# Patient Record
Sex: Male | Born: 1935
Health system: Southern US, Community
[De-identification: ages and names within clinical notes are randomized; demographics above are authoritative.]

## PROBLEM LIST (undated history)

## (undated) DIAGNOSIS — I498 Other specified cardiac arrhythmias: Secondary | ICD-10-CM

## (undated) DIAGNOSIS — C61 Malignant neoplasm of prostate: Secondary | ICD-10-CM

## (undated) DIAGNOSIS — I712 Thoracic aortic aneurysm, without rupture, unspecified: Secondary | ICD-10-CM

## (undated) DIAGNOSIS — I480 Paroxysmal atrial fibrillation: Secondary | ICD-10-CM

## (undated) DIAGNOSIS — C189 Malignant neoplasm of colon, unspecified: Secondary | ICD-10-CM

## (undated) DIAGNOSIS — I340 Nonrheumatic mitral (valve) insufficiency: Secondary | ICD-10-CM

## (undated) DIAGNOSIS — K469 Unspecified abdominal hernia without obstruction or gangrene: Secondary | ICD-10-CM

## (undated) DIAGNOSIS — H409 Unspecified glaucoma: Secondary | ICD-10-CM

## (undated) DIAGNOSIS — N183 Chronic kidney disease, stage 3 unspecified: Secondary | ICD-10-CM

## (undated) DIAGNOSIS — I10 Essential (primary) hypertension: Secondary | ICD-10-CM

## (undated) DIAGNOSIS — I493 Ventricular premature depolarization: Secondary | ICD-10-CM

## (undated) DIAGNOSIS — J392 Other diseases of pharynx: Secondary | ICD-10-CM

## (undated) DIAGNOSIS — D696 Thrombocytopenia, unspecified: Secondary | ICD-10-CM

## (undated) DIAGNOSIS — I2510 Atherosclerotic heart disease of native coronary artery without angina pectoris: Secondary | ICD-10-CM

## (undated) DIAGNOSIS — I441 Atrioventricular block, second degree: Secondary | ICD-10-CM

## (undated) DIAGNOSIS — I341 Nonrheumatic mitral (valve) prolapse: Secondary | ICD-10-CM

## (undated) DIAGNOSIS — M1990 Unspecified osteoarthritis, unspecified site: Secondary | ICD-10-CM

## (undated) DIAGNOSIS — I351 Nonrheumatic aortic (valve) insufficiency: Secondary | ICD-10-CM

## (undated) DIAGNOSIS — F329 Major depressive disorder, single episode, unspecified: Secondary | ICD-10-CM

## (undated) DIAGNOSIS — T8859XA Other complications of anesthesia, initial encounter: Secondary | ICD-10-CM

## (undated) DIAGNOSIS — T4145XA Adverse effect of unspecified anesthetic, initial encounter: Secondary | ICD-10-CM

## (undated) DIAGNOSIS — I491 Atrial premature depolarization: Secondary | ICD-10-CM

## (undated) DIAGNOSIS — I4510 Unspecified right bundle-branch block: Secondary | ICD-10-CM

## (undated) DIAGNOSIS — C320 Malignant neoplasm of glottis: Secondary | ICD-10-CM

## (undated) DIAGNOSIS — I6782 Cerebral ischemia: Secondary | ICD-10-CM

## (undated) DIAGNOSIS — I440 Atrioventricular block, first degree: Secondary | ICD-10-CM

## (undated) DIAGNOSIS — G479 Sleep disorder, unspecified: Secondary | ICD-10-CM

## (undated) DIAGNOSIS — G3184 Mild cognitive impairment, so stated: Secondary | ICD-10-CM

## (undated) DIAGNOSIS — E785 Hyperlipidemia, unspecified: Secondary | ICD-10-CM

## (undated) HISTORY — DX: Ventricular premature depolarization: I49.3

## (undated) HISTORY — DX: Paroxysmal atrial fibrillation: I48.0

## (undated) HISTORY — DX: Unspecified right bundle-branch block: I45.10

## (undated) HISTORY — DX: Unspecified abdominal hernia without obstruction or gangrene: K46.9

## (undated) HISTORY — DX: Hyperlipidemia, unspecified: E78.5

## (undated) HISTORY — PX: JOINT REPLACEMENT: SHX530

## (undated) HISTORY — DX: Other specified cardiac arrhythmias: I49.8

## (undated) HISTORY — DX: Nonrheumatic mitral (valve) prolapse: I34.1

## (undated) HISTORY — DX: Thoracic aortic aneurysm, without rupture: I71.2

## (undated) HISTORY — DX: Thrombocytopenia, unspecified: D69.6

## (undated) HISTORY — DX: Malignant neoplasm of prostate: C61

## (undated) HISTORY — DX: Atrial premature depolarization: I49.1

## (undated) HISTORY — DX: Nonrheumatic aortic (valve) insufficiency: I35.1

## (undated) HISTORY — DX: Thoracic aortic aneurysm, without rupture, unspecified: I71.20

## (undated) HISTORY — DX: Cerebral ischemia: I67.82

## (undated) HISTORY — PX: GASTROSTOMY W/ FEEDING TUBE: SUR642

## (undated) HISTORY — DX: Unspecified glaucoma: H40.9

## (undated) HISTORY — DX: Major depressive disorder, single episode, unspecified: F32.9

## (undated) HISTORY — DX: Mild cognitive impairment of uncertain or unknown etiology: G31.84

## (undated) HISTORY — DX: Atrioventricular block, second degree: I44.1

## (undated) HISTORY — DX: Atrioventricular block, first degree: I44.0

## (undated) HISTORY — DX: Chronic kidney disease, stage 3 (moderate): N18.3

## (undated) HISTORY — DX: Sleep disorder, unspecified: G47.9

## (undated) HISTORY — PX: PROSTATE BIOPSY: SHX241

## (undated) HISTORY — DX: Atherosclerotic heart disease of native coronary artery without angina pectoris: I25.10

## (undated) HISTORY — DX: Chronic kidney disease, stage 3 unspecified: N18.30

## (undated) HISTORY — PX: CATARACT EXTRACTION W/ INTRAOCULAR LENS  IMPLANT, BILATERAL: SHX1307

## (undated) HISTORY — DX: Essential (primary) hypertension: I10

## (undated) HISTORY — DX: Malignant neoplasm of colon, unspecified: C18.9

## (undated) HISTORY — DX: Nonrheumatic mitral (valve) insufficiency: I34.0

---

## 1965-08-10 HISTORY — PX: TYMPANOPLASTY: SHX33

## 1998-02-15 ENCOUNTER — Ambulatory Visit (HOSPITAL_COMMUNITY): Admission: RE | Admit: 1998-02-15 | Discharge: 1998-02-15 | Payer: Self-pay | Admitting: Orthopedic Surgery

## 2000-11-29 ENCOUNTER — Encounter: Payer: Self-pay | Admitting: Orthopedic Surgery

## 2000-11-29 ENCOUNTER — Inpatient Hospital Stay (HOSPITAL_COMMUNITY): Admission: RE | Admit: 2000-11-29 | Discharge: 2000-12-02 | Payer: Self-pay | Admitting: Orthopedic Surgery

## 2001-08-10 HISTORY — PX: TOTAL KNEE ARTHROPLASTY: SHX125

## 2002-08-10 HISTORY — PX: COLECTOMY: SHX59

## 2003-03-26 ENCOUNTER — Ambulatory Visit (HOSPITAL_COMMUNITY): Admission: RE | Admit: 2003-03-26 | Discharge: 2003-03-26 | Payer: Self-pay | Admitting: Gastroenterology

## 2003-03-26 ENCOUNTER — Encounter (INDEPENDENT_AMBULATORY_CARE_PROVIDER_SITE_OTHER): Payer: Self-pay | Admitting: Specialist

## 2003-04-05 ENCOUNTER — Encounter: Payer: Self-pay | Admitting: General Surgery

## 2003-04-05 ENCOUNTER — Encounter: Admission: RE | Admit: 2003-04-05 | Discharge: 2003-04-13 | Payer: Self-pay | Admitting: General Surgery

## 2003-04-05 ENCOUNTER — Encounter: Admission: RE | Admit: 2003-04-05 | Discharge: 2003-04-05 | Payer: Self-pay | Admitting: General Surgery

## 2003-04-09 ENCOUNTER — Encounter (INDEPENDENT_AMBULATORY_CARE_PROVIDER_SITE_OTHER): Payer: Self-pay

## 2003-04-09 ENCOUNTER — Inpatient Hospital Stay (HOSPITAL_COMMUNITY): Admission: RE | Admit: 2003-04-09 | Discharge: 2003-04-13 | Payer: Self-pay | Admitting: General Surgery

## 2003-06-03 ENCOUNTER — Ambulatory Visit (HOSPITAL_COMMUNITY): Admission: RE | Admit: 2003-06-03 | Discharge: 2003-06-03 | Payer: Self-pay | Admitting: General Surgery

## 2003-06-03 ENCOUNTER — Encounter: Payer: Self-pay | Admitting: General Surgery

## 2006-10-05 ENCOUNTER — Ambulatory Visit: Payer: Self-pay | Admitting: *Deleted

## 2008-11-06 ENCOUNTER — Encounter (INDEPENDENT_AMBULATORY_CARE_PROVIDER_SITE_OTHER): Payer: Self-pay | Admitting: Cardiology

## 2008-11-06 ENCOUNTER — Ambulatory Visit (HOSPITAL_COMMUNITY): Admission: RE | Admit: 2008-11-06 | Discharge: 2008-11-06 | Payer: Self-pay | Admitting: Cardiology

## 2008-12-06 ENCOUNTER — Ambulatory Visit: Payer: Self-pay | Admitting: Thoracic Surgery (Cardiothoracic Vascular Surgery)

## 2008-12-12 ENCOUNTER — Encounter
Admission: RE | Admit: 2008-12-12 | Discharge: 2008-12-12 | Payer: Self-pay | Admitting: Thoracic Surgery (Cardiothoracic Vascular Surgery)

## 2008-12-24 ENCOUNTER — Ambulatory Visit: Payer: Self-pay | Admitting: Thoracic Surgery (Cardiothoracic Vascular Surgery)

## 2008-12-27 HISTORY — PX: MITRAL VALVE REPAIR: SHX2039

## 2008-12-27 HISTORY — PX: MAZE: SHX5063

## 2009-01-02 ENCOUNTER — Encounter: Payer: Self-pay | Admitting: Thoracic Surgery (Cardiothoracic Vascular Surgery)

## 2009-01-04 ENCOUNTER — Encounter: Payer: Self-pay | Admitting: Thoracic Surgery (Cardiothoracic Vascular Surgery)

## 2009-01-04 ENCOUNTER — Ambulatory Visit: Payer: Self-pay | Admitting: Thoracic Surgery (Cardiothoracic Vascular Surgery)

## 2009-01-04 ENCOUNTER — Inpatient Hospital Stay (HOSPITAL_COMMUNITY)
Admission: RE | Admit: 2009-01-04 | Discharge: 2009-01-08 | Payer: Self-pay | Admitting: Thoracic Surgery (Cardiothoracic Vascular Surgery)

## 2009-01-06 ENCOUNTER — Ambulatory Visit (HOSPITAL_COMMUNITY)
Admission: RE | Admit: 2009-01-06 | Discharge: 2009-01-06 | Payer: Self-pay | Admitting: Thoracic Surgery (Cardiothoracic Vascular Surgery)

## 2009-01-14 ENCOUNTER — Encounter
Admission: RE | Admit: 2009-01-14 | Discharge: 2009-01-14 | Payer: Self-pay | Admitting: Thoracic Surgery (Cardiothoracic Vascular Surgery)

## 2009-01-14 ENCOUNTER — Ambulatory Visit: Payer: Self-pay | Admitting: Thoracic Surgery (Cardiothoracic Vascular Surgery)

## 2009-02-01 ENCOUNTER — Ambulatory Visit: Payer: Self-pay | Admitting: Thoracic Surgery (Cardiothoracic Vascular Surgery)

## 2009-04-01 ENCOUNTER — Ambulatory Visit: Payer: Self-pay | Admitting: Thoracic Surgery (Cardiothoracic Vascular Surgery)

## 2009-06-17 ENCOUNTER — Ambulatory Visit: Payer: Self-pay | Admitting: Thoracic Surgery (Cardiothoracic Vascular Surgery)

## 2009-12-16 ENCOUNTER — Ambulatory Visit: Payer: Self-pay | Admitting: Thoracic Surgery (Cardiothoracic Vascular Surgery)

## 2010-06-02 ENCOUNTER — Ambulatory Visit: Payer: Self-pay | Admitting: Thoracic Surgery (Cardiothoracic Vascular Surgery)

## 2010-08-30 ENCOUNTER — Encounter: Payer: Self-pay | Admitting: General Surgery

## 2010-11-17 LAB — PROTIME-INR
INR: 1 (ref 0.00–1.49)
Prothrombin Time: 13.8 seconds (ref 11.6–15.2)

## 2010-11-17 LAB — BASIC METABOLIC PANEL
Chloride: 103 mEq/L (ref 96–112)
GFR calc non Af Amer: 58 mL/min — ABNORMAL LOW (ref >60)
Potassium: 4.6 mEq/L (ref 3.5–5.1)
Sodium: 136 mEq/L (ref 135–145)

## 2010-11-18 LAB — URINALYSIS, ROUTINE W REFLEX MICROSCOPIC
Glucose, UA: NEGATIVE mg/dL
Protein, ur: NEGATIVE mg/dL
Urobilinogen, UA: 1 mg/dL (ref 0.0–1.0)

## 2010-11-18 LAB — POCT I-STAT 4, (NA,K, GLUC, HGB,HCT)
Glucose, Bld: 119 mg/dL — ABNORMAL HIGH (ref 70–99)
Glucose, Bld: 121 mg/dL — ABNORMAL HIGH (ref 70–99)
Glucose, Bld: 124 mg/dL — ABNORMAL HIGH (ref 70–99)
Glucose, Bld: 132 mg/dL — ABNORMAL HIGH (ref 70–99)
Glucose, Bld: 132 mg/dL — ABNORMAL HIGH (ref 70–99)
Glucose, Bld: 149 mg/dL — ABNORMAL HIGH (ref 70–99)
Glucose, Bld: 62 mg/dL — ABNORMAL LOW (ref 70–99)
Glucose, Bld: 96 mg/dL (ref 70–99)
HCT: 26 % — ABNORMAL LOW (ref 39.0–52.0)
HCT: 26 % — ABNORMAL LOW (ref 39.0–52.0)
HCT: 27 % — ABNORMAL LOW (ref 39.0–52.0)
HCT: 30 % — ABNORMAL LOW (ref 39.0–52.0)
HCT: 38 % — ABNORMAL LOW (ref 39.0–52.0)
Hemoglobin: 10.2 g/dL — ABNORMAL LOW (ref 13.0–17.0)
Hemoglobin: 12.9 g/dL — ABNORMAL LOW (ref 13.0–17.0)
Hemoglobin: 8.8 g/dL — ABNORMAL LOW (ref 13.0–17.0)
Hemoglobin: 8.8 g/dL — ABNORMAL LOW (ref 13.0–17.0)
Hemoglobin: 9.2 g/dL — ABNORMAL LOW (ref 13.0–17.0)
Potassium: 3.4 mEq/L — ABNORMAL LOW (ref 3.5–5.1)
Potassium: 3.9 meq/L (ref 3.5–5.1)
Potassium: 4.3 mEq/L (ref 3.5–5.1)
Potassium: 4.4 mEq/L (ref 3.5–5.1)
Potassium: 4.4 mEq/L (ref 3.5–5.1)
Potassium: 4.6 mEq/L (ref 3.5–5.1)
Potassium: 5.2 mEq/L — ABNORMAL HIGH (ref 3.5–5.1)
Sodium: 134 mEq/L — ABNORMAL LOW (ref 135–145)
Sodium: 137 mEq/L (ref 135–145)
Sodium: 139 mEq/L (ref 135–145)
Sodium: 139 mEq/L (ref 135–145)
Sodium: 142 meq/L (ref 135–145)
Sodium: 143 mEq/L (ref 135–145)

## 2010-11-18 LAB — PROTIME-INR
INR: 1.5 (ref 0.00–1.49)
Prothrombin Time: 13 seconds (ref 11.6–15.2)
Prothrombin Time: 14.6 seconds (ref 11.6–15.2)
Prothrombin Time: 17.8 seconds — ABNORMAL HIGH (ref 11.6–15.2)

## 2010-11-18 LAB — POCT I-STAT 3, ART BLOOD GAS (G3+)
Acid-Base Excess: 3 mmol/L — ABNORMAL HIGH (ref 0.0–2.0)
Acid-base deficit: 1 mmol/L (ref 0.0–2.0)
Acid-base deficit: 4 mmol/L — ABNORMAL HIGH (ref 0.0–2.0)
Bicarbonate: 23.7 mEq/L (ref 20.0–24.0)
Bicarbonate: 28.4 mEq/L — ABNORMAL HIGH (ref 20.0–24.0)
O2 Saturation: 100 %
O2 Saturation: 100 %
O2 Saturation: 86 %
TCO2: 21 mmol/L (ref 0–100)
TCO2: 27 mmol/L (ref 0–100)
TCO2: 30 mmol/L (ref 0–100)
pCO2 arterial: 29.3 mmHg — ABNORMAL LOW (ref 35.0–45.0)
pCO2 arterial: 42.5 mmHg (ref 35.0–45.0)
pCO2 arterial: 48.5 mmHg — ABNORMAL HIGH (ref 35.0–45.0)
pH, Arterial: 7.386 (ref 7.350–7.450)
pO2, Arterial: 256 mmHg — ABNORMAL HIGH (ref 80.0–100.0)
pO2, Arterial: 275 mmHg — ABNORMAL HIGH (ref 80.0–100.0)
pO2, Arterial: 313 mmHg — ABNORMAL HIGH (ref 80.0–100.0)
pO2, Arterial: 83 mmHg (ref 80.0–100.0)

## 2010-11-18 LAB — PREPARE PLATELETS

## 2010-11-18 LAB — COMPREHENSIVE METABOLIC PANEL WITH GFR
ALT: 24 U/L (ref 0–53)
AST: 28 U/L (ref 0–37)
Albumin: 3.9 g/dL (ref 3.5–5.2)
Alkaline Phosphatase: 68 U/L (ref 39–117)
BUN: 12 mg/dL (ref 6–23)
CO2: 20 meq/L (ref 19–32)
Calcium: 9.4 mg/dL (ref 8.4–10.5)
Chloride: 112 meq/L (ref 96–112)
Creatinine, Ser: 1.11 mg/dL (ref 0.4–1.5)
GFR calc Af Amer: >60 mL/min (ref >60)
GFR calc non Af Amer: >60 mL/min (ref >60)
Glucose, Bld: 110 mg/dL — ABNORMAL HIGH (ref 70–99)
Potassium: 4.4 meq/L (ref 3.5–5.1)
Sodium: 140 meq/L (ref 135–145)
Total Bilirubin: 1.1 mg/dL (ref 0.3–1.2)
Total Protein: 7.1 g/dL (ref 6.0–8.3)

## 2010-11-18 LAB — CBC
HCT: 31.9 % — ABNORMAL LOW (ref 39.0–52.0)
HCT: 45.7 % (ref 39.0–52.0)
Hemoglobin: 10.7 g/dL — ABNORMAL LOW (ref 13.0–17.0)
Hemoglobin: 11.4 g/dL — ABNORMAL LOW (ref 13.0–17.0)
Hemoglobin: 15.8 g/dL (ref 13.0–17.0)
MCHC: 34.5 g/dL (ref 30.0–36.0)
MCHC: 34.7 g/dL (ref 30.0–36.0)
MCV: 97.4 fL (ref 78.0–100.0)
MCV: 97.8 fL (ref 78.0–100.0)
MCV: 98.1 fL (ref 78.0–100.0)
Platelets: 109 10*3/uL — ABNORMAL LOW (ref 150–400)
Platelets: 166 K/uL (ref 150–400)
Platelets: 94 10*3/uL — ABNORMAL LOW (ref 150–400)
RBC: 3.17 MIL/uL — ABNORMAL LOW (ref 4.22–5.81)
RBC: 3.44 MIL/uL — ABNORMAL LOW (ref 4.22–5.81)
RBC: 4.69 MIL/uL (ref 4.22–5.81)
RDW: 13.3 % (ref 11.5–15.5)
RDW: 13.3 % (ref 11.5–15.5)
RDW: 13.9 % (ref 11.5–15.5)
WBC: 10 10*3/uL (ref 4.0–10.5)
WBC: 10.1 10*3/uL (ref 4.0–10.5)
WBC: 12.8 10*3/uL — ABNORMAL HIGH (ref 4.0–10.5)
WBC: 8.6 K/uL (ref 4.0–10.5)

## 2010-11-18 LAB — POCT I-STAT, CHEM 8
Calcium, Ion: 1.1 mmol/L — ABNORMAL LOW (ref 1.12–1.32)
Chloride: 106 mEq/L (ref 96–112)
Creatinine, Ser: 1.2 mg/dL (ref 0.4–1.5)
Glucose, Bld: 160 mg/dL — ABNORMAL HIGH (ref 70–99)
HCT: 33 % — ABNORMAL LOW (ref 39.0–52.0)
Hemoglobin: 11.2 g/dL — ABNORMAL LOW (ref 13.0–17.0)

## 2010-11-18 LAB — BLOOD GAS, ARTERIAL
Acid-base deficit: 3.2 mmol/L — ABNORMAL HIGH (ref 0.0–2.0)
Bicarbonate: 20.2 meq/L (ref 20.0–24.0)
Drawn by: 181601
FIO2: 0.21 %
O2 Saturation: 98.1 %
Patient temperature: 98.6
TCO2: 21.1 mmol/L (ref 0–100)
pCO2 arterial: 30.1 mmHg — ABNORMAL LOW (ref 35.0–45.0)
pH, Arterial: 7.442 (ref 7.350–7.450)
pO2, Arterial: 94.1 mmHg (ref 80.0–100.0)

## 2010-11-18 LAB — BASIC METABOLIC PANEL
BUN: 15 mg/dL (ref 6–23)
BUN: 17 mg/dL (ref 6–23)
Calcium: 8 mg/dL — ABNORMAL LOW (ref 8.4–10.5)
Calcium: 8.1 mg/dL — ABNORMAL LOW (ref 8.4–10.5)
Chloride: 105 mEq/L (ref 96–112)
Chloride: 106 mEq/L (ref 96–112)
Creatinine, Ser: 1.34 mg/dL (ref 0.4–1.5)
Creatinine, Ser: 1.52 mg/dL — ABNORMAL HIGH (ref 0.4–1.5)
GFR calc Af Amer: >60 mL/min (ref >60)
GFR calc Af Amer: >60 mL/min (ref >60)
GFR calc non Af Amer: >60 mL/min (ref >60)
GFR calc non Af Amer: >60 mL/min (ref >60)
Glucose, Bld: 133 mg/dL — ABNORMAL HIGH (ref 70–99)
Glucose, Bld: 166 mg/dL — ABNORMAL HIGH (ref 70–99)
Potassium: 3.6 mEq/L (ref 3.5–5.1)
Sodium: 136 mEq/L (ref 135–145)
Sodium: 141 mEq/L (ref 135–145)

## 2010-11-18 LAB — PREPARE FRESH FROZEN PLASMA

## 2010-11-18 LAB — GLUCOSE, CAPILLARY
Glucose-Capillary: 105 mg/dL — ABNORMAL HIGH (ref 70–99)
Glucose-Capillary: 133 mg/dL — ABNORMAL HIGH (ref 70–99)
Glucose-Capillary: 137 mg/dL — ABNORMAL HIGH (ref 70–99)
Glucose-Capillary: 137 mg/dL — ABNORMAL HIGH (ref 70–99)
Glucose-Capillary: 138 mg/dL — ABNORMAL HIGH (ref 70–99)
Glucose-Capillary: 144 mg/dL — ABNORMAL HIGH (ref 70–99)
Glucose-Capillary: 167 mg/dL — ABNORMAL HIGH (ref 70–99)

## 2010-11-18 LAB — ABO/RH: ABO/RH(D): O POS

## 2010-11-18 LAB — POCT I-STAT 3, VENOUS BLOOD GAS (G3P V)
Acid-base deficit: 4 mmol/L — ABNORMAL HIGH (ref 0.0–2.0)
Bicarbonate: 22 meq/L (ref 20.0–24.0)
O2 Saturation: 81 %
TCO2: 23 mmol/L (ref 0–100)
pCO2, Ven: 41.4 mmHg — ABNORMAL LOW (ref 45.0–50.0)
pH, Ven: 7.333 — ABNORMAL HIGH (ref 7.250–7.300)
pO2, Ven: 49 mmHg — ABNORMAL HIGH (ref 30.0–45.0)

## 2010-11-18 LAB — APTT: aPTT: 36 seconds (ref 24–37)

## 2010-11-18 LAB — TYPE AND SCREEN
ABO/RH(D): O POS
Antibody Screen: NEGATIVE

## 2010-11-18 LAB — PLATELET COUNT: Platelets: 88 10*3/uL — ABNORMAL LOW (ref 150–400)

## 2010-11-18 LAB — HEMOGLOBIN AND HEMATOCRIT, BLOOD: HCT: 28.3 % — ABNORMAL LOW (ref 39.0–52.0)

## 2010-11-18 LAB — MAGNESIUM: Magnesium: 2.3 mg/dL (ref 1.5–2.5)

## 2010-11-24 ENCOUNTER — Encounter (INDEPENDENT_AMBULATORY_CARE_PROVIDER_SITE_OTHER): Payer: Medicare Other | Admitting: Thoracic Surgery (Cardiothoracic Vascular Surgery)

## 2010-11-24 DIAGNOSIS — I4891 Unspecified atrial fibrillation: Secondary | ICD-10-CM

## 2010-11-24 DIAGNOSIS — I059 Rheumatic mitral valve disease, unspecified: Secondary | ICD-10-CM

## 2010-11-25 NOTE — Assessment & Plan Note (Signed)
OFFICE VISIT  Timothy Suarez, Timothy Suarez DOB:  02/26/1925                                        November 24, 2010 CHART #:  [id number]  HISTORY OF PRESENT ILLNESS:  The patient returns for routine followup now almost 2 years, status post right miniature thoracotomy for mitral valve repair and maze procedure.  He was last seen here in the office on June 02, 2010.  At that time, he was noted to have developed a small chest wall hernia at the site of his mini thoracotomy scar.  He was completely asymptomatic and did not want to pursue it any further at that time.  Since then, the patient has continued to do quite well physically.  He denies any shortness of breath either with activity or at rest.  His activity level is quite good.  He has not had any significant tachy palpitations that he is aware of.  He has not sort a new cardiologist since Dr. Amil Amen left [facility], although he does continue to follow up with his medical doctor, Dr. Gweneth Dimitri.  He has no complaints.  The remainder of his past medical history is unchanged.  CURRENT MEDICATIONS:  Simvastatin, Norvasc, zolpidem, Travatan eye drops, and vitamin D.  PHYSICAL EXAMINATION:  General:  Notable for a well-appearing male. Vital Signs:  Blood pressure 119/73, pulse 88 and regular.  Two channel telemetry rhythm strip demonstrates normal sinus rhythm.  Oxygen saturation is 96% on room air.  Chest:  Examination of the chest reveals clear breath sounds that are symmetrical bilaterally.  No wheezes, rales or rhonchi are noted.  There is an obvious chest wall hernia at the site of the previous mini thoracotomy incision.  I think the size of the defect maybe somewhat larger.  Cardiovascular:  Regular rate and rhythm. No murmurs, rubs or gallops are noted.  Abdomen:  Soft and nontender. Extremities:  Warm and well-perfused.  There is no lower extremity edema.  IMPRESSION:  The patient is doing  exceptionally well from a cardiovascular standpoint.  He appears to be maintaining sinus rhythm. He has developed a chest wall hernia at the site of his previous mini thoracotomy scar, and I think this may have gotten larger over the last 6 months.  He remains completely asymptomatic and this should be without risk for incarceration.  PLAN:  I have encouraged the patient to consider repair of this hernia given the fact that it seems to be enlarging in size.  He is not interested in having surgery at this time and desires to hold off and just continue to watch it.  I have also reminded that he should probably seek long-term followup with a cardiologist so that he can undergo periodic echocardiogram given his history of previous mitral valve repair and maze procedure.  At this point, he is doing quite well.  All of his questions have been addressed.  He will return in 6 months' time or sooner if his chest wall hernia becomes more problematic, larger in size, or associated with pain.  Salvatore Decent. Cornelius Moras, M.D. Electronically Signed  CHO/MEDQ  D:  11/24/2010  T:  11/25/2010  Job:  [id number]  cc:   Pam Drown, M.D.

## 2010-12-23 NOTE — Assessment & Plan Note (Signed)
OFFICE VISIT   Timothy Suarez, Timothy Suarez  DOB:  [birth year]                                        June 17, 2009  CHART #:  [id number]   HISTORY OF PRESENT ILLNESS:  The patient returns to the office today for  routine follow up now nearly 6 months status post right miniature  thoracotomy for mitral valve repair and Cox CryoMaze procedure.  He was  last seen here in the office on April 01, 2009.  Since then, he has  continued to do well, although he still complains of intermittent dizzy  spells.  He states that these seem to be more problematic when he is  more physically active, and he particularly notices it when he stands up  suddenly after being recumbent for prolonged period of time.  He has  never had any syncopal episodes.  He never had any chest pain or  shortness of breath.  He has never had any transient unilateral numbness  or weakness or visual disturbances.  Since his last visit, he underwent  an outpatient Holter monitor that revealed no signs of any atrial  arrhythmias and a remarkably stable heart rate with normal variation  during the day.  Followup echocardiogram performed on April 25, 2009, revealed normal left ventricular systolic function with ejection  fraction estimated 55-60%.  The mitral valve was functioning normally  with only mild mitral regurgitation and no significant mitral stenosis.  No other abnormalities were noted.  Since then, the patient has been  taken off Coumadin.  Overall, he is getting along fairly well.  He does  report that his dizzy spells seem to predate the surgery that he had  last spring, but he does note that they been increased in severity since  then.  He otherwise feels fine.  His appetite is normal.  He is sleeping  well at night.  He has no shortness of breath.  The remainder of his  review of systems is unremarkable.   CURRENT MEDICATIONS:  1. Sular 10 mg daily.  2. Aspirin 325 mg daily.  3.  Simvastatin 80 mg daily.  4. Ambien as needed for sleep.   PHYSICAL EXAMINATION:  Notable for well-appearing male with blood  pressure 125/80, pulse 76.  Two-channel telemetry rhythm strip  demonstrates normal sinus rhythm.  Examination of the chest reveals  clear breath sounds which are symmetrical bilaterally.  No wheezes or  rhonchi are noted.  Cardiovascular exam includes regular rate and  rhythm.  No murmurs, rubs, or gallops are appreciated.  The abdomen is  soft, nontender.  The extremities are warm and well perfused.  There is  no lower extremity edema.  The remainder of his physical exam is  unrevealing.   IMPRESSION:  The patient looks quite well and seems to be getting along  fine, although he continues to complain of intermittent dizzy spells.  His recent Holter monitor and echocardiogram both look excellent with no  sign of recurrent atrial arrhythmias and a normal functioning mitral  valve with normal left ventricular function.   PLAN:  I have no further insight as to what the underlying cause of his  intermittent dizzy spells might be.  It may be that a formal neurologic  evaluation would be appropriate, and it seems as though his dizzy spells  are probably not cardiac  in origin.  With respect to his previous mitral  valve surgery and maze procedure, he seems to be doing well.  We will  plan to see him back for further follow up in routine rhythm check in 6  months.   Timothy Suarez, M.D.  Electronically Signed   CHO/MEDQ  D:  06/17/2009  T:  06/18/2009  Job:  [id number]   cc:   Timothy Suarez, M.D.  Timothy Suarez, M.D.  Timothy Suarez, M.D.

## 2010-12-23 NOTE — Assessment & Plan Note (Signed)
OFFICE VISIT   Timothy Suarez, Timothy Suarez  DOB:  1924/12/09                                        February 01, 2009  CHART #:  [id number]   The patient returns to the office today for further followup, status  post right miniature thoracotomy for mitral valve repair and Cox-  CryoMaze procedure on Jan 04, 2009.  He was last seen here in the office  on January 14, 2009.  Since then he has continued to improve.  He does  states that he still has trouble sleeping at night and he still feels  tired and gets tired easily.  However, he otherwise is doing very well.  He has no shortness of breath.  He has no dizzy spells.  He has no  tachypalpitations.  He has had no pain at all and he is not taking any  pain medication.  His appetite is good.  His activity level is fairly  good except for the fact that he states that he gets tired quickly.  His  biggest problem is he is not sleeping at night.  He has not yet been  seen in followup by Dr. Amil Amen.  He has had his prothrombin time  checked and his Coumadin dose monitored.   PHYSICAL EXAMINATION:  GENERAL:  A well-appearing male.  VITAL SIGNS:  Blood pressure 133/81, pulse 83 and regular, and oxygen  saturation 98%.  CHEST:  All of his incisions are healing very nicely.  LUNGS:  Breath sounds are clear to auscultation and symmetrical  bilaterally.  CARDIOVASCULAR:  Regular rate and rhythm.  No murmurs, rubs, or gallops  are noted.  ABDOMEN:  Soft and nontender.  EXTREMITIES:  Warm and well perfused.  There is no lower extremity  edema.   IMPRESSION:  The patient looks quite good.  His biggest complaint is  that he is not sleeping at night and he still gets tired easily.  He has  had no shortness of breath whatsoever and overall he feels well.   PLAN:  I have instructed the patient to go ahead and take Ambien as many  as 2 tablets as needed at bedtime for sleep.  I think that during the  day, he can resume driving an  automobile, I have encouraged him to get  start in the cardiac rehab program.  At this point he has no significant  physical restrictions and he can increase his activity as tolerated.  He  has asked about swimming and I think that would be fine as well.  He has  asked about Coumadin and I suspect that he should remain on Coumadin for  at least another month or two.  After that we can consider stopping  Coumadin if he is maintaining sinus rhythm off of amiodarone.   Salvatore Decent. Cornelius Moras, M.D.  Electronically Signed   CHO/MEDQ  D:  02/01/2009  T:  02/02/2009  Job:  [id number]   cc:   Francisca December, M.D.  Pam Drown, M.D.

## 2010-12-23 NOTE — Consult Note (Signed)
NEW PATIENT CONSULTATION   Timothy Suarez, Timothy Suarez  DOB:  [birth year]                                        December 06, 2008  CHART #:  [id number]   REQUESTING PHYSICIAN:  Francisca December, MD   PRIMARY CARE PHYSICIAN:  Pam Drown, MD   REASON FOR CONSULTATION:  Severe mitral regurgitation.   HISTORY OF PRESENT ILLNESS:  The patient is an 77 year old retired  gentleman from [place] with recently discovered severe mitral  regurgitation.  The patient had previously been found to have mild  aortic insufficiency and nonobstructive coronary artery disease for  which he had been treated medically.  He had been active physically for  most of his life, but over the last couple of years he has suffered from  some progressive exertional fatigue.  He also developed intermittent  episodes of tachy palpitations and ultimately was diagnosed with  paroxysmal atrial fibrillation.  The patient recently developed  exertional shortness of breath.  He was seen in followup by Dr. Corliss Blacker  and noted to have a new murmur on physical exam.  A followup  echocardiogram was performed demonstrating severe mitral regurgitation.  He was referred to Dr. Amil Amen, and a transesophageal echocardiogram was  performed on November 06, 2008.  This revealed normal left ventricular size  and systolic function with ejection fraction estimated 60%.  There was  mitral valve prolapse with an eccentric jet of severe mitral  regurgitation directed into the left atrial appendage and associated  flail segment of the middle scallop of the posterior leaflet of the  mitral valve.  There remained mild-to-moderate aortic regurgitation.  No  other abnormalities were noted.  The patient subsequently underwent left  and right heart catheterization on October 31, 2008, by Dr. Amil Amen.  This  revealed single-vessel coronary artery disease with calcified stenosis  of the mid left anterior descending coronary artery and  otherwise no  significant coronary artery disease.  Pulmonary artery pressures were  mildly elevated at 29/8, but on wedge there was a large V-wave of 35  mmHg.  There was only mild aortic regurgitation but severe mitral  regurgitation.  No other abnormalities were noted.  The patient has been  referred to consider minimally invasive mitral valve repair.  Of note,  following completion of his transesophageal echocardiogram, he did have  an episode of paroxysmal atrial fibrillation that terminated  spontaneously.   REVIEW OF SYSTEMS:  GENERAL:  The patient reports normal appetite and  stable weight.  He is 6 feet tall and weighs 210 pounds.  He does  complain of progressive exertional fatigue over the last 2 years or so.  CARDIAC:  Notable for the absence of any history of chest pain, chest  tightness, chest pressure either with activity or at rest.  The patient  has recently developed exertional shortness of breath.  He has had  intermittent episodes of tachy palpitations including one that occurred  just after transesophageal echocardiogram several weeks ago which was  captured and notably paroxysmal atrial fibrillation.  He thinks that he  has had 3 or 4 similar episodes of tachy palpitations in the past.  The  patient denies PND, orthopnea, or lower extremity edema.  RESPIRATORY:  Negative.  The patient denies productive cough,  hemoptysis, wheezing.  GASTROINTESTINAL:  Negative.  The patient has no difficulty swallowing.  He reports normal bowel function.  He denies hematochezia, hematemesis,  melena.  GENITOURINARY:  Negative.  MUSCULOSKELETAL:  Notable for mild arthritis and arthralgias that seems  to have gotten a bit worse recently but nonetheless does not slow him  down too much.  NEUROLOGIC:  Notable in that the patient does have intermittent mild  dizzy spells as well as some notable slow decline in short-term memory  with intermittent problems difficulty with short-term  memory that  bothers him at times.  He still remains entirely active and independent  and this has not limited his lifestyle in any way yet.  He did have one  transient episode of shakiness and dizziness that occurred while he was  visiting his daughter in [place] in February 2010.  He was evaluated  in the emergency room at that time and told that he might have had a  mild TIA.  PSYCHIATRIC:  Notable for some history of depression in the past.  HEENT:  Negative.  MUSCULOSKELETAL:  Notable for bilateral arthritis or arthralgias,  particularly afflicting both knees.   PAST SURGICAL HISTORY:  1. Partial colectomy in 2004 for early-stage colon cancer.  2. Tympanoplasty in the distant past.   FAMILY HISTORY:  Noncontributory.   SOCIAL HISTORY:  The patient is a widow who lives alone here in  [place].  He has 2 grown children, one who lives nearby, the other  who lives in [place].  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  He has been retired for more than 20 years.  He is a  nonsmoker.  He drinks approximately 2 or 3 alcoholic beverages every  evening.   CURRENT MEDICATIONS:  1. Aspirin 325 mg daily.  2. Nisoldipine 20 mg daily.  3. Simvastatin 80 mg daily.  4. Benazepril 40 mg daily.  5. Ambien 5 mg as needed for sleep.   DRUG ALLERGIES:  None known.   PHYSICAL EXAMINATION:  The patient is a well-appearing gentleman who  appears his stated age in no acute distress.  His pulse is 16 regular.  His blood pressure is 144/78, oxygen saturation 96% on room air.  HEENT  exam is grossly unrevealing.  The neck is supple.  There is no cervical  or supraclavicular lymphadenopathy.  There is no jugular venous  distention.  No carotid bruits are noted.  Auscultation of the chest  reveals clear breath sounds which are symmetrical bilaterally.  No  wheezes or rhonchi are noted.  Cardiovascular exam  demonstrates regular  rate and rhythm.  There is a very prominent loud grade 4/6 holosystolic  murmur heard best at the apex with radiation to the axilla back and all  across the precordium.  I do not appreciate a diastolic murmur.  The  abdomen is slightly protuberant but otherwise nondistended, nontender.  There are no palpable masses.  Bowel sounds are present.  Femoral pulses  are palpable bilaterally.  The patient recently had catheterization via  the right femoral artery approach and appears to not have any  complications.  Distal pulses are slightly diminished at the ankle but  palpable in the dorsalis pedis position.  Rectal and GU exams are both  deferred.  Neurologic examination is grossly nonfocal and symmetrical  throughout.   DIAGNOSTIC TESTS:  Transesophageal echocardiogram performed by Dr.  Amil Amen on November 06, 2008, is reviewed.  This demonstrates normal left  ventricular size  and systolic function.  There is mitral valve prolapse  with a flail segment of the posterior leaflet and severe (4+) mitral  regurgitation.  The regurgitation jet courses eccentrically around the  left atrium.  The aortic valve is tricuspid.  There is mild to moderate  aortic insufficiency with an eccentric jet that courses along the  ventricular surface of the anterior leaflet of mitral valve.  There do  not appear to be any leaflet fenestrations in the aortic valve which is  tricuspid.  There is no aortic stenosis, and the valves open and close  well.  There does not appear to be any prolapse of the aortic valve.  The jet of aortic regurgitation does not extend more than on third of  the way back into the left ventricle.  No other significant findings are  noted.   Left and right heart catheterization performed on October 31, 2008, is  reviewed.  This demonstrates moderate stenosis of the mid left anterior  descending coronary artery.  I am not impressed by the severity, and  feel that it is  perhaps 50-60% stenosed in this region at most.  There  is some calcification in this region.  There is otherwise no significant  coronary artery disease appreciated in the remainder of the coronary  vessels.  There is right dominant coronary circulation.  There is severe  mitral regurgitation.  There appears to be mild (1+) aortic  regurgitation.  Right heart pressures are as discussed previously.   IMPRESSION:  Mitral valve prolapse with severe mitral regurgitation,  progressive symptoms of exertional shortness of breath as well as  intermittent tachy palpitations and episodes of paroxysmal atrial  fibrillation.  I believe the patient would best be treated with elective  mitral valve repair.  Cardiac catheterization does reveal somewhere  between 50, 60, or perhaps 70% stenosis of the left anterior descending  coronary artery.  I am not impressed by the radiographic appearance of  this stenosis, and I am not sure that it is functionally significant.  Placement of left internal mammary artery graft at the time of open  heart surgery could certainly be done, but I would be concerned about  the possibility of atresia of this graft due to competitive flow.  Perhaps a nuclear perfusion stress test might be beneficial to rule out  the presence of functionally significant stenosis that could result in  anterior wall ischemia during stress.  The patient does not have any  symptoms suggestive of angina.  If concommitant coronary artery bypass  grafting is not felt to be neccessary, the patient might be an  acceptable candidate for minimally invasive approach for mitral valve  repair.  The patient does have intermittent episodes of paroxysmal  atrial fibrillation and might benefit from concomitant maze procedure at  the time of surgery.   PLAN:  I have discussed matters at length with the patient here in the  office today.  The rationale for proceeding with elective mitral valve  repair in the  reasonably near future has been discussed.  All of his  questions have been addressed.  I have discussed his case at length over  the telephone with Dr. Amil Amen, who plans nuclear stress test in the  near future to further characterize whether or not the moderate stenosis  of left anterior descending coronary artery is functionally significant.  If not, we could consider minimally invasive approach for elective  mitral valve repair with possible concomitant maze procedure.  Of note,  the presence of mild-to-moderate aortic insufficiency might increase his  risk for conversion to open surgical approach, but nonetheless this does  not preclude a minimally invasive approach.  Finally, the severity of  aortic regurgitation clearly does not appear to be bad enough to require  aortic valve repair or replacement.  We will plan to see the patient  back in the office in 3 weeks.  We will obtain CT angiogram of the  thoracic and abdominal aorta due to the presence of somewhat diminished  pulses to rule out significant peripheral arterial disease that might  preclude safe femoral artery cannulation at the time of surgery.   Salvatore Decent. Cornelius Moras, M.D.  Electronically Signed   CHO/MEDQ  D:  12/06/2008  T:  12/07/2008  Job:  [id number]   cc:   Francisca December, M.D.  Pam Drown, M.D.

## 2010-12-23 NOTE — Discharge Summary (Signed)
NAME:  Timothy Suarez, Timothy Suarez NO.:  [id number]   MEDICAL RECORD NO.:  [id number]          PATIENT TYPE:  INP   LOCATION:  2018                         FACILITY:  [facility]   PHYSICIAN:  Salvatore Decent. Cornelius Moras, M.D. DATE OF BIRTH:  1924/12/13   DATE OF ADMISSION:  01/04/2009  DATE OF DISCHARGE:  01/08/2009                               DISCHARGE SUMMARY   HISTORY:  The patient is an 77 year old retired gentleman who was  recently discovered to have severe mitral regurgitation.  The patient  had previously been found in the past to have mild aortic insufficiency  as well as nonobstructive coronary artery disease for which he has been  treated medically.  The patient has been active physically for most of  his life, but over the past couple of years has suffered from  progressive exertional fatigue.  He has also developed intermittent  episodes of tachypalpitations and has subsequently been diagnosed with  paroxysmal atrial fibrillation.  Most recently, the patient has  developed increased exertional shortness of breath.  He was seen in  followup by Dr. Corliss Blacker and noted to have a new murmur on physical exam.  Echocardiogram was performed and this demonstrated severe mitral  regurgitation.  He was referred to Dr. Amil Amen and underwent  transesophageal echocardiogram on November 06, 2008.  This study revealed  normal left ventricular size and systolic function with an ejection  fraction estimated at 60%.  There was mitral valve prolapse with an  eccentric jet of severe mitral regurgitation directed into the left  atrial appendage and an associated flail segment in the middle scalp of  the posterior leaflet of the mitral valve.  The patient also was noted  to have mild-to-moderate aortic regurgitation.  No other significant  abnormalities were noted.  Subsequent to this, the patient has undergone  cardiac catheterization on October 31, 2008, by Dr. Amil Amen.  This  revealed single vessel  coronary artery disease with a calcified stenosis  in the mid LAD and otherwise no significant coronary artery disease.  Pulmonary artery pressures were mildly elevated at 29/8, but on wedge,  there was a large V-wave at 35 mmHg.  There was only mild aortic  regurgitation, but severe mitral regurgitation.  No other abnormalities  were noted.  The patient was referred to Tressie Stalker, MD for  consideration of mitral valve repair.  The patient was evaluated by Dr.  Cornelius Moras as was his studies, and the patient was felt to be a candidate for  minimally invasive mitral valve repair and he was admitted this  hospitalization for the procedure.   PAST MEDICAL HISTORY:  As described above.   PAST SURGICAL HISTORY:  1. Partial colectomy in 2004 for early stage colon carcinoma.  2. Tympanoplasty in the distant past.   FAMILY HISTORY:  Noncontributory.   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  He has been retired for more  than 20 years.  He is a nonsmoker.  He drinks approximately 2-3  alcoholic beverages per evening.   MEDICATIONS PRIOR TO  ADMISSION:  1. Aspirin 325 mg daily.  2. Nisoldipine 20 mg daily.  3. Simvastatin 80 mg daily.  4. Benazepril 40 mg daily.  5. Ambien 5 mg p.r.n. for sleep.   ALLERGIES:  No known drug allergies.   REVIEW OF SYMPTOMS AND PHYSICAL EXAMINATION:  Please see the dictated  history and physical done at the time of admission.   HOSPITAL COURSE:  The patient was admitted electively and on Jan 04, 2009, he was taken to the operating room where he underwent the  following procedure, right miniature thoracotomy from mitral valve  repair (triangular plication of posterior leaflet with 28-mm Sorin MEMO  3D Ring Annuloplasty).  Additionally, he underwent a Cox-CryoMaze  procedure (left side lesion set).  Procedure was performed by Tressie Stalker, MD.  Operative findings were notable for  the following:  1. Mitral valve prolapse with fibroelastic deficiency-type      degenerative disease.  2. Flail posterior leaflet P3 with severe mitral regurgitation.  3. Mild-to-moderate aortic insufficiency.  4. Normal left ventricular systolic function.  5. Moderate-to-severe left atrial enlargement.  6. No residual mitral regurgitation following successful mitral valve      repair.  The patient tolerated the procedure well.  He was taken to the surgical  intensive care unit in stable condition.   POSTOPERATIVE HOSPITAL COURSE:  The patient has progressed nicely.  He  has remained hemodynamically stable.  All routine lines, monitors,  drainage devices have been discontinued in the standard fashion.  He has  been restarted postoperatively on his amiodarone after following his  rhythms early on in which he has some sinus bradycardia.  He is now in  normal sinus rhythm.  He does have a mild-to-moderate acute blood loss  anemia.  His values are stable.  His most recent hemoglobin and  hematocrit dated Jan 06, 2009, are 11 and 32 respectively.  Most recent  electrolytes were within normal limits.  BUN and creatinine dated Jan 07, 2009, are 15 and 1.15.  He has been started on Coumadin.  His most  recent INR dated January 08, 2009, is 1.0.  His incisions were healing well  without evidence of infection.  His postoperative volume overload is  responding well to diuresis.  His activity is increased using standard  protocols and he is tolerating this well.  Oxygen has been weaned and he  maintains good saturations on room air.  His chest x-ray is felt to be  quite stable on appearance.  His overall status is felt to be stable for  discharge on today's date January 08, 2009.   MEDICATIONS ON DISCHARGE:  1. Nisoldipine 20 mg daily.  2. Simvastatin 80 mg daily.  3. Benazepril 40 mg daily.  4. Amiodarone 200 mg twice daily.  5. Coumadin 5 mg daily and as directed.  6. Aspirin 81 mg daily.  7.  Lasix 40 mg daily.  8. Potassium chloride 20 mEq daily.  9. Ultram 50 mg every 6 hours p.r.n. as needed for pain.   INSTRUCTIONS:  The patient will receive written instructions in regard  to medications, activity, diet, wound care, and followup.   FOLLOWUP:  Dr. Cornelius Moras on January 14, 2009, at 11 a.m. and Dr. Amil Amen on January 23, 2009, at 11:30 a.m.  Additionally, he will obtain a PT/INR on January 11, 2009, at Dr. Amil Amen' office at 11:30 a.m.   FINAL DIAGNOSES:  1. Several mitral regurgitation, status post repair as described  above.  2. History of paroxysmal atrial fibrillation, status post left-sided      Cox-CryoMaze procedure.  3. Nonobstructive coronary artery disease.  4. Postoperative acute blood loss anemia.  5. Postoperative volume overload.  6. History of colon cancer, status post resection.  7. History of tympanoplasty in the distant past.  8. Daily alcohol use.      Rowe Clack, P.A.-C.      Salvatore Decent. Cornelius Moras, M.D.  Electronically Signed    WEG/MEDQ  D:  01/08/2009  T:  01/08/2009  Job:  [id number]   cc:   Salvatore Decent. Cornelius Moras, M.D.  Francisca December, M.D.  Pam Drown, M.D.

## 2010-12-23 NOTE — Op Note (Signed)
NAME:  JESSIE, SCHRIEBER NO.:  [id number]   MEDICAL RECORD NO.:  [id number]          PATIENT TYPE:  INP   LOCATION:  2307                         FACILITY:  [facility]   PHYSICIAN:  Salvatore Decent. Cornelius Moras, M.D. DATE OF BIRTH:  05/07/1925   DATE OF PROCEDURE:  01/04/2009  DATE OF DISCHARGE:                               OPERATIVE REPORT   PREOPERATIVE DIAGNOSES:  1. Severe mitral regurgitation.  2. Recurrent paroxysmal atrial fibrillation.   POSTOPERATIVE DIAGNOSES:  1. Severe mitral regurgitation.  2. Recurrent paroxysmal atrial fibrillation.   PROCEDURE:  Right miniature thoracotomy for mitral valve repair  (triangular plication of posterior leaflet with 28 mm Sorin MEMO 3D ring  annuloplasty) and Cox CryoMaze procedure (left side lesion set).   SURGEON:  Salvatore Decent. Cornelius Moras, MD   ASSISTANT:  Stephanie Acre. Dasovich, PA   ANESTHESIA:  General.   BRIEF CLINICAL NOTE:  The patient is an 77 year old male who has known  history of mild aortic insufficiency, but recently developed  intermittent episodes of recurrent paroxysmal atrial fibrillation.  He  was noted to have a new murmur on physical exam by his primary care  physician, and a followup echocardiogram was performed demonstrating  severe mitral regurgitation.  He was referred to Dr. Corliss Marcus and  transesophageal echocardiogram was performed demonstrating normal left  ventricular size and function with mitral valve prolapse and severe (4+)  mitral regurgitation.  There remained mild-to-moderate aortic  insufficiency.  The patient subsequently underwent cardiac  catheterization.  This revealed nonobstructive coronary artery disease  with exception of 50-60% stenosis of the left anterior descending  coronary artery.  Nuclear stress test was subsequently performed and was  notable for the absence of anterior wall ischemia.  A full consultation  note has been dictated previously.  The patient has been counseled  regarding the indications, risks, and potential benefits of surgical  intervention for treatment of severe mitral regurgitation.  The severity  of aortic regurgitation is felt to be relatively mild, and not severe  enough to warrant repair or replacement of the aortic valve.  The  moderate stenosis of the left anterior descending coronary artery is  felt not to be functionally significant, and as such coronary artery  bypass is felt not to be necessary.  Alternative treatment strategies  have been discussed.  He understands and accepts all potential  associated risks of surgery and desires to proceed as described.   OPERATIVE FINDINGS:  1. Mitral valve prolapse with fibroelastic deficiency-type      degenerative disease.  2. Flail posterior leaflet (P3) with severe mitral regurgitation.  3. Mild-to-moderate aortic insufficiency.  4. Normal left ventricular systolic function.  5. Moderate-to-severe left atrial enlargement.  6. No residual mitral regurgitation following successful mitral valve      repair.   OPERATIVE NOTE IN DETAIL:  The patient was brought to the operating room  on the above-mentioned date and central monitoring was established by  the Anesthesia Team under the care and direction of Dr. Adonis Huguenin.  Specifically, a Swan-Ganz catheter was placed through the left internal  jugular approach.  A radial arterial  line was placed.  Intravenous  antibiotics were administered.  The patient was placed in the supine  position on the operating table.  General endotracheal anesthesia was  induced uneventfully.  The patient was initially intubated using a dual-  lumen endotracheal tube.  A Foley catheter was placed.  The patient was  placed in position on the table with a soft roll behind the right  scapula and the neck gently turned towards the left and extended  slightly.  The patient's right neck, chest, abdomen, both groins, and  both lower extremities were prepared and draped  in sterile manner.   Baseline transesophageal echocardiogram was performed by Dr. Krista Blue.  This demonstrates mitral valve prolapse with a flail segment of the  posterior leaf of the mitral valve.  The flail segment was somewhat  eccentrically located and appears to be close to the posterior  commissure.  There was severe (4+) mitral regurgitation.  There was mild-  to-moderate aortic regurgitation.  The aortic valve was tricuspid.  All  three leaflets moved well.  There was no prolapse of any of the leaflets  and there was an eccentric narrow jet of aortic insufficiency.  The  skirts eccentrically along the ventricular surface of the anterior leaf  of the mitral valve.  It does not fill the entire left ventricle.  There  was normal left ventricular systolic function.  There was mild left  ventricular chamber enlargement.  There was moderate-to-severe left  atrial enlargement.  No other abnormalities were noted.   A small incision was made in the right inguinal crease.  Through this  incision, the anterior surface of the right common femoral artery and  right common femoral vein were dissected away from associated  structures.  The right common femoral vein was dissected at the level of  the greater saphenous bulb.  The right common femoral artery was normal  in appearance.   Single lung ventilation was begun.  A right anterolateral miniature  thoracotomy incision was performed just lateral to the right nipple.  The right chest was entered through the fourth intercostal space.  Two  10-mm thoracoscopic ports were placed inferiorly and one of the ports  was insufflated with carbon dioxide to insufflate the right chest  throughout the duration of the remainder of the operation.  A soft  tissue retractor was placed through the incision.  Exposure was felt to  be excellent.  A longitudinal incision was made in the pericardium 2 cm  anterior to the phrenic nerve.  Silk traction sutures were  placed in  either direction to facilitate exposure.   The patient was heparinized systemically.  A pursestring suture was  placed around the base of the right greater saphenous bulb.  Two  concentric pursestring sutures were placed on the anterior surface of  the right common femoral artery.  The femoral vein was cannulated  through the greater saphenous bulb and a long flexible guidewire was  advanced up through the inferior vena cava through the right atrium into  the superior vena cava using transesophageal echocardiogram for  guidance.  The femoral vein was dilated with serial dilators and a 23 x  25-French long femoral venous cannula was then advanced over the  guidewire up through the inferior vena cava through the right atrium  until the tip of the cannula extends into the superior vena cava.  The  right common femoral artery was cannulated using a Seldinger technique  through the pursestring sutures.  Femoral artery  was dilated with serial  dilators and an 18-French femoral arterial cannula was advanced over the  guidewire into the distal femoral artery.  A small stab incision was  made low in the right neck.  The right internal jugular vein was  cannulated with Seldinger technique and a flexible guidewire was  advanced into the right atrium.  The internal jugular vein was dilated  with serial dilators and a 14-French pediatric femoral venous cannula  was advanced over the guidewire and to the superior vena cava.   Cardiopulmonary bypass was begun.  Vacuum-assisted venous drainage was  employed.  Venous drainage was notably excellent and exposure was  excellent.  The pericardiotomy incision was extended in both directions.  The undersurface of the aorta was dissected away from the anterior  surface of the right pulmonary artery.  Two concentric pursestring  sutures were placed on the anterolateral surface of the ascending aorta.  A pursestring suture was placed in the right  atrium.  Retrograde  cardioplegic cannula was placed through the pursestring suture in the  right atrium and directed into the coronary sinus using transesophageal  echocardiogram for guidance.  An antegrade cardioplegic cannula was then  placed directly in the ascending aorta.   The patient was placed in Trendelenburg position.  The aortic crossclamp  was applied and cold blood cardioplegia was delivered initially in  antegrade fashion through the aortic root.  After the initial diastolic  arrest, cardioplegia was switched to retrograde cardioplegia due to the  presence of aortic insufficiency.  The initial cardioplegic arrest was  excellent with rapid diastolic arrest.  Repeat doses of cardioplegia  were administered intermittently throughout the crossclamp portion of  the operation either antegrade through the aortic root or retrograde  through the coronary sinus catheter to maintain completely flat  electrocardiogram.  Cardioplegia was administered every 20 minutes of  not more frequently.   The left atriotomy incision was performed posteriorly through the  interatrial groove.  The floor of the left atrium and the mitral valve  were exposed using the retractor blade exited through a side arm placed  through separate stab incision just to the right side of the sternum in  the fourth intercostal space.  Exposure was felt to be excellent.  The  mitral valve was carefully examined.  There was fibroelastic deficiency-  type degenerative disease of the mitral valve.  There was a flail  segment of the posterior leaflet to correspond to the P3 scallop  adjacent to the posterior medial commissure.  The primary chords to this  segment were all ruptured.  There were no other areas of prolapse of any  significance.   The left-sided Cox maze procedure was performed using the ATS cryothermy  probe.  Two minutes freezes were utilized for each lesion.  An  elliptical lesion was created around the  pulmonary veins using the  atriotomy incision in the anterior portion of this ellipse.  Initially,  a lesion was placed across the dome of the left atrium to reach the  anterior cephalad surface surrounding the left-sided pulmonary veins.  Mirror image lesion was then placed across the back wall of the left  atrium to reach the inferior rim of the left-sided pulmonary veins.  A  lesion was created across the back wall of the left atrium to reach the  posterior rim of the mitral annulus and a mirror image lesion was  created along the epicardial surface along the back wall over the  coronary sinus.  This completes the left side lesion set of Cox maze  procedure.   Interrupted 2-0 Ethibond horizontal mattress sutures were placed  circumferentially around the entire mitral annulus.  These sutures will  ultimately be utilized for ring annuloplasty, and at this juncture, they  facilitate symmetrical suspension of the valve for subsequent repair.  The flail segment of the posterior leaflet was now addressed.  Due to  the presence of fibroelastic deficiency-type disease with no significant  excess leaflet tissue, leaflet resection was avoided.  The prolapsed  segment of P3 was corrected using a triangular plication performed using  interrupted CV5 Gore-Tex everting simple sutures.  Single CV4 Gore-Tex  pledgeted stitch was placed in the tip of the posterior medial papillary  muscle and a second CV4 Gore-Tex suture was placed through the pledgets  to be subsequently utilized as an artificial chord.  The valve was not  tested and appears to be competent even without attachment of the  artificial chords to the posterior leaflet.  The valve was sized to  accept a 28-mm annuloplasty ring.  Sorin MEMO 3D annuloplasty ring  (catalog number [id number], serial number [id number]) was secured in place  uneventfully.  After completion of the valve repair, the valve was again  tested with saline and appears to be  perfectly competent.  There was no  residual prolapse.  There was a broad symmetrical line of coaptation of  the anterior posterior leaflets.  There was no leak.  The artificial  Gore-Tex chord was removed and never attached to the posterior leaflet.   Rewarming was begun.  The left atriotomy was closed using a two-layer  closure of running 3-0 Prolene suture.  A sump drain was placed across  the mitral valve into the left ventricle to serve as a left ventricular  vent.  One final dose of warm retrograde hot shot cardioplegia was  administered.  The aortic crossclamp was removed after total crossclamp  time of 166 minutes.  The heart began to beat spontaneously.  Epicardial  pacing wire was fixed to the undersurface of the right ventricular free  wall.  The patient has developed ventricular fibrillation and was  promptly cardioverted.  Spontaneous rhythm then ensues.  Atrial pacing  wires were placed to the surface of the right atrial appendage after  removal of the retrograde cardioplegic cannula.  The patient was  rewarmed to 37-degree centigrade temperature.  Once pacing was begun,  the left ventricular vent was removed.  The heart was allowed to fill,  the lungs were ventilated and flows were temporarily decrease to allow  visualization of the valve using transesophageal echocardiogram.  The  valve appears to be functioning perfectly, normally without any residual  mitral regurgitation.  Full flow was resumed and the antegrade  cardioplegic cannula was removed from the ascending aorta.  Meticulous  hemostasis was ascertained.   The patient was weaned from cardiopulmonary bypass without difficulty.  The patient's rhythm at separation from bypass was AV paced.  Total  cardiopulmonary bypass time for the operation was 238 minutes, no  inotropic support was required.   Followup transesophageal echocardiogram was performed by Dr. Krista Blue  after separation from bypass demonstrates  normally functioning mitral  valve with no residual mitral regurgitation.  There was a well-seated  annuloplasty ring in the mitral position.  The valve was functioning  perfectly without any residual prolapse or leak.  There remains mild-to-  moderate aortic insufficiency, unchanged from preoperatively.  Left  ventricular function was normal.  There was no sign of any significant  air.   Protamine was administered to reverse the anticoagulation.  The femoral  venous cannula was removed and the greater saphenous bulb was ligated.  The femoral arterial cannula was removed and the pursestring sutures  were secured.  There was a palpable pulse in the distal femoral artery.  The right internal jugular cannula was removed and the cannulation site  controlled with the everting suture buttressed with a red rubber  catheter.   Single lung ventilation was now begun.  The right atrium and left  atriotomy incision were inspected for hemostasis.  A 28-French Bard  drain was exited through the anterior port incision and placed to drain  the pericardial sac.  The pericardium was now patched closed using a  patch of CorMatrix bovine submucosa framework.  The right chest was now  irrigated with saline solution and inspected for hemostasis.  The On-Q  continuous pain management system was utilized to facilitate  postoperative pain control.  One 5-inch catheter was supplied with the  On-Q kit, tunneled initially through the subcutaneous tissues to the  posterior port incision and then placed through the port incision and  tunneled into the subpleural space posteriorly to cover the second  through the sixth intercostal nerve bundles posteriorly.  The catheter  was flushed with 5% bupivacaine solution and ultimately connected to  continuous infusion pump.  The right chest drained with a 28-French Bard  drain placed through the posterior port incision.  The thoracotomy  incision was closed in multiple  layers in routine fashion.  The right  groin incision was closed in multiple layers in routine fashion and all  skin incisions were closed with subcuticular skin closures.   The patient tolerated the procedure well.  The patient was reintubated  using a single-lumen endotracheal tube and subsequently transported to  the surgical intensive care unit in stable condition.  There were no  intraoperative complications.  All sponge, instrument, and needle counts  were verified and correct at completion of the operation.      Salvatore Decent. Cornelius Moras, M.D.  Electronically Signed     CHO/MEDQ  D:  01/04/2009  T:  01/04/2009  Job:  [id number]   cc:   Francisca December, M.D.  Pam Drown, M.D.

## 2010-12-23 NOTE — Assessment & Plan Note (Signed)
OFFICE VISIT   Timothy Suarez, Timothy Suarez  DOB:  03-29-1925                                        Dec 16, 2009  CHART #:  [id number]   HISTORY:  The patient returns for routine followup, now 1 year status  post right miniature thoracotomy for mitral valve repair and Cox  CryoMaze procedure.  He was last seen here in the office on June 17, 2009.  Since then, he has continued to do well.  He states that he was  recently started on high-dose vitamin D therapy because he had been  complaining of some depression by Dr. Corliss Blacker.  He has done very well  from a cardiac standpoint, and he denies any shortness of breath either  with activity or at rest.  He has not had any tachy palpitations.  The  remainder of his review of systems is notable for the fact that he did  get exhausted recently when he was at graduation ceremony at [employer].  He purposefully did not drink any fluids that day  because he did not want to have to go to the bathroom.  He got  overheated and dehydrated, and it took him several hours to recover.  Other than that, he has been doing very well.  The remainder of his  review of systems is unremarkable.  The remainder of his past medical  history is unchanged.   CURRENT MEDICATIONS:  Aspirin, Sular, simvastatin, and Ambien as needed.   PHYSICAL EXAMINATION:  Notable for a well-appearing male with blood  pressure of 133/71 and pulse 86, regular.  Two-channel telemetry rhythm  strip demonstrates normal sinus rhythm.  Auscultation of the chest  reveals clear breath sounds that are symmetric bilaterally.  No wheezes,  rales, or rhonchi are noted.  The patient does appear to have a small  hernia at the site of his previous right miniature thoracotomy incision  laterally.  However, there is nothing to suggest any sort of  incarceration or entrapment of any lung tissue but one can palpate a  defect at that level.  It is possible that  the fascial closure  separated.  Cardiovascular exam demonstrates regular rate and rhythm.  No murmurs, rubs, or gallops are noted.  The abdomen is soft and  nontender.  The extremities are warm and well perfused.  There is no  lower extremity edema.   IMPRESSION:  The patient appears to be doing well from a cardiac  standpoint.  He is maintaining a sinus rhythm, and he looks good.  He  does have, on physical exam, suggestion of a hernia in his chest wall at  the site of his previous mini thoracotomy incision.  This does not seem  to be bothering him, and he specifically denies any history of pain or  any history to suggest intermittent incarceration of the underlying  lung.   PLAN:  I have advised the patient to call or return at any point in the  future should he develop any pain at the site of his previous  thoracotomy incision.  We will otherwise plan to see him back in 6  months for routine followup and rhythm check.   Salvatore Decent. Cornelius Moras, M.D.  Electronically Signed   CHO/MEDQ  D:  12/16/2009  T:  12/17/2009  Job:  [id number]  cc:   Francisca December, M.D.  Armanda Magic, M.D.  Pam Drown, M.D.

## 2010-12-23 NOTE — Assessment & Plan Note (Signed)
OFFICE VISIT   LOC, FEINSTEIN  DOB:  09/08/1924                                        April 01, 2009  CHART #:  [id number]   HISTORY OF PRESENT ILLNESS:  The patient returns to the office today for  routine followup and rhythm check, approximately 3 months status post  right miniature thoracotomy for mitral valve repair and Cox cryo-maze  procedure.  He was last seen here in the office on February 01, 2009.  Since  then, the patient has overall done fairly well.  He apparently got  confused about his medications at one point and actually stopped taking  Coumadin for several days.  This was straightened out, and he is back on  Coumadin.  He reports that over the last couple of months he has  continued to do well physically.  He is gradually increasing his  physical activity, and he states that his exercise tolerance is close to  being back to how it was prior to surgery.  He does note that if he  pushes himself physically he tends to get very tired and dizzy.  He  reports that similar symptoms were present prior to surgery, and prior  to surgery he had one brief syncopal episode as well.  He has never had  any syncopal episodes since surgery, and he states that the dizziness is  more of a generalized weakness that tends to occur after he pushes  himself.  He reports that when this occurs he checks his pulse and  states that his pulse remains stable as far as he can ascertain, and he  is not aware of any sort of tachy palpitations.  He has never had any  chest pain.  He has not had any shortness of breath.  He otherwise is  getting along quite well and has no other complaints.   CURRENT MEDICATIONS:  1. Sular 20 mg daily.  2. Coumadin 5 mg or as directed.  3. Simvastatin 80 mg daily.   PHYSICAL EXAMINATION:  Notable for well-appearing male with blood  pressure 116/65, pulse is 90, and two-channel telemetry rhythm strip  demonstrates normal sinus rhythm.   Examination of the chest reveals  clear breath sounds which are symmetrical bilaterally.  No wheezes or  rhonchi are noted.  Cardiovascular exam reveals regular rate and rhythm.  No murmurs, rubs, or gallops are appreciated.  The abdomen is soft,  nontender.  The extremities are warm and well perfused.  There is no  lower extremity edema.   IMPRESSION:  The patient appears to be doing well.  He still has some  tendency to get confused with regards to what medications he is taking  or should be taking.  He appears to be maintaining sinus rhythm, and he  is now off amiodarone.  He does still have some occasional mild dizzy  spells that are not associated with tachy palpitations.  It is hard to  know whether or not these could represent arrhythmias, although it seems  to be more generalized fatigue induced with strenuous activity.  Otherwise, he looks quite well and he reports that overall he continues  to improve.   PLAN:  I think it would be reasonable to consider an outpatient  CardioNet monitor or Holter monitor to see if the patient is in fact  remaining  in sinus rhythm.  If there are no signs of any significant  occult atrial arrhythmias, it would be reasonable to go ahead and stop  his Coumadin.  I am a bit concerned about the risk of prolonged  anticoagulation with coumadin, as Mr. Vanness intermittently has gotten  confused regarding which medications he should be taking.  I have also  suggested that he cut his dose of Sular down to 10 mg daily as his blood  pressure seems to be running a little bit low.  His pulse is 90, and  perhaps he might be better served by treating his hypertension with beta-  blocker.  We will leave this decision to Dr. Amil Amen' discretion.  All  of Mr. Macken questions have been addressed.  We will plan to see  the patient back for further followup and rhythm check in 3 months.   Salvatore Decent. Cornelius Moras, M.D.  Electronically Signed   CHO/MEDQ  D:   04/01/2009  T:  04/02/2009  Job:  [id number]   cc:   Francisca December, M.D.  Pam Drown, M.D.

## 2010-12-23 NOTE — Assessment & Plan Note (Signed)
OFFICE VISIT   KELVEN, FLATER  DOB:  22-Feb-1925                                        January 14, 2009  CHART #:  [id number]   HISTORY OF PRESENT ILLNESS:  The patient returns to the office today for  routine followup status post right miniature thoracotomy for mitral  valve repair and Cox cryo-maze procedure on Jan 04, 2009.  His  postoperative recovery has been uncomplicated.  The patient was  discharged home on the fourth postoperative day in normal sinus rhythm.  Since then, he has done fairly well, although he states that he has been  feeling a little bit weak and at times mildly dizzy.  He has not had any  syncopal episodes.  He has not had any tachy palpitations.  He is eating  fairly well.  He has not had significant shortness of breath.  He states  that he does get mildly tired and short of breath with activity, but he  is getting around better and better every day.  He has mild residual  soreness in his chest.  This has not hampered him much.  Overall, he has  no complaints otherwise.  Medications remain unchanged from the time of  hospital discharge.  His prothrombin time is being monitored through Dr.  Amil Amen' office.   PHYSICAL EXAMINATION:  Notable for a well-appearing male.  Blood  pressure is 101/65 on the right, 84/48 on the left, pulse 78 and  regular.  Two0channel telemetry rhythm strip demonstrates normal sinus  rhythm.  Oxygen saturation is 95% on room air.  Examination of the chest  reveals clear breath sounds which are symmetrical bilaterally.  No  wheezes or rhonchi are noted.  The miniature thoracotomy incision is  healing nicely.  Chest tube sutures are removed.  Cardiovascular exam is  notable for regular rate and rhythm.  No murmurs, rubs, or gallops are  appreciated.  The abdomen is soft and nontender.  The extremities are  warm and well perfused.  The right groin incision is healing nicely.  There is no lower extremity  edema.  By report, he is at or below his  baseline weight.   DIAGNOSTIC TESTS:  Chest x-ray performed today at the [facility] is reviewed.  This demonstrates clear lung fields bilaterally  with resolving mild bibasilar atelectasis and right chest opacity.  No  other abnormalities are noted.   IMPRESSION:  The patient appears to be doing well, although his blood  pressure is running considerably lower than his baseline and he may be  overtreated with antihypertensive medications.  His weight is at or  below baseline, and he probably no longer requires any diuretic therapy  either.  He otherwise seems to be doing quite well.  He is maintaining  sinus rhythm.   PLAN:  I have instructed the patient to stop taking nisoldipine for the  time being but continue taking benazepril.  He has been instructed to  stop taking Lasix and potassium.  We will cut his amiodarone back to 200  mg daily.  We will plan to see him back in 2 weeks' time to make sure  that he continues to improve.  All of his questions have been addressed.   Salvatore Decent. Cornelius Moras, M.D.  Electronically Signed   CHO/MEDQ  D:  01/14/2009  T:  01/15/2009  Job:  [id number]   cc:   Francisca December, M.D.  Pam Drown, M.D.

## 2010-12-23 NOTE — H&P (Signed)
HISTORY AND PHYSICAL EXAMINATION   Dec 24, 2008   Re:  Timothy Suarez, Timothy Suarez            DOB:  Jan 06, 1925   The patient returns to the office today for further followup related to  severe mitral regurgitation.  He was originally seen in consultation on  December 06, 2008, and a full consultation and history and physical exam  were dictated at that time.  Since then the patient has remained  clinically stable.  He does complain of intermittent progressive  exertional shortness of breath.  He has not had any further episodes of  tachypalpitations.  He has not had any chest pain.  He has not had any  resting shortness of breath, PND, orthopnea, or lower extremity edema.  He states that yesterday he was working in the yard and got tired and  short of breath much quicker than he typically does and had to stop.  Other than that his review of systems remained unchanged from  previously.   The patient underwent a stress myocardial perfusion exam on Dec 13, 2008.  This revealed normal perfusion both at rest and post stress in all  myocardial regions.  Of note, the left ventricle did enlarge in size  following stress, consistent with underlying valvular heart disease.  There were no regional wall motion abnormalities, and in particular the  results of this exam suggest that the mild stenosis of the left anterior  descending coronary artery seen on heart catheterization is not  physiologically significant.   The patient underwent CT angiogram of the thoracic and abdominal aorta  and iliac vessels on Dec 12, 2008.  This revealed some mild  calcifications in the coronary arteries, in the aorta, but there were no  significant plaques or areas of atherosclerotic stenosis.   I again reviewed the indications, risks, and potential benefits of  surgical treatment for severe mitral regurgitation and paroxysmal atrial  fibrillation with the patient here in the office today.  We discussed  the relative risks and benefits of use of minimally invasive approach  versus a conventional sternotomy.  We discussed the results of his  recent myocardial stress test, and the fact that this suggests there is  no need for concomitant coronary artery bypass grafting or percutaneous  coronary intervention at this time.  We tentatively plan to proceed with  elective mitral valve repair and maze procedure on Friday, Jan 04, 2009.  The patient understands and accepts all potential associated risks of  surgery including but not limited to risk of death, stroke, myocardial  infarction, congestive heart failure, respiratory failure, pneumonia,  bleeding requiring blood transfusion, arrhythmia, heart block or  bradycardia requiring permanent pacemaker, late progression of coronary  artery disease, late recurrence of valvular heart disease or pathology.  He understands that there is a small chance that a satisfactory valve  repair cannot be achieved under those circumstances.  We would replace  his valve using a bioprosthetic tissue valve.  This would seem very  unlikely based upon his preoperative transesophageal echocardiogram, and  I feel confident that valve repair will be technically feasible.  He  understands the relative risks and benefits of concomitant maze  procedure at the time of surgery in effort to decrease the likelihood of  recurrent atrial arrhythmias in the long-term future.  We have started  the patient on amiodarone preoperatively, to begin at 200 mg p.o. twice  daily, begin between now and the time of surgery.  All  of his questions  have been addressed.   Salvatore Decent. Cornelius Moras, M.D.  Electronically Signed   CHO/MEDQ  D:  12/24/2008  T:  12/25/2008  Job:  [id number]   cc:   Francisca December, M.D.  Pam Drown, M.D.

## 2010-12-23 NOTE — Assessment & Plan Note (Signed)
OFFICE VISIT   Timothy Suarez, Timothy Suarez  DOB:  March 19, 1925                                        June 02, 2010  CHART #:  [id number]   HISTORY OF PRESENT ILLNESS:  The patient returns for further followup  status post right miniature thoracotomy for mitral valve repair and maze  procedure.  He was last seen here in the office on Dec 16, 2009.  Since  then, he has continued to do well physically.  In fact, the patient  states that he probably feels better now than he has in a long time.  He  has not had any problems with shortness of breath.  He states that he  still will get tired and fatigued if he pushes himself too much  physically.  He has not had any chest pain.  He has not had any  shortness of breath.  He has not had any pain in his right thoracotomy  at the site of his surgery.  His appetite is good.  He is sleeping well.  He has no tachy palpitations or dizzy spells.  The remainder of his  review of systems is unremarkable.  The remainder of his past medical  history is unchanged.   CURRENT MEDICATIONS:  Aspirin, nisoldipine, simvastatin, vitamin D, and  Travatan eye drops.   PHYSICAL EXAMINATION:  Notable for well-appearing gentleman with blood  pressure 105/65 and pulse 92 and regular.  Two-channel telemetry rhythm  strip demonstrates normal sinus rhythm.  Oxygen saturation is 96% on  room air.  Examination of the chest reveals clear breath sounds that are  symmetrical bilaterally.  The patient does have a small hernia at the  site of his previous mini thoracotomy incision.  There is an obvious  fascial defect which was noted before.  It is completely nonpainful and  there is no history of anything to suggest any potential for  incarceration of lung tissue.  It does not seem to be any different in  size.  Cardiovascular exam is notable for regular rate and rhythm.  No  murmurs, rubs, or gallops are noted.  The abdomen is soft and nontender.  The  extremities are warm and well perfused.  There is no lower extremity  edema.   IMPRESSION:  The patient is doing well from a cardiovascular standpoint.  He does have a small chest wall hernia at the site of his previous mini  thoracotomy that is completely asymptomatic.   PLAN:  I have discussed options with the patient.  He does not wish to  have surgery to repair his hernia unless it is absolutely necessary.  I  think it is reasonable to keep an eye on this, although if the hernia  starts getting any larger, we probably should proceed with surgical  correction.  He has been instructed to call and return to see [place] if he  has any sort of pain in his chest wall that might be attributable to  this hernia.  Otherwise, we will see him back in 6 months.   Salvatore Decent. Cornelius Moras, M.D.  Electronically Signed   CHO/MEDQ  D:  06/02/2010  T:  06/02/2010  Job:  [id number]   cc:   Francisca December, M.D.  Armanda Magic, M.D.  Pam Drown, M.D.

## 2010-12-26 NOTE — Discharge Summary (Signed)
NAME:  Timothy Suarez, Timothy Suarez                      ACCOUNT NO.:  [id number]   MEDICAL RECORD NO.:  [id number]                   PATIENT TYPE:  INP   LOCATION:  0449                                 FACILITY:  [facility]   PHYSICIAN:  Angelia Mould. Derrell Lolling, M.D.             DATE OF BIRTH:  [birth year]   DATE OF ADMISSION:  04/09/2003  DATE OF DISCHARGE:  04/13/2003                                 DISCHARGE SUMMARY   FINAL DIAGNOSES:  1. Carcinoma of the descending colon, poorly differentiated, stage T2, N0.  2. Hypertension.  3. Umbilical hernia.   OPERATION PERFORMED:  Left colectomy.   HISTORY:  This is a 77 year old white male who has been asymptomatic but was  found to have Hemoccult-positive stools.  A colonoscopy performed by Dr.  Randa Evens showed a malignant mass at 45 cm.  This was described as being about  6 cm in size.  Biopsy showed adenocarcinoma.  The rest of the colonoscopy  was normal.  He was advised to undergo surgery for this and consented to  that.   PHYSICAL EXAM:  GENERAL:  Pleasant, healthy-appearing gentleman.  In no  distress.  VITAL SIGNS:  Height 6 feet 0 inches, weight 212 pounds.  NECK:  Showed no adenopathy or mass.  LUNGS:  Clear to auscultation.  HEART:  Regular rate and rhythm.  No murmurs or rubs.  ABDOMEN:  Soft and nontender.  Liver and spleen not enlarged.  GENITOURINARY:  Normal penis, scrotum, and testes.  No inguinal adenopathy  or hernia.  LYMPHATIC:  Showed no enlarged lymph nodes in the neck or groins.   HOSPITAL COURSE:  The patient underwent bowel prep at home and then was  admitted electively for surgery.  He was taken to the operating room on  April 09, 2003.  I found that he had a small palpable mass at the proximal  sigmoid colon, but there were no enlarged mesenteric or retroperitoneal  lymph nodes.  The liver and spleen felt normal.  The omentum felt normal,  and the small bowel looked normal.  He underwent a left colectomy with  primary  anastomosis.   Final pathology report showed that this was a poorly differentiated  adenocarcinoma with superficial invasion of the muscularis propria making  this a stage T2, N0.  There were multiple foci of vascular invasion.   The patient was seen in consultation by Dr. Cephas Darby.  He noted the  normal preoperative CEA.  He stated that the patient did not need any  adjuvant chemotherapy and felt that the patient could be followed by his  primary care physician.   Postoperatively the patient progressed uneventfully.  He was on the Adolor  drug study research protocol.  He was allowed a clear-liquid diet on  postoperative day #1.  He had some nausea on postoperative day #1, but that  resolved.  He was offered a solid diet on postoperative day #2.  He  did not  pass any flatus for the first couple of days, but on the third postoperative  day he began passing flatus and had several bowel movements and began  tolerating a regular  diet quite well.  He was discharged on April 13, 2003, postoperative day  #4, doing well, feeling well.  At that time he was afebrile, looked good.  His abdomen was soft and flat.  Wound was clean.  He was given a  prescription for Darvocet-N 100 for pain and asked to return to see me in  the office in four to six days.                                               Angelia Mould. Derrell Lolling, M.D.    HMI/MEDQ  D:  05/16/2003  T:  05/16/2003  Job:  [id number]   cc:   Pam Drown, M.D.  [address]  [place]  [place] [address]  Fax: [phone]   Llana Aliment. Malon Kindle., M.D.  [address], [address]  [place]  [place] [address]  Fax: [phone]

## 2010-12-26 NOTE — Op Note (Signed)
[place]. [facility]  Patient:    Timothy Suarez, Timothy Suarez                     MRN: [id number] Proc. Date: 11/29/00 Adm. Date:  [id number] Attending:  Colbert Ewing                           Operative Report  PREOPERATIVE DIAGNOSIS:  End-stage degenerative joint disease with varus alignment, right knee.  POSTOPERATIVE DIAGNOSIS:  End-stage degenerative joint disease with varus alignment, right knee.  OPERATION PERFORMED:  Right total knee replacement using Osteonics prosthesis: a) appropriate soft tissue balancing and lateral retinacular release; b) Press-Fit #9 posterior stabilizing femoral component; c) cemented #11 tibial component with 12 mm insert, and; d) cemented recessed nonmetal back 28 mm patellar component.  SURGEON:  Loreta Ave, M.D.  ASSISTANT:  Arlys John D. Petrarca, P.A.-C.  ANESTHESIA:  General.  BLOOD LOSS:  Minimal.  TOURNIQUET TIME:  One hour, ten minutes.  SPECIMEN:  Excised bone and soft tissue, and cultures done.  COMPLICATIONS:  None.  DRAINS:  Hemovac times two.  DESCRIPTION OF PROCEDURE:  Patient was brought to the operating room and placed on the operating table in the supine position.   After adequate anesthesia had been obtained the right knee was examined.  Just about full extension and flexion to a hundred degrees.  Stable ligaments, alignment in varus.  Tethering, patellofemoral joint.  After being prepped and draped, exsanguinated with elevation and Esmarch. Tourniquet was inflated to 350 mmHg.  Straight incision above the patella, down to the tibial tubercle.  Skin and subcutaneous tissue divided.  Median parapatellar arthrotomy.  Hemostasis obtained with electrocautery.  Knee exposed.  Grade IV changes throughout.  Remnants of menisci.  Cruciate ligaments and periarticular spurs removed.  The intramedullary guide placed in the femur.  Distal cut, removing 10 mm, set at five degrees of valgus.  Sized for a  #9 component.  Jig was put in place.  Definitive cuts made for the posterior stabilizing #9 femoral component.  Trial put in place, found to fit well.  Trial removed.  Tibia exposed with the appropriate retractors.  Intramedullary guide placed. Proximal cut, five-degree posterior slope, removing 6 mm off the deficit medial side.  Sized to a #11 component.  With the 12 mm insert and the 11 component on the tibia, and the 9 on the femur I had full extension and full flexion, and nicely balanced knee with no component left off in flexion. Tibia was marked for rotation and then hand reamed.  Patella was then sized, reamed and drilled for the 28 mm component.  All trials were put in place.  Lateral release was necessary to balance the patellofemoral joint; and, this was performed with cautery from inside out. All trials were removed.  Copious irrigation with the pulse irrigating devise.  All recesses examined.  All debris removed including posterior recess on both sides.  Cement prepared; placed on tibial component, which was then hammered into place.  Polyethylene attached.  Femoral component seated.  Knee reduced. Patellar component was placed, compressed and excess cement removed.  We held there with a clamp until the cement hardened.  Once the cement was hard the knee was reexamined.  Excellent motion, good stability and good alignment.  Wound irrigated.  Hemovacs placed and brought out through separate stab wounds.  Arthrotomy closed with #1 Vicryl.  Skin and subcutaneous tissue with Vicryl  and staples.  Margins of the wound and knee injected with Marcaine, and the Hemovacs were clamped.  Sterile compressive dressing applied.  Tourniquet deflated and removed.   Knee immobilizer applied.  Anesthesia reversed.  Brought to recovery room.  Tolerated surgery well with no complications. DD:  11/29/00 TD:  11/29/00 Job: [id number]

## 2010-12-26 NOTE — Op Note (Signed)
   NAME:  Timothy Suarez, Timothy Suarez                        ACCOUNT NO.:  [id number]   MEDICAL RECORD NO.:  [id number]                   PATIENT TYPE:  AMB   LOCATION:  ENDO                                 FACILITY:  [facility]   PHYSICIAN:  James L. Malon Kindle., M.D.          DATE OF BIRTH:  [birth year]   DATE OF PROCEDURE:  03/26/2003  DATE OF DISCHARGE:                                 OPERATIVE REPORT   PROCEDURE:  Colonoscopy and biopsy.   MEDICATIONS:  1. Fentanyl 50 mcg.  2. Versed 5 mg IV.   SCOPE:  Olympus pediatric colonoscope.   INDICATION:  Heme-positive stool.   DESCRIPTION OF PROCEDURE:  The procedure had been explained to the patient  and consent obtained.  The patient in the left lateral decubitus position,  the Olympus scope was inserted and advanced under direct visualization.  The  prep was excellent.  We were able to the cecum, ileocecal valve identified.  The appendiceal orifice was seen.  The scope was withdrawn, and the cecum,  ascending colon, transverse colon, and descending colon were seen well and  were normal.  The scope was withdrawn and in the sigmoid, moderate  diverticulosis.  At 45 cm from the anal verge was a partially  circumferential mass.  This was 6-7 cm in size, possibly larger.  It was  hard and friable and ulcerated.  Multiple biopsies were taken.  The scope  was withdrawn.  The remainder of the sigmoid colon and rectum were free of  polyps or other lesions.  The patient tolerated the procedure well.   ASSESSMENT:  1. Sigmoid colon mass, probable cancer.  2. Diverticulosis.   PLAN:  We will check path, and we will discuss further options with the  patient.  The patient requested before this procedure that the results not  be discussed with his family.                                               James L. Malon Kindle., M.D.    Waldron Session  D:  03/26/2003  T:  03/26/2003  Job:  [id number]   cc:   Pam Drown, M.D.  [address]  [place]  [place] [address]  Fax: [phone]

## 2010-12-26 NOTE — Consult Note (Signed)
NAME:  Timothy Suarez, Timothy Suarez                      ACCOUNT NO.:  [id number]   MEDICAL RECORD NO.:  [id number]                   PATIENT TYPE:  INP   LOCATION:  0449                                 FACILITY:  [facility]   PHYSICIAN:  Genene Churn. Cyndie Chime, M.D.          DATE OF BIRTH:  Jul 04, 1925   DATE OF CONSULTATION:  04/12/2003  DATE OF DISCHARGE:                                   CONSULTATION   This is a medical oncology consultation requested to evaluate this man with  newly diagnosed cancer of the sigmoid colon.   Timothy Suarez is a pleasant 77 year old man, who has been in overall  excellent health.  He was found to have a guaiac positive stool at time of a  routine physical examination by his primary care physician.  He had no  history of change in bowel habits, no melena or hematochezia, no abdominal  pain or cramping.  Laboratory profile showed no anemia.  He was referred for  a colonoscopy performed on March 26, 2003, which showed a neoplasm at 45  cm.  He was admitted here on August 30, and underwent a left hemicolectomy  by Angelia Mould. Derrell Lolling, M.D.  Intraoperatively, he was found to have a small  tumor in the sigmoid colon.  Inspection and palpation of the liver were  normal.  A preop CEA was normal at 1.9, done on August 25.  Preop chest  radiograph was normal.   Pathology shows a 2.5 cm poorly-differentiated adenocarcinoma with vascular  and lymphatic invasion but only superficial invasion of the muscularis  propria and four lymph nodes negative for tumor.  Additional laboratory  studies show normal liver chemistries.   PAST MEDICAL HISTORY:  Unremarkable.  The patient is on no chronic  medications.  He uses 5 mg of Ambien p.r.n. for sleep.   FAMILY HISTORY:  He lost his father at age 62 with pancreatic cancer and had  a paternal uncle who also died at age 76 of pancreatic cancer.  His wife is  a colon cancer survivor and had surgery in 1989.   SOCIAL HISTORY:  He is  married, two daughters, one aged 64, one aged 64.  He  has one brother and two sisters who are healthy.   PHYSICAL EXAMINATION:  GENERAL:  Healthy-appearing man.  HEAD AND NECK:  Normal.  LUNGS:  Clear.  CARDIAC:  Regular cardiac rhythm.  No murmur.  No lymphadenopathy.  ABDOMEN:  Midline surgical incision healing nicely.  No palpable  hepatomegaly.  EXTREMITIES:  No edema.   IMPRESSION:  Stage I, T2, N0, M0, poorly-differentiated adenocarcinoma of  the sigmoid colon.   Although he has some negative characteristics with respect to vascular and  lymphatic invasion within the tumor and poorly-differentiated histology  given stage I status, he should do quite well long-term with a low  recurrence rate with surgery alone.  Adjuvant chemotherapy is not indicated.  He can be followed by his  primary care physicians.  There are no firm  guidelines.  One might check physical exam and lab every six months for the  next five years.  No indication for repetitive CT scans.  Of note, in a [facility] series, about one-third of patients whose CEA were normal at time of  diagnosis did have a rise in CEA at time of relapse so although this would  not be as good a marker to follow as it is in someone who had a CEA  elevation at diagnosis, it would still be useful to follow in the future.   Thank you for this consultation.                                               Genene Churn. Cyndie Chime, M.D.    Lottie Rater  D:  04/12/2003  T:  04/12/2003  Job:  [id number]   cc:   Angelia Mould. Derrell Lolling, M.D.  [address]  [place]  [place] [address]  Fax: [phone]   Pam Drown, M.D.  [address]  [place]  [place] [address]  Fax: [phone]   Llana Aliment. Malon Kindle., M.D.  [address]  [place]  [place] [address]  Fax: [phone]

## 2010-12-26 NOTE — Discharge Summary (Signed)
[place]. [facility]  Patient:    Timothy Suarez, Timothy Suarez                     MRN: [id number] Adm. Date:  [id number] Disc. Date: [id number] Attending:  Colbert Ewing Dictator:   Oris Drone. Petrarca, P.A.-C.                           Discharge Summary  ADMISSION DIAGNOSIS:  Advanced degenerative joint disease of the right knee.  DISCHARGE DIAGNOSIS:  Advanced degenerative joint disease of the right knee.  PROCEDURE:  Right total knee replacement.  HISTORY:  This is a 77 year old white male with a longstanding history of right knee problems with arthroscopic evaluation in 1988 revealing moderate degenerative joint disease.  His symptoms have now worsened and is having pain, swallowing difficulty with activities of daily living.  Now indicated for a right total knee replacement.  HOSPITAL COURSE:  This is a 77 year old white male admitted on November 29, 2000, after appropriate laboratory studies were obtained as well as 1 g Ancef IV ______ .  He was taken to the operating room where he underwent a right total knee replacement.  He tolerated the procedure well.  He was placed on PCA pump postoperatively.  This was a morphine medication.  He was also placed with heparin 5000 units subcutaneous q.12h. until his Coumadin became therapeutic. Consultations with PT, OT, rehab, and social service was obtained. Ambulation, physical therapy, weightbearing as tolerated on the right.  A CPM was placed 0 to 80 degrees for 8 hours per day, incrementing by 10 degrees. He was allowed out of bed to chair the following day.  He was weaned to oral pain medicines on April 23.  The remainder of his hospital course was uneventful.  He ambulated comfortably and was discharged on the 25th, to return back to the office in 10 days for followup.  EKG was noted to be marked sinus bradycardia with nonspecific ST abnormality.  Chest x-ray showed no active disease.  Right knee reveals good  position of the right total knee replacement.  LABORATORY STUDIES:  Studies of preop April 17 showed a white count of 6600, hemoglobin of 13.9, hematocrit of 40.8%, platelet count of 197,000.  Discharge white count 8600, hemoglobin 10.8, hematocrit 31.1%, platelets 177,000.  Preop chemistries, sodium 138, potassium 4.1, chloride 109, CO2 24, glucose 95, BUN 21, creatinine 1.1, calcium 9.1, total protein 6.6, albumin 3.5, AST 19, ALT 16, ALP 65 and total bilirubin 1.0.  Discharge sodium 137, potassium 3.8, chloride 102, CO2 27, glucose 114, BUN 12, creatinine 0.9, calcium 8.6. Urinalysis was benign for voided urine.  Blood type was O positive, antibody screen negative.  DISCHARGE INSTRUCTIONS:  Given a prescription for Coumadin 5 mg tablets, taking 1-1/2 tablets alternating with 1 tablet every other day.  Percocet 1-2 tablets q.4h. p.r.n. pain.  Weightbearing as tolerated on the right knee with a walker and crutches.  No restrictions in his diet.  Keep the incision clean and dry with a dressing upon it.  Protimes to be drawn by home health.  Call if he has any problems with fevers, increased redness or pain in the knee. Call for an appointment to be seen back in 10 days.  Discharged in improved condition. DD:  12/27/00 TD:  12/28/00 Job: [id number]

## 2010-12-26 NOTE — Op Note (Signed)
NAME:  Timothy Suarez, STERN                      ACCOUNT NO.:  [id number]   MEDICAL RECORD NO.:  [id number]                   PATIENT TYPE:  INP   LOCATION:  0009                                 FACILITY:  [facility]   PHYSICIAN:  Angelia Mould. Derrell Lolling, M.D.             DATE OF BIRTH:  August 19, 1924   DATE OF PROCEDURE:  04/09/2003  DATE OF DISCHARGE:                                 OPERATIVE REPORT   PREOPERATIVE DIAGNOSIS:  Carcinoma of the sigmoid colon.   POSTOPERATIVE DIAGNOSIS:  Carcinoma of the proximal sigmoid colon.   OPERATION PERFORMED:  Left colectomy.   SURGEON:  Angelia Mould. Derrell Lolling, M.D.   FIRST ASSISTANT:  Gita Kudo, M.D.   OPERATIVE INDICATION:  This is a 77 year old white man who is healthy and  basically asymptomatic.  He was found to have Hemoccult-positive stools.  A  colonoscopy performed on March 26, 2003, revealed a malignant tumor mass at  45 cm, and the biopsy showed adenocarcinoma.  He has been counseled  regarding indications and details of surgery and is brought to the operating  room electively after having a bowel prep at home.   OPERATIVE FINDINGS:  The patient had a small, palpable mass in the proximal  sigmoid colon.  There were no enlarged mesenteric or retroperitoneal lymph  nodes.  The left ureter was easily seen and preserved.  The liver and spleen  felt normal.  There were no masses.  The omentum felt normal.  The small  bowel looked normal.   OPERATIVE TECHNIQUE:  Following the induction of general endotracheal  anesthesia, a Foley catheter was inserted and the patient's abdomen was  prepped and draped in a sterile fashion.  A lower midline incision was made.  The abdomen was entered and explored with findings as described above.  It  took a few minutes to actually find the tumor because it was relatively  small.  It felt about the size of a quarter or slightly larger, and there  was no obvious invasion of the serosa and no obviously enlarged  lymph nodes.   I mobilized the entire left colon and sigmoid colon by dividing the lateral  peritoneal attachments.  I took the mobilization all the way up to the  splenic flexure and all the way down to the distal sigmoid colon.  This  brought the entire left colon and the sigmoid colon up into the midline.  We  carefully identified the tumor and marked it with a silk stitch.  We cleaned  off the colon about 8 cm proximal to the tumor and about 15 cm distal to the  tumor, and in these areas we divided the colon between Allen clamps.  Great  care was taken to identify and preserve the left ureter.  We scored the  mesentery all the way back to the retroperitoneum and then isolated the  mesenteric vessels between Kelly clamps, divided them, and tied them off  with 2-0 silk ties.  Once all of this was done the specimen was removed and  sent to the lab.  Dr. Tammi Sou examined the specimen and said that the  tumor was between 2 and 3 cm in size.  He also said there was a small  pedunculated polyp near the distal resection margin.  He felt that this was  consistent with the colonoscopically-identified adenocarcinoma.   The anastomosis was created between the proximal and distal ends of the  colon with a single layer of interrupted sutures of 3-0 silk.  They came  together quite nicely, and there was no tension whatsoever.  Corner sutures  of 3-0 silk were placed.  The posterior wall of the anastomosis was created  with interrupted simple sutures of 3-0 silk.  We very carefully turned the  corners in with interrupted inverting sutures of 3-0 silk and then completed  the anterior wall with several inverting sutures of 3-0 silk.  We removed  all the clamps and inspected the anastomosis.  The bowel was pink and  healthy, and there was no problem with any bleeding and no defect  whatsoever.   We changed our gloves and instruments at this point.  We copiously irrigated  the abdomen and pelvis.   There was no bleeding whatsoever.  We closed the  peritoneum laterally with numerous interrupted sutures of 3-0 silk to close  the mesentery behind the colon.  We inspected one more time for bleeding,  everything looked fine.  The small bowel and omentum were returned to their  anatomic positions.  Midline fascia was closed with a running suture of #1  PDS and the skin closed with skin staples.  Clean bandages were placed and  the patient was taken to the recovery room in stable condition.  Estimated  blood loss was about 200 mL.  Complications:  None.  Sponge, needle, and  instrument counts were correct.                                               Angelia Mould. Derrell Lolling, M.D.    HMI/MEDQ  D:  04/09/2003  T:  04/09/2003  Job:  [id number]   cc:   Pam Drown, M.D.  [address]  [place]  [place] [address]  Fax: [phone]   Llana Aliment. Malon Kindle., M.D.  [address]  [place]  [place] [address]  Fax: [phone]

## 2010-12-29 ENCOUNTER — Encounter: Payer: Medicare Other | Admitting: Thoracic Surgery (Cardiothoracic Vascular Surgery)

## 2011-01-12 ENCOUNTER — Encounter (INDEPENDENT_AMBULATORY_CARE_PROVIDER_SITE_OTHER): Payer: Medicare Other | Admitting: Thoracic Surgery (Cardiothoracic Vascular Surgery)

## 2011-01-12 DIAGNOSIS — J984 Other disorders of lung: Secondary | ICD-10-CM

## 2011-01-13 NOTE — H&P (Signed)
HISTORY AND PHYSICAL EXAMINATION  January 12, 2011  Re:  Timothy Suarez, Timothy Suarez            DOB:  [birth year]  DATE OF PLANNED HOSPITALIZATION AND SURGERY:  January 28, 2011.  PRESENTING CHIEF COMPLAINT:  Bulging right chest.  HISTORY OF PRESENT ILLNESS:  The patient is an 77 year old gentleman who underwent right miniature thoracotomy for mitral valve repair and Maze procedure on Jan 04, 2009.  He recovered from this procedure uneventfully and has been maintaining sinus rhythm since then. Clinically, he has done extremely well until last fall when he developed swelling in his right chest wall.  On physical exam, he had findings consistent with chest wall hernia at the site of his minithoracotomy. Elective surgical repair has been offered, but the patient remained asymptomatic and desired to hold off on any sort of surgical intervention.  I had the opportunity to last seen him in follow up in April of this year, and again at that time, he did not wish to proceed with any surgery despite the fact that the size of his hernia seemed to be getting larger.  He now returns to the office stating that it has gotten even bigger and he has become more concerned about it.  He had an upper respiratory tract infection several weeks ago and associated with coughing.  Following this the size of the hernia seemed to become more pronounced.  His cough has since resolved and overall he feels quite well.  He now desires to proceed with elective surgical repair.  REVIEW OF SYSTEMS:  GENERAL:  The patient reports normal appetite. RESPIRATORY:  The patient reports no cough at all at this time.  He has no shortness of breath either with activity or at rest. CARDIOVASCULAR:  The patient denies any shortness of breath either with activity or at rest.  The patient has no chest pain either with activity or rest.  He has no history of tachy palpitations or dizzy spells. GASTROINTESTINAL:  Negative.   The patient has no difficulty swallowing. Bowel function is regular. MUSCULOSKELETAL:  Negative. NEUROLOGIC:  Negative. PSYCHIATRIC:  Notable for some history of depression which reportedly is much improved on his current medical therapy.  PAST MEDICAL HISTORY: 1. Mitral valve prolapse status post repair 2010. 2. Recurrent paroxysmal atrial fibrillation, status post Maze     procedure. 3. Hypertension. 4. Depression.  PAST SURGICAL HISTORY: 1. Right minithoracotomy for mitral valve repair and Maze procedure     May 2010. 2. Partial colectomy 2004. 3. Tympanoplasty in the distant past.  FAMILY HISTORY:  Noncontributory.  SOCIAL HISTORY:  The patient is a widower who lives alone here in [place].  He has 2 grown children and his daughter lives nearby and is very supportive.  He is a nonsmoker.  He denies excessive alcohol use.  He remains active and functionally completely independent.  CURRENT MEDICATIONS:  Simvastatin 80 mg daily, vitamin D 2000 units daily, zolpidem as needed for sleep, Norvasc 10 mg daily, Travatan ophthalmic drops 1 drop left eye daily.  DRUG ALLERGIES:  None known.  PHYSICAL EXAMINATION:  General:  The patient is a well-appearing male who appears younger than stated age in no acute distress.  Vital Signs: Blood pressure 137/93, pulse 80, oxygen saturation 96% on room air. HEENT:  Unrevealing.  Chest:  Examination of the chest reveals a well- healed scar on the right anterolateral chest.  There is a defect in the chest wall between the ribs and with forced expiration  and cough.  There is bulging consistent with chest wall hernia (lung hernia).  It is a very broad-based defect.  There is no tenderness on palpation. Cardiovascular:  Notable for regular rate and rhythm.  No murmurs, rubs, or gallops noted.  Abdomen:  Soft and nontender.  Extremities:  Warm and well-perfused.  There is no lower extremity edema.  There is no sign of venous insufficiency.   Skin:  Clean, dry, healthy appearing throughout.  IMPRESSION:  Enlarging chest wall hernia at the site of previous right thoracotomy.  PLAN:  We plan elective surgical repair on Wednesday, June 20.  The patient understands and accepts all associated risks of surgery and desires to proceed as described.  All of his questions have been addressed.  Salvatore Decent. Cornelius Moras, M.D. Electronically Signed  CHO/MEDQ  D:  01/12/2011  T:  01/13/2011  Job:  [id number]

## 2011-01-26 ENCOUNTER — Ambulatory Visit (HOSPITAL_COMMUNITY)
Admission: RE | Admit: 2011-01-26 | Discharge: 2011-01-26 | Disposition: A | Discharge status: Home / Self Care | Payer: Medicare Other | Source: Ambulatory Visit | Attending: Thoracic Surgery (Cardiothoracic Vascular Surgery) | Admitting: Thoracic Surgery (Cardiothoracic Vascular Surgery)

## 2011-01-26 ENCOUNTER — Other Ambulatory Visit: Payer: Self-pay | Admitting: Thoracic Surgery (Cardiothoracic Vascular Surgery)

## 2011-01-26 ENCOUNTER — Encounter (HOSPITAL_COMMUNITY)
Admission: RE | Admit: 2011-01-26 | Discharge: 2011-01-26 | Disposition: A | Discharge status: Home / Self Care | Payer: Medicare Other | Source: Ambulatory Visit | Attending: Thoracic Surgery (Cardiothoracic Vascular Surgery) | Admitting: Thoracic Surgery (Cardiothoracic Vascular Surgery)

## 2011-01-26 DIAGNOSIS — Z01812 Encounter for preprocedural laboratory examination: Secondary | ICD-10-CM | POA: Insufficient documentation

## 2011-01-26 DIAGNOSIS — K469 Unspecified abdominal hernia without obstruction or gangrene: Secondary | ICD-10-CM

## 2011-01-26 DIAGNOSIS — Z01810 Encounter for preprocedural cardiovascular examination: Secondary | ICD-10-CM | POA: Insufficient documentation

## 2011-01-26 DIAGNOSIS — R0602 Shortness of breath: Secondary | ICD-10-CM | POA: Insufficient documentation

## 2011-01-26 DIAGNOSIS — Z01818 Encounter for other preprocedural examination: Secondary | ICD-10-CM | POA: Insufficient documentation

## 2011-01-26 LAB — COMPREHENSIVE METABOLIC PANEL
ALT: 24 U/L (ref 0–53)
AST: 26 U/L (ref 0–37)
Albumin: 3.7 g/dL (ref 3.5–5.2)
Alkaline Phosphatase: 86 U/L (ref 39–117)
CO2: 22 mEq/L (ref 19–32)
Chloride: 108 mEq/L (ref 96–112)
Creatinine, Ser: 0.78 mg/dL (ref 0.50–1.35)
Potassium: 4.2 mEq/L (ref 3.5–5.1)
Sodium: 140 mEq/L (ref 135–145)
Total Bilirubin: 0.8 mg/dL (ref 0.3–1.2)

## 2011-01-26 LAB — BLOOD GAS, ARTERIAL
Bicarbonate: 21.8 mEq/L (ref 20.0–24.0)
FIO2: 0.21 %
O2 Saturation: 97.9 %
Patient temperature: 98.6
pH, Arterial: 7.45 (ref 7.350–7.450)

## 2011-01-26 LAB — CBC
HCT: 41.6 % (ref 39.0–52.0)
MCHC: 35.1 g/dL (ref 30.0–36.0)
MCV: 95.2 fL (ref 78.0–100.0)
Platelets: 172 10*3/uL (ref 150–400)
RDW: 12.8 % (ref 11.5–15.5)
WBC: 7.5 10*3/uL (ref 4.0–10.5)

## 2011-01-26 LAB — URINALYSIS, ROUTINE W REFLEX MICROSCOPIC
Glucose, UA: NEGATIVE mg/dL
Leukocytes, UA: NEGATIVE
Nitrite: NEGATIVE
Specific Gravity, Urine: 1.014 (ref 1.005–1.030)
pH: 7.5 (ref 5.0–8.0)

## 2011-01-26 LAB — APTT: aPTT: 33 seconds (ref 24–37)

## 2011-01-26 LAB — PROTIME-INR: INR: 0.95 (ref 0.00–1.49)

## 2011-01-26 LAB — SURGICAL PCR SCREEN: Staphylococcus aureus: NEGATIVE

## 2011-01-26 LAB — TYPE AND SCREEN: Antibody Screen: NEGATIVE

## 2011-01-28 ENCOUNTER — Inpatient Hospital Stay (HOSPITAL_COMMUNITY)
Admission: RE | Admit: 2011-01-28 | Discharge: 2011-01-31 | DRG: 167 | Disposition: A | Discharge status: Home / Self Care | Payer: Medicare Other | Source: Ambulatory Visit | Attending: Thoracic Surgery (Cardiothoracic Vascular Surgery) | Admitting: Thoracic Surgery (Cardiothoracic Vascular Surgery)

## 2011-01-28 ENCOUNTER — Inpatient Hospital Stay (HOSPITAL_COMMUNITY): Payer: Medicare Other

## 2011-01-28 DIAGNOSIS — E871 Hypo-osmolality and hyponatremia: Secondary | ICD-10-CM | POA: Diagnosis present

## 2011-01-28 DIAGNOSIS — I4891 Unspecified atrial fibrillation: Secondary | ICD-10-CM | POA: Diagnosis present

## 2011-01-28 DIAGNOSIS — Z01818 Encounter for other preprocedural examination: Secondary | ICD-10-CM

## 2011-01-28 DIAGNOSIS — J9383 Other pneumothorax: Secondary | ICD-10-CM | POA: Diagnosis not present

## 2011-01-28 DIAGNOSIS — Z01812 Encounter for preprocedural laboratory examination: Secondary | ICD-10-CM

## 2011-01-28 DIAGNOSIS — F3289 Other specified depressive episodes: Secondary | ICD-10-CM | POA: Diagnosis present

## 2011-01-28 DIAGNOSIS — J984 Other disorders of lung: Secondary | ICD-10-CM

## 2011-01-28 DIAGNOSIS — F329 Major depressive disorder, single episode, unspecified: Secondary | ICD-10-CM | POA: Diagnosis present

## 2011-01-28 DIAGNOSIS — I10 Essential (primary) hypertension: Secondary | ICD-10-CM | POA: Diagnosis present

## 2011-01-28 HISTORY — PX: OTHER SURGICAL HISTORY: SHX169

## 2011-01-29 ENCOUNTER — Inpatient Hospital Stay (HOSPITAL_COMMUNITY): Payer: Medicare Other

## 2011-01-29 LAB — BLOOD GAS, ARTERIAL
Acid-base deficit: 2 mmol/L (ref 0.0–2.0)
O2 Content: 1 L/min
O2 Saturation: 93.4 %
pCO2 arterial: 32.6 mmHg — ABNORMAL LOW (ref 35.0–45.0)

## 2011-01-29 LAB — BASIC METABOLIC PANEL
Calcium: 8 mg/dL — ABNORMAL LOW (ref 8.4–10.5)
Creatinine, Ser: 0.89 mg/dL (ref 0.50–1.35)
GFR calc Af Amer: >60 mL/min (ref >60)

## 2011-01-29 LAB — CBC
MCH: 32.8 pg (ref 26.0–34.0)
MCV: 95.7 fL (ref 78.0–100.0)
Platelets: 163 10*3/uL (ref 150–400)
RDW: 12.8 % (ref 11.5–15.5)
WBC: 12.8 10*3/uL — ABNORMAL HIGH (ref 4.0–10.5)

## 2011-01-29 NOTE — Op Note (Signed)
NAME:  Timothy Suarez, Timothy Suarez NO.:  [id number]  MEDICAL RECORD NO.:  [id number]  LOCATION:  3314                         FACILITY:  [facility]  PHYSICIAN:  Salvatore Decent. Cornelius Moras, M.D. DATE OF BIRTH:  08-04-1925  DATE OF PROCEDURE:  01/28/2011 DATE OF DISCHARGE:                              OPERATIVE REPORT   PREOPERATIVE DIAGNOSIS:  Right chest wall hernia (lung hernia).  POSTOPERATIVE DIAGNOSIS:  Right chest wall hernia (lung hernia).  PROCEDURE:  FlexHD patch repair of chest wall hernia.  SURGEON:  Salvatore Decent. Cornelius Moras, MD  ASSISTANT:  Stephanie Acre. Dasovich, PA  ANESTHESIA:  General.  BRIEF CLINICAL NOTE:  The patient is an 77 year old gentleman who underwent right miniature thoracotomy for mitral valve repair and maze procedure in May 2010.  The patient has recently developed chest wall hernia at the site of his previous surgical scar, which has increased in size over time.  The patient now presents for elective surgical repair. The patient has been counseled regarding the indications, risks, and potential benefits of surgery.  Alternative treatment strategies have been discussed.  He understands and accepts all associated risks and desired to proceed with the operation as described.  OPERATIVE NOTE IN DETAIL:  The patient was brought to the operating room on the above-mentioned date and placed in the supine position on the operating table.  General endotracheal anesthesia was induced uneventfully.  The patient is initially intubated with a dual-lumen endotracheal tube.  A Foley catheter was placed.  Pneumatic sequential compression boots were placed on both lower extremities.  A soft roll was placed behind the right scapula to elevate the right chest, and the patient's right anterolateral chest was prepared and draped in sterile manner.  Time-out procedure was performed.  The patient's previous surgical scar on the right anterolateral chest was opened sharply.  The  subcutaneous tissues were divided with electrocautery.  Single lung ventilation was begun.  The hernia defect was entered, and the incision was opened completely.  There is a large defect between the fourth and fifth ribs where the intercostal musculature is disrupted.  There is some adhesions between the visceral pleura and the chest wall from the right middle lobe and right lower lobe.  The lung was mobilized from the chest wall circumferentially with electrocautery.  This is somewhat tedious, but ultimately the lung was satisfactory, mobilized completely from the inferior aspect of the chest wall and the diaphragm, and the majority of the anterior apical portion of the chest wall.  The raw surface area of the lung is sprayed with CoSeal tissue sealant to eliminate the possibility of any small air leaks.  The lung was briefly ventilated and appears to be intact without any sign of ongoing leaking.  The chest wall defect was closed using a 10 x 16 cm FlexHD musculoskeletal tissue patch (item number [id number], serial number [id number]).  The patch was located inside the chest between the lung and the chest wall and is tacked in place circumferentially with interrupted 0 Prolene horizontal mattress sutures.  After the patch is satisfactorily secured circumferentially, the ribs were reapproximated using interrupted #1 figure-of-eight Vicryl pericostal sutures.  Prior to closure of the chest, the pleural space was  drained with a 28-French Bard drain placed through separate stab incisions inferiorly.  After the chest was then closed and the patch secured, the soft tissues were closed in multiple layers, and the skin was closed with a running subcuticular skin closure.  The patient tolerated the procedure well, was extubated in the operating room, and transported to the recovery room in stable condition.  There were no intraoperative complications.  Estimated blood loss was 100  mL.     Salvatore Decent. Cornelius Moras, M.D.     CHO/MEDQ  D:  01/28/2011  T:  01/29/2011  Job:  [id number]  Electronically Signed by Tressie Stalker M.D. on 01/29/2011 07:44:53 AM

## 2011-01-30 ENCOUNTER — Inpatient Hospital Stay (HOSPITAL_COMMUNITY): Payer: Medicare Other

## 2011-01-31 ENCOUNTER — Inpatient Hospital Stay (HOSPITAL_COMMUNITY): Payer: Medicare Other

## 2011-02-06 ENCOUNTER — Encounter (INDEPENDENT_AMBULATORY_CARE_PROVIDER_SITE_OTHER): Payer: Self-pay

## 2011-02-06 DIAGNOSIS — J984 Other disorders of lung: Secondary | ICD-10-CM

## 2011-02-16 NOTE — Discharge Summary (Signed)
NAME:  Timothy Suarez, Timothy Suarez NO.:  [id number]  MEDICAL RECORD NO.:  [id number]  LOCATION:                                 FACILITY:  PHYSICIAN:  Salvatore Decent. Cornelius Moras, M.D. DATE OF BIRTH:  1924/10/27  DATE OF ADMISSION: DATE OF DISCHARGE:                              DISCHARGE SUMMARY   FINAL DIAGNOSES:  Right chest wall hernia, lung hernia.  SECONDARY DIAGNOSES: 1. Mitral valve prolapse status post right minithoracotomy for mitral     valve repair, maze procedure May 2010. 2. Recurrent paroxysmal atrial fibrillation status post maze     procedure. 3. Hypertension. 4. Depression. 5. Status post partial colectomy 2004. 6. Status post tympanoplasty in distant past.  IN-HOSPITAL OPERATIONS AND PROCEDURES:  Flex HD patch repair of chest wall hernia done by Dr. Cornelius Moras on January 28, 2011.  HISTORY AND PHYSICAL AND HOSPITAL COURSE:  The patient is an 77 year old gentleman who underwent right miniature thoracotomy for mitral valve repair, maze procedure May 2010.  The patient recently developed chest wall hernia at the site of his previous surgical scar, which has increased in size over time.  The patient now presents for elective surgical repair.  He was seen and evaluated by Dr. Cornelius Moras.  Dr. Cornelius Moras discussed risks and benefits of undergoing repair of chest wall hernia. The patient nods his understanding and agreed to proceed.  Surgery was scheduled for January 28, 2011.  For further details of the patient's past medical history and physical exam, please see dictated H and P.  The patient was taken to the operating room on January 28, 2011, where he underwent Flex HD patch repair chest wall hernia.  The patient tolerated this procedure well and was transferred to the PACU in stable condition. He was admitted to 3300 under 24-hour observation.  Chest x-ray postoperatively showed a 15% right apical pneumothorax.  The patient's chest tubes had been placed on suction postoperatively.  On  morning rounds of postop day #1,  the patient's chest x-ray showed some subcutaneous emphysema to the right chest wall.  It still showed stable right apical pneumothorax.  The patient was having significant pleuritic pain, postop day #1.  CT scan of the chest was ordered to rule out of loculated right anterior pneumothorax.  CT scan of the chest was done on January 29, 2011, and showed postoperative changes on the right with small right apical pneumothorax.  There were small bilateral effusions with bibasilar atelectasis.  It was felt that the patient did not require a second chest tube at this time, and left the other chest tube in place. Following morning, followup chest x-ray obtained, noted to be stable with a stable small right apical pneumothorax.  The patient had no air leak noted.  MD to evaluate this morning, possibly discontinued the patient's chest tube today.  During this time, the patient was encouraged to use his incentive spirometer.  He has been able to be weaned off oxygen with O2 saturations maintaining greater than 90% on room air.  The patient's vital signs were followed closely postoperatively.  He has remained afebrile in normal sinus rhythm. Blood pressure stable.  The patient has been up ambulating well since. He  is tolerating diet well.  No nausea, vomiting noted.  All incisions are clean, dry, and intact and healing well.  Most recent lab work shows sodium of 131, potassium 3.8, chloride 101, bicarbonate 22, BUN of 17, creatinine 0.89, glucose 153.  White blood cell count 12.8, hemoglobin 13.0, hematocrit 37.9, platelet count 162. The patient is tentatively ready for discharge to home later today versus a.m. pending removal of chest tubes and x-ray stable.  FOLLOWUP APPOINTMENTS:  A followup appointment has been arranged with Dr. Cornelius Moras for February 23, 2011, at 10:15 a.m.  The patient will need to obtain PA and lateral chest x-ray 45 minutes prior to this  appointment, as future appointment has been arranged with the nurse for February 06, 2011, at 9:30 a.m.  ACTIVITY:  The patient is instructed no driving until he is released to do so.  No lifting over 10 pounds.  He is told to ambulate 3-4 times per day.  Progress as tolerated and continue his breathing exercises.  INCISIONAL CARE:  The patient is told to shower washing his incisions using soap and water.  He is to contact the office if he develops any drainage or opening from any of his incision sites.  DIET:  The patient is educated on diet to be low fat, low salt.  DISCHARGE MEDICATIONS: 1. Percocet 5/325 one to two tablets q.4 h. p.r.n. pain. 2. Ambien 5 mg at night p.r.n. 3. Atorvastatin 40 mg at night. 4. Norvasc 5 mg daily. 5. Remeron 15 mg daily. 6. Sertraline 50 mg daily. 7. Travatan ophthalmic drops 0.004% one drops both eyes daily.     [name], PA   ______________________________ Salvatore Decent. Cornelius Moras, M.D.    KMD/MEDQ  D:  01/30/2011  T:  01/31/2011  Job:  [id number]  Electronically Signed by Cameron Proud PA on 02/10/2011 09:13:19 AM Electronically Signed by Tressie Stalker M.D. on 02/16/2011 08:53:05 AM

## 2011-02-20 ENCOUNTER — Other Ambulatory Visit: Payer: Self-pay | Admitting: Thoracic Surgery (Cardiothoracic Vascular Surgery)

## 2011-02-20 DIAGNOSIS — I050 Rheumatic mitral stenosis: Secondary | ICD-10-CM

## 2011-02-23 ENCOUNTER — Ambulatory Visit
Admission: RE | Admit: 2011-02-23 | Discharge: 2011-02-23 | Disposition: A | Discharge status: Home / Self Care | Payer: Medicare Other | Source: Ambulatory Visit | Attending: Thoracic Surgery (Cardiothoracic Vascular Surgery) | Admitting: Thoracic Surgery (Cardiothoracic Vascular Surgery)

## 2011-02-23 ENCOUNTER — Encounter (INDEPENDENT_AMBULATORY_CARE_PROVIDER_SITE_OTHER): Payer: Self-pay | Admitting: Thoracic Surgery (Cardiothoracic Vascular Surgery)

## 2011-02-23 DIAGNOSIS — I050 Rheumatic mitral stenosis: Secondary | ICD-10-CM

## 2011-02-23 DIAGNOSIS — J984 Other disorders of lung: Secondary | ICD-10-CM

## 2011-02-23 NOTE — Assessment & Plan Note (Signed)
OFFICE VISIT  Timothy Suarez, Timothy Suarez DOB:  1925/03/05                                        February 23, 2011 CHART #:  [id number]  HISTORY OF PRESENT ILLNESS:  The patient returns for routine followup status post repair of right chest wall hernia on January 28, 2011.  His postoperative recovery has been uneventful.  Since hospital discharge he has done well.  He does state that he does could not tolerate oral narcotic pain relievers because they cause him to have some disorientation and confusion.  However, he has really not had that much pain.  He states that he had one severe episode of pain 1 day when he reached for something with his right arm.  Other than that he is really not had much soreness and he has not needed any other pain relievers. He has no shortness of breath and no cough.  He otherwise feels pretty well.  PHYSICAL EXAMINATION:  Notable for well-appearing male with blood pressure 102/62, pulse 100 and regular, oxygen saturation 97% on room air.  Examination of the chest reveals mini thoracotomy incision that is healing nicely.  Breath sounds are clear to auscultation zone and symmetrical bilaterally.  Cardiovascular exam is notable for regular rate and rhythm.  No murmurs, rub,s or gallops are noted.  The abdomen is soft, nontender.  The extremities are warm and well perfused.  DIAGNOSTICS TEST:  Chest x-ray performed today at the [facility] is reviewed.  This demonstrates clear lung fields with improving opacity in the right lung field.  There are no significant pleural effusions.  No new abnormalities are noted.  IMPRESSION:  Satisfactory progress following repair of chest wall hernia.  PLAN:  I have reminded the patient to avoid any sort of heavy lifting or strenuous use of his arms or shoulders for at least another 2 months. We will see him back in 3 months to make sure he is healed completely. All of his questions have been  addressed.  Timothy Suarez, M.D. Electronically Signed  CHO/MEDQ  D:  02/23/2011  T:  02/23/2011  Job:  [id number]  cc:   Pam Drown, M.D.

## 2011-05-22 DIAGNOSIS — F329 Major depressive disorder, single episode, unspecified: Secondary | ICD-10-CM | POA: Insufficient documentation

## 2011-05-22 DIAGNOSIS — I10 Essential (primary) hypertension: Secondary | ICD-10-CM | POA: Insufficient documentation

## 2011-05-22 DIAGNOSIS — I480 Paroxysmal atrial fibrillation: Secondary | ICD-10-CM | POA: Insufficient documentation

## 2011-05-22 DIAGNOSIS — I341 Nonrheumatic mitral (valve) prolapse: Secondary | ICD-10-CM | POA: Insufficient documentation

## 2011-05-22 DIAGNOSIS — K469 Unspecified abdominal hernia without obstruction or gangrene: Secondary | ICD-10-CM | POA: Insufficient documentation

## 2011-05-25 ENCOUNTER — Encounter: Payer: Self-pay | Admitting: Thoracic Surgery (Cardiothoracic Vascular Surgery)

## 2011-05-25 ENCOUNTER — Ambulatory Visit (INDEPENDENT_AMBULATORY_CARE_PROVIDER_SITE_OTHER): Payer: Medicare Other | Admitting: Thoracic Surgery (Cardiothoracic Vascular Surgery)

## 2011-05-25 VITALS — BP 108/63 | HR 82 | Resp 16 | Ht 72.0 in | Wt 200.0 lb

## 2011-05-25 DIAGNOSIS — J984 Other disorders of lung: Secondary | ICD-10-CM

## 2011-05-25 DIAGNOSIS — K469 Unspecified abdominal hernia without obstruction or gangrene: Secondary | ICD-10-CM

## 2011-05-25 NOTE — Progress Notes (Signed)
PCP is Purcell Nails, MD, MD Referring Provider is Kary Kos, *  Chief Complaint  Patient presents with  . Follow-up    chest wall hernia repair 01/28/11    HPI:  Patient returns for routine followup now more than 3 months following mesh repair of right anterior chest wall hernia. Timothy Suarez has done very well. His activity level is quite good. Timothy Suarez denies any shortness of breath. Timothy Suarez has minimal residual soreness in his chest with exception of a very discrete area located along the medial aspect of the previous minithoracotomy scar. Otherwise she has no complaints.   Past Medical History  Diagnosis Date  . MVP (mitral valve prolapse)     S/P Rt mini thoractomy for Mitral Valve repair  . Paroxysmal atrial fibrillation     S/P Maze procedure  . HTN (hypertension)   . Depression   . Hernia     Past Surgical History  Procedure Date  . Flexhd patch repair of chest wall hernia. 01/28/11    Timothy Suarez  . Colectomy   . Tympanoplasty   . Mitral valve repair 12/27/2008    complex valvuloplasty with 28mm Memo 3D annuloplasty ring via right minithoracotomy  . Maze 12/27/2008    left side lesion set    History reviewed. No pertinent family history.  Social History History  Substance Use Topics  . Smoking status: Never Smoker   . Smokeless tobacco: Never Used  . Alcohol Use: No    Current Outpatient Prescriptions  Medication Sig Dispense Refill  . amLODipine (NORVASC) 5 MG tablet Take 5 mg by mouth daily.        . mirtazapine (REMERON) 15 MG tablet Take 15 mg by mouth at bedtime.        . sertraline (ZOLOFT) 50 MG tablet Take 50 mg by mouth daily.        . travoprost, benzalkonium, (TRAVATAN) 0.004 % ophthalmic solution Place 1 drop into both eyes at bedtime.        Marland Kitchen zolpidem (AMBIEN) 5 MG tablet Take 2.5 mg by mouth.         No Known Allergies  Review of Systems  Constitutional: Negative.   HENT: Negative.   Eyes: Negative.   Respiratory: Negative.   Cardiovascular:  Negative.        Patient denies exertional chest pain or shortness of breath  Gastrointestinal: Negative.   Genitourinary: Negative.   Musculoskeletal:       Patient has mild residual soreness in the right anterior chest, particularly located at the medial end of the previous surgical scar. This does not limited him physically.  Neurological: Negative.   Hematological: Negative.   Psychiatric/Behavioral: Negative.     BP 108/63  Pulse 82  Resp 16  Ht 6' (1.829 m)  Wt 200 lb (90.719 kg)  BMI 27.12 kg/m2  SpO2 96% Physical Exam  Constitutional: Timothy Suarez is oriented to person, place, and time. Timothy Suarez appears well-developed and well-nourished.  Neck: Normal range of motion. Neck supple. No JVD present.  Cardiovascular: Normal rate, regular rhythm and normal heart sounds.   No murmur heard.      Heart sounds are clear. There is no murmur on exam.  Pulmonary/Chest: Effort normal and breath sounds normal. Timothy Suarez has no rales. Timothy Suarez exhibits tenderness.       Very mild tenderness along the previous surgical scar. There is no hernia present.  Abdominal: Soft. Bowel sounds are normal.  Musculoskeletal: Timothy Suarez exhibits no edema.  Lymphadenopathy:    Timothy Suarez  has no cervical adenopathy.  Neurological: Timothy Suarez is alert and oriented to person, place, and time.  Skin: Skin is warm and dry.  Psychiatric: Timothy Suarez has a normal mood and affect. His behavior is normal. Judgment and thought content normal.    Impression:  Patient is doing well more than 3 months following mesh repair of right anterior chest wall hernia.   Plan:  Patient will call and return to see [place] in the future as needed. Timothy Suarez has been reminded to avoid any heavy lifting or strenuous use of his arms or shoulders for at least another 2-3 months.

## 2011-05-25 NOTE — Patient Instructions (Signed)
Avoid any heavy lifting or strenuous use of his arms or shoulders for at least another 2-3 months.

## 2011-12-03 DIAGNOSIS — H401190 Primary open-angle glaucoma, unspecified eye, stage unspecified: Secondary | ICD-10-CM | POA: Insufficient documentation

## 2011-12-24 ENCOUNTER — Other Ambulatory Visit (HOSPITAL_COMMUNITY): Payer: Self-pay | Admitting: Cardiology

## 2011-12-24 DIAGNOSIS — I2699 Other pulmonary embolism without acute cor pulmonale: Secondary | ICD-10-CM

## 2011-12-24 DIAGNOSIS — I4891 Unspecified atrial fibrillation: Secondary | ICD-10-CM

## 2011-12-25 ENCOUNTER — Ambulatory Visit (HOSPITAL_COMMUNITY)
Admission: RE | Admit: 2011-12-25 | Discharge: 2011-12-25 | Disposition: A | Discharge status: Home / Self Care | Payer: Medicare Other | Source: Ambulatory Visit | Attending: Cardiology | Admitting: Cardiology

## 2011-12-25 DIAGNOSIS — I2510 Atherosclerotic heart disease of native coronary artery without angina pectoris: Secondary | ICD-10-CM | POA: Insufficient documentation

## 2011-12-25 DIAGNOSIS — I700 Atherosclerosis of aorta: Secondary | ICD-10-CM | POA: Insufficient documentation

## 2011-12-25 DIAGNOSIS — R791 Abnormal coagulation profile: Secondary | ICD-10-CM | POA: Insufficient documentation

## 2011-12-25 DIAGNOSIS — R0989 Other specified symptoms and signs involving the circulatory and respiratory systems: Secondary | ICD-10-CM | POA: Insufficient documentation

## 2011-12-25 DIAGNOSIS — I2699 Other pulmonary embolism without acute cor pulmonale: Secondary | ICD-10-CM

## 2011-12-25 DIAGNOSIS — R0609 Other forms of dyspnea: Secondary | ICD-10-CM | POA: Insufficient documentation

## 2011-12-25 MED ORDER — IOHEXOL 350 MG/ML SOLN
100.0000 mL | Freq: Once | INTRAVENOUS | Status: AC | PRN
  Administered 2011-12-25: 100 mL via INTRAVENOUS

## 2011-12-30 ENCOUNTER — Encounter (HOSPITAL_COMMUNITY): Payer: Medicare Other

## 2012-01-01 ENCOUNTER — Other Ambulatory Visit: Payer: Self-pay | Admitting: Cardiology

## 2012-01-01 DIAGNOSIS — I712 Thoracic aortic aneurysm, without rupture: Secondary | ICD-10-CM

## 2012-12-29 ENCOUNTER — Encounter: Payer: Self-pay | Admitting: Cardiology

## 2012-12-29 ENCOUNTER — Other Ambulatory Visit: Payer: Self-pay | Admitting: Cardiology

## 2012-12-29 NOTE — H&P (Signed)
Office Visit     Patient: Suarez, Timothy E Account Number: [id number] Provider: Cynthia A. Ferguson, NP  DOB: 08/27/1924 Age: 79 Y Sex: Male Date: 12/23/2012  Phone: [phone]   Address: [address], [place], [place], [id number]  Pcp: WENDY MCNEILL          1. CF/f/u after starting med & needs lifewatch monitor ordered.        HPI:  General:  Timothy Suarez is a 79 yo male followed by Dr Lamark Schue with a hx of 1 vessel ASCAD, severe Timothy s/p minimally invasive MV repair, AI, dyslipidemia and HTN. He has a history of post-op afib but has not had a reocurrence. Echocardiogram with noted EF 45%,  Today pt returns after wearing monitor with noted PACs/PVCs, and nonsustained Atrial tachycardia, 6 beats. He was started on Toprol 25 mg po qd and he is currently wearing a 30 day monitor to make sure he is not having break through Atrial fibrillation. These test were ordered initially at Dr McNeills request due to pt's c/o of dizziness that at times progresses and he feels he is having problems with clear thought and memory during these episodes. There is concern this may be realted to PAF. He used the Toprol for 3 days and felt drained and legs felt weak and he stopped. .  Patient denies chest pain, palpitations, syncope, swelling, nor PND. His breathing is stable.       ROS:  as noted in HPI, he has hx of difficulty sleeping but states he has slept better for the last few days, no signs of bleeding, no falls nor GI complaints.       Medical History: Hypertension, Hyperlipidemia, Glaucoma - [facility], H/O BPH, transient PSA elevation 2006 - dr. Mark Ottelin, Colon Cancer - 2004, Dr. Haywood Ingram (S), Dr. James Edwards (GI), DJD, Single vessel CAD , LAD 50-70% - Dr.Tracey Allenmichael Mcpartlin , mild to Moderate Aortic Insufficiency - Dr. Tracey Edis Huish echo 2013, Severe Mitral Regurgitation with mitral valve prolapse, -Dr. Tracey Nishanth Mccaughan - mild Timothy by echo 12/2011, s/p mitral valve repair, minmally  invasive, Dr. Clarence Owen - May 2010, H/O parox Afib, prior to mitral valve repair.        Surgical History: Right cataract removal/lens implant 05/2008, Left cataract removal/lens implant 09/2008, Partial colectomy for colon cancer - H. Ingram 04/09/2003, Right knee replacement - Dr. Dan Murphy 2005, Mitral valve repair, minimal invasive approach 12/2008, colonoscopy- 06/08/07, 05/02/04, 03/26/03 .        Hospitalization/Major Diagnostic Procedure: not in past year 9/10-9/11 .        Social History:  General: History of smoking cigarettes: Never smoked. no Smoking. no Tobacco Exposure. Alcohol: yes, Rare. no Recreational drug use. Occupation: unemployed, retired. Housing: living in his home (couldn't emotionally handle move to [facility], cancelled reservation).        Medications: Taking Lipitor 40 MG Tablet 1 tablet Once a day, Taking Travatan Z 0.004 % Solution 1 drop into affected eye every evening Once a day, Taking Donepezil Hydrochloride 5 MG Tablet 1 tablet at bedtime Once a day, Taking Mirtazapine 15 MG Tablet 1 tab every evening, Taking Amlodipine Besylate 5 MG Tablet TAKE 1 TABLET ONCE A DAY ORALLY Once a day, Taking Altace 2.5 MG Capsule 1 capsule once a day - Dr. Carlito Bogert, Taking Toprol XL 25 mg Tablet Extended Release 24 Hour 1 tablet Once a day, Notes: he only took it for 3 days, Medication List reviewed and reconciled with   the patient       Allergies: citalopram: nausea: Side Effects.           Vitals: Wt 207.4, Wt change 6.2 lb, Pulse sitting 90, BP sitting 158/84 heart rate 100 initially after rushing, then 90 after 30 minutes rest.       Examination:  Cardiology, General:  GENERAL APPEARANCE: pleasant, NAD. HEENT: unremarkable. CAROTID UPSTROKE: normal, no bruit. JVD: flat. HEART SOUNDS: regular, normal S1, S2, no S3 or S4. LUNGS: no rales or wheezes. ABDOMEN: soft, non tender, positive bowel sounds,. EXTREMITIES: no leg edema. PERIPHERAL PULSES: 2 plus bilateral.             Assessment:  1. Essential hypertension, benign - 401.1  2. Atrial tachycardia - 427.89, recent monitor revealed PAT 6 beats and he is currently wearing a 30 day monitor to make sure not having Paroxysmal Atrial fibrillaiton , after further discussion he is willing to retry the Metoprolol instread of starting a new medication and while in office next week for echo I will reassess on therapy,        1. Essential hypertension, benign  Continue Amlodipine Besylate Tablet, 5 MG, TAKE 1 TABLET ONCE A DAY ORALLY, by mouth, Once a day ; Continue Altace Capsule, 2.5 MG, 1 capsule, Orally, once a day - Dr. Winifred Bodiford .       2. Atrial tachycardia  Restart Toprol XL Tablet Extended Release 24 Hour, 25 mg, 1 tablet, Orally, Once a day, Notes: restart today .  Notes: pt will restart Toprol ( Metoprolol) 25 mg daily, I will reassess Heart rate and Blood pressure while in office 12/26/12, for testing he is currently wearing a cardiac monitor, verbal report with NSR, sinus brady with rare PVC rate 58..       3.  Moderate to severe AR - 2D echo reviewed and patient was worsening AR with low normal LVF.  I will get a TEE to further evaluate the valve.     

## 2013-01-03 ENCOUNTER — Encounter (HOSPITAL_COMMUNITY)
Admission: RE | Disposition: A | Discharge status: Home / Self Care | Payer: Self-pay | Source: Ambulatory Visit | Attending: Cardiology

## 2013-01-03 ENCOUNTER — Encounter (HOSPITAL_COMMUNITY): Payer: Self-pay

## 2013-01-03 ENCOUNTER — Ambulatory Visit (HOSPITAL_COMMUNITY)
Admission: RE | Admit: 2013-01-03 | Discharge: 2013-01-03 | Disposition: A | Discharge status: Home / Self Care | Payer: Medicare Other | Source: Ambulatory Visit | Attending: Cardiology | Admitting: Cardiology

## 2013-01-03 DIAGNOSIS — N400 Enlarged prostate without lower urinary tract symptoms: Secondary | ICD-10-CM | POA: Insufficient documentation

## 2013-01-03 DIAGNOSIS — M1990 Unspecified osteoarthritis, unspecified site: Secondary | ICD-10-CM | POA: Insufficient documentation

## 2013-01-03 DIAGNOSIS — Z9889 Other specified postprocedural states: Secondary | ICD-10-CM | POA: Insufficient documentation

## 2013-01-03 DIAGNOSIS — I10 Essential (primary) hypertension: Secondary | ICD-10-CM | POA: Insufficient documentation

## 2013-01-03 DIAGNOSIS — I700 Atherosclerosis of aorta: Secondary | ICD-10-CM | POA: Insufficient documentation

## 2013-01-03 DIAGNOSIS — I498 Other specified cardiac arrhythmias: Secondary | ICD-10-CM | POA: Insufficient documentation

## 2013-01-03 DIAGNOSIS — Z79899 Other long term (current) drug therapy: Secondary | ICD-10-CM | POA: Insufficient documentation

## 2013-01-03 DIAGNOSIS — I77810 Thoracic aortic ectasia: Secondary | ICD-10-CM | POA: Insufficient documentation

## 2013-01-03 DIAGNOSIS — I080 Rheumatic disorders of both mitral and aortic valves: Secondary | ICD-10-CM | POA: Insufficient documentation

## 2013-01-03 DIAGNOSIS — I2510 Atherosclerotic heart disease of native coronary artery without angina pectoris: Secondary | ICD-10-CM | POA: Insufficient documentation

## 2013-01-03 DIAGNOSIS — E785 Hyperlipidemia, unspecified: Secondary | ICD-10-CM | POA: Insufficient documentation

## 2013-01-03 DIAGNOSIS — Z888 Allergy status to other drugs, medicaments and biological substances status: Secondary | ICD-10-CM | POA: Insufficient documentation

## 2013-01-03 DIAGNOSIS — I351 Nonrheumatic aortic (valve) insufficiency: Secondary | ICD-10-CM | POA: Diagnosis present

## 2013-01-03 DIAGNOSIS — Z85038 Personal history of other malignant neoplasm of large intestine: Secondary | ICD-10-CM | POA: Insufficient documentation

## 2013-01-03 DIAGNOSIS — H409 Unspecified glaucoma: Secondary | ICD-10-CM | POA: Insufficient documentation

## 2013-01-03 DIAGNOSIS — I340 Nonrheumatic mitral (valve) insufficiency: Secondary | ICD-10-CM

## 2013-01-03 HISTORY — PX: TEE WITHOUT CARDIOVERSION: SHX5443

## 2013-01-03 SURGERY — ECHOCARDIOGRAM, TRANSESOPHAGEAL
Anesthesia: Moderate Sedation

## 2013-01-03 MED ORDER — SODIUM CHLORIDE 0.9 % IV SOLN
INTRAVENOUS | Status: DC
  Administered 2013-01-03: 500 mL via INTRAVENOUS

## 2013-01-03 MED ORDER — LIDOCAINE VISCOUS 2 % MT SOLN
OROMUCOSAL | Status: AC
  Filled 2013-01-03: qty 15

## 2013-01-03 MED ORDER — MIDAZOLAM HCL 5 MG/ML IJ SOLN
INTRAMUSCULAR | Status: AC
  Filled 2013-01-03: qty 2

## 2013-01-03 MED ORDER — CEFAZOLIN SODIUM-DEXTROSE 2-3 GM-% IV SOLR
2.0000 g | Freq: Three times a day (TID) | INTRAVENOUS | Status: DC
  Administered 2013-01-03: 2 g via INTRAVENOUS
  Filled 2013-01-03 (×2): qty 50

## 2013-01-03 MED ORDER — LIDOCAINE VISCOUS 2 % MT SOLN
OROMUCOSAL | Status: DC | PRN
  Administered 2013-01-03: 1 via OROMUCOSAL

## 2013-01-03 MED ORDER — BUTAMBEN-TETRACAINE-BENZOCAINE 2-2-14 % EX AERO
INHALATION_SPRAY | CUTANEOUS | Status: DC | PRN
  Administered 2013-01-03: 1 via TOPICAL

## 2013-01-03 MED ORDER — FENTANYL CITRATE 0.05 MG/ML IJ SOLN
INTRAMUSCULAR | Status: DC | PRN
  Administered 2013-01-03 (×2): 25 ug via INTRAVENOUS

## 2013-01-03 MED ORDER — SODIUM CHLORIDE 0.9 % IV SOLN
2.0000 g | Freq: Once | INTRAVENOUS | Status: DC
  Administered 2013-01-03: 2 g

## 2013-01-03 MED ORDER — FENTANYL CITRATE 0.05 MG/ML IJ SOLN
INTRAMUSCULAR | Status: AC
  Filled 2013-01-03: qty 2

## 2013-01-03 MED ORDER — MIDAZOLAM HCL 10 MG/2ML IJ SOLN
INTRAMUSCULAR | Status: DC | PRN
  Administered 2013-01-03 (×2): 2 mg via INTRAVENOUS

## 2013-01-03 NOTE — Progress Notes (Signed)
  Echocardiogram Echocardiogram Transesophageal has been performed.  Timothy Suarez 01/03/2013, 9:41 AM

## 2013-01-03 NOTE — Interval H&P Note (Signed)
History and Physical Interval Note:  01/03/2013 8:25 AM  Timothy Suarez  has presented today for surgery, with the diagnosis of enlarged aorta  The various methods of treatment have been discussed with the patient and family. After consideration of risks, benefits and other options for treatment, the patient has consented to  Procedure(s): TRANSESOPHAGEAL ECHOCARDIOGRAM (TEE) (N/A) as a surgical intervention .  The patient's history has been reviewed, patient examined, no change in status, stable for surgery.  I have reviewed the patient's chart and labs.  Questions were answered to the patient's satisfaction.     Christna Kulick R

## 2013-01-03 NOTE — H&P (View-Only) (Signed)
Office Visit     Patient: Timothy Suarez, Timothy Suarez Account Number: [id number] Provider: Michaell Cowing. Emelda Fear, NP  DOB: [birth year] Age: 79 Y Sex: Male Date: 12/23/2012  Phone: [phone]   Address: [address], [address] [place], [place], [place]  Pcp: [facility]          1. CF/f/u after starting med & needs lifewatch monitor ordered.        HPI:  General:  Mr Caspers is a 79 yo male followed by Dr Mayford Knife with a hx of 1 vessel ASCAD, severe MR s/p minimally invasive MV repair, AI, dyslipidemia and HTN. He has a history of post-op afib but has not had a reocurrence. Echocardiogram with noted EF 45%,  Today pt returns after wearing monitor with noted PACs/PVCs, and nonsustained Atrial tachycardia, 6 beats. He was started on Toprol 25 mg po qd and he is currently wearing a 30 day monitor to make sure he is not having break through Atrial fibrillation. These test were ordered initially at Dr Anastasio Auerbach request due to pt's c/o of dizziness that at times progresses and he feels he is having problems with clear thought and memory during these episodes. There is concern this may be realted to PAF. He used the Toprol for 3 days and felt drained and legs felt weak and he stopped. .  Patient denies chest pain, palpitations, syncope, swelling, nor PND. His breathing is stable.       ROS:  as noted in HPI, he has hx of difficulty sleeping but states he has slept better for the last few days, no signs of bleeding, no falls nor GI complaints.       Medical History: Hypertension, Hyperlipidemia, Glaucoma - [facility], H/O BPH, transient PSA elevation 2006 - dr. Ihor Gully, Colon Cancer - 2004, Dr. Claud Kelp (S), Dr. Carman Ching (GI), DJD, Single vessel CAD , LAD 50-70% - Dr.Tracey Rand Boller , mild to Moderate Aortic Insufficiency - Dr. Eliott Nine echo 2013, Severe Mitral Regurgitation with mitral valve prolapse, -Dr. Eliott Nine - mild MR by echo 12/2011, s/p mitral valve repair, minmally  invasive, Dr. Tressie Stalker - May 2010, H/O parox Afib, prior to mitral valve repair.        Surgical History: Right cataract removal/lens implant 05/2008, Left cataract removal/lens implant 09/2008, Partial colectomy for colon cancer - H. Derrell Lolling 04/09/2003, Right knee replacement - Dr. Richardson Landry 2005, Mitral valve repair, minimal invasive approach 12/2008, colonoscopy- 06/08/07, 05/02/04, 03/26/03 .        Hospitalization/Major Diagnostic Procedure: not in past year 9/10-9/11 .        Social History:  General: History of smoking cigarettes: Never smoked. no Smoking. no Tobacco Exposure. Alcohol: yes, Rare. no Recreational drug use. Occupation: unemployed, retired. Housing: living in his home (couldn't emotionally handle move to [facility], cancelled reservation).        Medications: Taking Lipitor 40 MG Tablet 1 tablet Once a day, Taking Travatan Z 0.004 % Solution 1 drop into affected eye every evening Once a day, Taking Donepezil Hydrochloride 5 MG Tablet 1 tablet at bedtime Once a day, Taking Mirtazapine 15 MG Tablet 1 tab every evening, Taking Amlodipine Besylate 5 MG Tablet TAKE 1 TABLET ONCE A DAY ORALLY Once a day, Taking Altace 2.5 MG Capsule 1 capsule once a day - Dr. Mayford Knife, Taking Toprol XL 25 mg Tablet Extended Release 24 Hour 1 tablet Once a day, Notes: he only took it for 3 days, Medication List reviewed and reconciled with  the patient       Allergies: citalopram: nausea: Side Effects.           Vitals: Wt 207.4, Wt change 6.2 lb, Pulse sitting 90, BP sitting 158/84 heart rate 100 initially after rushing, then 90 after 30 minutes rest.       Examination:  Cardiology, General:  GENERAL APPEARANCE: pleasant, NAD. HEENT: unremarkable. CAROTID UPSTROKE: normal, no bruit. JVD: flat. HEART SOUNDS: regular, normal S1, S2, no S3 or S4. LUNGS: no rales or wheezes. ABDOMEN: soft, non tender, positive bowel sounds,. EXTREMITIES: no leg edema. PERIPHERAL PULSES: 2 plus bilateral.             Assessment:  1. Essential hypertension, benign - 401.1  2. Atrial tachycardia - 427.89, recent monitor revealed PAT 6 beats and he is currently wearing a 30 day monitor to make sure not having Paroxysmal Atrial fibrillaiton , after further discussion he is willing to retry the Metoprolol instread of starting a new medication and while in office next week for echo I will reassess on therapy,        1. Essential hypertension, benign  Continue Amlodipine Besylate Tablet, 5 MG, TAKE 1 TABLET ONCE A DAY ORALLY, by mouth, Once a day ; Continue Altace Capsule, 2.5 MG, 1 capsule, Orally, once a day - Dr. Mayford Knife .       2. Atrial tachycardia  Restart Toprol XL Tablet Extended Release 24 Hour, 25 mg, 1 tablet, Orally, Once a day, Notes: restart today .  Notes: pt will restart Toprol ( Metoprolol) 25 mg daily, I will reassess Heart rate and Blood pressure while in office 12/26/12, for testing he is currently wearing a cardiac monitor, verbal report with NSR, sinus brady with rare PVC rate 58..       3.  Moderate to severe AR - 2D echo reviewed and patient was worsening AR with low normal LVF.  I will get a TEE to further evaluate the valve.

## 2013-01-03 NOTE — CV Procedure (Signed)
Procedure Note  Procedure:  Transesophageal echocardiogram Operator:  Armanda Magic, MD Indications:  Aortic insufficiency with low normal LVF Complications: IV Meds:  Versed 4mg  IV, Fentanyl IV, Ampicillin 2gm IV  Results:  Low normal LVF Normal RV and RA Normal TV with mild TR Normal PV with trivial PR S/P minimally invasive MV repair with elongated anterior mitral   Valve leaflet and mild MR Trileaflet AV with mildly thickened leaflets and moderate AR which   Is eccentric and hits the anterior mitral valve leaflet. Normal LA with oversewn LA appendage Mildly dilated aorta at the sinuses of Valsalva measuring  4.2cm and 3.5cm in the ascending aorta. Mild to moderate atherosclerosis of the thoracic aorta  The patient tolerated the procedure well without any complications.

## 2013-01-04 ENCOUNTER — Other Ambulatory Visit: Payer: Medicare Other

## 2013-01-04 ENCOUNTER — Encounter (HOSPITAL_COMMUNITY): Payer: Self-pay | Admitting: Cardiology

## 2013-12-19 LAB — LIPID PANEL
CHOLESTEROL: 130 mg/dL (ref 0–200)
HDL: 42 mg/dL (ref 35–70)
LDL Cholesterol: 46 mg/dL
Triglycerides: 209 mg/dL — AB (ref 40–160)

## 2014-01-04 ENCOUNTER — Other Ambulatory Visit (HOSPITAL_COMMUNITY): Payer: Medicare Other

## 2014-01-17 ENCOUNTER — Other Ambulatory Visit (HOSPITAL_COMMUNITY): Payer: Medicare Other

## 2014-02-08 ENCOUNTER — Encounter: Payer: Self-pay | Admitting: Internal Medicine

## 2014-02-08 ENCOUNTER — Other Ambulatory Visit: Payer: Self-pay

## 2014-02-08 ENCOUNTER — Ambulatory Visit (INDEPENDENT_AMBULATORY_CARE_PROVIDER_SITE_OTHER): Payer: Medicare Other | Admitting: Internal Medicine

## 2014-02-08 VITALS — BP 158/84 | HR 71 | Temp 97.8°F | Ht 71.0 in | Wt 211.0 lb

## 2014-02-08 DIAGNOSIS — F330 Major depressive disorder, recurrent, mild: Secondary | ICD-10-CM

## 2014-02-08 DIAGNOSIS — M6281 Muscle weakness (generalized): Secondary | ICD-10-CM | POA: Insufficient documentation

## 2014-02-08 DIAGNOSIS — C189 Malignant neoplasm of colon, unspecified: Secondary | ICD-10-CM

## 2014-02-08 DIAGNOSIS — G4700 Insomnia, unspecified: Secondary | ICD-10-CM

## 2014-02-08 DIAGNOSIS — H6123 Impacted cerumen, bilateral: Secondary | ICD-10-CM

## 2014-02-08 DIAGNOSIS — R42 Dizziness and giddiness: Secondary | ICD-10-CM

## 2014-02-08 DIAGNOSIS — I2510 Atherosclerotic heart disease of native coronary artery without angina pectoris: Secondary | ICD-10-CM

## 2014-02-08 DIAGNOSIS — H6120 Impacted cerumen, unspecified ear: Secondary | ICD-10-CM | POA: Insufficient documentation

## 2014-02-08 DIAGNOSIS — H409 Unspecified glaucoma: Secondary | ICD-10-CM

## 2014-02-08 MED ORDER — SERTRALINE HCL 25 MG PO TABS: 25.0000 mg | ORAL_TABLET | Freq: Every day | ORAL | Status: DC

## 2014-02-08 MED ORDER — ATORVASTATIN CALCIUM 40 MG PO TABS: 40.0000 mg | ORAL_TABLET | Freq: Every day | ORAL | Status: DC

## 2014-02-08 MED ORDER — SERTRALINE HCL 100 MG PO TABS: 100.0000 mg | ORAL_TABLET | Freq: Every day | ORAL | Status: DC

## 2014-02-08 MED ORDER — ZOLPIDEM TARTRATE 5 MG PO TABS: 5.0000 mg | ORAL_TABLET | Freq: Every evening | ORAL | Status: DC | PRN

## 2014-02-08 NOTE — Progress Notes (Signed)
Patient ID: Timothy Suarez, male   DOB: September 25, 1924, 80 y.o.   MRN: [id number]   Location:  [facility]  Code Status: DNR, also has living will and HCPOA  Allergies  Allergen Reactions  . Citalopram Nausea Only    Chief Complaint  Patient presents with  . Medical Management of Chronic Issues    New Patient - switching doctors. Feels he needs some one for seniors.  . Dizziness    for 15 years, has gotten worse over the years. Starts about 9:00am then stops about 6:00pm. Its affects his thinking and balance. Previous doctor did  all kind of test for heart and kpt writting Rx's , wants to get off some.     HPI: Patient is a 80 y.o. male seen in the office today to establish with the practice.  He wants to establish with a geriatrician.    In last year, dizziness has gotten worse.  Slays him a little now.  Dr. Addison Lank had seen him.  Has felt it was heart related.  Had "every test on the heart" but all have been unremarkable.  Had a catheterization done--when came out of it, went into afib.  Dr. Ilda Foil was concerned about it.  Went to Dr. Ricard Dillon and he did mitral valve repair.  Has had no problem since.    Had been skipping meals.  Lives in independent living facility at [facility]. Feels that eating more regularly has helped a little.  When gets very dizzy, mind is affected and balance is completely gone.   Cannot write then.  Feels like he has a cloud in his head.  Vision gets somewhat affected.  Starts early morning and lasts until the evening.  Stopped several meds b/c of dizziness and muscle weakness being side effects.  Hasn't helped.  Sometimes dizziness will come on when goes from sitting to standing.  Not a spinning sensation (not vertigo).  Has never fallen from this.  Is concerned he will.  Uses cane.   Urinates frequently.  Drinks a few--2 drinks per day, some days none.  Has not had incontinence.  He also has depression based on the  PHQ-9.  Is taking mirtazapine.    He was a Restaurant manager, fast food.  Is from here and grew up in [place].    Has never had CT or MRI brain.    Does not take ambien 2.5mg  nightly.  Last filled in April for 30 pills.  Never noticed feeling differently the next morning.  Trazodone was recommended in place of ambien by insurance, but caused terrible dry mouth.    No longer taking a baby asa.  Never had bleeding problems.   Is taking lipitor for many years.  Also takes beta blocker and ace.  BP is a little higher than it usually is.  BP in 694W-546E systolic typically.    Doesn't have stamina anymore due to muscle weakness.    His wife had AD.  Has been told he had depression.  Has stopped his mirtazapine.  No difference when stopped remeron.  Records indicate he had nausea with celexa. Scored 8 on PHQ-9.  Wasn't conscious of his mood after his wife's death.  Antidepressant did help him at the time.  Didn't really associate the stress of caring for her with depression, but others felt it did.    Does acknowledge a "big memory problem".    Review of Systems:  Review of Systems  Constitutional: Negative for fever,  chills, weight loss and malaise/fatigue.  HENT: Positive for hearing loss. Negative for congestion.        Wears hearing aides--recently worse  Eyes:       Wears glasses  Respiratory: Negative for cough and shortness of breath.   Cardiovascular: Negative for chest pain, palpitations and leg swelling.       H/o mitral valve repair  Gastrointestinal: Negative for abdominal pain, diarrhea, constipation, blood in stool and melena.       Prior colon cancer s/p surgery, gets cscopes q 5 years  Genitourinary: Negative for dysuria, urgency, frequency and hematuria.       Weak stream  Musculoskeletal: Negative for falls and myalgias.  Skin: Negative for itching and rash.  Neurological: Positive for dizziness and weakness. Negative for focal weakness.  Endo/Heme/Allergies: Does not bruise/bleed  easily.  Psychiatric/Behavioral: Positive for memory loss. The patient has insomnia.     Past Medical History  Diagnosis Date  . MVP (mitral valve prolapse)     S/P Rt mini thoractomy for Mitral Valve repair  . Paroxysmal atrial fibrillation     S/P Maze procedure  . HTN (hypertension)   . Depression   . Hernia   . Aortic valve insufficiency, acquired   . Dyslipidemia   . Glaucoma   . BPH (benign prostatic hyperplasia)   . Cancer     colon s/p partial colectomy  . Coronary artery disease     50-70% LAD    Past Surgical History  Procedure Laterality Date  . Flexhd patch repair of chest wall hernia.  01/28/11    [name]  . Colectomy  2004    Dr [name]  . Tympanoplasty    . Mitral valve repair  12/27/2008    complex valvuloplasty with 73mm Memo 3D annuloplasty ring via right minithoracotomy  . Maze  12/27/2008    left side lesion set  . Mitral valve repair      minimally invasive  . Tee without cardioversion N/A 01/03/2013    Procedure: TRANSESOPHAGEAL ECHOCARDIOGRAM (TEE);  Surgeon: Sueanne Margarita, MD;  Location: [place];  Service: Cardiovascular;  Laterality: N/A;  . Total knee arthroplasty Right 2003    Social History:   reports that he has never smoked. He has never used smokeless tobacco. He reports that he does not drink alcohol or use illicit drugs.  Family History  Problem Relation Age of Onset  . Cancer Mother     lymphoma  . Cancer Father     pancreatic    Medications: Patient's Medications  New Prescriptions   No medications on file  Previous Medications   AMLODIPINE (NORVASC) 5 MG TABLET    Take 5 mg by mouth daily.     ATORVASTATIN (LIPITOR) 40 MG TABLET    Take 40 mg by mouth daily.   METOPROLOL SUCCINATE (TOPROL-XL) 25 MG 24 HR TABLET    Take 25 mg by mouth daily.   MIRTAZAPINE (REMERON) 15 MG TABLET    Take 15 mg by mouth at bedtime.     RAMIPRIL (ALTACE) 2.5 MG CAPSULE    Take 2.5 mg by mouth daily.   TRAVOPROST, BENZALKONIUM, (TRAVATAN)  0.004 % OPHTHALMIC SOLUTION    Place 1 drop into both eyes at bedtime.     ZOLPIDEM (AMBIEN) 5 MG TABLET    Take 5 mg by mouth at bedtime as needed for sleep.  Modified Medications   No medications on file  Discontinued Medications   No medications on file  Physical Exam: Filed Vitals:   02/08/14 0855  BP: 158/84  Pulse: 71  Temp: 97.8 F (36.6 C)  TempSrc: Oral  Height: 5\' 11"  (1.803 m)  Weight: 211 lb (95.709 kg)  SpO2: 97%  Physical Exam  Constitutional: He is oriented to person, place, and time. He appears well-developed and well-nourished. No distress.  HENT:  Head: Normocephalic and atraumatic.  Cardiovascular: Intact distal pulses.   Has triplet beats  Pulmonary/Chest: Effort normal and breath sounds normal. He has no rales.  Abdominal: Soft. Bowel sounds are normal. He exhibits no distension and no mass. There is no tenderness.  Musculoskeletal: Normal range of motion. He exhibits no edema and no tenderness.  Walks with cane  Neurological: He is alert and oriented to person, place, and time. He has normal reflexes.  Skin: Skin is warm and dry.  Several ecchymoses  Psychiatric: He has a normal mood and affect.    Labs reviewed:  Lab Results  Component Value Date   HGBA1C  Value: 5.3 (NOTE) The ADA recommends the following therapeutic goal for glycemic control related to Hgb A1c measurement: Goal of therapy: <6.5 Hgb A1c  Reference: American Diabetes Association: Clinical Practice Recommendations 2010, Diabetes Care, 2010, 33: (Suppl  1). 03/16/2009   Assessment/Plan 1. Major depressive disorder, recurrent episode, mild -did not tolerate celexa in the past -also has stopped remeron and did not notice any improvement in his mood or dizziness -will start zoloft 25mg  for one week, then 50mg  for 3 wks, then increase to 100mg  daily  2. Insomnia - zolpidem (AMBIEN) 5 MG tablet; Take 1 tablet (5 mg total) by mouth at bedtime as needed for sleep.  Dispense: 30 tablet;  Refill: 0 - has tolerated this well and denies any impact of taking or not taking this on his dizziness so will continue  3. Dizziness and giddiness - pt feels like this is due to his mood, but that would be unusual  - will treat depression and see if it helps -no carotid bruits on exam but likely has some carotid stenosis given his prior "TIA" in record and his CAD -will r/o orthostatic hypotension: - Orthostatic vital signs were unremarkable:  128/58, 62 lying, 118/68, 62 sitting, and 124/78, 60 standing - Ear wax removal as he also has impacted cerumen which can contribute -does not seem to be vertigo -has never had brain imaging so would also consider this if he does not improve with mood treatment  4. Generalized muscle weakness - will consider stopping lipitor and trying an alternative to see if this improves, but may simply be due to aging at this time  5. Cerumen impaction, bilateral - Ear wax removal done today  6. Glaucoma -cont current drops, keep f/u with ophtho  7. Colon cancer - prior colon surgery -gets cscope q 5 years--need records  8. Coronary artery disease involving native coronary artery of native heart without angina pectoris -stable and asymptomatic. -Did not tolerate toprol due to muscle weakness -Cont norvasc -Cont off ace as no prior MI or hyperglycemia   Labs/tests ordered:  None today Next appt:  6 wks for mmse, ekg, and f/u on depression

## 2014-02-08 NOTE — Patient Instructions (Addendum)
Please begin zoloft therapy for your depression.   Start with zoloft 25mg  daily for 1 week, then increase to 2 tabs (total 50mg ) daily for 3 wks, then fill second prescription for 100mg  daily.    Otherwise keep your meds the same.  I would like you to consider starting a baby aspirin 81 mg because of your coronary artery disease.

## 2014-03-22 ENCOUNTER — Ambulatory Visit: Payer: Medicare Other | Admitting: Internal Medicine

## 2014-04-02 ENCOUNTER — Encounter: Payer: Self-pay | Admitting: Internal Medicine

## 2014-04-02 ENCOUNTER — Ambulatory Visit (INDEPENDENT_AMBULATORY_CARE_PROVIDER_SITE_OTHER): Payer: Medicare Other | Admitting: Internal Medicine

## 2014-04-02 VITALS — BP 128/80 | HR 85 | Temp 97.6°F | Resp 12 | Ht 70.5 in | Wt 213.0 lb

## 2014-04-02 DIAGNOSIS — G3184 Mild cognitive impairment, so stated: Secondary | ICD-10-CM

## 2014-04-02 DIAGNOSIS — R413 Other amnesia: Secondary | ICD-10-CM

## 2014-04-02 DIAGNOSIS — F330 Major depressive disorder, recurrent, mild: Secondary | ICD-10-CM

## 2014-04-02 DIAGNOSIS — I10 Essential (primary) hypertension: Secondary | ICD-10-CM

## 2014-04-02 DIAGNOSIS — E669 Obesity, unspecified: Secondary | ICD-10-CM | POA: Insufficient documentation

## 2014-04-02 DIAGNOSIS — R9431 Abnormal electrocardiogram [ECG] [EKG]: Secondary | ICD-10-CM | POA: Insufficient documentation

## 2014-04-02 DIAGNOSIS — R42 Dizziness and giddiness: Secondary | ICD-10-CM

## 2014-04-02 DIAGNOSIS — I2510 Atherosclerotic heart disease of native coronary artery without angina pectoris: Secondary | ICD-10-CM

## 2014-04-02 DIAGNOSIS — G4700 Insomnia, unspecified: Secondary | ICD-10-CM

## 2014-04-02 MED ORDER — MECLIZINE HCL 32 MG PO TABS: 32.0000 mg | ORAL_TABLET | Freq: Every day | ORAL | Status: DC | PRN

## 2014-04-02 MED ORDER — ZOLPIDEM TARTRATE 5 MG PO TABS: 5.0000 mg | ORAL_TABLET | Freq: Every evening | ORAL | Status: DC | PRN

## 2014-04-02 NOTE — Progress Notes (Signed)
Passed clock drawing 

## 2014-04-02 NOTE — Progress Notes (Signed)
Patient ID: Timothy Suarez, male   DOB: [birth year], 80 y.o.   MRN: [id number]   Location:  [facility]  Code Status: need to discuss next time  Allergies  Allergen Reactions  . Citalopram Nausea Only  . Trazodone And Nefazodone     Dry mouth    Chief Complaint  Patient presents with  . Medical Management of Chronic Issues    6 week follow-up     HPI: Patient is a 80 y.o. male seen in the office today for 6 week f/u on mood and dizziness.    His EKG done today shows a prolong Q-Tc of 518 and first degree AV block.  He was seen by Dr. Radford Pax in the hospital.   Had afib in past and no problems since valve surgery.  PHQ-9 today 0 vs. 8 last visit. Could not tolerate zoloft--made him weak.  He says his mood comes and goes.  If he is dizzy, he is out of it.  Lately, dizziness hasn't been that bad lately--no "severe phase" as he's calling it.  No longer thinks the dizziness and mood are related.  Dizziness starts around 9am and quits around 7pm.  Had not been eating right and thought that was better after he did and drank adequate water.  But still does get some of it. Takes all of his meds at night.      Hearing aide is acting up today.  Cannot hear well  Scored 25/30 on mmse, did pass clock.  Missed on orientation and recall.    Had surgery on his nose almost 2 wks ago--Dr. Delice Lesch.  Sat in office all day long.  No problems and no dizziness for 3-4 days after that.  Doesn't notice dizziness if he is involved in good conversation at dinner at meals.  Notes no correlation with ambien use at bedtime--sometimes takes 1/2 of it.  Feels better if he has taken it.  Still denies it being a spinning.   Review of Systems:  ROS   Past Medical History  Diagnosis Date  . MVP (mitral valve prolapse)     S/P Rt mini thoractomy for Mitral Valve repair  . Paroxysmal atrial fibrillation     S/P Maze procedure  . HTN (hypertension)   . Depression   .  Hernia   . Aortic valve insufficiency, acquired   . Dyslipidemia   . Glaucoma   . BPH (benign prostatic hyperplasia)   . Colon cancer     colon s/p partial colectomy  . Coronary artery disease     50-70% LAD    Past Surgical History  Procedure Laterality Date  . Flexhd patch repair of chest wall hernia.  01/28/11    [name]  . Colectomy  2004    Dr [name]  . Tympanoplasty    . Mitral valve repair  12/27/2008    complex valvuloplasty with 20mm Memo 3D annuloplasty ring via right minithoracotomy  . Maze  12/27/2008    left side lesion set  . Mitral valve repair      minimally invasive  . Tee without cardioversion N/A 01/03/2013    Procedure: TRANSESOPHAGEAL ECHOCARDIOGRAM (TEE);  Surgeon: Sueanne Margarita, MD;  Location: [place];  Service: Cardiovascular;  Laterality: N/A;  . Total knee arthroplasty Right 2003    Social History:   reports that he has never smoked. He has never used smokeless tobacco. He reports that he does not drink alcohol or use illicit  drugs.  Family History  Problem Relation Age of Onset  . Cancer Mother     lymphoma  . Cancer Father     pancreatic    Medications: Patient's Medications  New Prescriptions   No medications on file  Previous Medications   AMLODIPINE (NORVASC) 5 MG TABLET    Take 5 mg by mouth daily.     ATORVASTATIN (LIPITOR) 40 MG TABLET    Take 1 tablet (40 mg total) by mouth daily at 6 PM.   MIRTAZAPINE (REMERON) 15 MG TABLET    Take 15 mg by mouth at bedtime.     SERTRALINE (ZOLOFT) 100 MG TABLET    Take 1 tablet (100 mg total) by mouth daily.   SERTRALINE (ZOLOFT) 25 MG TABLET    Take 1 tablet (25 mg total) by mouth daily. For one week, then increase to 50 mg (2 tabs for 3 wks)   TRAVOPROST, BENZALKONIUM, (TRAVATAN) 0.004 % OPHTHALMIC SOLUTION    Place 1 drop into both eyes at bedtime.     ZOLPIDEM (AMBIEN) 5 MG TABLET    Take 1 tablet (5 mg total) by mouth at bedtime as needed for sleep.  Modified Medications   No medications  on file  Discontinued Medications   No medications on file     Physical Exam: Filed Vitals:   04/02/14 1521  BP: 128/80  Pulse: 85  Temp: 97.6 F (36.4 C)  TempSrc: Oral  Resp: 12  Height: 5' 10.5" (1.791 m)  Weight: 213 lb (96.616 kg)  SpO2: 98%  Physical Exam   Labs reviewed: Lipid Panel:  Recent Labs  12/19/13  CHOL 130  HDL 42  LDLCALC 46  TRIG 209*   Lab Results  Component Value Date   HGBA1C  Value: 5.3 (NOTE) The ADA recommends the following therapeutic goal for glycemic control related to Hgb A1c measurement: Goal of therapy: <6.5 Hgb A1c  Reference: American Diabetes Association: Clinical Practice Recommendations 2010, Diabetes Care, 2010, 33: (Suppl  1). 2020/09/908   EKG today sinus rhythm, rate 83, PVC present, first degree av block and prolonged QTc of 518  Assessment/Plan 1. Dizziness and giddiness - story is unusual -does not fit well with anything -has had negative cardiac workup thus far - EKG 12-Lead today with prolonged QT--? Symptoms from this, but they are consistent each day so that does not make sense -wants to try a medicine for dizziness--discussed that anxiety meds are bad for this -agrees to try meclizine (ANTIVERT) 12.5 MG tablet by mouth daily as needed for dizziness.  Dispense: 30 tablet; Refill: 3 - I also want to evaluate neurologic causes as well with his memory loss: -US Carotid Bilateral; Future - CT Head Wo Contrast; Future  2. Insomnia - will cont ambien low dose (uses 1/2 tab sometimes, and does not correlate with his dizziness, does not do well when cannot have on hand) - zolpidem (AMBIEN) 5 MG tablet; Take 1 tablet (5 mg total) by mouth at bedtime as needed for sleep.  Dispense: 30 tablet; Refill: 1  3. Unspecified essential hypertension - bp at goal with current meds - EKG 12-Lead  4. Major depressive disorder, recurrent episode, mild -seems mood is triggered by his dizziness episodes he tells me -today no longer  triggered for depression on phq2  5. Coronary artery disease involving native coronary artery of native heart without angina pectoris -cont current regimen - EKG 12-Lead  6. Mild cognitive impairment - newly diagnosed today with MMSE 25/30 missing  on recall and attention -will r/o reversible causes that are classic: - TSH - B12 and Folate Panel -no functional losses known at present -lives at [facility]  7. Memory loss - as above - TSH - B12 and Folate Panel - CBC With differential/Platelet - Comprehensive metabolic panel -also obtain CT brain and carotid doppplers due to this--suspect some old strokes he is unaware of causing his memory loss  Labs/tests ordered:   Orders Placed This Encounter  Procedures  . US Carotid Bilateral    Standing Status: Future     Number of Occurrences:      Standing Expiration Date: 06/02/2015    Order Specific Question:  Preferred imaging location?    Answer:  [facility]     Comments:  dizziness that is episodic  . CT Head Wo Contrast    Standing Status: Future     Number of Occurrences:      Standing Expiration Date: 07/04/2015    Order Specific Question:  Reason for Exam (SYMPTOM  OR DIAGNOSIS REQUIRED)    Answer:  dizziness, ? any prior strokes    Order Specific Question:  Preferred imaging location?    Answer:  [facility]     Comments:  dizziness that is episodic  . TSH  . B12 and Folate Panel  . CBC With differential/Platelet  . Comprehensive metabolic panel  . EKG 12-Lead    Next appt:  6 wks f/u on memory and dizziness

## 2014-04-03 LAB — CBC WITH DIFFERENTIAL
Basophils Absolute: 0 10*3/uL (ref 0.0–0.2)
Basos: 0 %
Eos: 3 %
Eosinophils Absolute: 0.3 10*3/uL (ref 0.0–0.4)
HCT: 44.2 % (ref 37.5–51.0)
Hemoglobin: 15.1 g/dL (ref 12.6–17.7)
Immature Grans (Abs): 0 10*3/uL (ref 0.0–0.1)
Immature Granulocytes: 0 %
Lymphocytes Absolute: 2.5 10*3/uL (ref 0.7–3.1)
Lymphs: 24 %
MCH: 32.4 pg (ref 26.6–33.0)
MCHC: 34.2 g/dL (ref 31.5–35.7)
MCV: 95 fL (ref 79–97)
Monocytes Absolute: 0.9 10*3/uL (ref 0.1–0.9)
Monocytes: 9 %
Neutrophils Absolute: 6.6 10*3/uL (ref 1.4–7.0)
Neutrophils Relative %: 64 %
Platelets: 208 10*3/uL (ref 150–379)
RBC: 4.66 x10E6/uL (ref 4.14–5.80)
RDW: 13.1 % (ref 12.3–15.4)
WBC: 10.3 10*3/uL (ref 3.4–10.8)

## 2014-04-03 LAB — COMPREHENSIVE METABOLIC PANEL
ALT: 12 IU/L (ref 0–44)
AST: 13 IU/L (ref 0–40)
Albumin/Globulin Ratio: 1.6 (ref 1.1–2.5)
Albumin: 4.1 g/dL (ref 3.5–4.7)
Alkaline Phosphatase: 107 IU/L (ref 39–117)
BUN/Creatinine Ratio: 14 (ref 10–22)
BUN: 15 mg/dL (ref 8–27)
CO2: 21 mmol/L (ref 18–29)
Calcium: 9.1 mg/dL (ref 8.6–10.2)
Chloride: 103 mmol/L (ref 97–108)
Creatinine, Ser: 1.08 mg/dL (ref 0.76–1.27)
GFR calc Af Amer: 71 mL/min/{1.73_m2} (ref >59)
GFR calc non Af Amer: 61 mL/min/{1.73_m2} (ref >59)
Globulin, Total: 2.5 g/dL (ref 1.5–4.5)
Glucose: 101 mg/dL — ABNORMAL HIGH (ref 65–99)
Potassium: 4.1 mmol/L (ref 3.5–5.2)
Sodium: 142 mmol/L (ref 134–144)
Total Bilirubin: 0.4 mg/dL (ref 0.0–1.2)
Total Protein: 6.6 g/dL (ref 6.0–8.5)

## 2014-04-03 LAB — B12 AND FOLATE PANEL
Folate: 15.5 ng/mL (ref >3.0)
Vitamin B-12: 495 pg/mL (ref 211–946)

## 2014-04-03 LAB — TSH: TSH: 2.05 u[IU]/mL (ref 0.450–4.500)

## 2014-04-04 ENCOUNTER — Encounter: Payer: Self-pay | Admitting: *Deleted

## 2014-04-06 ENCOUNTER — Ambulatory Visit
Admission: RE | Admit: 2014-04-06 | Discharge: 2014-04-06 | Disposition: A | Discharge status: Home / Self Care | Payer: Medicare Other | Source: Ambulatory Visit | Attending: Internal Medicine | Admitting: Internal Medicine

## 2014-04-06 DIAGNOSIS — R42 Dizziness and giddiness: Secondary | ICD-10-CM

## 2014-04-09 ENCOUNTER — Other Ambulatory Visit: Payer: Self-pay | Admitting: *Deleted

## 2014-04-09 DIAGNOSIS — R42 Dizziness and giddiness: Secondary | ICD-10-CM

## 2014-04-12 ENCOUNTER — Ambulatory Visit (INDEPENDENT_AMBULATORY_CARE_PROVIDER_SITE_OTHER): Payer: Medicare Other | Admitting: Neurology

## 2014-04-12 ENCOUNTER — Encounter: Payer: Self-pay | Admitting: Neurology

## 2014-04-12 VITALS — BP 151/85 | HR 87 | Temp 96.7°F | Ht 69.0 in | Wt 212.0 lb

## 2014-04-12 DIAGNOSIS — R42 Dizziness and giddiness: Secondary | ICD-10-CM

## 2014-04-12 DIAGNOSIS — R5381 Other malaise: Secondary | ICD-10-CM

## 2014-04-12 NOTE — Progress Notes (Signed)
Subjective:    Patient ID: Timothy Suarez is a 80 y.o. male.  HPI    Star Age, MD, PhD [facility] [address], [address] P.O. [place], [place] [address]  Dear Dr. Mariea Clonts,   I saw your patient, Timothy Suarez, upon your kind request in my neurologic clinic today for initial consultation of his dizziness. The patient is accompanied by his daughter today. As you know, Timothy Suarez is a very pleasant 80 year old right-handed gentleman with an underlying medical history of skin cancers, paroxysmal atrial fibrillation, hypertension, depression, aortic valve insufficiency, insomnia, on Ambien 5 mg, hyperlipidemia, glaucoma, BPH, colon cancer, status post partial colectomy, coronary artery disease, and mitral valve prolapse, and memory loss, who reports a one year hx of dizziness. This is intermittent and can be with sitting, or standing, no vertigo, no N/V, no HAs.  He had a head CT without contrast on 04/06/2014: The showed atrophy and chronic small vessel disease type changes, physiologic bilateral basal ganglia calcifications, no acute intracranial abnormality. I personally reviewed the images through the PACS system. He had bilateral carotid Doppler studies on 04/06/2014: They showed no hemodynamically significant stenosis in the bilateral ICAs. He describes some foggy headedness sometimes. Blood work from 04/02/2014 showed normal TSH, normal B12 and folate, normal lipid profile with the exception of total cholesterol borderline at 219, and normal CMP.  He has seen a cardiologist and had a monitor and echocardiogram and had a TEE in 5/14.  He lives in [place] at [facility]. He has been using a cane for the past several months, and while he thankfully has not fallen, he feels that he would fall without the cane.  While there is no actual specific trigger, he has noted that his dizziness tends to be worse when he is worked up about something or  brushed or has been working physically more than usual. He has also noted that when he eats he feels better. He has had a tendency to skip meals.  His Past Medical History Is Significant For: Past Medical History  Diagnosis Date  . MVP (mitral valve prolapse)     S/P Rt mini thoractomy for Mitral Valve repair  . Paroxysmal atrial fibrillation     S/P Maze procedure  . HTN (hypertension)   . Depression   . Hernia   . Aortic valve insufficiency, acquired   . Dyslipidemia   . Glaucoma   . BPH (benign prostatic hyperplasia)   . Colon cancer     colon s/p partial colectomy  . Coronary artery disease     50-70% LAD    His Past Surgical History Is Significant For: Past Surgical History  Procedure Laterality Date  . Flexhd patch repair of chest wall hernia.  01/28/11    [name]  . Colectomy  2004    Dr [name]  . Tympanoplasty    . Mitral valve repair  12/27/2008    complex valvuloplasty with 62mm Memo 3D annuloplasty ring via right minithoracotomy  . Maze  12/27/2008    left side lesion set  . Mitral valve repair      minimally invasive  . Tee without cardioversion N/A 01/03/2013    Procedure: TRANSESOPHAGEAL ECHOCARDIOGRAM (TEE);  Surgeon: Sueanne Margarita, MD;  Location: [place];  Service: Cardiovascular;  Laterality: N/A;  . Total knee arthroplasty Right 2003    His Family History Is Significant For: Family History  Problem Relation Age of Onset  . Cancer Mother  lymphoma  . Cancer Father     pancreatic    His Social History Is Significant For: History   Social History  . Marital Status: Widowed    Spouse Name: N/A    Number of Children: 2  . Years of Education: 14   Occupational History  . retired Nutritional therapist     Social History Main Topics  . Smoking status: Never Smoker   . Smokeless tobacco: Never Used  . Alcohol Use: No  . Drug Use: No  . Sexual Activity: No   Other Topics Concern  . None   Social History Narrative   Widowed   Never smoked    Alcohol  Occasionally    Exercise - walking the [employer] with cane   [place], HCPOA                His Allergies Are:  Allergies  Allergen Reactions  . Citalopram Nausea Only  . Trazodone And Nefazodone     Dry mouth  :   His Current Medications Are:  Outpatient Encounter Prescriptions as of 04/12/2014  Medication Sig  . amLODipine (NORVASC) 5 MG tablet Take 5 mg by mouth daily.    Marland Kitchen atorvastatin (LIPITOR) 40 MG tablet Take 1 tablet (40 mg total) by mouth daily at 6 PM.  . travoprost, benzalkonium, (TRAVATAN) 0.004 % ophthalmic solution Place 1 drop into both eyes at bedtime.    Marland Kitchen zolpidem (AMBIEN) 5 MG tablet Take 1 tablet (5 mg total) by mouth at bedtime as needed for sleep.  . [DISCONTINUED] meclizine (ANTIVERT) 32 MG tablet Take 1 tablet (32 mg total) by mouth daily as needed for dizziness.  :   Review of Systems:  Out of a complete 14 point review of systems, all are reviewed and negative with the exception of these symptoms as listed below:   Review of Systems  Constitutional: Positive for fatigue.  HENT: Positive for hearing loss.        Spinning sensation  Neurological: Positive for dizziness.       Memory loss  Psychiatric/Behavioral:       Decreased energy    Objective:  Neurologic Exam  Physical Exam Physical Examination:   Filed Vitals:   04/12/14 1122  BP: 151/85  Pulse: 87  Temp: 96.7 F (35.9 C)    General Examination: The patient is a very pleasant 80 y.o. male in no acute distress. He appears well-developed and well-nourished and well groomed. He is somewhat anxious appearing.  HEENT: Normocephalic, atraumatic, pupils are equal, round and reactive to light and accommodation. Funduscopic exam is normal with sharp disc margins noted. Extraocular tracking is good without limitation to gaze excursion or nystagmus noted. Normal smooth pursuit is noted. Hearing is impaired. He has bilateral hearing aids. Face is  symmetric with normal facial animation and normal facial sensation. Speech is clear with no dysarthria noted. There is no hypophonia. There is no lip, neck/head, jaw or voice tremor. Neck is supple with full range of passive and active motion. There are no carotid bruits on auscultation. Oropharynx exam reveals: moderate mouth dryness, adequate dental hygiene and mild airway crowding, due to larger tongue. Mallampati is class II. Tongue protrudes centrally and palate elevates symmetrically.   Chest: Clear to auscultation without wheezing, rhonchi or crackles noted.  Heart: S1+S2+0, regular and normal without murmurs, rubs or gallops noted.   Abdomen: Soft, non-tender and non-distended with normal bowel sounds appreciated on  auscultation.  Extremities: There is no pitting edema in the distal lower extremities bilaterally. Pedal pulses are intact.  Skin: Warm and dry without trophic changes noted. There are no varicose veins. He is wearing a compression bandage around both knees. He feels that this helps with support. He has recently had skin cancer surgery on his nose and has a healing scar.  Musculoskeletal: exam reveals no obvious joint deformities, tenderness or joint swelling or erythema.   Neurologically:  Mental status: The patient is awake, alert and oriented in all 4 spheres. His immediate and remote memory, attention, language skills and fund of knowledge are appropriate. There is no evidence of aphasia, agnosia, apraxia or anomia. Speech is clear with normal prosody and enunciation. Thought process is linear. Mood is normal and affect is normal.  Cranial nerves II - XII are as described above under HEENT exam. In addition: shoulder shrug is normal with equal shoulder height noted. Motor exam: Normal bulk, strength and tone is noted. There is no drift, tremor or rebound. Romberg is negative. Reflexes are 2+ throughout. Babinski: Toes are flexor bilaterally. Fine motor skills and  coordination: intact with normal finger taps, normal hand movements, normal rapid alternating patting, normal foot taps and normal foot agility.  Cerebellar testing: No dysmetria or intention tremor on finger to nose testing. Heel to shin is unremarkable bilaterally. There is no truncal or gait ataxia.  Sensory exam: intact to light touch, pinprick, vibration, temperature sense in the upper and lower extremities, with the exception of mild decrease in vibratory sense in the distal lower chest remedies. Gait, station and balance: He stands slowly. No veering to one side is noted. No leaning to one side is noted. Posture is age-appropriate and stance is slightly wide-based. He walks without his cane without significant difficulty. He turns slowly. He walks somewhat cautiously. He is slightly wide-based with his gait but has no ataxia and no tendency to lean to one side while walking. I did not have them do a tandem walk.   Assessment and Plan:   In summary, Timothy Suarez is a very pleasant 80 y.o.-year old male with an underlying medical history of skin cancers, paroxysmal atrial fibrillation, hypertension, depression, aortic valve insufficiency, insomnia, on Ambien 5 mg, hyperlipidemia, glaucoma, BPH, colon cancer, status post partial colectomy, coronary artery disease, and mitral valve prolapse, and memory loss, who reports a one year hx of dizziness. This is intermittent and can be with sitting, or standing, no vertigo, no N/V, no HAs. Upon examination, he has a fairly nonfocal exam. I talked to him about his head CT findings and explained to him that basal ganglia calcification are not an unusual finding and are typically not a sinister sign of any progressive neurological disease. Basal ganglia calcifications do not explain his findings of cautious gait. He does not have any focal weakness or numbness. He has had some chronic white matter changes which I explained to him as well. He may have  age-appropriate memory loss. I am not sure how to explain his dizziness. I suggested we could proceed with a more detailed brain scan, namely MRI brain without contrast and also proceed with an EEG.  I had a long chat with the patient and  his daughter  about my findings and his symptoms. We talked about maintaining a healthy lifestyle in general. I encouraged the patient to eat healthy, exercise daily and keep well hydrated, to keep a scheduled bedtime and wake time routine, to not skip any  meals and eat healthy snacks in between meals and to have protein with Suarez meal.  I would like for him to drink more water. I've asked him to continue using his cane. He would like to hold off on any other testing at this time and proceed with your recommendation for physical therapy. I agree that this would probably be helpful. He would like to discuss this with you and set up physical therapy at [facility]. I would be hyper to see him back as needed. I answered all their questions and the patient and his daughter were in agreement.    Thank you very much for allowing me to participate in the care of this nice patient. If I can be of any further assistance to you please do not hesitate to call me at [phone].  Sincerely,   Star Age, MD, PhD

## 2014-04-12 NOTE — Patient Instructions (Signed)
I am not sure what is causing your dizziness. I will let you discuss with Dr. Mariea Clonts doing physical therapy at [facility].  We may consider an EEG and brain MRI next.  I will see you back as needed. Try to limit your Ambien use and stay well hydrated. Do not skip meals.

## 2014-06-03 ENCOUNTER — Encounter: Payer: Self-pay | Admitting: *Deleted

## 2014-06-28 ENCOUNTER — Encounter: Payer: Self-pay | Admitting: Internal Medicine

## 2014-06-28 ENCOUNTER — Ambulatory Visit (INDEPENDENT_AMBULATORY_CARE_PROVIDER_SITE_OTHER): Payer: Medicare Other | Admitting: Internal Medicine

## 2014-06-28 VITALS — BP 120/72 | HR 66 | Temp 98.0°F | Resp 18 | Ht 69.0 in | Wt 216.4 lb

## 2014-06-28 DIAGNOSIS — Z23 Encounter for immunization: Secondary | ICD-10-CM

## 2014-06-28 DIAGNOSIS — R42 Dizziness and giddiness: Secondary | ICD-10-CM

## 2014-06-28 DIAGNOSIS — G3184 Mild cognitive impairment, so stated: Secondary | ICD-10-CM

## 2014-06-28 DIAGNOSIS — G4700 Insomnia, unspecified: Secondary | ICD-10-CM

## 2014-06-28 DIAGNOSIS — I2510 Atherosclerotic heart disease of native coronary artery without angina pectoris: Secondary | ICD-10-CM

## 2014-06-28 MED ORDER — ZOLPIDEM TARTRATE 5 MG PO TABS: 5.0000 mg | ORAL_TABLET | Freq: Every evening | ORAL | Status: DC | PRN

## 2014-06-28 MED ORDER — ATORVASTATIN CALCIUM 40 MG PO TABS: 40.0000 mg | ORAL_TABLET | Freq: Every day | ORAL | Status: DC

## 2014-06-28 MED ORDER — VITAMIN D 50 MCG (2000 UT) PO CAPS: 1.0000 | ORAL_CAPSULE | Freq: Every day | ORAL | Status: DC

## 2014-06-28 MED ORDER — AMLODIPINE BESYLATE 5 MG PO TABS: 5.0000 mg | ORAL_TABLET | Freq: Every day | ORAL | Status: DC

## 2014-06-28 NOTE — Progress Notes (Signed)
Patient ID: Timothy Suarez, male   DOB: 1925-05-29, 80 y.o.   MRN: [id number]   Location:  [facility]  Code Status: has living will   Allergies  Allergen Reactions  . Citalopram Nausea Only  . Trazodone And Nefazodone     Dry mouth    Chief Complaint  Patient presents with  . Medical Management of Chronic Issues   HPI: Patient is a 80 y.o.  seen in the office today for medical mgt of chronic diseases.  He is doing a lot better.  Has been doing therapy with [name] and has gotten along very well with her.  Two days ago was last session.  He overdid it a little.  Says he is dizzy today, but it will pass by tomorrow.  Generally doesn't have the dizziness anymore.    Legs get plum tired when he is dizzy--thinks his weak legs actually made him dizzy now.    Aside from that, doing fairly well.  Is drinking more water.    We discussed benefits of vitamin D 2000 units daily for balance and for bone strength.  Has had zostavax, got flu shot today, has had both pneumonia vaccines.    Sometimes has to take something to help his bowels move.  Goes daily except once in a while.  If has to get to 3rd day, takes something.  No hematochezia or melena.  Has h/o colon cancer.  Last cscope was 2011.  Is on 5 year f/us.    Some urgency to urinate.  No incontinence.  2x nocturia.  Didn't ever have to until recently.  Has BPH that didn't show up until he saw urologist.    Review of Systems:  Review of Systems  Constitutional: Negative for fever, chills and malaise/fatigue.       At knees, he says  HENT: Negative for congestion.   Eyes: Negative for blurred vision.  Respiratory: Negative for shortness of breath.   Cardiovascular: Positive for leg swelling. Negative for chest pain.       Mild, on norvasc  Gastrointestinal: Negative for abdominal pain.  Genitourinary: Positive for urgency and frequency. Negative for dysuria.       Nocturia     Musculoskeletal: Negative for myalgias and falls.  Skin: Negative for rash.  Neurological: Positive for dizziness and weakness. Negative for focal weakness, loss of consciousness and headaches.  Endo/Heme/Allergies: Bruises/bleeds easily.  Psychiatric/Behavioral: Positive for memory loss. Negative for depression. The patient has insomnia.      Past Medical History  Diagnosis Date  . MVP (mitral valve prolapse)     S/P Rt mini thoractomy for Mitral Valve repair  . Paroxysmal atrial fibrillation     S/P Maze procedure  . HTN (hypertension)   . Depression   . Hernia   . Aortic valve insufficiency, acquired   . Dyslipidemia   . Glaucoma   . BPH (benign prostatic hyperplasia)   . Colon cancer     colon s/p partial colectomy  . Coronary artery disease     50-70% LAD    Past Surgical History  Procedure Laterality Date  . Flexhd patch repair of chest wall hernia.  01/28/11    [name]  . Colectomy  2004    Dr [name]  . Tympanoplasty    . Mitral valve repair  12/27/2008    complex valvuloplasty with 10mm Memo 3D annuloplasty ring via right minithoracotomy  . Maze  12/27/2008    left  side lesion set  . Mitral valve repair      minimally invasive  . Tee without cardioversion N/A 01/03/2013    Procedure: TRANSESOPHAGEAL ECHOCARDIOGRAM (TEE);  Surgeon: Sueanne Margarita, MD;  Location: [place];  Service: Cardiovascular;  Laterality: N/A;  . Total knee arthroplasty Right 2003    Social History:   reports that he has never smoked. He has never used smokeless tobacco. He reports that he does not drink alcohol or use illicit drugs.  Family History  Problem Relation Age of Onset  . Cancer Mother     lymphoma  . Cancer Father     pancreatic    Medications: Patient's Medications  New Prescriptions   No medications on file  Previous Medications   AMLODIPINE (NORVASC) 5 MG TABLET    Take 5 mg by mouth daily.     ATORVASTATIN (LIPITOR) 40 MG TABLET    Take 1 tablet (40 mg total)  by mouth daily at 6 PM.   FLUZONE HIGH-DOSE 0.5 ML SUSY       LATANOPROST (XALATAN) 0.005 % OPHTHALMIC SOLUTION       PREVNAR 13 SUSP INJECTION       TRAVOPROST, BENZALKONIUM, (TRAVATAN) 0.004 % OPHTHALMIC SOLUTION    Place 1 drop into both eyes at bedtime.     ZOLPIDEM (AMBIEN) 5 MG TABLET    Take 1 tablet (5 mg total) by mouth at bedtime as needed for sleep.  Modified Medications   No medications on file  Discontinued Medications   No medications on file     Physical Exam: Filed Vitals:   06/28/14 1121  BP: 120/72  Pulse: 66  Temp: 98 F (36.7 C)  TempSrc: Oral  Resp: 18  Height: 5\' 9"  (1.753 m)  Weight: 216 lb 6.4 oz (98.158 kg)  SpO2: 98%  Physical Exam  Constitutional: He is oriented to person, place, and time. He appears well-developed and well-nourished. No distress.  Cardiovascular: Normal rate, regular rhythm, normal heart sounds and intact distal pulses.   Pulmonary/Chest: Effort normal and breath sounds normal. No respiratory distress.  Abdominal: Soft. Bowel sounds are normal. He exhibits no distension and no mass. There is no tenderness.  Musculoskeletal: Normal range of motion. He exhibits no edema or tenderness.  Has arthritis of thumb joint with deformity present bilaterally (says from hitting golf balls)  Neurological: He is alert and oriented to person, place, and time.  Skin: Skin is warm and dry.  Psychiatric: He has a normal mood and affect.    Labs reviewed: Basic Metabolic Panel:  Recent Labs  04/02/14 1624  NA 142  K 4.1  CL 103  CO2 21  GLUCOSE 101*  BUN 15  CREATININE 1.08  CALCIUM 9.1  TSH 2.050   Liver Function Tests:  Recent Labs  04/02/14 1624  AST 13  ALT 12  ALKPHOS 107  BILITOT 0.4  PROT 6.6   No results for input(s): LIPASE, AMYLASE in the last 8760 hours. No results for input(s): AMMONIA in the last 8760 hours. CBC:  Recent Labs  04/02/14 1624  WBC 10.3  NEUTROABS 6.6  HGB 15.1  HCT 44.2  MCV 95  PLT 208    Lipid Panel:  Recent Labs  12/19/13  CHOL 130  HDL 42  LDLCALC 46  TRIG 209*   Lab Results  Component Value Date   HGBA1C  2020/07/909    5.3 (NOTE) The ADA recommends the following therapeutic goal for glycemic control related to Hgb A1c  measurement: Goal of therapy: <6.5 Hgb A1c  Reference: American Diabetes Association: Clinical Practice Recommendations 2010, Diabetes Care, 2010, 33: (Suppl  1).    Assessment/Plan 1. Dizziness and giddiness -has improved after vestibular therapy -doing so much better that he is forgetting his cane  2. Insomnia -remains a problem sometimes, uses as needed not nightly - zolpidem (AMBIEN) 5 MG tablet; Take 1 tablet (5 mg total) by mouth at bedtime as needed for sleep.  Dispense: 30 tablet; Refill: 3  3. Coronary artery disease involving native coronary artery of native heart without angina pectoris -bp and lipids have been at goal -cont norvasc and lipitor, not on anticoagulation  4. Mild cognitive impairment -scored 25/30 last visit, has some aphasia difficulty, will continue to monitor, remains fully functional  5. Need for prophylactic vaccination and inoculation against influenza -flu shot given today  Labs/tests ordered:  Will check labs at next appt Next appt:  3 mos  Millan Legan L. Bryanna Yim, D.O. [facility] [address], [place] [address] Cell Phone (Mon-Fri 8am-5pm):  [phone] On Call:  [phone] & follow prompts after 5pm & weekends Office Phone:  [phone] Office Fax:  [phone]

## 2014-08-23 ENCOUNTER — Encounter: Payer: Self-pay | Admitting: Internal Medicine

## 2014-08-23 ENCOUNTER — Ambulatory Visit (INDEPENDENT_AMBULATORY_CARE_PROVIDER_SITE_OTHER): Payer: Medicare Other | Admitting: Internal Medicine

## 2014-08-23 VITALS — BP 136/82 | HR 79 | Temp 97.7°F | Resp 10 | Wt 215.0 lb

## 2014-08-23 DIAGNOSIS — R490 Dysphonia: Secondary | ICD-10-CM

## 2014-08-23 DIAGNOSIS — R42 Dizziness and giddiness: Secondary | ICD-10-CM

## 2014-08-23 NOTE — Patient Instructions (Addendum)
Hoarseness Hoarseness is produced from a variety of causes. It is important to find the cause so it can be treated. In the absence of a cold or upper respiratory illness, any hoarseness lasting more than 2 weeks should be looked at by a specialist. This is especially important if you have a history of smoking or alcohol use. It is also important to keep in mind that as you grow older, your voice will naturally get weaker, making it easier for you to become hoarse from straining your vocal cords.  CAUSES  Any illness that affects your vocal cords can result in a hoarse voice. Examples of conditions that can affect the vocal cords are listed as follows:   Allergies.  Colds.  Sinusitis.  Gastroesophageal reflux disease.  Croup.  Injury.  Nodules.  Exposure to smoke or toxic fumes or gases.  Congenital and genetic defects.  Paralysis of the vocal cords.  Infections.  Advanced age. DIAGNOSIS  In order to diagnose the cause of your hoarseness, your caregiver will examine your throat using an instrument that uses a tube with a small lighted camera (laryngoscope). It allows your caregiver to look into the mouth and down the throat. TREATMENT  For most cases, treatment will focus on the specific cause of the hoarseness. Depending on the cause, hoarseness can be a temporary condition (acute) or it can be long lasting (chronic). Most cases of hoarseness clear up without complications. Your caregiver will explain to you if this is not likely to happen. SEEK IMMEDIATE MEDICAL CARE IF:   You have increasing hoarseness or loss of voice.  You have shortness of breath.  You are coughing up blood.  There is pain in your neck or throat. Document Released: 07/10/2005 Document Revised: 10/19/2011 Document Reviewed: 10/02/2010 [facility] Patient Information 2015 [place], [place]. This information is not intended to replace advice given to you by your health care provider. Make sure you discuss any  questions you have with your health care provider.  I suspect your hoarseness is due to postnasal drip or acid reflux.   Try using a humidifier or sitting in the shower for 10-15 minutes breathing in the humidity.  Do this and gargle with warm salt water over the weekend.  If your hoarseness persists, call me on Monday, and we will try a reflux medication.

## 2014-08-23 NOTE — Progress Notes (Signed)
Patient ID: Timothy Suarez, male   DOB: 1925/05/21, 81 y.o.   MRN: [id number]   Location:  [facility]   Allergies  Allergen Reactions  . Citalopram Nausea Only  . Trazodone And Nefazodone     Dry mouth    Chief Complaint  Patient presents with  . Acute Visit    Voice in and out x 1 week     HPI: Patient is a 81 y.o. white male seen in the office today for acute visit due to hoarseness.    Has always had a little rough voice.  No sore throat or difficulty swallowing.  No heartburn, sinus congestion, season allergies, but hoarseness worsened over a week.  No on steroids.  Does drink daily alcohol which is unchanged.  Has two friends who died from esophageal cancer.  Drinks plenty of fluids.    Seeing a therapist also named Theophilus Walz who is working with him on his dizziness and balance problems.  He continues to struggle with generalized weakness off and on.    Review of Systems:  Review of Systems  Constitutional: Positive for malaise/fatigue. Negative for fever and chills.  HENT: Positive for hearing loss. Negative for congestion, ear discharge, ear pain, nosebleeds, sore throat and tinnitus.        Hoarseness  Respiratory: Negative for stridor.   Musculoskeletal: Negative for neck pain.  Neurological: Positive for dizziness and weakness. Negative for headaches.    Past Medical History  Diagnosis Date  . MVP (mitral valve prolapse)     S/P Rt mini thoractomy for Mitral Valve repair  . Paroxysmal atrial fibrillation     S/P Maze procedure  . HTN (hypertension)   . Depression   . Hernia   . Aortic valve insufficiency, acquired   . Dyslipidemia   . Glaucoma   . BPH (benign prostatic hyperplasia)   . Colon cancer     colon s/p partial colectomy  . Coronary artery disease     50-70% LAD    Past Surgical History  Procedure Laterality Date  . Flexhd patch repair of chest wall hernia.  01/28/11    [name]  . Colectomy  2004    Dr  [name]  . Tympanoplasty    . Mitral valve repair  12/27/2008    complex valvuloplasty with 64mm Memo 3D annuloplasty ring via right minithoracotomy  . Maze  12/27/2008    left side lesion set  . Mitral valve repair      minimally invasive  . Tee without cardioversion N/A 01/03/2013    Procedure: TRANSESOPHAGEAL ECHOCARDIOGRAM (TEE);  Surgeon: Sueanne Margarita, MD;  Location: [place];  Service: Cardiovascular;  Laterality: N/A;  . Total knee arthroplasty Right 2003    Social History:   reports that he has never smoked. He has never used smokeless tobacco. He reports that he does not drink alcohol or use illicit drugs.  Family History  Problem Relation Age of Onset  . Cancer Mother     lymphoma  . Cancer Father     pancreatic    Medications: Patient's Medications  New Prescriptions   No medications on file  Previous Medications   AMLODIPINE (NORVASC) 5 MG TABLET    Take 1 tablet (5 mg total) by mouth daily. For blood pressure   ATORVASTATIN (LIPITOR) 40 MG TABLET    Take 1 tablet (40 mg total) by mouth daily at 6 PM. For cholesterol   CHOLECALCIFEROL (VITAMIN D) 2000 UNITS  CAPS    Take 1 capsule (2,000 Units total) by mouth daily.   LATANOPROST (XALATAN) 0.005 % OPHTHALMIC SOLUTION       TRAVOPROST, BENZALKONIUM, (TRAVATAN) 0.004 % OPHTHALMIC SOLUTION    Place 1 drop into both eyes at bedtime.     ZOLPIDEM (AMBIEN) 5 MG TABLET    Take 1 tablet (5 mg total) by mouth at bedtime as needed for sleep.  Modified Medications   No medications on file  Discontinued Medications   FLUZONE HIGH-DOSE 0.5 ML SUSY       PREVNAR 13 SUSP INJECTION        Physical Exam: Filed Vitals:   08/23/14 1435  BP: 136/82  Pulse: 79  Temp: 97.7 F (36.5 C)  TempSrc: Oral  Resp: 10  Weight: 215 lb (97.523 kg)  SpO2: 95%  Physical Exam  Constitutional: He appears well-developed and well-nourished. No distress.  HENT:  Head: Normocephalic and atraumatic.  Nose: Nose normal.  Mouth/Throat:  Oropharyngeal exudate present.  Postnasal drip  Eyes: Conjunctivae and EOM are normal. Pupils are equal, round, and reactive to light.  Neck: Neck supple. No tracheal deviation present. No thyromegaly present.  Pulmonary/Chest: Effort normal and breath sounds normal. No stridor. No respiratory distress. He has no wheezes.  Musculoskeletal: He exhibits no tenderness.  Ambulates with cane  Lymphadenopathy:    He has no cervical adenopathy.  Neurological: Coordination normal.  Skin: Skin is warm and dry.     Labs reviewed: Basic Metabolic Panel:  Recent Labs  04/02/14 1624  NA 142  K 4.1  CL 103  CO2 21  GLUCOSE 101*  BUN 15  CREATININE 1.08  CALCIUM 9.1  TSH 2.050   Liver Function Tests:  Recent Labs  04/02/14 1624  AST 13  ALT 12  ALKPHOS 107  BILITOT 0.4  PROT 6.6   No results for input(s): LIPASE, AMYLASE in the last 8760 hours. No results for input(s): AMMONIA in the last 8760 hours. CBC:  Recent Labs  04/02/14 1624  WBC 10.3  NEUTROABS 6.6  HGB 15.1  HCT 44.2  MCV 95  PLT 208   Lipid Panel:  Recent Labs  12/19/13  CHOL 130  HDL 42  LDLCALC 46  TRIG 209*   Lab Results  Component Value Date   HGBA1C  12/29/2008    5.3 (NOTE) The ADA recommends the following therapeutic goal for glycemic control related to Hgb A1c measurement: Goal of therapy: <6.5 Hgb A1c  Reference: American Diabetes Association: Clinical Practice Recommendations 2010, Diabetes Care, 2010, 33: (Suppl  1).     Assessment/Plan 1. Hoarseness of voice -has had mildly, but much worse in the past week -does have evidence of postnasal drip, but denies noting this, allergies, congestion -recommended salt water gargles and warm humidity to help with this -if it does not improve, will plan to treat with omeprazole next -if omeprazole is ineffective after one week, I will refer him to ENT -instructions were provided to him  2. Dizziness and giddiness -continues off and on, and  will continue to work with therapy  Next appt:  Prn and keep regular visit  Nadina Fomby L. Lynn Sissel, D.O. [facility] [address], [place] [address] Cell Phone (Mon-Fri 8am-5pm):  [phone] On Call:  [phone] & follow prompts after 5pm & weekends Office Phone:  [phone] Office Fax:  [phone]

## 2014-09-27 ENCOUNTER — Ambulatory Visit (INDEPENDENT_AMBULATORY_CARE_PROVIDER_SITE_OTHER): Payer: Medicare Other | Admitting: Internal Medicine

## 2014-09-27 ENCOUNTER — Encounter: Payer: Self-pay | Admitting: Internal Medicine

## 2014-09-27 VITALS — BP 138/82 | HR 78 | Temp 96.4°F | Resp 20 | Ht 69.0 in | Wt 213.0 lb

## 2014-09-27 DIAGNOSIS — R3915 Urgency of urination: Secondary | ICD-10-CM

## 2014-09-27 DIAGNOSIS — R42 Dizziness and giddiness: Secondary | ICD-10-CM

## 2014-09-27 DIAGNOSIS — I2510 Atherosclerotic heart disease of native coronary artery without angina pectoris: Secondary | ICD-10-CM

## 2014-09-27 DIAGNOSIS — N401 Enlarged prostate with lower urinary tract symptoms: Secondary | ICD-10-CM

## 2014-09-27 DIAGNOSIS — R490 Dysphonia: Secondary | ICD-10-CM

## 2014-09-27 DIAGNOSIS — G3184 Mild cognitive impairment, so stated: Secondary | ICD-10-CM

## 2014-09-27 MED ORDER — OMEPRAZOLE 20 MG PO TBEC: 20.0000 mg | DELAYED_RELEASE_TABLET | Freq: Every day | ORAL | Status: DC

## 2014-09-27 NOTE — Progress Notes (Signed)
Patient ID: Timothy Suarez, male   DOB: 1925-02-13, 81 y.o.   MRN: [id number]    Location:  [facility]     Allergies  Allergen Reactions  . Citalopram Nausea Only  . Trazodone And Nefazodone     Dry mouth    Chief Complaint  Patient presents with  . Medical Management of Chronic Issues     3 month follow up, patient is fasting today, if any labs due     HPI: Patient is a 81 y.o. white male seen in the office today for management of chronic medical issues.   Hoarseness has not resolved. He did use humidifier and did not note any changes so he stopped. Has not gotten worse, but has not gotten better. Denies throat pain, sinus drainage. Has a dry cough, more to clear his throat. Denies trouble swallowing or globus sensation. Denies heartburn, nausea, vomiting.   Has dizziness in spells. Physical therapy is helping. Muscles get weak when he gets dizzy, but when he isn't dizzy he feels like he could run a race.   Blood pressure is stable. He checks it also at grocery store and it is the same as today.   Bowels are normal.   Some urinary urgency and frequency, and nocturia. This has gotten worse in the past 2-3 months. Avoiding long trips due to fear of incontinence. Denies dysuria, hematuria, or discharge. He has not seen a proctologist.  Mood varies. When has dizziness he gets lost sometimes. Starts to do things and forgets what he's doing. Has to pay extra attention when he's driving. These episodes bring his mood down.    Review of Systems:  Review of Systems  Constitutional: Negative for weight loss and malaise/fatigue.  HENT: Positive for hearing loss.   Gastrointestinal: Negative for heartburn, nausea, vomiting, diarrhea and constipation.  Genitourinary: Positive for urgency and frequency.  Musculoskeletal: Negative for myalgias and joint pain.       Left knee weakness that is not new, hx R knee replacement  Neurological:  Positive for dizziness.  Psychiatric/Behavioral: Positive for depression. Negative for suicidal ideas and substance abuse. The patient is not nervous/anxious.     Past Medical History  Diagnosis Date  . MVP (mitral valve prolapse)     S/P Rt mini thoractomy for Mitral Valve repair  . Paroxysmal atrial fibrillation     S/P Maze procedure  . HTN (hypertension)   . Depression   . Hernia   . Aortic valve insufficiency, acquired   . Dyslipidemia   . Glaucoma   . BPH (benign prostatic hyperplasia)   . Colon cancer     colon s/p partial colectomy  . Coronary artery disease     50-70% LAD    Past Surgical History  Procedure Laterality Date  . Flexhd patch repair of chest wall hernia.  01/28/11    [name]  . Colectomy  2004    Dr [name]  . Tympanoplasty    . Mitral valve repair  12/27/2008    complex valvuloplasty with 26mm Memo 3D annuloplasty ring via right minithoracotomy  . Maze  12/27/2008    left side lesion set  . Mitral valve repair      minimally invasive  . Tee without cardioversion N/A 01/03/2013    Procedure: TRANSESOPHAGEAL ECHOCARDIOGRAM (TEE);  Surgeon: Sueanne Margarita, MD;  Location: [place];  Service: Cardiovascular;  Laterality: N/A;  . Total knee arthroplasty Right 2003    Social History:  reports that he has never smoked. He has never used smokeless tobacco. He reports that he drinks alcohol. He reports that he does not use illicit drugs.  Family History  Problem Relation Age of Onset  . Cancer Mother     lymphoma  . Cancer Father     pancreatic    Medications: Patient's Medications  New Prescriptions   No medications on file  Previous Medications   AMLODIPINE (NORVASC) 5 MG TABLET    Take 1 tablet (5 mg total) by mouth daily. For blood pressure   ATORVASTATIN (LIPITOR) 40 MG TABLET    Take 1 tablet (40 mg total) by mouth daily at 6 PM. For cholesterol   CHOLECALCIFEROL (VITAMIN D) 2000 UNITS CAPS    Take 1 capsule (2,000 Units total) by mouth  daily.   TRAVOPROST, BENZALKONIUM, (TRAVATAN) 0.004 % OPHTHALMIC SOLUTION    Place 1 drop into both eyes at bedtime.     ZOLPIDEM (AMBIEN) 5 MG TABLET    Take 1 tablet (5 mg total) by mouth at bedtime as needed for sleep.  Modified Medications   No medications on file  Discontinued Medications   LATANOPROST (XALATAN) 0.005 % OPHTHALMIC SOLUTION         Physical Exam: Filed Vitals:   09/27/14 1103  BP: 138/82  Pulse: 78  Temp: 96.4 F (35.8 C)  TempSrc: Oral  Resp: 20  Height: 5\' 9"  (1.753 m)  Weight: 213 lb (96.616 kg)  SpO2: 97%   Physical Exam  Constitutional: He appears well-developed and well-nourished.  Cardiovascular: Normal rate, regular rhythm and normal heart sounds.  Exam reveals no friction rub.   No murmur heard. Pulmonary/Chest: Effort normal and breath sounds normal.  Abdominal: Soft. Bowel sounds are normal. He exhibits no distension. There is no tenderness.  Musculoskeletal: Normal range of motion. He exhibits no tenderness.  Vitals reviewed.   Labs reviewed: Basic Metabolic Panel:  Recent Labs  04/02/14 1624  NA 142  K 4.1  CL 103  CO2 21  GLUCOSE 101*  BUN 15  CREATININE 1.08  CALCIUM 9.1  TSH 2.050   Liver Function Tests:  Recent Labs  04/02/14 1624  AST 13  ALT 12  ALKPHOS 107  BILITOT 0.4  PROT 6.6   No results for input(s): LIPASE, AMYLASE in the last 8760 hours. No results for input(s): AMMONIA in the last 8760 hours. CBC:  Recent Labs  04/02/14 1624  WBC 10.3  NEUTROABS 6.6  HGB 15.1  HCT 44.2  MCV 95  PLT 208   Lipid Panel:  Recent Labs  12/19/13  CHOL 130  HDL 42  LDLCALC 46  TRIG 209*   Lab Results  Component Value Date   HGBA1C  2020-07-1409    5.3 (NOTE) The ADA recommends the following therapeutic goal for glycemic control related to Hgb A1c measurement: Goal of therapy: <6.5 Hgb A1c  Reference: American Diabetes Association: Clinical Practice Recommendations 2010, Diabetes Care, 2010, 33: (Suppl  1).      Assessment/Plan 1. Coronary artery disease involving native coronary artery of native heart without angina pectoris -stable, no changes, cont lipitor and norvasc, is not on any anticoagulation whatsoever (falls frequently, but probably should be on something)  2. Hoarseness of voice -did not improve with salt water gargles and conservative measures for postnasal drip and reflux so will try on PPI -if this is not effective by 2 wks, he is to call me and I will refer him to ENT - Omeprazole 20  MG TBEC; Take 1 tablet (20 mg total) by mouth daily before breakfast.  Dispense: 30 each; Refill: 3  3. Dizziness and giddiness -stable, cause never clear--seems very unusual -I previously stopped his ambien, but somehow he's back on it  4. Mild cognitive impairment -needs MMSE reassessed at routine visit and really needs family to come along to visits so someone will remember the advice he's given (I did give written instructions on his avs)  5. Benign prostatic hyperplasia (BPH) with urinary urgency -not on meds (is already dizzy), but will check PSA -symptoms are minor - PSA  Labs/tests ordered:  PSA Next appt:  6 wks, but call in 2 if hoarseness is not better  Alyda Megna L. Kemoni Ortega, D.O. [facility] [address], [place] [address] Cell Phone (Mon-Fri 8am-5pm):  [phone] On Call:  [phone] & follow prompts after 5pm & weekends Office Phone:  [phone] Office Fax:  [phone]

## 2014-09-28 ENCOUNTER — Telehealth: Payer: Self-pay | Admitting: *Deleted

## 2014-09-28 DIAGNOSIS — N401 Enlarged prostate with lower urinary tract symptoms: Principal | ICD-10-CM

## 2014-09-28 DIAGNOSIS — N138 Other obstructive and reflux uropathy: Secondary | ICD-10-CM

## 2014-09-28 LAB — PSA: PSA: 21.7 ng/mL — ABNORMAL HIGH (ref 0.0–4.0)

## 2014-09-28 NOTE — Telephone Encounter (Signed)
-----   Message from Gayland Curry, DO sent at 09/28/2014  3:08 PM EST ----- PSA is very high.  I would like to refer him to urology to evaluate for prostate cancer.  I recommend that he bring a family member with him to the urology consult due to his memory problems and the importance of the information they will be providing him.

## 2014-09-28 NOTE — Telephone Encounter (Signed)
Patient Notified and understood. Patient stated that he would have someone go with him and explained to him that I would put in referral to urologist and someone would be giving him a call.

## 2014-10-08 ENCOUNTER — Telehealth: Payer: Self-pay | Admitting: *Deleted

## 2014-10-08 NOTE — Telephone Encounter (Signed)
Patient called and stated that you wanted him to call you regarding the new medication you put him on. He stated that the medication is doing fine and he is going to keep taking it. Just FYI.

## 2014-11-09 ENCOUNTER — Encounter: Payer: Self-pay | Admitting: Internal Medicine

## 2014-11-09 ENCOUNTER — Ambulatory Visit (INDEPENDENT_AMBULATORY_CARE_PROVIDER_SITE_OTHER): Payer: Medicare Other | Admitting: Internal Medicine

## 2014-11-09 VITALS — BP 130/78 | HR 77 | Temp 98.0°F | Resp 20 | Ht 72.0 in | Wt 213.0 lb

## 2014-11-09 DIAGNOSIS — N401 Enlarged prostate with lower urinary tract symptoms: Secondary | ICD-10-CM | POA: Diagnosis not present

## 2014-11-09 DIAGNOSIS — I482 Chronic atrial fibrillation, unspecified: Secondary | ICD-10-CM

## 2014-11-09 DIAGNOSIS — R972 Elevated prostate specific antigen [PSA]: Secondary | ICD-10-CM | POA: Diagnosis not present

## 2014-11-09 DIAGNOSIS — I2510 Atherosclerotic heart disease of native coronary artery without angina pectoris: Secondary | ICD-10-CM | POA: Diagnosis not present

## 2014-11-09 DIAGNOSIS — G3184 Mild cognitive impairment, so stated: Secondary | ICD-10-CM | POA: Diagnosis not present

## 2014-11-09 DIAGNOSIS — R490 Dysphonia: Secondary | ICD-10-CM | POA: Diagnosis not present

## 2014-11-09 DIAGNOSIS — N138 Other obstructive and reflux uropathy: Secondary | ICD-10-CM

## 2014-11-09 DIAGNOSIS — R42 Dizziness and giddiness: Secondary | ICD-10-CM

## 2014-11-09 MED ORDER — ASPIRIN EC 81 MG PO TBEC: 81.0000 mg | DELAYED_RELEASE_TABLET | Freq: Every day | ORAL | Status: DC

## 2014-11-09 NOTE — Progress Notes (Signed)
Patient ID: Timothy Suarez, male   DOB: 1925-01-15, 81 y.o.   MRN: [id number]   Location:  [facility]  Code Status: DNR  Advanced Directive information Does patient have an advance directive?: Yes, Type of Advance Directive: Living will;Healthcare Power of Attorney, Does patient want to make changes to advanced directive?: No - Patient declined   Allergies  Allergen Reactions  . Citalopram Nausea Only  . Trazodone And Nefazodone     Dry mouth    Chief Complaint  Patient presents with  . Medical Management of Chronic Issues    6 week follow-up, Discuss labs (copy printed) MMSE    HPI: Patient is a 81 y.o. white male seen in the office today for 6 wk f/u on memory and his urinary complaints.    Reflux medicine helped a little at first with his hoarseness, but then returned.  Has not improved with allergy meds either.  Discussed other possibilities and plan for ENT referral now.  He was seen by urology due to PSA of 21.7, but told them his urinary frequency was not bothersome to him.  Continues to go when he thinks he's done.  Has more critical urge than it used to be.  He does have a f/u for biopsy.  7-8 years ago, had elevated PSA then.  Doesn't remember who.  Says he was finally told that his psa was not that high.  Says he held off taking bus to [place] due to urinary urgency.    MMSE - Mini Mental State Exam 11/09/2014 04/02/2014  Orientation to time 5 4  Orientation to Place 5 5  Registration 3 3  Attention/ Calculation 5 3  Recall 1 1  Language- name 2 objects 2 2  Language- repeat 1 1  Language- follow 3 step command 3 3  Language- read & follow direction 1 1  Write a sentence 1 1  Copy design 1 1  Total score 28 25   Passed clock (but had to fix his error).  Talked about his short term memory loss a little bit--doesn't remember things visit to visit well.  Can talk about historical things well.  I have tried to get him off  ambien w/o success--does not use every night.  Got lost finding urology office.    Continues with same good and bad days with the dizziness.  Today, feels dizzy and doesn't go out then.  Yesterday he felt great.  Chronic afib--has not been even taking asa--advised he should today.    Reminded to take his vitamin D for balance.    Review of Systems:  Review of Systems  Constitutional: Negative for fever and chills.  HENT: Positive for hearing loss.        Hoarseness  Eyes: Negative for blurred vision.  Respiratory: Negative for shortness of breath.   Cardiovascular: Negative for chest pain, palpitations and leg swelling.  Gastrointestinal: Negative for abdominal pain.  Genitourinary: Negative for dysuria.  Musculoskeletal: Negative for falls.  Neurological: Positive for dizziness.  Psychiatric/Behavioral: Positive for memory loss. Negative for depression. The patient is nervous/anxious.      Past Medical History  Diagnosis Date  . MVP (mitral valve prolapse)     S/P Rt mini thoractomy for Mitral Valve repair  . Paroxysmal atrial fibrillation     S/P Maze procedure  . HTN (hypertension)   . Depression   . Hernia   . Aortic valve insufficiency, acquired   . Dyslipidemia   .  Glaucoma   . BPH (benign prostatic hyperplasia)   . Colon cancer     colon s/p partial colectomy  . Coronary artery disease     50-70% LAD    Past Surgical History  Procedure Laterality Date  . Flexhd patch repair of chest wall hernia.  01/28/11    [name]  . Colectomy  2004    Dr [name]  . Tympanoplasty    . Mitral valve repair  12/27/2008    complex valvuloplasty with 58mm Memo 3D annuloplasty ring via right minithoracotomy  . Maze  12/27/2008    left side lesion set  . Mitral valve repair      minimally invasive  . Tee without cardioversion N/A 01/03/2013    Procedure: TRANSESOPHAGEAL ECHOCARDIOGRAM (TEE);  Surgeon: Sueanne Margarita, MD;  Location: [place];  Service: Cardiovascular;   Laterality: N/A;  . Total knee arthroplasty Right 2003    Social History:   reports that he has never smoked. He has never used smokeless tobacco. He reports that he drinks alcohol. He reports that he does not use illicit drugs.  Family History  Problem Relation Age of Onset  . Cancer Mother     lymphoma  . Cancer Father     pancreatic    Medications: Patient's Medications  New Prescriptions   No medications on file  Previous Medications   AMLODIPINE (NORVASC) 5 MG TABLET    Take 1 tablet (5 mg total) by mouth daily. For blood pressure   ATORVASTATIN (LIPITOR) 40 MG TABLET    Take 1 tablet (40 mg total) by mouth daily at 6 PM. For cholesterol   CHOLECALCIFEROL (VITAMIN D) 2000 UNITS CAPS    Take 1 capsule (2,000 Units total) by mouth daily.   OMEPRAZOLE 20 MG TBEC    Take 1 tablet (20 mg total) by mouth daily before breakfast.   TRAVOPROST, BENZALKONIUM, (TRAVATAN) 0.004 % OPHTHALMIC SOLUTION    Place 1 drop into both eyes at bedtime.     ZOLPIDEM (AMBIEN) 5 MG TABLET    Take 1 tablet (5 mg total) by mouth at bedtime as needed for sleep.  Modified Medications   No medications on file  Discontinued Medications   No medications on file     Physical Exam: Filed Vitals:   11/09/14 1110  BP: 130/78  Pulse: 77  Temp: 98 F (36.7 C)  TempSrc: Oral  Resp: 20  Height: 6' (1.829 m)  Weight: 213 lb (96.616 kg)  SpO2: 95%  Physical Exam  Constitutional: He is oriented to person, place, and time. He appears well-developed and well-nourished. No distress.  HENT:  Head: Normocephalic and atraumatic.  Very HOH despite hearing aides  Cardiovascular:  irreg irreg  Pulmonary/Chest: Effort normal and breath sounds normal. No respiratory distress. He has no rales.  Abdominal: Soft. Bowel sounds are normal. He exhibits no distension and no mass. There is no tenderness.  Musculoskeletal: Normal range of motion. He exhibits no edema or tenderness.  Unsteady gait, walks with cane    Neurological: He is alert and oriented to person, place, and time.  Skin: Skin is warm and dry.  Psychiatric: He has a normal mood and affect.    Labs reviewed: Basic Metabolic Panel:  Recent Labs  04/02/14 1624  NA 142  K 4.1  CL 103  CO2 21  GLUCOSE 101*  BUN 15  CREATININE 1.08  CALCIUM 9.1  TSH 2.050   Liver Function Tests:  Recent Labs  04/02/14 1624  AST 13  ALT 12  ALKPHOS 107  BILITOT 0.4  PROT 6.6   No results for input(s): LIPASE, AMYLASE in the last 8760 hours. No results for input(s): AMMONIA in the last 8760 hours. CBC:  Recent Labs  04/02/14 1624  WBC 10.3  NEUTROABS 6.6  HGB 15.1  HCT 44.2  MCV 95  PLT 208   Lipid Panel:  Recent Labs  12/19/13  CHOL 130  HDL 42  LDLCALC 46  TRIG 209*   Lab Results  Component Value Date   HGBA1C  09-02-2008    5.3 (NOTE) The ADA recommends the following therapeutic goal for glycemic control related to Hgb A1c measurement: Goal of therapy: <6.5 Hgb A1c  Reference: American Diabetes Association: Clinical Practice Recommendations 2010, Diabetes Care, 2010, 33: (Suppl  1).    Assessment/Plan 1. Hoarseness of voice - did not respond to seasonal allergy meds (zyrtec), warm salt water gargles, or omeprazole therapy -due to persistence, will refer to ENT for visual assessment of his larynx/vocal cords--?polyp, mass, calcification or simply unresponsive reflux or PND - Ambulatory referral to ENT  2. BPH (benign prostatic hypertrophy) with urinary obstruction -keep f/u with urology -not on meds for this  3. Elevated PSA, greater than or equal to 20 ng/ml -for prostate biopsy coming up--await results/pathology  4. Coronary artery disease involving native coronary artery of native heart without angina pectoris -cont statin, encouraged him to take a baby asa at least--he is not very receptive to this idea (encouraged due to afib and doesn't want any other anticoagulants) -bp at goal  5. Dizziness and  giddiness -I still think this is due to p afib or cerebrovascular disease -did not improve when he did not take ambien -cont use of cane -went to therapy which was helpful  -cont vitamin D to help with bone and muscle strength/balance (also has not been faithful taking it)  6. Mild cognitive impairment -not currently on dementia meds--seems he's maintained his function and mmse 28/30 today with difficulty with clock but normal final product -will monitor at least annually  -clearly has short term memory loss -lives at [place]  7. Chronic atrial fibrillation -rate has been controlled when he has been here -encouraged him to at least take a baby asa---does not really want to despite stroke risk--seems he did not respond to his prior ablation - aspirin EC 81 MG tablet; Take 1 tablet (81 mg total) by mouth daily.  Dispense: 30 tablet; Refill: 3  Labs/tests ordered:   Orders Placed This Encounter  Procedures  . Ambulatory referral to ENT    Referral Priority:  Routine    Referral Type:  Consultation    Referral Reason:  Specialty Services Required    Requested Specialty:  Otolaryngology    Number of Visits Requested:  1    Next appt:  2 mos to f/u on hoarseness and prostate bx  Avabella Wailes L. Roshaun Pound, D.O. [facility] [address], [place] [address] Cell Phone (Mon-Fri 8am-5pm):  [phone] On Call:  [phone] & follow prompts after 5pm & weekends Office Phone:  [phone] Office Fax:  [phone]

## 2014-11-09 NOTE — Progress Notes (Signed)
Pass clock

## 2014-11-22 ENCOUNTER — Encounter: Payer: Self-pay | Admitting: Internal Medicine

## 2014-11-27 ENCOUNTER — Telehealth: Payer: Self-pay | Admitting: *Deleted

## 2014-11-27 NOTE — Telephone Encounter (Signed)
Dr. Mariea Clonts received the [facility] E[facility] Correspondence for patient from his visit for Hoarseness and they suggested a microlaryngoscopy biopsy but wants patient to have cardiac clearance first. Dr. Mariea Clonts wanted me to call patient and see if he had an appointment with cardiologist Dr. Fransico Him for cardiac clearance.  Called patient and he stated that the ENT Dr. Is in the process of setting this up for him. He will call his office if he hasn't heard anything from them by the end of the week.

## 2014-12-05 ENCOUNTER — Other Ambulatory Visit: Payer: Self-pay | Admitting: Internal Medicine

## 2014-12-13 ENCOUNTER — Other Ambulatory Visit (HOSPITAL_COMMUNITY): Payer: Self-pay | Admitting: Urology

## 2014-12-13 DIAGNOSIS — C61 Malignant neoplasm of prostate: Secondary | ICD-10-CM

## 2014-12-20 DIAGNOSIS — Z9889 Other specified postprocedural states: Secondary | ICD-10-CM | POA: Insufficient documentation

## 2014-12-20 DIAGNOSIS — Z8679 Personal history of other diseases of the circulatory system: Secondary | ICD-10-CM | POA: Insufficient documentation

## 2014-12-20 NOTE — Progress Notes (Signed)
Cardiology Office Note   Date:  12/21/2014   ID:  Timothy Suarez, DOB 1924/11/23, MRN [id number]  PCP:  Hollace Kinnier, DO  Cardiologist:  Dr. Fransico Him     Chief Complaint  Patient presents with  . Surgical Clearance  . Atrial Fibrillation  . Mitral Valve Prolapse    s/p Repair in 2010     History of Present Illness: Timothy Suarez is a 81 y.o. male with a hx of PAF and MVP with severe MR s/p R mini-thoracotomy for MV repair and MAZE procedure 12/2008, chest wall hernia at thoracotomy site s/p repair, HTN, non-obstructive CAD, colon CA s/p partial colectomy in 2004. Ejection fraction has been depressed in the past at 45%. Most recent transesophageal echocardiogram in 12/2012 demonstrated an EF of 50-55%. This also demonstrated moderate aortic insufficiency. Last seen by Dr. Radford Pax 12/2011.  The patient needs biopsy of his right vocal cord secondary to a history of hoarseness. This needs to be done under general anesthesia. The surgeon is Dr. Izora Gala.  He returns for surgical clearance.    He is here today with his daughter. The patient has had difficulty with dizziness for a long time now. He tells me he has seen several different specialists. He also notes dyspnea with exertion. This seems to have gotten worse over the last 6 months. He describes NYHA 2b-3 symptoms. He denies chest pain. His dizziness occurs at any time. However, he does note it with certain activities. It seems to come on with changes in head positioning at times as well. He feels weak in the legs with this. He denies spinning. He denies syncope or near-syncope. He denies orthopnea, PND or edema.   Studies/Reports Reviewed Today:  Carotid US 04/06/14 IMPRESSION: 1. Mild bilateral carotid atherosclerotic vascular disease. No hemodynamically significant stenosis. 2. Vertebrals are patent with antegrade flow.  TEE 01/03/13 - EF 50% to 55%. - Aortic valve: Moderate regurgitation directed eccentrically in  the LVOT and towards the mitral anterior leaflet. - Right atrium: No evidence of thrombus in the atrial cavity or appendage. - Tricuspid valve: No evidence of vegetation. - Pulmonic valve: No evidence of vegetation.  ETT-Myoview 12/2011 Diaphragmatic attenuation; no ischemia or scar, EF 43%  Echo 12/2008 (before surgery) 1. Left ventricle: Systolic function was normal. Wall motion wasnormal; there were no regional wall motion abnormalities. 2. Aortic valve: Mild to moderate regurgitation directed towards themitral anterior leaflet. 3. Mitral valve: Severe, holosystolic prolapse, involving the medialscallop of the posterior leaflet. Severe regurgitation directedeccentrically and toward the free wall. 4. Staged echo: Good LVEF. Post repair images demonstrated noresidual MR, no MS and no perivalvular leak.   Past Medical History  Diagnosis Date  . MVP (mitral valve prolapse)     S/P Rt mini thoractomy for Mitral Valve repair  . Paroxysmal atrial fibrillation     S/P Maze procedure  . HTN (hypertension)   . Depression   . Hernia   . Aortic valve insufficiency, acquired   . Dyslipidemia   . Glaucoma   . BPH (benign prostatic hyperplasia)   . Colon cancer     colon s/p partial colectomy  . Coronary artery disease     50-70% LAD  . Heart valve replaced   . A-fib   . Cerebral ischemia   . Sleep disturbance   . Mild cognitive impairment   . Thoracic aortic aneurysm   . Major depression   . Hyperlipidemia     Past Surgical History  Procedure  Laterality Date  . Flexhd patch repair of chest wall hernia.  01/28/11    [name]  . Colectomy  2004    Dr [name]  . Tympanoplasty    . Mitral valve repair  12/27/2008    complex valvuloplasty with 63mm Memo 3D annuloplasty ring via right minithoracotomy  . Maze  12/27/2008    left side lesion set  . Mitral valve repair      minimally invasive  . Tee without cardioversion N/A 01/03/2013    Procedure: TRANSESOPHAGEAL ECHOCARDIOGRAM  (TEE);  Surgeon: Sueanne Margarita, MD;  Location: [facility];  Service: Cardiovascular;  Laterality: N/A;  . Total knee arthroplasty Right 2003     Current Outpatient Prescriptions  Medication Sig Dispense Refill  . amLODipine (NORVASC) 5 MG tablet Take 1 tablet (5 mg total) by mouth daily. For blood pressure 90 tablet 3  . aspirin EC 81 MG tablet Take 1 tablet (81 mg total) by mouth daily. 30 tablet 3  . atorvastatin (LIPITOR) 40 MG tablet Take 1 tablet (40 mg total) by mouth daily at 6 PM. For cholesterol 90 tablet 3  . Cholecalciferol (VITAMIN D) 2000 UNITS CAPS Take 1 capsule (2,000 Units total) by mouth daily. 30 capsule 3  . travoprost, benzalkonium, (TRAVATAN) 0.004 % ophthalmic solution Place 1 drop into both eyes at bedtime.      Marland Kitchen zolpidem (AMBIEN) 5 MG tablet TAKE 1 TABLET BY MOUTH AT BEDTIME AS NEEDED FOR SLEEP 30 tablet 0   No current facility-administered medications for this visit.    Allergies:   Citalopram and Trazodone and nefazodone    Social History:  The patient  reports that he has never smoked. He has never used smokeless tobacco. He reports that he drinks alcohol. He reports that he does not use illicit drugs.   Family History:  The patient's family history includes Cancer in his father and mother.    ROS:   Please see the history of present illness.   Review of Systems  HENT: Positive for hearing loss.   Neurological: Positive for dizziness and loss of balance.  All other systems reviewed and are negative.     PHYSICAL EXAM: VS:  BP 130/66 mmHg  Pulse 81  Ht 6' (1.829 m)  Wt 208 lb (94.348 kg)  BMI 28.20 kg/m2    Wt Readings from Last 3 Encounters:  12/21/14 208 lb (94.348 kg)  11/09/14 213 lb (96.616 kg)  09/27/14 213 lb (96.616 kg)     GEN: Well nourished, well developed, in no acute distress HEENT: normal Neck: no JVD,  no masses Cardiac:  Normal S1/S2, RRR; no murmur ,  no rubs or gallops, no edema  Respiratory:  clear to auscultation  bilaterally, no wheezing, rhonchi or rales. GI: soft, nontender, nondistended, + BS MS: no deformity or atrophy Skin: warm and dry  Neuro:  CNs II-XII intact, Strength and sensation are intact Psych: Normal affect   EKG:  EKG is ordered today.  It demonstrates:   NSR, HR 81, normal axis, first-degree AV block (PR 220 ms), poor R-wave progression, PACs, prolonged QT (QTc 513 ms), no change from prior tracing   Recent Labs: 04/02/2014: ALT 12; BUN 15; Creatinine 1.08; Hemoglobin 15.1; Platelets 208; Potassium 4.1; Sodium 142; TSH 2.050    Lipid Panel    Component Value Date/Time   CHOL 130 12/19/2013   TRIG 209* 12/19/2013   HDL 42 12/19/2013   LDLCALC 46 12/19/2013      ASSESSMENT AND PLAN:  Pre-operative cardiovascular examination His vocal cord biopsy needs to be performed under general anesthesia. The patient has symptoms of dyspnea on exertion as well as exertional dizziness. He does have evidence of PACs on ECG today. When he got up to the exam table, he was having more frequent PACs on exam. He has a history of nonobstructive CAD. Records indicate he had a 50-70% LAD lesion. It is been 2 years since he had an echocardiogram. At that time he had mild to moderate aortic insufficiency. It has been 3 years since his last assessment for ischemia. The patient requires further workup prior to his noncardiac surgery. Of note, he has also been diagnosed with prostate cancer. Staging is not complete. It is not clear whether he will need surgery for this diagnosis as well.  -  Arrange 2-D echocardiogram  -  Arrange Lexiscan Myoview  -  Arrange 48-hour Holter  -  Return for close follow-up in 1-2 weeks.  MVP (mitral valve prolapse) S/P MVR (mitral valve repair) Last echo I can pull up in the system is a TEE.  It does not mention the MV.  He does note worsening dyspnea with exertion as well as dizziness. He will need a FU Echo arranged.    -  Arrange 2-D Echo.  -  Continue SBE  prophylaxis.  Paroxysmal atrial fibrillation S/P Maze operation for atrial fibrillation Maintaining NSR. He does have frequent PACs. He had a run of PACs on his ECG today. His heart rate increased when he got up to the exam table. Question if he would benefit from taking a beta blocker. Given his dizziness, I'll arrange a 48 hour Holter as outlined above.  Essential hypertension Controlled.  Coronary artery disease (records indicate LAD 50-70% in past)  Continue statin, aspirin. Given his dyspnea and exertion, I will arrange a Lexiscan Myoview.  Dizziness Etiology not clear. Arrange Holter monitor as outlined.   Current medicines are reviewed at length with the patient today.  Concerns regarding medicines are as outlined above.  The following changes have been made:    None    Labs/ tests ordered today include:  Orders Placed This Encounter  Procedures  . Myocardial Perfusion Imaging  . Holter monitor - 48 hour  . EKG 12-Lead  . Echocardiogram    Disposition:   FU with Dr. Fransico Him or me 1-2 weeks.   Signed, Versie Starks, MHS 12/21/2014 12:40 PM    [facility] [address], [place], [place]  [address] Phone: [phone]; Fax: [phone]

## 2014-12-21 ENCOUNTER — Ambulatory Visit (INDEPENDENT_AMBULATORY_CARE_PROVIDER_SITE_OTHER): Payer: Medicare Other | Admitting: Physician Assistant

## 2014-12-21 ENCOUNTER — Encounter: Payer: Self-pay | Admitting: Physician Assistant

## 2014-12-21 VITALS — BP 130/66 | HR 81 | Ht 72.0 in | Wt 208.0 lb

## 2014-12-21 DIAGNOSIS — Z01810 Encounter for preprocedural cardiovascular examination: Secondary | ICD-10-CM

## 2014-12-21 DIAGNOSIS — Z8679 Personal history of other diseases of the circulatory system: Secondary | ICD-10-CM

## 2014-12-21 DIAGNOSIS — I480 Paroxysmal atrial fibrillation: Secondary | ICD-10-CM

## 2014-12-21 DIAGNOSIS — R42 Dizziness and giddiness: Secondary | ICD-10-CM

## 2014-12-21 DIAGNOSIS — R0602 Shortness of breath: Secondary | ICD-10-CM

## 2014-12-21 DIAGNOSIS — I2510 Atherosclerotic heart disease of native coronary artery without angina pectoris: Secondary | ICD-10-CM

## 2014-12-21 DIAGNOSIS — Z9889 Other specified postprocedural states: Secondary | ICD-10-CM

## 2014-12-21 DIAGNOSIS — I10 Essential (primary) hypertension: Secondary | ICD-10-CM

## 2014-12-21 DIAGNOSIS — I341 Nonrheumatic mitral (valve) prolapse: Secondary | ICD-10-CM

## 2014-12-21 NOTE — Patient Instructions (Signed)
Medication Instructions:  Your physician recommends that you continue on your current medications as directed. Please refer to the Current Medication list given to you today.  Labwork: NONE  Testing/Procedures: NEEDS ALL TEST SCHEDULE ASAP- NEEDS BEFORE SURGERY Your physician has requested that you have an echocardiogram. Echocardiography is a painless test that uses sound waves to create images of your heart. It provides your doctor with information about the size and shape of your heart and how well your heart's chambers and valves are working. This procedure takes approximately one hour. There are no restrictions for this procedure.  Your physician has requested that you have a lexiscan myoview. For further information please visit [email or web address]. Please follow instruction sheet, as given.  Your physician has recommended that you wear a holter monitor. Holter monitors are medical devices that record the heart's electrical activity. Doctors most often use these monitors to diagnose arrhythmias. Arrhythmias are problems with the speed or rhythm of the heartbeat. The monitor is a small, portable device. You can wear one while you do your normal daily activities. This is usually used to diagnose what is causing palpitations/syncope (passing out).  Follow-Up: Your physician recommends that you schedule a follow-up appointment in: 1 to 2 weeks with Dr. Radford Pax or with Richardson Dopp PA when Dr. Radford Pax is in the office.   Any Other Special Instructions Will Be Listed Below (If Applicable).

## 2014-12-24 ENCOUNTER — Telehealth: Payer: Self-pay | Admitting: *Deleted

## 2014-12-24 NOTE — Telephone Encounter (Signed)
S/w pt about scheduleing his f/u w/PA after his test arre done 01/03/15. Pt then asked for me to please call his daughter Hilda Blades. I then lmtcb on Debra's phone to schedule appt. w/SW same day Dr. Radford Pax is in the office.

## 2014-12-24 NOTE — Telephone Encounter (Signed)
Pt's daughter Hilda Blades cb and scheduled pt for his f/u after his testing is complete. Appt with Brynda Rim. PA 6/3 same day Dr. Radford Pax is in the office.

## 2014-12-25 ENCOUNTER — Encounter (HOSPITAL_COMMUNITY)
Admission: RE | Admit: 2014-12-25 | Discharge: 2014-12-25 | Disposition: A | Discharge status: Home / Self Care | Payer: Medicare Other | Source: Ambulatory Visit | Attending: Urology | Admitting: Urology

## 2014-12-25 DIAGNOSIS — C61 Malignant neoplasm of prostate: Secondary | ICD-10-CM | POA: Diagnosis not present

## 2014-12-25 MED ORDER — TECHNETIUM TC 99M MEDRONATE IV KIT
25.2000 | PACK | Freq: Once | INTRAVENOUS | Status: AC | PRN
  Administered 2014-12-25: 25.2 via INTRAVENOUS

## 2014-12-27 ENCOUNTER — Ambulatory Visit: Payer: Self-pay | Admitting: Physician Assistant

## 2015-01-02 ENCOUNTER — Telehealth (HOSPITAL_COMMUNITY): Payer: Self-pay

## 2015-01-02 NOTE — Telephone Encounter (Signed)
Left message on voicemail per DPR in reference to upcoming appointment scheduled on 07-26-2015 at 8:30am with detailed instructions given per Myocardial Perfusion Study Information Sheet for the test. Patient to arrive at 07:15am for Echo.Phone number given for call back for any questions. Oletta Lamas, Tamasha Laplante A

## 2015-01-03 ENCOUNTER — Other Ambulatory Visit: Payer: Self-pay

## 2015-01-03 ENCOUNTER — Ambulatory Visit (HOSPITAL_BASED_OUTPATIENT_CLINIC_OR_DEPARTMENT_OTHER): Payer: Medicare Other

## 2015-01-03 ENCOUNTER — Ambulatory Visit (INDEPENDENT_AMBULATORY_CARE_PROVIDER_SITE_OTHER): Payer: Medicare Other

## 2015-01-03 ENCOUNTER — Ambulatory Visit (HOSPITAL_COMMUNITY): Payer: Medicare Other | Attending: Internal Medicine

## 2015-01-03 DIAGNOSIS — I080 Rheumatic disorders of both mitral and aortic valves: Secondary | ICD-10-CM | POA: Diagnosis not present

## 2015-01-03 DIAGNOSIS — I2510 Atherosclerotic heart disease of native coronary artery without angina pectoris: Secondary | ICD-10-CM

## 2015-01-03 DIAGNOSIS — Z01810 Encounter for preprocedural cardiovascular examination: Secondary | ICD-10-CM

## 2015-01-03 DIAGNOSIS — R0602 Shortness of breath: Secondary | ICD-10-CM | POA: Diagnosis not present

## 2015-01-03 DIAGNOSIS — I10 Essential (primary) hypertension: Secondary | ICD-10-CM | POA: Insufficient documentation

## 2015-01-03 DIAGNOSIS — Z85038 Personal history of other malignant neoplasm of large intestine: Secondary | ICD-10-CM | POA: Insufficient documentation

## 2015-01-03 DIAGNOSIS — Z9889 Other specified postprocedural states: Secondary | ICD-10-CM

## 2015-01-03 DIAGNOSIS — I4891 Unspecified atrial fibrillation: Secondary | ICD-10-CM | POA: Diagnosis not present

## 2015-01-03 DIAGNOSIS — H409 Unspecified glaucoma: Secondary | ICD-10-CM | POA: Insufficient documentation

## 2015-01-03 DIAGNOSIS — R42 Dizziness and giddiness: Secondary | ICD-10-CM | POA: Diagnosis not present

## 2015-01-03 DIAGNOSIS — I712 Thoracic aortic aneurysm, without rupture: Secondary | ICD-10-CM | POA: Diagnosis not present

## 2015-01-03 LAB — MYOCARDIAL PERFUSION IMAGING
CHL CUP NUCLEAR SRS: 6
CHL CUP NUCLEAR SSS: 7
CHL CUP STRESS STAGE 2 GRADE: 0 %
CHL CUP STRESS STAGE 2 SPEED: 0 mph
CHL CUP STRESS STAGE 3 DBP: 67 mmHg
CHL CUP STRESS STAGE 3 SBP: 132 mmHg
CHL CUP STRESS STAGE 3 SPEED: 0 mph
CHL CUP STRESS STAGE 4 GRADE: 0 %
CHL CUP STRESS STAGE 6 SPEED: 0 mph
Estimated workload: 1 METS
Peak HR: 71 {beats}/min
Percent of predicted max HR: 53 %
RATE: 0.34
Rest HR: 65 {beats}/min
SDS: 1
Stage 1 DBP: 68 mmHg
Stage 1 Grade: 0 %
Stage 1 HR: 65 {beats}/min
Stage 1 SBP: 138 mmHg
Stage 1 Speed: 0 mph
Stage 2 HR: 65 {beats}/min
Stage 3 Grade: 0 %
Stage 3 HR: 65 {beats}/min
Stage 4 HR: 71 {beats}/min
Stage 4 Speed: 0 mph
Stage 5 DBP: 62 mmHg
Stage 5 Grade: 0 %
Stage 5 HR: 74 {beats}/min
Stage 5 SBP: 121 mmHg
Stage 5 Speed: 0 mph
Stage 6 Grade: 0 %
Stage 6 HR: 71 {beats}/min
TID: 1.02

## 2015-01-03 MED ORDER — TECHNETIUM TC 99M SESTAMIBI GENERIC - CARDIOLITE
30.0000 | Freq: Once | INTRAVENOUS | Status: AC | PRN
  Administered 2015-01-03: 30 via INTRAVENOUS

## 2015-01-03 MED ORDER — TECHNETIUM TC 99M SESTAMIBI GENERIC - CARDIOLITE
11.0000 | Freq: Once | INTRAVENOUS | Status: AC | PRN
  Administered 2015-01-03: 11 via INTRAVENOUS

## 2015-01-03 MED ORDER — REGADENOSON 0.4 MG/5ML IV SOLN
0.4000 mg | Freq: Once | INTRAVENOUS | Status: AC
  Administered 2015-01-03: 0.4 mg via INTRAVENOUS

## 2015-01-04 ENCOUNTER — Encounter: Payer: Self-pay | Admitting: Physician Assistant

## 2015-01-04 ENCOUNTER — Telehealth: Payer: Self-pay | Admitting: *Deleted

## 2015-01-04 NOTE — Telephone Encounter (Signed)
Pt notified of myoview and echo results by phone with verbal understanding.

## 2015-01-10 ENCOUNTER — Encounter: Payer: Self-pay | Admitting: Internal Medicine

## 2015-01-10 ENCOUNTER — Ambulatory Visit (INDEPENDENT_AMBULATORY_CARE_PROVIDER_SITE_OTHER): Payer: Medicare Other | Admitting: Internal Medicine

## 2015-01-10 VITALS — BP 136/76 | HR 73 | Temp 97.7°F | Resp 20 | Ht 72.0 in | Wt 210.4 lb

## 2015-01-10 DIAGNOSIS — G3184 Mild cognitive impairment, so stated: Secondary | ICD-10-CM

## 2015-01-10 DIAGNOSIS — I2510 Atherosclerotic heart disease of native coronary artery without angina pectoris: Secondary | ICD-10-CM | POA: Diagnosis not present

## 2015-01-10 DIAGNOSIS — C61 Malignant neoplasm of prostate: Secondary | ICD-10-CM | POA: Insufficient documentation

## 2015-01-10 DIAGNOSIS — F329 Major depressive disorder, single episode, unspecified: Secondary | ICD-10-CM

## 2015-01-10 DIAGNOSIS — R490 Dysphonia: Secondary | ICD-10-CM | POA: Diagnosis not present

## 2015-01-10 DIAGNOSIS — I482 Chronic atrial fibrillation, unspecified: Secondary | ICD-10-CM

## 2015-01-10 DIAGNOSIS — R42 Dizziness and giddiness: Secondary | ICD-10-CM

## 2015-01-10 DIAGNOSIS — F32A Depression, unspecified: Secondary | ICD-10-CM

## 2015-01-10 MED ORDER — MIRTAZAPINE 7.5 MG PO TABS: 7.5000 mg | ORAL_TABLET | Freq: Every day | ORAL | Status: DC

## 2015-01-10 MED ORDER — ZOLPIDEM TARTRATE 5 MG PO TABS: 5.0000 mg | ORAL_TABLET | Freq: Every evening | ORAL | Status: DC | PRN

## 2015-01-10 NOTE — Progress Notes (Signed)
Patient ID: Timothy Suarez, male   DOB: 30-Mar-1925, 81 y.o.   MRN: [id number]   Location:  [facility]  Code Status: DNR Goals of Care: Advanced Directive information Does patient have an advance directive?: Yes, Type of Advance Directive: [place];Living will;Out of facility DNR (pink MOST or yellow form), Pre-existing out of facility DNR order (yellow form or pink MOST form): Yellow form placed in chart (order not valid for inpatient use), Does patient want to make changes to advanced directive?: No - Patient declined   Chief Complaint  Patient presents with  . Medical Management of Chronic Issues    HPI: Patient is a 81 y.o. white male seen in the office today for med mgt of chronic diseases.    Has had a hectic week or two. Almost felt relieved when he was told he had prostate cancer by urology after biopsy.  Had chest xray which was clear.  He has an upcoming scan.  Says urinary frequency persists, but does not have difficulty with his stream.  Sometimes has difficulty making it in time.  Has seen urology 3x now.    Has appt with Dr. Radford Pax tomorrow about his heart.  Had some tests done by her.  Nuclear stress test was 01/03/15.  Has already been told results are good.  Remains dizzy at times.  It's been something else this week.   Forgot hearing aides, then went to the wrong office first.    MMSE - [place] Exam 11/09/2014 04/02/2014  Orientation to time 5 4  Orientation to Place 5 5  Registration 3 3  Attention/ Calculation 5 3  Recall 1 1  Language- name 2 objects 2 2  Language- repeat 1 1  Language- follow 3 step command 3 3  Language- read & follow direction 1 1  Write a sentence 1 1  Copy design 1 1  Total score 28 25  seems he's getting early dementia  Went to ENT, Dr. Constance Holster.  Is for biopsy upcoming of vocal cord.  Did laryngoscopy--saw little something, but has not been able to f/u on that b/c of  need for cardiology clearance.    Continues to complain of his dizziness and all workups of this have been negative.    Says surprisingly his mood gets better when he goes down to eat and talks to people.  Talks with friends and that helps him.  When alone, he gets too weak to walk with his dizziness.  Goes to gym and walks in the evening when his dizziness dissipates.    Weight is stable. Appetite poor.  Occasionally misses a meal.     Review of Systems:  Review of Systems  Constitutional: Positive for malaise/fatigue. Negative for fever and chills.  Respiratory: Negative for shortness of breath.   Cardiovascular: Negative for chest pain and palpitations.  Gastrointestinal: Negative for abdominal pain.  Genitourinary: Positive for urgency and frequency. Negative for dysuria.  Musculoskeletal: Negative for falls.  Neurological: Positive for dizziness.  Psychiatric/Behavioral: Positive for depression and memory loss. The patient is nervous/anxious and has insomnia.     Past Medical History  Diagnosis Date  . MVP (mitral valve prolapse)     S/P Rt mini thoractomy for Mitral Valve repair  . Paroxysmal atrial fibrillation     S/P Maze procedure  . HTN (hypertension)   . Depression   . Hernia   . Aortic valve insufficiency, acquired   .  Dyslipidemia   . Glaucoma   . BPH (benign prostatic hyperplasia)   . Colon cancer     colon s/p partial colectomy  . Coronary artery disease     50-70% LAD  . Heart valve replaced   . A-fib   . Cerebral ischemia   . Sleep disturbance   . Mild cognitive impairment   . Thoracic aortic aneurysm   . Major depression   . Hyperlipidemia   . History of echocardiogram     Echo 5/16:  Mild LVH, EF 50-55%, Gr 1 DD, septal HK, Ao sclerosis without stenosis, mild AI, MV repair ok with borderline mild MS (mean 4 mmHg), mild MR, mild LAE, mod RAE, mild TR, PASP 30 mmHg  . Hx of cardiovascular stress test     Lexiscan Myoview 5/16:  Apical thinning, EF not  gated, no ischemia. Low Risk    Past Surgical History  Procedure Laterality Date  . Flexhd patch repair of chest wall hernia.  01/28/11    [name]  . Colectomy  2004    Dr [name]  . Tympanoplasty    . Mitral valve repair  12/27/2008    complex valvuloplasty with 59mm Memo 3D annuloplasty ring via right minithoracotomy  . Maze  12/27/2008    left side lesion set  . Mitral valve repair      minimally invasive  . Tee without cardioversion N/A 01/03/2013    Procedure: TRANSESOPHAGEAL ECHOCARDIOGRAM (TEE);  Surgeon: Sueanne Margarita, MD;  Location: [facility];  Service: Cardiovascular;  Laterality: N/A;  . Total knee arthroplasty Right 2003    Allergies  Allergen Reactions  . Citalopram Nausea Only  . Trazodone And Nefazodone Other (See Comments)    Dry mouth   Medications: Patient's Medications  New Prescriptions   No medications on file  Previous Medications   AMLODIPINE (NORVASC) 5 MG TABLET    Take 1 tablet (5 mg total) by mouth daily. For blood pressure   ASPIRIN EC 81 MG TABLET    Take 1 tablet (81 mg total) by mouth daily.   ATORVASTATIN (LIPITOR) 40 MG TABLET    Take 1 tablet (40 mg total) by mouth daily at 6 PM. For cholesterol   CHOLECALCIFEROL (VITAMIN D) 2000 UNITS CAPS    Take 1 capsule (2,000 Units total) by mouth daily.   TRAVOPROST, BENZALKONIUM, (TRAVATAN) 0.004 % OPHTHALMIC SOLUTION    Place 1 drop into both eyes at bedtime.     ZOLPIDEM (AMBIEN) 5 MG TABLET    TAKE 1 TABLET BY MOUTH AT BEDTIME AS NEEDED FOR SLEEP  Modified Medications   No medications on file  Discontinued Medications   No medications on file    Physical Exam: Filed Vitals:   01/10/15 1053  BP: 136/76  Pulse: 73  Temp: 97.7 F (36.5 C)  TempSrc: Oral  Resp: 20  Height: 6' (1.829 m)  Weight: 210 lb 6.4 oz (95.437 kg)  SpO2: 97%   Physical Exam  Constitutional: He appears well-developed and well-nourished. No distress.  Cardiovascular:  irreg irreg  Pulmonary/Chest: Effort normal  and breath sounds normal. No respiratory distress.  Abdominal: Bowel sounds are normal.  Musculoskeletal: Normal range of motion.  Walks with cane which he forgot when he was about to leave  Neurological: He is alert.  Psychiatric:  Affect flatter than in past    Labs reviewed: Basic Metabolic Panel:  Recent Labs  04/02/14 1624  NA 142  K 4.1  CL 103  CO2 21  GLUCOSE 101*  BUN 15  CREATININE 1.08  CALCIUM 9.1  TSH 2.050   Liver Function Tests:  Recent Labs  04/02/14 1624  AST 13  ALT 12  ALKPHOS 107  BILITOT 0.4  PROT 6.6   No results for input(s): LIPASE, AMYLASE in the last 8760 hours. No results for input(s): AMMONIA in the last 8760 hours. CBC:  Recent Labs  04/02/14 1624  WBC 10.3  NEUTROABS 6.6  HGB 15.1  HCT 44.2  MCV 95  PLT 208   Lipid Panel: No results for input(s): CHOL, HDL, LDLCALC, TRIG, CHOLHDL, LDLDIRECT in the last 8760 hours. Lab Results  Component Value Date   HGBA1C  Oct 11, 202010    5.3 (NOTE) The ADA recommends the following therapeutic goal for glycemic control related to Hgb A1c measurement: Goal of therapy: <6.5 Hgb A1c  Reference: American Diabetes Association: Clinical Practice Recommendations 2010, Diabetes Care, 2010, 33: (Suppl  1).    Assessment/Plan 1. Prostate cancer -Gleason 3+4 in 8 cores at the right base, left base, right mid gland and left mid gland and 4+3 at the right apex which is high risk prostate ca -bone scan was negative for bony mets -has pending CT abd/pelvis to r/o pelvic or retroperitoneal adenopathy  2. Depression - agrees to try remeron, but says he was on it at some point in the past and does not know why it was discontinued - mirtazapine (REMERON) 7.5 MG tablet; Take 1 tablet (7.5 mg total) by mouth at bedtime.  Dispense: 30 tablet; Refill: 3  3. Hoarseness of voice -has upcoming appt with Dr. Constance Holster for biopsy of his vocal cord due to persistent hoarseness -has slightly improved with use of PPI  otc  4. Coronary artery disease involving native coronary artery of native heart without angina pectoris -cont statin, asa 81 mg -follows with cardiology and has just had nuclear stress as part of clearance for his biopsy of vocal cord  5. Dizziness and giddiness -persists, cause has never been identified  6. Mild cognitive impairment -seems he has some early vascular dementia to me -lives at [place] to monitor -fortunately, his daughter has been helping him -may be worsened by his anxiety and depression related to his prostate ca and worries about his hoarseness so I opted to treat his mood to see if he has any improvement -prior CT showed chronic ischemic vessel changes and atrophy  7. Chronic atrial fibrillation -cont baby asa -discussed idea of xarelto use, but he did not want to do this -has appt with Dr. Radford Pax tomorrow   Next appt: 2 mos f/u on depression, memory  [facility] L. Chiquetta Langner, D.O. [facility] [address], [place] [address] Cell Phone (Mon-Fri 8am-5pm):  [phone] On Call:  [phone] & follow prompts after 5pm & weekends Office Phone:  [phone] Office Fax:  [phone]

## 2015-01-10 NOTE — Progress Notes (Signed)
Cardiology Office Note   Date:  01/11/2015   ID:  Timothy Suarez, DOB [birth year], MRN [id number]  PCP:  Timothy Kinnier, DO  Cardiologist:  Dr. Fransico Him     Chief Complaint  Patient presents with  . Atrial Fibrillation  . Coronary Artery Disease     History of Present Illness: Timothy Suarez is a 81 y.o. male with a hx of PAF and MVP with severe MR s/p R mini-thoracotomy for MV repair and MAZE procedure 12/2008, chest wall hernia at thoracotomy site s/p repair, HTN, non-obstructive CAD, colon CA s/p partial colectomy in 2004. Ejection fraction has been depressed in the past at 45%. Most recent transesophageal echocardiogram in 12/2012 demonstrated an EF of 50-55%. This also demonstrated moderate aortic insufficiency. Last seen by Dr. Radford Pax 12/2011.  The patient needs biopsy of his right vocal cord secondary to a history of hoarseness. This needs to be done under general anesthesia. The surgeon is Dr. Izora Gala.  I saw him recently for surgical clearance. He complained of difficulty with dizziness for a long time now. He has seen several different specialists. He also noted dyspnea with exertion that seemed to have gotten worse over the last 6 months (NYHA 2b-3) but had no chest pain.    I set him up for an echo, Myoview and Holter.  His echo demonstrated normal LVF and stable MV repair.  Myoview was neg for ischemia.  Holter monitor is still in process.  He returns for follow-up today. He continues to have occasional dizziness. This is been a symptom for a long time.  He does admit to dizziness upon standing. He denies syncope or near-syncope. He denies a spinning sensation. He denies significant shortness of breath. He denies chest pain, orthopnea, PND or significant pedal edema. He denies fever, cough, melena, hematochezia.   Studies/Reports Reviewed Today:  Myoview 01/03/15 The study is normal. This is a low risk study. Nongated study because of PVCs. Consider 2D echo to  evaluate LV size and systolic function.  Echo 01/03/15 - mild LVH. EF 50% to 55%.Septal hypokinesis. grade 1 diastolicdysfunction. - Aortic Sclerosis without stenosis. mild regurgitation. - Mitral valve: s/p Annuloplasty. Mildly thickened leaflets -borderline mild stenosis. There was mild regurgitation. Valvearea by continuity equation (using LVOT flow): 1.89 cm^2. - Left atrium: The atrium was mildly dilated at 36 ml/m2. - Right atrium: Moderately dilated. - Tricuspid valve: There was mild regurgitation. - Pulmonary arteries: PA peak pressure: 30 mm Hg (S). Impressions:  - Compared to the prior study in 2014, there have been no significant changes.  Carotid US 04/06/14 IMPRESSION: 1. Mild bilateral carotid atherosclerotic vascular disease. No hemodynamically significant stenosis. 2. Vertebrals are patent with antegrade flow.  TEE 01/03/13 - EF 50% to 55%. - Aortic valve: Moderate regurgitation directed eccentrically in the LVOT and towards the mitral anterior leaflet. - Right atrium: No evidence of thrombus in the atrial cavity or appendage. - Tricuspid valve: No evidence of vegetation. - Pulmonic valve: No evidence of vegetation.  ETT-Myoview 12/2011 Diaphragmatic attenuation; no ischemia or scar, EF 43%  Echo 12/2008 (before surgery) 1. Left ventricle: Systolic function was normal. Wall motion wasnormal; there were no regional wall motion abnormalities. 2. Aortic valve: Mild to moderate regurgitation directed towards themitral anterior leaflet. 3. Mitral valve: Severe, holosystolic prolapse, involving the medialscallop of the posterior leaflet. Severe regurgitation directedeccentrically and toward the free wall. 4. Staged echo: Good LVEF. Post repair images demonstrated noresidual MR, no MS and no perivalvular leak.  Past Medical History  Diagnosis Date  . MVP (mitral valve prolapse)     S/P Rt mini thoractomy for Mitral Valve repair  . Paroxysmal atrial fibrillation       S/P Maze procedure  . HTN (hypertension)   . Depression   . Hernia   . Aortic valve insufficiency, acquired   . Dyslipidemia   . Glaucoma   . BPH (benign prostatic hyperplasia)   . Colon cancer     colon s/p partial colectomy  . Coronary artery disease     50-70% LAD  . Heart valve replaced   . A-fib   . Cerebral ischemia   . Sleep disturbance   . Mild cognitive impairment   . Thoracic aortic aneurysm   . Major depression   . Hyperlipidemia   . History of echocardiogram     Echo 5/16:  Mild LVH, EF 50-55%, Gr 1 DD, septal HK, Ao sclerosis without stenosis, mild AI, MV repair ok with borderline mild MS (mean 4 mmHg), mild MR, mild LAE, mod RAE, mild TR, PASP 30 mmHg  . Hx of cardiovascular stress test     Lexiscan Myoview 5/16:  Apical thinning, EF not gated, no ischemia. Low Risk    Past Surgical History  Procedure Laterality Date  . Flexhd patch repair of chest wall hernia.  01/28/11    [name]  . Colectomy  2004    Dr [name]  . Tympanoplasty    . Mitral valve repair  12/27/2008    complex valvuloplasty with 21mm Memo 3D annuloplasty ring via right minithoracotomy  . Maze  12/27/2008    left side lesion set  . Mitral valve repair      minimally invasive  . Tee without cardioversion N/A 01/03/2013    Procedure: TRANSESOPHAGEAL ECHOCARDIOGRAM (TEE);  Surgeon: Sueanne Margarita, MD;  Location: [place];  Service: Cardiovascular;  Laterality: N/A;  . Total knee arthroplasty Right 2003     Current Outpatient Prescriptions  Medication Sig Dispense Refill  . amLODipine (NORVASC) 2.5 MG tablet Take 1 tablet (2.5 mg total) by mouth daily. For blood pressure 30 tablet 11  . aspirin EC 81 MG tablet Take 1 tablet (81 mg total) by mouth daily. 30 tablet 3  . atorvastatin (LIPITOR) 40 MG tablet Take 1 tablet (40 mg total) by mouth daily at 6 PM. For cholesterol 90 tablet 3  . Cholecalciferol (VITAMIN D) 2000 UNITS CAPS Take 1 capsule (2,000 Units total) by mouth daily. 30 capsule  3  . travoprost, benzalkonium, (TRAVATAN) 0.004 % ophthalmic solution Place 1 drop into both eyes at bedtime.      Marland Kitchen zolpidem (AMBIEN) 5 MG tablet Take 1 tablet (5 mg total) by mouth at bedtime as needed. for sleep 30 tablet 0   No current facility-administered medications for this visit.    Allergies:   Citalopram and Trazodone and nefazodone    Social History:  The patient  reports that he has never smoked. He has never used smokeless tobacco. He reports that he drinks alcohol. He reports that he does not use illicit drugs.   Family History:  The patient's family history includes Cancer in his father and mother. There is no history of Heart attack or Stroke.    ROS:   Please see the history of present illness.   Review of Systems  All other systems reviewed and are negative.     PHYSICAL EXAM: VS:  BP 120/59 mmHg  Pulse 77  Ht 6' (1.829 m)  Wt 208 lb (94.348 kg)  BMI 28.20 kg/m2    Wt Readings from Last 3 Encounters:  01/11/15 208 lb (94.348 kg)  01/10/15 210 lb 6.4 oz (95.437 kg)  01/03/15 211 lb (95.709 kg)     GEN: Well nourished, well developed, in no acute distress HEENT: normal Neck: no JVD,  no masses Cardiac:  Normal S1/S2, RRR; no murmur ,  no rubs or gallops, no edema  Respiratory:  clear to auscultation bilaterally, no wheezing, rhonchi or rales. GI: soft, nontender, nondistended, + BS MS: no deformity or atrophy Skin: warm and dry  Neuro:  CNs II-XII intact, Strength and sensation are intact Psych: Normal affect   EKG:  EKG is ordered today.  It demonstrates:   NSR, HR 77, normal axis, PVCs, QTc 531   Recent Labs: 04/02/2014: ALT 12; BUN 15; Creatinine 1.08; Hemoglobin 15.1; Platelets 208; Potassium 4.1; Sodium 142; TSH 2.050    Lipid Panel    Component Value Date/Time   CHOL 130 12/19/2013   TRIG 209* 12/19/2013   HDL 42 12/19/2013   LDLCALC 46 12/19/2013      ASSESSMENT AND PLAN:  Pre-operative cardiovascular examination:  Recent  Myoview was low risk. Echocardiogram demonstrates stable mitral valve repair. He does not require any further cardiac workup prior to his non-cardiac surgery and should be at acceptable risk.  MVP (mitral valve prolapse) S/P MVR (mitral valve repair):  Mitral valve repair stable on recent echocardiogram. Continue SBE prophylaxis.  Paroxysmal atrial fibrillation S/P Maze operation for atrial fibrillation:  Maintaining NSR. He does have frequent PACs. Recent Holter is pending. Question if he is having runs of atrial tachycardia versus high burden of PVCs. I will review his Holter when it returns. We could consider stopping his amlodipine and placing him on low-dose beta blocker.  Essential hypertension:  Controlled.  Coronary artery disease (records indicate LAD 50-70% in past):  Continue statin, aspirin. Recent Lexiscan Myoview low risk.  Dizziness:  Etiology not clear. Holter monitor is pending. He does have some symptoms of orthostatic intolerance. I will reduce his amlodipine to 2.5 mg daily. I have asked him to get compression stockings over-the-counter and wear them from morning to night. I will see what his Holter shows. If he has runs of atrial tachycardia or high burden of PVCs, I may consider placing him on low-dose beta blocker.  Prolonged QT:  No syncope.  Avoid QT prolonging drugs.  I advised him to not take Remeron.  FU with PCP for an alternative drug.    Current medicines are reviewed at length with the patient today.  Concerns regarding medicines are as outlined above.  The following changes have been made:    Decrease amlodipine to 2.5 mg daily    Labs/ tests ordered today include:   Orders Placed This Encounter  Procedures  . EKG 12-Lead    Disposition:   FU Dr. Radford Pax 6 months   Signed, Richardson Dopp, PA-C, MHS 01/11/2015 2:16 PM    [facility] [place], [place], [place]  [id number] Phone: [phone]; Fax: [phone]

## 2015-01-11 ENCOUNTER — Encounter: Payer: Self-pay | Admitting: Physician Assistant

## 2015-01-11 ENCOUNTER — Ambulatory Visit (INDEPENDENT_AMBULATORY_CARE_PROVIDER_SITE_OTHER): Payer: Medicare Other | Admitting: Physician Assistant

## 2015-01-11 VITALS — BP 120/59 | HR 77 | Ht 72.0 in | Wt 208.0 lb

## 2015-01-11 DIAGNOSIS — I480 Paroxysmal atrial fibrillation: Secondary | ICD-10-CM

## 2015-01-11 DIAGNOSIS — Z01810 Encounter for preprocedural cardiovascular examination: Secondary | ICD-10-CM

## 2015-01-11 DIAGNOSIS — I10 Essential (primary) hypertension: Secondary | ICD-10-CM | POA: Diagnosis not present

## 2015-01-11 DIAGNOSIS — R42 Dizziness and giddiness: Secondary | ICD-10-CM

## 2015-01-11 DIAGNOSIS — R9431 Abnormal electrocardiogram [ECG] [EKG]: Secondary | ICD-10-CM

## 2015-01-11 DIAGNOSIS — I4581 Long QT syndrome: Secondary | ICD-10-CM

## 2015-01-11 DIAGNOSIS — Z9889 Other specified postprocedural states: Secondary | ICD-10-CM

## 2015-01-11 DIAGNOSIS — I2510 Atherosclerotic heart disease of native coronary artery without angina pectoris: Secondary | ICD-10-CM | POA: Diagnosis not present

## 2015-01-11 MED ORDER — AMLODIPINE BESYLATE 2.5 MG PO TABS: 2.5000 mg | ORAL_TABLET | Freq: Every day | ORAL | Status: DC

## 2015-01-11 NOTE — Patient Instructions (Signed)
Medication Instructions:  1. DECREASE NORVASC TO 2.5 MG DAILY; NEW RX SENT IN FOR THE 2.5 MG TABLET  Labwork: NONE  Testing/Procedures: NONE  Follow-Up: 6 MONTHS WITH DR. Radford Pax  Any Other Special Instructions Will Be Listed Below (If Applicable). 1. PICK UP SOME OTC COMPRESSION STOCKINGS AND WEAR FROM THE TIME YOU WAKE UP UNTIL YOU GO TO BED  2. PLEASE BE SURE TO LET YOUR PRIMARY CARE PHYSICIAN KNOW THAT YOU CANNOT TAKE THE REMERON DUE TO YOU HAVE PROLONGED QT

## 2015-01-18 ENCOUNTER — Telehealth: Payer: Self-pay | Admitting: Physician Assistant

## 2015-01-18 NOTE — Telephone Encounter (Signed)
New message      Calling to see if pt needs another appt or procedure or more test to be cleared for procedure

## 2015-01-18 NOTE — Telephone Encounter (Signed)
S/w pt and advixsed he did not need any further cardiac work up and was cleared for his non cardiac surgery. Pt asked for me to fax 6/3 OV from Richardson Dopp, [facility] to Dr. Izora Gala and Dr. Hollace Kinnier.

## 2015-01-21 ENCOUNTER — Other Ambulatory Visit: Payer: Self-pay

## 2015-01-21 ENCOUNTER — Telehealth: Payer: Self-pay

## 2015-01-21 MED ORDER — BUPROPION HCL ER (XL) 150 MG PO TB24: 150.0000 mg | ORAL_TABLET | Freq: Every day | ORAL | Status: DC

## 2015-01-21 NOTE — Telephone Encounter (Signed)
Spoke with patient informed him of the change of medication and that new medication  has been sent to pharmacy for him.

## 2015-01-24 ENCOUNTER — Telehealth: Payer: Self-pay | Admitting: Physician Assistant

## 2015-01-24 ENCOUNTER — Telehealth: Payer: Self-pay | Admitting: *Deleted

## 2015-01-24 ENCOUNTER — Other Ambulatory Visit: Payer: Self-pay | Admitting: *Deleted

## 2015-01-24 ENCOUNTER — Encounter: Payer: Self-pay | Admitting: *Deleted

## 2015-01-24 MED ORDER — METOPROLOL SUCCINATE ER 25 MG PO TB24: 12.5000 mg | ORAL_TABLET | Freq: Every day | ORAL | Status: DC

## 2015-01-24 NOTE — Telephone Encounter (Signed)
Please tell patient that his monitor is still waiting to be reviewed by Dr. Fransico Him. I did get a copy of it and he has a lot of extra beats from the top part of his heart (PACs) and bottom part (PVCs). These come in pairs sometimes and sometimes he has a run of these. Some of his symptoms may be related to this. He may feel better on a low dose beta-blocker. He has surgery coming up. I would not start anything until after his procedure. Start Toprol-XL 12.5 mg Once daily  Arrange FU with Dr. Fransico Him or, if she does not have anything, with me on a day she is here in 2 mos. Richardson Dopp, PA-C   01/24/2015 5:40 PM

## 2015-01-24 NOTE — Telephone Encounter (Signed)
Pt notified of monitor results by phone with verbal understanding. Pt scheduled w/Dr. Radford Pax 8/29 1:30 per Brynda Rim. PA 2 month f/u. Pt has memory issues and asked for me to send him a note with all instrcutions and appt. Will send out short letter to pt.

## 2015-01-28 ENCOUNTER — Ambulatory Visit: Payer: Self-pay | Admitting: Otolaryngology

## 2015-01-28 NOTE — Progress Notes (Signed)
Pt refused to complete SDW -pre-op assessment; requested that pre-op instructions be left on daughter's phone    ([name]) . Instructions included to stop otc vitamins, NSAID's and herbal medications. Dr. Therisa Doyne made aware of pt cardiac history; no new orders given.

## 2015-01-28 NOTE — H&P (Signed)
Assessment  Hoarseness (784.42) (R49.0). Discussed  3 month history of hoarseness, irregularity of the right vocal cord and ventricle. Recommend microlaryngoscopy with biopsy. He will need cardiac clearance first. Reason For Visit  Hoarseness. HPI  2 or 3 month history of hoarseness and weakness of the voice. He denies any other upper respiratory symptoms, sore throat, trouble swallowing, fever or chills. He is having no trouble drinking liquids. He is not losing weight. Allergies  No Known Drug Allergies. Current Meds  Ambien TABS (Zolpidem Tartrate);; RPT Norvasc TABS (AmLODIPine Besylate);; RPT Lipitor TABS (Atorvastatin Calcium);; RPT Travatan SOLN;; RPT Atorvastatin Calcium 40 MG Oral Tablet;; RPT. PMH  History of colectomy (V45.89) (Z90.49) History of mitral valve repair (V15.1) (Z98.89). PSH  Knee Surgery Tympanoplasty. Family Hx  Family history of cancer: Mother (V16.9) (Z80.9). Personal Hx  No caffeine use Non-smoker (V49.89) (Z78.9) Secondhand smoke exposure (V15.89) (Z77.22) Social alcohol use (Z78.9). ROS  Systemic: Not feeling tired (fatigue).  No fever, no night sweats, and no recent weight loss. Head: No headache. Eyes: No eye symptoms. Otolaryngeal: No hearing loss, no earache, no tinnitus, and no purulent nasal discharge.  No nasal passage blockage (stuffiness), no snoring, no sneezing, no hoarseness, and no sore throat. Cardiovascular: No chest pain or discomfort  and no palpitations. Pulmonary: No dyspnea, no cough, and no wheezing. Gastrointestinal: No dysphagia  and no heartburn.  No nausea, no abdominal pain, and no melena.  No diarrhea. Genitourinary: No dysuria. Endocrine: No muscle weakness. Musculoskeletal: No calf muscle cramps, no arthralgias, and no soft tissue swelling. Neurological: No dizziness, no fainting, no tingling, and no numbness. Psychological: No anxiety  and no depression. Skin: No rash. 12 system ROS was obtained and reviewed on  the Health Maintenance form dated today.  Positive responses are shown above.  If the symptom is not checked, the patient has denied it. Physical Exam  APPEARANCE: Well developed, well nourished, in no acute distress.  Normal affect, in a pleasant mood.  Oriented to time, place and person. COMMUNICATION: Weak and hoarse voice   HEAD & FACE:  No scars, lesions or masses of head and face.  Sinuses nontender to palpation.  Salivary glands without mass or tenderness.  Facial strength symmetric.  No facial lesion, scars, or mass. EYES: EOMI with normal primary gaze alignment. Visual acuity grossly intact.  PERRLA EXTERNAL EAR & NOSE: No scars, lesions or masses  EAC & TYMPANIC MEMBRANE:  EAC shows no obstructing lesions or debris and tympanic membranes are normal bilaterally with good movement to insufflation. GROSS HEARING: Diminished. He has bilateral hearing aids. TMJ:  Nontender  INTRANASAL EXAM: No polyps or purulence.  NASOPHARYNX: Normal, without lesions. LIPS, TEETH & GUMS: No lip lesions, normal dentition and normal gums. ORAL CAVITY/OROPHARYNX:  Oral mucosa moist without lesion or asymmetry of the palate, tongue, tonsil or posterior pharynx. NECK:  Supple without adenopathy or mass. THYROID:  Normal with no masses palpable.  NEUROLOGIC:  No gross CN deficits. No nystagmus noted.   LYMPHATIC:  No enlarged nodes palpable.   Flexible examination reveals mobile cords, with an irregularity of the right anterior vocal cord and fullness of the ventricle. Procedure  Fiberoptic Laryngoscopy Name: Timothy Suarez     Age: 81 year     The risks and benefits of this procedure have been thoroughly discussed with the patient/parent.  The most commons risks outlined included but were not limited to: injury  to the nasal mucosa or throat irritation.  The patient/parent was further informed that  there are other less common risks.  The patient/parent was given the opportunity to ask questions and all  such questions were answered to the patient/parent's satisfaction.  Patient/parent acknowledged the risks and has agreed to proceed.   Performing Provider: Izora Gala The risks of the procedure are minimal and were discussed with the patient today. Pre-op Diagnosis: hoarseness  Post-op Diagnosis:Same Allergy:  reviewed allergies as listed Nasal Prep:Lidocaine/Afrin   Procedure:     With the patient seated in the exam chair, the L nasal cavity was intubated with the flexible laryngoscope.  The nasal cavity mucosa, nasopharynx, hypopharynx and larynx were all examined with findings as noted in the body of today's office note.  The scope was then removed.  The patient tolerated the procedure well without complication or blood loss (unless indicated in findings).     . Signature  Electronically signed by : Izora Gala  M.D.; 11/26/2014 2:05 PM EST. Electronically signed by : Izora Gala  M.D.; 11/26/2014 3:28 PM EST.

## 2015-01-29 ENCOUNTER — Ambulatory Visit (HOSPITAL_COMMUNITY): Payer: Medicare Other | Admitting: Anesthesiology

## 2015-01-29 ENCOUNTER — Encounter (HOSPITAL_COMMUNITY): Payer: Self-pay | Admitting: *Deleted

## 2015-01-29 ENCOUNTER — Encounter (HOSPITAL_COMMUNITY)
Admission: RE | Disposition: A | Discharge status: Home / Self Care | Payer: Self-pay | Source: Ambulatory Visit | Attending: Otolaryngology

## 2015-01-29 ENCOUNTER — Ambulatory Visit (HOSPITAL_COMMUNITY)
Admission: RE | Admit: 2015-01-29 | Discharge: 2015-01-29 | Disposition: A | Discharge status: Home / Self Care | Payer: Medicare Other | Source: Ambulatory Visit | Attending: Otolaryngology | Admitting: Otolaryngology

## 2015-01-29 DIAGNOSIS — R490 Dysphonia: Secondary | ICD-10-CM | POA: Diagnosis not present

## 2015-01-29 DIAGNOSIS — C320 Malignant neoplasm of glottis: Secondary | ICD-10-CM | POA: Insufficient documentation

## 2015-01-29 DIAGNOSIS — Z7722 Contact with and (suspected) exposure to environmental tobacco smoke (acute) (chronic): Secondary | ICD-10-CM | POA: Insufficient documentation

## 2015-01-29 DIAGNOSIS — Z79899 Other long term (current) drug therapy: Secondary | ICD-10-CM | POA: Insufficient documentation

## 2015-01-29 DIAGNOSIS — Z9049 Acquired absence of other specified parts of digestive tract: Secondary | ICD-10-CM | POA: Insufficient documentation

## 2015-01-29 HISTORY — PX: MICROLARYNGOSCOPY: SHX5208

## 2015-01-29 LAB — CBC
HEMATOCRIT: 44.9 % (ref 39.0–52.0)
HEMOGLOBIN: 15.3 g/dL (ref 13.0–17.0)
MCH: 32.4 pg (ref 26.0–34.0)
MCHC: 34.1 g/dL (ref 30.0–36.0)
MCV: 95.1 fL (ref 78.0–100.0)
Platelets: 151 10*3/uL (ref 150–400)
RBC: 4.72 MIL/uL (ref 4.22–5.81)
RDW: 12.7 % (ref 11.5–15.5)
WBC: 8.5 10*3/uL (ref 4.0–10.5)

## 2015-01-29 LAB — BASIC METABOLIC PANEL
ANION GAP: 6 (ref 5–15)
BUN: 13 mg/dL (ref 6–20)
CALCIUM: 8.7 mg/dL — AB (ref 8.9–10.3)
CO2: 22 mmol/L (ref 22–32)
Chloride: 110 mmol/L (ref 101–111)
Creatinine, Ser: 1.23 mg/dL (ref 0.61–1.24)
GFR calc Af Amer: 59 mL/min — ABNORMAL LOW (ref >60)
GFR, EST NON AFRICAN AMERICAN: 51 mL/min — AB (ref >60)
Glucose, Bld: 106 mg/dL — ABNORMAL HIGH (ref 65–99)
POTASSIUM: 3.7 mmol/L (ref 3.5–5.1)
Sodium: 138 mmol/L (ref 135–145)

## 2015-01-29 SURGERY — MICROLARYNGOSCOPY
Anesthesia: General | Site: Throat | Laterality: Right

## 2015-01-29 MED ORDER — ARTIFICIAL TEARS OP OINT
TOPICAL_OINTMENT | OPHTHALMIC | Status: AC
  Filled 2015-01-29: qty 3.5

## 2015-01-29 MED ORDER — ARTIFICIAL TEARS OP OINT
TOPICAL_OINTMENT | OPHTHALMIC | Status: DC | PRN
  Administered 2015-01-29: 1 via OPHTHALMIC

## 2015-01-29 MED ORDER — ONDANSETRON HCL 4 MG/2ML IJ SOLN: 4.0000 mg | Freq: Once | INTRAMUSCULAR | Status: DC | PRN

## 2015-01-29 MED ORDER — EPINEPHRINE HCL (NASAL) 0.1 % NA SOLN
NASAL | Status: DC | PRN
  Administered 2015-01-29: 1 [drp] via NASAL

## 2015-01-29 MED ORDER — SUCCINYLCHOLINE CHLORIDE 20 MG/ML IJ SOLN
INTRAMUSCULAR | Status: DC | PRN
  Administered 2015-01-29: 40 mg via INTRAVENOUS
  Administered 2015-01-29: 60 mg via INTRAVENOUS

## 2015-01-29 MED ORDER — ONDANSETRON HCL 4 MG/2ML IJ SOLN
INTRAMUSCULAR | Status: AC
  Filled 2015-01-29: qty 2

## 2015-01-29 MED ORDER — LIDOCAINE HCL 4 % MT SOLN
OROMUCOSAL | Status: DC | PRN
  Administered 2015-01-29: 4 mL via TOPICAL

## 2015-01-29 MED ORDER — FENTANYL CITRATE (PF) 250 MCG/5ML IJ SOLN
INTRAMUSCULAR | Status: AC
  Filled 2015-01-29: qty 5

## 2015-01-29 MED ORDER — PROPOFOL 10 MG/ML IV BOLUS
INTRAVENOUS | Status: DC | PRN
  Administered 2015-01-29: 150 mg via INTRAVENOUS
  Administered 2015-01-29: 30 mg via INTRAVENOUS

## 2015-01-29 MED ORDER — 0.9 % SODIUM CHLORIDE (POUR BTL) OPTIME
TOPICAL | Status: DC | PRN
  Administered 2015-01-29: 1000 mL

## 2015-01-29 MED ORDER — PHENYLEPHRINE 40 MCG/ML (10ML) SYRINGE FOR IV PUSH (FOR BLOOD PRESSURE SUPPORT)
PREFILLED_SYRINGE | INTRAVENOUS | Status: AC
  Filled 2015-01-29: qty 10

## 2015-01-29 MED ORDER — LIDOCAINE HCL (CARDIAC) 20 MG/ML IV SOLN
INTRAVENOUS | Status: AC
  Filled 2015-01-29: qty 5

## 2015-01-29 MED ORDER — EPINEPHRINE HCL (NASAL) 0.1 % NA SOLN
NASAL | Status: AC
  Filled 2015-01-29: qty 30

## 2015-01-29 MED ORDER — LIDOCAINE HCL (CARDIAC) 20 MG/ML IV SOLN
INTRAVENOUS | Status: DC | PRN
  Administered 2015-01-29: 100 mg via INTRAVENOUS

## 2015-01-29 MED ORDER — PROPOFOL 10 MG/ML IV BOLUS
INTRAVENOUS | Status: AC
  Filled 2015-01-29: qty 20

## 2015-01-29 MED ORDER — LACTATED RINGERS IV SOLN
INTRAVENOUS | Status: DC
  Administered 2015-01-29: 08:00:00 via INTRAVENOUS

## 2015-01-29 MED ORDER — FENTANYL CITRATE (PF) 100 MCG/2ML IJ SOLN
INTRAMUSCULAR | Status: DC | PRN
  Administered 2015-01-29: 50 ug via INTRAVENOUS

## 2015-01-29 MED ORDER — ONDANSETRON HCL 4 MG/2ML IJ SOLN
INTRAMUSCULAR | Status: DC | PRN
  Administered 2015-01-29: 4 mg via INTRAVENOUS

## 2015-01-29 SURGICAL SUPPLY — 32 items
BANDAGE EYE OVAL (MISCELLANEOUS) ×3 IMPLANT
BLADE SURG 15 STRL LF DISP TIS (BLADE) IMPLANT
BLADE SURG 15 STRL SS (BLADE)
CANISTER SUCTION 2500CC (MISCELLANEOUS) ×3 IMPLANT
CONT SPEC 4OZ CLIKSEAL STRL BL (MISCELLANEOUS) ×3 IMPLANT
COVER MAYO STAND STRL (DRAPES) ×3 IMPLANT
COVER TABLE BACK 60X90 (DRAPES) ×3 IMPLANT
DRAPE PROXIMA HALF (DRAPES) ×3 IMPLANT
DRESSING TELFA 8X10 (GAUZE/BANDAGES/DRESSINGS) ×3 IMPLANT
ELECT REM PT RETURN 9FT ADLT (ELECTROSURGICAL)
ELECTRODE REM PT RTRN 9FT ADLT (ELECTROSURGICAL) IMPLANT
GAUZE SPONGE 4X4 16PLY XRAY LF (GAUZE/BANDAGES/DRESSINGS) ×3 IMPLANT
GLOVE BIO SURGEON STRL SZ8 (GLOVE) ×3 IMPLANT
GLOVE BIOGEL PI IND STRL 8 (GLOVE) ×1 IMPLANT
GLOVE BIOGEL PI INDICATOR 8 (GLOVE) ×2
GLOVE ECLIPSE 7.5 STRL STRAW (GLOVE) ×3 IMPLANT
GUARD TEETH (MISCELLANEOUS) IMPLANT
KIT BASIN OR (CUSTOM PROCEDURE TRAY) ×3 IMPLANT
KIT ROOM TURNOVER OR (KITS) ×3 IMPLANT
NEEDLE HYPO 25GX1X1/2 BEV (NEEDLE) IMPLANT
NS IRRIG 1000ML POUR BTL (IV SOLUTION) ×3 IMPLANT
PAD ARMBOARD 7.5X6 YLW CONV (MISCELLANEOUS) ×6 IMPLANT
PATTIES SURGICAL .5 X1 (DISPOSABLE) ×3 IMPLANT
SOLUTION ANTI FOG 6CC (MISCELLANEOUS) ×3 IMPLANT
SPECIMEN JAR SMALL (MISCELLANEOUS) ×3 IMPLANT
SYR 3ML LL SCALE MARK (SYRINGE) IMPLANT
SYR CONTROL 10ML LL (SYRINGE) IMPLANT
SYR TB 1ML LUER SLIP (SYRINGE) IMPLANT
TOWEL OR 17X24 6PK STRL BLUE (TOWEL DISPOSABLE) ×6 IMPLANT
TUBE CONNECTING 12'X1/4 (SUCTIONS) ×1
TUBE CONNECTING 12X1/4 (SUCTIONS) ×2 IMPLANT
WATER STERILE IRR 1000ML POUR (IV SOLUTION) ×3 IMPLANT

## 2015-01-29 NOTE — Anesthesia Preprocedure Evaluation (Addendum)
Anesthesia Evaluation  Patient identified by MRN, date of birth, ID band Patient awake    Reviewed: Allergy & Precautions, NPO status , Patient's Chart, lab work & pertinent test results  Airway Mallampati: I       Dental   Pulmonary    Pulmonary exam normal       Cardiovascular hypertension, + CAD and + Peripheral Vascular Disease Normal cardiovascular exam+ dysrhythmias + Valvular Problems/Murmurs     Neuro/Psych Depression    GI/Hepatic   Endo/Other    Renal/GU      Musculoskeletal   Abdominal   Peds  Hematology   Anesthesia Other Findings   Reproductive/Obstetrics                            Anesthesia Physical Anesthesia Plan  ASA: III  Anesthesia Plan: General   Post-op Pain Management:    Induction: Intravenous  Airway Management Planned:   Additional Equipment:   Intra-op Plan:   Post-operative Plan:   Informed Consent: I have reviewed the patients History and Physical, chart, labs and discussed the procedure including the risks, benefits and alternatives for the proposed anesthesia with the patient or authorized representative who has indicated his/her understanding and acceptance.     Plan Discussed with: CRNA, Anesthesiologist and Surgeon  Anesthesia Plan Comments:         Anesthesia Quick Evaluation

## 2015-01-29 NOTE — Interval H&P Note (Signed)
History and Physical Interval Note:  01/29/2015 9:37 AM  Timothy Suarez  has presented today for surgery, with the diagnosis of HOARSENESS  The various methods of treatment have been discussed with the patient and family. After consideration of risks, benefits and other options for treatment, the patient has consented to  Procedure(s): MICROLARYNGOSCOPY WITH BIOSPY OF RIGHT VOCAL CORD (Right) as a surgical intervention .  The patient's history has been reviewed, patient examined, no change in status, stable for surgery.  I have reviewed the patient's chart and labs.  Questions were answered to the patient's satisfaction.     Kynzley Dowson

## 2015-01-29 NOTE — Anesthesia Postprocedure Evaluation (Signed)
  Anesthesia Post-op Note  Patient: Timothy Suarez  Procedure(s) Performed: Procedure(s): MICROLARYNGOSCOPY WITH BIOSPY OF RIGHT VOCAL CORD (Right)  Patient Location: PACU  Anesthesia Type:General  Level of Consciousness: awake, alert , oriented and patient cooperative  Airway and Oxygen Therapy: Patient Spontanous Breathing  Post-op Pain: none  Post-op Assessment: Post-op Vital signs reviewed, Patient's Cardiovascular Status Stable, Respiratory Function Stable, Patent Airway, No signs of Nausea or vomiting and Pain level controlled              Post-op Vital Signs: stable  Last Vitals:  Filed Vitals:   01/29/15 1136  BP: 137/58  Pulse: 66  Temp:   Resp:     Complications: No apparent anesthesia complications

## 2015-01-29 NOTE — Anesthesia Procedure Notes (Signed)
Procedure Name: Intubation Date/Time: 01/29/2015 10:00 AM Performed by: Willeen Cass P Pre-anesthesia Checklist: Patient identified, Timeout performed, Emergency Drugs available, Suction available and Patient being monitored Patient Re-evaluated:Patient Re-evaluated prior to inductionOxygen Delivery Method: Circle system utilized Preoxygenation: Pre-oxygenation with 100% oxygen Intubation Type: IV induction Ventilation: Mask ventilation without difficulty Laryngoscope Size: Mac and 4 Grade View: Grade I Tube type: Oral Tube size: 6.5 mm Number of attempts: 1 Airway Equipment and Method: Stylet and LTA kit utilized Placement Confirmation: breath sounds checked- equal and bilateral and ETT inserted through vocal cords under direct vision Secured at: 22 cm Tube secured with: Tape Dental Injury: Teeth and Oropharynx as per pre-operative assessment

## 2015-01-29 NOTE — Telephone Encounter (Signed)
Expand All Collapse All   Pt notified of monitor results by phone with verbal understanding. Pt scheduled w/Dr. Radford Pax 8/29 1:30 per Brynda Rim. PA 2 month f/u. Pt has memory issues and asked for me to send him a note with all instrcutions and appt. Will send out short letter to pt.

## 2015-01-29 NOTE — Transfer of Care (Signed)
Immediate Anesthesia Transfer of Care Note  Patient: Timothy Suarez  Procedure(s) Performed: Procedure(s): MICROLARYNGOSCOPY WITH BIOSPY OF RIGHT VOCAL CORD (Right)  Patient Location: PACU  Anesthesia Type:General  Level of Consciousness: awake, alert , oriented and patient cooperative  Airway & Oxygen Therapy: Patient Spontanous Breathing and Patient connected to nasal cannula oxygen  Post-op Assessment: Report given to RN, Post -op Vital signs reviewed and stable and Patient moving all extremities X 4  Post vital signs: Reviewed and stable  Last Vitals:  BP 113/57 (70) HR 85 SpO2 98% on 2L Cornelius RR 18 Resting comfortably, denies pain.  Complications: No apparent anesthesia complications

## 2015-01-29 NOTE — Op Note (Signed)
OPERATIVE REPORT  DATE OF SURGERY: 01/29/2015  PATIENT:  Timothy Suarez,  81 y.o. male  PRE-OPERATIVE DIAGNOSIS:  HOARSENESS  POST-OPERATIVE DIAGNOSIS:  HOARSENESS  PROCEDURE:  Procedure(s): MICROLARYNGOSCOPY WITH BIOSPY OF RIGHT VOCAL CORD  SURGEON:  Beckie Salts, MD  ASSISTANTS: none  ANESTHESIA:   General   EBL:  3 ml  DRAINS: none  LOCAL MEDICATIONS USED:  None  SPECIMEN:  Right side larynx biopsy  COUNTS:  Correct  PROCEDURE DETAILS: The patient was taken to the operating room and placed on the operating table in the supine position. Following induction of general endotracheal anesthesia, the table was turned and the patient was prepped and draped in a standard fashion. A maxillary tooth protector was used throughout the case. A Jako laryngoscope was used to visualize the larynx and attached to the Mayo stand with the suspension apparatus. The microscope was brought into view. The larynx was inspected. A granular slightly raised lesion was identified on the right superior vocal fold with extension up into the floor of the ventricle. The area epiglottic fold and anterior commissure were not involved. The subglottis was not involved. Multiple biopsies were taken and sent for pathologic evaluation. Topical adrenaline was applied on pledgets for hemostasis. The scope was removed. The patient was awakened, extubated and transferred to recovery in stable condition.  PATIENT DISPOSITION:  To PACU, stable

## 2015-01-29 NOTE — Discharge Instructions (Signed)
You may use Tylenol or Motrin for discomfort. Resume diet and activities as tolerated.

## 2015-01-29 NOTE — H&P (View-Only) (Signed)
Assessment  Hoarseness (784.42) (R49.0). Discussed  3 month history of hoarseness, irregularity of the right vocal cord and ventricle. Recommend microlaryngoscopy with biopsy. He will need cardiac clearance first. Reason For Visit  Hoarseness. HPI  2 or 3 month history of hoarseness and weakness of the voice. He denies any other upper respiratory symptoms, sore throat, trouble swallowing, fever or chills. He is having no trouble drinking liquids. He is not losing weight. Allergies  No Known Drug Allergies. Current Meds  Ambien TABS (Zolpidem Tartrate);; RPT Norvasc TABS (AmLODIPine Besylate);; RPT Lipitor TABS (Atorvastatin Calcium);; RPT Travatan SOLN;; RPT Atorvastatin Calcium 40 MG Oral Tablet;; RPT. PMH  History of colectomy (V45.89) (Z90.49) History of mitral valve repair (V15.1) (Z98.89). PSH  Knee Surgery Tympanoplasty. Family Hx  Family history of cancer: Mother (V16.9) (Z80.9). Personal Hx  No caffeine use Non-smoker (V49.89) (Z78.9) Secondhand smoke exposure (V15.89) (Z77.22) Social alcohol use (Z78.9). ROS  Systemic: Not feeling tired (fatigue).  No fever, no night sweats, and no recent weight loss. Head: No headache. Eyes: No eye symptoms. Otolaryngeal: No hearing loss, no earache, no tinnitus, and no purulent nasal discharge.  No nasal passage blockage (stuffiness), no snoring, no sneezing, no hoarseness, and no sore throat. Cardiovascular: No chest pain or discomfort  and no palpitations. Pulmonary: No dyspnea, no cough, and no wheezing. Gastrointestinal: No dysphagia  and no heartburn.  No nausea, no abdominal pain, and no melena.  No diarrhea. Genitourinary: No dysuria. Endocrine: No muscle weakness. Musculoskeletal: No calf muscle cramps, no arthralgias, and no soft tissue swelling. Neurological: No dizziness, no fainting, no tingling, and no numbness. Psychological: No anxiety  and no depression. Skin: No rash. 12 system ROS was obtained and reviewed on  the Health Maintenance form dated today.  Positive responses are shown above.  If the symptom is not checked, the patient has denied it. Physical Exam  APPEARANCE: Well developed, well nourished, in no acute distress.  Normal affect, in a pleasant mood.  Oriented to time, place and person. COMMUNICATION: Weak and hoarse voice   HEAD & FACE:  No scars, lesions or masses of head and face.  Sinuses nontender to palpation.  Salivary glands without mass or tenderness.  Facial strength symmetric.  No facial lesion, scars, or mass. EYES: EOMI with normal primary gaze alignment. Visual acuity grossly intact.  PERRLA EXTERNAL EAR & NOSE: No scars, lesions or masses  EAC & TYMPANIC MEMBRANE:  EAC shows no obstructing lesions or debris and tympanic membranes are normal bilaterally with good movement to insufflation. GROSS HEARING: Diminished. He has bilateral hearing aids. TMJ:  Nontender  INTRANASAL EXAM: No polyps or purulence.  NASOPHARYNX: Normal, without lesions. LIPS, TEETH & GUMS: No lip lesions, normal dentition and normal gums. ORAL CAVITY/OROPHARYNX:  Oral mucosa moist without lesion or asymmetry of the palate, tongue, tonsil or posterior pharynx. NECK:  Supple without adenopathy or mass. THYROID:  Normal with no masses palpable.  NEUROLOGIC:  No gross CN deficits. No nystagmus noted.   LYMPHATIC:  No enlarged nodes palpable.   Flexible examination reveals mobile cords, with an irregularity of the right anterior vocal cord and fullness of the ventricle. Procedure  Fiberoptic Laryngoscopy Name: Haidan Nhan     Age: 81 year     The risks and benefits of this procedure have been thoroughly discussed with the patient/parent.  The most commons risks outlined included but were not limited to: injury  to the nasal mucosa or throat irritation.  The patient/parent was further informed that  there are other less common risks.  The patient/parent was given the opportunity to ask questions and all  such questions were answered to the patient/parent's satisfaction.  Patient/parent acknowledged the risks and has agreed to proceed.   Performing Provider: Izora Gala The risks of the procedure are minimal and were discussed with the patient today. Pre-op Diagnosis: hoarseness  Post-op Diagnosis:Same Allergy:  reviewed allergies as listed Nasal Prep:Lidocaine/Afrin   Procedure:     With the patient seated in the exam chair, the L nasal cavity was intubated with the flexible laryngoscope.  The nasal cavity mucosa, nasopharynx, hypopharynx and larynx were all examined with findings as noted in the body of today's office note.  The scope was then removed.  The patient tolerated the procedure well without complication or blood loss (unless indicated in findings).     . Signature  Electronically signed by : Izora Gala  M.D.; 11/26/2014 2:05 PM EST. Electronically signed by : Izora Gala  M.D.; 11/26/2014 3:28 PM EST.

## 2015-01-30 ENCOUNTER — Encounter: Payer: Self-pay | Admitting: Internal Medicine

## 2015-01-30 ENCOUNTER — Telehealth: Payer: Self-pay | Admitting: Cardiology

## 2015-01-30 ENCOUNTER — Encounter (HOSPITAL_COMMUNITY): Payer: Self-pay | Admitting: Otolaryngology

## 2015-01-30 DIAGNOSIS — C320 Malignant neoplasm of glottis: Secondary | ICD-10-CM | POA: Insufficient documentation

## 2015-01-30 NOTE — Telephone Encounter (Signed)
-----   Message from Sueanne Margarita, MD sent at 01/26/2015  8:42 PM EDT ----- Please let patient know that echo showed NSR with extra heart beats from the top and bottom of his heart which are benign.  Toprol XL 12.5mg  daily started by Richardson Dopp, PA.  Would have patient stop amlodipine.  Please find out if he is still having dizziness or having any palpitation.

## 2015-01-30 NOTE — Telephone Encounter (Signed)
Informed patient's daughter that DPR is not signed. She st she will be with her father this afternoon and to call then.  Will call back later today.

## 2015-01-30 NOTE — Telephone Encounter (Signed)
Spoke to patient's daughter with patient's consent. Informed patient's daughter of results and verbal understanding expressed.   Patient has started Wellbutrin and is head is much more clear. (Patient's daughter st she thinks her father was calling his confusion "dizziness.") He does not complain of palpitations. Instructed patient's daughter to have him stop amlodipine.  They both agree with treatment plan.

## 2015-01-30 NOTE — Telephone Encounter (Signed)
New message     Patient daughter calling back to speak with nurse from yesterday

## 2015-02-07 ENCOUNTER — Telehealth: Payer: Self-pay | Admitting: *Deleted

## 2015-02-07 NOTE — Telephone Encounter (Signed)
Oncology Nurse Navigator Documentation  Oncology Nurse Navigator Flowsheets 02/07/2015  Referral date to RadOnc/MedOnc 02/04/2015  Navigator Encounter Type Introductory call  Placed introductory call to new referral patient and subsequently to his dtr. 1. Introduced myself as the oncology nurse navigator that works with Dr. Tammi Klippel with whom he has an appt next week Thursday 02/14/15 at 2:30. 2. He confirmed understanding of his referral to [facility] by Dr. Constance Holster.  3. I briefly explained my role as a navigator, indicated that I would be joining them during his appt next week. 4. I confirmed their understanding of the [facility] location, explained arrival and RadOnc registration process for appt. 5. I provided my contact information, encouraged them to call with questions/concerns before next week. 6. They verbalized understanding of information provided, expressed appreciation for my call.    Time Spent with Patient [place], RN, BSN, [place] at [place] [phone]

## 2015-02-12 ENCOUNTER — Other Ambulatory Visit: Payer: Self-pay | Admitting: Internal Medicine

## 2015-02-13 ENCOUNTER — Encounter: Payer: Self-pay | Admitting: Radiation Oncology

## 2015-02-13 NOTE — Progress Notes (Signed)
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  Presented to Dr. Constance Holster on 01/28/2015 with a 3 month history of hoarseness, poor appetite, fatigue, dizziness, depression, cough and memory loss.   In addition, patient was seen by Dr. Karsten Ro in 2007 for an elevated PSA but, Dr. Hollace Kinnier referred the patient back March 2016 frequency, urgency, nocturia x3 and PSA of 21.7.       Dr. Janice Norrie discussed active surveillance, IMRT vs. Early or late androgen deprivation but, patient wishes to wait until a decision is made about treatment for his voice/vocal cord.  HX of radiation therapy: Pacemaker: NO  81 year old male. Retired. Widower with two sons.

## 2015-02-14 ENCOUNTER — Encounter: Payer: Self-pay | Admitting: Radiation Oncology

## 2015-02-14 ENCOUNTER — Encounter: Payer: Self-pay | Admitting: *Deleted

## 2015-02-14 ENCOUNTER — Ambulatory Visit
Admission: RE | Admit: 2015-02-14 | Discharge: 2015-02-14 | Disposition: A | Discharge status: Home / Self Care | Payer: Medicare Other | Source: Ambulatory Visit | Attending: Radiation Oncology | Admitting: Radiation Oncology

## 2015-02-14 VITALS — BP 135/91 | HR 67 | Resp 16 | Ht 72.0 in | Wt 211.9 lb

## 2015-02-14 DIAGNOSIS — H409 Unspecified glaucoma: Secondary | ICD-10-CM | POA: Diagnosis not present

## 2015-02-14 DIAGNOSIS — I10 Essential (primary) hypertension: Secondary | ICD-10-CM | POA: Insufficient documentation

## 2015-02-14 DIAGNOSIS — Z952 Presence of prosthetic heart valve: Secondary | ICD-10-CM | POA: Diagnosis not present

## 2015-02-14 DIAGNOSIS — Z809 Family history of malignant neoplasm, unspecified: Secondary | ICD-10-CM | POA: Diagnosis not present

## 2015-02-14 DIAGNOSIS — G3184 Mild cognitive impairment, so stated: Secondary | ICD-10-CM | POA: Diagnosis not present

## 2015-02-14 DIAGNOSIS — E785 Hyperlipidemia, unspecified: Secondary | ICD-10-CM | POA: Insufficient documentation

## 2015-02-14 DIAGNOSIS — Z510 Encounter for antineoplastic radiation therapy: Secondary | ICD-10-CM | POA: Diagnosis present

## 2015-02-14 DIAGNOSIS — F329 Major depressive disorder, single episode, unspecified: Secondary | ICD-10-CM | POA: Diagnosis not present

## 2015-02-14 DIAGNOSIS — Z85038 Personal history of other malignant neoplasm of large intestine: Secondary | ICD-10-CM | POA: Diagnosis not present

## 2015-02-14 DIAGNOSIS — C61 Malignant neoplasm of prostate: Secondary | ICD-10-CM

## 2015-02-14 DIAGNOSIS — Z79899 Other long term (current) drug therapy: Secondary | ICD-10-CM | POA: Diagnosis not present

## 2015-02-14 DIAGNOSIS — C320 Malignant neoplasm of glottis: Secondary | ICD-10-CM

## 2015-02-14 DIAGNOSIS — I2510 Atherosclerotic heart disease of native coronary artery without angina pectoris: Secondary | ICD-10-CM | POA: Diagnosis not present

## 2015-02-14 DIAGNOSIS — Z7982 Long term (current) use of aspirin: Secondary | ICD-10-CM | POA: Diagnosis not present

## 2015-02-14 DIAGNOSIS — I480 Paroxysmal atrial fibrillation: Secondary | ICD-10-CM | POA: Insufficient documentation

## 2015-02-14 MED ORDER — LARYNGOSCOPY SOLUTION RAD-ONC
15.0000 mL | Freq: Once | TOPICAL | Status: AC
  Administered 2015-02-14: 15 mL via TOPICAL
  Filled 2015-02-14: qty 15

## 2015-02-14 NOTE — Progress Notes (Signed)
See progress note under physician encounter. 

## 2015-02-14 NOTE — Progress Notes (Signed)
Correction patient is widowed with two daughters. Patient here today for consultation with Dr. Tammi Klippel accompanied by his daughter. Patient HOH. Hearing aid noted left ear. Denies difficulty swallowing. Denies reflux. Reports intermittent hoarseness x several month initially treated as reflux. Patient denies having dentures. Patient denies hx of radiation therapy. Hoarseness, dizziness, fatigue, and poor appetite continue. Resides at [facility]. Drives himself. Patient prefers to focus on treating the cancer of his vocal cords first then, his prostate.

## 2015-02-14 NOTE — Progress Notes (Addendum)
Radiation Oncology         [phone] ________________________________  Initial Outpatient Consultation  Name: Timothy Suarez MRN: [id number]  Date: 02/14/2015  DOB: 09-27-1924  JJ:HERD, TIFFANY, DO  Izora Gala, MD   REFERRING PHYSICIAN: Izora Gala, MD  DIAGNOSIS: 81 y.o. 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    ICD-9-CM ICD-10-CM   1. Squamous cell carcinoma of right vocal cord 161.0 C32.0   2. Prostate cancer 185 C61   3. Stage T1a Squamous Cell Carcinoma of the Right True Vocal Cord 161.0 C32.0 laryngocopy solution for Rad-Onc     Fiberoptic laryngoscopy    HISTORY OF PRESENT ILLNESS:Timothy Suarez is a 81 y.o. gentleman.  He was noted to have an elevated PSA of 21.7 by his primary care physician, Dr. Mariea Clonts on 09/27/2014.  Accordingly, he was referred for evaluation in urology by Dr. Janice Norrie on 10/24/2014,  digital rectal examination was performed at that time revealing induration of the prostate.  The patient proceeded to transrectal ultrasound with 12 biopsies of the prostate on 12/06/2014. Out of 12 core biopsies,10 were positive.  The maximum Gleason score was 4+3, and this was seen in the distrubution shown below.              The patient reviewed the biopsy results with his urologist. Due to the patient's markedly elevated PSA, he falls into a high risk category. So, bone scan and pelvic CT were performed for staging purposes on 12/25/2014 and 01/16/2015, respectively. The bone scan showed no obvious metastasis disease, only degenerative change at L2. Pelvic CT also showed no metastasis. Pelvic CT confirmed chronic degenerative spine disease.  During this same time frame, the patient noted a 3 month history of worsening hoarseness. The patient and his daughter reported that his hoarseness has "gradually" grown worse over  the past three to four months.  He was seen by Dr. Constance Holster and noted to have an irregularity of the right true vocal cord and ventricle. Accordingly, on 01/29/2015, the patient went to micro-laryngoscopy with biospy of the right true vocal cord. Dr. Constance Holster described seeing a granular slightly raises lesion on the right superior vocal fold with extension up into the ventricle. Bx confirmed squamous cell carcinoma. He presents today having been scheduled by Dr. Constance Holster to discuss radiation treatment options for his larynx cancer.  PREVIOUS RADIATION THERAPY: No  PAST MEDICAL HISTORY:  has a past medical history of MVP (mitral valve prolapse); Paroxysmal atrial fibrillation; HTN (hypertension); Depression; Hernia; Aortic valve insufficiency, acquired; Dyslipidemia; Glaucoma; BPH (benign prostatic hyperplasia); Colon cancer; Heart valve replaced; A-fib; Cerebral ischemia; Sleep disturbance; Mild cognitive impairment; Thoracic aortic aneurysm; Major depression; Hyperlipidemia; History of echocardiogram; cardiovascular stress test; Coronary artery disease; Prostate cancer; and Cancer.    PAST SURGICAL HISTORY: Past Surgical History  Procedure Laterality Date  . Flexhd patch repair of chest wall hernia.  01/28/11    [name]  . Colectomy  2004    Dr [name]  . Tympanoplasty    . Mitral valve repair  12/27/2008    complex valvuloplasty with 69mm Memo 3D annuloplasty ring via right minithoracotomy  . Maze  12/27/2008    left side lesion set  . Mitral valve repair      minimally invasive  . Tee without cardioversion N/A 01/03/2013    Procedure: TRANSESOPHAGEAL ECHOCARDIOGRAM (TEE);  Surgeon: Sueanne Margarita, MD;  Location: [facility];  Service: Cardiovascular;  Laterality: N/A;  . Total knee arthroplasty Right 2003  . Microlaryngoscopy Right 01/29/2015    Procedure: MICROLARYNGOSCOPY WITH BIOSPY OF RIGHT VOCAL CORD;  Surgeon: Izora Gala, MD;  Location: [facility];  Service: ENT;  Laterality: Right;  . Prostate biopsy       FAMILY HISTORY: family history includes Cancer in his father and mother. There is no history of Heart attack or Stroke.  SOCIAL HISTORY:  reports that he has never smoked. He has never used smokeless tobacco. He reports that he drinks alcohol. He reports that he does not use illicit drugs.  ALLERGIES: Citalopram and Trazodone and nefazodone  MEDICATIONS:  Current Outpatient Prescriptions  Medication Sig Dispense Refill  . aspirin EC 81 MG tablet Take 1 tablet (81 mg total) by mouth daily. 30 tablet 3  . atorvastatin (LIPITOR) 40 MG tablet Take 1 tablet (40 mg total) by mouth daily at 6 PM. For cholesterol 90 tablet 3  . buPROPion (WELLBUTRIN XL) 150 MG 24 hr tablet Take 1 tablet (150 mg total) by mouth daily. 30 tablet 5  . Cholecalciferol (VITAMIN D) 2000 UNITS CAPS Take 1 capsule (2,000 Units total) by mouth daily. 30 capsule 3  . metoprolol succinate (TOPROL-XL) 25 MG 24 hr tablet Take 0.5 tablets (12.5 mg total) by mouth daily. 30 tablet 11  . travoprost, benzalkonium, (TRAVATAN) 0.004 % ophthalmic solution Place 1 drop into both eyes at bedtime.      Marland Kitchen zolpidem (AMBIEN) 5 MG tablet TAKE 1 TABLET BY MOUTH EVERY NIGHT AT BEDTIME AS NEEDED FOR SLEEP 30 tablet 0   No current facility-administered medications for this encounter.    REVIEW OF SYSTEMS:  A 15 point review of systems is documented in the electronic medical record. This was obtained by the nursing staff. However, I reviewed this with the patient to discuss relevant findings and make appropriate changes. Pertinent items are noted in HPI. The patient completed an IPSS and IIEF questionnaire.   PHYSICAL EXAM: This patient is in no acute distress.  He is alert and oriented.   height is 6' (1.829 m) and weight is 211 lb 14.4 oz (96.117 kg). His blood pressure is 135/91 and his pulse is 67. His respiration is 16 and oxygen saturation is 100%.  He exhibits no respiratory distress or labored breathing.  He appears neurologically  intact.  His mood is pleasant.  His affect is appropriate.  Please note the digital rectal exam findings described above.  PROCEDURE NOTE: After anesthetizing the nasal cavity with topical lidocaine and phenylephrine, the flexible endoscope was introduced and passed through the nasal cavity.  The nasopharynx and posterior oropharynx were clear with normal anatomy. The patient's left true vocal cord could be easily visualized showing smooth mucosa. The right true vocal cord was obscured by an irregular mass confined to the vocal cord with less than 5 mm extension superiorly into the ventricle.  KPS = 90  100 - Normal; no complaints; no evidence of disease. 90   - Able to carry on normal activity; minor signs or symptoms of disease. 80   - Normal activity with effort; some signs or symptoms of disease. 88   - Cares for self; unable to carry on normal activity or to do active work. 60   - Requires occasional assistance, but is able to care for most of his personal needs. 50   - Requires considerable assistance and frequent medical care.  42   - Disabled; requires special care and assistance. 60   - Severely disabled; hospital admission is indicated although death not imminent. 34   - Very sick; hospital admission necessary; active supportive treatment necessary. 10   - Moribund; fatal processes progressing rapidly. 0     - Dead  Karnofsky DA, Abelmann [place], Craver LS and Burchenal [facility] [phone]) The use of the nitrogen mustards in the palliative treatment of carcinoma: with particular reference to bronchogenic carcinoma Cancer 1 634-56   LABORATORY DATA:  Lab Results  Component Value Date   WBC 8.5 01/29/2015   HGB 15.3 01/29/2015   HCT 44.9 01/29/2015   MCV 95.1 01/29/2015   PLT 151 01/29/2015   Lab Results  Component Value Date   NA 138 01/29/2015   K 3.7 01/29/2015   CL 110 01/29/2015   CO2 22 01/29/2015   Lab Results  Component Value Date   ALT 12 04/02/2014   AST 13 04/02/2014    ALKPHOS 107 04/02/2014   BILITOT 0.4 04/02/2014     RADIOGRAPHY: I personally reviewed the bone scan and CT pelvis.    IMPRESSION: Timothy Suarez is a 81 y.o. 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 Accordingly he is eligible for a variety of potential treatment options including radiation treatment and hormone therapy. Active surveillance is not recommended given his markedly elevated PSA.  PLAN: Today I reviewed the findings and workup thus far.  We discussed the natural history of prostate cancer.  We reviewed the the implications of T-stage, Gleason's Score, and PSA on decision-making and outcomes in prostate cancer.  We discussed radiation treatment in the management of prostate cancer with regard to the logistics and delivery of external beam radiation treatment as well as the logistics and delivery of prostate brachytherapy.  We compared and contrasted each of these approaches and also compared these against prostatectomy.  The patient expressed interest in external beam radiotherapy.  I filled out a patient counseling form for him with relevant treatment diagrams and we retained a copy for our records.   Since the patient falls into a high risk category, he'll benefit from 2-3 years of hormone therapy.  The patient would like to proceed with prostate IMRT.  I will share my findings with Dr. Janice Norrie and move forward with scheduling initiation of hormone therapy now for 2-4 months prior to IMRT.  When he elects to proceed with IMRT, we will discuss placement of three gold fiducial markers into the prostate to proceed with IMRT following at least two months of neoadjuvant hormone therapy.    In the interm, the patient will require treatment for his early stage larynx cancer, This patient has what appears to be clinical T1aN0 glottic cancer.  Statistically,  the risk of nodal or distant metastases is very low. I spoke to the patient about this and told him that he could undergo further staging scans but I did not think this was imperative. He is comfortable with deferring any further staging which I think is appropriate.  For his early stage glottic cancer, I recommend external beam radiotherapy to a dose of 63 Gy in 28 fractions at 2.25 Gy per fraction. I would treat with opposed lateral beams, extending from the thyroid notch superiorly to the bottom of the cricoid inferiorly,  from the anterior VB to 1cm flash  beyond the skin. I will make sure that the dose the anterior commissure it is adequate.  We discussed the risks benefits and side effects of laryngeal radiotherapy which include but are not necessarily limited to fatigue, skin irritation and esophagitis in the acute setting. He understands that late effects may include rare damage to the internal tissues including the cartilage, larynx, and esophagus. The patient has been advised that we will simulate him on 02/15/2015, anticipating starting treatment a week thereafter. The patient was made aware of his appointment to be scheduled with Dr. Janice Norrie for hormone therapy, a planning session, CT scan, and radiation treatments to be scheduled in October or November of 2016. The patient and his family were also educated on methods and medications to help alleviate possible future symptoms that may occur during treatment.  I enjoyed meeting with him today, and will look forward to participating in the care of this very nice gentleman. If Mr. Steinborn or his family develop any further questions or concerns in regards to his treatment, they have been encouraged to contact me  I spent 60 minutes face to face with the patient and more than 50% of that time was spent in counseling and/or coordination of care.   This document serves as a record of services personally performed by Tyler Pita, MD. It was created  on his behalf by Lenn Cal, a trained medical scribe. The creation of this record is based on the scribe's personal observations and the provider's statements to them. This document has been checked and approved by the attending provider.   ____________________________________________________________________  Sheral Apley. Tammi Klippel, M.D.

## 2015-02-15 ENCOUNTER — Ambulatory Visit
Admission: RE | Admit: 2015-02-15 | Discharge: 2015-02-15 | Disposition: A | Discharge status: Home / Self Care | Payer: Medicare Other | Source: Ambulatory Visit | Attending: Radiation Oncology | Admitting: Radiation Oncology

## 2015-02-15 ENCOUNTER — Encounter: Payer: Self-pay | Admitting: *Deleted

## 2015-02-15 DIAGNOSIS — Z510 Encounter for antineoplastic radiation therapy: Secondary | ICD-10-CM | POA: Diagnosis not present

## 2015-02-15 DIAGNOSIS — C320 Malignant neoplasm of glottis: Secondary | ICD-10-CM

## 2015-02-15 NOTE — Progress Notes (Signed)
  Radiation Oncology         [phone] ________________________________  Name: Timothy Suarez MRN: [id number]  Date: 02/15/2015  DOB: May 23, 1925  SIMULATION AND TREATMENT PLANNING NOTE    ICD-9-CM ICD-10-CM   1. Stage T1a Squamous Cell Carcinoma of the Right True Vocal Cord 161.0 C32.0     DIAGNOSIS:  81 y.o. 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  NARRATIVE:  The patient was brought to the [place].  Identity was confirmed.  All relevant records and images related to the planned course of therapy were reviewed.  The patient freely provided informed written consent to proceed with treatment after reviewing the details related to the planned course of therapy. The consent form was witnessed and verified by the simulation staff.  Then, the patient was set-up in a stable reproducible  supine position for radiation therapy.  CT images were obtained.  Surface markings were placed.  The CT images were loaded into the planning software.  Then the target and avoidance structures were contoured.  Treatment planning then occurred.  The radiation prescription was entered and confirmed.  Then, I designed and supervised the construction of a total of 3 medically necessary complex treatment devices, including a thermoplastic mask for immobilization and 2 MLC aperatures to shield the spinal cord.  I have requested : 3D Simulation.  I have requested a DVH of the following structures: larynx, esophagus, spinal cord, and skin.  I have ordered:Nutrition Consult.  PLAN:  The patient will receive 63 Gy in 28 fractions.  This document serves as a record of services personally performed by Tyler Pita, MD. It was created on his behalf by Darcus Austin, a trained medical scribe. The creation of this record is based on the scribe's personal observations and the provider's statements to them. This document has been checked and approved by the attending  provider.     ________________________________  Sheral Apley. Tammi Klippel, M.D.

## 2015-02-16 NOTE — Progress Notes (Signed)
  Oncology Nurse Navigator Documentation   Navigator Encounter Type: Other (02/15/15 1000)      To provide support and encouragement, care continuity, met with patient during initiation of CT SIM.  He was accompanied by his dtr.    Gayleen Orem, RN, BSN, [place] at [place] [phone]

## 2015-02-16 NOTE — Progress Notes (Signed)
  Oncology Nurse Navigator Documentation   Navigator Encounter Type: Initial RadOnc (02/14/15 1415)     Met with patient during initial consult with Dr. Tammi Klippel.  He was accompanied by his dtr Debbie.  Patient lives at [facility], independent living. 1. I further introduced myself as his Navigator, explained my role as a member of the Care Team. 2. I provided New Patient Information packet:  Contact information for physician and navigator  Advance Directive information ([place] blue pamphlet).  He noted he as these documents, will bring copy for our files.  Fall Prevention Patient Safety Plan  Appointment Guideline  ACS referral form.  [place] with [facility] [place] 3. Provided introductory explanation of radiation treatment including SIM planning and fitting of head mask, showed them example of mask.   4. Provided a tour of SIM and Tomo areas, explained treatment and arrival procedures.   5. Patient is to proceed with RT for laryngeal.  Hormone therapy will be initiated for prostate cancer with RT in late fall.  6. He is to have [facility] tomorrow. They verbalized understanding of information provided.   They understand I can be contacted with questions/concerns.     Gayleen Orem, RN, BSN, [place] at [place] [phone]

## 2015-02-20 ENCOUNTER — Ambulatory Visit: Payer: Medicare Other

## 2015-02-20 ENCOUNTER — Ambulatory Visit: Payer: Medicare Other | Admitting: Radiation Oncology

## 2015-02-21 DIAGNOSIS — Z510 Encounter for antineoplastic radiation therapy: Secondary | ICD-10-CM | POA: Diagnosis not present

## 2015-02-22 DIAGNOSIS — Z510 Encounter for antineoplastic radiation therapy: Secondary | ICD-10-CM | POA: Diagnosis not present

## 2015-02-25 ENCOUNTER — Ambulatory Visit
Admission: RE | Admit: 2015-02-25 | Discharge: 2015-02-25 | Disposition: A | Discharge status: Home / Self Care | Payer: Medicare Other | Source: Ambulatory Visit | Attending: Radiation Oncology | Admitting: Radiation Oncology

## 2015-02-25 DIAGNOSIS — Z510 Encounter for antineoplastic radiation therapy: Secondary | ICD-10-CM | POA: Diagnosis not present

## 2015-02-26 ENCOUNTER — Ambulatory Visit
Admission: RE | Admit: 2015-02-26 | Discharge: 2015-02-26 | Disposition: A | Discharge status: Home / Self Care | Payer: Medicare Other | Source: Ambulatory Visit | Attending: Radiation Oncology | Admitting: Radiation Oncology

## 2015-02-26 DIAGNOSIS — Z510 Encounter for antineoplastic radiation therapy: Secondary | ICD-10-CM | POA: Diagnosis not present

## 2015-02-27 ENCOUNTER — Ambulatory Visit
Admission: RE | Admit: 2015-02-27 | Discharge: 2015-02-27 | Disposition: A | Discharge status: Home / Self Care | Payer: Medicare Other | Source: Ambulatory Visit | Attending: Radiation Oncology | Admitting: Radiation Oncology

## 2015-02-27 DIAGNOSIS — Z510 Encounter for antineoplastic radiation therapy: Secondary | ICD-10-CM | POA: Diagnosis not present

## 2015-02-28 ENCOUNTER — Ambulatory Visit
Admission: RE | Admit: 2015-02-28 | Discharge: 2015-02-28 | Disposition: A | Discharge status: Home / Self Care | Payer: Medicare Other | Source: Ambulatory Visit | Attending: Radiation Oncology | Admitting: Radiation Oncology

## 2015-02-28 DIAGNOSIS — Z510 Encounter for antineoplastic radiation therapy: Secondary | ICD-10-CM | POA: Diagnosis not present

## 2015-03-01 ENCOUNTER — Encounter: Payer: Self-pay | Admitting: Radiation Oncology

## 2015-03-01 ENCOUNTER — Ambulatory Visit
Admission: RE | Admit: 2015-03-01 | Discharge: 2015-03-01 | Disposition: A | Discharge status: Home / Self Care | Payer: Medicare Other | Source: Ambulatory Visit | Attending: Radiation Oncology | Admitting: Radiation Oncology

## 2015-03-01 VITALS — BP 180/74 | HR 59 | Resp 16 | Wt 211.4 lb

## 2015-03-01 DIAGNOSIS — C320 Malignant neoplasm of glottis: Secondary | ICD-10-CM | POA: Insufficient documentation

## 2015-03-01 DIAGNOSIS — Z510 Encounter for antineoplastic radiation therapy: Secondary | ICD-10-CM | POA: Diagnosis not present

## 2015-03-01 MED ORDER — BIAFINE EX EMUL
Freq: Every day | CUTANEOUS | Status: DC
  Administered 2015-03-01: 11:00:00 via TOPICAL

## 2015-03-01 NOTE — Progress Notes (Signed)
  Radiation Oncology         [phone]   Name: Timothy Suarez MRN: [id number]   Date: 03/01/2015  DOB: [birth year]     Weekly Radiation Therapy Management    ICD-9-CM ICD-10-CM   1. 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 Weight stable. BP elevated. Denies pain. Hoarseness continues. Dry clearing cough continues. Denies difficulty swallowing. Reports dizziness continues since prior to starting radiation therapy. No skin changes noted anterior neck. Given biafine cream. The patient is without complaint. Set-up films were reviewed. The chart was checked.  Physical Findings  weight is 211 lb 6.4 oz (95.89 kg). His blood pressure is 180/74 and his pulse is 59. His respiration is 16. . Weight essentially stable.  No significant changes.  Impression The patient is tolerating radiation.  Plan Continue treatment as planned.   This document serves as a record of services personally performed by Timothy Pita, MD. It was created on his behalf by Timothy Suarez, a trained medical scribe. The creation of this record is based on the scribe's personal observations and the Timothy Suarez's statements to them. This document has been checked and approved by the attending Timothy Suarez.      Timothy Suarez, M.D.

## 2015-03-01 NOTE — Progress Notes (Addendum)
Weight stable. BP elevated. Denies pain. Hoarseness continues. Dry clearing cough continues. Denies difficulty swallowing. Reports dizziness continues since prior to starting radiation therapy. No skin changes noted anterior neck.  BP 180/74 mmHg  Pulse 59  Resp 16  Wt 211 lb 6.4 oz (95.89 kg) Wt Readings from Last 3 Encounters:  03/01/15 211 lb 6.4 oz (95.89 kg)  02/14/15 211 lb 14.4 oz (96.117 kg)  01/29/15 208 lb (94.348 kg)   Patient is HOH and ambulates with the aid of a cane. Oriented patient to staff and routine of the clinic. Provided patient with RADIATION THERAPY AND YOU handbook then, reviewed pertinent information. Educated patient reference potential side effects and management such as, fatigue, skin changes, and throat changes. Provided patient with Biafine and directed upon use. Encouraged patient to contact this RN with future needs and provided him with my business card. Patient verbalized understanding of all reviewed.

## 2015-03-04 ENCOUNTER — Ambulatory Visit
Admission: RE | Admit: 2015-03-04 | Discharge: 2015-03-04 | Disposition: A | Discharge status: Home / Self Care | Payer: Medicare Other | Source: Ambulatory Visit | Attending: Radiation Oncology | Admitting: Radiation Oncology

## 2015-03-04 DIAGNOSIS — Z510 Encounter for antineoplastic radiation therapy: Secondary | ICD-10-CM | POA: Diagnosis not present

## 2015-03-05 ENCOUNTER — Ambulatory Visit
Admission: RE | Admit: 2015-03-05 | Discharge: 2015-03-05 | Disposition: A | Discharge status: Home / Self Care | Payer: Medicare Other | Source: Ambulatory Visit | Attending: Radiation Oncology | Admitting: Radiation Oncology

## 2015-03-05 DIAGNOSIS — Z510 Encounter for antineoplastic radiation therapy: Secondary | ICD-10-CM | POA: Diagnosis not present

## 2015-03-06 ENCOUNTER — Ambulatory Visit: Payer: Medicare Other

## 2015-03-06 ENCOUNTER — Ambulatory Visit
Admission: RE | Admit: 2015-03-06 | Discharge: 2015-03-06 | Disposition: A | Discharge status: Home / Self Care | Payer: Medicare Other | Source: Ambulatory Visit | Attending: Radiation Oncology | Admitting: Radiation Oncology

## 2015-03-07 ENCOUNTER — Encounter: Payer: Self-pay | Admitting: Radiation Oncology

## 2015-03-07 ENCOUNTER — Ambulatory Visit
Admission: RE | Admit: 2015-03-07 | Discharge: 2015-03-07 | Disposition: A | Discharge status: Home / Self Care | Payer: Medicare Other | Source: Ambulatory Visit | Attending: Radiation Oncology | Admitting: Radiation Oncology

## 2015-03-07 ENCOUNTER — Encounter: Payer: Self-pay | Admitting: *Deleted

## 2015-03-07 VITALS — BP 151/63 | HR 63 | Temp 97.8°F | Resp 20 | Wt 212.1 lb

## 2015-03-07 DIAGNOSIS — C320 Malignant neoplasm of glottis: Secondary | ICD-10-CM

## 2015-03-07 DIAGNOSIS — C61 Malignant neoplasm of prostate: Secondary | ICD-10-CM

## 2015-03-07 DIAGNOSIS — Z510 Encounter for antineoplastic radiation therapy: Secondary | ICD-10-CM | POA: Diagnosis not present

## 2015-03-07 NOTE — Progress Notes (Signed)
  Radiation Oncology         [phone]   Name: Timothy Suarez MRN: [id number]   Date: 03/07/2015  DOB: 11/27/1924     Weekly Radiation Therapy Management  Diagnosis: Timothy Suarez is a 81 year old gentleman presenting to clinic in regards to his stage T1a squamous cell carcinoma of the right true vocal cord.  Current Dose: 18 Gy  Planned Dose:  63 Gy  Narrative The patient presents for routine under treatment assessment and has completed 8 out of 28 treatments thus far. A slight pink tinge on neck was observed at today's visit. The patient is self administering Biafine, provided via his last visit with radiation oncology. At this time, he denies nausea, difficulty  swallowing foods or liquids, throat pain, or unexpected weight loss. He stated that he experienced an episode of dizzyness yesterday and this has occurred on more than one occasion. Set-up films were reviewed and the chart was checked.  Physical Findings  weight is 212 lb 1.6 oz (96.208 kg). His oral temperature is 97.8 F (36.6 C). His blood pressure is 151/63 and his pulse is 63. His respiration is 20 and oxygen saturation is 98%. Marland Kitchen His weight is essentially stable with no significant changes in overall status of healthy to be noted at this time.  Impression Timothy Suarez is a 81 year old gentleman presenting to clinic in regards to his stage T1a squamous cell carcinoma of the right true vocal cord. The patient is tolerating radiation.  Plan The patient has been advised to continue treatment as planned and has been made aware of his follow up appointment to take place with radiation oncology. He was instructed on options and healthy methods to address his episodes of dizziness, such as in taking more daily fluids. Common symptoms to expect at this point in the patient's treatment were discussed and healthy methods to manage these symptoms if they are too occur. He was encouraged to continue the self administration of Biafine  provided via his previous visit with radiation oncology.    This document serves as a record of services personally performed by Tyler Pita, MD. It was created on his behalf by Lenn Cal, a trained medical scribe. The creation of this record is based on the scribe's personal observations and the provider's statements to them. This document has been checked and approved by the attending provider.  ________________________________________   Sheral Apley. Tammi Klippel, M.D.

## 2015-03-07 NOTE — Progress Notes (Signed)
Weekly rad txs vocal cord 8/28 completed, , slight pink tinge on neck, has biafine  Uses daily, hoarseness, no nausea, no difficulty  swallowing foods or liquids, no throat pain, c/o dizzy ness yesterday and at times, took ortho static b/p Sitting B/P=162/59,P=62,RR=20,T=97.8 Standing B/p=151/63, P=63 no c/o pain, suggested to sit at side of bed in am when getting up for a minute before moving, if dizzy, encouraged to drink more water  Fatigue mild ,  Wt Readings from Last 3 Encounters:  03/07/15 212 lb 1.6 oz (96.208 kg)  03/01/15 211 lb 6.4 oz (95.89 kg)  02/14/15 211 lb 14.4 oz (96.117 kg)

## 2015-03-08 ENCOUNTER — Ambulatory Visit
Admission: RE | Admit: 2015-03-08 | Discharge: 2015-03-08 | Disposition: A | Discharge status: Home / Self Care | Payer: Medicare Other | Source: Ambulatory Visit | Attending: Radiation Oncology | Admitting: Radiation Oncology

## 2015-03-08 DIAGNOSIS — Z510 Encounter for antineoplastic radiation therapy: Secondary | ICD-10-CM | POA: Diagnosis not present

## 2015-03-08 NOTE — Progress Notes (Signed)
  Oncology Nurse Navigator Documentation   Navigator Encounter Type: Clinic/MDC (03/07/15 0940)     To provide support and encouragement, care continuity and to assess for needs, met with patient after RT and prior to WUT with Dr. Tammi Klippel. He denied throat soreness.    We discussed that he can expect increased soreness and voice hoarseness as tmts proceed but that these will start to resolve a couple weeks after tmts are completed. He reported occasional dizziness.  He is unsure of his daily fluid intake, recognizes that he needs to increase.    We discussed how the daily placement of water bottles where he can see them would help serve as a reminder to increase consumption.   We discussed the BID application of Biafine.  He verbalized understanding that he should not apply less than 4 hrs prior to tmt. He did not express any needs or concerns at this time, I encouraged him to contact me if that changes, he verbalized understanding.   Gayleen Orem, RN, BSN, [place] at [place] [phone]

## 2015-03-11 ENCOUNTER — Ambulatory Visit
Admission: RE | Admit: 2015-03-11 | Discharge: 2015-03-11 | Disposition: A | Discharge status: Home / Self Care | Payer: Medicare Other | Source: Ambulatory Visit | Attending: Radiation Oncology | Admitting: Radiation Oncology

## 2015-03-11 DIAGNOSIS — Z510 Encounter for antineoplastic radiation therapy: Secondary | ICD-10-CM | POA: Diagnosis not present

## 2015-03-12 ENCOUNTER — Ambulatory Visit
Admission: RE | Admit: 2015-03-12 | Discharge: 2015-03-12 | Disposition: A | Discharge status: Home / Self Care | Payer: Medicare Other | Source: Ambulatory Visit | Attending: Radiation Oncology | Admitting: Radiation Oncology

## 2015-03-12 DIAGNOSIS — Z510 Encounter for antineoplastic radiation therapy: Secondary | ICD-10-CM | POA: Diagnosis not present

## 2015-03-13 ENCOUNTER — Ambulatory Visit
Admission: RE | Admit: 2015-03-13 | Discharge: 2015-03-13 | Disposition: A | Discharge status: Home / Self Care | Payer: Medicare Other | Source: Ambulatory Visit | Attending: Radiation Oncology | Admitting: Radiation Oncology

## 2015-03-13 DIAGNOSIS — Z510 Encounter for antineoplastic radiation therapy: Secondary | ICD-10-CM | POA: Diagnosis not present

## 2015-03-14 ENCOUNTER — Ambulatory Visit: Payer: Medicare Other | Admitting: Internal Medicine

## 2015-03-14 ENCOUNTER — Ambulatory Visit
Admission: RE | Admit: 2015-03-14 | Discharge: 2015-03-14 | Disposition: A | Discharge status: Home / Self Care | Payer: Medicare Other | Source: Ambulatory Visit | Attending: Radiation Oncology | Admitting: Radiation Oncology

## 2015-03-14 DIAGNOSIS — Z510 Encounter for antineoplastic radiation therapy: Secondary | ICD-10-CM | POA: Diagnosis not present

## 2015-03-15 ENCOUNTER — Ambulatory Visit
Admission: RE | Admit: 2015-03-15 | Discharge: 2015-03-15 | Disposition: A | Discharge status: Home / Self Care | Payer: Medicare Other | Source: Ambulatory Visit | Attending: Radiation Oncology | Admitting: Radiation Oncology

## 2015-03-15 ENCOUNTER — Telehealth: Payer: Self-pay

## 2015-03-15 ENCOUNTER — Encounter: Payer: Self-pay | Admitting: Radiation Oncology

## 2015-03-15 VITALS — BP 153/71 | HR 88 | Temp 97.8°F | Resp 20 | Wt 212.8 lb

## 2015-03-15 DIAGNOSIS — C320 Malignant neoplasm of glottis: Secondary | ICD-10-CM

## 2015-03-15 DIAGNOSIS — Z510 Encounter for antineoplastic radiation therapy: Secondary | ICD-10-CM | POA: Diagnosis not present

## 2015-03-15 MED ORDER — MAGIC MOUTHWASH W/LIDOCAINE: 5.0000 mL | Freq: Four times a day (QID) | ORAL | Status: DC | PRN

## 2015-03-15 NOTE — Telephone Encounter (Signed)
Prescription for magic mouthwash according to cocktail of 80 ml each of maalox plus, diphenhydramine, nystatin and 2% viscous lidocaine called into [place] on [address]. to [facility].Will inform patient to wait until late this afternoon for pick-up.

## 2015-03-15 NOTE — Progress Notes (Signed)
Weekly rad txs, 14/28  vocal cord completed, loss appetite,  Starting to have swallowing difficulty  Mild in throat, sore ness, not uncomfortable stated, no loss weight, no c/o nausea, energy level  Mild fatigue,  Hoarseness,whispering, cough up phelgm white, ,quit his wellbutrin, made  Him too weak stated, doesn't fell he is depressed 9:29 AM BP 153/71 mmHg  Pulse 88  Temp(Src) 97.8 F (36.6 C) (Oral)  Resp 20  Wt 212 lb 12.8 oz (96.525 kg)  SpO2 98%  Wt Readings from Last 3 Encounters:  03/15/15 212 lb 12.8 oz (96.525 kg)  03/07/15 212 lb 1.6 oz (96.208 kg)  03/01/15 211 lb 6.4 oz (95.89 kg)

## 2015-03-15 NOTE — Telephone Encounter (Signed)
Called patient and informed to pickup script for mmw this afternoon.

## 2015-03-15 NOTE — Progress Notes (Signed)
   Department of Radiation Oncology  Phone:  [phone] Fax:        [phone]  Weekly Treatment Note    Name: Timothy Suarez Date: 03/15/2015 MRN: [id number] DOB: September 05, 1924   Current dose: 31.5 Gy  Current fraction: 14   MEDICATIONS: Current Outpatient Prescriptions  Medication Sig Dispense Refill  . aspirin EC 81 MG tablet Take 1 tablet (81 mg total) by mouth daily. 30 tablet 3  . atorvastatin (LIPITOR) 40 MG tablet Take 1 tablet (40 mg total) by mouth daily at 6 PM. For cholesterol 90 tablet 3  . Cholecalciferol (VITAMIN D) 2000 UNITS CAPS Take 1 capsule (2,000 Units total) by mouth daily. 30 capsule 3  . metoprolol succinate (TOPROL-XL) 25 MG 24 hr tablet Take 0.5 tablets (12.5 mg total) by mouth daily. 30 tablet 11  . travoprost, benzalkonium, (TRAVATAN) 0.004 % ophthalmic solution Place 1 drop into both eyes at bedtime.      Marland Kitchen zolpidem (AMBIEN) 5 MG tablet TAKE 1 TABLET BY MOUTH EVERY NIGHT AT BEDTIME AS NEEDED FOR SLEEP 30 tablet 0  . Alum & Mag Hydroxide-Simeth (MAGIC MOUTHWASH W/LIDOCAINE) SOLN Take 5 mLs by mouth 4 (four) times daily as needed for mouth pain. 400 mL 1  . buPROPion (WELLBUTRIN XL) 150 MG 24 hr tablet Take 1 tablet (150 mg total) by mouth daily. (Patient not taking: Reported on 03/15/2015) 30 tablet 5   No current facility-administered medications for this encounter.     ALLERGIES: Citalopram and Trazodone and nefazodone   LABORATORY DATA:  Lab Results  Component Value Date   WBC 8.5 01/29/2015   HGB 15.3 01/29/2015   HCT 44.9 01/29/2015   MCV 95.1 01/29/2015   PLT 151 01/29/2015   Lab Results  Component Value Date   NA 138 01/29/2015   K 3.7 01/29/2015   CL 110 01/29/2015   CO2 22 01/29/2015   Lab Results  Component Value Date   ALT 12 04/02/2014   AST 13 04/02/2014   ALKPHOS 107 04/02/2014   BILITOT 0.4 04/02/2014     NARRATIVE: Timothy Suarez was seen today for weekly treatment management. The chart was checked and the  patient's films were reviewed.  Weekly rad txs, 14/28 vocal cord completed, loss appetite. Starting to have swallowing difficulty. Mild in throat, soreness, not uncomfortable stated, no loss weight, no c/o nausea, energy level. Mild fatigue,hoarseness, whispering, cough up phelgm white, quit his wellbutrin, madehim too weak stated, doesn't fell he is depressed.   PHYSICAL EXAMINATION: weight is 212 lb 12.8 oz (96.525 kg). His oral temperature is 97.8 F (36.6 C). His blood pressure is 153/71 and his pulse is 88. His respiration is 20 and oxygen saturation is 98%.        Skin: Has mild hyperpigmentation within the neck.  ASSESSMENT: The patient is doing satisfactorily with treatment.  PLAN: We will continue with the patient's radiation treatment as planned. Prescription for magic mouthwash given for early throat soreness.        ------------------------------------------------  Jodelle Gross, MD, PhD  This document serves as a record of services personally performed by Kyung Rudd, MD. It was created on his behalf by Derek Mound, a trained medical scribe. The creation of this record is based on the scribe's personal observations and the provider's statements to them. This document has been checked and approved by the attending provider.

## 2015-03-18 ENCOUNTER — Other Ambulatory Visit: Payer: Self-pay | Admitting: *Deleted

## 2015-03-18 ENCOUNTER — Ambulatory Visit
Admission: RE | Admit: 2015-03-18 | Discharge: 2015-03-18 | Disposition: A | Discharge status: Home / Self Care | Payer: Medicare Other | Source: Ambulatory Visit | Attending: Radiation Oncology | Admitting: Radiation Oncology

## 2015-03-18 DIAGNOSIS — Z510 Encounter for antineoplastic radiation therapy: Secondary | ICD-10-CM | POA: Diagnosis not present

## 2015-03-18 MED ORDER — ZOLPIDEM TARTRATE 5 MG PO TABS: ORAL_TABLET | ORAL | Status: DC

## 2015-03-18 NOTE — Telephone Encounter (Signed)
[employer] [facility] 

## 2015-03-19 ENCOUNTER — Ambulatory Visit
Admission: RE | Admit: 2015-03-19 | Discharge: 2015-03-19 | Disposition: A | Discharge status: Home / Self Care | Payer: Medicare Other | Source: Ambulatory Visit | Attending: Radiation Oncology | Admitting: Radiation Oncology

## 2015-03-19 DIAGNOSIS — Z510 Encounter for antineoplastic radiation therapy: Secondary | ICD-10-CM | POA: Diagnosis not present

## 2015-03-20 ENCOUNTER — Telehealth: Payer: Self-pay | Admitting: Radiation Oncology

## 2015-03-20 ENCOUNTER — Other Ambulatory Visit: Payer: Self-pay | Admitting: Radiation Oncology

## 2015-03-20 ENCOUNTER — Ambulatory Visit
Admission: RE | Admit: 2015-03-20 | Discharge: 2015-03-20 | Disposition: A | Discharge status: Home / Self Care | Payer: Medicare Other | Source: Ambulatory Visit | Attending: Radiation Oncology | Admitting: Radiation Oncology

## 2015-03-20 VITALS — BP 138/62 | HR 89 | Resp 18 | Wt 211.0 lb

## 2015-03-20 DIAGNOSIS — R1310 Dysphagia, unspecified: Secondary | ICD-10-CM

## 2015-03-20 DIAGNOSIS — K1233 Oral mucositis (ulcerative) due to radiation: Secondary | ICD-10-CM

## 2015-03-20 DIAGNOSIS — C320 Malignant neoplasm of glottis: Secondary | ICD-10-CM

## 2015-03-20 DIAGNOSIS — E860 Dehydration: Secondary | ICD-10-CM

## 2015-03-20 DIAGNOSIS — Z510 Encounter for antineoplastic radiation therapy: Secondary | ICD-10-CM | POA: Diagnosis not present

## 2015-03-20 MED ORDER — HYDROCODONE-ACETAMINOPHEN 7.5-325 MG/15ML PO SOLN: 5.0000 mL | Freq: Four times a day (QID) | ORAL | Status: DC | PRN

## 2015-03-20 MED ORDER — HYDROCODONE-ACETAMINOPHEN 7.5-325 MG/15ML PO SOLN: 10.0000 mL | Freq: Four times a day (QID) | ORAL | Status: DC | PRN

## 2015-03-20 NOTE — Telephone Encounter (Signed)
Phoned Timothy Suarez of sickle cell and arranged hydration for 1000 on 03/21/2015 and 03/22/2015. Phoned patient's daughter, Timothy Suarez, informing her of these appointments. Also, informed her there is a Hycet script that will be ready for pick up today after 1600 or tomorrow. She verbalized understanding.

## 2015-03-20 NOTE — Progress Notes (Signed)
Patient presented to the clinic today accompanied by his daughter, Mickel Baas. Patient reports that his throat is bothering him despite using the magic mouthwash with Lidocaine Dr. Lisbeth Renshaw gave him on 03/15/15. Patient reports he feel dizzy when he stands. Patient reports he has had to receive hydration in the past for this same problem. Will communicate findings to Dr. Tammi Klippel and call patient with further direction.

## 2015-03-21 ENCOUNTER — Encounter: Payer: Self-pay | Admitting: Radiation Oncology

## 2015-03-21 ENCOUNTER — Ambulatory Visit
Admission: RE | Admit: 2015-03-21 | Discharge: 2015-03-21 | Disposition: A | Discharge status: Home / Self Care | Payer: Medicare Other | Source: Ambulatory Visit | Attending: Radiation Oncology | Admitting: Radiation Oncology

## 2015-03-21 ENCOUNTER — Observation Stay (HOSPITAL_COMMUNITY)
Admission: RE | Admit: 2015-03-21 | Discharge: 2015-03-21 | Disposition: A | Discharge status: Home / Self Care | Payer: Medicare Other | Source: Ambulatory Visit | Attending: Radiation Oncology | Admitting: Radiation Oncology

## 2015-03-21 DIAGNOSIS — R1310 Dysphagia, unspecified: Secondary | ICD-10-CM | POA: Insufficient documentation

## 2015-03-21 DIAGNOSIS — C320 Malignant neoplasm of glottis: Secondary | ICD-10-CM | POA: Diagnosis not present

## 2015-03-21 DIAGNOSIS — Z510 Encounter for antineoplastic radiation therapy: Secondary | ICD-10-CM | POA: Diagnosis not present

## 2015-03-21 DIAGNOSIS — E860 Dehydration: Secondary | ICD-10-CM | POA: Insufficient documentation

## 2015-03-21 MED ORDER — SODIUM CHLORIDE 0.9 % IV SOLN
INTRAVENOUS | Status: DC
  Administered 2015-03-21: 11:00:00 via INTRAVENOUS

## 2015-03-21 NOTE — Procedures (Signed)
Associated Diagnosis: Dehydration E. 86.0 MD: Tammi Klippel  Procedure Note: Infusion 1L normal saline Condition during procedure: Tolerated well Condition after procedure: Alert, oriented, and ambulatory

## 2015-03-21 NOTE — Progress Notes (Addendum)
Four pound weight loss noted. BP elevated. Denies pain. Reports difficult and painful swallowing. Has Hycodan and Kellogg. Hoarseness noted. Hyperpigmentation without desquamation of treatment field noted. Reports using Biafine as directed. Scheduled for hydration at the sickle cell clinic today.  BP 152/64 mmHg  Pulse 81  Resp 16  Wt 207 lb 3.2 oz (93.985 kg)  SpO2 100% Wt Readings from Last 3 Encounters:  03/21/15 207 lb 3.2 oz (93.985 kg)  03/20/15 211 lb (95.709 kg)  03/15/15 212 lb 12.8 oz (96.525 kg)

## 2015-03-21 NOTE — Progress Notes (Signed)
  Radiation Oncology         [phone]   Name: Timothy Suarez MRN: [id number]   Date: 03/21/2015  DOB: Dec 19, 1924     Weekly Radiation Therapy Management  Diagnosis: Timothy Suarez is a 81 year old gentleman presenting to clinic in regards to his stage T1a squamous cell carcinoma of the right true vocal cord.  Current Dose: 40.5 Gy  Planned Dose:  63 Gy  Narrative The patient presents for routine under treatment assessment. Denies pain. Reports difficult and painful swallowing. Reports that he can only eat soft foods and can only drink water. Has Hycodan and Kellogg. Hoarseness noted. Hyperpigmentation without desquamation of the treatment field noted. Reports using Biafine as directed. Scheduled for hydration at the sickle cell clinic today. Set-up films were reviewed and the chart was checked.  Physical Findings  weight is 207 lb 3.2 oz (93.985 kg). His blood pressure is 152/64 and his pulse is 81. His respiration is 16 and oxygen saturation is 100%.  4 lb weight loss noted since yesterday. Wt Readings from Last 3 Encounters:  03/21/15 207 lb 3.2 oz (93.985 kg)  03/20/15 211 lb (95.709 kg)  03/15/15 212 lb 12.8 oz (96.525 kg)    Impression Timothy Suarez is a 81 year old gentleman presenting to clinic in regards to his stage T1a squamous cell carcinoma of the right true vocal cord. The patient is tolerating radiation.  Plan Continue radiation treatment as planned. The pt is receiving IV fluids today and I advised him to keep up his nutrition and fluid uptake.    This document serves as a record of services personally performed by Tyler Pita, MD. It was created on his behalf by Darcus Austin, a trained medical scribe. The creation of this record is based on the scribe's personal observations and the provider's statements to them. This document has been checked and approved by the attending provider.     ________________________________________   Sheral Apley.  Tammi Klippel, M.D.

## 2015-03-22 ENCOUNTER — Ambulatory Visit (HOSPITAL_COMMUNITY)
Admission: RE | Admit: 2015-03-22 | Discharge: 2015-03-22 | Disposition: A | Discharge status: Home / Self Care | Payer: Medicare Other | Source: Ambulatory Visit | Attending: Radiation Oncology | Admitting: Radiation Oncology

## 2015-03-22 ENCOUNTER — Telehealth: Payer: Self-pay | Admitting: *Deleted

## 2015-03-22 ENCOUNTER — Ambulatory Visit
Admission: RE | Admit: 2015-03-22 | Discharge: 2015-03-22 | Disposition: A | Discharge status: Home / Self Care | Payer: Medicare Other | Source: Ambulatory Visit | Attending: Radiation Oncology | Admitting: Radiation Oncology

## 2015-03-22 DIAGNOSIS — E860 Dehydration: Secondary | ICD-10-CM | POA: Diagnosis present

## 2015-03-22 DIAGNOSIS — Z510 Encounter for antineoplastic radiation therapy: Secondary | ICD-10-CM | POA: Diagnosis not present

## 2015-03-22 MED ORDER — SODIUM CHLORIDE 0.9 % IV SOLN
INTRAVENOUS | Status: DC
  Administered 2015-03-22: 10:00:00 via INTRAVENOUS

## 2015-03-22 NOTE — Telephone Encounter (Signed)
  Oncology Nurse Navigator Documentation   Navigator Encounter Type: Telephone (03/22/15 7654)     Returned pt dtr's VM, provided clarification for today's appts.   Gayleen Orem, RN, BSN, [place] at [place] [phone]

## 2015-03-22 NOTE — Procedures (Signed)
[facility]  Procedure Note  SHMIEL MORTON DJM:[id number] DOB: 19-Sep-1924 DOA: 03/22/2015   PCP: Hollace Kinnier, DO   Associated Diagnosis: Dehydration (276.51)  Procedure Note: IV infusion of Normal saline   Condition During Procedure:  Pt tolerated well   Condition at Discharge:  No complications noted   Nigel Sloop, [facility]

## 2015-03-25 ENCOUNTER — Ambulatory Visit: Payer: Medicare Other | Admitting: Internal Medicine

## 2015-03-25 ENCOUNTER — Ambulatory Visit: Payer: Medicare Other

## 2015-03-25 ENCOUNTER — Ambulatory Visit
Admission: RE | Admit: 2015-03-25 | Discharge: 2015-03-25 | Disposition: A | Discharge status: Home / Self Care | Payer: Medicare Other | Source: Ambulatory Visit | Attending: Radiation Oncology | Admitting: Radiation Oncology

## 2015-03-25 DIAGNOSIS — Z510 Encounter for antineoplastic radiation therapy: Secondary | ICD-10-CM | POA: Diagnosis not present

## 2015-03-26 ENCOUNTER — Ambulatory Visit: Payer: Medicare Other

## 2015-03-26 ENCOUNTER — Ambulatory Visit
Admission: RE | Admit: 2015-03-26 | Discharge: 2015-03-26 | Disposition: A | Discharge status: Home / Self Care | Payer: Medicare Other | Source: Ambulatory Visit | Attending: Radiation Oncology | Admitting: Radiation Oncology

## 2015-03-26 DIAGNOSIS — Z510 Encounter for antineoplastic radiation therapy: Secondary | ICD-10-CM | POA: Diagnosis not present

## 2015-03-27 ENCOUNTER — Other Ambulatory Visit: Payer: Self-pay | Admitting: Radiation Oncology

## 2015-03-27 ENCOUNTER — Ambulatory Visit: Payer: Medicare Other

## 2015-03-27 ENCOUNTER — Ambulatory Visit
Admission: RE | Admit: 2015-03-27 | Discharge: 2015-03-27 | Disposition: A | Discharge status: Home / Self Care | Payer: Medicare Other | Source: Ambulatory Visit | Attending: Radiation Oncology | Admitting: Radiation Oncology

## 2015-03-27 DIAGNOSIS — R1310 Dysphagia, unspecified: Secondary | ICD-10-CM

## 2015-03-27 DIAGNOSIS — K1233 Oral mucositis (ulcerative) due to radiation: Secondary | ICD-10-CM

## 2015-03-27 DIAGNOSIS — C320 Malignant neoplasm of glottis: Secondary | ICD-10-CM

## 2015-03-27 DIAGNOSIS — Z510 Encounter for antineoplastic radiation therapy: Secondary | ICD-10-CM | POA: Diagnosis not present

## 2015-03-27 MED ORDER — HYDROCODONE-ACETAMINOPHEN 7.5-325 MG/15ML PO SOLN: 5.0000 mL | Freq: Four times a day (QID) | ORAL | Status: DC | PRN

## 2015-03-27 MED ORDER — HYDROCODONE-ACETAMINOPHEN 7.5-325 MG/15ML PO SOLN: 10.0000 mL | Freq: Four times a day (QID) | ORAL | Status: DC | PRN

## 2015-03-28 ENCOUNTER — Ambulatory Visit: Admission: RE | Admit: 2015-03-28 | Payer: Medicare Other | Source: Ambulatory Visit | Admitting: Radiation Oncology

## 2015-03-28 ENCOUNTER — Encounter: Payer: Self-pay | Admitting: *Deleted

## 2015-03-28 ENCOUNTER — Ambulatory Visit
Admission: RE | Admit: 2015-03-28 | Discharge: 2015-03-28 | Disposition: A | Discharge status: Home / Self Care | Payer: Medicare Other | Source: Ambulatory Visit | Attending: Radiation Oncology | Admitting: Radiation Oncology

## 2015-03-28 ENCOUNTER — Ambulatory Visit: Payer: Medicare Other

## 2015-03-28 DIAGNOSIS — Z510 Encounter for antineoplastic radiation therapy: Secondary | ICD-10-CM | POA: Diagnosis not present

## 2015-03-28 NOTE — Progress Notes (Addendum)
  Oncology Nurse Navigator Documentation   Navigator Encounter Type: Treatment (03/28/15 0920)     To provide support and encouragement, care continuity and to assess for needs, met with patient after his daily RT, escorted him to [facility] for RN evaluation/Rx. He reported voice hoarseness somewhat unchanged, throat pain. He expressed anticipation of final tmt next Thursday. He did not express any needs or concerns at this time, I encouraged him to contact me if that changes, he verbalized understanding.  Gayleen Orem, RN, BSN, [place] at [place] [phone]

## 2015-03-28 NOTE — Progress Notes (Signed)
Provided patient with Hycet script. Checked vitals for good measure. Patient does not prove to be orthostatic. Patient doesn't feel need for hydration at this time. Patient continues to be hoarse. Patient reports pain and difficulty swallowing continues. Will see patient tomorrow for PUT.

## 2015-03-29 ENCOUNTER — Ambulatory Visit
Admission: RE | Admit: 2015-03-29 | Discharge: 2015-03-29 | Disposition: A | Discharge status: Home / Self Care | Payer: Medicare Other | Source: Ambulatory Visit | Attending: Radiation Oncology | Admitting: Radiation Oncology

## 2015-03-29 ENCOUNTER — Encounter: Payer: Self-pay | Admitting: Radiation Oncology

## 2015-03-29 ENCOUNTER — Ambulatory Visit: Payer: Medicare Other

## 2015-03-29 VITALS — BP 148/91 | HR 82 | Resp 16 | Wt 206.8 lb

## 2015-03-29 DIAGNOSIS — C320 Malignant neoplasm of glottis: Secondary | ICD-10-CM

## 2015-03-29 DIAGNOSIS — Z510 Encounter for antineoplastic radiation therapy: Secondary | ICD-10-CM | POA: Diagnosis not present

## 2015-03-29 NOTE — Progress Notes (Signed)
  Radiation Oncology         [phone]   Name: Timothy Suarez MRN: [id number]   Date: 03/29/2015  DOB: 02-12-1925     Weekly Radiation Therapy Management    ICD-9-CM ICD-10-CM   1. 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  Current Dose: 54 Gy  Planned Dose:  63 Gy  Narrative The patient presents for routine under treatment assessment. Weight stable. BP elevated. Hoarseness continues. Hyperpigmentation without desquamation of anterior neck noted. Reports using Biafine bid as directed. Reports painful and difficult swallowing. Reports he strangles easily on water. Reports fatigue. Reports weakness. Pt recently filled his hydrocodone and has only taken one dose so far. Reports drinking Ensure and Boost.  Set-up films were reviewed and the chart was checked.  Physical Findings  vitals were not taken for this visit.  Wt Readings from Last 3 Encounters:  03/28/15 207 lb 4.8 oz (94.031 kg)  03/21/15 207 lb 3.2 oz (93.985 kg)  03/20/15 211 lb (95.709 kg)    Impression Timothy Suarez is a 81 year old gentleman presenting to clinic in regards to his stage T1a squamous cell carcinoma of the right true vocal cord. The patient is tolerating radiation.  Plan Continue radiation treatment as planned.    This document serves as a record of services personally performed by Tyler Pita, MD. It was created on his behalf by Arlyce Harman, a trained medical scribe. The creation of this record is based on the scribe's personal observations and the provider's statements to them. This document has been checked and approved by the attending provider.    ________________________________________   Sheral Apley. Tammi Klippel, M.D.

## 2015-03-29 NOTE — Progress Notes (Signed)
Weight stable. BP elevated. Hoarseness continues. Hyperpigmentation without desquamation of anterior neck noted. Reports using Biafine bid as directed. Reports painful and difficult swallowing. Reports he strangles easily on water. Reports fatigue.          BP 148/91 mmHg  Pulse 82  Resp 16  Wt 206 lb 12.8 oz (93.804 kg) Wt Readings from Last 3 Encounters:  03/29/15 206 lb 12.8 oz (93.804 kg)  03/28/15 207 lb 4.8 oz (94.031 kg)  03/21/15 207 lb 3.2 oz (93.985 kg)

## 2015-04-01 ENCOUNTER — Ambulatory Visit: Payer: Medicare Other | Admitting: Internal Medicine

## 2015-04-01 ENCOUNTER — Ambulatory Visit
Admission: RE | Admit: 2015-04-01 | Discharge: 2015-04-01 | Disposition: A | Discharge status: Home / Self Care | Payer: Medicare Other | Source: Ambulatory Visit | Attending: Radiation Oncology | Admitting: Radiation Oncology

## 2015-04-01 ENCOUNTER — Encounter: Payer: Self-pay | Admitting: Radiation Oncology

## 2015-04-01 ENCOUNTER — Telehealth: Payer: Self-pay

## 2015-04-01 VITALS — BP 146/66 | HR 80 | Temp 97.7°F | Resp 18 | Wt 206.1 lb

## 2015-04-01 DIAGNOSIS — E860 Dehydration: Secondary | ICD-10-CM | POA: Diagnosis not present

## 2015-04-01 DIAGNOSIS — Z510 Encounter for antineoplastic radiation therapy: Secondary | ICD-10-CM | POA: Diagnosis not present

## 2015-04-01 DIAGNOSIS — C320 Malignant neoplasm of glottis: Secondary | ICD-10-CM

## 2015-04-01 MED ORDER — SODIUM CHLORIDE 0.9 % IJ SOLN
10.0000 mL | Freq: Once | INTRAMUSCULAR | Status: AC
  Administered 2015-04-01: 10 mL via INTRAVENOUS

## 2015-04-01 MED ORDER — SODIUM CHLORIDE 0.9 % IV SOLN
Freq: Once | INTRAVENOUS | Status: AC
  Administered 2015-04-01: 10:00:00 via INTRAVENOUS
  Filled 2015-04-01: qty 1000

## 2015-04-01 NOTE — Telephone Encounter (Signed)
Patient called and informed Enid Derry that he had returned home safely.

## 2015-04-01 NOTE — Progress Notes (Signed)
Patient alert and oriented x 3.States he feel so much better.Ambulated to bathroom x 2 with steady gait.IV hydration completed, catheter d/c'd intact.Patient is resident at [facility].Gave card with our direct number to call on arrival back to facility.Orthostatic vitals improved.BP 146/66 mmHg  Pulse 80  Temp(Src) 97.7 F (36.5 C) (Oral)  Resp 18  Wt 206 lb 1.6 oz (93.486 kg)  SpO2 100%

## 2015-04-01 NOTE — Progress Notes (Signed)
Department of Radiation Oncology  Phone:  [phone] Fax:        [phone]  Weekly Treatment Note    Name: Timothy Suarez Date: 04/01/2015 MRN: [id number] DOB: March 06, 1925   Current dose: 56.25 Gy  Current fraction: 25   MEDICATIONS: Current Outpatient Prescriptions  Medication Sig Dispense Refill  . Alum & Mag Hydroxide-Simeth (MAGIC MOUTHWASH W/LIDOCAINE) SOLN Take 5 mLs by mouth 4 (four) times daily as needed for mouth pain. 400 mL 1  . aspirin EC 81 MG tablet Take 1 tablet (81 mg total) by mouth daily. 30 tablet 3  . atorvastatin (LIPITOR) 40 MG tablet Take 1 tablet (40 mg total) by mouth daily at 6 PM. For cholesterol 90 tablet 3  . buPROPion (WELLBUTRIN XL) 150 MG 24 hr tablet Take 1 tablet (150 mg total) by mouth daily. 30 tablet 5  . Cholecalciferol (VITAMIN D) 2000 UNITS CAPS Take 1 capsule (2,000 Units total) by mouth daily. 30 capsule 3  . HYDROcodone-acetaminophen (HYCET) 7.5-325 mg/15 ml solution Take 5-15 mLs by mouth 4 (four) times daily as needed for moderate pain. 473 mL 0  . metoprolol succinate (TOPROL-XL) 25 MG 24 hr tablet Take 0.5 tablets (12.5 mg total) by mouth daily. 30 tablet 11  . travoprost, benzalkonium, (TRAVATAN) 0.004 % ophthalmic solution Place 1 drop into both eyes at bedtime.      Marland Kitchen zolpidem (AMBIEN) 5 MG tablet Take one tablet by mouth every night at bedtime as needed for sleep 30 tablet 0   No current facility-administered medications for this encounter.     ALLERGIES: Citalopram and Trazodone and nefazodone   LABORATORY DATA:  Lab Results  Component Value Date   WBC 8.5 01/29/2015   HGB 15.3 01/29/2015   HCT 44.9 01/29/2015   MCV 95.1 01/29/2015   PLT 151 01/29/2015   Lab Results  Component Value Date   NA 138 01/29/2015   K 3.7 01/29/2015   CL 110 01/29/2015   CO2 22 01/29/2015   Lab Results  Component Value Date   ALT 12 04/02/2014   AST 13 04/02/2014   ALKPHOS 107 04/02/2014   BILITOT 0.4 04/02/2014      NARRATIVE: Timothy Suarez was seen today for weekly treatment management. The chart was checked and the patient's films were reviewed.  Patient is orthostatic. Weight stable. Reports a sore throat and difficulty swallowing. Reports he isn't eating well due to pain associated with swallowing. Hyperpigmentation without desquamation of anterior neck noted. Reports using biafine as directed. Hoarseness noted. Patient weak. Patient diaphoretic. Patient reports that he lives approximately four miles from [facility] but, became confused driving in for treatment today. He tells me today that he missed his turn but was never unsure where he was. He turned around and proceeded to the cancer center. However, patient states this happened several times before.   Patients states he can only drink coffee & soft drinks but when drinking water feels "strangled".  Patient states he gets dizzy when standing up.  PHYSICAL EXAMINATION: weight is 206 lb 1.6 oz (93.486 kg). His oral temperature is 97.7 F (36.5 C). His blood pressure is 148/58 and his pulse is 82. His respiration is 18 and oxygen saturation is 100%.        ASSESSMENT: The patient is doing satisfactorily with treatment. However, the patient is not getting enough fluids.   PLAN: We will continue with the patient's radiation treatment as planned. 1 L liter of fluids was given to the patient  by nursing.   ------------------------------------------------  Jodelle Gross, MD, PhD  This document serves as a record of services personally performed by Kyung Rudd, MD. It was created on his behalf by Derek Mound, a trained medical scribe. The creation of this record is based on the scribe's personal observations and the provider's statements to them. This document has been checked and approved by the attending provider.

## 2015-04-01 NOTE — Progress Notes (Addendum)
Patient is slightly orthostatic. Weight stable. Reports a sore throat and difficulty swallowing. Reports he isn't eating well due to pain associated with swallowing. Hyperpigmentation without desquamation of anterior neck noted. Reports using biafine as directed. Hoarseness noted. Patient weak. Patient diaphoretic. Patient reports that he lives approximately four miles from [facility] but, became confused driving in for treatment today.   BP 148/58 mmHg  Pulse 82  Temp(Src) 97.7 F (36.5 C) (Oral)  Resp 18  Wt 206 lb 1.6 oz (93.486 kg)  SpO2 100% Wt Readings from Last 3 Encounters:  04/01/15 206 lb 1.6 oz (93.486 kg)  03/29/15 206 lb 12.8 oz (93.804 kg)  03/28/15 207 lb 4.8 oz (94.031 kg)

## 2015-04-01 NOTE — Progress Notes (Addendum)
Suspect patient is dehydrated since vitals are slightly orthostatic. Weight stable. Patient does report feeling dizzy upon standing. Per [name] order will hydrate with 1000 cc normal saline over 2 hours. Started left AC 22 gauge IV on the first attempt. Patient tolerated well. Began hydration. Encouraged patient to ring nurse with needs. Call bell within reach. Will continue to monitor patient will saline continues to infuse. Additionally, patient requested this nurse speak with Dr. Tammi Klippel about delaying his prostate treatment for one month following vocal cord treatment so that he has time "to heal and get his strength back." Will communicate this finding to Dr. Tammi Klippel.

## 2015-04-02 ENCOUNTER — Ambulatory Visit
Admission: RE | Admit: 2015-04-02 | Discharge: 2015-04-02 | Disposition: A | Discharge status: Home / Self Care | Payer: Medicare Other | Source: Ambulatory Visit | Attending: Radiation Oncology | Admitting: Radiation Oncology

## 2015-04-02 DIAGNOSIS — Z510 Encounter for antineoplastic radiation therapy: Secondary | ICD-10-CM | POA: Diagnosis not present

## 2015-04-02 NOTE — Telephone Encounter (Signed)
Eve, RT of L1 expressed concern that patient seemed confused. Unable to find patient for evaluation. Phoned patient's daughter, Hilda Blades. Expressed concerns. Daughter reports she will contact RN once her father arrives home. Daughter, Hilda Blades, called back ten minutes later informing this RN her father had arrived safely.

## 2015-04-03 ENCOUNTER — Ambulatory Visit: Payer: Medicare Other

## 2015-04-03 ENCOUNTER — Ambulatory Visit
Admission: RE | Admit: 2015-04-03 | Discharge: 2015-04-03 | Disposition: A | Discharge status: Home / Self Care | Payer: Medicare Other | Source: Ambulatory Visit | Attending: Radiation Oncology | Admitting: Radiation Oncology

## 2015-04-03 ENCOUNTER — Encounter: Payer: Self-pay | Admitting: *Deleted

## 2015-04-03 DIAGNOSIS — Z510 Encounter for antineoplastic radiation therapy: Secondary | ICD-10-CM | POA: Diagnosis not present

## 2015-04-03 NOTE — Progress Notes (Signed)
  Oncology Nurse Navigator Documentation    Navigator Encounter Type: Treatment (04/03/15 0915) Patient Visit Type: Radonc (04/03/15 0915)                    Time Spent with Patient: 15 (04/03/15 0915)    Met with patient prior to today's RT.   He stated he has been very fatigued during the final days of tmt, expressed the need to recover before moving forward with RT for his prostate cancer. He has a family gathering later this Fall, would like to attend feeling the best he possibly can. I encouraged him to discuss his wishes during an upcoming appt with Dr. Tammi Klippel, he verbalized understanding.  Gayleen Orem, RN, BSN, [place] [facility] [place] [phone]

## 2015-04-04 ENCOUNTER — Encounter: Payer: Self-pay | Admitting: Radiation Oncology

## 2015-04-04 ENCOUNTER — Ambulatory Visit
Admission: RE | Admit: 2015-04-04 | Discharge: 2015-04-04 | Disposition: A | Discharge status: Home / Self Care | Payer: Medicare Other | Source: Ambulatory Visit | Attending: Radiation Oncology | Admitting: Radiation Oncology

## 2015-04-04 ENCOUNTER — Encounter: Payer: Self-pay | Admitting: *Deleted

## 2015-04-04 ENCOUNTER — Ambulatory Visit: Payer: Medicare Other

## 2015-04-04 VITALS — BP 111/54 | HR 87 | Temp 98.1°F | Resp 16 | Wt 208.1 lb

## 2015-04-04 DIAGNOSIS — K1233 Oral mucositis (ulcerative) due to radiation: Secondary | ICD-10-CM

## 2015-04-04 DIAGNOSIS — Z510 Encounter for antineoplastic radiation therapy: Secondary | ICD-10-CM | POA: Diagnosis not present

## 2015-04-04 DIAGNOSIS — C320 Malignant neoplasm of glottis: Secondary | ICD-10-CM

## 2015-04-04 DIAGNOSIS — R1310 Dysphagia, unspecified: Secondary | ICD-10-CM

## 2015-04-04 MED ORDER — HYDROCODONE-ACETAMINOPHEN 7.5-325 MG/15ML PO SOLN: 5.0000 mL | Freq: Four times a day (QID) | ORAL | Status: DC | PRN

## 2015-04-04 NOTE — Progress Notes (Signed)
  Oncology Nurse Navigator Documentation    Navigator Encounter Type: Treatment (04/04/15 0930) Patient Visit Type: Radonc (04/04/15 0930) Treatment Phase: Final Radiation Tx;Other (04/04/15 0930) Barriers/Navigation Needs: No barriers at this time (04/04/15 0930)                Time Spent with Patient: 45 (04/04/15 0930)    Met with pt during final RT to offer support and to celebrate end of radiation treatment, joined him for weekly UT with Dr. Tammi Klippel.   1. I provided post-RT guidance:  Importance of protecting treatment area from sun.  Continuation of Biafine application 2-3 times daily. 2. I explained that my role as navigator will continue for several more months and that I will be calling and/or joining him during follow-up visits.   3. He verbalized understanding that RT for prostate cancer won't start for at least 3 months, at that time will be dependent his readiness. 4. He did not express any needs or concerns at this time, I encouraged him to contact me if that changes, he verbalized understanding.   Gayleen Orem, RN, BSN, [place] [facility] [place] [phone]

## 2015-04-04 NOTE — Progress Notes (Signed)
Weight and vitals stable. Hoarseness continues. Requesting refill of Hycet. Reports pain and difficulty associated with swallowing. Hyperpigmentation with dry desquamation of anterior neck noted. Reports using Biafine bid as directed. Understands to continues biafine bid for the next two weeks. Reports dizziness continues upon standing. Reports he is eating slightly better. One month follow up appointment card given.    BP 131/76 mmHg  Pulse 88  Temp(Src) 98.1 F (36.7 C) (Oral)  Resp 16  Wt 208 lb 1.6 oz (94.394 kg)  SpO2 99% Wt Readings from Last 3 Encounters:  04/04/15 208 lb 1.6 oz (94.394 kg)  04/01/15 206 lb 1.6 oz (93.486 kg)  03/29/15 206 lb 12.8 oz (93.804 kg)

## 2015-04-04 NOTE — Progress Notes (Signed)
  Radiation Oncology         [phone]   Name: Timothy Suarez MRN: [id number]   Date: 04/04/2015  DOB: 1924-10-07     Weekly Radiation Therapy Management    ICD-9-CM ICD-10-CM   1. 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 Mucositis due to radiation therapy 528.01 K12.33 HYDROcodone-acetaminophen (HYCET) 7.5-325 mg/15 ml solution   E879.2    3. Odynophagia 787.20 R13.10 HYDROcodone-acetaminophen (HYCET) 7.5-325 mg/15 ml solution   Diagnosis: Timothy Suarez is a 81 year old gentleman presenting to clinic in regards to his stage T1a squamous cell carcinoma of the right true vocal cord.  Current Dose: 63 Gy  Planned Dose:  63 Gy  Narrative The patient presents for routine under treatment assessment. Weight and vitals stable. Hoarseness continues. Requesting refill of Hycet. Reports pain and difficulty associated with swallowing. Hyperpigmentation with dry desquamation of anterior neck noted. Reports using Biafine bid as directed. Understands to continues biafine bid for the next two weeks. Reports dizziness continues upon standing. Reports he is eating slightly better. One month follow up appointment card given.  Set-up films were reviewed and the chart was checked.  Physical Findings  weight is 208 lb 1.6 oz (94.394 kg). His oral temperature is 98.1 F (36.7 C). His blood pressure is 111/54 and his pulse is 87. His respiration is 16 and oxygen saturation is 99%.   Wt Readings from Last 3 Encounters:  04/04/15 208 lb 1.6 oz (94.394 kg)  04/01/15 206 lb 1.6 oz (93.486 kg)  03/29/15 206 lb 12.8 oz (93.804 kg)    Impression Timothy Suarez is a 81 year old gentleman presenting to clinic in regards to his stage T1a squamous cell carcinoma of the right true vocal cord. The patient is tolerating radiation.  Plan The patient has completed radiation treatment. We discussed radiation aftercare. I  have refilled his Hycet prescription. We will follow up in one month.    This document serves as a record of services personally performed by Tyler Pita, MD. It was created on his behalf by Arlyce Harman, a trained medical scribe. The creation of this record is based on the scribe's personal observations and the provider's statements to them. This document has been checked and approved by the attending provider.    ________________________________________   Sheral Apley. Tammi Klippel, M.D.

## 2015-04-05 ENCOUNTER — Ambulatory Visit: Payer: Medicare Other

## 2015-04-08 ENCOUNTER — Ambulatory Visit: Payer: Medicare Other | Admitting: Cardiology

## 2015-04-16 NOTE — Progress Notes (Deleted)
°  Radiation Oncology         [phone] ________________________________  Name: Timothy Suarez MRN: [id number]  Date: 04/04/2015  DOB: 27-Aug-1924  End of Treatment Note   ICD-9-CM ICD-10-CM    1. 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     Indication for treatment:  Curative, Larynx Preservation       Radiation treatment dates:   02/25/2015-04/04/2015  Site/dose:  The glottic larynx was treated using standard rectangular portals 63 Gy in 28 fractions  Beams/energy:   A thermoplastic mask was used for immobilization and daily IGRT was done with CB-CT.  Lateral 6 MV X-rays were used.  Shown in red is the PTV and one of the representative fields.    Narrative: The patient tolerated radiation treatment relatively well.   Hoarseness continued. For pain, used Hycet. Reported pain and difficulty associated with swallowing. Hyperpigmentation with dry desquamation of anterior neck noted. Used Biafine bid as directed. Understands to continues biafine bid for the next two weeks.  Plan: The patient has completed radiation treatment. The patient will return to radiation oncology clinic for routine followup in one month. I advised him to call or return sooner if he has any questions or concerns related to his recovery or treatment. ________________________________  Sheral Apley. Tammi Klippel, M.D.

## 2015-04-18 ENCOUNTER — Other Ambulatory Visit: Payer: Self-pay | Admitting: Internal Medicine

## 2015-04-26 ENCOUNTER — Other Ambulatory Visit: Payer: Self-pay | Admitting: Radiation Oncology

## 2015-04-26 DIAGNOSIS — C320 Malignant neoplasm of glottis: Secondary | ICD-10-CM

## 2015-04-26 DIAGNOSIS — R1310 Dysphagia, unspecified: Secondary | ICD-10-CM

## 2015-04-26 DIAGNOSIS — K1233 Oral mucositis (ulcerative) due to radiation: Secondary | ICD-10-CM

## 2015-04-26 MED ORDER — HYDROCODONE-ACETAMINOPHEN 7.5-325 MG/15ML PO SOLN: 5.0000 mL | Freq: Four times a day (QID) | ORAL | Status: DC | PRN

## 2015-04-30 ENCOUNTER — Telehealth: Payer: Self-pay | Admitting: Radiation Oncology

## 2015-04-30 MED ORDER — HYDROCODONE-ACETAMINOPHEN 7.5-325 MG/15ML PO SOLN: 10.0000 mL | ORAL | Status: DC | PRN

## 2015-04-30 NOTE — Telephone Encounter (Signed)
Phoned patient's daughter, [name]. Explained her father's Hycet script is ready for pick up in the rad onc nursing station. Explained that whom ever picked up the script would need to bring in their driver's license for identification purposes. Understanding verbalized.

## 2015-05-01 ENCOUNTER — Telehealth: Payer: Self-pay | Admitting: Radiation Oncology

## 2015-05-01 NOTE — Telephone Encounter (Signed)
Returned message left by [employer] [facility] [employer] on [employer]. Clarified that Dr. Tammi Klippel wants the patient to have 5-15 cc every four hours prn and 473 cc of Hycet total dispensed.

## 2015-05-02 ENCOUNTER — Encounter: Payer: Self-pay | Admitting: Internal Medicine

## 2015-05-02 ENCOUNTER — Ambulatory Visit (INDEPENDENT_AMBULATORY_CARE_PROVIDER_SITE_OTHER): Payer: Medicare Other | Admitting: Internal Medicine

## 2015-05-02 VITALS — BP 118/70 | HR 84 | Temp 98.5°F | Resp 20 | Ht 72.0 in | Wt 207.4 lb

## 2015-05-02 DIAGNOSIS — I482 Chronic atrial fibrillation, unspecified: Secondary | ICD-10-CM

## 2015-05-02 DIAGNOSIS — Z23 Encounter for immunization: Secondary | ICD-10-CM | POA: Diagnosis not present

## 2015-05-02 DIAGNOSIS — I2510 Atherosclerotic heart disease of native coronary artery without angina pectoris: Secondary | ICD-10-CM | POA: Diagnosis not present

## 2015-05-02 DIAGNOSIS — F329 Major depressive disorder, single episode, unspecified: Secondary | ICD-10-CM

## 2015-05-02 DIAGNOSIS — C61 Malignant neoplasm of prostate: Secondary | ICD-10-CM | POA: Diagnosis not present

## 2015-05-02 DIAGNOSIS — C320 Malignant neoplasm of glottis: Secondary | ICD-10-CM | POA: Diagnosis not present

## 2015-05-02 DIAGNOSIS — F32A Depression, unspecified: Secondary | ICD-10-CM

## 2015-05-02 DIAGNOSIS — G3184 Mild cognitive impairment, so stated: Secondary | ICD-10-CM

## 2015-05-02 NOTE — Patient Instructions (Signed)
Call your cardiology office about how you changed metoprolol back to norvasc so they know what you did and why.  You could increase the metoprolol dose to deal with the high blood pressure.

## 2015-05-02 NOTE — Progress Notes (Signed)
Location:  [facility]  Code Status: DNR Goals of Care: Advanced Directive information Does patient have an advance directive?: Yes, Type of Advance Directive: Living will, Does patient want to make changes to advanced directive?: No - Patient declined   Chief Complaint  Patient presents with  . Medical Management of Chronic Issues    getting over chemo for throat cancer and has prostate cancer also    HPI: Patient is a 81 y.o. male seen in the office today for management of chronic issues.   He has had a difficult time with his diagnoses of prostate and larygeal cancer. He has completed radiation tx for his laryngeal ca. He didn't need chemotherapy, only radiation for 28 days. It wiped him out. He hasn't had much trouble eating, and his voice is returning.   He is trying to decide about having his treatment for prostate ca, they have recommended surgery and radiation tx. He has read there is no change in 10 year life expectancy of these treatments vs doing nothing. He is not currently having any increased trouble voiding his urine.   HTN is well controlled. He was put on Toprol by Richardson Dopp, cardiology PA, and instructed to stop amlodipine. He took it for about a week and then stopped the Toprol and resumed amlodipine due to his blood pressure increasing.   He stopped taking the Wellbutrin because it made him weak. He feels like he can manage his anxiety on his own.  He is still having dizziness at times. He has started working out again and this has helped. He feels like he can manage it so he has quit trying to find out what is causing it.   He gets ear wax impaction sometimes. Also has history of ear drum implant. He has debrox he can use at home. He declines physical exam of ears today.    Review of Systems:  Review of Systems  Constitutional: Negative.  Negative for weight loss and malaise/fatigue.  HENT:       Wax impaction    Respiratory: Negative for cough, hemoptysis and sputum production.   Cardiovascular: Positive for palpitations. Negative for chest pain.  Gastrointestinal: Negative for heartburn, nausea and vomiting.  Genitourinary: Negative for dysuria and hematuria.       Difficulty initiating stream  Musculoskeletal: Negative for myalgias.  Neurological: Positive for dizziness.  Psychiatric/Behavioral: Negative for depression. The patient is nervous/anxious.     Past Medical History  Diagnosis Date  . MVP (mitral valve prolapse)     S/P Rt mini thoractomy for Mitral Valve repair  . Paroxysmal atrial fibrillation     S/P Maze procedure  . HTN (hypertension)   . Depression   . Hernia   . Aortic valve insufficiency, acquired   . Dyslipidemia   . Glaucoma   . BPH (benign prostatic hyperplasia)   . Colon cancer     colon s/p partial colectomy  . Heart valve replaced   . A-fib   . Cerebral ischemia   . Sleep disturbance   . Mild cognitive impairment   . Thoracic aortic aneurysm   . Major depression   . Hyperlipidemia   . History of echocardiogram     Echo 5/16:  Mild LVH, EF 50-55%, Gr 1 DD, septal HK, Ao sclerosis without stenosis, mild AI, MV repair ok with borderline mild MS (mean 4 mmHg), mild MR, mild LAE, mod RAE, mild TR, PASP 30 mmHg  . Hx  of cardiovascular stress test     Lexiscan Myoview 5/16:  Apical thinning, EF not gated, no ischemia. Low Risk  . Coronary artery disease     50-70% LAD mitral valve repair 4+ yrs ago  . Prostate cancer   . Cancer     invasive squamous cell carcinoma of right vocal cord    Past Surgical History  Procedure Laterality Date  . Flexhd patch repair of chest wall hernia.  01/28/11    [name]  . Colectomy  2004    Dr [name]  . Tympanoplasty    . Mitral valve repair  12/27/2008    complex valvuloplasty with 75mm Memo 3D annuloplasty ring via right minithoracotomy  . Maze  12/27/2008    left side lesion set  . Mitral valve repair      minimally  invasive  . Tee without cardioversion N/A 01/03/2013    Procedure: TRANSESOPHAGEAL ECHOCARDIOGRAM (TEE);  Surgeon: Sueanne Margarita, MD;  Location: [place];  Service: Cardiovascular;  Laterality: N/A;  . Total knee arthroplasty Right 2003  . Microlaryngoscopy Right 01/29/2015    Procedure: MICROLARYNGOSCOPY WITH BIOSPY OF RIGHT VOCAL CORD;  Surgeon: Izora Gala, MD;  Location: [place];  Service: ENT;  Laterality: Right;  . Prostate biopsy      Allergies  Allergen Reactions  . Citalopram Nausea Only  . Trazodone And Nefazodone Other (See Comments)    Dry mouth   Medications: Patient's Medications  New Prescriptions   No medications on file  Previous Medications   ALUM & MAG HYDROXIDE-SIMETH (MAGIC MOUTHWASH W/LIDOCAINE) SOLN    Take 5 mLs by mouth 4 (four) times daily as needed for mouth pain.   ASPIRIN EC 81 MG TABLET    Take 1 tablet (81 mg total) by mouth daily.   ATORVASTATIN (LIPITOR) 40 MG TABLET    Take 1 tablet (40 mg total) by mouth daily at 6 PM. For cholesterol   BUPROPION (WELLBUTRIN XL) 150 MG 24 HR TABLET    Take 1 tablet (150 mg total) by mouth daily.   CHOLECALCIFEROL (VITAMIN D) 2000 UNITS CAPS    Take 1 capsule (2,000 Units total) by mouth daily.   METOPROLOL SUCCINATE (TOPROL-XL) 25 MG 24 HR TABLET    Take 0.5 tablets (12.5 mg total) by mouth daily.   TRAVOPROST, BENZALKONIUM, (TRAVATAN) 0.004 % OPHTHALMIC SOLUTION    Place 1 drop into both eyes at bedtime.     ZOLPIDEM (AMBIEN) 5 MG TABLET    TAKE 1 TABLET BY MOUTH EVERY NIGHT AT BEDTIME AS NEEDED FOR SLEEP  Modified Medications   No medications on file  Discontinued Medications   HYDROCODONE-ACETAMINOPHEN (HYCET) 7.5-325 MG/15 ML SOLUTION    Take 5-15 mLs by mouth 4 (four) times daily as needed for moderate pain.   HYDROCODONE-ACETAMINOPHEN (HYCET) 7.5-325 MG/15 ML SOLUTION    Take 10 mLs by mouth every 4 (four) hours as needed for moderate pain. 5-15 ml po every 4-6 hours prn dispense qty of 473 cc no refills     Physical Exam: Filed Vitals:   05/02/15 1110  BP: 118/70  Pulse: 84  Temp: 98.5 F (36.9 C)  TempSrc: Oral  Resp: 20  Height: 6' (1.829 m)  Weight: 207 lb 6.4 oz (94.076 kg)  SpO2: 96%   Physical Exam  HENT:  HOH, hearing aids in place  Cardiovascular: Normal rate, regular rhythm and intact distal pulses.   Pulmonary/Chest: Effort normal and breath sounds normal.  Abdominal: Soft. Bowel sounds are normal. He  exhibits no distension. There is no tenderness.  Musculoskeletal: Normal range of motion.  Neurological: He is alert. Coordination normal.  Skin: Skin is warm and dry.  Hyperpigmentation on neck r/t rad tx  Psychiatric: He has a normal mood and affect. His behavior is normal.    Labs reviewed: Basic Metabolic Panel:  Recent Labs  01/29/15 0758  NA 138  K 3.7  CL 110  CO2 22  GLUCOSE 106*  BUN 13  CREATININE 1.23  CALCIUM 8.7*   Liver Function Tests: No results for input(s): AST, ALT, ALKPHOS, BILITOT, PROT, ALBUMIN in the last 8760 hours. No results for input(s): LIPASE, AMYLASE in the last 8760 hours. No results for input(s): AMMONIA in the last 8760 hours. CBC:  Recent Labs  01/29/15 0758  WBC 8.5  HGB 15.3  HCT 44.9  MCV 95.1  PLT 151   Lipid Panel: No results for input(s): CHOL, HDL, LDLCALC, TRIG, CHOLHDL, LDLDIRECT in the last 8760 hours. Lab Results  Component Value Date   HGBA1C  January 16, 202010    5.3 (NOTE) The ADA recommends the following therapeutic goal for glycemic control related to Hgb A1c measurement: Goal of therapy: <6.5 Hgb A1c  Reference: American Diabetes Association: Clinical Practice Recommendations 2010, Diabetes Care, 2010, 33: (Suppl  1).    Procedures since last visit:  Assessment/Plan 1. Prostate cancer -He has consulted with Dr. Tammi Klippel re: radiation therapy.  -He seems to have decided he wants to delay any treatment at this point as the radiation from the vocal cord carcinoma made him very tired. -Currently,  he states he is asymptomatic, but understands that without treatment he may become more symptomatic, and if the cancer was to spread there could be adverse effects from that as well. -Will continue to monitor for symptoms of progression and quality of life impacts.  2. 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 Depression -He has stopped his Wellbutrin -Managing well on his own without medication -No suicidal ideations -Will monitor for signs of worsening depression  4. Chronic atrial fibrillation -Stable -Followed by cardiology -He stopped his metoprolol on his own -Advised him to call cardiologist and discuss his concerns about the medications and advise on what he should take  - CBC with Differential/Platelet; Future  5. Coronary artery disease involving native coronary artery of native heart without angina pectoris -Followed by cardiology -Advised him to call re: stopping metoprolol and restarting amlodipine - CBC with Differential/Platelet; Future - Comprehensive metabolic panel; Future - Lipid panel; Future  6. Mild cognitive impairment -He continues to function at a high level -He is still driving, will need to monitor this  7. Need for prophylactic vaccination and inoculation against influenza -Administered today in office - Flu Vaccine QUAD 36+ mos PF IM (Fluarix & Fluzone Quad PF)  Labs/tests ordered: Next appt: 3 months with labs with Dr. Mariea Clonts for medical management of chronic illnesses.  Mariana Kaufman, RN, BSN [employer]- Nurse Practitioner- [facility] [address], [place] [address] Office Phone:  [phone] Office Fax:  [phone]

## 2015-05-09 ENCOUNTER — Ambulatory Visit
Admission: RE | Admit: 2015-05-09 | Discharge: 2015-05-09 | Disposition: A | Discharge status: Home / Self Care | Payer: Medicare Other | Source: Ambulatory Visit | Attending: Radiation Oncology | Admitting: Radiation Oncology

## 2015-05-09 ENCOUNTER — Encounter: Payer: Self-pay | Admitting: Radiation Oncology

## 2015-05-09 VITALS — BP 133/81 | HR 84 | Resp 18 | Wt 208.0 lb

## 2015-05-09 DIAGNOSIS — C61 Malignant neoplasm of prostate: Secondary | ICD-10-CM

## 2015-05-09 NOTE — Progress Notes (Signed)
Weight and vitals stable. Ambulating today with the aid of a cane. Reports his stamina and energy level have greatly improved. Here today to discuss pursuing radiation to the prostate. Patient expresses he isn't sure he wants prostate treatment. Reports pain associated with swallowing has resolved. Reports he is eating whatever he likes without difficulty now. SOB with exertion continues. Hoarseness is improving.   BP 133/81 mmHg  Pulse 84  Resp 18  Wt 208 lb (94.348 kg)  SpO2 97% Wt Readings from Last 3 Encounters:  05/09/15 208 lb (94.348 kg)  05/02/15 207 lb 6.4 oz (94.076 kg)  04/04/15 208 lb 1.6 oz (94.394 kg)

## 2015-05-09 NOTE — Progress Notes (Signed)
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    ICD-9-CM ICD-10-CM   1. Prostate cancer 185 C61     Interval Since Last Radiation:  1  months  Narrative:  The patient returns today for routine follow-up.  Weight and vitals stable. Ambulating today with the aid of a cane. Reports his stamina and energy level have greatly improved. Here today to discuss pursuing radiation to the prostate. Patient expresses he isn't sure he wants prostate treatment. Reports pain associated with swallowing has resolved. Reports he is eating whatever he likes without difficulty now. SOB with exertion continues. Hoarseness is improving. He no longer needs the hydrocodone daily. Reports some sinus drainage, as though he has a cold. He only received one hormone shot at Dr. Sammie Bench office and reports he has no others scheduled. Reports urinary frequency and nocturia x5.                             ALLERGIES:  is allergic to citalopram and trazodone and nefazodone.  Meds: Current Outpatient Prescriptions  Medication Sig Dispense Refill  . aspirin EC 81 MG tablet Take 1 tablet (81 mg total) by mouth daily. 30 tablet 3  . atorvastatin (LIPITOR) 40 MG tablet Take 1 tablet (40 mg total) by mouth daily at 6 PM. For cholesterol 90 tablet 3  . Cholecalciferol (VITAMIN D) 2000 UNITS CAPS Take 1 capsule (2,000 Units total) by mouth daily. 30 capsule 3  . travoprost, benzalkonium, (TRAVATAN) 0.004 % ophthalmic solution Place 1 drop into both eyes at bedtime.      Marland Kitchen zolpidem (AMBIEN) 5 MG tablet TAKE 1 TABLET BY MOUTH EVERY NIGHT AT BEDTIME AS NEEDED FOR SLEEP 30 tablet 0  . Alum & Mag  Hydroxide-Simeth (MAGIC MOUTHWASH W/LIDOCAINE) SOLN Take 5 mLs by mouth 4 (four) times daily as needed for mouth pain. (Patient not taking: Reported on 05/09/2015) 400 mL 1  . HYDROcodone-acetaminophen (HYCET) 7.5-325 mg/15 ml solution     . metoprolol succinate (TOPROL-XL) 25 MG 24 hr tablet Take 0.5 tablets (12.5 mg total) by mouth daily. (Patient not taking: Reported on 05/02/2015) 30 tablet 11   No current facility-administered medications for this encounter.    Physical Findings: The patient is in no acute distress. Patient is alert and oriented.  weight is 208 lb (94.348 kg). His blood pressure is 133/81 and his pulse is 84. His respiration is 18 and oxygen saturation is 97%. .  No significant changes.  Lab Findings: Lab Results  Component Value Date   WBC 8.5 01/29/2015   WBC 10.3 04/02/2014   HGB 15.3 01/29/2015   HCT 44.9 01/29/2015   PLT 151 01/29/2015    Lab Results  Component Value Date   NA 138 01/29/2015   NA 142 04/02/2014   K 3.7 01/29/2015   CO2 22 01/29/2015   GLUCOSE 106* 01/29/2015   GLUCOSE 101* 04/02/2014   BUN 13 01/29/2015   BUN 15 04/02/2014   CREATININE 1.23 01/29/2015   BILITOT 0.4 04/02/2014   ALKPHOS 107 04/02/2014   AST 13 04/02/2014   ALT  12 04/02/2014   PROT 6.6 04/02/2014   PROT 6.6 01/26/2011   ALBUMIN 3.7 01/26/2011   CALCIUM 8.7* 01/29/2015   ANIONGAP 6 01/29/2015    Radiographic Findings: No results found.  Impression:  The patient is recovering from the effects of radiation.  At this point, management of the patient's high risk prostate cancer is also an important consideration.   Plan:  I have advised the patient to continue receiving Lupron shots with his urologist.  We will re-discuss prostate treatment in the beginning of January when he has recovered from larynx radiotherapy.   This document serves as a record of services personally performed by Tyler Pita, MD. It was created on his behalf by Arlyce Harman, a trained  medical scribe. The creation of this record is based on the scribe's personal observations and the provider's statements to them. This document has been checked and approved by the attending provider.    _____________________________________  Sheral Apley. Tammi Klippel, M.D.

## 2015-05-10 ENCOUNTER — Telehealth: Payer: Self-pay | Admitting: *Deleted

## 2015-05-10 NOTE — Telephone Encounter (Signed)
CALLED PATIENT TO INFORM OF [facility]PRON INJ. ON 06-25-15 - ARRIVAL TIME - 10:15 AM @ [place] AND HIS 30 MIIN. FU ON 08-15-15 @ 11 AM WITH DR. MANNING, LVM FOR A RETURN CALL

## 2015-05-28 ENCOUNTER — Other Ambulatory Visit: Payer: Self-pay | Admitting: *Deleted

## 2015-05-28 MED ORDER — ZOLPIDEM TARTRATE 5 MG PO TABS: ORAL_TABLET | ORAL | Status: DC

## 2015-05-28 NOTE — Telephone Encounter (Signed)
[employer] [facility] 

## 2015-06-02 NOTE — Progress Notes (Signed)
  Radiation Oncology         (336) [phone] ________________________________  Name: Timothy Suarez MRN: [id number]  Date: 04/04/2015  DOB: 1925/04/18  End of Treatment Note   ICD-9-CM ICD-10-CM    1. 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     Indication for treatment:  Curative, Larynx Preservation       Radiation treatment dates:   02/25/2015-04/04/2015  Site/dose:  The glottic larynx was treated using standard rectangular portals 63 Gy in 28 fractions  Beams/energy:   A thermoplastic mask was used for immobilization and daily IGRT was done with CB-CT.  Lateral 6 MV X-rays were used.  Shown in red is the PTV and one of the representative fields.    Narrative: The patient tolerated radiation treatment relatively well.   Hoarseness continued. For pain, used Hycet. Reported pain and difficulty associated with swallowing. Hyperpigmentation with dry desquamation of anterior neck noted. Used Biafine bid as directed. Understands to continues biafine bid for the next two weeks.  Plan: The patient has completed radiation treatment. The patient will return to radiation oncology clinic for routine followup in one month. I advised him to call or return sooner if he has any questions or concerns related to his recovery or treatment. ________________________________  Sheral Apley. Tammi Klippel, M.D.

## 2015-07-05 ENCOUNTER — Other Ambulatory Visit: Payer: Self-pay | Admitting: Internal Medicine

## 2015-07-08 ENCOUNTER — Other Ambulatory Visit: Payer: Self-pay | Admitting: *Deleted

## 2015-07-08 MED ORDER — ZOLPIDEM TARTRATE 5 MG PO TABS: ORAL_TABLET | ORAL | Status: DC

## 2015-07-08 MED ORDER — ATORVASTATIN CALCIUM 40 MG PO TABS: ORAL_TABLET | ORAL | Status: DC

## 2015-07-08 NOTE — Telephone Encounter (Signed)
[employer] [facility] 

## 2015-07-09 ENCOUNTER — Telehealth: Payer: Self-pay | Admitting: *Deleted

## 2015-07-09 NOTE — Telephone Encounter (Signed)
  Oncology Nurse Navigator Documentation   Navigator Encounter Type: Telephone;3 month (07/09/15 1450) Patient Visit Type: Follow-up (07/09/15 1450)     Placed 42-month post-tmt follow-up call to check on patient's well-being.  He reported:  Pain: denied  Nutrition/Hydration: eating and drinking without difficult  BMs: regular, denied constipation  Skin: "OK", no longer applying Biafine  Energy Level: OK but limited in activity b/c L knee is weak, painful; experiencing balance issues.  He noted it may be time to contact his orthopedist re knee replacement (he had R knee replacement about 10 years ago).  I encouraged him to do so. He asked about follow-up appts with ENT Dr. Constance Holster and with Dr. Tammi Klippel re prostate.  I indicated I wd forward his inquiry. He did not express any needs or concerns at this time, I encouraged him to contact me if that changes, he verbalized understanding.  Gayleen Orem, RN, BSN, [facility] [place] [phone]                    Time Spent with Patient: 30 (07/09/15 1450)

## 2015-07-12 ENCOUNTER — Telehealth: Payer: Self-pay | Admitting: *Deleted

## 2015-07-12 NOTE — Telephone Encounter (Signed)
  Oncology Nurse Navigator Documentation   Navigator Encounter Type: Telephone (07/12/15 1038)         Interventions: Coordination of Care (07/12/15 1038)     Received call from ENT Dr. Janeice Robinson RN [facility] Booty indicating patient has a follow-up appt w/ Dr. Constance Holster 08/20/15 1030.    Gayleen Orem, RN, BSN, [facility] [place] [phone]           Time Spent with Patient: 15 (07/12/15 1038)

## 2015-07-23 ENCOUNTER — Other Ambulatory Visit: Payer: Medicare Other

## 2015-07-25 ENCOUNTER — Ambulatory Visit: Payer: Medicare Other | Admitting: Internal Medicine

## 2015-08-12 ENCOUNTER — Other Ambulatory Visit: Payer: Self-pay | Admitting: Internal Medicine

## 2015-08-13 ENCOUNTER — Other Ambulatory Visit: Payer: Self-pay | Admitting: *Deleted

## 2015-08-13 MED ORDER — ZOLPIDEM TARTRATE 5 MG PO TABS: ORAL_TABLET | ORAL | Status: DC

## 2015-08-13 NOTE — Telephone Encounter (Signed)
[employer] 

## 2015-08-23 ENCOUNTER — Ambulatory Visit
Admission: RE | Admit: 2015-08-23 | Discharge: 2015-08-23 | Disposition: A | Discharge status: Home / Self Care | Payer: Medicare Other | Source: Ambulatory Visit | Attending: Radiation Oncology | Admitting: Radiation Oncology

## 2015-08-23 ENCOUNTER — Encounter: Payer: Self-pay | Admitting: Radiation Oncology

## 2015-08-23 VITALS — BP 157/83 | HR 87 | Resp 18 | Wt 214.8 lb

## 2015-08-23 DIAGNOSIS — R9721 Rising PSA following treatment for malignant neoplasm of prostate: Secondary | ICD-10-CM | POA: Diagnosis not present

## 2015-08-23 DIAGNOSIS — C61 Malignant neoplasm of prostate: Secondary | ICD-10-CM

## 2015-08-23 NOTE — Progress Notes (Signed)
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 Patient being reconsulted today reference prostate cancer.  Presented to Dr. Constance Holster on 01/28/2015 with a 3 month history of hoarseness, poor appetite, fatigue, dizziness, depression, cough and memory loss.   In addition, patient was seen by Dr. Karsten Ro in 2007 for an elevated PSA but, Dr. Hollace Kinnier referred the patient back March 2016 frequency, urgency, nocturia x3 and PSA of 21.7. Today, patient reports nocturia x 4, urgency, and occasional constipation. Denies incontinence, leakage, dysuria or hematuria.       Dr. Janice Norrie discussed active surveillance, IMRT vs. Early or late androgen deprivation but, patient decided to proceed with radiation treatment for vocal cord ca first. Reports his next androgen deprivation shot is scheduled for February. Confirms these shots are scheduled every three months.  HX of radiation therapy: yes; received 63 Gy in 28 fractions for larynx preservation 02/25/15-04/04/15 Pacemaker: NO  82 year old male. Retired. Widower with two sons. Confirms his colon ca dx. In 2004 is managed with a colonoscopy every five years. Reports dizziness in the early morning (0200) which resolves after he eats breakfast. Hoarseness noted. Patient reports hoarseness is worse some days than others. Most concerned about his left knee and lack of balance related to weakness in this knee. Patient wearing a sleeve/brace on his left knee.

## 2015-08-23 NOTE — Progress Notes (Signed)
See progress note under physician encounter. 

## 2015-08-23 NOTE — Progress Notes (Signed)
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 Prostate cancer ([place]) [place]     Prior Radiation Treatment: yes; received 63 Gy in 28 fractions for larynx preservation 02/25/15-04/04/15   Narrative:  The patient returns today for reconsult visit. Previously we recommended IMRT to the prostate after androgen deprivation for 2-4 months.  We planned to treat the prostate following recovery from larynx radiotherapy.  Today, he returns to discuss that possibility.  ALLERGIES:  is allergic to citalopram and trazodone and nefazodone.  Meds: Current Outpatient Prescriptions  Medication Sig Dispense Refill  . amLODipine (NORVASC) 2.5 MG tablet     . aspirin EC 81 MG tablet Take 1 tablet (81 mg total) by mouth daily. 30 tablet 3  . atorvastatin (LIPITOR) 40 MG tablet TAKE 1 TABLET BY MOUTH DAILY AT 6PM FOR CHOLESTEROL 90 tablet 1  . Cholecalciferol (VITAMIN D) 2000 UNITS CAPS Take 1 capsule (2,000 Units total) by mouth daily. 30 capsule 3  . HYDROcodone-acetaminophen (HYCET) 7.5-325 mg/15 ml solution     . travoprost, benzalkonium, (TRAVATAN) 0.004 % ophthalmic solution Place 1 drop into both eyes at bedtime.      Marland Kitchen zolpidem (AMBIEN) 5 MG tablet Take one tablet by mouth every night at bedtime as needed for sleep 30 tablet 0  . Alum & Mag Hydroxide-Simeth (MAGIC MOUTHWASH W/LIDOCAINE) SOLN Take 5 mLs by mouth 4 (four) times daily as needed for mouth pain. (Patient not taking: Reported on 05/09/2015) 400 mL 1  . metoprolol succinate (TOPROL-XL) 25 MG  24 hr tablet Take 0.5 tablets (12.5 mg total) by mouth daily. (Patient not taking: Reported on 05/02/2015) 30 tablet 11   No current facility-administered medications for this encounter.    Physical Findings: The patient is in no acute distress. Patient is alert and oriented.  weight is 214 lb 12.8 oz (97.433 kg). His blood pressure is 157/83 and his pulse is 87. His respiration is 18 and oxygen saturation is 100%. Marland Kitchen Anterior neck shows no residual radiation changes.  No significant changes.  Lab Findings: Lab Results  Component Value Date   WBC 8.5 01/29/2015   WBC 10.3 04/02/2014   HGB 15.3 01/29/2015   HCT 44.9 01/29/2015   PLT 151 01/29/2015    Lab Results  Component Value Date   NA 138 01/29/2015   NA 142 04/02/2014   K 3.7 01/29/2015   CO2 22 01/29/2015   GLUCOSE 106* 01/29/2015   GLUCOSE 101* 04/02/2014   BUN 13 01/29/2015   BUN 15 04/02/2014   CREATININE 1.23 01/29/2015   BILITOT 0.4 04/02/2014   ALKPHOS 107 04/02/2014   AST 13 04/02/2014   ALT 12 04/02/2014   PROT 6.6 04/02/2014   PROT 6.6 01/26/2011   ALBUMIN 4.1 04/02/2014   ALBUMIN 3.7 01/26/2011   CALCIUM 8.7* 01/29/2015   ANIONGAP 6 01/29/2015    Radiographic Findings: No results found.  Impression: This is a very nice 82 year old gentleman with a history of treated early stage larynx  cancer and untreated high risk prostate cancer, other than androgen deprivation treatment. As above, my initial recommendation was to proceed with definitive prostate I am RT following recovery from larynx radiotherapy. The patient had a difficult time with his head and neck irradiation and would not be interested in prostate radiation. We talked about the pros and cons of proceeding with definitive prostate treatment now versus proceeding on as-needed basis. We also talked about the standard follow-up for larynx cancer with assessments every 2-4 months as well as the pros and cons of standard follow-up.   Plan:  The patient  expressed an understanding of the pros and cons of treatment of his prostate cancer as well as routine surveillance for history did larynx cancer. At this point, he would like to proceed on as-needed basis and understands to contact me if he has questions or concerns or develops any issues. He does not want scheduled follow-up visits or any further treatment at this time. However, he does plan to continue androgen deprivation with Dr. Karsten Ro.  _____________________________________  Sheral Apley. Tammi Klippel, M.D.   This document serves as a record of services personally performed by Tyler Pita, MD. It was created on his behalf by Jenell Milliner, a trained medical scribe. The creation of this record is based on the scribe's personal observations and the provider's statements to them. This document has been checked and approved by the attending provider.

## 2015-08-26 ENCOUNTER — Other Ambulatory Visit: Payer: Medicare Other

## 2015-08-26 DIAGNOSIS — I2510 Atherosclerotic heart disease of native coronary artery without angina pectoris: Secondary | ICD-10-CM

## 2015-08-26 DIAGNOSIS — I482 Chronic atrial fibrillation, unspecified: Secondary | ICD-10-CM

## 2015-08-27 LAB — LIPID PANEL
Chol/HDL Ratio: 2.9 ratio units (ref 0.0–5.0)
Cholesterol, Total: 144 mg/dL (ref 100–199)
HDL: 49 mg/dL (ref >39)
LDL Calculated: 68 mg/dL (ref 0–99)
Triglycerides: 135 mg/dL (ref 0–149)
VLDL Cholesterol Cal: 27 mg/dL (ref 5–40)

## 2015-08-27 LAB — COMPREHENSIVE METABOLIC PANEL
ALT: 14 IU/L (ref 0–44)
AST: 19 IU/L (ref 0–40)
Albumin/Globulin Ratio: 1.7 (ref 1.1–2.5)
Albumin: 4.2 g/dL (ref 3.5–4.7)
Alkaline Phosphatase: 97 IU/L (ref 39–117)
BUN/Creatinine Ratio: 14 (ref 10–22)
BUN: 16 mg/dL (ref 8–27)
Bilirubin Total: 0.8 mg/dL (ref 0.0–1.2)
CO2: 24 mmol/L (ref 18–29)
Calcium: 9.2 mg/dL (ref 8.6–10.2)
Chloride: 104 mmol/L (ref 96–106)
Creatinine, Ser: 1.12 mg/dL (ref 0.76–1.27)
GFR calc Af Amer: 67 mL/min/{1.73_m2} (ref >59)
GFR calc non Af Amer: 58 mL/min/{1.73_m2} — ABNORMAL LOW (ref >59)
Globulin, Total: 2.5 g/dL (ref 1.5–4.5)
Glucose: 107 mg/dL — ABNORMAL HIGH (ref 65–99)
Potassium: 4 mmol/L (ref 3.5–5.2)
Sodium: 144 mmol/L (ref 134–144)
Total Protein: 6.7 g/dL (ref 6.0–8.5)

## 2015-08-27 LAB — CBC WITH DIFFERENTIAL/PLATELET
Basophils Absolute: 0 10*3/uL (ref 0.0–0.2)
Basos: 0 %
EOS (ABSOLUTE): 0.1 10*3/uL (ref 0.0–0.4)
Eos: 2 %
Hematocrit: 42.8 % (ref 37.5–51.0)
Hemoglobin: 14.7 g/dL (ref 12.6–17.7)
Immature Grans (Abs): 0 10*3/uL (ref 0.0–0.1)
Immature Granulocytes: 0 %
Lymphocytes Absolute: 2 10*3/uL (ref 0.7–3.1)
Lymphs: 27 %
MCH: 32.4 pg (ref 26.6–33.0)
MCHC: 34.3 g/dL (ref 31.5–35.7)
MCV: 94 fL (ref 79–97)
Monocytes Absolute: 0.6 10*3/uL (ref 0.1–0.9)
Monocytes: 9 %
Neutrophils Absolute: 4.6 10*3/uL (ref 1.4–7.0)
Neutrophils: 62 %
Platelets: 186 10*3/uL (ref 150–379)
RBC: 4.54 x10E6/uL (ref 4.14–5.80)
RDW: 13.1 % (ref 12.3–15.4)
WBC: 7.3 10*3/uL (ref 3.4–10.8)

## 2015-08-29 ENCOUNTER — Ambulatory Visit (INDEPENDENT_AMBULATORY_CARE_PROVIDER_SITE_OTHER): Payer: Medicare Other | Admitting: Internal Medicine

## 2015-08-29 ENCOUNTER — Encounter: Payer: Self-pay | Admitting: Internal Medicine

## 2015-08-29 VITALS — BP 136/76 | HR 80 | Temp 97.6°F | Resp 20 | Ht 72.0 in | Wt 211.8 lb

## 2015-08-29 DIAGNOSIS — G3184 Mild cognitive impairment, so stated: Secondary | ICD-10-CM

## 2015-08-29 DIAGNOSIS — C320 Malignant neoplasm of glottis: Secondary | ICD-10-CM | POA: Diagnosis not present

## 2015-08-29 DIAGNOSIS — F32A Depression, unspecified: Secondary | ICD-10-CM

## 2015-08-29 DIAGNOSIS — C61 Malignant neoplasm of prostate: Secondary | ICD-10-CM | POA: Diagnosis not present

## 2015-08-29 DIAGNOSIS — R42 Dizziness and giddiness: Secondary | ICD-10-CM

## 2015-08-29 DIAGNOSIS — F329 Major depressive disorder, single episode, unspecified: Secondary | ICD-10-CM | POA: Diagnosis not present

## 2015-08-29 DIAGNOSIS — I482 Chronic atrial fibrillation, unspecified: Secondary | ICD-10-CM

## 2015-08-29 NOTE — Progress Notes (Signed)
Patient ID: Timothy Suarez, male   DOB: 1925/04/19, 82 y.o.   MRN: [id number]   Location: [facility] Provider: Rexene Edison. Mariea Clonts, D.O., C.M.D.  Code Status: DNR Goals of Care: Advanced Directive information Does patient have an advance directive?: Yes, Type of Advance Directive: Living will, Does patient want to make changes to advanced directive?: No - Patient declined  Chief Complaint  Patient presents with  . Medical Management of Chronic Issues    3 month follow-up for Depression , and skin cancer  . OTHER    Labs printed    HPI: Patient is a 82 y.o. male seen in the office today for med mgt of chronic diseases.  He has been through a lot since I saw him last.   Prostate cancer:   Bone scan clear. Was taking lupron shots at first.  He wanted to recover from that before considering XRT.  Shots are every three mos.  Dr. Janice Norrie has just retired.  Does not have designated urologist.    Course of XRT would be much shorter.  He is unclear about his risk of rapid metastasis.  Discussed the significant involvement of his prostate by malignant cells.  I reviewed Dr. Johny Shears note, but will discuss with him his recommendations for [name] based on his past experiences.    Laryngeal cancer:  Had a lot of fatigue with radiation here.  He was also more confused during that time.  Has a three month f/u at ENT.  Hoarseness comes and goes.  No odynophagia.  Did have some difficulty with dysphagia.  Had trouble drinking water during XRT.  Dr. Constance Holster is his ENT physician.    Depression:  Spirits are variably up and down.  His daughter says she's seen him fine and happy or down.  Gets really down when he can't talk and can't hear.  He lives at [employer] and sometimes gets frustrated by his disabilities at present.    Knees bothering him also.  Still using only a cane for balance.  He plans to see orthopedics about this.  His daughter is 10 mins away and keeps a close eye on him.  Timothy Suarez  is now close by.  Apparently, his son in law has been ill and his daughter has been occupied with that, as well.    Review of Systems:  Review of Systems  Constitutional: Positive for weight loss and malaise/fatigue. Negative for fever and chills.       Down 3 lbs from 1/13, but overall up from 206 lbs  HENT: Negative for sore throat.        Hoarseness  Eyes: Negative for blurred vision.       Glasses  Respiratory: Negative for shortness of breath and stridor.   Cardiovascular: Negative for chest pain and leg swelling.  Gastrointestinal: Negative for abdominal pain, constipation, blood in stool and melena.  Genitourinary: Negative for dysuria, urgency and frequency.       Currently denying urinary symptoms, but has previously admitted to them  Musculoskeletal: Positive for joint pain. Negative for falls.       Knees bilaterally, using cane  Neurological: Negative for dizziness, loss of consciousness and weakness.  Psychiatric/Behavioral: Positive for depression and memory loss.    Past Medical History  Diagnosis Date  . MVP (mitral valve prolapse)     S/P Rt mini thoractomy for Mitral Valve repair  . Paroxysmal atrial fibrillation (HCC)     S/P Maze procedure  . HTN (hypertension)   .  Depression   . Hernia   . Aortic valve insufficiency, acquired   . Dyslipidemia   . Glaucoma   . BPH (benign prostatic hyperplasia)   . Colon cancer [facility])     colon s/p partial colectomy  . Heart valve replaced   . A-fib ([place])   . Cerebral ischemia   . Sleep disturbance   . Mild cognitive impairment   . Thoracic aortic aneurysm ([place])   . Major depression ([place])   . Hyperlipidemia   . History of echocardiogram     Echo 5/16:  Mild LVH, EF 50-55%, Gr 1 DD, septal HK, Ao sclerosis without stenosis, mild AI, MV repair ok with borderline mild MS (mean 4 mmHg), mild MR, mild LAE, mod RAE, mild TR, PASP 30 mmHg  . Hx of cardiovascular stress test     Lexiscan Myoview 5/16:  Apical thinning, EF not  gated, no ischemia. Low Risk  . Coronary artery disease     50-70% LAD mitral valve repair 4+ yrs ago  . Prostate cancer ([place])   . 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 Flexhd patch repair of chest wall hernia.  01/28/11    [name]  . Colectomy  2004    Dr [name]  . Tympanoplasty    . Mitral valve repair  12/27/2008    complex valvuloplasty with 11mm Memo 3D annuloplasty ring via right minithoracotomy  . Maze  12/27/2008    left side lesion set  . Mitral valve repair      minimally invasive  . Tee without cardioversion N/A 01/03/2013    Procedure: TRANSESOPHAGEAL ECHOCARDIOGRAM (TEE);  Surgeon: Sueanne Margarita, MD;  Location: [facility];  Service: Cardiovascular;  Laterality: N/A;  . Total knee arthroplasty Right 2003  . Microlaryngoscopy Right 01/29/2015    Procedure: MICROLARYNGOSCOPY WITH BIOSPY OF RIGHT VOCAL CORD;  Surgeon: Izora Gala, MD;  Location: [facility];  Service: ENT;  Laterality: Right;  . Prostate biopsy      Allergies  Allergen Reactions  . Citalopram Nausea Only  . Trazodone And Nefazodone Other (See Comments)    Dry mouth      Medication List       This list is accurate as of: 08/29/15 12:04 PM.  Always use your most recent med list.               amLODipine 2.5 MG tablet  Commonly known as:  NORVASC     aspirin EC 81 MG tablet  Take 1 tablet (81 mg total) by mouth daily.     atorvastatin 40 MG tablet  Commonly known as:  LIPITOR  TAKE 1 TABLET BY MOUTH DAILY AT 6PM FOR CHOLESTEROL     travoprost (benzalkonium) 0.004 % ophthalmic solution  Commonly known as:  TRAVATAN  Place 1 drop into both eyes at bedtime.     Vitamin D 2000 units Caps  Take 1 capsule (2,000 Units total) by mouth daily.     zolpidem 5 MG tablet  Commonly known as:  AMBIEN  Take one tablet by mouth every night at bedtime as needed for sleep        Health Maintenance  Topic Date Due    . ZOSTAVAX  02/09/1987  . INFLUENZA VACCINE  03/10/2016  . TETANUS/TDAP  08/11/2023  . PNA vac Low Risk Adult  Completed    Physical Exam: Filed Vitals:  08/29/15 1126  BP: 136/76  Pulse: 80  Temp: 97.6 F (36.4 C)  TempSrc: Oral  Resp: 20  Height: 6' (1.829 m)  Weight: 211 lb 12.8 oz (96.072 kg)  SpO2: 98%   Body mass index is 28.72 kg/(m^2). Physical Exam  Constitutional: He is oriented to person, place, and time. He appears well-developed and well-nourished. No distress.  Eyes:  glasses  Neck: Neck supple. No JVD present. No tracheal deviation present. No thyromegaly present.  Remains hoarse  Cardiovascular: Intact distal pulses.   Murmur heard. irreg irreg  Pulmonary/Chest: Effort normal and breath sounds normal. No respiratory distress.  Musculoskeletal:  walking with a limp, must use arms to stand and uses cane for balance  Lymphadenopathy:    He has no cervical adenopathy.  Neurological: He is alert and oriented to person, place, and time.  Skin: Skin is warm and dry.  Psychiatric: He has a normal mood and affect.    Labs reviewed: Basic Metabolic Panel:  Recent Labs  01/29/15 0758 08/26/15 1042  NA 138 144  K 3.7 4.0  CL 110 104  CO2 22 24  GLUCOSE 106* 107*  BUN 13 16  CREATININE 1.23 1.12  CALCIUM 8.7* 9.2   Liver Function Tests:  Recent Labs  08/26/15 1042  AST 19  ALT 14  ALKPHOS 97  BILITOT 0.8  PROT 6.7  ALBUMIN 4.2   No results for input(s): LIPASE, AMYLASE in the last 8760 hours. No results for input(s): AMMONIA in the last 8760 hours. CBC:  Recent Labs  01/29/15 0758 08/26/15 1042  WBC 8.5 7.3  NEUTROABS  --  4.6  HGB 15.3  --   HCT 44.9 42.8  MCV 95.1 94  PLT 151 186   Lipid Panel:  Recent Labs  08/26/15 1042  CHOL 144  HDL 49  LDLCALC 68  TRIG 135  CHOLHDL 2.9   Lab Results  Component Value Date   HGBA1C  01-18-202010    5.3 (NOTE) The ADA recommends the following therapeutic goal for glycemic control  related to Hgb A1c measurement: Goal of therapy: <6.5 Hgb A1c  Reference: American Diabetes Association: Clinical Practice Recommendations 2010, Diabetes Care, 2010, 33: (Suppl  1).    Procedures since last visit: 12/25/14 nuclear med whole body bone scan:  There is no definite evidence of metastatic disease. Minimal increased uptake at approximately L2 posteriorly is likely degenerative. Plain films of the lumbar spine are recommended  01/03/15:  Myocardial perfusion imaging:  Normal (was having frequent PVCs) Holter monitor: PACs and PVCs, was told to stop amlodipine and start toprol xl 12.5mg  po daily (since then he is back on 2.5mg  amlodipine due to more dizziness   Assessment/Plan .1. Prostate cancer [facility]) -appears he has advanced prostate ca and might be better off going through with XRT sooner rather than waiting, but will discuss further with Dr. Tammi Klippel myself to make sure I am giving the best advice -pt and I did discussed some risk of persistent dribbling, GI side effects after XRT as risks and he claims not to be bothered by his urinary symptoms at this point (though other days he's complained extensively about them)  2. 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 Depression -waxes and wanes, not on meds and does not want meds  4. Chronic atrial fibrillation (HCC) -interestingly studies in may were not showing this -he is on baby asa only  due to very unsteady gait, confusion, other ongoing problems putting him at risk for falls -did not tolerate toprol to help control pulse  5. Dizziness and giddiness -pt's daughter, Timothy Suarez, explained that her interpretation of this is that he's actually talking about when he gets confused and he's not actually dizzy as we would think of it -not on meds for this -I have previously advised against ambien due to the risk of this increasing confusion, but he could  not sleep w/o out and does not take it regularly  6. Mild cognitive impairment -seems this was worse in the midst of his XRT -he also was receiving hdyrocodone for pain at one point which probably worsened it -has supportive family -does still drive and we will need to touch on that more next time--he is striving to maintain independence -will reassess MMSE at next visit  Labs/tests ordered:  No new; reviewed today's labs with him and Timothy Suarez and they looked great Next appt:  3 mos for annual exam  Glendale Wherry L. Haleema Vanderheyden, D.O. [facility] [address], [place] [address] Cell Phone (Mon-Fri 8am-5pm):  [phone] On Call:  [phone] & follow prompts after 5pm & weekends Office Phone:  [phone] Office Fax:  [phone]

## 2015-09-17 ENCOUNTER — Telehealth: Payer: Self-pay

## 2015-09-17 NOTE — Telephone Encounter (Signed)
Spoke with his daughter Hilda Blades, she mentioned that her dad lives in [place] [facility]. He might be interested but she would have to talk to him about it since he's dealing with his health issues at the moment. She stated that if he is interested that she would give the office a call back sometime this week

## 2015-09-23 ENCOUNTER — Other Ambulatory Visit: Payer: Self-pay

## 2015-09-23 MED ORDER — ZOLPIDEM TARTRATE 5 MG PO TABS: ORAL_TABLET | ORAL | Status: DC

## 2015-10-21 ENCOUNTER — Other Ambulatory Visit: Payer: Self-pay | Admitting: *Deleted

## 2015-10-21 MED ORDER — ZOLPIDEM TARTRATE 5 MG PO TABS: ORAL_TABLET | ORAL | Status: DC

## 2015-10-21 NOTE — Telephone Encounter (Signed)
Timothy Suarez 

## 2015-11-04 ENCOUNTER — Ambulatory Visit (INDEPENDENT_AMBULATORY_CARE_PROVIDER_SITE_OTHER): Payer: Medicare Other | Admitting: Internal Medicine

## 2015-11-04 ENCOUNTER — Encounter: Payer: Self-pay | Admitting: Internal Medicine

## 2015-11-04 VITALS — BP 120/60 | HR 84 | Temp 97.9°F | Wt 208.0 lb

## 2015-11-04 DIAGNOSIS — F329 Major depressive disorder, single episode, unspecified: Secondary | ICD-10-CM | POA: Diagnosis not present

## 2015-11-04 DIAGNOSIS — C320 Malignant neoplasm of glottis: Secondary | ICD-10-CM | POA: Diagnosis not present

## 2015-11-04 DIAGNOSIS — M7122 Synovial cyst of popliteal space [Baker], left knee: Secondary | ICD-10-CM

## 2015-11-04 DIAGNOSIS — I482 Chronic atrial fibrillation, unspecified: Secondary | ICD-10-CM

## 2015-11-04 DIAGNOSIS — C61 Malignant neoplasm of prostate: Secondary | ICD-10-CM

## 2015-11-04 DIAGNOSIS — F32A Depression, unspecified: Secondary | ICD-10-CM

## 2015-11-04 DIAGNOSIS — R5383 Other fatigue: Secondary | ICD-10-CM | POA: Diagnosis not present

## 2015-11-04 DIAGNOSIS — G3184 Mild cognitive impairment, so stated: Secondary | ICD-10-CM

## 2015-11-04 MED ORDER — SERTRALINE HCL 25 MG PO TABS: 25.0000 mg | ORAL_TABLET | Freq: Every day | ORAL | Status: DC

## 2015-11-04 NOTE — Progress Notes (Signed)
Patient ID: Timothy Suarez, male   DOB: 04/22/1925, 82 y.o.   MRN: [id number]   Location:  [facility] clinic Provider:  Jerrin Recore L. Mariea Clonts, D.O., C.M.D.  Code Status: DNR Goals of Care:  Advanced Directives 11/04/2015  Does patient have an advance directive? Yes  Type of Advance Directive -  Does patient want to make changes to advanced directive? -  Copy of advanced directive(s) in chart? Yes   Chief Complaint  Patient presents with  . Medical Management of Chronic Issues    increased confusion, daughter Jackelyn Poling    HPI: Patient is a 82 y.o. male seen today for medical management of chronic diseases.  There are concerns that he has increased periods of "dizziness" which means cloudy-headedness.  He remains independent at [facility].  The periods of confusion have recently been daily, where they had been once or twice a week before.  He's been weaker and more short of breath over the past week.    His daughter asks about perhaps restarting him on an antidepressant.  He previously did not tolerate cipro.     Past Medical History  Diagnosis Date  . MVP (mitral valve prolapse)     S/P Rt mini thoractomy for Mitral Valve repair  . Paroxysmal atrial fibrillation (HCC)     S/P Maze procedure  . HTN (hypertension)   . Depression   . Hernia   . Aortic valve insufficiency, acquired   . Dyslipidemia   . Glaucoma   . BPH (benign prostatic hyperplasia)   . Colon cancer [facility])     colon s/p partial colectomy  . Heart valve replaced   . A-fib ([place])   . Cerebral ischemia   . Sleep disturbance   . Mild cognitive impairment   . Thoracic aortic aneurysm ([place])   . Major depression ([place])   . Hyperlipidemia   . History of echocardiogram     Echo 5/16:  Mild LVH, EF 50-55%, Gr 1 DD, septal HK, Ao sclerosis without stenosis, mild AI, MV repair ok with borderline mild MS (mean 4 mmHg), mild MR, mild LAE, mod RAE, mild TR, PASP 30 mmHg  . Hx of cardiovascular stress test     Lexiscan Myoview  5/16:  Apical thinning, EF not gated, no ischemia. Low Risk  . Coronary artery disease     50-70% LAD mitral valve repair 4+ yrs ago  . Prostate cancer ([place])   . 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 Flexhd patch repair of chest wall hernia.  01/28/11    [name]  . Colectomy  2004    Dr [name]  . Tympanoplasty    . Mitral valve repair  12/27/2008    complex valvuloplasty with 37mm Memo 3D annuloplasty ring via right minithoracotomy  . Maze  12/27/2008    left side lesion set  . Mitral valve repair      minimally invasive  . Tee without cardioversion N/A 01/03/2013    Procedure: TRANSESOPHAGEAL ECHOCARDIOGRAM (TEE);  Surgeon: Sueanne Margarita, MD;  Location: [place];  Service: Cardiovascular;  Laterality: N/A;  . Total knee arthroplasty Right 2003  . Microlaryngoscopy Right 01/29/2015    Procedure: MICROLARYNGOSCOPY WITH BIOSPY OF RIGHT VOCAL CORD;  Surgeon: Izora Gala, MD;  Location: [place];  Service: ENT;  Laterality: Right;  . Prostate biopsy      Allergies  Allergen Reactions  . Citalopram  Nausea Only  . Trazodone And Nefazodone Other (See Comments)    Dry mouth      Medication List       This list is accurate as of: 11/04/15  3:01 PM.  Always use your most recent med list.               amLODipine 2.5 MG tablet  Commonly known as:  NORVASC     atorvastatin 40 MG tablet  Commonly known as:  LIPITOR  TAKE 1 TABLET BY MOUTH DAILY AT 6PM FOR CHOLESTEROL     travoprost (benzalkonium) 0.004 % ophthalmic solution  Commonly known as:  TRAVATAN  Place 1 drop into both eyes at bedtime.     zolpidem 5 MG tablet  Commonly known as:  AMBIEN  Take one tablet by mouth every night at bedtime as needed for sleep       Review of Systems:  Review of Systems  Constitutional: Positive for malaise/fatigue. Negative for fever and chills.  HENT: Positive for hearing loss. Negative for  congestion.        Hoarseness  Eyes: Negative for blurred vision.       Glasses  Respiratory: Negative for cough and shortness of breath.   Cardiovascular: Negative for chest pain, palpitations and leg swelling.  Gastrointestinal: Negative for abdominal pain, constipation, blood in stool and melena.  Genitourinary: Positive for frequency. Negative for dysuria.       Nocturia  Musculoskeletal: Positive for joint pain. Negative for falls.       Unsteady; left posterior knee pain  Skin: Negative for rash.  Neurological: Positive for weakness. Negative for dizziness, loss of consciousness and headaches.  Psychiatric/Behavioral: Positive for depression and memory loss. The patient is nervous/anxious. The patient does not have insomnia.     Health Maintenance  Topic Date Due  . ZOSTAVAX  02/09/1987  . INFLUENZA VACCINE  03/10/2016  . TETANUS/TDAP  08/11/2023  . PNA vac Low Risk Adult  Completed    Physical Exam: Filed Vitals:   11/04/15 1434  BP: 120/60  Pulse: 84  Temp: 97.9 F (36.6 C)  TempSrc: Oral  Weight: 208 lb (94.348 kg)  SpO2: 98%   Body mass index is 28.2 kg/(m^2). Physical Exam  Constitutional: He is oriented to person, place, and time. He appears well-developed and well-nourished. No distress.  Neck:  Hoarseness of voice  Cardiovascular:  irreg irreg  Pulmonary/Chest: Effort normal and breath sounds normal. No respiratory distress.  Musculoskeletal: Normal range of motion. He exhibits tenderness. He exhibits no edema.  Left posterior knee with cyst that is tender; ambulates with cane and with left foot turned laterally  Neurological: He is alert and oriented to person, place, and time. No cranial nerve deficit.  Skin: Skin is warm and dry. There is pallor.  Psychiatric: He has a normal mood and affect.    Labs reviewed: Basic Metabolic Panel:  Recent Labs  01/29/15 0758 08/26/15 1042  NA 138 144  K 3.7 4.0  CL 110 104  CO2 22 24  GLUCOSE 106* 107*    BUN 13 16  CREATININE 1.23 1.12  CALCIUM 8.7* 9.2   Liver Function Tests:  Recent Labs  08/26/15 1042  AST 19  ALT 14  ALKPHOS 97  BILITOT 0.8  PROT 6.7  ALBUMIN 4.2   No results for input(s): LIPASE, AMYLASE in the last 8760 hours. No results for input(s): AMMONIA in the last 8760 hours. CBC:  Recent Labs  01/29/15 0758 08/26/15  1042  WBC 8.5 7.3  NEUTROABS  --  4.6  HGB 15.3  --   HCT 44.9 42.8  MCV 95.1 94  PLT 151 186   Lipid Panel:  Recent Labs  08/26/15 1042  CHOL 144  HDL 49  LDLCALC 68  TRIG 135  CHOLHDL 2.9   Lab Results  Component Value Date   HGBA1C  February 17, 202010    5.3 (NOTE) The ADA recommends the following therapeutic goal for glycemic control related to Hgb A1c measurement: Goal of therapy: <6.5 Hgb A1c  Reference: American Diabetes Association: Clinical Practice Recommendations 2010, Diabetes Care, 2010, 33: (Suppl  1).    Assessment/Plan 1. Other fatigue - will r/o vitamin deficiencies, anemia and electrolyte deficiencies as potential causes -b12/folate and vitamin D  - CBC with Differential/Platelet - Comprehensive metabolic panel  2. Mild cognitive impairment -seems this may be worsening, but he remains functionally independent; however, admits to worsening of his memory during certain tasks like his taxes, for example (I was surprised that he is doing these on his own)  3. Prostate cancer [facility]) -has decided to hold off on XRT due to his fatigue and exhaustion -felt that his disease is unlikely to metastasize soon enough to warrant immediate intervention  4. 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 Depression -agrees to try low dose zoloft for his mood -discussed that dose can be increased if he tolerates it well  6. Chronic atrial fibrillation (HCC) -not on anticoagulation at his choice -no difficulties with rate control  7.  Left baker's cyst -will refer to  IR for drainage of cyst if pain persists after participation in the exercise program at [facility] for balance  Labs/tests ordered:   Orders Placed This Encounter  Procedures  . CBC with Differential/Platelet  . Comprehensive metabolic panel  . B12 and Folate Panel  . Vitamin D, 25-hydroxy    Next appt:  12/09/2015   [place] Meikle L. Mayukha Symmonds, D.O. [facility] [address], [place] [address] Cell Phone (Mon-Fri 8am-5pm):  [phone] On Call:  [phone] & follow prompts after 5pm & weekends Office Phone:  [phone] Office Fax:  [phone]

## 2015-11-05 LAB — CBC WITH DIFFERENTIAL/PLATELET
Basophils Absolute: 0 10*3/uL (ref 0.0–0.2)
Basos: 0 %
EOS (ABSOLUTE): 0.1 10*3/uL (ref 0.0–0.4)
Eos: 1 %
Hematocrit: 40.7 % (ref 37.5–51.0)
Hemoglobin: 14.1 g/dL (ref 12.6–17.7)
Immature Grans (Abs): 0 10*3/uL (ref 0.0–0.1)
Immature Granulocytes: 0 %
Lymphocytes Absolute: 2 10*3/uL (ref 0.7–3.1)
Lymphs: 21 %
MCH: 32.2 pg (ref 26.6–33.0)
MCHC: 34.6 g/dL (ref 31.5–35.7)
MCV: 93 fL (ref 79–97)
Monocytes Absolute: 0.7 10*3/uL (ref 0.1–0.9)
Monocytes: 7 %
Neutrophils Absolute: 6.5 10*3/uL (ref 1.4–7.0)
Neutrophils: 71 %
Platelets: 202 10*3/uL (ref 150–379)
RBC: 4.38 x10E6/uL (ref 4.14–5.80)
RDW: 13.3 % (ref 12.3–15.4)
WBC: 9.4 10*3/uL (ref 3.4–10.8)

## 2015-11-05 LAB — COMPREHENSIVE METABOLIC PANEL
ALT: 12 IU/L (ref 0–44)
AST: 16 IU/L (ref 0–40)
Albumin/Globulin Ratio: 1.8 (ref 1.2–2.2)
Albumin: 4.3 g/dL (ref 3.5–4.7)
Alkaline Phosphatase: 104 IU/L (ref 39–117)
BUN/Creatinine Ratio: 20 (ref 10–22)
BUN: 25 mg/dL (ref 8–27)
Bilirubin Total: 0.6 mg/dL (ref 0.0–1.2)
CO2: 22 mmol/L (ref 18–29)
Calcium: 9.4 mg/dL (ref 8.6–10.2)
Chloride: 104 mmol/L (ref 96–106)
Creatinine, Ser: 1.24 mg/dL (ref 0.76–1.27)
GFR calc Af Amer: 60 mL/min/{1.73_m2} (ref >59)
GFR calc non Af Amer: 52 mL/min/{1.73_m2} — ABNORMAL LOW (ref >59)
Globulin, Total: 2.4 g/dL (ref 1.5–4.5)
Glucose: 92 mg/dL (ref 65–99)
Potassium: 4.1 mmol/L (ref 3.5–5.2)
Sodium: 145 mmol/L — ABNORMAL HIGH (ref 134–144)
Total Protein: 6.7 g/dL (ref 6.0–8.5)

## 2015-11-05 LAB — VITAMIN D 25 HYDROXY (VIT D DEFICIENCY, FRACTURES): Vit D, 25-Hydroxy: 28.9 ng/mL — ABNORMAL LOW (ref 30.0–100.0)

## 2015-11-05 LAB — B12 AND FOLATE PANEL
Folate: 11.8 ng/mL (ref >3.0)
Vitamin B-12: 504 pg/mL (ref 211–946)

## 2015-11-06 ENCOUNTER — Encounter: Payer: Self-pay | Admitting: *Deleted

## 2015-11-06 ENCOUNTER — Telehealth: Payer: Self-pay

## 2015-11-06 DIAGNOSIS — M7122 Synovial cyst of popliteal space [Baker], left knee: Secondary | ICD-10-CM

## 2015-11-06 MED ORDER — VITAMIN D 50 MCG (2000 UT) PO CAPS: ORAL_CAPSULE | ORAL | Status: DC

## 2015-11-06 NOTE — Telephone Encounter (Signed)
Referral has been entered.

## 2015-11-06 NOTE — Telephone Encounter (Signed)
Patient's daughter [name] called to request referral for baker's cyst. I reviwed last ov note and patient was told to call if pain persist.    Please place referral order

## 2015-11-07 ENCOUNTER — Other Ambulatory Visit: Payer: Self-pay | Admitting: Internal Medicine

## 2015-11-07 ENCOUNTER — Other Ambulatory Visit: Payer: Self-pay | Admitting: *Deleted

## 2015-11-07 DIAGNOSIS — M7122 Synovial cyst of popliteal space [Baker], left knee: Secondary | ICD-10-CM

## 2015-11-12 ENCOUNTER — Ambulatory Visit
Admission: RE | Admit: 2015-11-12 | Discharge: 2015-11-12 | Disposition: A | Discharge status: Home / Self Care | Payer: Medicare Other | Source: Ambulatory Visit | Attending: Internal Medicine | Admitting: Internal Medicine

## 2015-11-12 DIAGNOSIS — M7122 Synovial cyst of popliteal space [Baker], left knee: Secondary | ICD-10-CM

## 2015-11-21 ENCOUNTER — Other Ambulatory Visit: Payer: Self-pay | Admitting: Adult Health

## 2015-12-09 ENCOUNTER — Encounter: Payer: Self-pay | Admitting: Internal Medicine

## 2015-12-09 ENCOUNTER — Telehealth: Payer: Self-pay | Admitting: *Deleted

## 2015-12-09 ENCOUNTER — Ambulatory Visit (INDEPENDENT_AMBULATORY_CARE_PROVIDER_SITE_OTHER): Payer: Medicare Other | Admitting: Internal Medicine

## 2015-12-09 VITALS — BP 130/70 | HR 77 | Temp 98.0°F | Ht 72.0 in | Wt 206.0 lb

## 2015-12-09 DIAGNOSIS — F32A Depression, unspecified: Secondary | ICD-10-CM | POA: Insufficient documentation

## 2015-12-09 DIAGNOSIS — I4581 Long QT syndrome: Secondary | ICD-10-CM

## 2015-12-09 DIAGNOSIS — F0151 Vascular dementia with behavioral disturbance: Secondary | ICD-10-CM

## 2015-12-09 DIAGNOSIS — C320 Malignant neoplasm of glottis: Secondary | ICD-10-CM | POA: Diagnosis not present

## 2015-12-09 DIAGNOSIS — M7122 Synovial cyst of popliteal space [Baker], left knee: Secondary | ICD-10-CM | POA: Diagnosis not present

## 2015-12-09 DIAGNOSIS — M7120 Synovial cyst of popliteal space [Baker], unspecified knee: Secondary | ICD-10-CM | POA: Insufficient documentation

## 2015-12-09 DIAGNOSIS — I2510 Atherosclerotic heart disease of native coronary artery without angina pectoris: Secondary | ICD-10-CM | POA: Diagnosis not present

## 2015-12-09 DIAGNOSIS — R9431 Abnormal electrocardiogram [ECG] [EKG]: Secondary | ICD-10-CM

## 2015-12-09 DIAGNOSIS — Z0000 Encounter for general adult medical examination without abnormal findings: Secondary | ICD-10-CM

## 2015-12-09 DIAGNOSIS — C61 Malignant neoplasm of prostate: Secondary | ICD-10-CM

## 2015-12-09 DIAGNOSIS — F329 Major depressive disorder, single episode, unspecified: Secondary | ICD-10-CM

## 2015-12-09 DIAGNOSIS — R5383 Other fatigue: Secondary | ICD-10-CM | POA: Diagnosis not present

## 2015-12-09 DIAGNOSIS — F0153 Vascular dementia, unspecified severity, with mood disturbance: Secondary | ICD-10-CM | POA: Insufficient documentation

## 2015-12-09 NOTE — Telephone Encounter (Signed)
-----   Message from Gayland Curry, DO sent at 12/09/2015  4:13 PM EDT ----- Please call Mr. Sikkema and ask him to come in [place] or Fri for labs.  As I read more on QT prolongation and confirmed that his meds should not be causing it, I discovered we should check BMP, Magnesium levels.  Thanks!

## 2015-12-09 NOTE — Progress Notes (Signed)
Patient ID: Timothy Suarez, male   DOB: [birth year], 82 y.o.   MRN: [id number]   Location:  [facility] clinic Provider: Logen Fowle L. Mariea Clonts, D.O., C.M.D.  Patient Care Team: Gayland Curry, DO as PCP - General (Geriatric Medicine) Sueanne Margarita, MD as Consulting Physician (Cardiology) Tyler Pita, MD as Consulting Physician (Radiation Oncology) Leota Sauers, RN as Oncology Nurse Navigator Izora Gala, MD as Consulting Physician (Otolaryngology)  Extended Emergency Contact Information Primary Emergency Contact: [place] Address: [address]          [place], [place] [address] Johnnette Litter of [place] Phone: [phone] Mobile Phone: [phone] Relation: Daughter Secondary Emergency Contact: Tor Netters [place] of [place] Phone: [phone] Relation: Daughter  Code Status: DNR Goals of Care: Advanced Directive information Advanced Directives 12/09/2015  Does patient have an advance directive? Yes  Type of Advance Directive Living will  Does patient want to make changes to advanced directive? -  Copy of advanced directive(s) in chart? Yes   Chief Complaint  Patient presents with  . Annual Exam    wellness exam  . MMSE    24/30 passed clock drawing    HPI: Patient is a 82 y.o. male seen in today for an annual wellness exam.    Had the baker's cyst aspirated and injected.  He says it was dramatically better, but now it's gradually coming back.    Seems more hoarse to me than he was before.  It's now been 6 mos since his biopsy and XRT.  He's going to go back to Dr. Tammi Klippel about it.       Depression screen [facility] 2/9 12/09/2015 11/04/2015 05/09/2015 04/04/2015 01/10/2015  Decreased Interest 0 0 0 0 0  Down, Depressed, Hopeless 0 0 0 0 0  PHQ - 2 Score 0 0 0 0 0  Altered sleeping - - - - -  Tired, decreased energy - - - - -  Change in appetite - - - - -  Feeling bad or failure about yourself  - - - - -  Trouble concentrating - - - - -  Moving slowly or  fidgety/restless - - - - -  Suicidal thoughts - - - - -  PHQ-9 Score - - - - -    Fall Risk  12/09/2015 11/04/2015 08/29/2015 08/23/2015 05/09/2015  Falls in the past year? No No No Yes No  Number falls in past yr: - - - 1 -  Injury with Fall? - - - No -  Risk for fall due to : - - - Impaired balance/gait;Impaired mobility -  Follow up - - - Falls evaluation completed;Education provided;Falls prevention discussed -  later told me he fell out of bed in the middle of the night and was all mixed up.  Hurt his right arm (skin tear).  No major injury.    MMSE - Mini Mental State Exam 12/09/2015 11/09/2014 04/02/2014  Orientation to time 3 5 4   Orientation to Place 4 5 5   Registration 3 3 3   Attention/ Calculation 4 5 3   Recall 1 1 1   Language- name 2 objects 2 2 2   Language- repeat 1 1 1   Language- follow 3 step command 3 3 3   Language- read & follow direction 1 1 1   Write a sentence 1 1 1   Copy design 1 1 1   Total score 24 28 25   passed clock (has declined since last check 11/09/14) He is aware that his memory is not getting  better.  He's aware it will get worse over time.    Health Maintenance  Topic Date Due  . ZOSTAVAX  02/09/1987  . INFLUENZA VACCINE  03/10/2016  . TETANUS/TDAP  08/11/2023  . PNA vac Low Risk Adult  Completed   Urinary incontinence?  Some urgency occasionally.  When finishes, might have to go a little bit again.  He's getting system down to handle it.  PSA had trended down for his prostate ca and now off lupron also.    Functional Status Survey: Is the patient deaf or have difficulty hearing?: Yes Does the patient have difficulty seeing, even when wearing glasses/contacts?: No Does the patient have difficulty concentrating, remembering, or making decisions?: Yes Does the patient have difficulty walking or climbing stairs?: Yes Does the patient have difficulty dressing or bathing?: No Does the patient have difficulty doing errands alone such as visiting a doctor's  office or shopping?: Yes (sometimes does not remember full details) Exercise?  Exercises when he can.  If he has no energy and can hardly walk, he's not.  Doing as much walking as he can.   Diet?  Not following special diet. No exam data presentHe is getting a new eye doctor.   Hearing:  Very HOH.  Admits this is worsening.  Going tomorrow to [place] to have hearing aides checked out. Dentition:  No difficulty.  Has not been in the last 2 years. Pain:  Pain doing better since aspiration and injection of baker's left cyst.  Right TKA was 15+ years ago.    Past Medical History  Diagnosis Date  . MVP (mitral valve prolapse)     S/P Rt mini thoractomy for Mitral Valve repair  . Paroxysmal atrial fibrillation (HCC)     S/P Maze procedure  . HTN (hypertension)   . Depression   . Hernia   . Aortic valve insufficiency, acquired   . Dyslipidemia   . Glaucoma   . BPH (benign prostatic hyperplasia)   . Colon cancer [facility])     colon s/p partial colectomy  . Heart valve replaced   . A-fib ([place])   . Cerebral ischemia   . Sleep disturbance   . Mild cognitive impairment   . Thoracic aortic aneurysm ([place])   . Major depression ([place])   . Hyperlipidemia   . History of echocardiogram     Echo 5/16:  Mild LVH, EF 50-55%, Gr 1 DD, septal HK, Ao sclerosis without stenosis, mild AI, MV repair ok with borderline mild MS (mean 4 mmHg), mild MR, mild LAE, mod RAE, mild TR, PASP 30 mmHg  . Hx of cardiovascular stress test     Lexiscan Myoview 5/16:  Apical thinning, EF not gated, no ischemia. Low Risk  . Coronary artery disease     50-70% LAD mitral valve repair 4+ yrs ago  . Prostate cancer ([place])   . 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 Flexhd patch repair of chest wall hernia.  01/28/11    [name]  . Colectomy  2004    Dr [name]  . Tympanoplasty    . Mitral valve repair  12/27/2008    complex valvuloplasty with 59mm  Memo 3D annuloplasty ring via right minithoracotomy  . Maze  12/27/2008    left side lesion set  . Mitral valve repair      minimally invasive  . Tee without cardioversion N/A 01/03/2013  Procedure: TRANSESOPHAGEAL ECHOCARDIOGRAM (TEE);  Surgeon: Sueanne Margarita, MD;  Location: [place];  Service: Cardiovascular;  Laterality: N/A;  . Total knee arthroplasty Right 2003  . Microlaryngoscopy Right 01/29/2015    Procedure: MICROLARYNGOSCOPY WITH BIOSPY OF RIGHT VOCAL CORD;  Surgeon: Izora Gala, MD;  Location: [place];  Service: ENT;  Laterality: Right;  . Prostate biopsy     Social History   Social History  . Marital Status: Widowed    Spouse Name: N/A  . Number of Children: 2  . Years of Education: 14   Occupational History  . retired Nutritional therapist     Social History Main Topics  . Smoking status: Never Smoker   . Smokeless tobacco: Never Used  . Alcohol Use: 0.0 oz/week    0 Standard drinks or equivalent per week     Comment: Rare  . Drug Use: No  . Sexual Activity: No   Other Topics Concern  . 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 Zoloft [Sertraline Hcl] Other (See Comments)    dizzy  . Citalopram Nausea Only  . Trazodone And Nefazodone Other (See Comments)    Dry mouth      Medication List       This list is accurate as of: 12/09/15  2:31 PM.  Always use your most recent med list.               amLODipine 2.5 MG tablet  Commonly known as:  NORVASC     atorvastatin 40 MG tablet  Commonly known as:  LIPITOR  TAKE 1 TABLET BY MOUTH DAILY AT 6PM FOR CHOLESTEROL     travoprost (benzalkonium) 0.004 % ophthalmic solution  Commonly known as:  TRAVATAN  Place 1 drop into both eyes at bedtime.     Vitamin D 2000 units Caps  Take one capsule by mouth once daily      zolpidem 5 MG tablet  Commonly known as:  AMBIEN  TAKE 1 TABLET BY MOUTH EVERY NIGHT AT BEDTIME AS NEEDED FOR SLEEP       Review of Systems:  ROS  Physical Exam: Filed Vitals:   12/09/15 1355  BP: 130/70  Pulse: 77  Temp: 98 F (36.7 C)  TempSrc: Oral  Height: 6' (1.829 m)  Weight: 206 lb (93.441 kg)  SpO2: 98%   Body mass index is 27.93 kg/(m^2). Physical Exam  Labs reviewed: Basic Metabolic Panel:  Recent Labs  01/29/15 0758 08/26/15 1042 11/04/15 1537  NA 138 144 145*  K 3.7 4.0 4.1  CL 110 104 104  CO2 22 24 22   GLUCOSE 106* 107* 92  BUN 13 16 25   CREATININE 1.23 1.12 1.24  CALCIUM 8.7* 9.2 9.4   Liver Function Tests:  Recent Labs  08/26/15 1042 11/04/15 1537  AST 19 16  ALT 14 12  ALKPHOS 97 104  BILITOT 0.8 0.6  PROT 6.7 6.7  ALBUMIN 4.2 4.3   No results for input(s): LIPASE, AMYLASE in the last 8760 hours. No results for input(s): AMMONIA in the last 8760 hours. CBC:  Recent Labs  01/29/15 0758 08/26/15 1042 11/04/15 1537  WBC 8.5 7.3 9.4  NEUTROABS  --  4.6 6.5  HGB 15.3  --   --   HCT 44.9 42.8 40.7  MCV 95.1 94 93  PLT 151 186 202   Lipid Panel:  Recent Labs  08/26/15 1042  CHOL 144  HDL 49  LDLCALC 68  TRIG 135  CHOLHDL 2.9   Lab Results  Component Value Date   HGBA1C  2020-08-1308    5.3 (NOTE) The ADA recommends the following therapeutic goal for glycemic control related to Hgb A1c measurement: Goal of therapy: <6.5 Hgb A1c  Reference: American Diabetes Association: Clinical Practice Recommendations 2010, Diabetes Care, 2010, 33: (Suppl  1).    Procedures: US Aspiration  11/12/2015  INDICATION: Large symptomatic painful left knee Baker cyst EXAM: US ASPIRATION MEDICATIONS: 1% lidocaine locally ANESTHESIA/SEDATION: Moderate Sedation Time:  None. COMPLICATIONS: None immediate. PROCEDURE: Informed written consent was obtained from the patient after a thorough discussion of the procedural risks, benefits and  alternatives. All questions were addressed. Maximal Sterile Barrier Technique was utilized including caps, mask, sterile gowns, sterile gloves, sterile drape, hand hygiene and skin antiseptic. A timeout was performed prior to the initiation of the procedure. Patient was positioned prone. Preliminary ultrasound performed. The large left knee Baker cyst was localized. Under sterile conditions and local anesthesia, an 18 gauge needle was advanced percutaneously under direct ultrasound into the Baker cyst. Needle position confirmed with ultrasound. Syringe aspiration yielded 60 cc clear synovial fluid. Following aspiration, 180 mg Depo-Medrol diluted in 3 cc 0.25% Marcaine (total volume 5 cc) was instilled into the Baker cyst. Needle removed. Postprocedure imaging demonstrates complete collapse of the cyst. No immediate complication. IMPRESSION: Successful ultrasound left knee Baker cyst needle aspiration and therapeutic injection as above. Electronically Signed   By: Jerilynn Mages.  Shick M.D.   On: 11/12/2015 13:21   EKG: NSR, first degree av block, prolonged QTc of 475  Assessment/Plan 1. Medicare annual wellness visit, subsequent -see hpi, he's up to date -see eye doctor and dental appt  2. Coronary artery disease involving native coronary artery of native heart without angina pectoris - EKG 12-Lead (see above) -has been stable  3. Vascular dementia with depressed mood -MMSE now down to 24/30 in a well-educated male -he is having only some minor functional difficulties, but still independent in ADLs--his daughter is helping him with appts and some of his medical info  4. Baker's cyst of knee, left -s/p aspiration and injection with benefit esp in the first week and 1/2  5. Prostate cancer [facility]) -he is not planning on any treatments here, cont watchful waiting  6. 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 Other fatigue -stable right now -seems this was worse after XRT for his right vocal cord surgery  8. Prolonged QT interval -after pt left, I decided I wanted to check bmp and magnesium so CMA calling him back to get these done -meds do not seem like usual culprits for this problem as amlodipine is a peripheral CCB  Labs/tests ordered:   Orders Placed This Encounter  Procedures  . EKG 12-Lead   Next appt:  02/06/2016 for med mgt   Mende Biswell L. Xzaiver Vayda, D.O. [facility] [address], [place] [address] Cell Phone (Mon-Fri 8am-5pm):  [phone] On Call:  [phone] & follow prompts after  5pm & weekends Office Phone:  [phone] Office Fax:  [phone]

## 2015-12-09 NOTE — Progress Notes (Signed)
Patient ID: Timothy Suarez, male   DOB: 1924/08/30, 82 y.o.   MRN: [id number] MMSE 24/30 passed clock drawing

## 2015-12-09 NOTE — Telephone Encounter (Signed)
.  left message to have patient return my call.  

## 2015-12-09 NOTE — Patient Instructions (Signed)
See eye doctor. See dentist.

## 2015-12-10 ENCOUNTER — Telehealth: Payer: Self-pay | Admitting: *Deleted

## 2015-12-10 NOTE — Telephone Encounter (Signed)
Mr. Cahn called about Karena Addison leaving a message on 12/09/2015. I scheduled Mr. Capano a lab visit for 12/13/2015.

## 2015-12-10 NOTE — Telephone Encounter (Signed)
  Oncology Nurse Navigator Documentation  Navigator Location: [facility] (12/10/15 1453) Navigator Encounter Type: Telephone (12/10/15 1453) Telephone: Lahoma Crocker Call;Appt Confirmation/Clarification (12/10/15 1453)           Treatment Phase: Post-Tx Follow-up (12/10/15 1453)     In follow-up to a call I received from Mr. Decaprio dtr yesterday afternoon and her indication he would like to see me to discuss his concerns regarding recovery since the 04/04/15 completion of XRT for vocal cord carcinoma, I spoke with him and arranged to see him this Thursday at 11:00.  He noted his dtr will join him.  Gayleen Orem, RN, BSN, [place] at [place] [phone]                           Time Spent with Patient: 15 (12/10/15 1453)

## 2015-12-12 ENCOUNTER — Encounter: Payer: Self-pay | Admitting: Adult Health

## 2015-12-12 ENCOUNTER — Encounter: Payer: Self-pay | Admitting: *Deleted

## 2015-12-12 NOTE — Progress Notes (Signed)
I briefly met with Timothy Suarez and his daughter today regarding his eligibility to be seen in survivorship for his h/o laryngeal cancer, at the request of Rick Diehl, RN-H&N Navigator.  Patient also has prostate cancer as well, that has not been treated, per the patient's wishes.   I briefly reviewed the goals and purpose of survivorship and my role in his care.  I gave he and his daughters copies of the "Life After Cancer for Every Survivor" booklet and brochures. I also gave them both my business card.   I spoke with Dr. Manning about this patient, who agrees with the below plan.   -Patient will see Dr. Rosen sometime this month (12/2015). They will make this appt.  -I will see patient in 03/2016 with TSH and laryngoscopy; he will receive his survivorship care plan at this visit.  I will also plan to re-visit his thoughts/goals of care as it pertains to his prostate and laryngeal cancers.   -Pt will see Dr. Rosen again in Nov/Dec 2017, then Dr. Manning in March/April 2018.    I will work on getting the patient scheduled for a H&N survivorship care plan visit for sometime in August.  His daughter asks that we call her for her dad's appts as he has a hard time hearing over the phone.  He is hard of hearing at baseline.   I let them know we would be in touch.     , NP Survivorship Program [facility] [phone]  

## 2015-12-13 ENCOUNTER — Other Ambulatory Visit: Payer: Medicare Other

## 2015-12-15 NOTE — Progress Notes (Signed)
  Oncology Nurse Navigator Documentation  Navigator Location: [facility] (12/12/15 1055) Navigator Encounter Type: Other (12/12/15 1055)   Abnormal Finding Date: 01/29/15 (12/12/15 1055) Confirmed Diagnosis Date: 02/15/15 (12/12/15 1055)   Treatment Initiated Date: 02/25/15 (12/12/15 1055) Patient Visit Type: Other (12/12/15 1055) Treatment Phase: Post-Tx Follow-up (12/12/15 1055)     Met with Timothy Suarez and his dtr to discuss his concerns regarding recovery since completing XRT for laryngeal cancer 04/04/15.  He expressed concern about continuing hoarseness since completing treatment, that this may be an indication the cancer has returned.  He also asked about the plan for follow-up as he moves forward.   I assured him persistent hoarseness is expected and normal at this point in his post-tmt recovery, that often it takes up to a year for is voice to return to near baseline.  He acknowledged that some days he notices less hoarseness than others and we discussed that this is an indication of improvement.  I noted that if he were to experience bleeding or extreme hoarseness he should contact ENT Dr. Constance Holster.  He and dtr voiced understanding.  I noted that I confirmed with [facility] that he had seen Dr. Constance Holster on 08/20/15 and 09/30/15 but cancelled a 10/14/15 appt which he did not recall doing.  We talked about the importance of keeping scheduled follow-up appts with his ENT for post-treatment surveillance, that these appts are key to identifying post-tmt problems.  He voiced understanding.  I encouraged his dtr to call Dr. Janeice Robinson office to schedule a follow-up with Dr. Constance Holster.  She indicated she would do so.  I invited Mike Craze, NP, to meet with Mr. Ra to discuss Survivorship.  They expressed appreciation for my support and assistance.  Gayleen Orem, RN, BSN, [place] at [place] [phone]                  Acuity: Level 2 (12/12/15 1055)   Acuity Level 2: Educational needs;Survivorship (12/12/15 1055)     Time Spent with Patient: 75 (12/12/15 1055)

## 2015-12-17 ENCOUNTER — Other Ambulatory Visit: Payer: Self-pay | Admitting: Nurse Practitioner

## 2015-12-31 DIAGNOSIS — Z8521 Personal history of malignant neoplasm of larynx: Secondary | ICD-10-CM | POA: Insufficient documentation

## 2016-01-01 ENCOUNTER — Other Ambulatory Visit: Payer: Self-pay | Admitting: Otolaryngology

## 2016-01-01 DIAGNOSIS — J384 Edema of larynx: Secondary | ICD-10-CM

## 2016-01-01 DIAGNOSIS — Z8521 Personal history of malignant neoplasm of larynx: Secondary | ICD-10-CM

## 2016-01-01 DIAGNOSIS — Z923 Personal history of irradiation: Secondary | ICD-10-CM

## 2016-01-07 ENCOUNTER — Ambulatory Visit
Admission: RE | Admit: 2016-01-07 | Discharge: 2016-01-07 | Disposition: A | Discharge status: Home / Self Care | Payer: Medicare Other | Source: Ambulatory Visit | Attending: Otolaryngology | Admitting: Otolaryngology

## 2016-01-07 DIAGNOSIS — J384 Edema of larynx: Secondary | ICD-10-CM

## 2016-01-07 DIAGNOSIS — Z8521 Personal history of malignant neoplasm of larynx: Secondary | ICD-10-CM

## 2016-01-07 DIAGNOSIS — Z923 Personal history of irradiation: Secondary | ICD-10-CM

## 2016-01-07 MED ORDER — IOPAMIDOL (ISOVUE-300) INJECTION 61%
75.0000 mL | Freq: Once | INTRAVENOUS | Status: AC | PRN
  Administered 2016-01-07: 75 mL via INTRAVENOUS

## 2016-01-13 ENCOUNTER — Other Ambulatory Visit: Payer: Self-pay | Admitting: Physician Assistant

## 2016-01-13 ENCOUNTER — Telehealth: Payer: Self-pay | Admitting: *Deleted

## 2016-01-13 ENCOUNTER — Other Ambulatory Visit: Payer: Self-pay | Admitting: Internal Medicine

## 2016-01-13 DIAGNOSIS — M7122 Synovial cyst of popliteal space [Baker], left knee: Secondary | ICD-10-CM

## 2016-01-13 NOTE — Telephone Encounter (Signed)
Spoke with Daughter, Jackelyn Poling. She stated that they just received bad news regarding her father. His throat cancer may be back. He is going to have to go get biopsy this week. She stated that they have an appointment with you on 6/29 and they will discuss the PT further at that time. Does want to go ahead with the Aspiration and injection. Order placed.

## 2016-01-13 NOTE — Telephone Encounter (Signed)
Ok to do this, but cannot continue to be every 2 months.  Recommend PT ongoing for him, too.

## 2016-01-13 NOTE — Telephone Encounter (Signed)
I'm sorry to hear that.  We can discuss the therapy at his appt.

## 2016-01-13 NOTE — Telephone Encounter (Signed)
Vickie with [facility] called and stated that patient is calling wanting another U/S Aspiration and Injection for Baker's Cyst. Last one done was on 11/12/15. Needs and order sent to them to set up and perform. Please Advise.

## 2016-01-14 ENCOUNTER — Other Ambulatory Visit: Payer: Self-pay

## 2016-01-14 MED ORDER — ZOLPIDEM TARTRATE 5 MG PO TABS: 5.0000 mg | ORAL_TABLET | Freq: Every evening | ORAL | Status: DC | PRN

## 2016-01-14 NOTE — Telephone Encounter (Signed)
RX printed given to in-office provider for signature and faxed to pharmacy

## 2016-01-15 ENCOUNTER — Ambulatory Visit: Payer: Self-pay | Admitting: Otolaryngology

## 2016-01-15 ENCOUNTER — Inpatient Hospital Stay (HOSPITAL_COMMUNITY)
Admission: RE | Admit: 2016-01-15 | Discharge: 2016-01-22 | DRG: 013 | Disposition: A | Discharge status: Home / Self Care | Payer: Medicare Other | Source: Ambulatory Visit | Attending: Otolaryngology | Admitting: Otolaryngology

## 2016-01-15 ENCOUNTER — Inpatient Hospital Stay (HOSPITAL_COMMUNITY): Payer: Medicare Other

## 2016-01-15 ENCOUNTER — Inpatient Hospital Stay (HOSPITAL_COMMUNITY): Payer: Medicare Other | Admitting: Anesthesiology

## 2016-01-15 ENCOUNTER — Encounter (HOSPITAL_COMMUNITY)
Admission: RE | Disposition: A | Discharge status: Home / Self Care | Payer: Self-pay | Source: Ambulatory Visit | Attending: Otolaryngology

## 2016-01-15 ENCOUNTER — Encounter (HOSPITAL_COMMUNITY): Payer: Self-pay | Admitting: *Deleted

## 2016-01-15 DIAGNOSIS — Z9002 Acquired absence of larynx: Secondary | ICD-10-CM

## 2016-01-15 DIAGNOSIS — M7122 Synovial cyst of popliteal space [Baker], left knee: Secondary | ICD-10-CM | POA: Diagnosis present

## 2016-01-15 DIAGNOSIS — C328 Malignant neoplasm of overlapping sites of larynx: Principal | ICD-10-CM | POA: Diagnosis present

## 2016-01-15 DIAGNOSIS — Z9889 Other specified postprocedural states: Secondary | ICD-10-CM

## 2016-01-15 HISTORY — DX: Unspecified osteoarthritis, unspecified site: M19.90

## 2016-01-15 HISTORY — PX: LARYNGETOMY: SHX5202

## 2016-01-15 HISTORY — PX: DIRECT LARYNGOSCOPY: SHX5326

## 2016-01-15 LAB — CBC
HCT: 41.9 % (ref 39.0–52.0)
HEMOGLOBIN: 13.8 g/dL (ref 13.0–17.0)
MCH: 31.2 pg (ref 26.0–34.0)
MCHC: 32.9 g/dL (ref 30.0–36.0)
MCV: 94.8 fL (ref 78.0–100.0)
Platelets: 184 10*3/uL (ref 150–400)
RBC: 4.42 MIL/uL (ref 4.22–5.81)
RDW: 12.5 % (ref 11.5–15.5)
WBC: 8.2 10*3/uL (ref 4.0–10.5)

## 2016-01-15 LAB — BASIC METABOLIC PANEL
Anion gap: 8 (ref 5–15)
BUN: 18 mg/dL (ref 6–20)
CHLORIDE: 110 mmol/L (ref 101–111)
CO2: 21 mmol/L — ABNORMAL LOW (ref 22–32)
CREATININE: 1.12 mg/dL (ref 0.61–1.24)
Calcium: 9.4 mg/dL (ref 8.9–10.3)
GFR calc non Af Amer: 57 mL/min — ABNORMAL LOW (ref >60)
Glucose, Bld: 116 mg/dL — ABNORMAL HIGH (ref 65–99)
POTASSIUM: 4.2 mmol/L (ref 3.5–5.1)
SODIUM: 139 mmol/L (ref 135–145)

## 2016-01-15 LAB — MRSA PCR SCREENING: MRSA by PCR: NEGATIVE

## 2016-01-15 SURGERY — LARYNGECTOMY
Anesthesia: General

## 2016-01-15 MED ORDER — FENTANYL CITRATE (PF) 250 MCG/5ML IJ SOLN
INTRAMUSCULAR | Status: AC
  Filled 2016-01-15: qty 5

## 2016-01-15 MED ORDER — 0.9 % SODIUM CHLORIDE (POUR BTL) OPTIME
TOPICAL | Status: DC | PRN
  Administered 2016-01-15: 1000 mL

## 2016-01-15 MED ORDER — ROCURONIUM BROMIDE 50 MG/5ML IV SOLN
INTRAVENOUS | Status: AC
  Filled 2016-01-15: qty 1

## 2016-01-15 MED ORDER — LIDOCAINE HCL 4 % EX SOLN
CUTANEOUS | Status: DC | PRN
  Administered 2016-01-15: 4 mL via TOPICAL

## 2016-01-15 MED ORDER — OXYCODONE HCL 5 MG/5ML PO SOLN: 5.0000 mg | Freq: Once | ORAL | Status: DC | PRN

## 2016-01-15 MED ORDER — EPINEPHRINE HCL (NASAL) 0.1 % NA SOLN
NASAL | Status: AC
  Filled 2016-01-15: qty 30

## 2016-01-15 MED ORDER — PROMETHAZINE HCL 25 MG RE SUPP: 25.0000 mg | Freq: Four times a day (QID) | RECTAL | Status: DC | PRN

## 2016-01-15 MED ORDER — PROPOFOL 10 MG/ML IV BOLUS
INTRAVENOUS | Status: DC | PRN
  Administered 2016-01-15: 90 mg via INTRAVENOUS

## 2016-01-15 MED ORDER — NEOSTIGMINE METHYLSULFATE 10 MG/10ML IV SOLN
INTRAVENOUS | Status: DC | PRN
  Administered 2016-01-15: 4 mg via INTRAVENOUS

## 2016-01-15 MED ORDER — SUCCINYLCHOLINE CHLORIDE 200 MG/10ML IV SOSY
PREFILLED_SYRINGE | INTRAVENOUS | Status: AC
  Filled 2016-01-15: qty 10

## 2016-01-15 MED ORDER — ONDANSETRON HCL 4 MG/2ML IJ SOLN
INTRAMUSCULAR | Status: DC | PRN
  Administered 2016-01-15: 4 mg via INTRAVENOUS

## 2016-01-15 MED ORDER — BACITRACIN ZINC 500 UNIT/GM EX OINT
TOPICAL_OINTMENT | CUTANEOUS | Status: DC | PRN
  Administered 2016-01-15: 1 via TOPICAL

## 2016-01-15 MED ORDER — GLYCOPYRROLATE 0.2 MG/ML IJ SOLN
INTRAMUSCULAR | Status: DC | PRN
  Administered 2016-01-15: .2 mg via INTRAVENOUS
  Administered 2016-01-15: 0.1 mg via INTRAVENOUS
  Administered 2016-01-15: 0.6 mg via INTRAVENOUS

## 2016-01-15 MED ORDER — MORPHINE SULFATE (PF) 2 MG/ML IV SOLN
2.0000 mg | INTRAVENOUS | Status: DC | PRN
  Administered 2016-01-17: 4 mg via INTRAVENOUS
  Filled 2016-01-15: qty 2

## 2016-01-15 MED ORDER — CLINDAMYCIN PHOSPHATE 900 MG/50ML IV SOLN
900.0000 mg | INTRAVENOUS | Status: AC
  Administered 2016-01-15: 900 mg via INTRAVENOUS
  Filled 2016-01-15: qty 50

## 2016-01-15 MED ORDER — LIDOCAINE-EPINEPHRINE 1 %-1:100000 IJ SOLN
INTRAMUSCULAR | Status: AC
  Filled 2016-01-15: qty 1

## 2016-01-15 MED ORDER — BACITRACIN ZINC 500 UNIT/GM EX OINT
TOPICAL_OINTMENT | CUTANEOUS | Status: AC
  Filled 2016-01-15: qty 28.35

## 2016-01-15 MED ORDER — PROMETHAZINE HCL 25 MG PO TABS
25.0000 mg | ORAL_TABLET | Freq: Four times a day (QID) | ORAL | Status: DC | PRN
Start: 2016-01-15 — End: 2016-01-22

## 2016-01-15 MED ORDER — PROPOFOL 10 MG/ML IV BOLUS
INTRAVENOUS | Status: AC
Start: 2016-01-15 — End: 2016-01-15
  Filled 2016-01-15: qty 20

## 2016-01-15 MED ORDER — DEXMEDETOMIDINE HCL 200 MCG/2ML IV SOLN
INTRAVENOUS | Status: DC | PRN
  Administered 2016-01-15 (×2): 4 ug via INTRAVENOUS
  Administered 2016-01-15: 8 ug via INTRAVENOUS

## 2016-01-15 MED ORDER — LACTATED RINGERS IV SOLN
INTRAVENOUS | Status: DC
  Administered 2016-01-15 – 2016-01-21 (×4): via INTRAVENOUS

## 2016-01-15 MED ORDER — LIDOCAINE HCL 2 % EX GEL
1.0000 "application " | Freq: Once | CUTANEOUS | Status: DC
  Filled 2016-01-15: qty 5

## 2016-01-15 MED ORDER — BACITRACIN ZINC 500 UNIT/GM EX OINT
1.0000 "application " | TOPICAL_OINTMENT | Freq: Three times a day (TID) | CUTANEOUS | Status: DC
  Administered 2016-01-15 – 2016-01-22 (×21): 1 via TOPICAL
  Filled 2016-01-15 (×3): qty 28.35

## 2016-01-15 MED ORDER — HYDROMORPHONE HCL 1 MG/ML IJ SOLN
INTRAMUSCULAR | Status: AC
  Administered 2016-01-15: 0.5 mg via INTRAVENOUS
  Filled 2016-01-15: qty 1

## 2016-01-15 MED ORDER — FENTANYL CITRATE (PF) 100 MCG/2ML IJ SOLN
INTRAMUSCULAR | Status: DC | PRN
  Administered 2016-01-15 (×4): 50 ug via INTRAVENOUS

## 2016-01-15 MED ORDER — ZOLPIDEM TARTRATE 5 MG PO TABS
2.5000 mg | ORAL_TABLET | Freq: Every evening | ORAL | Status: DC | PRN
  Administered 2016-01-15: 2.5 mg via ORAL
  Filled 2016-01-15: qty 1

## 2016-01-15 MED ORDER — MIDAZOLAM HCL 2 MG/2ML IJ SOLN
INTRAMUSCULAR | Status: AC
  Filled 2016-01-15: qty 2

## 2016-01-15 MED ORDER — ONDANSETRON HCL 4 MG/2ML IJ SOLN
INTRAMUSCULAR | Status: AC
  Filled 2016-01-15: qty 2

## 2016-01-15 MED ORDER — DEXTROSE-NACL 5-0.9 % IV SOLN
INTRAVENOUS | Status: DC
Start: 2016-01-15 — End: 2016-01-18
  Administered 2016-01-15: 75 mL via INTRAVENOUS
  Administered 2016-01-16 – 2016-01-18 (×4): via INTRAVENOUS

## 2016-01-15 MED ORDER — ROCURONIUM BROMIDE 100 MG/10ML IV SOLN
INTRAVENOUS | Status: DC | PRN
  Administered 2016-01-15 (×2): 25 mg via INTRAVENOUS
  Administered 2016-01-15: 20 mg via INTRAVENOUS

## 2016-01-15 MED ORDER — EPINEPHRINE HCL 1 MG/ML IJ SOLN
INTRAMUSCULAR | Status: AC
  Filled 2016-01-15: qty 1

## 2016-01-15 MED ORDER — MIDAZOLAM HCL 5 MG/5ML IJ SOLN
INTRAMUSCULAR | Status: DC | PRN
  Administered 2016-01-15 (×2): 1 mg via INTRAVENOUS

## 2016-01-15 MED ORDER — LIDOCAINE HCL (CARDIAC) 20 MG/ML IV SOLN
INTRAVENOUS | Status: DC | PRN
  Administered 2016-01-15: 25 mg via INTRAVENOUS

## 2016-01-15 MED ORDER — HYDROMORPHONE HCL 1 MG/ML IJ SOLN
0.2500 mg | INTRAMUSCULAR | Status: DC | PRN
  Administered 2016-01-15: 0.5 mg via INTRAVENOUS

## 2016-01-15 MED ORDER — DEXTROSE 5 % IV SOLN
10.0000 mg | INTRAVENOUS | Status: DC | PRN
  Administered 2016-01-15: 15 ug/min via INTRAVENOUS

## 2016-01-15 MED ORDER — ONDANSETRON HCL 4 MG/2ML IJ SOLN: 4.0000 mg | Freq: Once | INTRAMUSCULAR | Status: DC | PRN

## 2016-01-15 MED ORDER — OXYCODONE HCL 5 MG PO TABS: 5.0000 mg | ORAL_TABLET | Freq: Once | ORAL | Status: DC | PRN

## 2016-01-15 MED ORDER — CLINDAMYCIN PHOSPHATE 600 MG/50ML IV SOLN
600.0000 mg | Freq: Three times a day (TID) | INTRAVENOUS | Status: AC
  Administered 2016-01-15 – 2016-01-17 (×5): 600 mg via INTRAVENOUS
  Filled 2016-01-15 (×7): qty 50

## 2016-01-15 MED ORDER — TRAVOPROST (BAK FREE) 0.004 % OP SOLN
1.0000 [drp] | Freq: Every day | OPHTHALMIC | Status: DC
  Administered 2016-01-16 – 2016-01-21 (×6): 1 [drp] via OPHTHALMIC
  Filled 2016-01-15 (×2): qty 2.5

## 2016-01-15 SURGICAL SUPPLY — 56 items
CANISTER SUCTION 2500CC (MISCELLANEOUS) ×3 IMPLANT
CLEANER TIP ELECTROSURG 2X2 (MISCELLANEOUS) ×3 IMPLANT
CONT SPEC 4OZ CLIKSEAL STRL BL (MISCELLANEOUS) ×6 IMPLANT
CORDS BIPOLAR (ELECTRODE) ×3 IMPLANT
COVER SURGICAL LIGHT HANDLE (MISCELLANEOUS) ×6 IMPLANT
DECANTER SPIKE VIAL GLASS SM (MISCELLANEOUS) ×3 IMPLANT
DRAIN CHANNEL 15F RND FF W/TCR (WOUND CARE) ×3 IMPLANT
DRAPE PROXIMA HALF (DRAPES) ×3 IMPLANT
ELECT COATED BLADE 2.86 ST (ELECTRODE) ×3 IMPLANT
ELECT REM PT RETURN 9FT ADLT (ELECTROSURGICAL) ×3
ELECTRODE REM PT RTRN 9FT ADLT (ELECTROSURGICAL) ×1 IMPLANT
EVACUATOR SILICONE 100CC (DRAIN) ×3 IMPLANT
FORCEPS BIPOLAR SPETZLER 8 1.0 (NEUROSURGERY SUPPLIES) ×3 IMPLANT
GAUZE SPONGE 4X4 16PLY XRAY LF (GAUZE/BANDAGES/DRESSINGS) ×12 IMPLANT
GLOVE BIO SURGEON STRL SZ 6.5 (GLOVE) ×6 IMPLANT
GLOVE BIO SURGEONS STRL SZ 6.5 (GLOVE) ×3
GLOVE BIOGEL M 7.0 STRL (GLOVE) ×3 IMPLANT
GLOVE BIOGEL PI IND STRL 7.0 (GLOVE) ×1 IMPLANT
GLOVE BIOGEL PI IND STRL 8 (GLOVE) ×1 IMPLANT
GLOVE BIOGEL PI IND STRL 8.5 (GLOVE) ×1 IMPLANT
GLOVE BIOGEL PI INDICATOR 7.0 (GLOVE) ×2
GLOVE BIOGEL PI INDICATOR 8 (GLOVE) ×2
GLOVE BIOGEL PI INDICATOR 8.5 (GLOVE) ×2
GLOVE ECLIPSE 7.5 STRL STRAW (GLOVE) ×9 IMPLANT
GOWN STRL REUS W/ TWL LRG LVL3 (GOWN DISPOSABLE) ×1 IMPLANT
GOWN STRL REUS W/TWL LRG LVL3 (GOWN DISPOSABLE) ×2
GUARD TEETH (MISCELLANEOUS) ×3 IMPLANT
KIT BASIN OR (CUSTOM PROCEDURE TRAY) ×6 IMPLANT
KIT ROOM TURNOVER OR (KITS) ×3 IMPLANT
NEEDLE 27GAX1X1/2 (NEEDLE) ×3 IMPLANT
NS IRRIG 1000ML POUR BTL (IV SOLUTION) ×3 IMPLANT
PACK EENT II TURBAN DRAPE (CUSTOM PROCEDURE TRAY) ×3 IMPLANT
PAD ARMBOARD 7.5X6 YLW CONV (MISCELLANEOUS) ×6 IMPLANT
PATTIES SURGICAL .5 X3 (DISPOSABLE) ×3 IMPLANT
PENCIL FOOT CONTROL (ELECTRODE) ×3 IMPLANT
SOLUTION ANTI FOG 6CC (MISCELLANEOUS) ×3 IMPLANT
SPONGE LAP 18X18 X RAY DECT (DISPOSABLE) ×3 IMPLANT
STAPLER VISISTAT 35W (STAPLE) ×6 IMPLANT
SUT CHROMIC 3 0 SH 27 (SUTURE) ×9 IMPLANT
SUT CHROMIC 4 0 PS 2 18 (SUTURE) ×6 IMPLANT
SUT ETHILON 2 0 FS 18 (SUTURE) ×3 IMPLANT
SUT SILK 2 0 REEL (SUTURE) ×3 IMPLANT
SUT SILK 2 0 SH CR/8 (SUTURE) ×3 IMPLANT
SUT SILK 3 0 REEL (SUTURE) ×3 IMPLANT
SUT SILK 3 0 SH CR/8 (SUTURE) ×3 IMPLANT
SUT SILK 4 0 REEL (SUTURE) ×3 IMPLANT
SUT VIC AB 3-0 FS2 27 (SUTURE) ×3 IMPLANT
SUT VICRYL 4-0 PS2 18IN ABS (SUTURE) ×9 IMPLANT
SYR BULB IRRIGATION 50ML (SYRINGE) ×3 IMPLANT
SYR CONTROL 10ML LL (SYRINGE) ×3 IMPLANT
TOWEL OR 17X26 10 PK STRL BLUE (TOWEL DISPOSABLE) ×3 IMPLANT
TRAY ENT MC OR (CUSTOM PROCEDURE TRAY) ×3 IMPLANT
TRAY FOLEY W/METER SILVER 16FR (SET/KITS/TRAYS/PACK) IMPLANT
TUBE CONNECTING 12'X1/4 (SUCTIONS) ×1
TUBE CONNECTING 12X1/4 (SUCTIONS) ×2 IMPLANT
TUBE FEEDING 10FR FLEXIFLO (MISCELLANEOUS) ×3 IMPLANT

## 2016-01-15 NOTE — Progress Notes (Signed)
01/15/2016 8:49 PM  [place], Delfino Lovett [id number]  Post-Op Check    Temp:  [98.1 F (36.7 C)-98.5 F (36.9 C)] 98.1 F (36.7 C) (06/07 1935) Pulse Rate:  [60-95] 94 (06/07 2018) Resp:  [12-31] 18 (06/07 2018) BP: (117-154)/(58-109) 145/91 mmHg (06/07 2000) SpO2:  [94 %-100 %] 100 % (06/07 2000) Weight:  [88.451 kg (195 lb)] 88.451 kg (195 lb) (06/07 0927),     Intake/Output Summary (Last 24 hours) at 01/15/16 2049 Last data filed at 01/15/16 2000  Gross per 24 hour  Intake   1450 ml  Output    305 ml  Net   1145 ml   30 ml JP drainage.  Results for orders placed or performed during the hospital encounter of 01/15/16 (from the past 24 hour(s))  CBC     Status: None   Collection Time: 01/15/16  9:23 AM  Result Value Ref Range   WBC 8.2 4.0 - 10.5 K/uL   RBC 4.42 4.22 - 5.81 MIL/uL   Hemoglobin 13.8 13.0 - 17.0 g/dL   HCT 41.9 39.0 - 52.0 %   MCV 94.8 78.0 - 100.0 fL   MCH 31.2 26.0 - 34.0 pg   MCHC 32.9 30.0 - 36.0 g/dL   RDW 12.5 11.5 - 15.5 %   Platelets 184 150 - 400 K/uL  Basic metabolic panel     Status: Abnormal   Collection Time: 01/15/16  9:23 AM  Result Value Ref Range   Sodium 139 135 - 145 mmol/L   Potassium 4.2 3.5 - 5.1 mmol/L   Chloride 110 101 - 111 mmol/L   CO2 21 (L) 22 - 32 mmol/L   Glucose, Bld 116 (H) 65 - 99 mg/dL   BUN 18 6 - 20 mg/dL   Creatinine, Ser 1.12 0.61 - 1.24 mg/dL   Calcium 9.4 8.9 - 10.3 mg/dL   GFR calc non Af Amer 57 (L) >60 mL/min   GFR calc Af Amer >60 >60 mL/min   Anion gap 8 5 - 15    SUBJECTIVE:  Min pain.  Voiding small amounts.  Breathing OK.  Nurses note ectopy but with stable vital signs.  OBJECTIVE:  Alert/awake.  Neck flat.  Drain working well.  Writing intelligibly.  Stoma moist and very clean.   IMPRESSION:  Wound good.  Probable urinary obstruction/retention.  PLAN:   Bladder scan, probable foley catheter.  Stoma care.   Timothy Suarez

## 2016-01-15 NOTE — Anesthesia Preprocedure Evaluation (Addendum)
Anesthesia Evaluation  Patient identified by MRN, date of birth, ID band Patient awake    Reviewed: Allergy & Precautions, NPO status , Patient's Chart, lab work & pertinent test results  Airway Mallampati: I  TM Distance: <3 FB Neck ROM: Limited  Mouth opening: Limited Mouth Opening  Dental  (+) Poor Dentition   Pulmonary    Pulmonary exam normal breath sounds clear to auscultation       Cardiovascular hypertension, + CAD and + Peripheral Vascular Disease  Normal cardiovascular exam+ dysrhythmias + Valvular Problems/Murmurs  Rhythm:Regular Rate:Normal  Hx of mini mitral valve repair for mitral valve prolapse  Had afib, s/p MAZE procedure  Stress: low risk 01/2015 Echo: EF preserved, trace AI, mild MS (mean gradient is 4)   Neuro/Psych PSYCHIATRIC DISORDERS Depression CVA    GI/Hepatic   Endo/Other    Renal/GU      Musculoskeletal   Abdominal   Peds  Hematology   Anesthesia Other Findings Grade 1 view with Mac 4 DL and 6.5 ETT on 01/28/15  Updated H and P note from Dr. Constance Holster:  "Indirect laryngoscopy revealed severe and diffuse supraglottic edema. The cords are not easily visualized because of the supraglottic edema. Airway is somewhat restricted.  This is concerning for radiation induced chondritis or possibly recurrent tumor."  Reproductive/Obstetrics                          Anesthesia Physical  Anesthesia Plan  ASA: IV  Anesthesia Plan: General   Post-op Pain Management:    Induction: Intravenous  Airway Management Planned: Oral ETT  Additional Equipment:   Intra-op Plan:   Post-operative Plan: Possible Post-op intubation/ventilation  Informed Consent: I have reviewed the patients History and Physical, chart, labs and discussed the procedure including the risks, benefits and alternatives for the proposed anesthesia with the patient or authorized representative who has  indicated his/her understanding and acceptance.     Plan Discussed with: CRNA, Anesthesiologist and Surgeon  Anesthesia Plan Comments: (Possible difficult airway, possible tracheostomy)        Anesthesia Quick Evaluation

## 2016-01-15 NOTE — Anesthesia Postprocedure Evaluation (Signed)
Anesthesia Post Note  Patient: Timothy Suarez  Procedure(s) Performed: Procedure(s) (LRB):  TOTAL LARYNGECTOMY (N/A) DIRECT LARYNGOSCOPY WITH BIOPSY AND FROZEN SECTION (N/A)  Patient location during evaluation: PACU Anesthesia Type: General Level of consciousness: awake Pain management: pain level controlled Vital Signs Assessment: post-procedure vital signs reviewed and stable Respiratory status: patient connected to T-piece oxygen Cardiovascular status: stable Anesthetic complications: no    Last Vitals:  Filed Vitals:   01/15/16 1515 01/15/16 1530  BP: 132/76 144/63  Pulse: 70 71  Temp:    Resp: 16 15    Last Pain:  Filed Vitals:   01/15/16 1538  PainSc: 2                  EDWARDS,[name]

## 2016-01-15 NOTE — Progress Notes (Signed)
Notified 4W coude team of need for catheter.  Supplies ordered and at bedside.

## 2016-01-15 NOTE — Transfer of Care (Signed)
Immediate Anesthesia Transfer of Care Note  Patient: Timothy Suarez  Procedure(s) Performed: Procedure(s):  TOTAL LARYNGECTOMY (N/A) DIRECT LARYNGOSCOPY WITH BIOPSY AND FROZEN SECTION (N/A)  Patient Location: PACU  Anesthesia Type:General  Level of Consciousness: awake, alert , oriented and patient cooperative  Airway & Oxygen Therapy: Patient Spontanous Breathing and face mask over stoma  Post-op Assessment: Report given to RN and Post -op Vital signs reviewed and stable  Post vital signs: Reviewed and stable  Last Vitals:  Filed Vitals:   01/15/16 0940 01/15/16 1500  BP: 140/109 146/72  Pulse: 77   Temp: 36.9 C 36.8 C  Resp: 16 21    Last Pain: There were no vitals filed for this visit.    Patients Stated Pain Goal: 5 ([id number])  Complications: No apparent anesthesia complications

## 2016-01-15 NOTE — OR Nursing (Signed)
Dr. Constance Holster requested patient's family to be updated on patient's status. Information given to Timothy Suarez, AD.

## 2016-01-15 NOTE — Anesthesia Procedure Notes (Signed)
Procedure Name: Awake intubation Performed by: Jillyn Hidden Pre-anesthesia Checklist: Patient identified, Emergency Drugs available, Suction available and Patient being monitored Patient Re-evaluated:Patient Re-evaluated prior to inductionOxygen Delivery Method: Circle system utilized Preoxygenation: Pre-oxygenation with 100% oxygen Intubation Type: IV induction Tube type: Oral Tube size: 6.5 mm Number of attempts: 1 Airway Equipment and Method: Video-laryngoscopy Placement Confirmation: ETT inserted through vocal cords under direct vision,  positive ETCO2 and breath sounds checked- equal and bilateral Secured at: 24 cm Tube secured with: Tape Dental Injury: Teeth and Oropharynx as per pre-operative assessment  Difficulty Due To: Difficulty was anticipated and Difficult Airway-  due to edematous airway

## 2016-01-15 NOTE — Op Note (Signed)
OPERATIVE REPORT  DATE OF SURGERY: 01/15/2016  PATIENT:  Timothy Suarez,  82 y.o. male  PRE-OPERATIVE DIAGNOSIS:  HISTORY OF GLOTTIC CANCER  POST-OPERATIVE DIAGNOSIS:  HISTORY OF GLOTTIC CANCER  PROCEDURE:  Procedure(s):  TOTAL LARYNGECTOMY DIRECT LARYNGOSCOPY WITH BIOPSY AND FROZEN SECTION  SURGEON:  Beckie Salts, MD  ASSISTANTS: Jolene Provost, PA  ANESTHESIA:   General   EBL:  100 ml  DRAINS: 15 French round JP  LOCAL MEDICATIONS USED:  None  SPECIMEN:  1. Biopsy of right side larynx, frozen section analysis consistent with squamous cell carcinoma. 2. Total laryngectomy, for routine pathology.  COUNTS:  Correct  PROCEDURE DETAILS: The patient was taken to the operating room and placed on the operating table in the supine position. Following induction of general endotracheal anesthesia, the table was turned 90. Direct laryngoscopy was performed with a Jako laryngoscope. Suspension apparatus was used. A tooth protector was used. An erosive and somewhat exophytic lesion involving the right ventricle was identified and samples were sent for frozen section analysis. This was positive for squamous cell carcinoma.  The neck was then prepped and draped in a standard fashion. A transverse incision was outlined with a marking pen and a proposed tracheal stoma was outlined as well in the sternal notch allowing approximately 3 cm of skin between the 2 incisions. The main incision was opened using electrocautery and the platysma was divided. Subplatysmal flaps were elevated superiorly to the suprahyoid area and inferiorly to the clavicle. Self-retaining thyroid retractor was used throughout the case.   The lower strap muscles were transected above the clavicle and the thyroid gland was exposed. The isthmus was divided. The left lobe of the thyroid was reflected laterally and preserved with its blood supply. The right lobe was sacrificed. The upper trachea was exposed. The trachea was  opened between the first and second ring. The endotracheal tube was removed from the mouth and a 7.5 reinforced tube was then placed and secured in place with a silk suture. The upper larynx was then skeletonized. The suprahyoid musculature was divided. The greater horns of the hyoid were reflected anteriorly and skeletonized. The omohyoid muscles were divided. The superior thyroid neurovascular bundle was divided between clamps and ligated with silk suture. The trachea was completely divided and the party wall was dissected up towards the larynx.   The pharynx was entered on the left side and the mucosa was healthy around the piriform. The mucosal cuts were continued along the left side of the pharynx by the constrictor muscles and continuing down towards the lower tracheal stump. Under direct visualization the preepiglottic mucosa was divided and the right side was then divided with full visualization and the larynx was removed.   On the back table the larynx was opened posteriorly and inspected. There is a large erosive tumor involving what appeared to be the true cord the ventricle and false cord. Superior and inferior margins were generous, at least 3 cm. There did not appear to be any extralaryngeal spread or any subglottic involvement.   The pharyngo-esophagus was then closed using running 3-0 chromic suture in a T fashion. 3 sutures were used and a trifurcation stitch was then used to complete the closure. An Asepto syringe was used in the oral cavity to force saline through the esophagus and there is no evidence of leak. A secondary layer closure was accomplished using constrictor muscles and the thyroid remnant.   Prior to closing the pharyngoesophagus a Kangaroo nasogastric feeding tube was placed  on the left side and entered into the lower esophagus. This was secured to the nasal columella using silk suture. The stomal skin incision was then created and the stoma was matured using interrupted 3-0  Vicryl sutures. The neck wound was then irrigated with saline and suctioned. Hemostasis was completed and the drain was then exited through separate stab incision on the left cut to size and secured in place with silk suture. The platysma layer was reapproximated with chromic sutures interrupted, and staples were used on the skin. Bacitracin was applied. Patient was awakened, extubated and transferred to recovery in stable condition.    PATIENT DISPOSITION:  To PACU, stable

## 2016-01-15 NOTE — H&P (Signed)
  Here for followup. He is about a year post radiation for glottic cancer. He's having more trouble recently with his voice, and occasionally with his breathing. He has lost about 5 or 6 pounds. He doesn't have much appetite.  On exam, his voice is very hoarse and somewhat weakened. It is not breathy. He hasn't had any stridor but he does have some heavy breathing sound. Chest is clear to auscultation. Oral cavity and pharynx are dry but clear. Indirect laryngoscopy revealed severe and diffuse supraglottic edema. The cords are not easily visualized because of the supraglottic edema. Airway is somewhat restricted.  This is concerning for radiation induced chondritis or possibly recurrent tumor. Recommend CT evaluation and possible biopsy following that.  Documentation is copied from a text file reflecting my Dragon dictation for today's date of service.

## 2016-01-15 NOTE — OR Nursing (Signed)
Foley catheter insertion (16 Fr.) attempted by C. Cherlyn Cushing, RN. Unsuccessful. Dr. Constance Holster aware.

## 2016-01-16 ENCOUNTER — Encounter (HOSPITAL_COMMUNITY): Payer: Self-pay | Admitting: Otolaryngology

## 2016-01-16 ENCOUNTER — Other Ambulatory Visit: Payer: Medicare Other

## 2016-01-16 LAB — BASIC METABOLIC PANEL
ANION GAP: 6 (ref 5–15)
BUN: 12 mg/dL (ref 6–20)
CALCIUM: 8.5 mg/dL — AB (ref 8.9–10.3)
CO2: 26 mmol/L (ref 22–32)
Chloride: 106 mmol/L (ref 101–111)
Creatinine, Ser: 1.01 mg/dL (ref 0.61–1.24)
GFR calc non Af Amer: >60 mL/min (ref >60)
Glucose, Bld: 137 mg/dL — ABNORMAL HIGH (ref 65–99)
Potassium: 3.9 mmol/L (ref 3.5–5.1)
Sodium: 138 mmol/L (ref 135–145)

## 2016-01-16 LAB — CBC
HEMATOCRIT: 40.3 % (ref 39.0–52.0)
HEMOGLOBIN: 12.9 g/dL — AB (ref 13.0–17.0)
MCH: 30.7 pg (ref 26.0–34.0)
MCHC: 32 g/dL (ref 30.0–36.0)
MCV: 96 fL (ref 78.0–100.0)
Platelets: 160 10*3/uL (ref 150–400)
RBC: 4.2 MIL/uL — ABNORMAL LOW (ref 4.22–5.81)
RDW: 12.6 % (ref 11.5–15.5)
WBC: 13.2 10*3/uL — AB (ref 4.0–10.5)

## 2016-01-16 LAB — GLUCOSE, CAPILLARY
GLUCOSE-CAPILLARY: 131 mg/dL — AB (ref 65–99)
GLUCOSE-CAPILLARY: 131 mg/dL — AB (ref 65–99)
Glucose-Capillary: 114 mg/dL — ABNORMAL HIGH (ref 65–99)

## 2016-01-16 MED ORDER — AMLODIPINE BESYLATE 2.5 MG PO TABS
2.5000 mg | ORAL_TABLET | Freq: Every day | ORAL | Status: DC
Start: 2016-01-16 — End: 2016-01-22
  Administered 2016-01-16 – 2016-01-21 (×5): 2.5 mg
  Filled 2016-01-16 (×5): qty 1

## 2016-01-16 MED ORDER — ACETAMINOPHEN 160 MG/5ML PO SOLN: 650.0000 mg | ORAL | Status: DC | PRN

## 2016-01-16 MED ORDER — CHLORHEXIDINE GLUCONATE 0.12 % MT SOLN
15.0000 mL | Freq: Two times a day (BID) | OROMUCOSAL | Status: DC
  Administered 2016-01-16 – 2016-01-22 (×13): 15 mL via OROMUCOSAL
  Filled 2016-01-16 (×10): qty 15

## 2016-01-16 MED ORDER — ACETAMINOPHEN 650 MG RE SUPP: 325.0000 mg | RECTAL | Status: DC | PRN

## 2016-01-16 MED ORDER — ZOLPIDEM TARTRATE 5 MG PO TABS
2.5000 mg | ORAL_TABLET | Freq: Every evening | ORAL | Status: DC | PRN
  Administered 2016-01-16 – 2016-01-21 (×4): 2.5 mg
  Filled 2016-01-16 (×4): qty 1

## 2016-01-16 MED ORDER — DIPHENHYDRAMINE HCL 50 MG/ML IJ SOLN: 12.5000 mg | Freq: Three times a day (TID) | INTRAMUSCULAR | Status: DC | PRN

## 2016-01-16 MED ORDER — JEVITY 1.2 CAL PO LIQD
1000.0000 mL | ORAL | Status: DC
  Administered 2016-01-16 – 2016-01-17 (×2): 1000 mL
  Administered 2016-01-18: 70 mL/h
  Administered 2016-01-18: 1000 mL
  Filled 2016-01-16 (×14): qty 1000

## 2016-01-16 MED ORDER — CETYLPYRIDINIUM CHLORIDE 0.05 % MT LIQD
7.0000 mL | Freq: Two times a day (BID) | OROMUCOSAL | Status: DC
  Administered 2016-01-17 – 2016-01-22 (×8): 7 mL via OROMUCOSAL

## 2016-01-16 NOTE — Care Management Note (Signed)
Case Management Note  Patient Details  Name: Timothy Suarez MRN: [id number] Date of Birth: 1925/02/04  Subjective/Objective:      Pt lives independently in apt @ [address], has 2 very supportive daughters.  One daughter lives near him and states she will be able to take him home with her and will provide 24/7 assistance if needed.                     Expected Discharge Plan:  [place]  Discharge planning Services  CM Consult  Status of Service:  In process, will continue to follow  Girard Cooter, RN 01/16/2016, 2:28 PM

## 2016-01-16 NOTE — Progress Notes (Addendum)
Initial Nutrition Assessment  DOCUMENTATION CODES:   Not applicable  INTERVENTION:   Initiate Jevity 1.2 formula at 20 ml/hr and increase by 10 ml every 4 hours to goal rate of 70 ml/hr to provide 2016 kcals, 93 protein, 1356 ml of free water daily.  NUTRITION DIAGNOSIS:   Inadequate oral intake related to inability to eat as evidenced by NPO status  GOAL:   Patient will meet greater than or equal to 90% of their needs  MONITOR:   TF tolerance, Diet advancement, Labs, Weight trends, I & O's  REASON FOR ASSESSMENT:   Consult Enteral/tube feeding initiation and management  ASSESSMENT:   82 yo Male with PMH of mitral valve prolapse, A-fib, CAD, HTN; about a year post radiation for glottic cancer.  Patient s/p procedures 6/7: TOTAL LARYNGECTOMY DIRECT LARYNGOSCOPY WITH BIOPSY AND FROZEN SECTION  RD unable to obtain nutrition hx. Transferring from 2S-SICU to 6N-Surgical. Has 10 Fr small bore feeding tube in place. Consulted for TF initiation & management.   Nutrition focused physical exam completed.  No muscle or subcutaneous fat depletion noticed.  Diet Order:  Diet NPO time specified  Skin:  Reviewed, no issues  Last BM:  6/6  Height:   Ht Readings from Last 1 Encounters:  01/15/16 6' (1.829 m)    Weight:   Wt Readings from Last 1 Encounters:  01/15/16 195 lb (88.451 kg)    Ideal Body Weight:  81 kg  BMI:  Body mass index is 26.44 kg/(m^2).  Estimated Nutritional Needs:   Kcal:  1900-2100  Protein:  90-100 gm  Fluid:  1.9-2.1 L  EDUCATION NEEDS:   No education needs identified at this time  Arthur Holms, RD, LDN Pager #: [phone] After-Hours Pager #: [phone]

## 2016-01-16 NOTE — Evaluation (Signed)
Speech Language Pathology Evaluation Patient Details Name: Timothy Suarez MRN: [id number] DOB: 1925/05/22 Today's Date: 01/16/2016 Time: [id number] SLP Time Calculation (min) (ACUTE ONLY): 43 min  Problem List:  Patient Active Problem List   Diagnosis Date Noted  . S/P laryngectomy 01/15/2016  . S/P biopsy 01/15/2016  . Vascular dementia with depressed mood 12/09/2015  . Baker's cyst of knee 12/09/2015  . Other fatigue 12/09/2015  . 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 Prostate cancer ([place]) 01/10/2015  . S/P MVR (mitral valve repair) 12/20/2014  . S/P Maze operation for atrial fibrillation 12/20/2014  . Obesity (BMI 30-39.9) 04/02/2014  . Prolonged Q-T interval on ECG 04/02/2014  . Mild cognitive impairment 04/02/2014  . Major depressive disorder, recurrent episode, mild ([place]) 02/08/2014  . Insomnia 02/08/2014  . Dizziness and giddiness 02/08/2014  . Generalized muscle weakness 02/08/2014  . Cerumen impaction 02/08/2014  . Glaucoma 02/08/2014  . Colon cancer ([place])   . Coronary artery disease   . Moderate aortic insufficiency 01/03/2013  . Mild mitral regurgitation 01/03/2013  . MVP (mitral valve prolapse)   . Paroxysmal atrial fibrillation (H[facility])   . HTN (hypertension)   . Depression   . chest wall hernia (lung hernia)    Past Medical History:  Past Medical History  Diagnosis Date  . MVP (mitral valve prolapse)     S/P Rt mini thoractomy for Mitral Valve repair  . Paroxysmal atrial fibrillation (HCC)     S/P Maze procedure  . Depression   . Hernia   . Aortic valve insufficiency, acquired   . Dyslipidemia   . Glaucoma   . BPH (benign prostatic hyperplasia)   . Heart valve replaced   . A-fib ([place])   . Cerebral ischemia   . Sleep disturbance   . Mild cognitive impairment   . Thoracic aortic aneurysm ([place])   . Major depression ([place])   . Hyperlipidemia   . History of echocardiogram     Echo 5/16:  Mild LVH, EF 50-55%, Gr 1  DD, septal HK, Ao sclerosis without stenosis, mild AI, MV repair ok with borderline mild MS (mean 4 mmHg), mild MR, mild LAE, mod RAE, mild TR, PASP 30 mmHg  . Hx of cardiovascular stress test     Lexiscan Myoview 5/16:  Apical thinning, EF not gated, no ischemia. Low Risk  . Coronary artery disease     50-70% LAD mitral valve repair 4+ yrs ago  . Shortness of breath dyspnea     with exertion  . HTN (hypertension)     takes Amlodipine daily  . Arthritis   . Colon cancer [facility])     colon s/p partial colectomy  . Prostate cancer ([place])   . Cancer ([place])     invasive squamous cell carcinoma of right vocal cord  . Urinary urgency   . Urinary frequency   . Nocturia    Past Surgical History:  Past Surgical History  Procedure Laterality Date  . Flexhd patch repair of chest wall hernia.  01/28/11    [name]  . Colectomy  2004    Dr [name]  . Tympanoplasty    . Mitral valve repair  12/27/2008    complex valvuloplasty with 86mm Memo 3D annuloplasty ring via right minithoracotomy  . Maze  12/27/2008    left side lesion set  . Mitral valve repair      minimally invasive  . Tee without cardioversion N/A 01/03/2013    Procedure: TRANSESOPHAGEAL  ECHOCARDIOGRAM (TEE);  Surgeon: Sueanne Margarita, MD;  Location: [facility];  Service: Cardiovascular;  Laterality: N/A;  . Total knee arthroplasty Right 2003  . Microlaryngoscopy Right 01/29/2015    Procedure: MICROLARYNGOSCOPY WITH BIOSPY OF RIGHT VOCAL CORD;  Surgeon: Izora Gala, MD;  Location: [facility];  Service: ENT;  Laterality: Right;  . Prostate biopsy    . Laryngetomy N/A 01/15/2016    Procedure:  TOTAL LARYNGECTOMY;  Surgeon: Izora Gala, MD;  Location: [facility];  Service: ENT;  Laterality: N/A;  . Direct laryngoscopy N/A 01/15/2016    Procedure: DIRECT LARYNGOSCOPY WITH BIOPSY AND FROZEN SECTION;  Surgeon: Izora Gala, MD;  Location: [facility];  Service: ENT;  Laterality: N/A;   HPI:  82 yo Male with PMH of mitral valve prolapse, A-fib, CAD, HTN; about a  year post radiation for glottic cancer. He is s/p total laryngectomy on 6/7. No prior SLP notes found in chart, and pt denies getting any SLP services in the past.   Assessment / Plan / Recommendation Clinical Impression  Pt is aphonic s/p total laryngectomy, requiring SLP f/u to facilitate communication. SLP provided education about anatomical changes s/p procedure with picture aids.  Electrolarynx with intraoral adaptor introduced today with SLP demonstration. Written handout provided. Pt required Max cues for practice at the word level. Currently, he appears to prefer writing and does so with minimal need for revisions. Will continue efforts with electrolarynx while in house. Case manager contacted about obtaining one for after d/c. He will also need OP SLP f/u to maximize functional communication.    SLP Assessment  Patient needs continued Speech Lanaguage Pathology Services    Follow Up Recommendations  Outpatient SLP    Frequency and Duration min 2x/week  2 weeks      SLP Evaluation Prior Functioning      Cognition  Orientation Level: Intubated/Tracheostomy - Unable to assess (stoma)    Comprehension       Expression Expression Primary Mode of Expression: Nonverbal - written Verbal Expression Overall Verbal Expression: Impaired Non-Verbal Means of Communication: Writing;Gestures   Oral / Motor  Motor Speech Overall Motor Speech: Impaired Phonation: Aphonic (secondary to total laryngectomy)   GO                   Germain Osgood, M.A. CCC-SLP [phone]  Germain Osgood 01/16/2016, 5:29 PM

## 2016-01-16 NOTE — Progress Notes (Addendum)
Report called to bedside Nurse on 6N regarding the transfer of the patient.  Timothy Suarez

## 2016-01-16 NOTE — Progress Notes (Signed)
Pt admitted to the unit. Emergency equipment by bedside. Called RT to assess patient with RN. Pt is stable, alert and oriented per baseline. Oriented to room, staff, and call bell. Educated to call for any assistance. Bed in lowest position, call bell within reach- will continue to monitor.

## 2016-01-16 NOTE — Progress Notes (Addendum)
Patient transferred to [facility] with chart via bed, hearing aids, and other belongings, nurse at the bedside, VSS, pt transferred with venti mask on 8L, family notified, receiving RN and NT at bedside.   Inis Sizer

## 2016-01-16 NOTE — Progress Notes (Signed)
Patient ID: Timothy Suarez, male   DOB: 1924/11/30, 82 y.o.   MRN: [id number] Subjective: Awake and alert, able to write and answer questions. He seems very comfortable, and in minimal pain. Breathing easy.  Objective: Vital signs in last 24 hours: Temp:  [97.6 F (36.4 C)-98.5 F (36.9 C)] 97.6 F (36.4 C) (06/08 0806) Pulse Rate:  [60-95] 76 (06/08 0800) Resp:  [12-31] 21 (06/08 0800) BP: (117-154)/(58-109) 148/72 mmHg (06/08 0800) SpO2:  [94 %-100 %] 99 % (06/08 0800) Weight:  [88.451 kg (195 lb)] 88.451 kg (195 lb) (06/07 0927) Weight change:  Last BM Date: 01/14/16  Intake/Output from previous day: 06/07 0701 - 06/08 0700 In: 2595 [I.V.:2375; NG/GT:120; IV Piggyback:100] Out: 1740 [Urine:1530; Drains:60; Blood:150] Intake/Output this shift: Total I/O In: 75 [I.V.:75] Out: 150 [Urine:150]  PHYSICAL EXAM: Awake and alert.  Neck without swelling. JP functioning, stripped of bloody material.  Stoma is patent and clear.  Chest is clear bilaterally.  Heart regular.  Lab Results:  Recent Labs  01/15/16 0923 01/16/16 0300  WBC 8.2 13.2*  HGB 13.8 12.9*  HCT 41.9 40.3  PLT 184 160   BMET  Recent Labs  01/15/16 0923 01/16/16 0300  NA 139 138  K 4.2 3.9  CL 110 106  CO2 21* 26  GLUCOSE 116* 137*  BUN 18 12  CREATININE 1.12 1.01  CALCIUM 9.4 8.5*    Studies/Results: Dg Chest Port 1 View  01/15/2016  CLINICAL DATA:  Status post total laryngectomy EXAM: PORTABLE CHEST 1 VIEW COMPARISON:  Chest radiograph February 23, 2011; chest CT Dec 25, 2011 FINDINGS: Tracheostomy in place. Nasogastric tube tip in stomach. No pneumothorax. There is airspace consolidation in the left lower lobe with small left effusion. There is mild right base atelectasis. There is cardiomegaly with pulmonary vascularity within normal limits. Postoperative changes noted with overlying the right hilar region that. There is a mitral valve replacement. IMPRESSION: No pneumothorax. Cardiomegaly. Left  lower lobe consolidation with left effusion. Mild right base atelectasis. Electronically Signed   By: Lowella Grip III M.D.   On: 01/15/2016 16:19    Medications: I have reviewed the patient's current medications.  Assessment/Plan: POD 1 total laryngectomy. Appreciate whoever placed the Foley. WBC up and atalectasis on CXR. Will start ambulating and deep breathing. Will have PT assist. Transfer to floor. Start tube feeds. Speech Path.     LOS: 1 day   Kalyani Maeda 01/16/2016, 8:34 AM

## 2016-01-17 LAB — GLUCOSE, CAPILLARY
GLUCOSE-CAPILLARY: 133 mg/dL — AB (ref 65–99)
GLUCOSE-CAPILLARY: 137 mg/dL — AB (ref 65–99)
GLUCOSE-CAPILLARY: 145 mg/dL — AB (ref 65–99)
Glucose-Capillary: 131 mg/dL — ABNORMAL HIGH (ref 65–99)
Glucose-Capillary: 141 mg/dL — ABNORMAL HIGH (ref 65–99)

## 2016-01-17 LAB — CBC WITH DIFFERENTIAL/PLATELET
BASOS ABS: 0 10*3/uL (ref 0.0–0.1)
Basophils Relative: 0 %
EOS ABS: 0 10*3/uL (ref 0.0–0.7)
EOS PCT: 0 %
HCT: 37.6 % — ABNORMAL LOW (ref 39.0–52.0)
Hemoglobin: 12 g/dL — ABNORMAL LOW (ref 13.0–17.0)
Lymphocytes Relative: 9 %
Lymphs Abs: 0.9 10*3/uL (ref 0.7–4.0)
MCH: 30.6 pg (ref 26.0–34.0)
MCHC: 31.9 g/dL (ref 30.0–36.0)
MCV: 95.9 fL (ref 78.0–100.0)
MONO ABS: 1 10*3/uL (ref 0.1–1.0)
Monocytes Relative: 9 %
Neutro Abs: 8.5 10*3/uL — ABNORMAL HIGH (ref 1.7–7.7)
Neutrophils Relative %: 82 %
PLATELETS: 165 10*3/uL (ref 150–400)
RBC: 3.92 MIL/uL — AB (ref 4.22–5.81)
RDW: 12.7 % (ref 11.5–15.5)
WBC: 10.4 10*3/uL (ref 4.0–10.5)

## 2016-01-17 NOTE — Evaluation (Signed)
Physical Therapy Evaluation Patient Details Name: Timothy Suarez MRN: [id number] DOB: 15-Jan-1925 Today's Date: 01/17/2016   History of Present Illness  Admited6/7/17 for complete laryngectomy related to glottic cancer. Has a Baker's cyst L knee that is painful  Clinical Impression  The patient is very pleasant and motivated. Assisted to EOB and stood but unable to take a step with RW due to L knee pian. The patient communicates via writing. 2 daughters are present. Discussed options for post acute  Disposition. They seem overwhelmed at present.  Local daughter can stay w/ patient or he can stay with her. Currently the amount of medical devices that need to be monitored and maintained  as well as patient's current deconditioned stay would require maximum caregiver efforts. Recommend they consider SNF rehab. Will need HHPT if DC to home is desired. Recommend OT consult in acute care. Pt admitted with above diagnosis. Pt currently with functional limitations due to the deficits listed below (see PT Problem List). Pt will benefit from skilled PT to increase their independence and safety with mobility to allow discharge to the venue listed below.      Follow Up Recommendations SNF;Supervision/Assistance - 24 hour (family currently considering Home)    Equipment Recommendations  Wheelchair (measurements PT);Wheelchair cushion (measurements PT);Hospital bed    Recommendations for Other Services OT consult     Precautions / Restrictions Precautions Precaution Comments: on TC, tube feeding, large stoma ,drain in neck, has Baker's cyst left knee      Mobility  Bed Mobility Overal bed mobility: Needs Assistance Bed Mobility: Supine to Sit;Sit to Supine     Supine to sit: Mod assist Sit to supine: Mod assist   General bed mobility comments: assist with legs and trunk   Transfers Overall transfer level: Needs assistance Equipment used: Rolling walker (2 wheeled) Transfers: Sit to/from  Stand Sit to Stand: Mod assist;+2 safety/equipment         General transfer comment: assist to power up from bed raised. Improved on 3rd trail. im=ncreased weight on the Left leg. stood x 3 minutes, 1.5 minutes x 2. Sats on 25% TC >90%, HR 90-105  Ambulation/Gait Ambulation/Gait assistance: +2 safety/equipment           General Gait Details: attempted to take a  side step but L knee painful.  Stairs            Wheelchair Mobility    Modified Rankin (Stroke Patients Only)       Balance Overall balance assessment: Needs assistance Sitting-balance support: Feet supported;Bilateral upper extremity supported Sitting balance-Leahy Scale: Fair     Standing balance support: During functional activity;Bilateral upper extremity supported Standing balance-Leahy Scale: Poor                               Pertinent Vitals/Pain Pain Assessment: Faces Faces Pain Scale: Hurts little more Pain Location: Popliteal space left knee Pain Descriptors / Indicators: Discomfort;Grimacing Pain Intervention(s): Limited activity within patient's tolerance;Repositioned    Home Living Family/patient expects to be discharged to:: Other (Comment) (from [facility]) Living Arrangements: Children Available Help at Discharge: Family;Available 24 hours/day Type of Home: Apartment       Home Layout: One level Home Equipment: Walker - 2 wheels;Cane - single point Additional Comments: from [place] living    Prior Function Level of Independence: Independent with assistive device(s)  Hand Dominance        Extremity/Trunk Assessment   Upper Extremity Assessment: Generalized weakness           Lower Extremity Assessment: Generalized weakness;LLE deficits/detail   LLE Deficits / Details: decreased knee extension     Communication   Communication: Other (comment) (communicates with writing)  Cognition Arousal/Alertness:  Awake/alert   Overall Cognitive Status: Difficult to assess (appears to be aware of sirtuation)                      General Comments      Exercises        Assessment/Plan    PT Assessment Patient needs continued PT services  PT Diagnosis Difficulty walking;Generalized weakness;Acute pain   PT Problem List Decreased strength;Decreased range of motion;Decreased activity tolerance;Decreased balance;Decreased mobility;Decreased knowledge of precautions;Decreased safety awareness;Decreased knowledge of use of DME;Pain;Cardiopulmonary status limiting activity  PT Treatment Interventions DME instruction;Gait training;Functional mobility training;Therapeutic activities;Therapeutic exercise;Patient/family education   PT Goals (Current goals can be found in the Care Plan section) Acute Rehab PT Goals Patient Stated Goal: to go  home PT Goal Formulation: With patient/family Time For Goal Achievement: 01/31/16 Potential to Achieve Goals: Fair    Frequency Min 3X/week   Barriers to discharge Decreased caregiver support      Co-evaluation               End of Session Equipment Utilized During Treatment: Gait belt Activity Tolerance: Patient tolerated treatment well Patient left: in bed;with call bell/phone within reach;with bed alarm set;with family/visitor present Nurse Communication: Mobility status         Time: [id number] PT Time Calculation (min) (ACUTE ONLY): 51 min   Charges:   PT Evaluation $PT Eval Moderate Complexity: 1 Procedure PT Treatments $Therapeutic Activity: 23-37 mins   PT G Codes:        Claretha Cooper 01/17/2016, 4:40 PM  Tresa Endo PT [phone]

## 2016-01-17 NOTE — Progress Notes (Signed)
Speech Language Pathology Treatment: Cognitive-Linquistic  Patient Details Name: Timothy Suarez MRN: [id number] DOB: 28-Nov-1924 Today's Date: 01/17/2016 Time: [id number] SLP Time Calculation (min) (ACUTE ONLY): 33 min  Assessment / Plan / Recommendation Clinical Impression  Pt seen for additional education and practice with electrolarynx. He shows improved intelligibility of speech at the word level, although still requiring Max faded to Mod cues. Cueing primarily is needed for coordination of vibration with word production and over articulation. Two daughters present and educated as well. SLP portion of paperwork to apply for electrolarynx was left with daughter, Lattie Haw, who said she would be submitting it. Will continue to follow. Pt will need OP SLP upon discharge.   HPI HPI: 82 yo Male with PMH of mitral valve prolapse, A-fib, CAD, HTN; about a year post radiation for glottic cancer. He is s/p total laryngectomy on 6/7. No prior SLP notes found in chart, and pt denies getting any SLP services in the past.      SLP Plan  Continue with current plan of care     Recommendations                Follow up Recommendations: Outpatient SLP Plan: Continue with current plan of care     GO               Germain Osgood, M.A. CCC-SLP [phone]  Germain Osgood 01/17/2016, 4:30 PM

## 2016-01-17 NOTE — Progress Notes (Signed)
Patient ID: Timothy Suarez, male   DOB: 04/20/1925, 82 y.o.   MRN: [id number] Subjective: Continues to do well, having a hard time ambulating because of the left knee.  Objective: Vital signs in last 24 hours: Temp:  [98.2 F (36.8 C)-99.7 F (37.6 C)] 99.7 F (37.6 C) (06/09 0519) Pulse Rate:  [74-92] 81 (06/09 0839) Resp:  [16-25] 16 (06/09 0820) BP: (139-171)/(48-85) 161/67 mmHg (06/09 0839) SpO2:  [95 %-100 %] 97 % (06/09 0820) FiO2 (%):  [28 %] 28 % (06/09 0820) Weight:  [96.2 kg (212 lb 1.3 oz)] 96.2 kg (212 lb 1.3 oz) (06/08 2158) Weight change: 7.749 kg (17 lb 1.3 oz) Last BM Date: 01/14/16  Intake/Output from previous day: 06/08 0701 - 06/09 0700 In: 420 [I.V.:300; NG/GT:120] Out: 1910 [Urine:1875; Drains:35] Intake/Output this shift: Total I/O In: -  Out: 5 [Drains:5]  PHYSICAL EXAM: Awake and alert. Breathing well. Stoma suctioned of thick mucous but no crusting. Neck looks escellent. JP functioning.   Lab Results:  Recent Labs  01/16/16 0300 01/17/16 0217  WBC 13.2* 10.4  HGB 12.9* 12.0*  HCT 40.3 37.6*  PLT 160 165   BMET  Recent Labs  01/15/16 0923 01/16/16 0300  NA 139 138  K 4.2 3.9  CL 110 106  CO2 21* 26  GLUCOSE 116* 137*  BUN 18 12  CREATININE 1.12 1.01  CALCIUM 9.4 8.5*    Studies/Results: Dg Chest Port 1 View  01/15/2016  CLINICAL DATA:  Status post total laryngectomy EXAM: PORTABLE CHEST 1 VIEW COMPARISON:  Chest radiograph February 23, 2011; chest CT Dec 25, 2011 FINDINGS: Tracheostomy in place. Nasogastric tube tip in stomach. No pneumothorax. There is airspace consolidation in the left lower lobe with small left effusion. There is mild right base atelectasis. There is cardiomegaly with pulmonary vascularity within normal limits. Postoperative changes noted with overlying the right hilar region that. There is a mitral valve replacement. IMPRESSION: No pneumothorax. Cardiomegaly. Left lower lobe consolidation with left effusion. Mild  right base atelectasis. Electronically Signed   By: Lowella Grip III M.D.   On: 01/15/2016 16:19    Medications: I have reviewed the patient's current medications.  Assessment/Plan: POD 2 doing well. I will discuss with Orthopedics to see if we can have the knee cyst drained while in house to facilitate ambulation. WBC better. Tube feeds going. SLP assisting with speech.   LOS: 2 days   Nasir Bright 01/17/2016, 10:14 AM

## 2016-01-17 NOTE — Care Management Note (Addendum)
Case Management Note  Patient Details  Name: Timothy Suarez MRN: [id number] Date of Birth: 1924-08-17  Subjective/Objective:                    Action/Plan:  Spoke to patient and daughter at bedside . Explained electrolarynx application , left same with daughter . Left speech therapy 's portion in shadow chart for [place] . Patient's daughter aware it must be mailed with patient's portion . Daughter has address.   Dr Constance Holster also in room . Patient may be able to discharge to home without tube feedings  , if so no home health RN needed.  Daughter asking what care he will need at home in regards to wound care. Explained Dr Constance Holster will leave instructions and bedside nurse will begin teaching . Explained patient will home home suction and portable suction machine brought to room before discharge. PT to see patient today also and will make recommendations . Daughter voiced understanding.  Will continue to follow.  Expected Discharge Date:                  Expected Discharge Plan:  [place]  In-House Referral:     Discharge planning Services  CM Consult  Post Acute Care Choice:    Choice offered to:     DME Arranged:    DME Agency:     HH Arranged:    [place] Agency:     Status of Service:  In process, will continue to follow  Medicare Important Message Given:    Date Medicare IM Given:    Medicare IM give by:    Date Additional Medicare IM Given:    Additional Medicare Important Message give by:     If discussed at [place] of Stay Meetings, dates discussed:    Additional Comments:  [name], RN 01/17/2016, 10:16 AM

## 2016-01-17 NOTE — Care Management Important Message (Signed)
Important Message  Patient Details  Name: Timothy Suarez MRN: [id number] Date of Birth: 09-Jul-1925   Medicare Important Message Given:  Yes    Jaiven Graveline Abena 01/17/2016, 11:25 AM

## 2016-01-17 NOTE — Progress Notes (Signed)
PT Cancellation Note  Patient Details Name: Timothy Suarez MRN: [id number] DOB: 1925-01-11   Cancelled Treatment:    Reason Eval/Treat Not Completed: Fatigue/lethargy limiting ability to participate (sleeping after medication. will check back later today.)   Claretha Cooper 01/17/2016, 12:56 PM Tresa Endo PT [phone]

## 2016-01-18 LAB — GLUCOSE, CAPILLARY
GLUCOSE-CAPILLARY: 140 mg/dL — AB (ref 65–99)
GLUCOSE-CAPILLARY: 122 mg/dL — AB (ref 65–99)
Glucose-Capillary: 131 mg/dL — ABNORMAL HIGH (ref 65–99)
Glucose-Capillary: 146 mg/dL — ABNORMAL HIGH (ref 65–99)
Glucose-Capillary: 151 mg/dL — ABNORMAL HIGH (ref 65–99)
Glucose-Capillary: 158 mg/dL — ABNORMAL HIGH (ref 65–99)

## 2016-01-18 NOTE — Progress Notes (Signed)
Patient ID: Timothy Suarez, male   DOB: [birth year], 82 y.o.   MRN: [id number] Subjective: POD 3 no complaints.  Objective: Vital signs in last 24 hours: Temp:  [98.8 F (37.1 C)-100.4 F (38 C)] 99.3 F (37.4 C) (06/10 0559) Pulse Rate:  [78-91] 80 (06/10 1208) Resp:  [16-20] 18 (06/10 1208) BP: (152-263)/(67-77) 161/68 mmHg (06/10 0559) SpO2:  [96 %-100 %] 98 % (06/10 1208) FiO2 (%):  [28 %] 28 % (06/10 1208) Weight:  [97.841 kg (215 lb 11.2 oz)-98.113 kg (216 lb 4.8 oz)] 98.113 kg (216 lb 4.8 oz) (06/10 0559) Weight change: 1.641 kg (3 lb 9.9 oz) Last BM Date: 01/15/16  Intake/Output from previous day: 06/09 0701 - 06/10 0700 In: 3350.4 [I.V.:1618.8; NG/GT:1731.7] Out: 1230 [Urine:1200; Drains:30] Intake/Output this shift:    PHYSICAL EXAM: Awake and alert, sitting in chair. Stoma cleaned of secretions, no crusting. Incision excellent. No swelling.  Lab Results:  Recent Labs  01/16/16 0300 01/17/16 0217  WBC 13.2* 10.4  HGB 12.9* 12.0*  HCT 40.3 37.6*  PLT 160 165   BMET  Recent Labs  01/16/16 0300  NA 138  K 3.9  CL 106  CO2 26  GLUCOSE 137*  BUN 12  CREATININE 1.01  CALCIUM 8.5*    Studies/Results: No results found.  Medications: I have reviewed the patient's current medications.  Assessment/Plan: POD 3 doing nicely. Continue tube feeds.   LOS: 3 days   Clifford Coudriet 01/18/2016, 12:45 PM

## 2016-01-18 NOTE — Clinical Social Work Note (Signed)
CSW received referral for SNF.  Case discussed with case manager, and plan is to discharge home with home health.  CSW to sign off please re-consult if social work needs arise.  Izamar Linden R. Navreet Bolda, MSW, LCSWA [phone]  

## 2016-01-19 LAB — GLUCOSE, CAPILLARY
GLUCOSE-CAPILLARY: 131 mg/dL — AB (ref 65–99)
GLUCOSE-CAPILLARY: 112 mg/dL — AB (ref 65–99)
GLUCOSE-CAPILLARY: 112 mg/dL — AB (ref 65–99)
Glucose-Capillary: 132 mg/dL — ABNORMAL HIGH (ref 65–99)
Glucose-Capillary: 131 mg/dL — ABNORMAL HIGH (ref 65–99)
Glucose-Capillary: 122 mg/dL — ABNORMAL HIGH (ref 65–99)

## 2016-01-19 NOTE — Progress Notes (Signed)
Patient ID: Timothy Suarez, male   DOB: 30-Mar-1925, 82 y.o.   MRN: [id number] Subjective: No complaints.   Objective: Vital signs in last 24 hours: Temp:  [98.8 F (37.1 C)-100.7 F (38.2 C)] 99.3 F (37.4 C) (06/11 0436) Pulse Rate:  [76-86] 76 (06/11 0817) Resp:  [16-18] 18 (06/11 0817) BP: (131-144)/(58-70) 144/70 mmHg (06/11 0436) SpO2:  [95 %-98 %] 98 % (06/11 0817) FiO2 (%):  [28 %] 28 % (06/11 0817) Weight:  [97.705 kg (215 lb 6.4 oz)] 97.705 kg (215 lb 6.4 oz) (06/11 0436) Weight change: -0.136 kg (-4.8 oz) Last BM Date: 01/16/16  Intake/Output from previous day: 06/10 0701 - 06/11 0700 In: 2330.8 [I.V.:662.5; NG/GT:1668.3] Out: 1415 [Urine:1400; Drains:15] Intake/Output this shift:    PHYSICAL EXAM: Awake and alert, sitting in chair. Stoma clean inside, sticky mucous secretions externally cleaned off easily.   Lab Results:  Recent Labs  01/17/16 0217  WBC 10.4  HGB 12.0*  HCT 37.6*  PLT 165   BMET No results for input(s): NA, K, CL, CO2, GLUCOSE, BUN, CREATININE, CALCIUM in the last 72 hours.  Studies/Results: No results found.  Medications: I have reviewed the patient's current medications.  Assessment/Plan: Progressing nicely. JP out tomorrow. Start po liquids Tuesday.    LOS: 4 days   Bronsyn Shappell 01/19/2016, 11:04 AM

## 2016-01-19 NOTE — Progress Notes (Signed)
Notified by CCMD that patient experienced 5 beats of V-tach around 5am. Pt. Found sleeping comfortably with a resting radial HR of 74 in no distress. Will continue to monitor. Jimmie Molly, RN

## 2016-01-20 ENCOUNTER — Inpatient Hospital Stay (HOSPITAL_COMMUNITY): Payer: Medicare Other

## 2016-01-20 LAB — GLUCOSE, CAPILLARY
GLUCOSE-CAPILLARY: 102 mg/dL — AB (ref 65–99)
GLUCOSE-CAPILLARY: 113 mg/dL — AB (ref 65–99)
GLUCOSE-CAPILLARY: 116 mg/dL — AB (ref 65–99)
GLUCOSE-CAPILLARY: 126 mg/dL — AB (ref 65–99)
GLUCOSE-CAPILLARY: 135 mg/dL — AB (ref 65–99)
Glucose-Capillary: 134 mg/dL — ABNORMAL HIGH (ref 65–99)

## 2016-01-20 MED ORDER — LIDOCAINE HCL (PF) 1 % IJ SOLN
INTRAMUSCULAR | Status: AC
  Filled 2016-01-20: qty 10

## 2016-01-20 MED ORDER — METHYLPREDNISOLONE ACETATE 80 MG/ML IJ SUSP
INTRAMUSCULAR | Status: AC
  Filled 2016-01-20: qty 1

## 2016-01-20 NOTE — Progress Notes (Signed)
Patient ID: Timothy Suarez, male   DOB: 17-May-1925, 82 y.o.   MRN: [id number]  He looks great and has no complaints. His NG feeding tube malfunctioned during the night. He is going for aspiration of popliteal cyst this morning. Stoma is clear. JP removed. Progressing very nicely - will hold tube feeds and will try PO liquids tomorrow.

## 2016-01-20 NOTE — Progress Notes (Signed)
Pt has been suctioning his mouth with the use of yankuer multiple times, suction canister has a creamy colored output , checked the panda tube and it looks way out , called dr. Constance Holster to report with order to hold tube feeding and he'll assess in am.

## 2016-01-20 NOTE — Procedures (Signed)
L knee Baker's cyst aspiration and injection 50 cc joint fluid 80 mg depomedrol No comp/EBL

## 2016-01-20 NOTE — Progress Notes (Signed)
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     PT Comments    Pt able to increase his ambulation to 70 feet with rw, min guard assist during ambulation with +2 for assist with lines and IV pole. Pt using TC on 4L during ambulation with SpO2 at 92-97%. Will continue to follow and progress as tolerated.   Follow Up Recommendations  SNF;Supervision/Assistance - 24 hour     Equipment Recommendations  Wheelchair (measurements PT);Wheelchair cushion (measurements PT);Hospital bed    Recommendations for Other Services       Precautions / Restrictions Precautions Precautions: Fall Precaution Comments: on TC, tube feeding, Baker's cyst left knee Restrictions Weight Bearing Restrictions: No    Mobility  Bed Mobility               General bed mobility comments: up in chair upon arrival  Transfers Overall transfer level: Needs assistance Equipment used: Rolling walker (2 wheeled) Transfers: Sit to/from Stand Sit to Stand: Mod assist         General transfer comment: cues for using chair to push from   Ambulation/Gait Ambulation/Gait assistance: +2 safety/equipment Ambulation Distance (Feet): 70 Feet Assistive device: Rolling walker (2 wheeled) Gait Pattern/deviations: Step-through pattern;Decreased step length - right;Decreased step length - left;Trunk flexed Gait velocity: decreased   General Gait Details: cues for posture   Stairs            Wheelchair Mobility    Modified Rankin (Stroke Patients Only)       Balance Overall balance assessment: Needs assistance Sitting-balance support: No upper extremity supported Sitting balance-Leahy Scale: Fair     Standing balance support: Bilateral upper extremity supported Standing balance-Leahy Scale: Poor Standing balance  comment: using rw                    Cognition Arousal/Alertness: Awake/alert Behavior During Therapy: WFL for tasks assessed/performed Overall Cognitive Status: Within Functional Limits for tasks assessed                      Exercises      General Comments General comments (skin integrity, edema, etc.): Pt using grease board for communication.      Pertinent Vitals/Pain Pain Assessment: Faces Faces Pain Scale: Hurts little more Pain Location: Lt knee Pain Descriptors / Indicators: Grimacing Pain Intervention(s): Limited activity within patient's tolerance;Monitored during session    Home Living                      Prior Function            PT Goals (current goals can now be found in the care plan section) Acute Rehab PT Goals Patient Stated Goal: go home. PT Goal Formulation: With patient/family Time For Goal Achievement: 01/31/16 Potential to Achieve Goals: Fair Progress towards PT goals: Progressing toward goals    Frequency  Min 3X/week    PT Plan Current plan remains appropriate    Co-evaluation             End of Session Equipment Utilized During Treatment: Gait belt;Oxygen Activity Tolerance: Patient tolerated treatment well Patient left: in chair;with call bell/phone within reach     Time: 0840-0911 PT Time Calculation (min) (ACUTE ONLY): 31 min  Charges:  $Gait Training: 23-37 mins  G Codes:      Cassell Clement, PT, CSCS Pager [phone] Office [phone]  01/20/2016, 11:27 AM

## 2016-01-20 NOTE — Progress Notes (Signed)
Patient ID: Timothy Suarez, male   DOB: 1924/10/08, 82 y.o.   MRN: [id number]  Doing great, feels much better since having his knee cyst drained.  Had him drink some water, able to swallow without difficulty and no leak. Continue on liquids.

## 2016-01-20 NOTE — Care Management Important Message (Signed)
Important Message  Patient Details  Name: Timothy Suarez MRN: [id number] Date of Birth: 06/15/1925   Medicare Important Message Given:  Yes    Loann Quill 01/20/2016, 1:58 PM

## 2016-01-20 NOTE — Progress Notes (Signed)
Patient back from drainage of left knee cyst- patient is resting, o2 reconnected, IV restarted, Cont pulse ox applied. Asked to call if in need of anything.

## 2016-01-20 NOTE — Progress Notes (Signed)
Patient walked with 2 RN's for 23ft. Patient tolerated well. Sitting up in chair at this time.

## 2016-01-20 NOTE — Progress Notes (Signed)
SLP Cancellation Note  Patient Details Name: Timothy Suarez MRN: [id number] DOB: [birth year]   Cancelled treatment:       Reason Eval/Treat Not Completed: Patient declined, no reason specified. Attempted visit x 2. Pt at procedure, then pt refused tx citing fatigue.    [place], [place] Pager [phone] 01/20/2016, 2:43 PM

## 2016-01-20 NOTE — Progress Notes (Signed)
Pt with RNs walking at this time. RT was able to listen to BBS clr dim. Pt in no distree ambulating with RNs

## 2016-01-21 LAB — GLUCOSE, CAPILLARY
GLUCOSE-CAPILLARY: 122 mg/dL — AB (ref 65–99)
GLUCOSE-CAPILLARY: 122 mg/dL — AB (ref 65–99)
Glucose-Capillary: 115 mg/dL — ABNORMAL HIGH (ref 65–99)

## 2016-01-21 MED ORDER — BOOST / RESOURCE BREEZE PO LIQD
1.0000 | Freq: Three times a day (TID) | ORAL | Status: DC
  Administered 2016-01-21 – 2016-01-22 (×2): 1 via ORAL

## 2016-01-21 NOTE — Progress Notes (Signed)
Nutrition Follow-up  DOCUMENTATION CODES:   Not applicable  INTERVENTION:   -Boost Breeze po TID, each supplement provides 250 kcal and 9 grams of protein -RD will follow for diet advancement -RD provided education on full liquid diet. Provided "Full Liquid Diet" and "Liquid High Protein Supplementation" handouts from [facility] Nutrition Care Manual  NUTRITION DIAGNOSIS:   Inadequate oral intake related to cancer and cancer related treatments as evidenced by  (clear liquid diet).  Ongoing  GOAL:   Patient will meet greater than or equal to 90% of their needs  Progressing  MONITOR:   PO intake, Supplement acceptance, Diet advancement, Labs, Weight trends, Skin, I & O's  REASON FOR ASSESSMENT:   Consult Diet education  ASSESSMENT:   82 yo Male with PMH of mitral valve prolapse, A-fib, CAD, HTN; about a year post radiation for glottic cancer.  Patient s/p procedures 6/7: TOTAL LARYNGECTOMY DIRECT LARYNGOSCOPY WITH BIOPSY AND FROZEN SECTION  Pt underwent aspiration of lt knee Baker's cyst by IR on 01/20/16.   Case discussed with RN. She confirms that feeding tube was removed on 01/20/16; TF were subsequently discontinued. Pt has been tolerating liquids, no leak per MD.   Per MD notes, plan to advance to full liquid diet. Plan is for pt to remain on full liquid diet 2 weeks after discharge.   Spoke with pt at bedside. He communicates well by mouthing words, gestures, and via writing in dry erase board. He reports good appetite, consuming all of his broth this AM. He is very eager to try solid foods. Discussed plan to advance to full liquids- pt very grateful regarding diet advancement.   Discussed Dr. Janeice Robinson recommendation to remain on full liquid diet for 2 weeks after discharge. Discussed components of clear liquid and full liquid diet, as well as foods allowed on diet, including cream soups, ice cream, coffee, pudding, and ice cream. Discussed possibility of supplementing  diet with Ensure to ensure nutritional adequacy; also provided pt with ideas to add protein to full liquids at home, such as homemade milkshakes. RD provided Provided "Full Liquid Diet" and "Liquid High Protein Supplementation" handouts from [facility] Nutrition Care Manual. Teachback method used. Expect good compliance. Also provided pt with RD contact information and encouraged pt and/or daughters (who will be staying with him at discharge) if further questions or concerns arise.   Pt reports he will be discharged tomorrow.   Labs reviewed: CBGS: 116-134.   Diet Order:  Diet clear liquid Room service appropriate?: Yes; Fluid consistency:: Thin  Skin:  Reviewed, no issues  Last BM:  6/6  Height:   Ht Readings from Last 1 Encounters:  01/15/16 6' (1.829 m)    Weight:   Wt Readings from Last 1 Encounters:  01/21/16 206 lb 12.7 oz (93.8 kg)    Ideal Body Weight:  81 kg  BMI:  Body mass index is 28.04 kg/(m^2).  Estimated Nutritional Needs:   Kcal:  1900-2100  Protein:  90-100 gm  Fluid:  1.9-2.1 L  EDUCATION NEEDS:   Education needs addressed  Charleton Deyoung A. Jimmye Norman, RD, LDN, CDE Pager: [phone] After hours Pager: [phone]

## 2016-01-21 NOTE — Progress Notes (Signed)
Speech Language Pathology Treatment: Cognitive-Linquistic  Patient Details Name: Timothy Suarez MRN: [id number] DOB: February 21, 1925 Today's Date: 01/21/2016 Time: 1520-1550 SLP Time Calculation (min) (ACUTE ONLY): 30 min  Assessment / Plan / Recommendation Clinical Impression  Pt seen for additional education and practice with electrolarynx. Pt continues to demonstrate improved intelligibility of speech at the word level, although still requiring Max faded to Mod cues. Cueing primarily is needed for coordination of vibration with word production and adequate articulation. Pt consistently attempted to limit lingual and dental movement despite cueing to move normally and turned off the vibratory source before his speech production was complete. Discussed that lingual movement is needed to shape speech sounds. Also reviewed POC for daughter to submit paperwork for personal electrolarynx then with followup SLP services at the outpatient level.   HPI HPI: 82 yo Male with PMH of mitral valve prolapse, A-fib, CAD, HTN; about a year post radiation for glottic cancer. He is s/p total laryngectomy on 6/7. No prior SLP notes found in chart, and pt denies getting any SLP services in the past.      SLP Plan  Continue with current plan of care     Recommendations                Follow up Recommendations: Outpatient SLP Plan: Continue with current plan of care     [place] MA, CCC-SLP  01/21/2016, 3:57 PM

## 2016-01-21 NOTE — Clinical Social Work Note (Signed)
CSW received referral for SNF.  Case discussed with case manager, and plan is to discharge home health.  CSW to sign off please re-consult if social work needs arise.  Jones Broom. [place], MSW, [place]

## 2016-01-21 NOTE — Care Management Note (Signed)
Case Management Note  Patient Details  Name: MAVRYK KICE MRN: [id number] Date of Birth: [birth year]  Subjective/Objective:                    Action/Plan:  Spoke with patient's daughter Jackelyn Poling and patient at bedside.    [address] [id number]   Patient will be staying with his daughter  Jackelyn Poling [phone]  Cell 336 [phone]     Ordered hospital bed . Patient already has wheelchair and walker at home .   Suction and suction catheters ordered . Humidified air also ordered ( spoke with Dr Constance Holster) . Will ask nurse to try to wean oxygen .  Outpatient PT and OT ordered through [facility] , [address] , [place], [place] , [place] [address]  Phone [phone]   Angie at [employer] has Debbie's contact information to call to schedule an appointment.  Jackelyn Poling has Pharmacologist information also.    Confirmed Jackelyn Poling can transport patient for appointments.  Jackelyn Poling has completed application for electrolarynx and has ST portion of application . She stated she is going to drop off application today .   Expected Discharge Date:                  Expected Discharge Plan:  Home/Self Care  In-House Referral:     Discharge planning Services  CM Consult  Post Acute Care Choice:    Choice offered to:  Patient, Adult Children  DME Arranged:  Hospital bed, Suction DME Agency:  [place]:    [place]:     Status of Service:  Completed, signed off  Medicare Important Message Given:  Yes Date Medicare IM Given:    Medicare IM give by:    Date Additional Medicare IM Given:    Additional Medicare Important Message give by:     If discussed at [place] of Stay Meetings, dates discussed:    Additional Comments:  [name], RN 01/21/2016, 11:29 AM

## 2016-01-21 NOTE — Progress Notes (Signed)
Spoke with Dr. Janeice Robinson nurse who stated Dr Constance Holster said the patient did not have to get another IV and he did not have to have Q4 finger sticks.  Order to be dc'd.  Will continue to monitor.

## 2016-01-21 NOTE — Progress Notes (Signed)
Patient ID: Timothy Suarez, male   DOB: January 16, 1925, 82 y.o.   MRN: [id number] Doing well, taking water by mouth. Stoma clean. Start full liquids. Anticipate d/c home tomorrow.

## 2016-01-22 MED ORDER — HYDROCODONE-ACETAMINOPHEN 7.5-325 MG/15ML PO SOLN
10.0000 mL | Freq: Four times a day (QID) | ORAL | Status: DC | PRN
Start: 2016-01-22 — End: 2016-03-03

## 2016-01-22 MED ORDER — ZOLPIDEM TARTRATE 5 MG PO TABS: 2.5000 mg | ORAL_TABLET | Freq: Every evening | ORAL | Status: DC | PRN

## 2016-01-22 MED ORDER — AMLODIPINE BESYLATE 2.5 MG PO TABS
2.5000 mg | ORAL_TABLET | Freq: Every day | ORAL | Status: DC
  Administered 2016-01-22: 2.5 mg via ORAL
  Filled 2016-01-22: qty 1

## 2016-01-22 NOTE — Progress Notes (Signed)
Before discharge, pt and daughter shown how to suction stoma site with younker. Pt discharged to home. Expressed understanding.  Discharge instructions explained to pt.  Pt has no questions at the time of discharge.  Pt states he has all belongings.  Pt taken off unit via wheelchair by staff.

## 2016-01-22 NOTE — Discharge Instructions (Signed)
Liquids only. Supplement with Ensure or other supplements. Call Urologist if unable to urinate. Suction stoma as needed.

## 2016-01-22 NOTE — Discharge Summary (Signed)
  Physician Discharge Summary  Patient ID: Timothy Suarez MRN: [id number] DOB/AGE: [birth year] 82 y.o.  Admit date: 01/15/2016 Discharge date: 01/22/2016  Admission Diagnoses:Laryngeal cancer  Discharge Diagnoses:  Active Problems:   S/P laryngectomy   S/P biopsy   Discharged Condition: good  Hospital Course: no complications  Consults: none  Significant Diagnostic Studies: none  Treatments: surgery: total laryngectomy  Discharge Exam: Blood pressure 162/70, pulse 72, temperature 98.1 F (36.7 C), temperature source Oral, resp. rate 16, height 6' (1.829 m), weight 94.1 kg (207 lb 7.3 oz), SpO2 92 %. PHYSICAL EXAM: Stoma clean. Neck excellent.  Disposition: 01-Home or Self Care  Discharge Instructions    Ambulatory referral to Physical Therapy    Complete by:  As directed      Ambulatory referral to Speech Therapy    Complete by:  As directed      Diet full liquid    Complete by:  As directed      Increase activity slowly    Complete by:  As directed             Medication List    TAKE these medications        amLODipine 2.5 MG tablet  Commonly known as:  NORVASC  TAKE 1 TABLET (2.5 MG TOTAL) BY MOUTH DAILY. FOR BLOOD PRESSURE     atorvastatin 40 MG tablet  Commonly known as:  LIPITOR  TAKE 1 TABLET BY MOUTH DAILY AT 6PM FOR CHOLESTEROL     HYDROcodone-acetaminophen 7.5-325 mg/15 ml solution  Commonly known as:  HYCET  Take 10 mLs by mouth 4 (four) times daily as needed for moderate pain.     travoprost (benzalkonium) 0.004 % ophthalmic solution  Commonly known as:  TRAVATAN  Place 1 drop into both eyes at bedtime.     Vitamin D 2000 units Caps  Take one capsule by mouth once daily     zolpidem 5 MG tablet  Commonly known as:  AMBIEN  Take 1 tablet (5 mg total) by mouth at bedtime as needed. for sleep           Follow-up Information    Call [facility].   Specialty:  Rehabilitation   Contact  information:   [address] [place] [id number] [place] [id number] [phone]      Follow up with Izora Gala, MD. Schedule an appointment as soon as possible for a visit in 2 weeks.   Specialty:  Otolaryngology   Contact information:   [address] [place] [address] [phone]       Signed: Izora Gala 01/22/2016, 1:37 PM

## 2016-01-22 NOTE — Progress Notes (Signed)
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     PT Comments    Pt making great progress with overall mobility and improving endurance. Pt walking > 150' with RW with light min assist and PT to assist with O2 tank management. Planning to d/c home with daughter who was present during session and denied any concerns. Planning to go to outpatient PT and this has already been set-up. Pt will continue to benefit from skilled PT to address balance, endurance, activity tolerance, and general strength and mobility.     Follow Up Recommendations  Supervision/Assistance - 24 hour;Outpatient PT     Equipment Recommendations   (Pt getting hospital bed. Has RW and w/c at home already)    Recommendations for Other Services       Precautions / Restrictions Precautions Precautions: Fall Precaution Comments: trach collar; had L knee drained while here and symptoms improved Restrictions Weight Bearing Restrictions: No    Mobility  Bed Mobility Overal bed mobility: Needs Assistance Bed Mobility: Supine to Sit     Supine to sit: Min guard     General bed mobility comments: Light min assist to elevate trunk when coming to EOB  Transfers Overall transfer level: Needs assistance Equipment used: Rolling walker (2 wheeled) Transfers: Sit to/from [facility] Sit to Stand: Min guard;Min assist Stand pivot transfers: Min guard       General transfer comment: cues for correct hand placement. Pt able to get up from low couch in hallway with light steadying assist  Ambulation/Gait Ambulation/Gait assistance: Min assist Ambulation Distance (Feet): 300 Feet (and another 100) Assistive device: Rolling walker (2 wheeled) Gait Pattern/deviations: Step-through pattern;Trunk flexed     General Gait  Details: cues for posture. One mild LOB with steady assist to recover when first getting going   Financial trader Rankin (Stroke Patients Only)       Balance     Sitting balance-Leahy Scale: Good     Standing balance support: Single extremity supported;Bilateral upper extremity supported;No upper extremity supported;During functional activity Standing balance-Leahy Scale: Fair                      Cognition Arousal/Alertness: Awake/alert Behavior During Therapy: WFL for tasks assessed/performed Overall Cognitive Status: Within Functional Limits for tasks assessed                      Exercises      General Comments General comments (skin integrity, edema, etc.): Pt coughing during mobility but sats and HR remained WFL. O2 > 95% and HR = 86-89 bpm      Pertinent Vitals/Pain Pain Assessment: No/denies pain    Home Living                      Prior Function            PT Goals (current goals can now be found in the care plan section) Acute Rehab PT Goals PT Goal Formulation: With patient/family Time For Goal Achievement: 01/31/16 Potential to Achieve Goals: Good Progress towards PT goals: Progressing toward goals    Frequency  Min 3X/week    PT Plan Discharge plan needs to be updated    Co-evaluation  End of Session Equipment Utilized During Treatment: Oxygen Activity Tolerance: Patient tolerated treatment well Patient left: in chair;with call bell/phone within reach;with family/visitor present     Time: [id number] PT Time Calculation (min) (ACUTE ONLY): 25 min  Charges:  $Gait Training: 8-22 mins $Therapeutic Activity: 8-22 mins                    G Codes:      [name], PT, DPT Pager #: [phone]  01/22/2016, 11:34 AM

## 2016-01-27 ENCOUNTER — Ambulatory Visit: Payer: Medicare Other | Attending: Internal Medicine | Admitting: Rehabilitative and Restorative Service Providers"

## 2016-01-27 DIAGNOSIS — R2689 Other abnormalities of gait and mobility: Secondary | ICD-10-CM

## 2016-01-27 DIAGNOSIS — M6281 Muscle weakness (generalized): Secondary | ICD-10-CM | POA: Diagnosis present

## 2016-01-27 DIAGNOSIS — M25562 Pain in left knee: Secondary | ICD-10-CM | POA: Diagnosis present

## 2016-01-27 NOTE — Patient Instructions (Signed)
Functional Quadriceps: Sit to Stand    Sit on edge of chair, feet flat on floor. Stand upright, extending knees fully.  USE YOUR HANDS TO HELP FOR NOW. Repeat _10___ times per set. Do __1__ sets per session. Do __2__ sessions per day.  http://orth.exer.us/734   Copyright  VHI. All rights reserved.  Hamstring Stretch, Seated (Strap, Two Chairs)    Sit with one leg extended onto facing chair. No LOOP. Lengthen spine. Hold for __20 seconds. Repeat __3__ times each leg.  Copyright  VHI. All rights reserved.   Heel Raise: Bilateral (Standing)    Rise on balls of feet. HOLD ONTO COUNTERTOP FOR SUPPORT Repeat __20__ times per set. Do __1__ sets per session. Do __2__ sessions per day.  http://orth.exer.us/38   Copyright  VHI. All rights reserved.

## 2016-01-29 ENCOUNTER — Ambulatory Visit: Payer: Medicare Other | Admitting: Physical Therapy

## 2016-01-29 NOTE — Therapy (Signed)
[facility] [address] [place], [place], [address] Phone: [phone]   Fax:  [phone]  Physical Therapy Evaluation  Patient Details  Name: Timothy Suarez MRN: [id number] Date of Birth: Jan 04, 1925 Referring Provider: Referred by hospitalist, will f/u with Hollace Kinnier, MD  Encounter Date: 01/27/2016      PT End of Session - 01/29/16 1059    Visit Number 1   Number of Visits 16   Date for PT Re-Evaluation 03/29/16   Authorization Type G code every 10th visit   Equipment Utilized During Treatment Gait belt   Activity Tolerance Patient tolerated treatment well   Behavior During Therapy [facility] for tasks assessed/performed      Past Medical History  Diagnosis Date  . MVP (mitral valve prolapse)     S/P Rt mini thoractomy for Mitral Valve repair  . Paroxysmal atrial fibrillation (HCC)     S/P Maze procedure  . Depression   . Hernia   . Aortic valve insufficiency, acquired   . Dyslipidemia   . Glaucoma   . BPH (benign prostatic hyperplasia)   . Heart valve replaced   . A-fib ([place])   . Cerebral ischemia   . Sleep disturbance   . Mild cognitive impairment   . Thoracic aortic aneurysm ([place])   . Major depression ([place])   . Hyperlipidemia   . History of echocardiogram     Echo 5/16:  Mild LVH, EF 50-55%, Gr 1 DD, septal HK, Ao sclerosis without stenosis, mild AI, MV repair ok with borderline mild MS (mean 4 mmHg), mild MR, mild LAE, mod RAE, mild TR, PASP 30 mmHg  . Hx of cardiovascular stress test     Lexiscan Myoview 5/16:  Apical thinning, EF not gated, no ischemia. Low Risk  . Coronary artery disease     50-70% LAD mitral valve repair 4+ yrs ago  . Shortness of breath dyspnea     with exertion  . HTN (hypertension)     takes Amlodipine daily  . Arthritis   . Colon cancer [facility])     colon s/p partial colectomy  . Prostate cancer ([place])   . 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 Urinary urgency   . Urinary frequency   . Nocturia     Past Surgical History  Procedure Laterality Date  . Flexhd patch repair of chest wall hernia.  01/28/11    [name]  . Colectomy  2004    Dr [name]  . Tympanoplasty    . Mitral valve repair  12/27/2008    complex valvuloplasty with 57mm Memo 3D annuloplasty ring via right minithoracotomy  . Maze  12/27/2008    left side lesion set  . Mitral valve repair      minimally invasive  . Tee without cardioversion N/A 01/03/2013    Procedure: TRANSESOPHAGEAL ECHOCARDIOGRAM (TEE);  Surgeon: Sueanne Margarita, MD;  Location: [facility];  Service: Cardiovascular;  Laterality: N/A;  . Total knee arthroplasty Right 2003  . Microlaryngoscopy Right 01/29/2015    Procedure: MICROLARYNGOSCOPY WITH BIOSPY OF RIGHT VOCAL CORD;  Surgeon: Izora Gala, MD;  Location: [facility];  Service: ENT;  Laterality: Right;  . Prostate biopsy    . Laryngetomy N/A 01/15/2016    Procedure:  TOTAL LARYNGECTOMY;  Surgeon: Izora Gala, MD;  Location: [facility];  Service: ENT;  Laterality: N/A;  . Direct laryngoscopy N/A 01/15/2016    Procedure: DIRECT LARYNGOSCOPY WITH  BIOPSY AND FROZEN SECTION;  Surgeon: Izora Gala, MD;  Location: [facility];  Service: ENT;  Laterality: N/A;    There were no vitals filed for this visit.       Subjective Assessment - 01/28/16 0800    Subjective The patient presents to PT today for L knee pain due to popliteal cyst.  He was recently hospitalized due to oral/throat cancer and is unable to speak.  Our communication today was through written question/answer.  He reports his main complaint is difficulty walking at home when fatigued.     Pertinent History Recent surgery for throat cancer.   TRACH, non-verbal due to recent surgery.   Patient Stated Goals Improve endurance at home, improve mobility, reduce knee pain.    Currently in Pain? No/denies   Pain Score --  "the pain is not so bad, just tire easily."            [facility] PT Assessment -  01/28/16 1101    Assessment   Medical Diagnosis Popliteal cyst, abnormality of gait   Referring Provider Referred by hospitalist, will f/u with Hollace Kinnier, MD   Onset Date/Surgical Date 01/21/16   Prior Therapy Acute care   Precautions   Precautions Fall;Other (comment)  tracheostomy, non-verbal/ communicates with writing   Balance Screen   Has the patient fallen in the past 6 months Yes   How many times? 1  "bad fall"   Has the patient had a decrease in activity level because of a fear of falling?  No   Is the patient reluctant to leave their home because of a fear of falling?  Yes   [place] residence  at [place] - 4 wheels;Cane - single point;Grab bars - tub/shower;Toilet riser;Shower seat   Additional Comments fully accessible apartment   Prior Function   Level of Independence Independent with basic ADLs  used walker x 2 months   Observation/Other Assessments   Focus on Therapeutic Outcomes (FOTO)  n/a- not captured at evaluation   Sensation   Light Touch Appears Intact   Posture/Postural Control   Posture Comments tightness noted in hip flexors with standing with trunk flexion observed   ROM / Strength   AROM / PROM / Strength AROM;Strength   AROM   Overall AROM  Within functional limits for tasks performed   Strength   Overall Strength Comments Bilateral UEs grossly 5/5 for shoulder flexion/abduction, elbow flexion/extension and grip strength.  R LE is 5/5 for hip flexion, knee flexion/extension and 4/5 ankle dorsiflexion.  L hip flexion 4/5, L knee extension and flexion 4/5, ankle dorsiflexion 5/5.   Transfers   Transfers Sit to Stand   Sit to Stand 5: Supervision   Comments needs UEs and multiple attempts for sit>stand   Ambulation/Gait   Ambulation/Gait Yes   Ambulation/Gait Assistance 5: Supervision    Ambulation/Gait Assistance Details R foot catches ground multiple times, patient able to self recover when using rollater RW.   Ambulation Distance (Feet) 200 Feet   Assistive device Rollator   Gait Pattern Poor foot clearance - left;Poor foot clearance - right;Trunk flexed;Decreased stride length   Gait velocity 2.06 ft/sec   Stairs No   Standardized Balance Assessment   Standardized Balance Assessment Berg Balance Test;Timed Up and Go Test   Berg Balance Test   Sit to Stand Able to stand using  hands after several tries   Standing Unsupported Able to stand safely 2 minutes   Sitting with Back Unsupported but Feet Supported on Floor or Stool Able to sit safely and securely 2 minutes   Stand to Sit Uses backs of legs against chair to control descent   Transfers Able to transfer safely, definite need of hands   Standing Unsupported with Eyes Closed Able to stand 3 seconds   Standing Ubsupported with Feet Together Needs help to attain position but able to stand for 30 seconds with feet together   From Standing, Reach Forward with Outstretched Arm Reaches forward but needs supervision   From Standing Position, Pick up Object from Floor Able to pick up shoe, needs supervision   From Standing Position, Turn to Look Behind Over each Shoulder Turn sideways only but maintains balance   Turn 360 Degrees Needs close supervision or verbal cueing   Standing Unsupported, Alternately Place Feet on Step/Stool Able to complete >2 steps/needs minimal assist   Standing Unsupported, One Foot in Front Able to take small step independently and hold 30 seconds   Standing on One Leg Unable to try or needs assist to prevent fall   Total Score 28   Berg comment: 28/56.   Timed Up and Go Test   TUG --  18.97 seconds with rollater RW and SBA.           PT Education - 01/28/16 1059    Education provided Yes   Education Details HEP: heel raises, hamstring stretch, sit<>stand   Person(s) Educated Patient    Methods Explanation;Demonstration;Handout   Comprehension Returned demonstration  non0-verbal communication          PT Short Term Goals - 01/29/16 1059    PT SHORT TERM GOAL #1   Title The patient will return demo HEP indep with written cues.   Baseline Target date 02/25/2016   Time 4   Period Weeks   PT SHORT TERM GOAL #2   Title The patient will improve Berg score from 28/56 to > or equal to 35/56 to demo dec'ing risk for falls.   Baseline Target date 02/25/2016   Time 4   Period Weeks   PT SHORT TERM GOAL #3   Title The patient will move sit<>stand with UEs 5/5 trials to demo improved consistency with safe transfers.   Baseline Target date 02/25/2016   Time 4   Period Weeks   PT SHORT TERM GOAL #4   Title The patient will improve gait speed from 2.06 ft/sec to > or equal to 2.5 ft/sec to demo improving mobility.   Baseline Target date 02/25/2016   Time 4   Period Weeks   PT SHORT TERM GOAL #5   Title The patient will ambulate nonstop with rollater RW x 400 ft modified indep for improved facility negotiation.   Baseline Target date 02/25/2016   Time 4   Period Weeks           PT Long Term Goals - 01/29/16 1059    PT LONG TERM GOAL #1   Title The patient will return demo HEP with written cues.   Baseline Target date 03/26/2016   Time 8   Period Weeks   PT LONG TERM GOAL #2   Title The patient will imrpove gait speed from 2.06 ft/sec to > or equal to 2.8 ft/sec to demo improving mobility.   Baseline Target date 03/26/2016   Time 8   Period Weeks   PT LONG  TERM GOAL #3   Title The patient will improve Berg from 28/56 to > or equal to 38/56 to demo decreasing risk for falls.   Baseline Target date 03/26/2016   Time 8   Period Weeks   PT LONG TERM GOAL #4   Title The patient will reduce TUG score from 18.97 seconds to < or equal to 15 seconds to demo improving functional mobility.   Baseline Target date 03/26/2016   Time 8   Period Weeks   PT LONG TERM GOAL #5    Title The patient will move sit<>stand without UE support 5/5 times for improved transfers from multiple surfaces in home.   Baseline Target date 03/26/2016   Time 8   Period Weeks               Plan - 01/29/16 1100    Clinical Impression Statement The patient is an 82 year old male that presents to OP PT due to overall deconditioning, weakness, imbalance, declining mobility, and intermittent L knee pain.  In the hospital, per medical chart review, it appears the L knee limited ambulation.  He reports this has improved somewhat since d/c home, however when he is fatigued he notes worsening L knee pain.  At today's evaluation, PT performed evaluation from focus of safe household ambulation and improving safety with functional tasks.  Will monitor L knee pain.    Rehab Potential Good   Clinical Impairments Affecting Rehab Potential memory changes- patient reports difficulty with short term memory   PT Frequency 2x / week   PT Duration 8 weeks   PT Treatment/Interventions ADLs/Self Care Home Management;Balance training;Neuromuscular re-education;Patient/family education;Gait training;Functional mobility training;Therapeutic activities;Therapeutic exercise;Manual techniques   PT Next Visit Plan Review HEP, endurance, home program, stretching hamstrings/ hip flexors.  Add to HEP.  *monitor L knee pain.   Consulted and Agree with Plan of Care Patient      Patient will benefit from skilled therapeutic intervention in order to improve the following deficits and impairments:  Abnormal gait, Decreased activity tolerance, Decreased balance, Decreased mobility, Decreased strength, Postural dysfunction, Impaired flexibility, Pain, Difficulty walking  Visit Diagnosis: Muscle weakness (generalized) - Plan: PT plan of care cert/re-cert  Other abnormalities of gait and mobility - Plan: PT plan of care cert/re-cert  Pain in left knee - Plan: PT plan of care cert/re-cert     Problem List Patient  Active Problem List   Diagnosis Date Noted  . S/P laryngectomy 01/15/2016  . S/P biopsy 01/15/2016  . Vascular dementia with depressed mood 12/09/2015  . Baker's cyst of knee 12/09/2015  . Other fatigue 12/09/2015  . 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 Prostate cancer ([place]) 01/10/2015  . S/P MVR (mitral valve repair) 12/20/2014  . S/P Maze operation for atrial fibrillation 12/20/2014  . Obesity (BMI 30-39.9) 04/02/2014  . Prolonged Q-T interval on ECG 04/02/2014  . Mild cognitive impairment 04/02/2014  . Major depressive disorder, recurrent episode, mild ([place]) 02/08/2014  . Insomnia 02/08/2014  . Dizziness and giddiness 02/08/2014  . Generalized muscle weakness 02/08/2014  . Cerumen impaction 02/08/2014  . Glaucoma 02/08/2014  . Colon cancer ([place])   . Coronary artery disease   . Moderate aortic insufficiency 01/03/2013  . Mild mitral regurgitation 01/03/2013  . MVP (mitral valve prolapse)   . Paroxysmal atrial fibrillation (HCC)   . HTN (hypertension)   . Depression   . chest wall hernia (lung hernia)     Filipe Greathouse,  PT 01/29/2016, 11:03 AM  [facility] [address] [place], [place], [address] Phone: [phone]   Fax:  [phone]  Name: Timothy Suarez MRN: [id number] Date of Birth: [birth year]

## 2016-02-03 ENCOUNTER — Encounter: Payer: Medicare Other | Admitting: Speech Pathology

## 2016-02-05 ENCOUNTER — Ambulatory Visit: Payer: Medicare Other | Admitting: Rehabilitative and Restorative Service Providers"

## 2016-02-06 ENCOUNTER — Ambulatory Visit: Payer: Medicare Other | Admitting: Internal Medicine

## 2016-02-07 ENCOUNTER — Ambulatory Visit: Payer: Medicare Other | Admitting: Rehabilitative and Restorative Service Providers"

## 2016-02-10 ENCOUNTER — Ambulatory Visit: Payer: Medicare Other | Admitting: Rehabilitation

## 2016-02-14 ENCOUNTER — Ambulatory Visit: Payer: Medicare Other | Admitting: Rehabilitative and Restorative Service Providers"

## 2016-02-14 ENCOUNTER — Encounter: Payer: Self-pay | Admitting: Rehabilitative and Restorative Service Providers"

## 2016-02-14 NOTE — Therapy (Signed)
[facility] [address] [place], [place], [address] Phone: [phone]   Fax:  [phone]  Patient Details  Name: Timothy Suarez MRN: [id number] Date of Birth: 24-Nov-1924  Encounter Date: last encounter 01/29/2016  PHYSICAL THERAPY DISCHARGE SUMMARY  Visits from Start of Care: 1 (eval only)  Current functional level related to goals / functional outcomes: See initial evaluation for patient status.  No treatment performed beyond first visit (taught HEP).  Patient cancelled remaining visits due to receiving therapy at independent living facility.   Remaining deficits: See initial summary   Education / Equipment: Initial HEP  Plan: Patient agrees to discharge.  Patient goals were not met. Patient is being discharged due to the patient's request.  ?????Changed services to receive PT at independent living facility.        Thank you for the referral of this patient. Rudell Cobb, MPT   Timothy Suarez 02/14/2016, 8:06 AM  [facility] [address] [place], [place], [address] Phone: [phone]   Fax:  [phone]

## 2016-02-17 ENCOUNTER — Other Ambulatory Visit: Payer: Self-pay | Admitting: *Deleted

## 2016-02-17 ENCOUNTER — Ambulatory Visit: Payer: Medicare Other | Admitting: Rehabilitative and Restorative Service Providers"

## 2016-02-17 MED ORDER — ZOLPIDEM TARTRATE 5 MG PO TABS: 5.0000 mg | ORAL_TABLET | Freq: Every evening | ORAL | Status: DC | PRN

## 2016-02-17 NOTE — Telephone Encounter (Signed)
Timothy Suarez Timothy Suarez 

## 2016-02-19 ENCOUNTER — Ambulatory Visit: Payer: Medicare Other | Admitting: Rehabilitative and Restorative Service Providers"

## 2016-02-21 ENCOUNTER — Inpatient Hospital Stay (HOSPITAL_COMMUNITY)
Admission: EM | Admit: 2016-02-21 | Discharge: 2016-03-03 | DRG: 154 | Disposition: A | Discharge status: Skilled Nursing Facility | Payer: Medicare Other | Attending: Otolaryngology | Admitting: Otolaryngology

## 2016-02-21 ENCOUNTER — Emergency Department (HOSPITAL_COMMUNITY): Payer: Medicare Other

## 2016-02-21 DIAGNOSIS — R1310 Dysphagia, unspecified: Secondary | ICD-10-CM | POA: Diagnosis present

## 2016-02-21 DIAGNOSIS — I6782 Cerebral ischemia: Secondary | ICD-10-CM | POA: Diagnosis present

## 2016-02-21 DIAGNOSIS — I480 Paroxysmal atrial fibrillation: Secondary | ICD-10-CM | POA: Diagnosis present

## 2016-02-21 DIAGNOSIS — E43 Unspecified severe protein-calorie malnutrition: Secondary | ICD-10-CM | POA: Diagnosis present

## 2016-02-21 DIAGNOSIS — Z4659 Encounter for fitting and adjustment of other gastrointestinal appliance and device: Secondary | ICD-10-CM

## 2016-02-21 DIAGNOSIS — F329 Major depressive disorder, single episode, unspecified: Secondary | ICD-10-CM | POA: Diagnosis present

## 2016-02-21 DIAGNOSIS — Z85038 Personal history of other malignant neoplasm of large intestine: Secondary | ICD-10-CM | POA: Diagnosis not present

## 2016-02-21 DIAGNOSIS — H409 Unspecified glaucoma: Secondary | ICD-10-CM | POA: Diagnosis present

## 2016-02-21 DIAGNOSIS — Z8521 Personal history of malignant neoplasm of larynx: Secondary | ICD-10-CM

## 2016-02-21 DIAGNOSIS — Z6825 Body mass index (BMI) 25.0-25.9, adult: Secondary | ICD-10-CM | POA: Diagnosis not present

## 2016-02-21 DIAGNOSIS — J392 Other diseases of pharynx: Secondary | ICD-10-CM | POA: Diagnosis present

## 2016-02-21 DIAGNOSIS — N400 Enlarged prostate without lower urinary tract symptoms: Secondary | ICD-10-CM | POA: Diagnosis present

## 2016-02-21 DIAGNOSIS — I712 Thoracic aortic aneurysm, without rupture: Secondary | ICD-10-CM | POA: Diagnosis present

## 2016-02-21 DIAGNOSIS — I10 Essential (primary) hypertension: Secondary | ICD-10-CM | POA: Diagnosis present

## 2016-02-21 DIAGNOSIS — Z0189 Encounter for other specified special examinations: Secondary | ICD-10-CM

## 2016-02-21 DIAGNOSIS — E7800 Pure hypercholesterolemia, unspecified: Secondary | ICD-10-CM | POA: Diagnosis present

## 2016-02-21 DIAGNOSIS — Z952 Presence of prosthetic heart valve: Secondary | ICD-10-CM | POA: Diagnosis not present

## 2016-02-21 DIAGNOSIS — Z8546 Personal history of malignant neoplasm of prostate: Secondary | ICD-10-CM | POA: Diagnosis not present

## 2016-02-21 DIAGNOSIS — I351 Nonrheumatic aortic (valve) insufficiency: Secondary | ICD-10-CM | POA: Diagnosis present

## 2016-02-21 DIAGNOSIS — I2510 Atherosclerotic heart disease of native coronary artery without angina pectoris: Secondary | ICD-10-CM | POA: Diagnosis present

## 2016-02-21 HISTORY — DX: Other diseases of pharynx: J39.2

## 2016-02-21 HISTORY — DX: Other complications of anesthesia, initial encounter: T88.59XA

## 2016-02-21 HISTORY — DX: Adverse effect of unspecified anesthetic, initial encounter: T41.45XA

## 2016-02-21 HISTORY — DX: Malignant neoplasm of glottis: C32.0

## 2016-02-21 LAB — CBC
HCT: 38.3 % — ABNORMAL LOW (ref 39.0–52.0)
Hemoglobin: 12.4 g/dL — ABNORMAL LOW (ref 13.0–17.0)
MCH: 31.1 pg (ref 26.0–34.0)
MCHC: 32.4 g/dL (ref 30.0–36.0)
MCV: 96 fL (ref 78.0–100.0)
PLATELETS: 203 10*3/uL (ref 150–400)
RBC: 3.99 MIL/uL — AB (ref 4.22–5.81)
RDW: 13.8 % (ref 11.5–15.5)
WBC: 8.3 10*3/uL (ref 4.0–10.5)

## 2016-02-21 LAB — COMPREHENSIVE METABOLIC PANEL
ALT: 14 U/L — AB (ref 17–63)
ANION GAP: 5 (ref 5–15)
AST: 19 U/L (ref 15–41)
Albumin: 3.4 g/dL — ABNORMAL LOW (ref 3.5–5.0)
Alkaline Phosphatase: 123 U/L (ref 38–126)
BUN: 27 mg/dL — ABNORMAL HIGH (ref 6–20)
CHLORIDE: 111 mmol/L (ref 101–111)
CO2: 24 mmol/L (ref 22–32)
CREATININE: 1.22 mg/dL (ref 0.61–1.24)
Calcium: 9 mg/dL (ref 8.9–10.3)
GFR, EST AFRICAN AMERICAN: 59 mL/min — AB (ref >60)
GFR, EST NON AFRICAN AMERICAN: 51 mL/min — AB (ref >60)
Glucose, Bld: 104 mg/dL — ABNORMAL HIGH (ref 65–99)
Potassium: 3.7 mmol/L (ref 3.5–5.1)
SODIUM: 140 mmol/L (ref 135–145)
Total Bilirubin: 0.6 mg/dL (ref 0.3–1.2)
Total Protein: 6.8 g/dL (ref 6.5–8.1)

## 2016-02-21 MED ORDER — MORPHINE SULFATE (PF) 2 MG/ML IV SOLN
2.0000 mg | INTRAVENOUS | Status: DC | PRN
  Administered 2016-02-22 – 2016-02-23 (×3): 2 mg via INTRAVENOUS
  Filled 2016-02-21 (×3): qty 1

## 2016-02-21 MED ORDER — KCL IN DEXTROSE-NACL 20-5-0.45 MEQ/L-%-% IV SOLN
INTRAVENOUS | Status: DC
  Administered 2016-02-22: 100 mL/h via INTRAVENOUS
  Administered 2016-02-23 (×2): via INTRAVENOUS
  Filled 2016-02-21 (×3): qty 1000

## 2016-02-21 MED ORDER — CLINDAMYCIN PHOSPHATE 600 MG/50ML IV SOLN
600.0000 mg | Freq: Three times a day (TID) | INTRAVENOUS | Status: DC
  Administered 2016-02-22 – 2016-02-29 (×21): 600 mg via INTRAVENOUS
  Filled 2016-02-21 (×28): qty 50

## 2016-02-21 MED ORDER — ATORVASTATIN CALCIUM 40 MG PO TABS
40.0000 mg | ORAL_TABLET | Freq: Every day | ORAL | Status: DC
  Administered 2016-02-22 – 2016-02-28 (×5): 40 mg via NASOGASTRIC
  Filled 2016-02-21 (×5): qty 1

## 2016-02-21 MED ORDER — LEVOTHYROXINE SODIUM 50 MCG PO TABS
50.0000 ug | ORAL_TABLET | Freq: Every day | ORAL | Status: DC
  Administered 2016-02-24 – 2016-02-28 (×4): 50 ug via NASOGASTRIC
  Filled 2016-02-21 (×4): qty 1

## 2016-02-21 MED ORDER — ZOLPIDEM TARTRATE 5 MG PO TABS
2.5000 mg | ORAL_TABLET | Freq: Every evening | ORAL | Status: DC | PRN
  Administered 2016-02-26: 2.5 mg
  Filled 2016-02-21: qty 1

## 2016-02-21 MED ORDER — HYDROCODONE-ACETAMINOPHEN 7.5-325 MG/15ML PO SOLN: 10.0000 mL | ORAL | Status: DC | PRN

## 2016-02-21 MED ORDER — JEVITY 1.2 CAL PO LIQD
1000.0000 mL | ORAL | Status: DC
  Administered 2016-02-22: 1000 mL
  Filled 2016-02-21 (×4): qty 1000

## 2016-02-21 MED ORDER — AMLODIPINE BESYLATE 2.5 MG PO TABS
2.5000 mg | ORAL_TABLET | Freq: Every day | ORAL | Status: DC
  Administered 2016-02-25 – 2016-02-29 (×4): 2.5 mg via NASOGASTRIC
  Filled 2016-02-21 (×3): qty 1

## 2016-02-21 MED ORDER — VITAMIN D 1000 UNITS PO TABS: 2000.0000 [IU] | ORAL_TABLET | Freq: Every day | ORAL | Status: DC

## 2016-02-21 NOTE — ED Notes (Signed)
ENT at bedside

## 2016-02-21 NOTE — ED Notes (Signed)
Portable in room.  

## 2016-02-21 NOTE — H&P (Signed)
Timothy Suarez is an 82 y.o. male.   Chief Complaint: Pharyngocutaneous fistula HPI: 82 year old male underwent salvage laryngectomy 01/15/16.  He seemed to be doing well and was swallowing a regular diet.  He has noticed some worsening of secretions in the airway over the past week.  Today, his speech pathologist observed water leaking from the incision after swallowing and he was instructed to come to the ER for admission.  He has no other complaints and feels well otherwise.  Past Medical History  Diagnosis Date  . MVP (mitral valve prolapse)     S/P Rt mini thoractomy for Mitral Valve repair  . Paroxysmal atrial fibrillation (HCC)     S/P Maze procedure  . Depression   . Hernia   . Aortic valve insufficiency, acquired   . Dyslipidemia   . Glaucoma   . BPH (benign prostatic hyperplasia)   . Heart valve replaced   . A-fib ([place])   . Cerebral ischemia   . Sleep disturbance   . Mild cognitive impairment   . Thoracic aortic aneurysm ([place])   . Major depression ([place])   . Hyperlipidemia   . History of echocardiogram     Echo 5/16:  Mild LVH, EF 50-55%, Gr 1 DD, septal HK, Ao sclerosis without stenosis, mild AI, MV repair ok with borderline mild MS (mean 4 mmHg), mild MR, mild LAE, mod RAE, mild TR, PASP 30 mmHg  . Hx of cardiovascular stress test     Lexiscan Myoview 5/16:  Apical thinning, EF not gated, no ischemia. Low Risk  . Coronary artery disease     50-70% LAD mitral valve repair 4+ yrs ago  . Shortness of breath dyspnea     with exertion  . HTN (hypertension)     takes Amlodipine daily  . Arthritis   . Colon cancer [facility])     colon s/p partial colectomy  . Prostate cancer ([place])   . 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 Urinary urgency   . Urinary frequency   . Nocturia     Past Surgical History  Procedure Laterality Date  . Flexhd patch repair of chest wall hernia.  01/28/11    [name]  . Colectomy  2004    Dr [name]  .  Tympanoplasty    . Mitral valve repair  12/27/2008    complex valvuloplasty with 39m Memo 3D annuloplasty ring via right minithoracotomy  . Maze  12/27/2008    left side lesion set  . Mitral valve repair      minimally invasive  . Tee without cardioversion N/A 01/03/2013    Procedure: TRANSESOPHAGEAL ECHOCARDIOGRAM (TEE);  Surgeon: TSueanne Margarita MD;  [facility] [facility]  Service: Cardiovascular;  Laterality: N/A;  . Total knee arthroplasty Right 2003  . Microlaryngoscopy Right 01/29/2015    Procedure: MICROLARYNGOSCOPY WITH BIOSPY OF RIGHT VOCAL CORD;  Surgeon: JIzora Gala MD;  [facility] [facility]  Service: ENT;  Laterality: Right;  . Prostate biopsy    . Laryngetomy N/A 01/15/2016    Procedure:  TOTAL LARYNGECTOMY;  Surgeon: JIzora Gala MD;  [place] [facility]  Service: ENT;  Laterality: N/A;  . Direct laryngoscopy N/A 01/15/2016    Procedure: DIRECT LARYNGOSCOPY WITH BIOPSY AND FROZEN SECTION;  Surgeon: JIzora Gala MD;  [facility]  Service: ENT;  Laterality: N/A;    Family History  Problem Relation Age of Onset  . Cancer Mother  lymphoma  . Cancer Father     pancreatic  . Heart attack Neg Hx   . Stroke Neg Hx    Social History:  reports that he has never smoked. He has never used smokeless tobacco. He reports that he drinks alcohol. He reports that he does not use illicit drugs.  Allergies:  Allergies  Allergen Reactions  . Zoloft [Sertraline Hcl] Other (See Comments)    dizzy  . Citalopram Nausea Only  . Trazodone And Nefazodone Other (See Comments)    Dry mouth     (Not in a hospital admission)  Results for orders placed or performed during the hospital encounter of 02/21/16 (from the past 48 hour(s))  CBC     Status: Abnormal   Collection Time: 02/21/16  6:23 PM  Result Value Ref Range   WBC 8.3 4.0 - 10.5 K/uL   RBC 3.99 (L) 4.22 - 5.81 MIL/uL   Hemoglobin 12.4 (L) 13.0 - 17.0 g/dL   HCT 38.3 (L) 39.0 - 52.0 %   MCV 96.0 78.0 - 100.0 fL   MCH 31.1  26.0 - 34.0 pg   MCHC 32.4 30.0 - 36.0 g/dL   RDW 13.8 11.5 - 15.5 %   Platelets 203 150 - 400 K/uL  Comprehensive metabolic panel     Status: Abnormal   Collection Time: 02/21/16  6:23 PM  Result Value Ref Range   Sodium 140 135 - 145 mmol/L   Potassium 3.7 3.5 - 5.1 mmol/L   Chloride 111 101 - 111 mmol/L   CO2 24 22 - 32 mmol/L   Glucose, Bld 104 (H) 65 - 99 mg/dL   BUN 27 (H) 6 - 20 mg/dL   Creatinine, Ser 1.22 0.61 - 1.24 mg/dL   Calcium 9.0 8.9 - 10.3 mg/dL   Total Protein 6.8 6.5 - 8.1 g/dL   Albumin 3.4 (L) 3.5 - 5.0 g/dL   AST 19 15 - 41 U/L   ALT 14 (L) 17 - 63 U/L   Alkaline Phosphatase 123 38 - 126 U/L   Total Bilirubin 0.6 0.3 - 1.2 mg/dL   GFR calc non Af Amer 51 (L) >60 mL/min   GFR calc Af Amer 59 (L) >60 mL/min    Comment: (NOTE) The eGFR has been calculated using the CKD EPI equation. This calculation has not been validated in all clinical situations. eGFR's persistently <60 mL/min signify possible Chronic Kidney Disease.    Anion gap 5 5 - 15   No results found.  Review of Systems  All other systems reviewed and are negative.   Blood pressure 143/82, pulse 26, temperature 98.2 F (36.8 C), temperature source Oral, resp. rate 14, height 6' (1.829 m), weight 93.895 kg (207 lb), SpO2 98 %. Physical Exam  Constitutional: He is oriented to person, place, and time. He appears well-developed and well-nourished. No distress.  HENT:  Head: Normocephalic and atraumatic.  Right Ear: External ear normal.  Left Ear: External ear normal.  Nose: Nose normal.  Mouth/Throat: Oropharynx is clear and moist.  Aphonic  Eyes: Conjunctivae and EOM are normal. Pupils are equal, round, and reactive to light.  Neck: Normal range of motion.  Widely patent stoma with clear to slightly cloudy secretions.  Neck incision clean but with saliva leaking from mid-portion of incision and draining down into the stoma.  Cardiovascular: Normal rate.   Respiratory: Effort normal.   Musculoskeletal: Normal range of motion.  Neurological: He is alert and oriented to person, place, and time. No  cranial nerve deficit.  Skin: Skin is warm and dry.  Psychiatric: He has a normal mood and affect. His behavior is normal. Judgment and thought content normal.     Assessment/Plan Pharyngocutaneous fistula following salvage laryngectomy after incomplete response to radiation treatments for laryngeal cancer. The fistula site was packed with saline gauze and drainage stopped.  A nasogastric feeding tube was placed without difficulty at the bedside.  Position in the stomach was confirmed by portable chest x-ray.  He will be admitted for IV clindamycin, dressing changes to the fistula, and strict NPO with nutrition via NG tube.  Melida Quitter, MD 02/21/2016, 9:51 PM

## 2016-02-21 NOTE — ED Notes (Signed)
Pt in from home, independent living by his daughter, per pts family the pts speech therapist called them & wanted the family to call the PCP for concern regarding possible fistula where the pts had his Larynx removed d/t CA last month, pt has had increased green secretions from the area, pts family took pics & sent to Dr. Constance Holster who told the pt to come here to have Dr. Redmond Baseman paged, A&O x4

## 2016-02-21 NOTE — ED Provider Notes (Signed)
CSN: [id number]     Arrival date & time 02/21/16  1753 History   First MD Initiated Contact with Patient 02/21/16 2017     Chief Complaint  Patient presents with  . Post-op Problem     (Consider location/radiation/quality/duration/timing/severity/associated sxs/prior Treatment) HPI Comments: Patient who is post total laryngectomy done on 6/7 by Dr Constance Holster presents today with due to a concern for a fistula of his stoma site.  He states that he noticed increased secretions of the stoma over the past week.  Today when his speech pathologist was at his home she noticed that water came from the stoma site when the patient drank some water.  She was then concerned about a fistula.  Family called Dr Constance Holster with ENT and was instructed to come to the ED.  He denies fever, chills, nausea, vomiting, SOB, or any other symptoms at this time.  The history is provided by the patient.    Past Medical History  Diagnosis Date  . MVP (mitral valve prolapse)     S/P Rt mini thoractomy for Mitral Valve repair  . Paroxysmal atrial fibrillation (HCC)     S/P Maze procedure  . Depression   . Hernia   . Aortic valve insufficiency, acquired   . Dyslipidemia   . Glaucoma   . BPH (benign prostatic hyperplasia)   . Heart valve replaced   . A-fib ([place])   . Cerebral ischemia   . Sleep disturbance   . Mild cognitive impairment   . Thoracic aortic aneurysm ([place])   . Major depression ([place])   . Hyperlipidemia   . History of echocardiogram     Echo 5/16:  Mild LVH, EF 50-55%, Gr 1 DD, septal HK, Ao sclerosis without stenosis, mild AI, MV repair ok with borderline mild MS (mean 4 mmHg), mild MR, mild LAE, mod RAE, mild TR, PASP 30 mmHg  . Hx of cardiovascular stress test     Lexiscan Myoview 5/16:  Apical thinning, EF not gated, no ischemia. Low Risk  . Coronary artery disease     50-70% LAD mitral valve repair 4+ yrs ago  . Shortness of breath dyspnea     with exertion  . HTN (hypertension)     takes Amlodipine  daily  . Arthritis   . Colon cancer [facility])     colon s/p partial colectomy  . Prostate cancer ([place])   . 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 Urinary urgency   . Urinary frequency   . Nocturia    Past Surgical History  Procedure Laterality Date  . Flexhd patch repair of chest wall hernia.  01/28/11    [name]  . Colectomy  2004    Dr [name]  . Tympanoplasty    . Mitral valve repair  12/27/2008    complex valvuloplasty with 25mm Memo 3D annuloplasty ring via right minithoracotomy  . Maze  12/27/2008    left side lesion set  . Mitral valve repair      minimally invasive  . Tee without cardioversion N/A 01/03/2013    Procedure: TRANSESOPHAGEAL ECHOCARDIOGRAM (TEE);  Surgeon: Sueanne Margarita, MD;  Location: [place];  Service: Cardiovascular;  Laterality: N/A;  . Total knee arthroplasty Right 2003  . Microlaryngoscopy Right 01/29/2015    Procedure: MICROLARYNGOSCOPY WITH BIOSPY OF RIGHT VOCAL CORD;  Surgeon: Izora Gala, MD;  Location: [place];  Service: ENT;  Laterality: Right;  . Prostate biopsy    .  Laryngetomy N/A 01/15/2016    Procedure:  TOTAL LARYNGECTOMY;  Surgeon: Izora Gala, MD;  Location: [place];  Service: ENT;  Laterality: N/A;  . Direct laryngoscopy N/A 01/15/2016    Procedure: DIRECT LARYNGOSCOPY WITH BIOPSY AND FROZEN SECTION;  Surgeon: Izora Gala, MD;  Location: [facility];  Service: ENT;  Laterality: N/A;   Family History  Problem Relation Age of Onset  . Cancer Mother     lymphoma  . Cancer Father     pancreatic  . Heart attack Neg Hx   . Stroke Neg Hx    Social History  Substance Use Topics  . Smoking status: Never Smoker   . Smokeless tobacco: Never Used  . Alcohol Use: 0.0 oz/week    0 Standard drinks or equivalent per week     Comment: Rare    Review of Systems  All other systems reviewed and are negative.     Allergies  Zoloft; Citalopram; and Trazodone and nefazodone  Home Medications   Prior to Admission  medications   Medication Sig Start Date End Date Taking? Authorizing Provider  amLODipine (NORVASC) 2.5 MG tablet TAKE 1 TABLET (2.5 MG TOTAL) BY MOUTH DAILY. FOR BLOOD PRESSURE 01/13/16   Liliane Shi, PA-C  atorvastatin (LIPITOR) 40 MG tablet TAKE 1 TABLET BY MOUTH DAILY AT 6PM FOR CHOLESTEROL 07/08/15   Tiffany L Reed, DO  Cholecalciferol (VITAMIN D) 2000 units CAPS Take one capsule by mouth once daily 11/06/15   Tiffany L Reed, DO  HYDROcodone-acetaminophen (HYCET) 7.5-325 mg/15 ml solution Take 10 mLs by mouth 4 (four) times daily as needed for moderate pain. Patient not taking: Reported on 01/27/2016 01/22/16   Izora Gala, MD  levothyroxine (SYNTHROID, LEVOTHROID) 50 MCG tablet Take 50 mcg by mouth daily. 02/13/16   Historical Provider, MD  travoprost, benzalkonium, (TRAVATAN) 0.004 % ophthalmic solution Place 1 drop into both eyes at bedtime. Reported on 01/27/2016    Historical Provider, MD  zolpidem (AMBIEN) 5 MG tablet Take 1 tablet (5 mg total) by mouth at bedtime as needed. for sleep 02/17/16   Lauree Chandler, NP   BP 160/73 mmHg  Pulse 43  Temp(Src) 98.2 F (36.8 C) (Oral)  Resp 14  Ht 6' (1.829 m)  Wt 93.895 kg  BMI 28.07 kg/m2  SpO2 96% Physical Exam  Constitutional: He appears well-developed and well-nourished.  HENT:  Head: Normocephalic and atraumatic.  Widely patent stoma with whitish colored drainage.  Mild surrounding erythema.  No surrounding edema or warmth.  Neck: Normal range of motion. Neck supple.  Cardiovascular: Normal rate, regular rhythm and normal heart sounds.   Pulmonary/Chest: Effort normal and breath sounds normal.  Neurological: He is alert.  Skin: Skin is warm and dry.  Psychiatric: He has a normal mood and affect.  Nursing note and vitals reviewed.   ED Course  Procedures (including critical care time) Labs Review Labs Reviewed  CBC - Abnormal; Notable for the following:    RBC 3.99 (*)    Hemoglobin 12.4 (*)    HCT 38.3 (*)    All other  components within normal limits  COMPREHENSIVE METABOLIC PANEL - Abnormal; Notable for the following:    Glucose, Bld 104 (*)    BUN 27 (*)    Albumin 3.4 (*)    ALT 14 (*)    GFR calc non Af Amer 51 (*)    GFR calc Af Amer 59 (*)    All other components within normal limits    Imaging  Review No results found. I have personally reviewed and evaluated these images and lab results as part of my medical decision-making.   EKG Interpretation None      MDM   Final diagnoses:  None   Patient with a history of Laryngectomy on 01/15/16 presents today due to concern for increased secretions from his stoma site and also due to concern that when he drank water earlier today the water came out the stoma site.  Concern for fistula.  Dr. Redmond Baseman with ENT consulted and saw the patient in the ED and admitted for IV antibiotics.      Hyman Bible, PA-C 02/23/16 1647  Milton Ferguson, MD 02/25/16 [phone]

## 2016-02-22 ENCOUNTER — Inpatient Hospital Stay (HOSPITAL_COMMUNITY): Payer: Medicare Other

## 2016-02-22 MED ORDER — JEVITY 1.2 CAL PO LIQD
1000.0000 mL | ORAL | Status: DC
  Filled 2016-02-22 (×6): qty 1000

## 2016-02-22 NOTE — Progress Notes (Signed)
Dr. Redmond Baseman requested bed transfer.  6N unable to accept patient at this time, per 6N charge RN.

## 2016-02-22 NOTE — Progress Notes (Signed)
RD contacted for replacement of NG tube. Unable to access esophagus through R nare and on trial of L nare, tube proceeded to exit through fistula. Notified RN who will notify MD  Burtis Junes RD, LDN, [facility] Pager: [phone] 02/22/2016 5:10 PM

## 2016-02-22 NOTE — Progress Notes (Signed)
Cortrak Tube Team contacted for re-insertion of feeding tube.

## 2016-02-22 NOTE — Progress Notes (Signed)
Patient ID: Timothy Suarez, male   DOB: January 31, 1925, 82 y.o.   MRN: [id number] Called to bedside due to feeding tube coming out.  Nursing attempted replacement by my instruction without success. Replaced feeding tube through the nose without much difficulty.  Will get portable chest x-ray to again confirm placement before resuming feedings.

## 2016-02-22 NOTE — Progress Notes (Signed)
Patient ID: Timothy Suarez, male   DOB: [birth year], 82 y.o.   MRN: [id number] Had to replace feeding tube again due to the tube curling in the esophagus.  A second x-ray is ordered.

## 2016-02-22 NOTE — Progress Notes (Signed)
AC advanced NG tube 10-15 cm as recommended via radiology.  Placed order for chest x-ray to verify placement before use of NG tube.

## 2016-02-22 NOTE — Progress Notes (Signed)
  Subjective: Doing well.  No complaints.  Objective: Vital signs in last 24 hours: Temp:  [98.2 F (36.8 C)-99.2 F (37.3 C)] 98.2 F (36.8 C) (07/15 0447) Pulse Rate:  [26-82] 65 (07/15 0447) Resp:  [14-18] 16 (07/15 0447) BP: (115-160)/(60-82) 137/60 mmHg (07/15 0447) SpO2:  [95 %-99 %] 96 % (07/15 0447) Weight:  [84.959 kg (187 lb 4.8 oz)-93.895 kg (207 lb)] 84.959 kg (187 lb 4.8 oz) (07/14 2335)    Intake/Output from previous day:   Intake/Output this shift:    General appearance: alert, cooperative and no distress Neck: changed dressing, fistula remains small, stoma with mild crusting removed  Lab Results:   Recent Labs  02/21/16 1823  WBC 8.3  HGB 12.4*  HCT 38.3*  PLT 203   BMET  Recent Labs  02/21/16 1823  NA 140  K 3.7  CL 111  CO2 24  GLUCOSE 104*  BUN 27*  CREATININE 1.22  CALCIUM 9.0   PT/INR No results for input(s): LABPROT, INR in the last 72 hours. ABG No results for input(s): PHART, HCO3 in the last 72 hours.  Invalid input(s): PCO2, PO2  Studies/Results: Dg Chest Portable 1 View  02/21/2016  CLINICAL DATA:  Tube placement. EXAM: PORTABLE CHEST 1 VIEW COMPARISON:  01/15/2016 FINDINGS: Portable AP chest at 2148 hours shows a small bore catheter extending down over the mediastinum with the tip apparently positioned in the gastric fundus. The cardio pericardial silhouette is enlarged. Basilar chronic atelectasis or scarring noted. IMPRESSION: Apparent feeding tube placement. Although the entire course of the catheter has not been included on the film, the radiopaque tip does project over the expected location of the gastric fundus. Electronically Signed   By: Misty Stanley M.D.   On: 02/21/2016 21:57    Anti-infectives: Anti-infectives    Start     Dose/Rate Route Frequency Ordered Stop   02/22/16 0000  clindamycin (CLEOCIN) IVPB 600 mg     600 mg 100 mL/hr over 30 Minutes Intravenous Every 8 hours 02/21/16 2340         Assessment/Plan: Pharyngocutaneous fistula s/p salvage laryngectomy Continue dressing changes to see if healing can be promoted.  NPO with tube feedings.  Dietician consult pending.  Will plan to change to bolus feeds.  OOB.  LOS: 1 day    Timothy Suarez 02/22/2016

## 2016-02-22 NOTE — Progress Notes (Signed)
Patient has been refusing to stay in his room.  He has a Air cabin crew and is swatting at and hitting the sitter and wanting to walk the halls without assistance.  Gave Morphine iv 2 mg at 2117, patient sat down for about an hour but he is back active and wanting staff to leave the room.

## 2016-02-22 NOTE — Progress Notes (Signed)
Initial Nutrition Assessment  DOCUMENTATION CODES:  Pt meets criteria for severe MALNUTRITION in the context of chronic illness as evidenced by  Wt loss of 10% x 30 d and energy intake <75% for > 1 month.     INTERVENTION:  Start tube feeding conservatively given patients hypo caloric intake: Jevity 1.2 @ 30 ml/hr via NG and increase by 10 ml every 8 hours to goal rate of 75 ml/hr.    Tube feeding regimen provides 2160 kcal (100% of needs), 100 grams of protein, and 1452 of H2O.    NUTRITION DIAGNOSIS:   Inadequate oral intake related to inability to eat as evidenced by NPO status.   GOAL:   Patient will meet greater than or equal to 90% of their needs   MONITOR:   TF tolerance, Weight trends, Labs  REASON FOR ASSESSMENT:   Consult Enteral/tube feeding initiation and management  ASSESSMENT:  Pleasant 82 year old male underwent salvage laryngectomy 01/15/16. He seemed to be doing well and was swallowing a regular diet. He has noticed some worsening of secretions in the airway over the past week. Today, his speech pathologist observed water leaking from the incision after swallowing and he was instructed to come to the ER for admission.  He has experienced severe weight loss of 10% in the past 30 days. Patient reports decreased oral intake but is unable to provided detailed diet hx due to his limited communication ability. He uses the communication board to tell me that lives at [facility] and they provide 1 meal daily. His daughter is nearby and helps some with his food preparation. No family are present.   There were orders for his tube feeding to start today but according to his nurse he pulled it out and a consult has been put in to place a new one.   Recent Labs Lab 02/21/16 1823  NA 140  K 3.7  CL 111  CO2 24  BUN 27*  CREATININE 1.22  CALCIUM 9.0  GLUCOSE 104*   Medications and labs reviewed  NFPE: moderate muscle loss (clavicles and dorsal), fat mass  depletion arms . No edema.  Diet Order:  Diet NPO time specified  Skin:  Reviewed, no issues  Last BM:  prior to admission  Height:   Ht Readings from Last 1 Encounters:  02/21/16 6' (1.829 m)    Weight:   Wt Readings from Last 1 Encounters:  02/21/16 187 lb 4.8 oz (84.959 kg)    Ideal Body Weight:  81 kg  BMI:  Body mass index is 25.4 kg/(m^2).  Estimated Nutritional Needs:   Kcal:  2125  Protein:  102-110 gr  Fluid:  2.1 liters daily  EDUCATION NEEDS:   No education needs identified at this time   Colman Cater MS,RD,CSG,LDN Office: [phone] Pager: [phone]

## 2016-02-23 ENCOUNTER — Inpatient Hospital Stay (HOSPITAL_COMMUNITY): Payer: Medicare Other

## 2016-02-23 NOTE — Progress Notes (Signed)
MD on call notified.  MD will replace NG tube in the morning.

## 2016-02-23 NOTE — Progress Notes (Signed)
Feeding tube advanced, repeat x-ray ordered.  Results given to Dr. Redmond Baseman.  Patient to transfer to 6N18; per MD request. Family made aware.

## 2016-02-23 NOTE — Progress Notes (Signed)
Patient ID: Timothy Suarez, male   DOB: [birth year], 82 y.o.   MRN: [id number] Replaced feeding tube.  X-ray ordered.

## 2016-02-23 NOTE — Progress Notes (Signed)
Patient has pulled out is NG tube again.  Will notify on-call doctor.

## 2016-02-23 NOTE — Progress Notes (Signed)
Subjective: His feeding tube came out again overnight.  This morning, he has pulled out his IV, has no packing in his wound, and is dressed to leave.  He has no specific complaints but asks to see a different doctor.  Objective: Vital signs in last 24 hours: Temp:  [98.1 F (36.7 C)-98.8 F (37.1 C)] 98.2 F (36.8 C) (07/16 0349) Pulse Rate:  [67-89] 89 (07/16 0349) Resp:  [16-18] 18 (07/16 0349) BP: (130-153)/(56-68) 133/68 mmHg (07/16 0349) SpO2:  [98 %-100 %] 100 % (07/16 0349) Last BM Date:  (PTA)  Intake/Output from previous day:   Intake/Output this shift:    General appearance: alert, cooperative and no distress Neck: packing replaced in the fistula, stoma widely patent  Lab Results:   Recent Labs  02/21/16 1823  WBC 8.3  HGB 12.4*  HCT 38.3*  PLT 203   BMET  Recent Labs  02/21/16 1823  NA 140  K 3.7  CL 111  CO2 24  GLUCOSE 104*  BUN 27*  CREATININE 1.22  CALCIUM 9.0   PT/INR No results for input(s): LABPROT, INR in the last 72 hours. ABG No results for input(s): PHART, HCO3 in the last 72 hours.  Invalid input(s): PCO2, PO2  Studies/Results: Dg Chest Portable 1 View  02/21/2016  CLINICAL DATA:  Tube placement. EXAM: PORTABLE CHEST 1 VIEW COMPARISON:  01/15/2016 FINDINGS: Portable AP chest at 2148 hours shows a small bore catheter extending down over the mediastinum with the tip apparently positioned in the gastric fundus. The cardio pericardial silhouette is enlarged. Basilar chronic atelectasis or scarring noted. IMPRESSION: Apparent feeding tube placement. Although the entire course of the catheter has not been included on the film, the radiopaque tip does project over the expected location of the gastric fundus. Electronically Signed   By: Misty Stanley M.D.   On: 02/21/2016 21:57   Dg Abd Portable 1v  02/22/2016  CLINICAL DATA:  NG tube placement EXAM: PORTABLE ABDOMEN - 1 VIEW COMPARISON:  02/22/2016 FINDINGS: Enteric tube placed with tip  in the right upper quadrant consistent with location in the proximal duodenum. Scattered gas and stool throughout the colon. No small or large distention. Aortic calcifications. Degenerative changes in spine. IMPRESSION: Enteric tube tip is in the right abdomen consistent with location of the proximal duodenum. Aortic atherosclerosis. Electronically Signed   By: Lucienne Capers M.D.   On: 02/22/2016 23:12   Dg Abd Portable 1v  02/22/2016  CLINICAL DATA:  82 year old male verifying enteric tube placement. Initial encounter. EXAM: PORTABLE ABDOMEN - 1 VIEW COMPARISON:  1847 hours today.  CT Abdomen and Pelvis 01/16/2015. FINDINGS: Portable AP supine view at 1941 hours. Enteric tube tip appears to be at the level of the gastric fundus, and the tube is no longer looped in the distal esophagus. The tube could be advanced about 10 more cm to allow for the tip to reached the level of the distal stomach. Stable bowel gas pattern. Stable visible lung bases. Calcified aortic atherosclerosis. Levoconvex lumbar scoliosis with degenerative changes. Stable visualized osseous structures. IMPRESSION: 1. Improved enteric tube placement, tip suspected at the GE junction or just inside the stomach. The tube is no longer looped in the distal esophagus. Advance 10-15 cm to allow for the tip to reach the distal stomach or proximal duodenum. And a repeat film could be obtained at that time. 2. Bowel gas pattern remains negative.  Stable visible lung bases. Electronically Signed   By: Genevie Ann M.D.   On:  02/22/2016 20:02   Dg Abd Portable 1v  02/22/2016  CLINICAL DATA:  82 year old male with feeding tube placement. EXAM: PORTABLE ABDOMEN - 1 VIEW COMPARISON:  Abdominal CT dated 01/16/2015 FINDINGS: A feeding tube is partially visualized. The lower portion of the tube appears cord over the left cardiac silhouette. The positioning of the tube is not well evaluated and this single provided view but the tip appears to be at the  gastroesophageal junction. The superior portion of the posterior appears to extend over the mediastinal silhouette. This tube may be coiled in hiatal hernia. However no hiatal hernia is seen on the CT dated 01/16/2015. A partially visualized tube is noted extending from the upper edge of the image into the right hilar region. It is indeterminate whether this is part of the enteric tube or related to a secondary support device. Repeat radiograph with inclusion of the chest and abdomen and better positioning of the patient is recommended for better evaluation. There is no bowel dilatation to no free air. There is extensive degenerative changes of the spine. No acute fractures. Atherosclerotic calcification of the aorta. IMPRESSION: Feeding tube coiled over the left cardiac border with tip likely at the gastroesophageal junction. Repeat radiograph with better positioning of the patient and inclusion of the chest and abdomen recommended. Electronically Signed   By: Anner Crete M.D.   On: 02/22/2016 19:02    Anti-infectives: Anti-infectives    Start     Dose/Rate Route Frequency Ordered Stop   02/22/16 0000  clindamycin (CLEOCIN) IVPB 600 mg     600 mg 100 mL/hr over 30 Minutes Intravenous Every 8 hours 02/21/16 2340        Assessment/Plan: Pharyngocutaneous fistula s/p salvage laryngectomy, delerium I discussed the situation with his daughter, Jackelyn Poling, who says she is coming to stay with him.  I tried to replace his feeding tube without the wire and it would not pass.  I have ordered up a new feeding tube with wire to try to place.  He needs to remain NPO with nutrition given into the stomach in order to give the fistula a chance to heal.  He was assured that his doctor, Dr. Constance Holster, will be back to take over his care tomorrow.  LOS: 2 days    Tanae Petrosky 02/23/2016

## 2016-02-24 ENCOUNTER — Inpatient Hospital Stay (HOSPITAL_COMMUNITY): Payer: Medicare Other

## 2016-02-24 MED ORDER — JEVITY 1.2 CAL PO LIQD
237.0000 mL | Freq: Every day | ORAL | Status: DC
  Administered 2016-02-24 – 2016-02-25 (×5): 237 mL

## 2016-02-24 NOTE — Evaluation (Signed)
Physical Therapy Evaluation Patient Details Name: Timothy Suarez MRN: [id number] DOB: 1925/03/26 Today's Date: 02/24/2016   History of Present Illness  Patient is a 82 y/o male with hx of laryngectomy, A-fib, HTN, HLD, colon ca, CAD, major depression, Thoracic aortic aneurysm presents with Pharyngocutaneous fistula s/p salvage laryngectomy. Now with NG tube.  Clinical Impression  Patient presents with generalized weakness, deconditioning, decreased endurance and balance deficits s/p above impacting mobility. Pt from [facility] facility and uses RW for ambulation at baseline. Pt uses white board to communicate. Tolerated gait training with Min A for balance/safety. Will need to see if facility can provide additional support at discharge. Will follow acutely to maximize independence and mobility prior to return home.    Follow Up Recommendations Home health PT;Supervision for mobility/OOB    Equipment Recommendations  None recommended by PT    Recommendations for Other Services OT consult     Precautions / Restrictions Precautions Precautions: Fall Restrictions Weight Bearing Restrictions: No      Mobility  Bed Mobility Overal bed mobility: Needs Assistance Bed Mobility: Supine to Sit     Supine to sit: Supervision;HOB elevated     General bed mobility comments: Reports dizziness getting to EOB. No assist needed. Use of rails.  Transfers Overall transfer level: Needs assistance Equipment used: Rolling walker (2 wheeled) Transfers: Sit to/from Stand Sit to Stand: Min assist         General transfer comment: Min A to boost from EOB with cues for hand placement/technique. transferred to chair post ambulation bout.  Ambulation/Gait Ambulation/Gait assistance: Min assist Ambulation Distance (Feet): 150 Feet Assistive device: Rolling walker (2 wheeled) Gait Pattern/deviations: Step-through pattern;Decreased stride length;Trunk flexed Gait  velocity: decreased   General Gait Details: Slow, mostly steady gait with mild knee instability noted and DOE.   Stairs            Wheelchair Mobility    Modified Rankin (Stroke Patients Only)       Balance Overall balance assessment: Needs assistance Sitting-balance support: Feet supported;No upper extremity supported Sitting balance-Leahy Scale: Good     Standing balance support: During functional activity Standing balance-Leahy Scale: Poor Standing balance comment: Reliant on BUEs for support.                             Pertinent Vitals/Pain Pain Assessment: No/denies pain    Home Living Family/patient expects to be discharged to:: Other (Comment) ([facility])                 Additional Comments: from [place] [facility]    Prior Function Level of Independence: Independent with assistive device(s)         Comments: Uses RW for ambulation. Walks to dining hall which is 4 doors down from his room.     Hand Dominance        Extremity/Trunk Assessment   Upper Extremity Assessment: Defer to OT evaluation           Lower Extremity Assessment: Generalized weakness         Communication   Communication: Other (comment);HOH (communicated with white board)  Cognition Arousal/Alertness: Awake/alert Behavior During Therapy: WFL for tasks assessed/performed Overall Cognitive Status: Difficult to assess                      General Comments General comments (skin integrity, edema, etc.): Daughter present in room at beginning  of session.    Exercises        Assessment/Plan    PT Assessment Patient needs continued PT services  PT Diagnosis Difficulty walking;Generalized weakness   PT Problem List Decreased strength;Cardiopulmonary status limiting activity;Decreased balance;Decreased mobility;Decreased activity tolerance;Decreased cognition  PT Treatment Interventions Balance training;Gait  training;Functional mobility training;Therapeutic activities;Therapeutic exercise;Patient/family education   PT Goals (Current goals can be found in the Care Plan section) Acute Rehab PT Goals Patient Stated Goal: to have a steak and beer PT Goal Formulation: With patient Time For Goal Achievement: 03/09/16 Potential to Achieve Goals: Fair    Frequency Min 3X/week   Barriers to discharge Decreased caregiver support not sure level of assist [facility] can provide    Co-evaluation               End of Session Equipment Utilized During Treatment: Gait belt Activity Tolerance: Patient tolerated treatment well Patient left: in chair;with call bell/phone within reach Nurse Communication: Mobility status         Time: [id number] PT Time Calculation (min) (ACUTE ONLY): 27 min   Charges:   PT Evaluation $PT Eval Moderate Complexity: 1 Procedure PT Treatments $Gait Training: 8-22 mins   PT G Codes:        Dyneshia Baccam A Filbert Craze 02/24/2016, 5:12 PM Wray Kearns, [place], DPT [phone]

## 2016-02-24 NOTE — Care Management Important Message (Signed)
Important Message  Patient Details  Name: Timothy Suarez MRN: [id number] Date of Birth: December 07, 1924   Medicare Important Message Given:  Yes    Loann Quill 02/24/2016, 3:12 PM

## 2016-02-24 NOTE — Significant Event (Signed)
Consult for cortrak made to team for feeding tube advancement. Upon assessment, the small bore tube that patient has is not a cortrak tube-therefore, unable to advance/adjust using contrak machine. This RN attempted to advance tube. Did not felt resistance when advance, however, tube is coiling in his mouth, unable to advance at all due to coiling. Left tube as it in same marking as previous. Made aware of this to primary RN who contacted MD.

## 2016-02-24 NOTE — Progress Notes (Signed)
Patient ID: Timothy Suarez, male   DOB: 1925-05-09, 82 y.o.   MRN: [id number] He is awake and alert. There is no dressing in place. I had cleaned his neck and stoma and packed the wound with a small piece of Kerlix and placed a dry dressing. Continuous tube feeds and wound care. Continue on antibiotics. His daughter is at the bedside. We will get him up walking today.

## 2016-02-24 NOTE — Progress Notes (Signed)
Cortrak team was called to see if they could advance feeding tube. They could not. Dr. Constance Holster notified.

## 2016-02-24 NOTE — Progress Notes (Signed)
Patient ID: Timothy Suarez, male   DOB: 06-24-1925, 82 y.o.   MRN: [id number] NG feeding tube was withdrawn and replaced, without any further coiling in the pharynx. Will check ray and then start feeding.

## 2016-02-24 NOTE — Progress Notes (Signed)
Spoke with Dr. Constance Holster about feeding tube placement. Per Dr. Constance Holster, pt. Is fine to have Jevity feedings.

## 2016-02-25 MED ORDER — FREE WATER
250.0000 mL | Freq: Four times a day (QID) | Status: DC
  Administered 2016-02-25 – 2016-03-03 (×17): 250 mL

## 2016-02-25 MED ORDER — JEVITY 1.2 CAL PO LIQD
237.0000 mL | ORAL | Status: DC
  Administered 2016-02-25 – 2016-02-26 (×2): 237 mL
  Filled 2016-02-25: qty 237

## 2016-02-25 MED ORDER — PRO-STAT SUGAR FREE PO LIQD
30.0000 mL | Freq: Every day | ORAL | Status: DC
  Administered 2016-02-25 – 2016-03-03 (×6): 30 mL via ORAL
  Filled 2016-02-25 (×6): qty 30

## 2016-02-25 NOTE — Progress Notes (Signed)
Physical Therapy Treatment Patient Details Name: Timothy Suarez MRN: [id number] DOB: 28-Aug-1924 Today's Date: 02/25/2016    History of Present Illness Patient is a 82 y/o male with hx of laryngectomy, A-fib, HTN, HLD, colon ca, CAD, major depression, Thoracic aortic aneurysm presents with Pharyngocutaneous fistula s/p salvage laryngectomy. Now with NG tube.    PT Comments    Pt progressing well with PT session.  Improved endurance noted during session.  Educated staff to continue to ambulate with therapy.  Pt has difficulty communicating and uses white board in room with increased time.  Informed nurse of needs for SLP order to assess communication to improve methods for improve quality of life.    Follow Up Recommendations  Home health PT;Supervision for mobility/OOB     Equipment Recommendations  None recommended by PT    Recommendations for Other Services Speech consult (for communication board?)     Precautions / Restrictions Precautions Precautions: Fall Restrictions Weight Bearing Restrictions: No    Mobility  Bed Mobility Overal bed mobility: Modified Independent Bed Mobility: Supine to Sit     Supine to sit: Modified independent (Device/Increase time)     General bed mobility comments: Pt performed with good technique.  pt required management of lines and leads for safety.    Transfers Overall transfer level: Needs assistance Equipment used: Rolling walker (2 wheeled) Transfers: Sit to/from Stand Sit to Stand: Supervision         General transfer comment: Pt required increased time but performed transfer without assistance.    Ambulation/Gait Ambulation/Gait assistance: Min guard Ambulation Distance (Feet): 650 Feet Assistive device: Rolling walker (2 wheeled) Gait Pattern/deviations: Step-through pattern;Decreased stride length;Trunk flexed Gait velocity: decreased   General Gait Details: Slow, mostly steady gait with mild knee instability noted  and DOE. Cues for postural awareness and increasing stride length.  Pt performed negotiation of ramp during session with steady incline.     Stairs            Wheelchair Mobility    Modified Rankin (Stroke Patients Only)       Balance Overall balance assessment: Needs assistance   Sitting balance-Leahy Scale: Good       Standing balance-Leahy Scale: Fair                      Cognition Arousal/Alertness: Awake/alert Behavior During Therapy: WFL for tasks assessed/performed Overall Cognitive Status: Difficult to assess                      Exercises      General Comments        Pertinent Vitals/Pain Pain Assessment: No/denies pain    Home Living                      Prior Function            PT Goals (current goals can now be found in the care plan section) Acute Rehab PT Goals Patient Stated Goal: to drink a beer.  Potential to Achieve Goals: Fair Progress towards PT goals: Progressing toward goals    Frequency  Min 3X/week    PT Plan      Co-evaluation             End of Session Equipment Utilized During Treatment: Gait belt Activity Tolerance: Patient tolerated treatment well Patient left: in chair;with call bell/phone within reach     Time: 1344-1413 PT Time Calculation (min) (ACUTE ONLY):  29 min  Charges:  $Gait Training: 23-37 mins                    G Codes:      Cristela Blue March 15, 2016, 3:19 PM  Governor Rook[place], PTA pager [phone]

## 2016-02-25 NOTE — Progress Notes (Signed)
Patient ID: Timothy Suarez, male   DOB: 01-Oct-1924, 82 y.o.   MRN: [id number] No complaints, tolerating tube feeds.  Stoma much cleaner. Packing and dressing changed, minimal leakage from wound.  No swelling or erythema.  Stable, continue tube feeds, ambulating, wound care and Abx.

## 2016-02-25 NOTE — Progress Notes (Signed)
Nutrition Follow-up  DOCUMENTATION CODES:   Severe malnutrition in context of chronic illness  INTERVENTION:   Jevity 1.2 @ 30 ml/hr increase by 10 ml every 8 hours to goal rate of 75 ml/hr 30 ml Prostat daily Provides: 2260 kcal, 115 grams protein, and 1452 ml H2O.    NUTRITION DIAGNOSIS:   Inadequate oral intake related to inability to eat as evidenced by NPO status. Ongoing.   GOAL:   Patient will meet greater than or equal to 90% of their needs Met.   MONITOR:   TF tolerance, Weight trends, Labs  REASON FOR ASSESSMENT:   Consult Enteral/tube feeding initiation and management  ASSESSMENT:   82 year old male underwent salvage laryngectomy 01/15/16. He seemed to be doing well and was swallowing a regular diet. He has noticed some worsening of secretions in the airway over the past week. Today, his speech pathologist observed water leaking from the incision after swallowing and he was instructed to come to the ER for admission.  Pharyngocutaneous fistula following salvage laryngectomy after incomplete response to radiation treatments for laryngeal cancer.  Medications reviewed.  Labs reviewed.  Small bore feeding inserted by ENT, tip at GE junction despite being readjusted by MD. Feedings started 7/17.  Spoke with RN.   From 7/15 note: Pt meets criteria for severe MALNUTRITION in the context of chronic illness as evidenced by Wt loss of 10% x 30 d and energy intake <75% for > 1 month.  Diet Order:  Diet NPO time specified  Skin:  Reviewed, no issues  Last BM:  7/15  Height:   Ht Readings from Last 1 Encounters:  02/21/16 6' (1.829 m)    Weight:   Wt Readings from Last 1 Encounters:  02/21/16 187 lb 4.8 oz (84.959 kg)    Ideal Body Weight:  81 kg  BMI:  Body mass index is 25.4 kg/(m^2).  Estimated Nutritional Needs:   Kcal:  2125  Protein:  102-110 gr  Fluid:  2.1 liters daily  EDUCATION NEEDS:   No education needs identified at this  time  [place], [place], [place] Pager [phone] After Hours Pager

## 2016-02-26 ENCOUNTER — Ambulatory Visit: Payer: Medicare Other | Admitting: Rehabilitative and Restorative Service Providers"

## 2016-02-26 DIAGNOSIS — E43 Unspecified severe protein-calorie malnutrition: Secondary | ICD-10-CM | POA: Insufficient documentation

## 2016-02-26 MED ORDER — JEVITY 1.2 CAL PO LIQD
1000.0000 mL | ORAL | Status: DC
  Administered 2016-02-26 – 2016-03-02 (×6): 1000 mL
  Filled 2016-02-26 (×13): qty 1000

## 2016-02-26 NOTE — Progress Notes (Signed)
Patient ID: Timothy Suarez, male   DOB: 08-Dec-1924, 82 y.o.   MRN: [id number] Doing well, no new complaints. He is awake and alert, working with speech pathology at the bedside. The stoma looks excellent. The packing was changed in the wound. There is no signs of infection. There is clear mucous secretions. Continue wound care. Continue tube feeds.

## 2016-02-26 NOTE — Evaluation (Signed)
Speech Language Pathology Evaluation Patient Details Name: Timothy Suarez MRN: [id number] DOB: 08-Mar-1925 Today's Date: 02/26/2016 Time: [id number] SLP Time Calculation (min) (ACUTE ONLY): 34 min  Problem List:  Patient Active Problem List   Diagnosis Date Noted  . Protein-calorie malnutrition, severe 02/26/2016  . Pharyngocutaneous fistula 02/21/2016  . S/P laryngectomy 01/15/2016  . S/P biopsy 01/15/2016  . Vascular dementia with depressed mood 12/09/2015  . Baker's cyst of knee 12/09/2015  . Other fatigue 12/09/2015  . 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 Prostate cancer ([place]) 01/10/2015  . S/P MVR (mitral valve repair) 12/20/2014  . S/P Maze operation for atrial fibrillation 12/20/2014  . Obesity (BMI 30-39.9) 04/02/2014  . Prolonged Q-T interval on ECG 04/02/2014  . Mild cognitive impairment 04/02/2014  . Major depressive disorder, recurrent episode, mild ([place]) 02/08/2014  . Insomnia 02/08/2014  . Dizziness and giddiness 02/08/2014  . Generalized muscle weakness 02/08/2014  . Cerumen impaction 02/08/2014  . Glaucoma 02/08/2014  . Colon cancer ([place])   . Coronary artery disease   . Moderate aortic insufficiency 01/03/2013  . Mild mitral regurgitation 01/03/2013  . MVP (mitral valve prolapse)   . Paroxysmal atrial fibrillation (HCC)   . HTN (hypertension)   . Depression   . chest wall hernia (lung hernia)    Past Medical History:  Past Medical History  Diagnosis Date  . MVP (mitral valve prolapse)     S/P Rt mini thoractomy for Mitral Valve repair  . Paroxysmal atrial fibrillation (HCC)     S/P Maze procedure  . Depression   . Hernia   . Aortic valve insufficiency, acquired   . Dyslipidemia   . Glaucoma   . BPH (benign prostatic hyperplasia)   . Heart valve replaced   . A-fib ([place])   . Cerebral ischemia   . Sleep disturbance   . Mild cognitive impairment   . Thoracic aortic aneurysm ([place])   . Major depression ([place])    . Hyperlipidemia   . History of echocardiogram     Echo 5/16:  Mild LVH, EF 50-55%, Gr 1 DD, septal HK, Ao sclerosis without stenosis, mild AI, MV repair ok with borderline mild MS (mean 4 mmHg), mild MR, mild LAE, mod RAE, mild TR, PASP 30 mmHg  . Hx of cardiovascular stress test     Lexiscan Myoview 5/16:  Apical thinning, EF not gated, no ischemia. Low Risk  . Coronary artery disease     50-70% LAD mitral valve repair 4+ yrs ago  . Shortness of breath dyspnea     with exertion  . HTN (hypertension)     takes Amlodipine daily  . Arthritis   . Colon cancer [facility])     colon s/p partial colectomy  . Prostate cancer ([place])   . Cancer ([place])     invasive squamous cell carcinoma of right vocal cord  . Urinary urgency   . Urinary frequency   . Nocturia    Past Surgical History:  Past Surgical History  Procedure Laterality Date  . Flexhd patch repair of chest wall hernia.  01/28/11    [name]  . Colectomy  2004    Dr [name]  . Tympanoplasty    . Mitral valve repair  12/27/2008    complex valvuloplasty with 25mm Memo 3D annuloplasty ring via right minithoracotomy  . Maze  12/27/2008    left side lesion set  . Mitral valve repair      minimally invasive  .  Tee without cardioversion N/A 01/03/2013    Procedure: TRANSESOPHAGEAL ECHOCARDIOGRAM (TEE);  Surgeon: Sueanne Margarita, MD;  Location: [place];  Service: Cardiovascular;  Laterality: N/A;  . Total knee arthroplasty Right 2003  . Microlaryngoscopy Right 01/29/2015    Procedure: MICROLARYNGOSCOPY WITH BIOSPY OF RIGHT VOCAL CORD;  Surgeon: Izora Gala, MD;  Location: [place];  Service: ENT;  Laterality: Right;  . Prostate biopsy    . Laryngetomy N/A 01/15/2016    Procedure:  TOTAL LARYNGECTOMY;  Surgeon: Izora Gala, MD;  Location: [place];  Service: ENT;  Laterality: N/A;  . Direct laryngoscopy N/A 01/15/2016    Procedure: DIRECT LARYNGOSCOPY WITH BIOPSY AND FROZEN SECTION;  Surgeon: Izora Gala, MD;  Location: [place];  Service: ENT;   Laterality: N/A;   HPI:  82 year old male underwent salvage laryngectomy 01/15/16. He seemed to be doing well and was swallowing a regular diet. He noticed some worsening of secretions in the airway over the past week. His speech pathologist observed water leaking from the incision after swallowing and he was instructed to come to the ER for admission. Pt has been made NPO while wound heals and has an NG tube. Pt will have a TEP eventually per Dr. Constance Holster. He has been working with a home health SLP and reports he is learning to use an IPAD with an app to type and select words.    Assessment / Plan / Recommendation Clinical Impression  SLP asked to assess pt to facilite alternative communication strategies in the acute care setting. Pt is awake and alert, cognitively in tact and pleasant. He is hard of hearing and requires hearing aids and glasses. His primary method of communication is a white board, though he reports he has an Ipad at home that he is learning to use with his family and Home Health SLP. It seems this app uses some typing and word icons, but does not use text to speech.  Communication breakdowns in auditory comprehension occurred when speaker does not increase volume, make eye contact with Mr. Langlais, does not give adequate time for him to respond or asks overly complex questions. Expressive breakdown occurs when Mr. Jill Side feels rushed to write, does not write big enough or forgets or changes what to say mid sentence. SLP gave moderate verbal cues to pause and plan what he wants to say and to increase size of print. At end of session pt and SLP were able to engage in 5 minute communication exchange with the whiteboard regarding pts family and his story about SLP identifying his fistula. SLP provided written instructions to caregivers and therapists to facilitate communication and placed in room. Pt agreed with instructions and SLP discussed with RN. Will follow to further facilitate  communication. Pt to f/u with Home Health SLP at d/c.     SLP Assessment  Patient needs continued Speech Lanaguage Pathology Services    Follow Up Recommendations  Home health SLP    Frequency and Duration min 2x/week  2 weeks      SLP Evaluation Prior Functioning  Cognitive/Linguistic Baseline: Baseline deficits Baseline deficit details: s/p laryngectomy, learning to use alternative communication Available Help at Discharge: Family;Available 24 hours/day   Cognition  Overall Cognitive Status: Within Functional Limits for tasks assessed Arousal/Alertness: Awake/alert Orientation Level: Oriented X4 Attention: Sustained;Selective;Alternating Sustained Attention: Appears intact Selective Attention: Appears intact Alternating Attention: Appears intact Memory: Appears intact Awareness: Appears intact Problem Solving: Appears intact Behaviors: Impulsive (needs reminders for safety awareness with mobility) Safety/Judgment:  Impaired    Comprehension  Auditory Comprehension Overall Auditory Comprehension: Appears within functional limits for tasks assessed Reading Comprehension Reading Status: Within funtional limits    Expression Expression Primary Mode of Expression: Nonverbal - written Verbal Expression Overall Verbal Expression: Impaired at baseline (laryngectomee, language WNL with writing) Non-Verbal Means of Communication: Writing;Gestures Written Expression Dominant Hand: Right Written Expression: Exceptions to [facility] Interfering Components: Thought organization;Legibility Effective Techniques: Effective monitoring and self-correction;Verbal/Language cues (verbal cues for pacing, extra time, larger print)   Oral / Motor  Motor Speech Overall Motor Speech: Impaired at baseline (laryngectomee)   GO                   Herbie Baltimore, MA CCC-SLP [phone]  Lynann Beaver 02/26/2016, 10:10 AM

## 2016-02-26 NOTE — Care Management Important Message (Signed)
Important Message  Patient Details  Name: Timothy Suarez MRN: [id number] Date of Birth: 07/07/1925   Medicare Important Message Given:  Yes    Loann Quill 02/26/2016, 10:14 AM

## 2016-02-27 ENCOUNTER — Inpatient Hospital Stay (HOSPITAL_COMMUNITY): Payer: Medicare Other

## 2016-02-27 ENCOUNTER — Encounter: Payer: Medicare Other | Admitting: Speech Pathology

## 2016-02-27 ENCOUNTER — Encounter (HOSPITAL_COMMUNITY): Payer: Self-pay | Admitting: General Practice

## 2016-02-27 ENCOUNTER — Ambulatory Visit: Payer: Medicare Other | Admitting: Rehabilitative and Restorative Service Providers"

## 2016-02-27 LAB — BASIC METABOLIC PANEL
ANION GAP: 4 — AB (ref 5–15)
BUN: 22 mg/dL — ABNORMAL HIGH (ref 6–20)
CALCIUM: 8.8 mg/dL — AB (ref 8.9–10.3)
CO2: 26 mmol/L (ref 22–32)
CREATININE: 0.98 mg/dL (ref 0.61–1.24)
Chloride: 109 mmol/L (ref 101–111)
Glucose, Bld: 102 mg/dL — ABNORMAL HIGH (ref 65–99)
Potassium: 4 mmol/L (ref 3.5–5.1)
Sodium: 139 mmol/L (ref 135–145)

## 2016-02-27 LAB — CBC
HEMATOCRIT: 39.6 % (ref 39.0–52.0)
HEMOGLOBIN: 12.9 g/dL — AB (ref 13.0–17.0)
MCH: 30.9 pg (ref 26.0–34.0)
MCHC: 32.6 g/dL (ref 30.0–36.0)
MCV: 94.7 fL (ref 78.0–100.0)
Platelets: 206 10*3/uL (ref 150–400)
RBC: 4.18 MIL/uL — AB (ref 4.22–5.81)
RDW: 13.9 % (ref 11.5–15.5)
WBC: 7.6 10*3/uL (ref 4.0–10.5)

## 2016-02-27 LAB — PROTIME-INR
INR: 1.07 (ref 0.00–1.49)
Prothrombin Time: 14.1 seconds (ref 11.6–15.2)

## 2016-02-27 LAB — APTT: APTT: 33 s (ref 24–37)

## 2016-02-27 MED ORDER — KCL IN DEXTROSE-NACL 20-5-0.45 MEQ/L-%-% IV SOLN
INTRAVENOUS | Status: DC
  Administered 2016-02-27 – 2016-02-29 (×4): via INTRAVENOUS
  Filled 2016-02-27 (×4): qty 1000

## 2016-02-27 NOTE — Progress Notes (Signed)
Called pt's daughter and gave her an update on gastrostomy tube placement plans

## 2016-02-27 NOTE — Progress Notes (Signed)
Went inside the room when bed alarm went off and found pt trying to get OOB, panda tube and IV was out. Dr. Wilburn Cornelia made aware, said to wait for Md in the morning when he do his rounds.

## 2016-02-27 NOTE — Progress Notes (Signed)
Physical Therapy Treatment Patient Details Name: Timothy Suarez MRN: [id number] DOB: Nov 23, 1924 Today's Date: 02/27/2016    History of Present Illness Patient is a 82 y/o male with hx of laryngectomy, A-fib, HTN, HLD, colon ca, CAD, major depression, Thoracic aortic aneurysm presents with Pharyngocutaneous fistula s/p salvage laryngectomy. Now with NG tube.    PT Comments    Pt performed gait training, standing exercises and standing balance activities.  Pt more fatigued this session.  Pt remains motivated.  Pt for possible PEG placement tomorrow pending results of CT of abdomen.  Will continue to follow patient during acute hospitalization.   Follow Up Recommendations  Home health PT;Supervision for mobility/OOB     Equipment Recommendations  None recommended by PT    Recommendations for Other Services       Precautions / Restrictions Precautions Precautions: Fall Restrictions Weight Bearing Restrictions: No    Mobility  Bed Mobility               General bed mobility comments: Pt receieved in recliner on arrival.    Transfers Overall transfer level: Needs assistance Equipment used: Rolling walker (2 wheeled) Transfers: Sit to/from Stand Sit to Stand: Supervision         General transfer comment: Pt required cues for safety to maintain seated until lines and leads are managed.  pt required cues for hand placement.  Performed transfer from recliner x2 and commode with L grab bar x1.    Ambulation/Gait Ambulation/Gait assistance: Min guard Ambulation Distance (Feet): 650 Feet Assistive device: Rolling walker (2 wheeled) Gait Pattern/deviations: Step-through pattern;Trunk flexed Gait velocity: decreased   General Gait Details: mildly unsteady but not true LOB.  Pt required cues for safety secondary to impulsivity.  Pt required cues for forward gaze to improve postural awareness.     Stairs            Wheelchair Mobility    Modified Rankin (Stroke  Patients Only)       Balance Overall balance assessment: Needs assistance   Sitting balance-Leahy Scale: Good       Standing balance-Leahy Scale: Fair Standing balance comment: Pt is reliant on UEs for balance and stability.                 High Level Balance Comments: Pt performed narrow BOS with eyes open and closed without UE support.  Pt performed modified SLS with unilateral support of UEs.  Pt able to stabilize for 20-30 seconds before righting balance.  Pt more challenged with eyes closed.  Fatigue noted post standing activities.      Cognition Arousal/Alertness: Awake/alert Behavior During Therapy: WFL for tasks assessed/performed Overall Cognitive Status: Within Functional Limits for tasks assessed                      Exercises General Exercises - Lower Extremity Hip ABduction/ADduction: AROM;Both;10 reps;Standing Hip Flexion/Marching: AROM;Both;10 reps;Standing Mini-Sqauts: AROM;Both;10 reps;Standing Other Exercises Other Exercises: standing hip extension.      General Comments        Pertinent Vitals/Pain Pain Assessment: No/denies pain    Home Living Family/patient expects to be discharged to:: Unsure ("? [facility]")                    Prior Function            PT Goals (current goals can now be found in the care plan section) Acute Rehab PT Goals Patient Stated Goal: to go home.  Potential to Achieve Goals: Fair Progress towards PT goals: Progressing toward goals    Frequency  Min 3X/week    PT Plan Current plan remains appropriate    Co-evaluation             End of Session Equipment Utilized During Treatment: Gait belt Activity Tolerance: Patient tolerated treatment well Patient left: in chair;with call bell/phone within reach     Time: 1455-1523 PT Time Calculation (min) (ACUTE ONLY): 28 min  Charges:  $Gait Training: 8-22 mins $Therapeutic Activity: 8-22 mins                    G Codes:       Cristela Blue 03-22-16, 3:49 PM Governor Rooks, PTA pager [phone]

## 2016-02-27 NOTE — Progress Notes (Signed)
MD called and he gave orders for D5 0.5%normal saline with 55mmols kcl as pt is npo.

## 2016-02-27 NOTE — Progress Notes (Signed)
Confirmed with Dr Constance Holster that he wanted a gastrostomy tube placed and relayed message to IR PA. Also asked IR to directly contact Dr Constance Holster and make sure that current orders are implemented

## 2016-02-27 NOTE — Progress Notes (Signed)
Stoma cleaned and repacked with kerlix as demonstrated by MD this morning. Covered with 4x4 gauze and tape

## 2016-02-27 NOTE — Progress Notes (Signed)
Patient ID: Timothy Suarez, male   DOB: 12-Jun-1925, 82 y.o.   MRN: [id number] NG feeding tube came out last night. Wound unpacked and leaking mucous. No infection. Stoma cleaned of mucous. Wound packed, instructions given face to face to nursing staff. No 4x4's available, will get some from OR. Continue wound care.   Gastrostomy tube.

## 2016-02-27 NOTE — Progress Notes (Signed)
IR PA will discuss with family tomorrow regarding gastrostomy tube placement. Dr Constance Holster will evaluate pt tomorrow as well as pt currently has no feeding tube for po meds and feeding

## 2016-02-27 NOTE — Progress Notes (Signed)
Pt paged regarding need for clarification of gastrostomy tube placement ordered as requested by IR. Also txt paged request for iv fluids to MD. Waiting for response from MD

## 2016-02-28 ENCOUNTER — Encounter (HOSPITAL_COMMUNITY): Payer: Self-pay | Admitting: Radiology

## 2016-02-28 ENCOUNTER — Inpatient Hospital Stay (HOSPITAL_COMMUNITY): Payer: Medicare Other

## 2016-02-28 MED ORDER — IOPAMIDOL (ISOVUE-300) INJECTION 61%
INTRAVENOUS | Status: AC
  Administered 2016-02-28: 10 mL
  Filled 2016-02-28: qty 50

## 2016-02-28 MED ORDER — FENTANYL CITRATE (PF) 100 MCG/2ML IJ SOLN
INTRAMUSCULAR | Status: AC
Start: 2016-02-28 — End: 2016-02-29
  Filled 2016-02-28: qty 2

## 2016-02-28 MED ORDER — SODIUM CHLORIDE 0.9 % IV SOLN
INTRAVENOUS | Status: AC | PRN
  Administered 2016-02-28: 10 mL/h via INTRAVENOUS

## 2016-02-28 MED ORDER — MIDAZOLAM HCL 2 MG/2ML IJ SOLN
INTRAMUSCULAR | Status: AC
  Filled 2016-02-28: qty 2

## 2016-02-28 MED ORDER — FENTANYL CITRATE (PF) 100 MCG/2ML IJ SOLN
INTRAMUSCULAR | Status: AC | PRN
  Administered 2016-02-28: 25 ug via INTRAVENOUS
  Administered 2016-02-28: 50 ug via INTRAVENOUS

## 2016-02-28 MED ORDER — MIDAZOLAM HCL 2 MG/2ML IJ SOLN
INTRAMUSCULAR | Status: AC | PRN
  Administered 2016-02-28: 1 mg via INTRAVENOUS
  Administered 2016-02-28: 0.5 mg via INTRAVENOUS

## 2016-02-28 MED ORDER — LIDOCAINE HCL 1 % IJ SOLN
INTRAMUSCULAR | Status: AC
  Filled 2016-02-28: qty 20

## 2016-02-28 NOTE — Progress Notes (Signed)
Patient ID: Timothy Suarez, male   DOB: 03/05/1925, 82 y.o.   MRN: [id number] Doing well, no complaints. PEG placed earlier today. Stoma clean. Packing changed. Continue care.

## 2016-02-28 NOTE — Consult Note (Signed)
Chief Complaint: Patient was seen in consultation today for percutaneous gastric tube placement Chief Complaint  Patient presents with  . Post-op Problem   at the request of Dr Izora Gala  Referring Physician(s): Dr Izora Gala  Supervising Physician: Aletta Edouard  Patient Status: Inpatient  History of Present Illness: Timothy Suarez is a 82 y.o. male   Hx Laryngeal Ca Salvage laryngectomy 01/15/2016 Was eating well; regular diet Until noted small leakage from surgical incision Development of pharyngocutaneous fistula Packing placed per Dr Constance Holster Healing well Pt must be NPO for 2-6 weeks NG tube was placed x 2 but pt has "pulled out" Dysphagia; malnutrition Request made for percutaneous gastric tube placement  Past Medical History  Diagnosis Date  . MVP (mitral valve prolapse)     S/P Rt mini thoractomy for Mitral Valve repair  . Paroxysmal atrial fibrillation (HCC)     S/P Maze procedure  . Depression   . Hernia   . Aortic valve insufficiency, acquired   . Dyslipidemia   . Glaucoma   . BPH (benign prostatic hyperplasia)   . A-fib ([place])   . Cerebral ischemia   . Sleep disturbance   . Mild cognitive impairment   . Thoracic aortic aneurysm ([place])   . Major depression ([place])   . Hyperlipidemia   . History of echocardiogram     Echo 5/16:  Mild LVH, EF 50-55%, Gr 1 DD, septal HK, Ao sclerosis without stenosis, mild AI, MV repair ok with borderline mild MS (mean 4 mmHg), mild MR, mild LAE, mod RAE, mild TR, PASP 30 mmHg  . Hx of cardiovascular stress test     Lexiscan Myoview 5/16:  Apical thinning, EF not gated, no ischemia. Low Risk  . Coronary artery disease     50-70% LAD mitral valve repair 4+ yrs ago  . Shortness of breath dyspnea     with exertion  . HTN (hypertension)     takes Amlodipine daily  . Arthritis   . Urinary urgency   . Urinary frequency   . Nocturia   . Complication of anesthesia     "woke up w/confusion and hallucinations once  after mitral valve OR"  . High cholesterol   . Heart murmur   . Colon cancer [facility])      s/p partial colectomy  . Prostate cancer ([place])   . Right vocal cord cancer (HCC)     invasive squamous cell carcinoma   . Pharyngocutaneous fistula hospitalized 02/21/2016     s/p salvage laryngectomy    Past Surgical History  Procedure Laterality Date  . Flexhd patch repair of chest wall hernia.  01/28/2011    [name]  . Colectomy  2004    Dr [name]  . Tympanoplasty  1967    "? side"  . Mitral valve repair  12/27/2008    complex valvuloplasty with 63mm Memo 3D annuloplasty ring via right minithoracotomy  . Maze  12/27/2008    left side lesion set  . Tee without cardioversion N/A 01/03/2013    Procedure: TRANSESOPHAGEAL ECHOCARDIOGRAM (TEE);  Surgeon: Sueanne Margarita, MD;  Location: [place];  Service: Cardiovascular;  Laterality: N/A;  . Total knee arthroplasty Right 2003  . Microlaryngoscopy Right 01/29/2015    Procedure: MICROLARYNGOSCOPY WITH BIOSPY OF RIGHT VOCAL CORD;  Surgeon: Izora Gala, MD;  Location: [place];  Service: ENT;  Laterality: Right;  . Prostate biopsy    . Laryngetomy N/A 01/15/2016    Procedure:  TOTAL LARYNGECTOMY;  Surgeon:  Izora Gala, MD;  Location: [facility] OR;  Service: ENT;  Laterality: N/A;  . Direct laryngoscopy N/A 01/15/2016    Procedure: DIRECT LARYNGOSCOPY WITH BIOPSY AND FROZEN SECTION;  Surgeon: Izora Gala, MD;  Location: [place];  Service: ENT;  Laterality: N/A;  . Joint replacement    . Cataract extraction w/ intraocular lens  implant, bilateral Bilateral   . Cardiac catheterization      Allergies: Zoloft; Citalopram; and Trazodone and nefazodone  Medications: Prior to Admission medications   Medication Sig Start Date End Date Taking? Authorizing Provider  amLODipine (NORVASC) 2.5 MG tablet TAKE 1 TABLET (2.5 MG TOTAL) BY MOUTH DAILY. FOR BLOOD PRESSURE Patient taking differently: TAKE 1 TABLET (2.5 MG TOTAL) BY MOUTH DAILY AT BEDTIME FOR BLOOD PRESSURE 01/13/16   Yes Scott T Kathlen Mody, PA-C  atorvastatin (LIPITOR) 40 MG tablet TAKE 1 TABLET BY MOUTH DAILY AT 6PM FOR CHOLESTEROL Patient taking differently: Take 40 mg by mouth at bedtime.  07/08/15  Yes Tiffany L Reed, DO  Cholecalciferol (VITAMIN D) 2000 units CAPS Take one capsule by mouth once daily Patient taking differently: Take 2,000 Units by mouth at bedtime. Take one capsule by mouth once dail 11/06/15  Yes Tiffany L Reed, DO  levothyroxine (SYNTHROID, LEVOTHROID) 50 MCG tablet Take 50 mcg by mouth at bedtime.  02/13/16  Yes Historical Provider, MD  zolpidem (AMBIEN) 5 MG tablet Take 1 tablet (5 mg total) by mouth at bedtime as needed. for sleep Patient taking differently: Take 2.5 mg by mouth at bedtime as needed for sleep.  02/17/16  Yes Lauree Chandler, NP  HYDROcodone-acetaminophen (HYCET) 7.5-325 mg/15 ml solution Take 10 mLs by mouth 4 (four) times daily as needed for moderate pain. Patient not taking: Reported on 01/27/2016 01/22/16   Izora Gala, MD     Family History  Problem Relation Age of Onset  . Cancer Mother     lymphoma  . Cancer Father     pancreatic  . Heart attack Neg Hx   . Stroke Neg Hx     Social History   Social History  . Marital Status: Widowed    Spouse Name: N/A  . Number of Children: 2  . Years of Education: 14   Occupational History  . retired Nutritional therapist     Social History Main Topics  . Smoking status: Never Smoker   . Smokeless tobacco: Never Used  . Alcohol Use: 8.4 oz/week    14 Shots of liquor, 0 Standard drinks or equivalent per week     Comment: 02/27/2016 "2, 1 shot mixed drinks/day"  . Drug Use: No  . Sexual Activity: No   Other Topics Concern  . None   Social History Narrative   Widowed   Never smoked   Alcohol  Occasionally    Exercise - walking the [employer] with cane   Edith Endave, HCPOA                Review of Systems: A 12 point ROS discussed and pertinent positives are indicated in the HPI above.   All other systems are negative.  Review of Systems  Constitutional: Positive for appetite change and unexpected weight change. Negative for fever, activity change and fatigue.  HENT: Positive for hearing loss.   Respiratory: Negative for shortness of breath.   Neurological: Positive for weakness.  Psychiatric/Behavioral: Negative for behavioral problems and confusion.    Vital Signs: BP 144/62 mmHg  Pulse  98  Temp(Src) 98.4 F (36.9 C) (Oral)  Resp 20  Ht 6' (1.829 m)  Wt 187 lb 4.8 oz (84.959 kg)  BMI 25.40 kg/m2  SpO2 100%  Physical Exam  Constitutional: He is oriented to person, place, and time.  Cardiovascular: Normal rate, regular rhythm and normal heart sounds.   Pulmonary/Chest: Effort normal and breath sounds normal.  Abdominal: Soft. Bowel sounds are normal.  Musculoskeletal: Normal range of motion.  Neurological: He is alert and oriented to person, place, and time.  Skin: Skin is warm and dry.  Psychiatric: He has a normal mood and affect. His behavior is normal.  Very hard of hearing Consented with Dtr Debra via phone  Nursing note and vitals reviewed.   Mallampati Score:  MD Evaluation Airway: WNL Heart: WNL Abdomen: WNL Chest/ Lungs: WNL ASA  Classification: 3 Mallampati/Airway Score: Two  Imaging: Ct Abdomen Wo Contrast  02/27/2016  CLINICAL DATA:  Dysphagia, preoperative evaluation for percutaneous gastrostomy EXAM: CT ABDOMEN WITHOUT CONTRAST TECHNIQUE: Multidetector CT imaging of the abdomen was performed following the standard protocol without IV contrast. COMPARISON:  01/16/2015 FINDINGS: Lower chest: Similar chronic subpleural bibasilar scarring. Cardiomegaly evident. Previous mitral valve replacement noted. No pericardial or pleural effusion. Small hiatal hernia. Hepatobiliary: No mass visualized on this un-enhanced exam. Pancreas: No mass or inflammatory process identified on this un-enhanced exam. Spleen: Within normal limits in size.  Adrenals/Urinary Tract: Normal adrenal glands for age. No renal obstruction or hydronephrosis. Upper pole hypodense renal cyst as before, larger on the right measuring 3 cm, image 28. Stomach/Bowel: The stomach is just beneath the left upper quadrant subcostal margin and anteriorly positioned. The stomach is also superior to the transverse colon. Position of the stomach appears amenable to percutaneous gastrostomy. Negative for bowel obstruction, significant dilatation, ileus, or free air. No fluid collection or abscess. Vascular/Lymphatic: No adenopathy. Atherosclerosis of the aorta as well as tortuosity. No retroperitoneal hemorrhage or acute vascular process. Other: Intact abdominal wall.  No ventral hernia. Musculoskeletal: Marked degenerative spondylosis of the spine with a mild levoscoliosis. No acute osseous finding. Mild chronic compression deformity at L1. IMPRESSION: Stomach location appears amenable to percutaneous gastrostomy. Small hiatal hernia noted. Other chronic findings as above.  No acute intra-abdominal process. Abdominal aortic atherosclerosis and tortuosity. Electronically Signed   By: Jerilynn Mages.  Shick M.D.   On: 02/27/2016 16:17   Dg Chest Portable 1 View  02/21/2016  CLINICAL DATA:  Tube placement. EXAM: PORTABLE CHEST 1 VIEW COMPARISON:  01/15/2016 FINDINGS: Portable AP chest at 2148 hours shows a small bore catheter extending down over the mediastinum with the tip apparently positioned in the gastric fundus. The cardio pericardial silhouette is enlarged. Basilar chronic atelectasis or scarring noted. IMPRESSION: Apparent feeding tube placement. Although the entire course of the catheter has not been included on the film, the radiopaque tip does project over the expected location of the gastric fundus. Electronically Signed   By: Misty Stanley M.D.   On: 02/21/2016 21:57   Dg Abd Portable 1v  02/24/2016  CLINICAL DATA:  Encounter for feeding tube placement. EXAM: PORTABLE ABDOMEN - 1 VIEW  COMPARISON:  Radiograph of February 23, 2016. FINDINGS: The bowel gas pattern is normal. Atherosclerosis of abdominal aorta is noted. Distal tip of feeding tube is unchanged and in expected position of gastroesophageal junction. Extensive degenerative changes are noted in the lumbar spine. IMPRESSION: Aortic atherosclerosis. Position of distal tip of feeding tube is unchanged and in expected position of gastroesophageal junction. Electronically Signed  By: Marijo Conception, M.D.   On: 02/24/2016 14:37   Dg Abd Portable 1v  02/23/2016  CLINICAL DATA:  Feeding tube check EXAM: PORTABLE ABDOMEN - 1 VIEW COMPARISON:  Same day FINDINGS: There is a feeding tube with the metallic portion just beyond the gastroesophageal junction. There is gaseous distention of small bowel and colon concerning for an ileus. There is no evidence of pneumoperitoneum, portal venous gas or pneumatosis. There are no pathologic calcifications along the expected course of the ureters. There is abdominal aortic atherosclerosis. The osseous structures are unremarkable. IMPRESSION: Feeding tube with the metallic portion just be on the gastroesophageal junction. Recommend advancing the feeding tube 10 cm. Electronically Signed   By: Kathreen Devoid   On: 02/23/2016 14:19   Dg Abd Portable 1v  02/23/2016  CLINICAL DATA:  NG tube placement EXAM: PORTABLE ABDOMEN - 1 VIEW COMPARISON:  02/22/2016. FINDINGS: There is no NG tube visualized on this film. New the patient has an NG tube in place, the tip of the feeding catheter projects over the medial aspect of the lower thorax. Bowel gas pattern is nonspecific. IMPRESSION: 1. No NG tube on this film. If the patient has an NG tube, consider repositioning or obtaining chest x-ray to evaluate position. 2. Feeding tube tip overlies the medial left lung base. This is probably in the distal esophagus although airway position cannot be entirely excluded. Correlate clinically and consider repositioning with repeat  imaging. Electronically Signed   By: Misty Stanley M.D.   On: 02/23/2016 11:19   Dg Abd Portable 1v  02/22/2016  CLINICAL DATA:  NG tube placement EXAM: PORTABLE ABDOMEN - 1 VIEW COMPARISON:  02/22/2016 FINDINGS: Enteric tube placed with tip in the right upper quadrant consistent with location in the proximal duodenum. Scattered gas and stool throughout the colon. No small or large distention. Aortic calcifications. Degenerative changes in spine. IMPRESSION: Enteric tube tip is in the right abdomen consistent with location of the proximal duodenum. Aortic atherosclerosis. Electronically Signed   By: Lucienne Capers M.D.   On: 02/22/2016 23:12   Dg Abd Portable 1v  02/22/2016  CLINICAL DATA:  82 year old male verifying enteric tube placement. Initial encounter. EXAM: PORTABLE ABDOMEN - 1 VIEW COMPARISON:  1847 hours today.  CT Abdomen and Pelvis 01/16/2015. FINDINGS: Portable AP supine view at 1941 hours. Enteric tube tip appears to be at the level of the gastric fundus, and the tube is no longer looped in the distal esophagus. The tube could be advanced about 10 more cm to allow for the tip to reached the level of the distal stomach. Stable bowel gas pattern. Stable visible lung bases. Calcified aortic atherosclerosis. Levoconvex lumbar scoliosis with degenerative changes. Stable visualized osseous structures. IMPRESSION: 1. Improved enteric tube placement, tip suspected at the GE junction or just inside the stomach. The tube is no longer looped in the distal esophagus. Advance 10-15 cm to allow for the tip to reach the distal stomach or proximal duodenum. And a repeat film could be obtained at that time. 2. Bowel gas pattern remains negative.  Stable visible lung bases. Electronically Signed   By: Genevie Ann M.D.   On: 02/22/2016 20:02   Dg Abd Portable 1v  02/22/2016  CLINICAL DATA:  82 year old male with feeding tube placement. EXAM: PORTABLE ABDOMEN - 1 VIEW COMPARISON:  Abdominal CT dated 01/16/2015  FINDINGS: A feeding tube is partially visualized. The lower portion of the tube appears cord over the left cardiac silhouette. The positioning of  the tube is not well evaluated and this single provided view but the tip appears to be at the gastroesophageal junction. The superior portion of the posterior appears to extend over the mediastinal silhouette. This tube may be coiled in hiatal hernia. However no hiatal hernia is seen on the CT dated 01/16/2015. A partially visualized tube is noted extending from the upper edge of the image into the right hilar region. It is indeterminate whether this is part of the enteric tube or related to a secondary support device. Repeat radiograph with inclusion of the chest and abdomen and better positioning of the patient is recommended for better evaluation. There is no bowel dilatation to no free air. There is extensive degenerative changes of the spine. No acute fractures. Atherosclerotic calcification of the aorta. IMPRESSION: Feeding tube coiled over the left cardiac border with tip likely at the gastroesophageal junction. Repeat radiograph with better positioning of the patient and inclusion of the chest and abdomen recommended. Electronically Signed   By: Anner Crete M.D.   On: 02/22/2016 19:02    Labs:  CBC:  Recent Labs  01/16/16 0300 01/17/16 0217 02/21/16 1823 02/27/16 1353  WBC 13.2* 10.4 8.3 7.6  HGB 12.9* 12.0* 12.4* 12.9*  HCT 40.3 37.6* 38.3* 39.6  PLT 160 165 203 206    COAGS:  Recent Labs  02/27/16 1353  INR 1.07  APTT 33    BMP:  Recent Labs  01/15/16 0923 01/16/16 0300 02/21/16 1823 02/27/16 1353  NA 139 138 140 139  K 4.2 3.9 3.7 4.0  CL 110 106 111 109  CO2 21* 26 24 26   GLUCOSE 116* 137* 104* 102*  BUN 18 12 27* 22*  CALCIUM 9.4 8.5* 9.0 8.8*  CREATININE 1.12 1.01 1.22 0.98  GFRNONAA 57* >60 51* >60  GFRAA >60 >60 59* >60    LIVER FUNCTION TESTS:  Recent Labs  08/26/15 1042 11/04/15 1537 02/21/16 1823   BILITOT 0.8 0.6 0.6  AST 19 16 19   ALT 14 12 14*  ALKPHOS 97 104 123  PROT 6.7 6.7 6.8  ALBUMIN 4.2 4.3 3.4*    TUMOR MARKERS: No results for input(s): AFPTM, CEA, CA199, CHROMGRNA in the last 8760 hours.  Assessment and Plan:  Laryngeal Ca Salvage laryngectomy 01/2016 Developed phayngocutaneous fistula- must be npo for weeks Pulling out NG tubes x 2 Scheduled now for percutaneous gastric tube placement Risks and Benefits discussed with the patient including, but not limited to the need for a barium enema during the procedure, bleeding, infection, peritonitis, or damage to adjacent structures. All of the patient's questions were answered, patient is agreeable to proceed. Consent signed and in chart.   Thank you for this interesting consult.  I greatly enjoyed meeting MILLIE ARSLAN and look forward to participating in their care.  A copy of this report was sent to the requesting provider on this date.  Electronically Signed: Monia Sabal A 02/28/2016, 8:58 AM   I spent a total of 40 Minutes    in face to face in clinical consultation, greater than 50% of which was counseling/coordinating care for perc G tube

## 2016-02-28 NOTE — Care Management Note (Signed)
Case Management Note  Patient Details  Name: Timothy Suarez MRN: [id number] Date of Birth: [birth year]  Subjective/Objective:                    Action/Plan: Await G tube placement , recommendations for tube feeds at home   Expected Discharge Date:                  Expected Discharge Plan:  [place]  In-House Referral:     Discharge planning Services  CM Consult  Post Acute Care Choice:  Home Health, Durable Medical Equipment Choice offered to:     DME Arranged:    DME Agency:     HH Arranged:    [place] Agency:     Status of Service:  In process, will continue to follow  If discussed at Long Length of Stay Meetings, dates discussed:    Additional Comments:  [name], RN 02/28/2016, 11:24 AM

## 2016-02-28 NOTE — Care Management Important Message (Signed)
Important Message  Patient Details  Name: Timothy Suarez MRN: [id number] Date of Birth: 14-Apr-1925   Medicare Important Message Given:  Yes    Loann Quill 02/28/2016, 8:34 AM

## 2016-02-28 NOTE — Progress Notes (Signed)
Speech Language Pathology Treatment: Cognitive-Linquistic  Patient Details Name: Timothy Suarez MRN: [id number] DOB: 1924/08/20 Today's Date: 02/28/2016 Time: [id number] SLP Time Calculation (min) (ACUTE ONLY): 10 min  Assessment / Plan / Recommendation Clinical Impression  Session brief today, as transport came to get pt for PEG tube placement. Pt was able to engage in conversation with SLP, using white board to communicate. Spelling errors and legibility adversely affect communicative effectiveness. Pt able to mouth words, which is understandable when context known and lip reading is utilized. Pt pleasant and cooperative, and indicated he was able to express wants and needs to staff, and could ask questions effectively.    HPI HPI: 82 year old male underwent salvage laryngectomy 01/15/16. He seemed to be doing well and was swallowing a regular diet. He noticed some worsening of secretions in the airway over the past week. His speech pathologist observed water leaking from the incision after swallowing and he was instructed to come to the ER for admission. Pt has been made NPO while wound heals and has an NG tube. Pt will have a TEP eventually per Dr. Constance Holster. He has been working with a home health SLP and reports he is learning to use an IPAD with an app to type and select words.       SLP Plan   Continue per POC     Oral Care Recommendations: Oral care BID Follow up Recommendations: Home health SLP     Shonna Chock 02/28/2016, 12:39 PM  Cattleya Dobratz B. Quentin Ore [facility], [place] [phone]

## 2016-02-29 MED ORDER — ATORVASTATIN CALCIUM 40 MG PO TABS
40.0000 mg | ORAL_TABLET | Freq: Every day | ORAL | Status: DC
  Administered 2016-02-29 – 2016-03-02 (×3): 40 mg
  Filled 2016-02-29 (×3): qty 1

## 2016-02-29 MED ORDER — AMLODIPINE BESYLATE 2.5 MG PO TABS
2.5000 mg | ORAL_TABLET | Freq: Every day | ORAL | Status: DC
  Administered 2016-03-01 – 2016-03-03 (×3): 2.5 mg
  Filled 2016-02-29 (×4): qty 1

## 2016-02-29 MED ORDER — LEVOTHYROXINE SODIUM 50 MCG PO TABS
50.0000 ug | ORAL_TABLET | Freq: Every day | ORAL | Status: DC
  Administered 2016-02-29 – 2016-03-02 (×3): 50 ug
  Filled 2016-02-29 (×3): qty 1

## 2016-02-29 NOTE — Progress Notes (Signed)
Patient ID: Timothy Suarez, male   DOB: 17-Feb-1925, 82 y.o.   MRN: [id number] Awake and alert, no complaints. Packing was out, I replaced it. Instructions given to nursing staff to make sure there is always packing in the wound.  We will start tube feeds today, D/C IV and Abx.  Continue wound care.

## 2016-02-29 NOTE — Procedures (Signed)
Interventional Radiology Procedure Note  Procedure: Percutaneous gastrostomy  Complications: None  Estimated Blood Loss: < 10 mL  20 Fr bumper retention gastrostomy tube placed with tip in body of stomach.  Venetia Night. Kathlene Cote, M.D Pager:  [phone]

## 2016-03-01 MED ORDER — BACITRACIN-NEOMYCIN-POLYMYXIN OINTMENT TUBE
TOPICAL_OINTMENT | Freq: Every day | CUTANEOUS | Status: DC
  Administered 2016-03-01 – 2016-03-03 (×3): via TOPICAL
  Filled 2016-03-01 (×2): qty 1

## 2016-03-01 NOTE — Progress Notes (Signed)
Patient ID: Timothy Suarez, male   DOB: 1925/06/12, 82 y.o.   MRN: [id number] No complaints. He removed the dressing and the packing this morning. Clear mucoid drainage around stoma from wound. Packing replaced.   Doing very well. Slow progress healing fistula. We will see about placement to a SNF for tube feeds and wound care.

## 2016-03-01 NOTE — Progress Notes (Signed)
Patient ID: Timothy Suarez, male   DOB: July 19, 1925, 82 y.o.   MRN: [id number] Doing well, no drainage, packing and dressing in place, stoma clear. Continue work on placement.

## 2016-03-02 ENCOUNTER — Ambulatory Visit: Payer: Medicare Other | Admitting: Rehabilitative and Restorative Service Providers"

## 2016-03-02 NOTE — Progress Notes (Signed)
Called [facility] and gave the wound care nurse  Tela step by step instructions for dressing the fistula as instructed by Dr Constance Holster. She voiced understanding the instructions clearly

## 2016-03-02 NOTE — Progress Notes (Signed)
Physical Therapy Treatment Patient Details Name: QUASIR CAPEHART MRN: [id number] DOB: 03/12/1925 Today's Date: 03/02/2016    History of Present Illness Patient is a 82 y/o male with hx of laryngectomy, A-fib, HTN, HLD, colon ca, CAD, major depression, Thoracic aortic aneurysm presents with Pharyngocutaneous fistula s/p salvage laryngectomy. NG tube D/C, PEG tube placement completed 02/29/16.     PT Comments    Pt tolerated session well, improving mobility and dynamic stability. Pt may be going to SNF for further medical care following acute stay. PT to continue to follow and advance as tolerated.   Follow Up Recommendations  Home health PT;Supervision for mobility/OOB     Equipment Recommendations  None recommended by PT    Recommendations for Other Services       Precautions / Restrictions Precautions Precautions: Other (comment) Precaution Comments: PEG tube Restrictions Weight Bearing Restrictions: No    Mobility  Bed Mobility               General bed mobility comments: in chair upon arrival and requesting return following session.   Transfers Overall transfer level: Needs assistance Equipment used: Rolling walker (2 wheeled) Transfers: Sit to/from Stand Sit to Stand: Supervision         General transfer comment: steady transfer, no assistance needed. Supervision for safety.   Ambulation/Gait Ambulation/Gait assistance: Supervision Ambulation Distance (Feet): 400 Feet Assistive device: Rolling walker (2 wheeled) Gait Pattern/deviations: Step-through pattern Gait velocity: decreased   General Gait Details: mild flexed trunk with fatigue, no loss of balance.    Stairs            Wheelchair Mobility    Modified Rankin (Stroke Patients Only)       Balance Overall balance assessment: Needs assistance Sitting-balance support: No upper extremity supported Sitting balance-Leahy Scale: Good     Standing balance support: No upper extremity  supported Standing balance-Leahy Scale: Fair Standing balance comment: able to stand without UE support                    Cognition Arousal/Alertness: Awake/alert Behavior During Therapy: WFL for tasks assessed/performed Overall Cognitive Status: Within Functional Limits for tasks assessed                      Exercises      General Comments        Pertinent Vitals/Pain Pain Assessment: No/denies pain    Home Living                      Prior Function            PT Goals (current goals can now be found in the care plan section) Acute Rehab PT Goals Patient Stated Goal: get out of the hospital. PT Goal Formulation: With patient Time For Goal Achievement: 03/09/16 Potential to Achieve Goals: Fair Progress towards PT goals: Progressing toward goals    Frequency  Min 3X/week    PT Plan Current plan remains appropriate    Co-evaluation             End of Session Equipment Utilized During Treatment: Other (comment) (pt declined use of gait belt) Activity Tolerance: Patient tolerated treatment well Patient left: in chair;with call bell/phone within reach     Time: 1421-1443 PT Time Calculation (min) (ACUTE ONLY): 22 min  Charges:  $Gait Training: 8-22 mins  G Codes:      Cassell Clement, PT, CSCS Pager [phone] Office [phone]  03/02/2016, 3:03 PM

## 2016-03-02 NOTE — Clinical Social Work Note (Signed)
CSW presented bed offers to patient and his family.  Patient chose [facility] for tube feedings and wound care.  CSW contacted [facility] who is able to accept patient tomorrow if he is medically ready for discharge and orders have been received.  CSW contacted physician's office to inform them that patient has a bed available.  CSW to continue to follow patient's progress throughout discharge planning.  Jones Broom. Soper, MSW, [place] 03/02/2016 4:45 PM

## 2016-03-02 NOTE — Clinical Social Work Note (Addendum)
Clinical Social Work Assessment  Patient Details  Name: Timothy Suarez MRN: [id number] Date of Birth: June 11, 1925  Date of referral:  03/02/16               Reason for consult:  Facility Placement                Permission sought to share information with:  Facility Sport and exercise psychologist, Family Supports Permission granted to share information::  Yes, Verbal Permission Granted  Name::     [name] Daughter [phone]  [phone]   Agency::  SNF admissions  Relationship::     Contact Information:     Housing/Transportation Living arrangements for the past 2 months:  [place]. Source of Information:  Patient Patient Interpreter Needed:  None Criminal Activity/Legal Involvement Pertinent to Current Situation/Hospitalization:  No - Comment as needed Significant Relationships:  Adult Children Lives with:  Self Do you feel safe going back to the place where you live?  No Need for family participation in patient care:  No (Coment)  Care giving concerns:  Patient feels he needs to go to SNF for wound care and peg tube care.   Social Worker assessment / plan:  Patient is an 82 year old male who is alert and oriented x4.  Patient is able to communicate by writing down, and mouthing words due to his previous surgery.  Patient expressed he has never been to SNF CSW explained to patient what to expect and how insurance will pay for his stay. Patient states he is living at [facility].  Patient states he has been there for a few years now.  Patient states he is pleased with where he is living.  Patient is positive and motivated to get well again in order to return back home.  Patient expressed that he would like to stay in the [place] area if possible, CSW explained to patient that there are many facilities in the area and it will just depend on which place is able to meet his needs.  Patient gave CSW permission to fax out his information to  different facilities in [facility].    Employment status:  Retired Nurse, adult PT Recommendations:  [place] / Referral to community resources:  [place]  Patient/Family's Response to care:  Patient is in agreement to going to SNF for wound care and peg tube feedings.  Patient/Family's Understanding of and Emotional Response to Diagnosis, Current Treatment, and Prognosis:  Patient is positive and motivated to get his wound taken care of appropriately.  Patient is aware of current treatment and prognosis and he is hoping the SNF will make his wound heal appropriately.  Emotional Assessment Appearance:  Appears stated age Attitude/Demeanor/Rapport:    Affect (typically observed):  Appropriate, Calm Orientation:  Oriented to Self, Oriented to Place, Oriented to  Time Alcohol / Substance use:  Not Applicable Psych involvement (Current and /or in the community):  No (Comment)  Discharge Needs  Concerns to be addressed:  Lack of Support Readmission within the last 30 days:  No Current discharge risk:  Lack of support system, Lives alone Barriers to Discharge:  No Barriers Identified   Ross Ludwig, LCSWA 03/02/2016, 1:34 PM

## 2016-03-02 NOTE — Care Management Important Message (Signed)
Important Message  Patient Details  Name: Timothy Suarez MRN: [id number] Date of Birth: 04-15-1925   Medicare Important Message Given:  Yes    Loann Quill 03/02/2016, 11:11 AM

## 2016-03-02 NOTE — Progress Notes (Signed)
Nutrition Follow-up  DOCUMENTATION CODES:   Severe malnutrition in context of chronic illness  INTERVENTION:   Continue Jevity 1.2 @ 75 ml/hr via PEG 30 ml Prostat daily Provides: 2260 kcal, 115 grams protein, and 1452 ml H2O.   NUTRITION DIAGNOSIS:   Inadequate oral intake related to inability to eat as evidenced by NPO status.  Ongoing  GOAL:   Patient will meet greater than or equal to 90% of their needs  Met with TF  MONITOR:   TF tolerance, Weight trends, Labs  REASON FOR ASSESSMENT:   Consult Enteral/tube feeding initiation and management  ASSESSMENT:   82 year old male underwent salvage laryngectomy 01/15/16. He seemed to be doing well and was swallowing a regular diet. He has noticed some worsening of secretions in the airway over the past week. Today, his speech pathologist observed water leaking from the incision after swallowing and he was instructed to come to the ER for admission.  Pt pulled feeding tube out on 02/27/16. Pt underwent PEG placement on 02/28/16.   Pt with with CSW at time of visit. Case discussed with RN; confirmed pt is tolerating TF well- Jevity 1.2 currently infusing via PEG without issues. Pt also receives 30 ml Prostat daily and noted order for 250 ml free water flush every 6 hours. Complete regimen provides 2260 kcal, 115 grams protein, and 1452 ml H2O (1552 ml fluid with free water flush regimen), which meets 100% of estimated kcal and protein needs.   Per RN, plan to continue with TF until fistula improves, when pt will transition to PO diet. RN confirms plan to d/c to SNF, likely later today or 03/03/16.   Labs reviewed.   Diet Order:  Diet NPO time specified  Skin:  Reviewed, no issues  Last BM:  02/27/16  Height:   Ht Readings from Last 1 Encounters:  02/21/16 6' (1.829 m)    Weight:   Wt Readings from Last 1 Encounters:  02/21/16 187 lb 4.8 oz (85 kg)    Ideal Body Weight:  81 kg  BMI:  Body mass index is 25.4  kg/m.  Estimated Nutritional Needs:   Kcal:  2125  Protein:  102-110 gr  Fluid:  2.1 liters daily  EDUCATION NEEDS:   No education needs identified at this time  Santiaga Butzin A. Jimmye Norman, RD, LDN, CDE Pager: [phone] After hours Pager: [phone]

## 2016-03-02 NOTE — [place] FL2 (Signed)
[place] MEDICAID FL2 LEVEL OF CARE SCREENING TOOL     IDENTIFICATION  Patient Name: Timothy Suarez Birthdate: 05-08-1925 Sex: male Admission Date (Current Location): 02/21/2016  [facility] and [place] Number:  Herbalist and Address:  [facility] [place] [address] [place], [place] [address]      Provider Number: [id number]  Attending Physician Name and Address:  Izora Gala, MD  Relative Name and Phone Number:  [name] Daughter [phone]  [phone]     Current Level of Care: Hospital Recommended Level of Care: [place] Prior Approval Number:    Date Approved/Denied:   PASRR Number: [id number]  Discharge Plan: SNF    Current Diagnoses: Patient Active Problem List   Diagnosis Date Noted  . Protein-calorie malnutrition, severe 02/26/2016  . Pharyngocutaneous fistula 02/21/2016  . S/P laryngectomy 01/15/2016  . S/P biopsy 01/15/2016  . Vascular dementia with depressed mood 12/09/2015  . Baker's cyst of knee 12/09/2015  . Other fatigue 12/09/2015  . 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 Prostate cancer ([place]) 01/10/2015  . S/P MVR (mitral valve repair) 12/20/2014  . S/P Maze operation for atrial fibrillation 12/20/2014  . Obesity (BMI 30-39.9) 04/02/2014  . Prolonged Q-T interval on ECG 04/02/2014  . Mild cognitive impairment 04/02/2014  . Major depressive disorder, recurrent episode, mild ([place]) 02/08/2014  . Insomnia 02/08/2014  . Dizziness and giddiness 02/08/2014  . Generalized muscle weakness 02/08/2014  . Cerumen impaction 02/08/2014  . Glaucoma 02/08/2014  . Colon cancer ([place])   . Coronary artery disease   . Moderate aortic insufficiency 01/03/2013  . Mild mitral regurgitation 01/03/2013  . MVP (mitral valve prolapse)   . Paroxysmal atrial fibrillation (HCC)   . HTN (hypertension)   . Depression   . chest wall hernia (lung hernia)     Orientation  RESPIRATION BLADDER Height & Weight     Self, Time, Situation, Place  Normal Continent Weight: 187 lb 4.8 oz (85 kg) Height:  6' (182.9 cm)  BEHAVIORAL SYMPTOMS/MOOD NEUROLOGICAL BOWEL NUTRITION STATUS      Continent Diet, Feeding tube (Peg Tube)  AMBULATORY STATUS COMMUNICATION OF NEEDS Skin   Limited Assist Verbally Surgical wounds                       Personal Care Assistance Level of Assistance  Bathing, Dressing Bathing Assistance: Limited assistance   Dressing Assistance: Limited assistance     Functional Limitations Info  Sight, Hearing, Speech Sight Info: Adequate Hearing Info: Adequate Speech Info: Impaired (Patient has a fistula)    SPECIAL CARE FACTORS FREQUENCY  PT (By licensed PT)     PT Frequency: 3x a week       Speech Therapy Frequency: Minimum 2x a week      Contractures Contractures Info: Not present    Additional Factors Info  Allergies   Allergies Info: ZOLOFT SERTRALINE HCL, CITALOPRAM, TRAZODONE AND NEFAZODONE           Current Medications (03/02/2016):  This is the current hospital active medication list Current Facility-Administered Medications  Medication Dose Route Frequency Provider Last Rate Last Dose  . amLODipine (NORVASC) tablet 2.5 mg  2.5 mg Per Tube Daily Izora Gala, MD   2.5 mg at 03/02/16 1025  . atorvastatin (LIPITOR) tablet 40 mg  40 mg Per Tube QHS Izora Gala, MD   40 mg at 03/01/16 2131  . feeding supplement (JEVITY 1.2  CAL) liquid 1,000 mL  1,000 mL Per Tube Continuous Izora Gala, MD 75 mL/hr at 03/02/16 1034 1,000 mL at 03/02/16 1034  . feeding supplement (PRO-STAT SUGAR FREE 64) liquid 30 mL  30 mL Oral Daily Izora Gala, MD   30 mL at 03/02/16 1025  . free water 250 mL  250 mL Per Tube Q6H Izora Gala, MD   250 mL at 03/02/16 1245  . HYDROcodone-acetaminophen (HYCET) 7.5-325 mg/15 ml solution 10-15 mL  10-15 mL Per Tube Q4H PRN Melida Quitter, MD      . levothyroxine (SYNTHROID, LEVOTHROID) tablet 50 mcg  50 mcg  Per Tube QHS Izora Gala, MD   50 mcg at 03/01/16 2131  . neomycin-bacitracin-polymyxin (NEOSPORIN) ointment   Topical Daily Aletta Edouard, MD      . zolpidem [facility]) tablet 2.5 mg  2.5 mg Per Tube QHS PRN Melida Quitter, MD   2.5 mg at 02/26/16 2120     Discharge Medications: Please see discharge summary for a list of discharge medications.  Relevant Imaging Results:  Relevant Lab Results:   Additional Information SSN [id number]  Ross Ludwig, [place]

## 2016-03-02 NOTE — Clinical Social Work Placement (Signed)
   CLINICAL SOCIAL WORK PLACEMENT  NOTE  Date:  03/02/2016  Patient Details  Name: Timothy Suarez MRN: [id number] Date of Birth: 1925-05-14  Clinical Social Work is seeking post-discharge placement for this patient at the [place] level of care (*CSW will initial, date and re-position this form in  chart as items are completed):  Yes   Patient/family provided with [place] Work Department's list of facilities offering this level of care within the geographic area requested by the patient (or if unable, by the patient's family).  Yes   Patient/family informed of their freedom to choose among providers that offer the needed level of care, that participate in Medicare, Medicaid or managed care program needed by the patient, have an available bed and are willing to accept the patient.  Yes   Patient/family informed of [facility]'s ownership interest in [facility] and [facility], as well as of the fact that they are under no obligation to receive care at these facilities.  PASRR submitted to EDS on 03/02/16     PASRR number received on 03/02/16     Existing PASRR number confirmed on       FL2 transmitted to all facilities in geographic area requested by pt/family on 03/02/16     FL2 transmitted to all facilities within larger geographic area on       Patient informed that his/her managed care company has contracts with or will negotiate with certain facilities, including the following:            Patient/family informed of bed offers received.  Patient chooses bed at       Physician recommends and patient chooses bed at      Patient to be transferred to   on  .  Patient to be transferred to facility by       Patient family notified on   of transfer.  Name of family member notified:        PHYSICIAN Please sign FL2     Additional Comment:    _______________________________________________ Ross Ludwig,  LCSWA 03/02/2016, 1:48 PM

## 2016-03-03 MED ORDER — VITAMIN D 50 MCG (2000 UT) PO CAPS: 1.0000 | ORAL_CAPSULE | Freq: Every day | ORAL | 0 refills | Status: DC

## 2016-03-03 MED ORDER — AMLODIPINE BESYLATE 2.5 MG PO TABS: 2.5000 mg | ORAL_TABLET | Freq: Every day | ORAL | 0 refills | Status: DC

## 2016-03-03 MED ORDER — ATORVASTATIN CALCIUM 40 MG PO TABS: ORAL_TABLET | ORAL | 1 refills | Status: DC

## 2016-03-03 NOTE — Discharge Instructions (Signed)
Strict nothing by mouth  All nutrition and medication through gastrostomy.  Please have suction available in room for patient to use as needed.  Please make sure the wound above the stoma is always packed so that there is no mucus leaking. Contact Dr. Constance Holster at [phone] for any questions about wound care, and also I can send you a video on the specifics of his wound care.

## 2016-03-03 NOTE — Clinical Social Work Placement (Signed)
   CLINICAL SOCIAL WORK PLACEMENT  NOTE  Date:  03/03/2016  Patient Details  Name: Timothy Suarez MRN: [id number] Date of Birth: Jan 28, 1925  Clinical Social Work is seeking post-discharge placement for this patient at the [place] level of care (*CSW will initial, date and re-position this form in  chart as items are completed):  Yes   Patient/family provided with [place] Work Department's list of facilities offering this level of care within the geographic area requested by the patient (or if unable, by the patient's family).  Yes   Patient/family informed of their freedom to choose among providers that offer the needed level of care, that participate in Medicare, Medicaid or managed care program needed by the patient, have an available bed and are willing to accept the patient.  Yes   Patient/family informed of [place]'s ownership interest in [facility] and [facility], as well as of the fact that they are under no obligation to receive care at these facilities.  PASRR submitted to EDS on 03/02/16     PASRR number received on 03/02/16     Existing PASRR number confirmed on       FL2 transmitted to all facilities in geographic area requested by pt/family on 03/02/16     FL2 transmitted to all facilities within larger geographic area on       Patient informed that his/her managed care company has contracts with or will negotiate with certain facilities, including the following:        Yes   Patient/family informed of bed offers received.  Patient chooses bed at  [facility] )     Physician recommends and patient chooses bed at      Patient to be transferred to  [facility] ) on 03/03/16.  Patient to be transferred to facility by  [facility] )     Patient family notified on 03/03/16 of transfer.  Name of family member notified:   (Dtr, Debra )     PHYSICIAN Please sign FL2, Please prepare  priority discharge summary, including medications, Please prepare prescriptions     Additional Comment:    _______________________________________________ Rozell Searing, LCSW 03/03/2016, 10:27 AM

## 2016-03-03 NOTE — Discharge Summary (Signed)
  Physician Discharge Summary  Patient ID: Timothy Suarez MRN: [id number] DOB/AGE: 1924/09/11 82 y.o.  Admit date: 02/21/2016 Discharge date: 03/03/2016  Admission Diagnoses:Pharyngocutaneous fistula  Discharge Diagnoses:  Active Problems:   Pharyngocutaneous fistula   Protein-calorie malnutrition, severe   Discharged Condition: good  Hospital Course: Wound cleaned up nicely during stay. Gastrostomy was placed for continued parenteral nutrition.  Consults: none  Significant Diagnostic Studies: none  Treatments: Wound care and gastrostomy placement  Discharge Exam: Blood pressure 90/75, pulse (!) 112, temperature 97.7 F (36.5 C), temperature source Oral, resp. rate 16, height 6' (1.829 m), weight 85 kg (187 lb 4.8 oz), SpO2 99 %. PHYSICAL EXAM: Stoma is clean and healthy. Fistula is small. Clear mucus secretions. Packing in place.  Disposition: 01-Home or Self Care  Discharge Instructions    Discharge wound care:    Complete by:  As directed   Please change dressing 2-3 times daily and as needed. It is imperative to keep the wound packed so it does not leak mucus. Do not occlude the stoma. Contact Dr. Constance Holster at [phone] for specific instructions. I can send you a video on exactly how to change his dressing.   Increase activity slowly    Complete by:  As directed       Medication List    STOP taking these medications   HYDROcodone-acetaminophen 7.5-325 mg/15 ml solution Commonly known as:  HYCET     TAKE these medications   amLODipine 2.5 MG tablet Commonly known as:  NORVASC Place 1 tablet (2.5 mg total) into feeding tube daily. What changed:  See the new instructions.   atorvastatin 40 MG tablet Commonly known as:  LIPITOR TAKE 1 TABLET BY G tube DAILY AT 6PM FOR CHOLESTEROL What changed:  additional instructions   levothyroxine 50 MCG tablet Commonly known as:  SYNTHROID, LEVOTHROID Take 50 mcg by mouth at bedtime.   Vitamin D 2000 units Caps 1  capsule (2,000 Units total) by Gastrostomy Tube route daily at 8 pm. Take one capsule by mouth once daily What changed:  how much to take  how to take this  when to take this   zolpidem 5 MG tablet Commonly known as:  AMBIEN Take 1 tablet (5 mg total) by mouth at bedtime as needed. for sleep What changed:  how much to take  reasons to take this  additional instructions        Signed: [place] 03/03/2016, 8:58 AM

## 2016-03-03 NOTE — Clinical Social Work Note (Signed)
Family to complete paperwork at 11AM with facility admissions director.   Clinical Social Worker facilitated patient discharge including contacting patient family and facility to confirm patient discharge plans.  Clinical information faxed to facility and family agreeable with plan.  CSW arranged ambulance transport via [facility] to [facility].  RN to call report prior to discharge.  Clinical Social Worker will sign off for now as social work intervention is no longer needed. Please consult [place] again if new need arises.  Glendon Axe, MSW, [place] [phone] 03/03/2016 10:28 AM

## 2016-03-03 NOTE — Progress Notes (Addendum)
Patient discharged to [facility], attempted several times to call for report but was put on hold for the longest time. Left a number to receptionist to call me as soon as nurses is ready.Patient was transported via [facility] .

## 2016-03-04 ENCOUNTER — Ambulatory Visit: Payer: Medicare Other | Admitting: Rehabilitative and Restorative Service Providers"

## 2016-03-13 ENCOUNTER — Ambulatory Visit: Payer: Medicare Other | Admitting: Internal Medicine

## 2016-03-24 ENCOUNTER — Ambulatory Visit: Payer: Self-pay | Admitting: Otolaryngology

## 2016-03-25 ENCOUNTER — Encounter (HOSPITAL_COMMUNITY): Payer: Self-pay | Admitting: *Deleted

## 2016-03-25 ENCOUNTER — Ambulatory Visit: Payer: Self-pay | Admitting: Otolaryngology

## 2016-03-25 NOTE — H&P (Signed)
Timothy Suarez is an 82 y.o. male.   Chief Complaint: aphonia, salivary fistula HPI: History of salvage total laryngectomy, with residual salivary fistula.  Past Medical History:  Diagnosis Date  . A-fib ([place])   . Aortic valve insufficiency, acquired   . Arthritis   . BPH (benign prostatic hyperplasia)   . Cerebral ischemia   . Colon cancer [facility])     s/p partial colectomy  . Complication of anesthesia    "woke up w/confusion and hallucinations once after mitral valve OR"  . Coronary artery disease    50-70% LAD mitral valve repair 4+ yrs ago  . Depression   . Dyslipidemia   . Glaucoma   . Heart murmur   . Hernia   . High cholesterol   . History of echocardiogram    Echo 5/16:  Mild LVH, EF 50-55%, Gr 1 DD, septal HK, Ao sclerosis without stenosis, mild AI, MV repair ok with borderline mild MS (mean 4 mmHg), mild MR, mild LAE, mod RAE, mild TR, PASP 30 mmHg  . HTN (hypertension)    takes Amlodipine daily  . Hx of cardiovascular stress test    Lexiscan Myoview 5/16:  Apical thinning, EF not gated, no ischemia. Low Risk  . Hyperlipidemia   . Major depression ([place])   . Mild cognitive impairment   . MVP (mitral valve prolapse)    S/P Rt mini thoractomy for Mitral Valve repair  . Nocturia   . Paroxysmal atrial fibrillation (HCC)    S/P Maze procedure  . 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 Prostate cancer ([place])   . 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 Shortness of breath dyspnea    with exertion  . Sleep disturbance   . Thoracic aortic aneurysm ([place])   . Urinary frequency   . Urinary urgency     Past Surgical History:  Procedure Laterality Date  . CARDIAC CATHETERIZATION    . CATARACT EXTRACTION W/ INTRAOCULAR LENS  IMPLANT, BILATERAL Bilateral   . COLECTOMY  2004   Dr [name]  . DIRECT LARYNGOSCOPY N/A 01/15/2016   Procedure: DIRECT LARYNGOSCOPY WITH BIOPSY AND FROZEN SECTION;  Surgeon:  Izora Gala, MD;  Location: [facility];  Service: ENT;  Laterality: N/A;  . FlexHD patch repair of chest wall hernia.  01/28/2011   [name]  . JOINT REPLACEMENT    . LARYNGETOMY N/A 01/15/2016   Procedure:  TOTAL LARYNGECTOMY;  Surgeon: Izora Gala, MD;  [facility];  Service: ENT;  Laterality: N/A;  . MAZE  12/27/2008   left side lesion set  . MICROLARYNGOSCOPY Right 01/29/2015   Procedure: MICROLARYNGOSCOPY WITH BIOSPY OF RIGHT VOCAL CORD;  Surgeon: Izora Gala, MD;  Location: [facility];  Service: ENT;  Laterality: Right;  . MITRAL VALVE REPAIR  12/27/2008   complex valvuloplasty with 53mm Memo 3D annuloplasty ring via right minithoracotomy  . PROSTATE BIOPSY    . TEE WITHOUT CARDIOVERSION N/A 01/03/2013   Procedure: TRANSESOPHAGEAL ECHOCARDIOGRAM (TEE);  Surgeon: Sueanne Margarita, MD;  Location: [facility];  Service: Cardiovascular;  Laterality: N/A;  . TOTAL KNEE ARTHROPLASTY Right 2003  . TYMPANOPLASTY  1967   "? side"    Family History  Problem Relation Age of Onset  . Cancer Mother     lymphoma  . Cancer Father     pancreatic  . Heart attack Neg Hx   . Stroke Neg Hx    Social History:  reports that he has never smoked. He has never used smokeless tobacco. He reports that he drinks about 8.4 oz of alcohol per week . He reports that he does not use drugs.  Allergies:  Allergies  Allergen Reactions  . Citalopram Nausea Only  . Trazodone And Nefazodone Other (See Comments)    Dry mouth  . Zoloft [Sertraline Hcl] Other (See Comments)    dizzy     (Not in a hospital admission)  No results found for this or any previous visit (from the past 48 hour(s)). No results found.  ROS: otherwise negative  There were no vitals taken for this visit.  PHYSICAL EXAM: Overall appearance:  Healthy appearing, in no distress Head:  Normocephalic, atraumatic. Ears: External auditory canals are clear; tympanic membranes are intact and the middle ears are free of any effusion. Nose: External  nose is healthy in appearance. Internal nasal exam free of any lesions or obstruction. Oral Cavity/pharynx:  There are no mucosal lesions or masses identified. Neuro:  No identifiable neurologic deficits. Neck: No palpable neck masses. Stoma clear and healthy. Small salivary fistula along the left of midline of the neck incision.  Studies Reviewed: none    Assessment/Plan Salivary fistula, and aphonia. TEP and repair of fistula.  Adolfo Granieri 03/25/2016, 10:37 AM

## 2016-03-25 NOTE — Progress Notes (Signed)
Pt is a resident at [facility]. Spoke with Marianna Fuss, nurse for pt. She verified allergies, medications and medical history. She states pt is alert an oriented. He communicates with a board or pencil and paper. She states that he has not complained of any chest pain or sob recently. Pre-op instructions given to [facility]. Pt has a peg tube and instructed that when giving his Amlodipine in the AM to flush with the least amount, she states it would be 5 cc if needed. Also called and spoke with pt's daughter, [name]. She will be bringing pt to the hospital. Gave her time of arrival and where to come.

## 2016-03-26 ENCOUNTER — Encounter (HOSPITAL_COMMUNITY): Payer: Self-pay | Admitting: *Deleted

## 2016-03-26 ENCOUNTER — Inpatient Hospital Stay (HOSPITAL_BASED_OUTPATIENT_CLINIC_OR_DEPARTMENT_OTHER)
Admission: RE | Admit: 2016-03-26 | Discharge: 2016-03-27 | DRG: 134 | Disposition: A | Discharge status: Skilled Nursing Facility | Payer: Medicare Other | Source: Ambulatory Visit | Attending: Otolaryngology | Admitting: Otolaryngology

## 2016-03-26 ENCOUNTER — Inpatient Hospital Stay (HOSPITAL_COMMUNITY): Payer: Medicare Other | Admitting: Anesthesiology

## 2016-03-26 ENCOUNTER — Encounter (HOSPITAL_COMMUNITY)
Admission: RE | Disposition: A | Discharge status: Skilled Nursing Facility | Payer: Self-pay | Source: Ambulatory Visit | Attending: Otolaryngology

## 2016-03-26 DIAGNOSIS — I351 Nonrheumatic aortic (valve) insufficiency: Secondary | ICD-10-CM | POA: Diagnosis not present

## 2016-03-26 DIAGNOSIS — G3184 Mild cognitive impairment, so stated: Secondary | ICD-10-CM | POA: Diagnosis present

## 2016-03-26 DIAGNOSIS — I10 Essential (primary) hypertension: Secondary | ICD-10-CM | POA: Diagnosis present

## 2016-03-26 DIAGNOSIS — K114 Fistula of salivary gland: Principal | ICD-10-CM | POA: Diagnosis present

## 2016-03-26 DIAGNOSIS — N400 Enlarged prostate without lower urinary tract symptoms: Secondary | ICD-10-CM | POA: Diagnosis not present

## 2016-03-26 DIAGNOSIS — Z96651 Presence of right artificial knee joint: Secondary | ICD-10-CM | POA: Diagnosis present

## 2016-03-26 DIAGNOSIS — E785 Hyperlipidemia, unspecified: Secondary | ICD-10-CM | POA: Diagnosis present

## 2016-03-26 DIAGNOSIS — F329 Major depressive disorder, single episode, unspecified: Secondary | ICD-10-CM | POA: Diagnosis not present

## 2016-03-26 DIAGNOSIS — Z888 Allergy status to other drugs, medicaments and biological substances status: Secondary | ICD-10-CM

## 2016-03-26 DIAGNOSIS — H409 Unspecified glaucoma: Secondary | ICD-10-CM | POA: Diagnosis not present

## 2016-03-26 DIAGNOSIS — I4891 Unspecified atrial fibrillation: Secondary | ICD-10-CM | POA: Diagnosis not present

## 2016-03-26 DIAGNOSIS — Z9002 Acquired absence of larynx: Secondary | ICD-10-CM

## 2016-03-26 DIAGNOSIS — R491 Aphonia: Secondary | ICD-10-CM | POA: Diagnosis present

## 2016-03-26 HISTORY — PX: TRACHEOESOPHAGEAL FISTULA REPAIR: SHX2557

## 2016-03-26 HISTORY — PX: TRACHEAL ESOPHAGEAL PUNCTURE REPAIR: SHX5218

## 2016-03-26 LAB — CBC
HCT: 38.6 % — ABNORMAL LOW (ref 39.0–52.0)
Hemoglobin: 12.3 g/dL — ABNORMAL LOW (ref 13.0–17.0)
MCH: 30.8 pg (ref 26.0–34.0)
MCHC: 31.9 g/dL (ref 30.0–36.0)
MCV: 96.7 fL (ref 78.0–100.0)
PLATELETS: 188 10*3/uL (ref 150–400)
RBC: 3.99 MIL/uL — AB (ref 4.22–5.81)
RDW: 14 % (ref 11.5–15.5)
WBC: 8 10*3/uL (ref 4.0–10.5)

## 2016-03-26 LAB — BASIC METABOLIC PANEL
Anion gap: 8 (ref 5–15)
BUN: 28 mg/dL — AB (ref 6–20)
CHLORIDE: 108 mmol/L (ref 101–111)
CO2: 20 mmol/L — ABNORMAL LOW (ref 22–32)
CREATININE: 1.05 mg/dL (ref 0.61–1.24)
Calcium: 8.5 mg/dL — ABNORMAL LOW (ref 8.9–10.3)
Glucose, Bld: 105 mg/dL — ABNORMAL HIGH (ref 65–99)
POTASSIUM: 3.9 mmol/L (ref 3.5–5.1)
SODIUM: 136 mmol/L (ref 135–145)

## 2016-03-26 SURGERY — PUNCTURE, TRACHEOESOPHAGEAL
Anesthesia: General | Site: Neck

## 2016-03-26 MED ORDER — AMLODIPINE BESYLATE 2.5 MG PO TABS: 2.5000 mg | ORAL_TABLET | Freq: Every day | ORAL | Status: DC

## 2016-03-26 MED ORDER — ONDANSETRON HCL 4 MG/2ML IJ SOLN
INTRAMUSCULAR | Status: AC
  Filled 2016-03-26: qty 2

## 2016-03-26 MED ORDER — FENTANYL CITRATE (PF) 100 MCG/2ML IJ SOLN: 25.0000 ug | INTRAMUSCULAR | Status: DC | PRN

## 2016-03-26 MED ORDER — LACTATED RINGERS IV SOLN
INTRAVENOUS | Status: DC
  Administered 2016-03-26 (×2): via INTRAVENOUS

## 2016-03-26 MED ORDER — PHENYLEPHRINE 40 MCG/ML (10ML) SYRINGE FOR IV PUSH (FOR BLOOD PRESSURE SUPPORT)
PREFILLED_SYRINGE | INTRAVENOUS | Status: AC
  Filled 2016-03-26: qty 10

## 2016-03-26 MED ORDER — FENTANYL CITRATE (PF) 100 MCG/2ML IJ SOLN
INTRAMUSCULAR | Status: AC
  Filled 2016-03-26: qty 4

## 2016-03-26 MED ORDER — ROCURONIUM BROMIDE 100 MG/10ML IV SOLN
INTRAVENOUS | Status: DC | PRN
  Administered 2016-03-26: 30 mg via INTRAVENOUS

## 2016-03-26 MED ORDER — LIDOCAINE-EPINEPHRINE 1 %-1:100000 IJ SOLN
INTRAMUSCULAR | Status: AC
  Filled 2016-03-26: qty 1

## 2016-03-26 MED ORDER — PHENYLEPHRINE HCL 10 MG/ML IJ SOLN
INTRAMUSCULAR | Status: DC | PRN
Start: 2016-03-26 — End: 2016-03-26
  Administered 2016-03-26: 80 ug via INTRAVENOUS

## 2016-03-26 MED ORDER — PROPOFOL 10 MG/ML IV BOLUS
INTRAVENOUS | Status: DC | PRN
  Administered 2016-03-26: 140 mg via INTRAVENOUS

## 2016-03-26 MED ORDER — 0.9 % SODIUM CHLORIDE (POUR BTL) OPTIME
TOPICAL | Status: DC | PRN
  Administered 2016-03-26: 1000 mL

## 2016-03-26 MED ORDER — LIDOCAINE HCL (CARDIAC) 20 MG/ML IV SOLN
INTRAVENOUS | Status: DC | PRN
Start: 2016-03-26 — End: 2016-03-26
  Administered 2016-03-26: 15 mg via INTRAVENOUS

## 2016-03-26 MED ORDER — DEXAMETHASONE SODIUM PHOSPHATE 4 MG/ML IJ SOLN: 8.0000 mg | Freq: Once | INTRAMUSCULAR | Status: DC | PRN

## 2016-03-26 MED ORDER — ZOLPIDEM TARTRATE 5 MG PO TABS
2.5000 mg | ORAL_TABLET | Freq: Every evening | ORAL | Status: DC | PRN
  Administered 2016-03-26: 2.5 mg
  Filled 2016-03-26: qty 1

## 2016-03-26 MED ORDER — DEXTROSE-NACL 5-0.9 % IV SOLN
INTRAVENOUS | Status: DC
  Administered 2016-03-26: 14:00:00 via INTRAVENOUS

## 2016-03-26 MED ORDER — ATORVASTATIN CALCIUM 40 MG PO TABS
40.0000 mg | ORAL_TABLET | Freq: Every day | ORAL | Status: DC
  Administered 2016-03-26: 40 mg
  Filled 2016-03-26: qty 1

## 2016-03-26 MED ORDER — SUGAMMADEX SODIUM 200 MG/2ML IV SOLN
INTRAVENOUS | Status: AC
  Filled 2016-03-26: qty 2

## 2016-03-26 MED ORDER — ONDANSETRON HCL 4 MG/2ML IJ SOLN
INTRAMUSCULAR | Status: DC | PRN
  Administered 2016-03-26: 4 mg via INTRAVENOUS

## 2016-03-26 MED ORDER — SUGAMMADEX SODIUM 200 MG/2ML IV SOLN
INTRAVENOUS | Status: DC | PRN
  Administered 2016-03-26: 200 mg via INTRAVENOUS

## 2016-03-26 MED ORDER — PROPOFOL 10 MG/ML IV BOLUS
INTRAVENOUS | Status: AC
  Filled 2016-03-26: qty 20

## 2016-03-26 MED ORDER — LIDOCAINE 2% (20 MG/ML) 5 ML SYRINGE
INTRAMUSCULAR | Status: AC
  Filled 2016-03-26: qty 5

## 2016-03-26 MED ORDER — LEVOTHYROXINE SODIUM 50 MCG PO TABS
50.0000 ug | ORAL_TABLET | Freq: Every day | ORAL | Status: DC
Start: 2016-03-26 — End: 2016-03-27
  Administered 2016-03-26: 50 ug
  Filled 2016-03-26: qty 1

## 2016-03-26 MED ORDER — FENTANYL CITRATE (PF) 100 MCG/2ML IJ SOLN
INTRAMUSCULAR | Status: DC | PRN
  Administered 2016-03-26: 100 ug via INTRAVENOUS
  Administered 2016-03-26 (×2): 50 ug via INTRAVENOUS

## 2016-03-26 MED ORDER — VITAMIN D 1000 UNITS PO TABS
1000.0000 [IU] | ORAL_TABLET | Freq: Every day | ORAL | Status: DC
  Administered 2016-03-26: 1000 [IU] via ORAL
  Filled 2016-03-26: qty 1

## 2016-03-26 MED ORDER — JEVITY 1.2 CAL PO LIQD
1000.0000 mL | ORAL | Status: DC
  Administered 2016-03-26: 1000 mL
  Filled 2016-03-26 (×5): qty 1000

## 2016-03-26 MED ORDER — LIDOCAINE-EPINEPHRINE 1 %-1:100000 IJ SOLN
INTRAMUSCULAR | Status: DC | PRN
  Administered 2016-03-26: .09 mL via INTRADERMAL

## 2016-03-26 MED ORDER — MIDAZOLAM HCL 2 MG/2ML IJ SOLN
INTRAMUSCULAR | Status: AC
  Filled 2016-03-26: qty 2

## 2016-03-26 SURGICAL SUPPLY — 57 items
BENZOIN TINCTURE PRP APPL 2/3 (GAUZE/BANDAGES/DRESSINGS) ×2 IMPLANT
BLADE SURG 15 STRL LF DISP TIS (BLADE) ×1 IMPLANT
BLADE SURG 15 STRL SS (BLADE) ×1
CANISTER SUCTION 1200CC (MISCELLANEOUS) ×2 IMPLANT
CANISTER SUCTION 2500CC (MISCELLANEOUS) ×2 IMPLANT
CATH ROBINSON RED A/P 16FR (CATHETERS) ×2 IMPLANT
CLEANER TIP ELECTROSURG 2X2 (MISCELLANEOUS) ×2 IMPLANT
COVER MAYO STAND STRL (DRAPES) ×2 IMPLANT
COVER SURGICAL LIGHT HANDLE (MISCELLANEOUS) ×2 IMPLANT
COVER TABLE BACK 60X90 (DRAPES) ×2 IMPLANT
DECANTER SPIKE VIAL GLASS SM (MISCELLANEOUS) ×2 IMPLANT
DRAPE PROXIMA HALF (DRAPES) ×2 IMPLANT
ELECT COATED BLADE 2.86 ST (ELECTRODE) ×2 IMPLANT
ELECT REM PT RETURN 9FT ADLT (ELECTROSURGICAL) ×2
ELECTRODE REM PT RTRN 9FT ADLT (ELECTROSURGICAL) ×1 IMPLANT
GAUZE SPONGE 4X4 12PLY STRL (GAUZE/BANDAGES/DRESSINGS) IMPLANT
GAUZE SPONGE 4X4 16PLY XRAY LF (GAUZE/BANDAGES/DRESSINGS) ×2 IMPLANT
GLOVE BIOGEL PI IND STRL 6 (GLOVE) ×1 IMPLANT
GLOVE BIOGEL PI IND STRL 7.0 (GLOVE) ×1 IMPLANT
GLOVE BIOGEL PI INDICATOR 6 (GLOVE) ×1
GLOVE BIOGEL PI INDICATOR 7.0 (GLOVE) ×1
GLOVE ECLIPSE 7.5 STRL STRAW (GLOVE) ×2 IMPLANT
GLOVE SURG SS PI 6.5 STRL IVOR (GLOVE) ×2 IMPLANT
GOWN STRL REUS W/ TWL LRG LVL3 (GOWN DISPOSABLE) ×2 IMPLANT
GOWN STRL REUS W/ TWL XL LVL3 (GOWN DISPOSABLE) ×1 IMPLANT
GOWN STRL REUS W/TWL LRG LVL3 (GOWN DISPOSABLE) ×2
GOWN STRL REUS W/TWL XL LVL3 (GOWN DISPOSABLE) ×1
GUARD TEETH (MISCELLANEOUS) ×2 IMPLANT
H R LUBE JELLY XXX (MISCELLANEOUS) ×2 IMPLANT
KIT BASIN OR (CUSTOM PROCEDURE TRAY) ×2 IMPLANT
KIT ROOM TURNOVER OR (KITS) ×2 IMPLANT
NEEDLE 27GAX1X1/2 (NEEDLE) ×2 IMPLANT
NS IRRIG 1000ML POUR BTL (IV SOLUTION) ×2 IMPLANT
PACK EENT II TURBAN DRAPE (CUSTOM PROCEDURE TRAY) ×2 IMPLANT
PAD ARMBOARD 7.5X6 YLW CONV (MISCELLANEOUS) ×4 IMPLANT
PENCIL FOOT CONTROL (ELECTRODE) ×2 IMPLANT
PROSTHESIS PROVOX VEGA 20FRX10 (Orthopedic Implant) ×2 IMPLANT
SOL PREP POV-IOD 4OZ 10% (MISCELLANEOUS) ×2 IMPLANT
STRIP CLOSURE SKIN 1/2X4 (GAUZE/BANDAGES/DRESSINGS) ×2 IMPLANT
SUCTION FRAZIER HANDLE 10FR (MISCELLANEOUS) ×1
SUCTION TUBE FRAZIER 10FR DISP (MISCELLANEOUS) ×1 IMPLANT
SUT CHROMIC 2 0 SH (SUTURE) ×2 IMPLANT
SUT CHROMIC 3 0 SH 27 (SUTURE) ×2 IMPLANT
SUT ETHILON 3 0 PS 1 (SUTURE) ×2 IMPLANT
SUT SILK 0 TIES 10X30 (SUTURE) ×2 IMPLANT
SUT SILK 2 0 FS (SUTURE) ×2 IMPLANT
SUT SILK 4 0 TIE 10X30 (SUTURE) ×2 IMPLANT
SUT SILK 4 0 TIES 17X18 (SUTURE) ×2 IMPLANT
SUT VIC AB 3-0 PS2 18 (SUTURE) ×1
SUT VIC AB 3-0 PS2 18XBRD (SUTURE) ×1 IMPLANT
SYR 20CC LL (SYRINGE) ×2 IMPLANT
SYR CONTROL 10ML LL (SYRINGE) ×2 IMPLANT
TAPE PAPER 3X10 WHT MICROPORE (GAUZE/BANDAGES/DRESSINGS) IMPLANT
TOWEL OR 17X24 6PK STRL BLUE (TOWEL DISPOSABLE) ×2 IMPLANT
TUBE CONNECTING 12X1/4 (SUCTIONS) ×2 IMPLANT
TUBE CONNECTING 20X1/4 (TUBING) ×2 IMPLANT
WATER STERILE IRR 1000ML POUR (IV SOLUTION) ×2 IMPLANT

## 2016-03-26 NOTE — Plan of Care (Signed)
Problem: Fluid Volume: Goal: Ability to maintain a balanced intake and output will improve Outcome: Progressing Pt remains npo but is being fed via PEG tube

## 2016-03-26 NOTE — Anesthesia Preprocedure Evaluation (Addendum)
Anesthesia Evaluation  Patient identified by MRN, date of birth, ID band Patient awake    Reviewed: Allergy & Precautions, NPO status , Patient's Chart, lab work & pertinent test results  Airway Mallampati: Trach       Dental   Pulmonary    + rhonchi        Cardiovascular hypertension,  Rhythm:Regular Rate:Normal     Neuro/Psych    GI/Hepatic   Endo/Other    Renal/GU      Musculoskeletal   Abdominal   Peds  Hematology   Anesthesia Other Findings   Reproductive/Obstetrics                            Anesthesia Physical Anesthesia Plan  ASA: III  Anesthesia Plan: General   Post-op Pain Management:    Induction: Intravenous  Airway Management Planned: Tracheostomy  Additional Equipment:   Intra-op Plan:   Post-operative Plan:   Informed Consent: I have reviewed the patients History and Physical, chart, labs and discussed the procedure including the risks, benefits and alternatives for the proposed anesthesia with the patient or authorized representative who has indicated his/her understanding and acceptance.     Plan Discussed with: CRNA and Anesthesiologist  Anesthesia Plan Comments:         Anesthesia Quick Evaluation

## 2016-03-26 NOTE — Transfer of Care (Signed)
Immediate Anesthesia Transfer of Care Note  Patient: Timothy Suarez  Procedure(s) Performed: Procedure(s): TRACHEAL ESOPHAGEAL PUNCTURE (N/A) TRACHEO-ESOPHAGEAL   PUNCTURE,FISTULAR CLOSURE (N/A)  Patient Location: PACU  Anesthesia Type:General  Level of Consciousness: awake, oriented and patient cooperative  Airway & Oxygen Therapy: Patient Spontanous Breathing and patient connected to stoma oxygen  Post-op Assessment: Report given to RN and Post -op Vital signs reviewed and stable  Post vital signs: Reviewed  Last Vitals:  Vitals:   03/26/16 0924 03/26/16 1250  BP: 125/65 132/69  Pulse: 77 86  Resp: 20 20  Temp: 36.8 C 36.5 C    Last Pain:  Vitals:   03/26/16 0924  TempSrc: Oral      Patients Stated Pain Goal: 3 ([id number])  Complications: No apparent anesthesia complications

## 2016-03-26 NOTE — Op Note (Addendum)
OPERATIVE REPORT  DATE OF SURGERY: 03/26/2016  PATIENT:  Timothy Suarez,  82 y.o. male  PRE-OPERATIVE DIAGNOSIS:  PHARYNGOCUTANEOUS FISTULAR  POST-OPERATIVE DIAGNOSIS:  PHARYNGOCUTANEOUS FISTULAR  PROCEDURE:  Procedure(s): TRACHEAL ESOPHAGEAL PUNCTURE TRACHEO-ESOPHAGEAL   PUNCTURE,FISTULAR CLOSURE  SURGEON:  Beckie Salts, MD  ASSISTANTS: None  ANESTHESIA:   General   EBL:  15 ml  DRAINS: None  LOCAL MEDICATIONS USED:  None  SPECIMEN:  none  COUNTS:  Correct  PROCEDURE DETAILS: The patient was taken to the operating room and placed on the operating table in the supine position. Following induction of general anesthesia, the neck was prepped and draped in a standard fashion.  1. Closure of salivary fistula. The fistula was identified along the incision to the left of midline several centimeters above the stoma. There is only clear saliva draining. There is no signs of infection. All surrounding tissue looked very healthy. Electrocautery was used to incise the inner layer of epithelium to core out the fistula tract. 4-0 silk ties were used for a small vessel in the area. 3-0 Vicryl was used to reapproximate the mucosal edges of the pharynx. A secondary layer closure on top of that was performed as well with Vicryl. 3-0 chromic skin closure was accomplished. Dermabond was used on the skin. There is no obvious leak.  2. TEP. The Atos system was used. The esophagoscope was placed through the oral cavity using a maxillary tooth protector. Esophagoscope was passed down to the stoma and then rotated 180 causing the bevel tube face upward. The trocar was used through the upper part of the stoma and under direct visualization was entered into the esophagus. The guidewire was then placed. The dilator and introducer were attached to the guidewire. This was then withdrawn through the stoma. The esophagoscope was removed. The dilator was then introduced into the puncture site from the inside  and brought out through the stoma. The prosthesis was then pulled into position and flanges were holding it in excellent position. The introducer was detached. There is no obvious leak. The patient was then awakened extubated and transferred to recovery in stable condition.    PATIENT DISPOSITION:  To PACU, stable    Note: A 20 French 10 mm length prosthesis was placed.

## 2016-03-26 NOTE — Anesthesia Procedure Notes (Signed)
Procedure Name: Intubation Date/Time: 03/26/2016 11:28 AM Performed by: Jenne Campus Pre-anesthesia Checklist: Patient identified, Emergency Drugs available, Suction available and Patient being monitored Patient Re-evaluated:Patient Re-evaluated prior to inductionOxygen Delivery Method: Circle System Utilized Preoxygenation: Pre-oxygenation with 100% oxygen Intubation Type: IV induction and Tracheostomy Tube type: Reinforced Tube size: 7.0 mm Number of attempts: 1 Airway Equipment and Method: Oral airway Placement Confirmation: ETT inserted through vocal cords under direct vision,  positive ETCO2 and breath sounds checked- equal and bilateral Tube secured with: Tape Dental Injury: Teeth and Oropharynx as per pre-operative assessment  Comments: Smooth IV induction. 7.0 reinforced ETT to stoma. +ETCO2 bbs=.

## 2016-03-26 NOTE — Discharge Instructions (Signed)
Continue to use the feeding tube exclusively. Do not eat or drink anything. Call immediately if there is any swelling or any other problems.

## 2016-03-26 NOTE — Care Management Note (Signed)
Case Management Note  Patient Details  Name: Timothy Suarez MRN: [id number] Date of Birth: [birth year]  Subjective/Objective:    82 y.o. M admitted from [facility] 03/26/2016 for Tracheo-Esophageal Puncture Fistular Closure.   Plans are for him to return to [facility] 03/27/2016. Will alert CSW.                Action/Plan: Anticipate discharge [facility] 03/27/2016.Marland Kitchen No further CM needs but will be available should additional discharge needs arise.   Expected Discharge Date:                  Expected Discharge Plan:  [place]  In-House Referral:     Discharge planning Services  CM Consult  Post Acute Care Choice:  NA Choice offered to:  Patient, Adult Children  DME Arranged:  N/A DME Agency:     HH Arranged:    [place] Agency:     Status of Service:  In process, will continue to follow  If discussed at Long Length of Stay Meetings, dates discussed:    Additional Comments:  Delrae Sawyers, RN 03/26/2016, 4:17 PM

## 2016-03-26 NOTE — Progress Notes (Signed)
On adm to PACU  Hearing aides retrieved from family and placed in both ears. Able to communicate much better

## 2016-03-26 NOTE — H&P (View-Only) (Signed)
Timothy Suarez is an 82 y.o. male.   Chief Complaint: aphonia, salivary fistula HPI: History of salvage total laryngectomy, with residual salivary fistula.  Past Medical History:  Diagnosis Date  . A-fib ([place])   . Aortic valve insufficiency, acquired   . Arthritis   . BPH (benign prostatic hyperplasia)   . Cerebral ischemia   . Colon cancer [facility])     s/p partial colectomy  . Complication of anesthesia    "woke up w/confusion and hallucinations once after mitral valve OR"  . Coronary artery disease    50-70% LAD mitral valve repair 4+ yrs ago  . Depression   . Dyslipidemia   . Glaucoma   . Heart murmur   . Hernia   . High cholesterol   . History of echocardiogram    Echo 5/16:  Mild LVH, EF 50-55%, Gr 1 DD, septal HK, Ao sclerosis without stenosis, mild AI, MV repair ok with borderline mild MS (mean 4 mmHg), mild MR, mild LAE, mod RAE, mild TR, PASP 30 mmHg  . HTN (hypertension)    takes Amlodipine daily  . Hx of cardiovascular stress test    Lexiscan Myoview 5/16:  Apical thinning, EF not gated, no ischemia. Low Risk  . Hyperlipidemia   . Major depression ([place])   . Mild cognitive impairment   . MVP (mitral valve prolapse)    S/P Rt mini thoractomy for Mitral Valve repair  . Nocturia   . Paroxysmal atrial fibrillation ([facility])    S/P Maze procedure  . 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 Prostate cancer ([place])   . 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 Shortness of breath dyspnea    with exertion  . Sleep disturbance   . Thoracic aortic aneurysm ([place])   . Urinary frequency   . Urinary urgency     Past Surgical History:  Procedure Laterality Date  . CARDIAC CATHETERIZATION    . CATARACT EXTRACTION W/ INTRAOCULAR LENS  IMPLANT, BILATERAL Bilateral   . COLECTOMY  2004   Dr [name]  . DIRECT LARYNGOSCOPY N/A 01/15/2016   Procedure: DIRECT LARYNGOSCOPY WITH BIOPSY AND FROZEN SECTION;  Surgeon:  Izora Gala, MD;  Location: [place];  Service: ENT;  Laterality: N/A;  . FlexHD patch repair of chest wall hernia.  01/28/2011   [name]  . JOINT REPLACEMENT    . LARYNGETOMY N/A 01/15/2016   Procedure:  TOTAL LARYNGECTOMY;  Surgeon: Izora Gala, MD;  Location: [facility];  Service: ENT;  Laterality: N/A;  . MAZE  12/27/2008   left side lesion set  . MICROLARYNGOSCOPY Right 01/29/2015   Procedure: MICROLARYNGOSCOPY WITH BIOSPY OF RIGHT VOCAL CORD;  Surgeon: Izora Gala, MD;  Location: [place];  Service: ENT;  Laterality: Right;  . MITRAL VALVE REPAIR  12/27/2008   complex valvuloplasty with 18mm Memo 3D annuloplasty ring via right minithoracotomy  . PROSTATE BIOPSY    . TEE WITHOUT CARDIOVERSION N/A 01/03/2013   Procedure: TRANSESOPHAGEAL ECHOCARDIOGRAM (TEE);  Surgeon: Sueanne Margarita, MD;  Location: [place];  Service: Cardiovascular;  Laterality: N/A;  . TOTAL KNEE ARTHROPLASTY Right 2003  . TYMPANOPLASTY  1967   "? side"    Family History  Problem Relation Age of Onset  . Cancer Mother     lymphoma  . Cancer Father     pancreatic  . Heart attack Neg Hx   . Stroke Neg Hx    Social History:  reports that he has never smoked. He has never used smokeless tobacco. He reports that he drinks about 8.4 oz of alcohol per week . He reports that he does not use drugs.  Allergies:  Allergies  Allergen Reactions  . Citalopram Nausea Only  . Trazodone And Nefazodone Other (See Comments)    Dry mouth  . Zoloft [Sertraline Hcl] Other (See Comments)    dizzy     (Not in a hospital admission)  No results found for this or any previous visit (from the past 48 hour(s)). No results found.  ROS: otherwise negative  There were no vitals taken for this visit.  PHYSICAL EXAM: Overall appearance:  Healthy appearing, in no distress Head:  Normocephalic, atraumatic. Ears: External auditory canals are clear; tympanic membranes are intact and the middle ears are free of any effusion. Nose: External  nose is healthy in appearance. Internal nasal exam free of any lesions or obstruction. Oral Cavity/pharynx:  There are no mucosal lesions or masses identified. Neuro:  No identifiable neurologic deficits. Neck: No palpable neck masses. Stoma clear and healthy. Small salivary fistula along the left of midline of the neck incision.  Studies Reviewed: none    Assessment/Plan Salivary fistula, and aphonia. TEP and repair of fistula.  Timothy Suarez 03/25/2016, 10:37 AM

## 2016-03-26 NOTE — Interval H&P Note (Signed)
History and Physical Interval Note:  03/26/2016 11:05 AM  Timothy Suarez  has presented today for surgery, with the diagnosis of PHARYNGOCUTANEOUS FISTULAR  The various methods of treatment have been discussed with the patient and family. After consideration of risks, benefits and other options for treatment, the patient has consented to  Procedure(s): TRACHEAL ESOPHAGEAL PUNCTURE (N/A) TRACHEO-ESOPHAGEAL   PUNCTURE,FISTULAR CLOSURE (N/A) as a surgical intervention .  The patient's history has been reviewed, patient examined, no change in status, stable for surgery.  I have reviewed the patient's chart and labs.  Questions were answered to the patient's satisfaction.     Bronson Bressman

## 2016-03-26 NOTE — Anesthesia Postprocedure Evaluation (Signed)
Anesthesia Post Note  Patient: Timothy Suarez  Procedure(s) Performed: Procedure(s) (LRB): TRACHEAL ESOPHAGEAL PUNCTURE (N/A) TRACHEO-ESOPHAGEAL   PUNCTURE,FISTULAR CLOSURE (N/A)  Patient location during evaluation: PACU Anesthesia Type: General Level of consciousness: awake Pain management: pain level controlled Vital Signs Assessment: post-procedure vital signs reviewed and stable Respiratory status: spontaneous breathing, nonlabored ventilation and respiratory function stable Cardiovascular status: blood pressure returned to baseline Anesthetic complications: no    Last Vitals:  Vitals:   03/26/16 1400 03/26/16 1433  BP: 122/63 120/65  Pulse: (!) 59 60  Resp: 20 20  Temp:  36.9 C    Last Pain:  Vitals:   03/26/16 1433  TempSrc: Oral  PainSc:                  Arion Morgan COKER

## 2016-03-26 NOTE — Care Management Obs Status (Signed)
[place] NOTIFICATION   Patient Details  Name: Timothy Suarez MRN: [id number] Date of Birth: 1924/12/31   Medicare Observation Status Notification Given:  Yes    CrutchfieldAntony Haste, RN 03/26/2016, 4:16 PM

## 2016-03-27 ENCOUNTER — Encounter (HOSPITAL_COMMUNITY): Payer: Self-pay | Admitting: Otolaryngology

## 2016-03-27 NOTE — Discharge Summary (Signed)
  Physician Discharge Summary  Patient ID: Timothy Suarez MRN: [id number] DOB/AGE: 11/09/1924 82 y.o.  Admit date: 03/26/2016 Discharge date: 03/27/2016  Admission Diagnoses:aphonia, salivary fistula  Discharge Diagnoses:  Active Problems:   S/P laryngectomy   H/O laryngectomy   Discharged Condition: good  Hospital Course: no complications.  Consults: none  Significant Diagnostic Studies: none  Treatments: surgery: fistula closure, TEP  Discharge Exam: Blood pressure 134/69, pulse 92, temperature 98.3 F (36.8 C), temperature source Oral, resp. rate 19, height 6\' 1"  (1.854 m), weight 87.1 kg (192 lb), SpO2 95 %. PHYSICAL EXAM: Surgical sites look excellent. No drainage. TEP functions - he was instructed on its use and was able to phonate a little.  Disposition: 03-Skilled Nursing Facility  Discharge Instructions    Increase activity slowly    Complete by:  As directed   Increase activity slowly    Complete by:  As directed       Medication List    TAKE these medications   amLODipine 2.5 MG tablet Commonly known as:  NORVASC Place 1 tablet (2.5 mg total) into feeding tube daily.   atorvastatin 40 MG tablet Commonly known as:  LIPITOR TAKE 1 TABLET BY G tube DAILY AT 6PM FOR CHOLESTEROL   levothyroxine 50 MCG tablet Commonly known as:  SYNTHROID, LEVOTHROID Take 50 mcg by mouth at bedtime.   Vitamin D 2000 units Caps 1 capsule (2,000 Units total) by Gastrostomy Tube route daily at 8 pm. Take one capsule by mouth once daily   zolpidem 5 MG tablet Commonly known as:  AMBIEN Take 1 tablet (5 mg total) by mouth at bedtime as needed. for sleep What changed:  how much to take  reasons to take this  additional instructions      Follow-up Information    Tongela Encinas, MD. Schedule an appointment as soon as possible for a visit on 03/30/2016.   Specialty:  Otolaryngology Contact information: [address] [place]  [address] [phone]           Signed: Izora Gala 03/27/2016, 8:35 AM

## 2016-03-27 NOTE — Discharge Planning (Signed)
AVS given to pt's daughter who is transporting back to facility. They verbalized understanding. Out per w/c at 1130.

## 2016-04-07 ENCOUNTER — Ambulatory Visit: Payer: Self-pay | Admitting: Otolaryngology

## 2016-04-07 NOTE — H&P (Signed)
Timothy Suarez is an 82 y.o. male.   Chief Complaint: Pharyngeal fistula HPI: Total laryngectomy post XRT with wound breakdown and fistula.  Past Medical History:  Diagnosis Date  . A-fib ([place])   . Aortic valve insufficiency, acquired   . Arthritis   . BPH (benign prostatic hyperplasia)   . Cerebral ischemia   . Colon cancer [facility])     s/p partial colectomy  . Complication of anesthesia    "woke up w/confusion and hallucinations once after mitral valve OR"  . Coronary artery disease    50-70% LAD mitral valve repair 4+ yrs ago  . Depression   . Dyslipidemia   . Glaucoma   . Heart murmur   . Hernia   . High cholesterol   . History of echocardiogram    Echo 5/16:  Mild LVH, EF 50-55%, Gr 1 DD, septal HK, Ao sclerosis without stenosis, mild AI, MV repair ok with borderline mild MS (mean 4 mmHg), mild MR, mild LAE, mod RAE, mild TR, PASP 30 mmHg  . HTN (hypertension)    takes Amlodipine daily  . Hx of cardiovascular stress test    Lexiscan Myoview 5/16:  Apical thinning, EF not gated, no ischemia. Low Risk  . Hyperlipidemia   . Major depression ([place])   . Mild cognitive impairment   . MVP (mitral valve prolapse)    S/P Rt mini thoractomy for Mitral Valve repair  . Nocturia   . Paroxysmal atrial fibrillation (HCC)    S/P Maze procedure  . 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 Prostate cancer ([place])   . 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 Shortness of breath dyspnea    with exertion  . Sleep disturbance   . Thoracic aortic aneurysm ([place])   . Urinary frequency   . Urinary urgency     Past Surgical History:  Procedure Laterality Date  . CARDIAC CATHETERIZATION    . CATARACT EXTRACTION W/ INTRAOCULAR LENS  IMPLANT, BILATERAL Bilateral   . COLECTOMY  2004   Dr [name]  . DIRECT LARYNGOSCOPY N/A 01/15/2016   Procedure: DIRECT LARYNGOSCOPY WITH BIOPSY AND FROZEN SECTION;  Surgeon: Izora Gala, MD;   Location: [place];  Service: ENT;  Laterality: N/A;  . FlexHD patch repair of chest wall hernia.  01/28/2011   [name]  . JOINT REPLACEMENT    . LARYNGETOMY N/A 01/15/2016   Procedure:  TOTAL LARYNGECTOMY;  Surgeon: Izora Gala, MD;  Location: [facility] OR;  Service: ENT;  Laterality: N/A;  . MAZE  12/27/2008   left side lesion set  . MICROLARYNGOSCOPY Right 01/29/2015   Procedure: MICROLARYNGOSCOPY WITH BIOSPY OF RIGHT VOCAL CORD;  Surgeon: Izora Gala, MD;  Location: [place];  Service: ENT;  Laterality: Right;  . MITRAL VALVE REPAIR  12/27/2008   complex valvuloplasty with 34mm Memo 3D annuloplasty ring via right minithoracotomy  . PROSTATE BIOPSY    . TEE WITHOUT CARDIOVERSION N/A 01/03/2013   Procedure: TRANSESOPHAGEAL ECHOCARDIOGRAM (TEE);  Surgeon: Sueanne Margarita, MD;  Location: [place];  Service: Cardiovascular;  Laterality: N/A;  . TOTAL KNEE ARTHROPLASTY Right 2003  . TRACHEAL ESOPHAGEAL PUNCTURE REPAIR N/A 03/26/2016   Procedure: TRACHEAL ESOPHAGEAL PUNCTURE;  Surgeon: Izora Gala, MD;  Location: [place];  Service: ENT;  Laterality: N/A;  . TRACHEOESOPHAGEAL FISTULA REPAIR N/A 03/26/2016   Procedure: TRACHEO-ESOPHAGEAL   PUNCTURE,FISTULAR CLOSURE;  Surgeon: Izora Gala, MD;  Location: [place];  Service: ENT;  Laterality: N/A;  . TYMPANOPLASTY  1967   "? side"    Family History  Problem Relation Age of Onset  . Cancer Mother     lymphoma  . Cancer Father     pancreatic  . Heart attack Neg Hx   . Stroke Neg Hx    Social History:  reports that he has never smoked. He has never used smokeless tobacco. He reports that he drinks about 8.4 oz of alcohol per week . He reports that he does not use drugs.  Allergies:  Allergies  Allergen Reactions  . Citalopram Nausea Only  . Trazodone And Nefazodone Other (See Comments)    Dry mouth  . Zoloft [Sertraline Hcl] Other (See Comments)    dizzy     (Not in a hospital admission) There were no vitals taken for this visit.  PHYSICAL  EXAM: Overall appearance:  Healthy appearing, in no distress Head:  Normocephalic, atraumatic. Ears: External ears healthy. Nose: External nose is healthy in appearance. Internal nasal exam free of any lesions or obstruction. Oral Cavity/pharynx:  There are no mucosal lesions or masses identified. Neuro:  No identifiable neurologic deficits. Neck: No palpable neck masses. Stoma healthy. Fistula site with clear mucous drainage.  Studies Reviewed: none    Assessment/Plan Attempt repair of fistula with muscle flap.  Deirdra Heumann 04/07/2016, 9:09 AM

## 2016-04-08 ENCOUNTER — Telehealth: Payer: Self-pay | Admitting: *Deleted

## 2016-04-08 ENCOUNTER — Encounter (HOSPITAL_COMMUNITY): Payer: Self-pay | Admitting: *Deleted

## 2016-04-08 NOTE — Telephone Encounter (Signed)
I have not seen him since he had his laryngectomy. Can we schedule him an appt, please?   PT is fine, but I think he will need an appt w/in 30 days.

## 2016-04-08 NOTE — Progress Notes (Signed)
Pt SDW-Pre-op call completed by pt daughter, [name]). Daughter denies that pt C/O SOB and chest pain. Daughter denies that pt is under the care of a cardiologist. Daughter not sure if pt had a cardiac cath, daughter stated , " if he had it -it was a while ago." Daughter made aware to stop administering vitamins, fish oil and herbal medications. Do not give pt any NSAIDs ie: Ibuprofen, Advil, Naproxen, BC and Goody Powder. Daughter verbalized understanding of all pre-op instructions.

## 2016-04-08 NOTE — Telephone Encounter (Signed)
LMOM to return call.

## 2016-04-08 NOTE — Telephone Encounter (Signed)
Timothy Suarez with [facility] with [facility] called requesting verbal orders for therapy evaluation. Please Advise.

## 2016-04-09 ENCOUNTER — Encounter (HOSPITAL_COMMUNITY): Payer: Self-pay | Admitting: Anesthesiology

## 2016-04-09 ENCOUNTER — Observation Stay (HOSPITAL_COMMUNITY)
Admission: RE | Admit: 2016-04-09 | Discharge: 2016-04-10 | Disposition: A | Discharge status: Home / Self Care | Payer: Medicare Other | Source: Ambulatory Visit | Attending: Otolaryngology | Admitting: Otolaryngology

## 2016-04-09 ENCOUNTER — Inpatient Hospital Stay (HOSPITAL_COMMUNITY): Payer: Medicare Other | Admitting: Anesthesiology

## 2016-04-09 ENCOUNTER — Encounter (HOSPITAL_COMMUNITY)
Admission: RE | Disposition: A | Discharge status: Home / Self Care | Payer: Self-pay | Source: Ambulatory Visit | Attending: Otolaryngology

## 2016-04-09 DIAGNOSIS — I2510 Atherosclerotic heart disease of native coronary artery without angina pectoris: Secondary | ICD-10-CM | POA: Insufficient documentation

## 2016-04-09 DIAGNOSIS — I10 Essential (primary) hypertension: Secondary | ICD-10-CM | POA: Diagnosis not present

## 2016-04-09 DIAGNOSIS — J392 Other diseases of pharynx: Principal | ICD-10-CM | POA: Diagnosis present

## 2016-04-09 DIAGNOSIS — Z951 Presence of aortocoronary bypass graft: Secondary | ICD-10-CM | POA: Insufficient documentation

## 2016-04-09 DIAGNOSIS — G479 Sleep disorder, unspecified: Secondary | ICD-10-CM | POA: Diagnosis not present

## 2016-04-09 DIAGNOSIS — E7800 Pure hypercholesterolemia, unspecified: Secondary | ICD-10-CM | POA: Insufficient documentation

## 2016-04-09 DIAGNOSIS — Z96651 Presence of right artificial knee joint: Secondary | ICD-10-CM | POA: Insufficient documentation

## 2016-04-09 DIAGNOSIS — Z85038 Personal history of other malignant neoplasm of large intestine: Secondary | ICD-10-CM | POA: Diagnosis not present

## 2016-04-09 DIAGNOSIS — Z9049 Acquired absence of other specified parts of digestive tract: Secondary | ICD-10-CM | POA: Insufficient documentation

## 2016-04-09 DIAGNOSIS — E785 Hyperlipidemia, unspecified: Secondary | ICD-10-CM | POA: Insufficient documentation

## 2016-04-09 DIAGNOSIS — Z8546 Personal history of malignant neoplasm of prostate: Secondary | ICD-10-CM | POA: Diagnosis not present

## 2016-04-09 DIAGNOSIS — Z8521 Personal history of malignant neoplasm of larynx: Secondary | ICD-10-CM | POA: Insufficient documentation

## 2016-04-09 HISTORY — PX: TRACHEOESOPHAGEAL FISTULA REPAIR: SHX2557

## 2016-04-09 LAB — BASIC METABOLIC PANEL
ANION GAP: 9 (ref 5–15)
BUN: 32 mg/dL — AB (ref 6–20)
CALCIUM: 8.9 mg/dL (ref 8.9–10.3)
CO2: 20 mmol/L — AB (ref 22–32)
Chloride: 112 mmol/L — ABNORMAL HIGH (ref 101–111)
Creatinine, Ser: 1 mg/dL (ref 0.61–1.24)
GFR calc Af Amer: >60 mL/min (ref >60)
GFR calc non Af Amer: >60 mL/min (ref >60)
GLUCOSE: 98 mg/dL (ref 65–99)
Potassium: 4 mmol/L (ref 3.5–5.1)
Sodium: 141 mmol/L (ref 135–145)

## 2016-04-09 LAB — CBC
HEMATOCRIT: 41.7 % (ref 39.0–52.0)
Hemoglobin: 13 g/dL (ref 13.0–17.0)
MCH: 30.4 pg (ref 26.0–34.0)
MCHC: 31.2 g/dL (ref 30.0–36.0)
MCV: 97.4 fL (ref 78.0–100.0)
Platelets: 207 10*3/uL (ref 150–400)
RBC: 4.28 MIL/uL (ref 4.22–5.81)
RDW: 14.1 % (ref 11.5–15.5)
WBC: 8.4 10*3/uL (ref 4.0–10.5)

## 2016-04-09 SURGERY — TRACHEAL ESOPHAGEAL FISTULA REPAIR
Anesthesia: General | Site: Neck

## 2016-04-09 MED ORDER — HYDROCODONE-ACETAMINOPHEN 7.5-325 MG/15ML PO SOLN: 10.0000 mL | ORAL | Status: DC | PRN

## 2016-04-09 MED ORDER — LACTATED RINGERS IV SOLN
INTRAVENOUS | Status: DC | PRN
  Administered 2016-04-09 (×2): via INTRAVENOUS

## 2016-04-09 MED ORDER — FENTANYL CITRATE (PF) 100 MCG/2ML IJ SOLN
INTRAMUSCULAR | Status: DC | PRN
  Administered 2016-04-09: 50 ug via INTRAVENOUS
  Administered 2016-04-09: 100 ug via INTRAVENOUS

## 2016-04-09 MED ORDER — CLINDAMYCIN PHOSPHATE 900 MG/50ML IV SOLN
900.0000 mg | INTRAVENOUS | Status: AC
  Administered 2016-04-09: 900 mg via INTRAVENOUS

## 2016-04-09 MED ORDER — JEVITY 1.2 CAL PO LIQD
1000.0000 mL | ORAL | Status: DC
  Administered 2016-04-09 – 2016-04-10 (×2): 1000 mL
  Filled 2016-04-09: qty 1000
  Filled 2016-04-09 (×2): qty 237
  Filled 2016-04-09 (×2): qty 1000

## 2016-04-09 MED ORDER — LIDOCAINE-EPINEPHRINE 1 %-1:100000 IJ SOLN
INTRAMUSCULAR | Status: AC
  Filled 2016-04-09: qty 1

## 2016-04-09 MED ORDER — ONDANSETRON HCL 4 MG/2ML IJ SOLN
INTRAMUSCULAR | Status: DC | PRN
  Administered 2016-04-09: 4 mg via INTRAVENOUS

## 2016-04-09 MED ORDER — IBUPROFEN 100 MG/5ML PO SUSP
400.0000 mg | Freq: Four times a day (QID) | ORAL | Status: DC | PRN
  Administered 2016-04-09: 400 mg
  Filled 2016-04-09 (×2): qty 20

## 2016-04-09 MED ORDER — FENTANYL CITRATE (PF) 100 MCG/2ML IJ SOLN
INTRAMUSCULAR | Status: AC
  Administered 2016-04-09: 25 ug via INTRAVENOUS
  Filled 2016-04-09: qty 2

## 2016-04-09 MED ORDER — ROCURONIUM BROMIDE 100 MG/10ML IV SOLN
INTRAVENOUS | Status: DC | PRN
  Administered 2016-04-09: 40 mg via INTRAVENOUS

## 2016-04-09 MED ORDER — CLINDAMYCIN PHOSPHATE 600 MG/50ML IV SOLN
600.0000 mg | Freq: Four times a day (QID) | INTRAVENOUS | Status: AC
  Administered 2016-04-09 – 2016-04-10 (×3): 600 mg via INTRAVENOUS
  Filled 2016-04-09 (×3): qty 50

## 2016-04-09 MED ORDER — VITAMIN D 50 MCG (2000 UT) PO CAPS: 1.0000 | ORAL_CAPSULE | Freq: Every day | ORAL | Status: DC

## 2016-04-09 MED ORDER — FENTANYL CITRATE (PF) 100 MCG/2ML IJ SOLN
25.0000 ug | INTRAMUSCULAR | Status: DC | PRN
  Administered 2016-04-09 (×2): 25 ug via INTRAVENOUS

## 2016-04-09 MED ORDER — FENTANYL CITRATE (PF) 100 MCG/2ML IJ SOLN
INTRAMUSCULAR | Status: AC
  Filled 2016-04-09: qty 4

## 2016-04-09 MED ORDER — PROPOFOL 10 MG/ML IV BOLUS
INTRAVENOUS | Status: AC
  Filled 2016-04-09: qty 20

## 2016-04-09 MED ORDER — SUGAMMADEX SODIUM 200 MG/2ML IV SOLN
INTRAVENOUS | Status: DC | PRN
  Administered 2016-04-09: 174.2 mg via INTRAVENOUS

## 2016-04-09 MED ORDER — ZOLPIDEM TARTRATE 5 MG PO TABS
5.0000 mg | ORAL_TABLET | Freq: Every evening | ORAL | Status: DC | PRN
  Administered 2016-04-09: 5 mg via ORAL
  Filled 2016-04-09: qty 1

## 2016-04-09 MED ORDER — VITAMIN D 1000 UNITS PO TABS
2000.0000 [IU] | ORAL_TABLET | Freq: Every day | ORAL | Status: DC
  Administered 2016-04-09: 2000 [IU] via ORAL
  Filled 2016-04-09: qty 2

## 2016-04-09 MED ORDER — LEVOTHYROXINE SODIUM 50 MCG PO TABS
50.0000 ug | ORAL_TABLET | Freq: Every day | ORAL | Status: DC
  Administered 2016-04-10: 50 ug
  Filled 2016-04-09: qty 1

## 2016-04-09 MED ORDER — ATORVASTATIN CALCIUM 40 MG PO TABS
40.0000 mg | ORAL_TABLET | Freq: Every day | ORAL | Status: DC
  Administered 2016-04-09: 40 mg
  Filled 2016-04-09: qty 1

## 2016-04-09 MED ORDER — PHENYLEPHRINE HCL 10 MG/ML IJ SOLN
INTRAMUSCULAR | Status: DC | PRN
  Administered 2016-04-09: 40 ug via INTRAVENOUS
  Administered 2016-04-09: 80 ug via INTRAVENOUS

## 2016-04-09 MED ORDER — AMLODIPINE BESYLATE 2.5 MG PO TABS
2.5000 mg | ORAL_TABLET | Freq: Every day | ORAL | Status: DC
  Administered 2016-04-09 – 2016-04-10 (×2): 2.5 mg
  Filled 2016-04-09 (×2): qty 1

## 2016-04-09 MED ORDER — 0.9 % SODIUM CHLORIDE (POUR BTL) OPTIME
TOPICAL | Status: DC | PRN
  Administered 2016-04-09: 1000 mL

## 2016-04-09 MED ORDER — DEXTROSE-NACL 5-0.9 % IV SOLN
INTRAVENOUS | Status: DC
  Administered 2016-04-09 – 2016-04-10 (×2): via INTRAVENOUS

## 2016-04-09 MED ORDER — PROPOFOL 10 MG/ML IV BOLUS
INTRAVENOUS | Status: DC | PRN
  Administered 2016-04-09: 80 mg via INTRAVENOUS

## 2016-04-09 MED ORDER — EPHEDRINE SULFATE 50 MG/ML IJ SOLN
INTRAMUSCULAR | Status: DC | PRN
  Administered 2016-04-09: 5 mg via INTRAVENOUS
  Administered 2016-04-09: 10 mg via INTRAVENOUS

## 2016-04-09 MED ORDER — CLINDAMYCIN PHOSPHATE 900 MG/50ML IV SOLN
INTRAVENOUS | Status: AC
  Filled 2016-04-09: qty 50

## 2016-04-09 SURGICAL SUPPLY — 47 items
BLADE SURG 15 STRL LF DISP TIS (BLADE) IMPLANT
BLADE SURG 15 STRL SS (BLADE)
CANISTER SUCTION 2500CC (MISCELLANEOUS) ×3 IMPLANT
CLEANER TIP ELECTROSURG 2X2 (MISCELLANEOUS) ×3 IMPLANT
COVER SURGICAL LIGHT HANDLE (MISCELLANEOUS) ×3 IMPLANT
DECANTER SPIKE VIAL GLASS SM (MISCELLANEOUS) ×3 IMPLANT
DERMABOND ADVANCED (GAUZE/BANDAGES/DRESSINGS) ×2
DERMABOND ADVANCED .7 DNX12 (GAUZE/BANDAGES/DRESSINGS) ×1 IMPLANT
DRAIN HEMOVAC 7FR (DRAIN) ×3 IMPLANT
DRAIN PENROSE 1/4X12 LTX STRL (WOUND CARE) ×3 IMPLANT
DRAPE PROXIMA HALF (DRAPES) IMPLANT
ELECT COATED BLADE 2.86 ST (ELECTRODE) ×3 IMPLANT
ELECT REM PT RETURN 9FT ADLT (ELECTROSURGICAL) ×3
ELECTRODE REM PT RTRN 9FT ADLT (ELECTROSURGICAL) ×1 IMPLANT
EVACUATOR SILICONE 100CC (DRAIN) ×3 IMPLANT
GAUZE SPONGE 4X4 16PLY XRAY LF (GAUZE/BANDAGES/DRESSINGS) ×3 IMPLANT
GLOVE BIOGEL PI IND STRL 7.0 (GLOVE) ×1 IMPLANT
GLOVE BIOGEL PI INDICATOR 7.0 (GLOVE) ×2
GLOVE ECLIPSE 6.5 STRL STRAW (GLOVE) ×3 IMPLANT
GLOVE ECLIPSE 7.5 STRL STRAW (GLOVE) ×3 IMPLANT
GLOVE ECLIPSE 8.0 STRL XLNG CF (GLOVE) ×3 IMPLANT
GLOVE SURG SS PI 7.0 STRL IVOR (GLOVE) ×3 IMPLANT
GOWN STRL REUS W/ TWL LRG LVL3 (GOWN DISPOSABLE) ×3 IMPLANT
GOWN STRL REUS W/TWL LRG LVL3 (GOWN DISPOSABLE) ×6
KIT BASIN OR (CUSTOM PROCEDURE TRAY) ×3 IMPLANT
KIT ROOM TURNOVER OR (KITS) ×3 IMPLANT
NS IRRIG 1000ML POUR BTL (IV SOLUTION) ×3 IMPLANT
PACK EENT II TURBAN DRAPE (CUSTOM PROCEDURE TRAY) ×3 IMPLANT
PAD ARMBOARD 7.5X6 YLW CONV (MISCELLANEOUS) ×6 IMPLANT
PENCIL FOOT CONTROL (ELECTRODE) ×3 IMPLANT
SOL PREP POV-IOD 4OZ 10% (MISCELLANEOUS) ×3 IMPLANT
SUT CHROMIC 2 0 SH (SUTURE) IMPLANT
SUT CHROMIC 3 0 SH 27 (SUTURE) ×3 IMPLANT
SUT ETHILON 3 0 PS 1 (SUTURE) IMPLANT
SUT ETHILON 5 0 PS 2 18 (SUTURE) ×3 IMPLANT
SUT SILK 4 0 TIES 17X18 (SUTURE) ×3 IMPLANT
SUT VIC AB 3-0 PS2 18 (SUTURE) ×2
SUT VIC AB 3-0 PS2 18XBRD (SUTURE) ×1 IMPLANT
SUT VIC AB 3-0 SH 18 (SUTURE) ×3 IMPLANT
SYR 20CC LL (SYRINGE) ×3 IMPLANT
SYR BULB 3OZ (MISCELLANEOUS) ×3 IMPLANT
SYR BULB IRRIGATION 50ML (SYRINGE) ×3 IMPLANT
TOWEL OR 17X24 6PK STRL BLUE (TOWEL DISPOSABLE) ×3 IMPLANT
TUBE CONNECTING 12'X1/4 (SUCTIONS) ×1
TUBE CONNECTING 12X1/4 (SUCTIONS) ×2 IMPLANT
WATER STERILE IRR 1000ML POUR (IV SOLUTION) ×3 IMPLANT
YANKAUER SUCT BULB TIP NO VENT (SUCTIONS) ×3 IMPLANT

## 2016-04-09 NOTE — H&P (View-Only) (Signed)
Timothy Suarez is an 82 y.o. male.   Chief Complaint: Pharyngeal fistula HPI: Total laryngectomy post XRT with wound breakdown and fistula.  Past Medical History:  Diagnosis Date  . A-fib ([place])   . Aortic valve insufficiency, acquired   . Arthritis   . BPH (benign prostatic hyperplasia)   . Cerebral ischemia   . Colon cancer [facility])     s/p partial colectomy  . Complication of anesthesia    "woke up w/confusion and hallucinations once after mitral valve OR"  . Coronary artery disease    50-70% LAD mitral valve repair 4+ yrs ago  . Depression   . Dyslipidemia   . Glaucoma   . Heart murmur   . Hernia   . High cholesterol   . History of echocardiogram    Echo 5/16:  Mild LVH, EF 50-55%, Gr 1 DD, septal HK, Ao sclerosis without stenosis, mild AI, MV repair ok with borderline mild MS (mean 4 mmHg), mild MR, mild LAE, mod RAE, mild TR, PASP 30 mmHg  . HTN (hypertension)    takes Amlodipine daily  . Hx of cardiovascular stress test    Lexiscan Myoview 5/16:  Apical thinning, EF not gated, no ischemia. Low Risk  . Hyperlipidemia   . Major depression ([place])   . Mild cognitive impairment   . MVP (mitral valve prolapse)    S/P Rt mini thoractomy for Mitral Valve repair  . Nocturia   . Paroxysmal atrial fibrillation (HCC)    S/P Maze procedure  . 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 Prostate cancer ([place])   . 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 Shortness of breath dyspnea    with exertion  . Sleep disturbance   . Thoracic aortic aneurysm ([place])   . Urinary frequency   . Urinary urgency     Past Surgical History:  Procedure Laterality Date  . CARDIAC CATHETERIZATION    . CATARACT EXTRACTION W/ INTRAOCULAR LENS  IMPLANT, BILATERAL Bilateral   . COLECTOMY  2004   Dr [name]  . DIRECT LARYNGOSCOPY N/A 01/15/2016   Procedure: DIRECT LARYNGOSCOPY WITH BIOPSY AND FROZEN SECTION;  Surgeon: Izora Gala, MD;   Location: [place];  Service: ENT;  Laterality: N/A;  . FlexHD patch repair of chest wall hernia.  01/28/2011   [name]  . JOINT REPLACEMENT    . LARYNGETOMY N/A 01/15/2016   Procedure:  TOTAL LARYNGECTOMY;  Surgeon: Izora Gala, MD;  Location: [facility] OR;  Service: ENT;  Laterality: N/A;  . MAZE  12/27/2008   left side lesion set  . MICROLARYNGOSCOPY Right 01/29/2015   Procedure: MICROLARYNGOSCOPY WITH BIOSPY OF RIGHT VOCAL CORD;  Surgeon: Izora Gala, MD;  Location: [place];  Service: ENT;  Laterality: Right;  . MITRAL VALVE REPAIR  12/27/2008   complex valvuloplasty with 27mm Memo 3D annuloplasty ring via right minithoracotomy  . PROSTATE BIOPSY    . TEE WITHOUT CARDIOVERSION N/A 01/03/2013   Procedure: TRANSESOPHAGEAL ECHOCARDIOGRAM (TEE);  Surgeon: Sueanne Margarita, MD;  Location: [place];  Service: Cardiovascular;  Laterality: N/A;  . TOTAL KNEE ARTHROPLASTY Right 2003  . TRACHEAL ESOPHAGEAL PUNCTURE REPAIR N/A 03/26/2016   Procedure: TRACHEAL ESOPHAGEAL PUNCTURE;  Surgeon: Izora Gala, MD;  Location: [place];  Service: ENT;  Laterality: N/A;  . TRACHEOESOPHAGEAL FISTULA REPAIR N/A 03/26/2016   Procedure: TRACHEO-ESOPHAGEAL   PUNCTURE,FISTULAR CLOSURE;  Surgeon: Izora Gala, MD;  Location: [place];  Service: ENT;  Laterality: N/A;  . TYMPANOPLASTY  1967   "? side"    Family History  Problem Relation Age of Onset  . Cancer Mother     lymphoma  . Cancer Father     pancreatic  . Heart attack Neg Hx   . Stroke Neg Hx    Social History:  reports that he has never smoked. He has never used smokeless tobacco. He reports that he drinks about 8.4 oz of alcohol per week . He reports that he does not use drugs.  Allergies:  Allergies  Allergen Reactions  . Citalopram Nausea Only  . Trazodone And Nefazodone Other (See Comments)    Dry mouth  . Zoloft [Sertraline Hcl] Other (See Comments)    dizzy     (Not in a hospital admission) There were no vitals taken for this visit.  PHYSICAL  EXAM: Overall appearance:  Healthy appearing, in no distress Head:  Normocephalic, atraumatic. Ears: External ears healthy. Nose: External nose is healthy in appearance. Internal nasal exam free of any lesions or obstruction. Oral Cavity/pharynx:  There are no mucosal lesions or masses identified. Neuro:  No identifiable neurologic deficits. Neck: No palpable neck masses. Stoma healthy. Fistula site with clear mucous drainage.  Studies Reviewed: none    Assessment/Plan Attempt repair of fistula with muscle flap.  Usha Slager 04/07/2016, 9:09 AM

## 2016-04-09 NOTE — Interval H&P Note (Signed)
History and Physical Interval Note:  04/09/2016 7:13 AM  Timothy Suarez  has presented today for surgery, with the diagnosis of pharyngocutaneous fistula  The various methods of treatment have been discussed with the patient and family. After consideration of risks, benefits and other options for treatment, the patient has consented to  Procedure(s): FISTULA REPAIR WITH POSSIBLE MUSCLE ROTATION FLAP (N/A) as a surgical intervention .  The patient's history has been reviewed, patient examined, no change in status, stable for surgery.  I have reviewed the patient's chart and labs.  Questions were answered to the patient's satisfaction.     [name]

## 2016-04-09 NOTE — Anesthesia Postprocedure Evaluation (Signed)
Anesthesia Post Note  Patient: Timothy Suarez  Procedure(s) Performed: Procedure(s) (LRB): FISTULA REPAIR WITH  MUSCLE ROTATION FLAP (N/A)  Anesthesia Post Evaluation  Last Vitals:  Vitals:   04/09/16 0640  BP: 138/68  Pulse: 75  Resp: 18  Temp: 36.3 C    Last Pain:  Vitals:   04/09/16 0640  TempSrc: Oral                 EDWARDS,Jevaun Strick

## 2016-04-09 NOTE — Op Note (Signed)
OPERATIVE REPORT  DATE OF SURGERY: 04/09/2016  PATIENT:  Timothy Suarez,  82 y.o. male  PRE-OPERATIVE DIAGNOSIS:  pharyngocutaneous fistula  POST-OPERATIVE DIAGNOSIS:  pharyngocutaneous fistula  PROCEDURE:  Procedure(s): FISTULA REPAIR WITH  MUSCLE ROTATION FLAP  SURGEON:  Beckie Salts, MD  ASSISTANTS: None  ANESTHESIA:   General   EBL:  20 ml  DRAINS: 7 [place] round JP  LOCAL MEDICATIONS USED:  None  SPECIMEN:  Tissue surrounding the fistula tract  COUNTS:  Correct  PROCEDURE DETAILS: The patient was taken to the operating room and placed on the operating table in the supine position. Following induction of general endotracheal anesthesia, the neck was prepped and draped in a standard fashion. The incision was opened using electrocautery and the existing sutures were removed. Probing of the fistula tract identified the narrow opening. A 38 French Maloney dilator was placed in the esophagus. This was identified through the fistula tract. Entire tract was epithelialized. This was all excised using electrocautery, and sent for pathologic evaluation. The resulting defect in the pharynx was about 7 or 8 mm. This was reapproximated with interrupted 3-0 Vicryl sutures. This was found to be watertight after the closure. An [place] syringe with saline was used in the oral cavity. The dilator was removed. A segment of sternomastoid muscle was dissected off the left side and rotated towards the right. This was then reapproximated to the subcutaneous tissue on the right side of the incision. Additional undermining was performed to allow better rotation of the muscle and to allow the skin to close over it. The drain was placed through separate stab incision on the left and secured in place with nylon suture. Subcutaneous closure was accomplished with interrupted 3-0 Vicryl. Additional skin sutures were used to create a good airtight seal. Dermabond was used on the surface. The drain was charged  and was holding a seal. A dressing was applied of 4 x 4's and tape. The patient was awakened extubated and transferred to recovery in stable condition.    PATIENT DISPOSITION:  To PACU, stable

## 2016-04-09 NOTE — Anesthesia Preprocedure Evaluation (Addendum)
Anesthesia Evaluation  Patient identified by MRN, date of birth, ID band Patient awake    Reviewed: Allergy & Precautions, H&P , NPO status   Airway Mallampati: I  TM Distance: >3 FB Neck ROM: full    Dental  (+) Teeth Intact, Dental Advidsory Given   Pulmonary shortness of breath and with exertion,    breath sounds clear to auscultation       Cardiovascular hypertension, + CAD, + CABG and + Peripheral Vascular Disease  Atrial Fibrillation MVP  Rhythm:regular Rate:Normal     Neuro/Psych PSYCHIATRIC DISORDERS    GI/Hepatic negative GI ROS, Neg liver ROS,   Endo/Other  negative endocrine ROS  Renal/GU negative Renal ROS     Musculoskeletal  (+) Arthritis ,   Abdominal   Peds  Hematology   Anesthesia Other Findings   Reproductive/Obstetrics                           Anesthesia Physical Anesthesia Plan  ASA: III  Anesthesia Plan: General   Post-op Pain Management:    Induction: Intravenous  Airway Management Planned: Oral ETT  Additional Equipment:   Intra-op Plan:   Post-operative Plan: Extubation in OR  Informed Consent:   Dental Advisory Given and Dental advisory given  Plan Discussed with: Anesthesiologist, CRNA and Surgeon  Anesthesia Plan Comments:        Anesthesia Quick Evaluation

## 2016-04-09 NOTE — Transfer of Care (Signed)
Immediate Anesthesia Transfer of Care Note  Patient: Timothy Suarez  Procedure(s) Performed: Procedure(s): FISTULA REPAIR WITH  MUSCLE ROTATION FLAP (N/A)  Patient Location: PACU  Anesthesia Type:General  Level of Consciousness: awake, alert  and oriented  Airway & Oxygen Therapy: Patient Spontanous Breathing and Patient connected to tracheostomy mask oxygen  Post-op Assessment: Report given to RN, Post -op Vital signs reviewed and stable and Patient moving all extremities X 4  Post vital signs: Reviewed and stable  Last Vitals:  Vitals:   04/09/16 0640  BP: 138/68  Pulse: 75  Resp: 18  Temp: 36.3 C    Last Pain:  Vitals:   04/09/16 0640  TempSrc: Oral         Complications: No apparent anesthesia complications

## 2016-04-09 NOTE — Telephone Encounter (Signed)
Message was left on triage voicemail from Ferne Reus. Letta Median was calling to inquire about order request for Physical Therapy and Speech Therapy.  I called Letta Median and left message giving verbal order. I also informed Letta Median on the message that if she see's patient before he returns call to inform him that he is due for an appointment with Dr.Reed

## 2016-04-09 NOTE — Telephone Encounter (Signed)
Left message on voicemail for patient to return call when available   

## 2016-04-09 NOTE — Anesthesia Procedure Notes (Signed)
Procedure Name: Intubation Date/Time: 04/09/2016 7:37 AM Performed by: Neldon Newport Pre-anesthesia Checklist: Timeout performed, Patient being monitored, Suction available, Emergency Drugs available and Patient identified Patient Re-evaluated:Patient Re-evaluated prior to inductionOxygen Delivery Method: Circle system utilized Preoxygenation: Pre-oxygenation with 100% oxygen Intubation Type: IV induction Ventilation: Unable to mask ventilate Tube type: Reinforced Tube size: 7.0 (Placed through existing tracheal stoma site.  ) mm Number of attempts: 1 Placement Confirmation: breath sounds checked- equal and bilateral and positive ETCO2 Tube secured with: Tape Dental Injury: Teeth and Oropharynx as per pre-operative assessment

## 2016-04-09 NOTE — Progress Notes (Signed)
Patient ID: Timothy Suarez, male   DOB: [birth year], 82 y.o.   MRN: [id number] Postop evaluation.  Doing well, no complaints. He is awake and alert and able to write down questions. [name] is clear and dry. The dressing is in place. The drain is holding a nice airtight seal. Continue current care. Consider discharge home tomorrow if the family is interested and motivated. We will teach drain care. Plan to keep the drain in for about 5 days.

## 2016-04-09 NOTE — Anesthesia Postprocedure Evaluation (Signed)
Anesthesia Post Note  Patient: Timothy Suarez  Procedure(s) Performed: Procedure(s) (LRB): FISTULA REPAIR WITH  MUSCLE ROTATION FLAP (N/A)  Patient location during evaluation: PACU Anesthesia Type: General Level of consciousness: awake Pain management: pain level controlled Vital Signs Assessment: post-procedure vital signs reviewed and stable Respiratory status: spontaneous breathing Cardiovascular status: blood pressure returned to baseline Anesthetic complications: no    Last Vitals:  Vitals:   04/09/16 1029 04/09/16 1046  BP: 136/61 133/64  Pulse: 82 80  Resp: (!) 24 19  Temp: (!) 36 C 36.4 C    Last Pain:  Vitals:   04/09/16 1046  TempSrc: Oral  PainSc:                  EDWARDS,Julietta Batterman

## 2016-04-10 ENCOUNTER — Encounter (HOSPITAL_COMMUNITY): Payer: Self-pay | Admitting: Otolaryngology

## 2016-04-10 DIAGNOSIS — J392 Other diseases of pharynx: Secondary | ICD-10-CM | POA: Diagnosis not present

## 2016-04-10 NOTE — Progress Notes (Signed)
Discussed discharge summary with patient and his daughter. Reviewed all medications with patient. Patient PEG tube clamped. JP drain left in place. Patient sent home with JP documentation sheet and supplies. Patient ready for discharge. Transported out by wheel chair.

## 2016-04-10 NOTE — Progress Notes (Signed)
Patient ID: Timothy Suarez, male   DOB: [birth year], 82 y.o.   MRN: [id number] Subjective: Doing well, no complaints.  Objective: Vital signs in last 24 hours: Temp:  [96.8 F (36 C)-98.4 F (36.9 C)] 98.3 F (36.8 C) (09/01 0415) Pulse Rate:  [62-82] 62 (09/01 0415) Resp:  [10-24] 10 (09/01 0415) BP: (114-136)/(54-69) 126/56 (09/01 0415) SpO2:  [100 %] 100 % (09/01 0415) Weight:  [87.2 kg (192 lb 3.9 oz)] 87.2 kg (192 lb 3.9 oz) (08/31 1046) Weight change: 0.109 kg (3.9 oz) Last BM Date: 04/08/16  Intake/Output from previous day: 08/31 0701 - 09/01 0700 In: 2930.8 [I.V.:2480; NG/GT:350.8; IV Piggyback:100] Out: [id number] [Urine:1350; Drains:15; Blood:50] Intake/Output this shift: Total I/O In: 0  Out: 400 [Urine:400]  PHYSICAL EXAM: Awake and alert. Stoma with mucoid secretions. Dressing removed. Incision dry and clean, intact, no swelling, erythema or drainage. The JP is holding a seal and there is a small amount of serous material, no mucus or saliva.  Lab Results:  Recent Labs  04/09/16 0643  WBC 8.4  HGB 13.0  HCT 41.7  PLT 207   BMET  Recent Labs  04/09/16 0643  NA 141  K 4.0  CL 112*  CO2 20*  GLUCOSE 98  BUN 32*  CREATININE 1.00  CALCIUM 8.9    Studies/Results: No results found.  Medications: I have reviewed the patient's current medications.  Assessment/Plan: Postop day 1, progressing well so far. Plan on discharge home, will instruct patient and his daughter had to care for the drain and we will see him back in the office next week.  LOS: 1 day   Sylena Lotter 04/10/2016, 9:54 AM

## 2016-04-10 NOTE — Discharge Instructions (Signed)
Do not eat or drink anything by mouth.  Resume medications and tube feeds as before.  Empty and then charge the drain 2-3 times each day. Please make sure that the drain bulb is compressed.

## 2016-04-10 NOTE — Discharge Summary (Signed)
  Physician Discharge Summary  Patient ID: Timothy Suarez MRN: [id number] DOB/AGE: [birth year] 82 y.o.  Admit date: 04/09/2016 Discharge date: 04/10/2016  Admission Diagnoses:Pharyngocutaneous fistula  Discharge Diagnoses:  Active Problems:   Pharyngocutaneous fistula   Discharged Condition: good  Hospital Course: No complications  Consults: none  Significant Diagnostic Studies: none  Treatments: surgery: Removal of fistula tract with 3 layer closure of fistula  Discharge Exam: Blood pressure (!) 126/56, pulse 62, temperature 98.3 F (36.8 C), temperature source Oral, resp. rate 10, height 6\' 3"  (1.905 m), weight 87.2 kg (192 lb 3.9 oz), SpO2 100 %. PHYSICAL EXAM: He looks good, awake and alert, incision clean dry and intact. The JP is holding a seal with only serous material.  Disposition: 03-Skilled Nursing Facility  Discharge Instructions    Increase activity slowly    Complete by:  As directed       Medication List    TAKE these medications   amLODipine 2.5 MG tablet Commonly known as:  NORVASC Place 1 tablet (2.5 mg total) into feeding tube daily.   atorvastatin 40 MG tablet Commonly known as:  LIPITOR TAKE 1 TABLET BY G tube DAILY AT 6PM FOR CHOLESTEROL   levothyroxine 50 MCG tablet Commonly known as:  SYNTHROID, LEVOTHROID Place 50 mcg into feeding tube daily before breakfast.   Vitamin D 2000 units Caps 1 capsule (2,000 Units total) by Gastrostomy Tube route daily at 8 pm. Take one capsule by mouth once daily   zolpidem 5 MG tablet Commonly known as:  AMBIEN Take 1 tablet (5 mg total) by mouth at bedtime as needed. for sleep      Follow-up Information    Azzie Thiem, MD. Schedule an appointment as soon as possible for a visit in 1 week(s).   Specialty:  Otolaryngology Contact information: [address] [place] [address] [phone]           Signed: Izora Gala 04/10/2016, 9:57 AM

## 2016-04-28 ENCOUNTER — Ambulatory Visit: Payer: Self-pay | Admitting: Plastic Surgery

## 2016-04-28 DIAGNOSIS — S1190XA Unspecified open wound of unspecified part of neck, initial encounter: Secondary | ICD-10-CM

## 2016-04-30 ENCOUNTER — Encounter (HOSPITAL_COMMUNITY): Payer: Self-pay | Admitting: *Deleted

## 2016-04-30 ENCOUNTER — Ambulatory Visit: Payer: Self-pay | Admitting: Otolaryngology

## 2016-05-03 NOTE — Anesthesia Preprocedure Evaluation (Addendum)
Anesthesia Evaluation  Patient identified by MRN, date of birth, ID band Patient awake    Reviewed: Allergy & Precautions, H&P , NPO status , Patient's Chart, lab work & pertinent test results  Airway Mallampati: I  TM Distance: >3 FB Neck ROM: full    Dental  (+) Teeth Intact, Dental Advidsory Given   Pulmonary shortness of breath and with exertion,    breath sounds clear to auscultation       Cardiovascular hypertension, + CAD, + CABG and + Peripheral Vascular Disease   Rhythm:regular Rate:Normal     Neuro/Psych PSYCHIATRIC DISORDERS    GI/Hepatic negative GI ROS, Neg liver ROS,   Endo/Other  negative endocrine ROS  Renal/GU negative Renal ROS     Musculoskeletal  (+) Arthritis ,   Abdominal   Peds  Hematology   Anesthesia Other Findings   Reproductive/Obstetrics                            Anesthesia Physical  Anesthesia Plan  ASA: III  Anesthesia Plan: General   Post-op Pain Management:    Induction: Intravenous  Airway Management Planned: Tracheostomy  Additional Equipment:   Intra-op Plan:   Post-operative Plan: Extubation in OR  Informed Consent:   Dental Advisory Given and Dental advisory given  Plan Discussed with: Anesthesiologist, CRNA and Surgeon  Anesthesia Plan Comments: (Previous 7.0 reinforced ETT through tracheal stoma site, consider precedex adjunct)       Anesthesia Quick Evaluation

## 2016-05-04 ENCOUNTER — Encounter (HOSPITAL_COMMUNITY)
Admission: RE | Disposition: A | Discharge status: Skilled Nursing Facility | Payer: Self-pay | Source: Ambulatory Visit | Attending: Otolaryngology

## 2016-05-04 ENCOUNTER — Inpatient Hospital Stay (HOSPITAL_COMMUNITY): Payer: Medicare Other | Admitting: Anesthesiology

## 2016-05-04 ENCOUNTER — Inpatient Hospital Stay (HOSPITAL_COMMUNITY)
Admission: RE | Admit: 2016-05-04 | Discharge: 2016-05-15 | DRG: 909 | Disposition: A | Discharge status: Skilled Nursing Facility | Payer: Medicare Other | Source: Ambulatory Visit | Attending: Otolaryngology | Admitting: Otolaryngology

## 2016-05-04 ENCOUNTER — Encounter (HOSPITAL_COMMUNITY): Payer: Self-pay | Admitting: General Practice

## 2016-05-04 DIAGNOSIS — Z9841 Cataract extraction status, right eye: Secondary | ICD-10-CM | POA: Diagnosis not present

## 2016-05-04 DIAGNOSIS — Z923 Personal history of irradiation: Secondary | ICD-10-CM | POA: Diagnosis not present

## 2016-05-04 DIAGNOSIS — Z8521 Personal history of malignant neoplasm of larynx: Secondary | ICD-10-CM | POA: Diagnosis not present

## 2016-05-04 DIAGNOSIS — T8183XA Persistent postprocedural fistula, initial encounter: Secondary | ICD-10-CM | POA: Diagnosis present

## 2016-05-04 DIAGNOSIS — Z9842 Cataract extraction status, left eye: Secondary | ICD-10-CM | POA: Diagnosis not present

## 2016-05-04 DIAGNOSIS — Z808 Family history of malignant neoplasm of other organs or systems: Secondary | ICD-10-CM | POA: Diagnosis not present

## 2016-05-04 DIAGNOSIS — M1990 Unspecified osteoarthritis, unspecified site: Secondary | ICD-10-CM | POA: Diagnosis present

## 2016-05-04 DIAGNOSIS — I10 Essential (primary) hypertension: Secondary | ICD-10-CM | POA: Diagnosis present

## 2016-05-04 DIAGNOSIS — Z79899 Other long term (current) drug therapy: Secondary | ICD-10-CM | POA: Diagnosis not present

## 2016-05-04 DIAGNOSIS — S1190XA Unspecified open wound of unspecified part of neck, initial encounter: Secondary | ICD-10-CM

## 2016-05-04 DIAGNOSIS — Z888 Allergy status to other drugs, medicaments and biological substances status: Secondary | ICD-10-CM

## 2016-05-04 DIAGNOSIS — Z9049 Acquired absence of other specified parts of digestive tract: Secondary | ICD-10-CM | POA: Diagnosis not present

## 2016-05-04 DIAGNOSIS — Z9889 Other specified postprocedural states: Secondary | ICD-10-CM | POA: Diagnosis not present

## 2016-05-04 DIAGNOSIS — Z8546 Personal history of malignant neoplasm of prostate: Secondary | ICD-10-CM | POA: Diagnosis not present

## 2016-05-04 DIAGNOSIS — G3184 Mild cognitive impairment, so stated: Secondary | ICD-10-CM | POA: Diagnosis present

## 2016-05-04 DIAGNOSIS — I2510 Atherosclerotic heart disease of native coronary artery without angina pectoris: Secondary | ICD-10-CM | POA: Diagnosis present

## 2016-05-04 DIAGNOSIS — Z807 Family history of other malignant neoplasms of lymphoid, hematopoietic and related tissues: Secondary | ICD-10-CM

## 2016-05-04 DIAGNOSIS — Z961 Presence of intraocular lens: Secondary | ICD-10-CM | POA: Diagnosis present

## 2016-05-04 DIAGNOSIS — Z931 Gastrostomy status: Secondary | ICD-10-CM

## 2016-05-04 DIAGNOSIS — Z96651 Presence of right artificial knee joint: Secondary | ICD-10-CM | POA: Diagnosis present

## 2016-05-04 DIAGNOSIS — H409 Unspecified glaucoma: Secondary | ICD-10-CM | POA: Diagnosis present

## 2016-05-04 DIAGNOSIS — Z85038 Personal history of other malignant neoplasm of large intestine: Secondary | ICD-10-CM

## 2016-05-04 DIAGNOSIS — J392 Other diseases of pharynx: Secondary | ICD-10-CM | POA: Diagnosis present

## 2016-05-04 HISTORY — PX: SKIN SPLIT GRAFT: SHX444

## 2016-05-04 HISTORY — PX: PECTORALIS FLAP: SHX6228

## 2016-05-04 LAB — BASIC METABOLIC PANEL
Anion gap: 7 (ref 5–15)
BUN: 28 mg/dL — ABNORMAL HIGH (ref 6–20)
CHLORIDE: 110 mmol/L (ref 101–111)
CO2: 22 mmol/L (ref 22–32)
CREATININE: 0.82 mg/dL (ref 0.61–1.24)
Calcium: 8.9 mg/dL (ref 8.9–10.3)
GFR calc non Af Amer: >60 mL/min (ref >60)
Glucose, Bld: 106 mg/dL — ABNORMAL HIGH (ref 65–99)
POTASSIUM: 3.9 mmol/L (ref 3.5–5.1)
Sodium: 139 mmol/L (ref 135–145)

## 2016-05-04 LAB — CBC
HEMATOCRIT: 41.1 % (ref 39.0–52.0)
HEMOGLOBIN: 13.2 g/dL (ref 13.0–17.0)
MCH: 30.5 pg (ref 26.0–34.0)
MCHC: 32.1 g/dL (ref 30.0–36.0)
MCV: 94.9 fL (ref 78.0–100.0)
PLATELETS: 181 10*3/uL (ref 150–400)
RBC: 4.33 MIL/uL (ref 4.22–5.81)
RDW: 13.9 % (ref 11.5–15.5)
WBC: 7.6 10*3/uL (ref 4.0–10.5)

## 2016-05-04 SURGERY — APPLICATION, GRAFT, SKIN, SPLIT-THICKNESS
Anesthesia: General | Site: Chest | Laterality: Left

## 2016-05-04 MED ORDER — CIPROFLOXACIN-DEXAMETHASONE 0.3-0.1 % OT SUSP
OTIC | Status: AC
  Filled 2016-05-04: qty 7.5

## 2016-05-04 MED ORDER — VITAMIN D 1000 UNITS PO TABS
2000.0000 [IU] | ORAL_TABLET | Freq: Every day | ORAL | Status: DC
  Administered 2016-05-05 – 2016-05-10 (×6): 2000 [IU] via ORAL
  Administered 2016-05-11: 1000 [IU] via ORAL
  Administered 2016-05-12 – 2016-05-15 (×4): 2000 [IU] via ORAL
  Filled 2016-05-04 (×11): qty 2

## 2016-05-04 MED ORDER — ZOLPIDEM TARTRATE 5 MG PO TABS
5.0000 mg | ORAL_TABLET | Freq: Every evening | ORAL | Status: DC | PRN
  Administered 2016-05-04 – 2016-05-14 (×10): 5 mg
  Filled 2016-05-04 (×10): qty 1

## 2016-05-04 MED ORDER — EVICEL 5 ML EX KIT
PACK | CUTANEOUS | Status: AC
  Filled 2016-05-04: qty 1

## 2016-05-04 MED ORDER — IBUPROFEN 100 MG/5ML PO SUSP
400.0000 mg | Freq: Four times a day (QID) | ORAL | Status: DC | PRN
  Filled 2016-05-04: qty 20

## 2016-05-04 MED ORDER — FENTANYL CITRATE (PF) 100 MCG/2ML IJ SOLN
INTRAMUSCULAR | Status: AC
  Filled 2016-05-04: qty 2

## 2016-05-04 MED ORDER — EPHEDRINE 5 MG/ML INJ
INTRAVENOUS | Status: AC
  Filled 2016-05-04: qty 10

## 2016-05-04 MED ORDER — SODIUM CHLORIDE 0.9 % IR SOLN
Status: DC | PRN
  Administered 2016-05-04: 500 mL

## 2016-05-04 MED ORDER — PROPOFOL 10 MG/ML IV BOLUS
INTRAVENOUS | Status: AC
  Filled 2016-05-04: qty 20

## 2016-05-04 MED ORDER — LACTATED RINGERS IV SOLN
INTRAVENOUS | Status: DC | PRN
  Administered 2016-05-04 (×2): via INTRAVENOUS

## 2016-05-04 MED ORDER — LIDOCAINE 2% (20 MG/ML) 5 ML SYRINGE
INTRAMUSCULAR | Status: AC
  Filled 2016-05-04: qty 5

## 2016-05-04 MED ORDER — SODIUM CHLORIDE 0.9 % IR SOLN
Status: DC | PRN
  Administered 2016-05-04 (×2): 1000 mL

## 2016-05-04 MED ORDER — LIDOCAINE-EPINEPHRINE 1 %-1:100000 IJ SOLN
INTRAMUSCULAR | Status: DC | PRN
  Administered 2016-05-04: 20 mL

## 2016-05-04 MED ORDER — DEXMEDETOMIDINE HCL IN NACL 200 MCG/50ML IV SOLN
INTRAVENOUS | Status: AC
  Filled 2016-05-04: qty 100

## 2016-05-04 MED ORDER — ROCURONIUM BROMIDE 100 MG/10ML IV SOLN
INTRAVENOUS | Status: DC | PRN
  Administered 2016-05-04: 10 mg via INTRAVENOUS
  Administered 2016-05-04: 40 mg via INTRAVENOUS
  Administered 2016-05-04: 10 mg via INTRAVENOUS
  Administered 2016-05-04: 20 mg via INTRAVENOUS
  Administered 2016-05-04: 10 mg via INTRAVENOUS

## 2016-05-04 MED ORDER — PROPOFOL 10 MG/ML IV BOLUS
INTRAVENOUS | Status: DC | PRN
  Administered 2016-05-04: 120 mg via INTRAVENOUS

## 2016-05-04 MED ORDER — SUGAMMADEX SODIUM 200 MG/2ML IV SOLN
INTRAVENOUS | Status: DC | PRN
  Administered 2016-05-04: 175 mg via INTRAVENOUS

## 2016-05-04 MED ORDER — CLINDAMYCIN PHOSPHATE 600 MG/50ML IV SOLN
600.0000 mg | Freq: Three times a day (TID) | INTRAVENOUS | Status: DC
  Administered 2016-05-04 – 2016-05-07 (×9): 600 mg via INTRAVENOUS
  Filled 2016-05-04 (×12): qty 50

## 2016-05-04 MED ORDER — CEFAZOLIN SODIUM-DEXTROSE 2-4 GM/100ML-% IV SOLN
2.0000 g | INTRAVENOUS | Status: AC
  Administered 2016-05-04: 2 g via INTRAVENOUS
  Filled 2016-05-04: qty 100

## 2016-05-04 MED ORDER — ONDANSETRON HCL 4 MG/2ML IJ SOLN
INTRAMUSCULAR | Status: DC | PRN
  Administered 2016-05-04: 4 mg via INTRAVENOUS

## 2016-05-04 MED ORDER — ONDANSETRON HCL 4 MG/2ML IJ SOLN
INTRAMUSCULAR | Status: AC
  Filled 2016-05-04: qty 2

## 2016-05-04 MED ORDER — SUCCINYLCHOLINE CHLORIDE 200 MG/10ML IV SOSY
PREFILLED_SYRINGE | INTRAVENOUS | Status: AC
  Filled 2016-05-04: qty 10

## 2016-05-04 MED ORDER — ROCURONIUM BROMIDE 10 MG/ML (PF) SYRINGE
PREFILLED_SYRINGE | INTRAVENOUS | Status: AC
  Filled 2016-05-04: qty 10

## 2016-05-04 MED ORDER — HYDROMORPHONE HCL 1 MG/ML IJ SOLN: 0.2500 mg | INTRAMUSCULAR | Status: DC | PRN

## 2016-05-04 MED ORDER — EVICEL 5 ML EX KIT
PACK | CUTANEOUS | Status: DC | PRN
  Administered 2016-05-04 (×2): 1

## 2016-05-04 MED ORDER — FENTANYL CITRATE (PF) 100 MCG/2ML IJ SOLN
INTRAMUSCULAR | Status: DC | PRN
  Administered 2016-05-04: 100 ug via INTRAVENOUS
  Administered 2016-05-04: 50 ug via INTRAVENOUS

## 2016-05-04 MED ORDER — PHENYLEPHRINE 40 MCG/ML (10ML) SYRINGE FOR IV PUSH (FOR BLOOD PRESSURE SUPPORT)
PREFILLED_SYRINGE | INTRAVENOUS | Status: DC | PRN
  Administered 2016-05-04: 40 ug via INTRAVENOUS

## 2016-05-04 MED ORDER — AMLODIPINE BESYLATE 2.5 MG PO TABS
2.5000 mg | ORAL_TABLET | Freq: Every day | ORAL | Status: DC
  Administered 2016-05-05 – 2016-05-15 (×11): 2.5 mg
  Filled 2016-05-04 (×11): qty 1

## 2016-05-04 MED ORDER — LIDOCAINE-EPINEPHRINE 1 %-1:100000 IJ SOLN
INTRAMUSCULAR | Status: AC
  Filled 2016-05-04: qty 1

## 2016-05-04 MED ORDER — HYDROCODONE-ACETAMINOPHEN 7.5-325 MG/15ML PO SOLN
10.0000 mL | ORAL | Status: DC | PRN
  Administered 2016-05-04 – 2016-05-11 (×12): 15 mL
  Administered 2016-05-12: 10 mL
  Administered 2016-05-12: 15 mL
  Administered 2016-05-13: 10 mL
  Administered 2016-05-14: 15 mL
  Filled 2016-05-04 (×18): qty 15

## 2016-05-04 MED ORDER — SUGAMMADEX SODIUM 200 MG/2ML IV SOLN
INTRAVENOUS | Status: AC
  Filled 2016-05-04: qty 2

## 2016-05-04 MED ORDER — CHLORHEXIDINE GLUCONATE CLOTH 2 % EX PADS: 6.0000 | MEDICATED_PAD | Freq: Once | CUTANEOUS | Status: DC

## 2016-05-04 MED ORDER — PHENYLEPHRINE HCL 10 MG/ML IJ SOLN
INTRAVENOUS | Status: DC | PRN
  Administered 2016-05-04: 10 ug/min via INTRAVENOUS

## 2016-05-04 MED ORDER — ALBUMIN HUMAN 5 % IV SOLN
INTRAVENOUS | Status: DC | PRN
  Administered 2016-05-04: 10:00:00 via INTRAVENOUS

## 2016-05-04 MED ORDER — VITAMIN D 50 MCG (2000 UT) PO CAPS: 1.0000 | ORAL_CAPSULE | Freq: Every day | ORAL | Status: DC

## 2016-05-04 MED ORDER — DEXMEDETOMIDINE HCL IN NACL 200 MCG/50ML IV SOLN
INTRAVENOUS | Status: DC | PRN
  Administered 2016-05-04: .5 ug/kg/h via INTRAVENOUS

## 2016-05-04 MED ORDER — LIDOCAINE 2% (20 MG/ML) 5 ML SYRINGE
INTRAMUSCULAR | Status: DC | PRN
  Administered 2016-05-04: 100 mg via INTRAVENOUS

## 2016-05-04 MED ORDER — PHENYLEPHRINE 40 MCG/ML (10ML) SYRINGE FOR IV PUSH (FOR BLOOD PRESSURE SUPPORT)
PREFILLED_SYRINGE | INTRAVENOUS | Status: AC
  Filled 2016-05-04: qty 10

## 2016-05-04 MED ORDER — DEXTROSE-NACL 5-0.9 % IV SOLN
INTRAVENOUS | Status: DC
  Administered 2016-05-04 – 2016-05-07 (×5): via INTRAVENOUS

## 2016-05-04 MED ORDER — ATORVASTATIN CALCIUM 40 MG PO TABS
40.0000 mg | ORAL_TABLET | Freq: Every day | ORAL | Status: DC
  Administered 2016-05-04 – 2016-05-15 (×12): 40 mg via ORAL
  Filled 2016-05-04 (×12): qty 1

## 2016-05-04 SURGICAL SUPPLY — 93 items
APPLIER CLIP 9.375 MED OPEN (MISCELLANEOUS)
ATCH SMKEVC FLXB CAUT HNDSWH (FILTER) ×1 IMPLANT
BAG DECANTER FOR FLEXI CONT (MISCELLANEOUS) ×3 IMPLANT
BINDER BREAST XLRG (GAUZE/BANDAGES/DRESSINGS) ×3 IMPLANT
BIOPATCH RED 1 DISK 7.0 (GAUZE/BANDAGES/DRESSINGS) ×2 IMPLANT
BIOPATCH RED 1IN DISK 7.0MM (GAUZE/BANDAGES/DRESSINGS) ×1
BLADE 10 SAFETY STRL DISP (BLADE) ×3 IMPLANT
BLADE SURG 15 STRL LF DISP TIS (BLADE) ×1 IMPLANT
BLADE SURG 15 STRL SS (BLADE) ×2
CANISTER SUCTION 2500CC (MISCELLANEOUS) ×6 IMPLANT
CHLORAPREP W/TINT 26ML (MISCELLANEOUS) IMPLANT
CLEANER TIP ELECTROSURG 2X2 (MISCELLANEOUS) ×3 IMPLANT
CLIP APPLIE 9.375 MED OPEN (MISCELLANEOUS) IMPLANT
CLOSURE WOUND 1/2 X4 (GAUZE/BANDAGES/DRESSINGS) ×1
COTTONBALL LRG STERILE PKG (GAUZE/BANDAGES/DRESSINGS) ×3 IMPLANT
COVER SURGICAL LIGHT HANDLE (MISCELLANEOUS) ×6 IMPLANT
DERMABOND ADVANCED (GAUZE/BANDAGES/DRESSINGS) ×4
DERMABOND ADVANCED .7 DNX12 (GAUZE/BANDAGES/DRESSINGS) ×2 IMPLANT
DRAIN CHANNEL 19F RND (DRAIN) ×3 IMPLANT
DRAPE INCISE 23X17 IOBAN STRL (DRAPES) ×2
DRAPE INCISE IOBAN 23X17 STRL (DRAPES) ×1 IMPLANT
DRAPE MICROSCOPE LEICA 46X105 (MISCELLANEOUS) ×3 IMPLANT
DRAPE ORTHO SPLIT 77X108 STRL (DRAPES) ×4
DRAPE PROXIMA HALF (DRAPES) IMPLANT
DRAPE SURG 17X23 STRL (DRAPES) ×12 IMPLANT
DRAPE SURG ORHT 6 SPLT 77X108 (DRAPES) ×2 IMPLANT
DRAPE WARM FLUID 44X44 (DRAPE) ×3 IMPLANT
DRSG MEPILEX BORDER 4X4 (GAUZE/BANDAGES/DRESSINGS) ×3 IMPLANT
DRSG PAD ABDOMINAL 8X10 ST (GAUZE/BANDAGES/DRESSINGS) ×3 IMPLANT
ELECT BLADE 6.5 EXT (BLADE) ×3 IMPLANT
ELECT CAUTERY BLADE 6.4 (BLADE) ×3 IMPLANT
ELECT COATED BLADE 2.86 ST (ELECTRODE) ×6 IMPLANT
ELECT REM PT RETURN 9FT ADLT (ELECTROSURGICAL) ×3
ELECTRODE REM PT RTRN 9FT ADLT (ELECTROSURGICAL) ×1 IMPLANT
EVACUATOR SILICONE 100CC (DRAIN) ×3 IMPLANT
EVACUATOR SMOKE ACCUVAC VALLEY (FILTER) ×2
GAUZE SPONGE 4X4 12PLY STRL (GAUZE/BANDAGES/DRESSINGS) ×3 IMPLANT
GAUZE SPONGE 4X4 16PLY XRAY LF (GAUZE/BANDAGES/DRESSINGS) ×6 IMPLANT
GAUZE XEROFORM 5X9 LF (GAUZE/BANDAGES/DRESSINGS) ×3 IMPLANT
GEL ULTRASOUND 20GR AQUASONIC (MISCELLANEOUS) ×3 IMPLANT
GLOVE BIO SURGEON STRL SZ 6 (GLOVE) ×3 IMPLANT
GLOVE BIO SURGEON STRL SZ 6.5 (GLOVE) ×16 IMPLANT
GLOVE BIO SURGEON STRL SZ7.5 (GLOVE) ×6 IMPLANT
GLOVE BIO SURGEONS STRL SZ 6.5 (GLOVE) ×8
GLOVE BIOGEL PI IND STRL 6 (GLOVE) ×1 IMPLANT
GLOVE BIOGEL PI IND STRL 7.0 (GLOVE) ×2 IMPLANT
GLOVE BIOGEL PI IND STRL 7.5 (GLOVE) ×1 IMPLANT
GLOVE BIOGEL PI INDICATOR 6 (GLOVE) ×2
GLOVE BIOGEL PI INDICATOR 7.0 (GLOVE) ×4
GLOVE BIOGEL PI INDICATOR 7.5 (GLOVE) ×2
GLOVE ECLIPSE 7.5 STRL STRAW (GLOVE) ×6 IMPLANT
GLOVE ECLIPSE 8.0 STRL XLNG CF (GLOVE) ×3 IMPLANT
GLOVE SURG SS PI 7.0 STRL IVOR (GLOVE) ×6 IMPLANT
GOWN BRE IMP SLV AUR LG STRL (GOWN DISPOSABLE) ×6 IMPLANT
GOWN STRL REUS W/ TWL LRG LVL3 (GOWN DISPOSABLE) ×2 IMPLANT
GOWN STRL REUS W/TWL LRG LVL3 (GOWN DISPOSABLE) ×4
KIT BASIN OR (CUSTOM PROCEDURE TRAY) ×6 IMPLANT
KIT ROOM TURNOVER OR (KITS) ×6 IMPLANT
MARKER SKIN DUAL TIP RULER LAB (MISCELLANEOUS) ×3 IMPLANT
NEEDLE 27GAX1X1/2 (NEEDLE) ×3 IMPLANT
NS IRRIG 1000ML POUR BTL (IV SOLUTION) ×9 IMPLANT
PACK GENERAL/GYN (CUSTOM PROCEDURE TRAY) ×3 IMPLANT
PAD ARMBOARD 7.5X6 YLW CONV (MISCELLANEOUS) ×12 IMPLANT
PENCIL BUTTON HOLSTER BLD 10FT (ELECTRODE) ×6 IMPLANT
PENCIL FOOT CONTROL (ELECTRODE) ×6 IMPLANT
PIN SAFETY STERILE (MISCELLANEOUS) ×3 IMPLANT
PREFILTER EVAC NS 1 1/3-3/8IN (MISCELLANEOUS) ×3 IMPLANT
SOL PREP POV-IOD 4OZ 10% (MISCELLANEOUS) ×3 IMPLANT
SPONGE LAP 18X18 X RAY DECT (DISPOSABLE) IMPLANT
STAPLER VISISTAT 35W (STAPLE) ×3 IMPLANT
STRIP CLOSURE SKIN 1/2X4 (GAUZE/BANDAGES/DRESSINGS) ×2 IMPLANT
SUT CHROMIC 3 0 PS 2 (SUTURE) ×9 IMPLANT
SUT CHROMIC 4 0 PS 2 18 (SUTURE) ×3 IMPLANT
SUT MNCRL AB 3-0 PS2 18 (SUTURE) ×9 IMPLANT
SUT MNCRL AB 4-0 PS2 18 (SUTURE) ×6 IMPLANT
SUT MON AB 2-0 CT1 36 (SUTURE) IMPLANT
SUT MON AB 5-0 PS2 18 (SUTURE) ×9 IMPLANT
SUT PDS AB 0 CT 36 (SUTURE) IMPLANT
SUT PROLENE 3 0 PS 1 (SUTURE) ×3 IMPLANT
SUT SILK 3 0 TIES 10X30 (SUTURE) ×3 IMPLANT
SUT VIC AB 3-0 FS2 27 (SUTURE) IMPLANT
SUT VIC AB 3-0 SH 8-18 (SUTURE) ×6 IMPLANT
SYR 50ML SLIP (SYRINGE) IMPLANT
SYR BULB 3OZ (MISCELLANEOUS) ×3 IMPLANT
SYR BULB IRRIGATION 50ML (SYRINGE) ×6 IMPLANT
SYR CONTROL 10ML LL (SYRINGE) ×3 IMPLANT
TOWEL OR 17X24 6PK STRL BLUE (TOWEL DISPOSABLE) ×3 IMPLANT
TOWEL OR 17X26 10 PK STRL BLUE (TOWEL DISPOSABLE) ×3 IMPLANT
TRAY ENT MC OR (CUSTOM PROCEDURE TRAY) ×3 IMPLANT
TRAY FOLEY CATH 14FRSI W/METER (CATHETERS) ×3 IMPLANT
TUBE CONNECTING 12'X1/4 (SUCTIONS) ×3
TUBE CONNECTING 12X1/4 (SUCTIONS) ×6 IMPLANT
YANKAUER SUCT BULB TIP NO VENT (SUCTIONS) ×3 IMPLANT

## 2016-05-04 NOTE — Transfer of Care (Signed)
Immediate Anesthesia Transfer of Care Note  Patient: Timothy Suarez  Procedure(s) Performed: Procedure(s): PECTORALIS MAJOR MYOCUTANEOUS FLAP RECONSTRUCTION OF PHARYNX AND SPLIT THICKNESS SKIN GRAFT (Left) PECTORALIS FLAP to neck with possible, split thickness skin graft (Left)  Patient Location: PACU  Anesthesia Type:General  Level of Consciousness: awake and patient cooperative  Airway & Oxygen Therapy: Patient Spontanous Breathing and Patient connected to tracheostomy mask oxygen  Post-op Assessment: Report given to RN, Post -op Vital signs reviewed and stable and Patient moving all extremities  Post vital signs: Reviewed and stable  Last Vitals:  Vitals:   05/04/16 0640 05/04/16 1035  BP: (!) 121/57   Pulse: 70 (!) 51  Resp: 20 16  Temp: 36.8 C     Last Pain:  Vitals:   05/04/16 0640  TempSrc: Oral      Patients Stated Pain Goal: 4 ([id number])  Complications: No apparent anesthesia complications

## 2016-05-04 NOTE — H&P (View-Only) (Signed)
Patient ID: Timothy Suarez, male   DOB: [birth year], 82 y.o.   MRN: [id number] Postop evaluation.  Doing well, no complaints. He is awake and alert and able to write down questions. [name] is clear and dry. The dressing is in place. The drain is holding a nice airtight seal. Continue current care. Consider discharge home tomorrow if the family is interested and motivated. We will teach drain care. Plan to keep the drain in for about 5 days.

## 2016-05-04 NOTE — Op Note (Addendum)
  DATE OF PROCEDURE: 05/04/2016  LOCATION: [facility]  SURGEON: Lyndee Leo Sanger Ozie Dimaria, DO  ASSISTANT: Shawn Rayburn, PA  PREOPERATIVE DIAGNOSIS 1. Laryngeal esophageal fistula 2. Neck wound 3. s/p laryngeal cancer  POSTOPERATIVE DIAGNOSIS 1. Laryngeal esophageal fistula 2. Neck wound 3. s/p laryngeal cancer  PROCEDURES 1. Pectoralis major myocutaneous flap to neck  COMPLICATIONS: None.  DRAINS: JP drain left breast pocket  INDICATIONS FOR PROCEDURE Timothy Suarez is a 82 y.o. year old male born on [birth year], with a history of laryngeal cancer with a laryngectomy and postoperative radiation.  He developed a fistula which was repaired twice but occurred again.  MRN: [id number]  CONSENT Informed consent was obtained directly from the patient. The risks, benefits and alternatives were fully discussed. Specific risks including but not limited to bleeding, infection, hematoma, seroma, scarring, pain, necrosis of the flap, asymmetry, poor cosmetic results, and need for further surgery were discussed. The patient had ample opportunity to have her questions answered to her satisfaction.  DESCRIPTION OF PROCEDURE  Patient was brought into the operating room and placed in a supine position.  SCDs were placed and appropriate padding was performed.  Antibiotics were given. The patient underwent general anesthesia and the chest was prepped and draped in a sterile fashion.  A timeout was performed and all information was confirmed to be correct.  A foley catheter was placed.  Dr. Constance Holster prepared the neck while I prepared the flap.  Preoperative markings were confirmed.  Incision lines were injected with local.  After waiting for vasoconstriction, the marked lines were incised. An incision was made at the inferior lateral inframammary fold.  The bovie was used to dissect to the pectoralis muscle.  The muscle was freed from the skin flap to the pre-marked myocutaneous flap.   This area was left to have the flap directly connected to the muscle.  The pectoralis muscle was then lifted and disconnected from the inferior, sternal and clavicular attachments.  The doppler was used to confirm the location of the thoracoacromial vessels which were left intact.  An additional incision was made over the area of the pectoralis major origin.  The bovie was used to dissect to the muscle.  The pectoral muscle was then disconnected from the origin.  The skin island was developed with the #10 blade.  The flap was then brought out and reach was confirmed.  A tunnel was then created under the flap to reach the neck wound.  The flap was introduced into the defect and secured in place with chromic by Dr. Constance Holster.   The skin flap portion was 4 x 5 cm in size.  The breast pocket was irrigated with saline and antibiotic solution.  Hemostasis was achieved with electrocautery.  A JP drain was placed and brought out through the skin at the inferior lateral inframammary fold area.  Evicel was placed in the pocket to assist in seroma formation.  The deep tissues were approximated with 3-0 Vicryl sutures and the skin was closed with deep dermal and subcuticular 4-0 Monocryl sutures followed by 5-0 Monocryl.  The area was secured with 4-0 Monocryl at the deep layers followed by 5-0 Monocryl.  The skin flaps had good capillary refill at the end of the procedure as well as the pedicle skin flap.Dermabond and steri dressings were applied.  The patient tolerated the procedure well. The patient was allowed to wake from anesthesia and taken to the recovery room in satisfactory condition.

## 2016-05-04 NOTE — Interval H&P Note (Signed)
History and Physical Interval Note:  05/04/2016 7:07 AM  Timothy Suarez  has presented today for surgery, with the diagnosis of PHARYNGOCUTANEOUS FISTULA STATUS POST LARYNGECTOMY  The various methods of treatment have been discussed with the patient and family. After consideration of risks, benefits and other options for treatment, the patient has consented to  Procedure(s): PECTORALIS MAJOR MYOCUTANEOUS FLAP RECONSTRUCTION OF PHARYNX AND SPLIT THICKNESS SKIN GRAFT (N/A) PECTORALIS FLAP to neck with possible, split thickness skin graft (N/A) as a surgical intervention .  The patient's history has been reviewed, patient examined, no change in status, stable for surgery.  I have reviewed the patient's chart and labs.  Questions were answered to the patient's satisfaction.     Wallace Going

## 2016-05-04 NOTE — Anesthesia Procedure Notes (Addendum)
Procedure Name: Intubation Date/Time: 05/04/2016 7:44 AM Performed by: Jillyn Hidden Pre-anesthesia Checklist: Patient identified, Emergency Drugs available, Suction available, Patient being monitored and Timeout performed Patient Re-evaluated:Patient Re-evaluated prior to inductionOxygen Delivery Method: Circle system utilized Preoxygenation: Pre-oxygenation with 100% oxygen (mask over stoma) Intubation Type: IV induction Ventilation: Unable to mask ventilate Tube size: 7.0 (reinforced tube ) mm Number of attempts: 1 (inserted into stoma) Secured at: 7 cm Tube secured with: Tape (ETT taped to R chest wall )

## 2016-05-04 NOTE — Brief Op Note (Signed)
05/04/2016  11:17 AM  PATIENT:  Timothy Suarez  82 y.o. male  PRE-OPERATIVE DIAGNOSIS:  PHARYNGOCUTANEOUS FISTULA STATUS POST LARYNGECTOMY  POST-OPERATIVE DIAGNOSIS:  PHARYNGOCUTANEOUS FISTULA STATUS POST  PROCEDURE:  Procedure(s): PECTORALIS MAJOR MYOCUTANEOUS FLAP RECONSTRUCTION OF PHARYNX AND SPLIT THICKNESS SKIN GRAFT (Left) PECTORALIS FLAP to neck with possible, split thickness skin graft (Left)  SURGEON:  Surgeon(s) and Role: Panel 1:    * Izora Gala, MD - Primary  Panel 2:    * Valley, DO - Primary  PHYSICIAN ASSISTANT: Erlinda Hong, PA  ASSISTANTS: Jacques Earthly, MD   ANESTHESIA:   local and general  EBL:  Total I/O In: 1750 [I.V.:1500; IV Piggyback:250] Out: 265 [Urine:115; Blood:150]  BLOOD ADMINISTERED:none  DRAINS: (1) Jackson-Pratt drain(s) with closed bulb suction in the left breast pocket   LOCAL MEDICATIONS USED:  LIDOCAINE   SPECIMEN:  No Specimen  DISPOSITION OF SPECIMEN:  N/A  COUNTS:  YES  TOURNIQUET:  * No tourniquets in log *  DICTATION: .Dragon Dictation  PLAN OF CARE: Admit  PATIENT DISPOSITION:  PACU - hemodynamically stable.   Delay start of Pharmacological VTE agent (>24hrs) due to surgical blood loss or risk of bleeding: no

## 2016-05-04 NOTE — Interval H&P Note (Signed)
History and Physical Interval Note:  05/04/2016 7:22 AM  Timothy Suarez  has presented today for surgery, with the diagnosis of PHARYNGOCUTANEOUS FISTULA STATUS POST LARYNGECTOMY  The various methods of treatment have been discussed with the patient and family. After consideration of risks, benefits and other options for treatment, the patient has consented to  Procedure(s): PECTORALIS MAJOR MYOCUTANEOUS FLAP RECONSTRUCTION OF PHARYNX AND SPLIT THICKNESS SKIN GRAFT (N/A) PECTORALIS FLAP to neck with possible, split thickness skin graft (N/A) as a surgical intervention .  The patient's history has been reviewed, patient examined, no change in status, stable for surgery.  I have reviewed the patient's chart and labs.  Questions were answered to the patient's satisfaction.     Ted Goodner

## 2016-05-04 NOTE — H&P (Signed)
Timothy Suarez is an 82 y.o. male.   Chief Complaint: fistula HPI: The patient is an 82 yrs old wm here with his family for evaluation and treatment of his fistula after a laryngectomy and radiation.  He had the fistula repaired but due to the radiation it broke down again.  We discussed the options of repair and he wishes to proceed with muscle coverage.  When he drinks it pours out of the fistula.  He is now getting nutrition through his G-tube.  Past Medical History:  Diagnosis Date  . A-fib ([place])   . Aortic valve insufficiency, acquired   . Arthritis   . BPH (benign prostatic hyperplasia)   . Cerebral ischemia   . Colon cancer [facility])     s/p partial colectomy  . Complication of anesthesia    "woke up w/confusion and hallucinations once after mitral valve OR"  . Coronary artery disease    50-70% LAD mitral valve repair 4+ yrs ago  . Depression   . Dyslipidemia   . Glaucoma   . Heart murmur   . Hernia   . High cholesterol   . History of echocardiogram    Echo 5/16:  Mild LVH, EF 50-55%, Gr 1 DD, septal HK, Ao sclerosis without stenosis, mild AI, MV repair ok with borderline mild MS (mean 4 mmHg), mild MR, mild LAE, mod RAE, mild TR, PASP 30 mmHg  . HTN (hypertension)    takes Amlodipine daily  . Hx of cardiovascular stress test    Lexiscan Myoview 5/16:  Apical thinning, EF not gated, no ischemia. Low Risk  . Hyperlipidemia   . Major depression ([place])   . Mild cognitive impairment   . MVP (mitral valve prolapse)    S/P Rt mini thoractomy for Mitral Valve repair  . Nocturia   . Paroxysmal atrial fibrillation ([facility])    S/P Maze procedure  . 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 Prostate cancer ([place])   . 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 Shortness of breath dyspnea    with exertion  . Sleep disturbance   . Thoracic aortic aneurysm ([place])   . Urinary frequency   . Urinary urgency     Past  Surgical History:  Procedure Laterality Date  . CARDIAC CATHETERIZATION    . CATARACT EXTRACTION W/ INTRAOCULAR LENS  IMPLANT, BILATERAL Bilateral   . COLECTOMY  2004   Dr [name]  . DIRECT LARYNGOSCOPY N/A 01/15/2016   Procedure: DIRECT LARYNGOSCOPY WITH BIOPSY AND FROZEN SECTION;  Surgeon: Izora Gala, MD;  Location: [place];  Service: ENT;  Laterality: N/A;  . FlexHD patch repair of chest wall hernia.  01/28/2011   [name]  . GASTROSTOMY W/ FEEDING TUBE    . JOINT REPLACEMENT    . LARYNGETOMY N/A 01/15/2016   Procedure:  TOTAL LARYNGECTOMY;  Surgeon: Izora Gala, MD;  Location[facility] OR;  Service: ENT;  Laterality: N/A;  . MAZE  12/27/2008   left side lesion set  . MICROLARYNGOSCOPY Right 01/29/2015   Procedure: MICROLARYNGOSCOPY WITH BIOSPY OF RIGHT VOCAL CORD;  Surgeon: Izora Gala, MD;  Location: [place];  Service: ENT;  Laterality: Right;  . MITRAL VALVE REPAIR  12/27/2008   complex valvuloplasty with 45mm Memo 3D annuloplasty ring via right minithoracotomy  . PROSTATE BIOPSY    . TEE WITHOUT CARDIOVERSION N/A 01/03/2013   Procedure: TRANSESOPHAGEAL ECHOCARDIOGRAM (TEE);  Surgeon: Sueanne Margarita, MD;  [facility];  Service: Cardiovascular;  Laterality: N/A;  . TOTAL KNEE ARTHROPLASTY Right 2003  . TRACHEAL ESOPHAGEAL PUNCTURE REPAIR N/A 03/26/2016   Procedure: TRACHEAL ESOPHAGEAL PUNCTURE;  Surgeon: Izora Gala, MD;  Location: [place];  Service: ENT;  Laterality: N/A;  . TRACHEOESOPHAGEAL FISTULA REPAIR N/A 03/26/2016   Procedure: TRACHEO-ESOPHAGEAL   PUNCTURE,FISTULAR CLOSURE;  Surgeon: Izora Gala, MD;  Location: [place];  Service: ENT;  Laterality: N/A;  . TRACHEOESOPHAGEAL FISTULA REPAIR N/A 04/09/2016   Procedure: FISTULA REPAIR WITH  MUSCLE ROTATION FLAP;  Surgeon: Izora Gala, MD;  Location: [place];  Service: ENT;  Laterality: N/A;  . TYMPANOPLASTY  1967   "? side"    Family History  Problem Relation Age of Onset  . Cancer Mother     lymphoma  . Cancer Father     pancreatic  .  Heart attack Neg Hx   . Stroke Neg Hx    Social History:  reports that he has never smoked. He has never used smokeless tobacco. He reports that he drinks about 8.4 oz of alcohol per week . He reports that he does not use drugs.  Allergies:  Allergies  Allergen Reactions  . Ambien [Zolpidem] Other (See Comments)    Causes confusion  . Citalopram Nausea Only  . Trazodone And Nefazodone Other (See Comments)    Dry mouth  . Zoloft [Sertraline Hcl] Other (See Comments)    dizzy    Medications Prior to Admission  Medication Sig Dispense Refill  . amLODipine (NORVASC) 2.5 MG tablet Place 1 tablet (2.5 mg total) into feeding tube daily. 30 tablet 0  . atorvastatin (LIPITOR) 40 MG tablet TAKE 1 TABLET BY G tube DAILY AT 6PM FOR CHOLESTEROL (Patient taking differently: Take 40 mg by mouth daily at 6 PM. TAKE 1 TABLET BY G tube DAILY AT 6PM FOR CHOLESTEROL) 90 tablet 1  . Cholecalciferol (VITAMIN D) 2000 units CAPS 1 capsule (2,000 Units total) by Gastrostomy Tube route daily at 8 pm. Take one capsule by mouth once daily 30 capsule 0  . zolpidem (AMBIEN) 5 MG tablet Take 1 tablet (5 mg total) by mouth at bedtime as needed. for sleep (Patient not taking: Reported on 04/29/2016) 30 tablet 0    No results found for this or any previous visit (from the past 48 hour(s)). No results found.  Review of Systems  Constitutional: Positive for malaise/fatigue.  HENT: Negative.   Eyes: Negative.   Respiratory: Negative.   Cardiovascular: Negative.   Gastrointestinal: Negative.   Genitourinary: Negative.   Musculoskeletal: Negative.   Skin: Negative.   Neurological: Positive for speech change and weakness.  Psychiatric/Behavioral: Positive for depression.    Blood pressure (!) 121/57, pulse 70, temperature 98.3 F (36.8 C), temperature source Oral, resp. rate 20, height 6\' 3"  (1.905 m), weight 87.1 kg (192 lb), SpO2 98 %. Physical Exam  Constitutional: He is oriented to person, place, and time.  He appears well-developed.  HENT:  Head: Normocephalic and atraumatic.  Eyes: EOM are normal. Pupils are equal, round, and reactive to light.  Respiratory: Effort normal.    trach  GI: He exhibits no distension. There is no tenderness.  Neurological: He is alert and oriented to person, place, and time.  Skin: Skin is warm.  Psychiatric: He has a normal mood and affect. His behavior is normal.     Assessment/Plan Plan for pectoralis myocutaneous flap to the neck for repair of the fistula.  Wallace Going, DO 05/04/2016, 7:03 AM

## 2016-05-04 NOTE — Anesthesia Postprocedure Evaluation (Signed)
Anesthesia Post Note  Patient: Timothy Suarez  Procedure(s) Performed: Procedure(s) (LRB): PECTORALIS MAJOR MYOCUTANEOUS FLAP RECONSTRUCTION OF PHARYNX AND SPLIT THICKNESS SKIN GRAFT (Left) PECTORALIS FLAP to neck with possible, split thickness skin graft (Left)  Patient location during evaluation: PACU Anesthesia Type: General Level of consciousness: awake and alert Pain management: pain level controlled Vital Signs Assessment: post-procedure vital signs reviewed and stable Respiratory status: spontaneous breathing, nonlabored ventilation, respiratory function stable and patient connected to nasal cannula oxygen Cardiovascular status: blood pressure returned to baseline and stable Postop Assessment: no signs of nausea or vomiting Anesthetic complications: no    Last Vitals:  Vitals:   05/04/16 1035 05/04/16 1045  BP: (!) (P) 120/58 (!) 112/53  Pulse: (!) 51 (!) 51  Resp: 16 16  Temp:      Last Pain:  Vitals:   05/04/16 0640  TempSrc: Oral                 Zenaida Deed

## 2016-05-04 NOTE — Op Note (Signed)
OPERATIVE REPORT  DATE OF SURGERY: 05/04/2016  PATIENT:  Timothy Suarez,  82 y.o. male  PRE-OPERATIVE DIAGNOSIS:  PHARYNGOCUTANEOUS FISTULA STATUS POST LARYNGECTOMY  POST-OPERATIVE DIAGNOSIS:  PHARYNGOCUTANEOUS FISTULA STATUS POST  PROCEDURE:  Procedure(s): PECTORALIS MAJOR MYOCUTANEOUS FLAP RECONSTRUCTION OF PHARYNX AND SPLIT THICKNESS SKIN GRAFT PECTORALIS FLAP to neck with possible, split thickness skin graft  SURGEON:  Beckie Salts, MD  ASSISTANTS:    ANESTHESIA:   General   EBL:  50 ml  DRAINS: none  LOCAL MEDICATIONS USED:  None  SPECIMEN:  Peri-fistula tissue  COUNTS:  Correct  PROCEDURE DETAILS: The patient was taken to the operating room and placed on the operating table in the supine position. Following induction of general endotracheal anesthesia, the neck and chest were prepped and draped in a standard fashion.  1. Debridement of wound. A 42 Maloney dilator was placed into the oral cavity and down into the esophagus. The neck wound was inspected. There was some granulation tissue but the skin defect was approximately 5-6 cm in greatest dimension. There is no obvious fistula tract identified. Saline was passed through the oral pharynx using a large syringe and it seemed to pass without any leakage. There was exposed under side of mucosa but the dilator was never visualized directly. The skin around the edges and surrounding subcutaneous tissue was all dissected out using electrocautery. The site was prepared for flap placement by elevating the skin edges all around.  2. After the myocutaneous flap was elevated and delivered through the tunnel up into the neck wound, the skin was secured to the surrounding recipient skin using interrupted 3-0 chromic sutures. There is good blood flow from the skin. Care was taken not to stretch the skin or to apply too much pressure and the flap was never under tension. Once the skin was reapproximated Xeroform and 4 x 4 dressing was  applied.  Patient was awakened extubated and transferred to recovery in stable condition. The remainder of the case was performed by Dr. Marla Roe and will be dictated on a separate report.    PATIENT DISPOSITION:  To PACU, stable

## 2016-05-04 NOTE — Progress Notes (Signed)
Patient ID: Timothy Suarez, male   DOB: Jan 04, 1925, 82 y.o.   MRN: [id number]  He is awake and alert and having minimal discomfort. He does have some bloody drainage and some bloody secretions that he has coughed out of his stoma. The flap is intact and healthy. There is good capillary refill and the skin portion. The dressing has completely fallen off of the flap but the donor sites are intact and healthy. The drains are working.  Stable, postop check. Continue care.

## 2016-05-05 ENCOUNTER — Encounter (HOSPITAL_COMMUNITY): Payer: Self-pay | Admitting: Otolaryngology

## 2016-05-05 MED ORDER — JEVITY 1.2 CAL PO LIQD: 1000.0000 mL | ORAL | Status: DC

## 2016-05-05 MED ORDER — JEVITY 1.2 CAL PO LIQD
300.0000 mL | Freq: Every day | ORAL | Status: DC
  Administered 2016-05-05: 237 mL
  Administered 2016-05-05 – 2016-05-07 (×9): 300 mL
  Filled 2016-05-05 (×5): qty 474

## 2016-05-05 NOTE — Care Management Note (Signed)
Case Management Note  Patient Details  Name: Timothy Suarez MRN: [id number] Date of Birth: 1924/11/08  Subjective/Objective:                    Action/Plan:  Spoke with patient at bedside and his daughter [name] [phone] via phone . Patient and daughter plans for patient to return to [place] at discharge. Prior to admission he was doing bolus feeds himself. He gets his supplies through [facility] . Spoke with [name] at [facility] as long as tube feeds have not changed , [facility] does not need any new orders. Expected Discharge Date:                  Expected Discharge Plan:  Home/Self Care  In-House Referral:     Discharge planning Services     Post Acute Care Choice:    Choice offered to:  Patient, Adult Children  DME Arranged:    DME Agency:     HH Arranged:    [place] Agency:     Status of Service:  Completed, signed off  If discussed at [place] of Stay Meetings, dates discussed:    Additional Comments:  [name], RN 05/05/2016, 1:59 PM

## 2016-05-05 NOTE — Progress Notes (Addendum)
Initial Nutrition Assessment  DOCUMENTATION CODES:   Not applicable  INTERVENTION:   -Initiate bolus feeds of 300 ml of Jevity 1.2 6 times daily via PEG  Tube feeding regimen provides 2160 kcal (98% of needs), 100 grams of protein, and 1453 ml of H2O.   NUTRITION DIAGNOSIS:   Inadequate oral intake related to inability to eat as evidenced by NPO status.  GOAL:   Patient will meet greater than or equal to 90% of their needs  MONITOR:   Labs, Weight trends, Skin, I & O's, TF tolerance  REASON FOR ASSESSMENT:   Consult Enteral/tube feeding initiation and management  ASSESSMENT:   The patient is an 82 yrs old wm here with his family for evaluation and treatment of his fistula after a laryngectomy and radiation.  He had the fistula repaired but due to the radiation it broke down again.  We discussed the options of repair and he wishes to proceed with muscle coverage.  When he drinks it pours out of the fistula.  He is now getting nutrition through his G-tube.   S/p Procedure(s) on 05/04/16: PECTORALIS MAJOR MYOCUTANEOUS FLAP RECONSTRUCTION OF PHARYNX AND SPLIT THICKNESS SKIN GRAFT (Left) PECTORALIS FLAP to neck with possible, split thickness skin graft (Left)  Spoke with pt and family members at bedside. They estimate that pt has lost approximately 30# within the past year, however, this is not consistent with wt hx. Noted pt has experienced approximately a 6% wt loss over the past year.   Per pt daughter, pt consums nothing by mouth and receives 100% of nutrition via PEG. She shares that home regimen is 300 ml Jevity 1.2 6 times daily, but unsure of flushing regimen. Per pt daughter, there was concern about pt losing weight related to not consistently receiving TF 6 times daily, but now are committed to administering TF as prescribed.   Nutrition-Focused physical exam completed. Findings are mild fat depletion, mild muscle depletion, and no edema.   Case discussed with RN.    Labs reviewed.  Diet Order:  Diet NPO time specified  Skin:  Reviewed, no issues  Last BM:  PTA  Height:   Ht Readings from Last 1 Encounters:  05/04/16 6\' 3"  (1.905 m)    Weight:   Wt Readings from Last 1 Encounters:  05/05/16 194 lb 0.1 oz (88 kg)    Ideal Body Weight:  89.1 kg  BMI:  Body mass index is 24.25 kg/m.  Estimated Nutritional Needs:   Kcal:  2200-2400  Protein:  105-120 grams  Fluid:  >2.2 L  EDUCATION NEEDS:   No education needs identified at this time  Chari Parmenter A. Jimmye Norman, RD, LDN, CDE Pager: [phone] After hours Pager: [phone]

## 2016-05-05 NOTE — Progress Notes (Signed)
  Patient ID: Timothy Suarez, male   DOB: [birth year], 82 y.o.   MRN: [id number]  Subjective: Awake and alert, sitting in his chair. No complaints.  Objective: Vital signs in last 24 hours: Temp:  [94.2 F (34.6 C)-98.9 F (37.2 C)] 98.9 F (37.2 C) (09/26 0900) Pulse Rate:  [48-98] 76 (09/26 0900) Resp:  [16-19] 18 (09/26 0900) BP: (112-141)/(44-86) 112/62 (09/26 0900) SpO2:  [93 %-99 %] 98 % (09/26 0900) Weight change:     Intake/Output from previous day: 09/25 0701 - 09/26 0700 In: 3076.3 [I.V.:2726.3; IV Piggyback:350] Out: [id number] [Urine:1265; Drains:330; Blood:150] Intake/Output this shift: No intake/output data recorded.  PHYSICAL EXAM: Flap looks healthy. Skin has good capillary refill. Still having some bloody drainage in his stoma that was suctioned out.  Lab Results:  Recent Labs  05/04/16 0619  WBC 7.6  HGB 13.2  HCT 41.1  PLT 181   BMET  Recent Labs  05/04/16 0619  NA 139  K 3.9  CL 110  CO2 22  GLUCOSE 106*  BUN 28*  CREATININE 0.82  CALCIUM 8.9    Studies/Results: No results found.  Medications: I have reviewed the patient's current medications.  Assessment/Plan: Stable postop, continue care and observation. Start tube feeds. Remove Foley. Start ambulating.  LOS: 1 day   Demmi Sindt 05/05/2016, 9:25 AM

## 2016-05-06 NOTE — Progress Notes (Signed)
1200 Pt is coughing out brownish secretions thru the pharyngeal stoma. Skin flap to neck clean and dry.

## 2016-05-06 NOTE — Progress Notes (Signed)
Patient ID: Timothy Suarez, male   DOB: 03/05/1925, 82 y.o.   MRN: [id number] Subjective: Looks very comfortable, awake and alert.  Objective: Vital signs in last 24 hours: Temp:  [98.4 F (36.9 C)-98.9 F (37.2 C)] 98.4 F (36.9 C) (09/27 0555) Pulse Rate:  [60-86] 85 (09/27 0555) Resp:  [16-18] 16 (09/27 0555) BP: (112-149)/(45-62) 118/54 (09/27 0555) SpO2:  [98 %-99 %] 98 % (09/27 0555) Weight:  [88 kg (194 lb 0.1 oz)] 88 kg (194 lb 0.1 oz) (09/26 1347) Weight change:     Intake/Output from previous day: 09/26 0701 - 09/27 0700 In: 2773.3 [I.V.:1486.3; NG/GT:1137; IV Piggyback:150] Out: 1680 [Urine:1400; Drains:280] Intake/Output this shift: No intake/output data recorded.  PHYSICAL EXAM: Flap continues to look healthy. No leakage around the wound. Airway secretions are lessening. Stoma looks healthy.  Lab Results:  Recent Labs  05/04/16 0619  WBC 7.6  HGB 13.2  HCT 41.1  PLT 181   BMET  Recent Labs  05/04/16 0619  NA 139  K 3.9  CL 110  CO2 22  GLUCOSE 106*  BUN 28*  CREATININE 0.82  CALCIUM 8.9    Studies/Results: No results found.  Medications: I have reviewed the patient's current medications.  Assessment/Plan: Progressing nicely. I will talk with his daughter about the possibility of letting him go home with his drain in place. Otherwise continue care.  LOS: 2 days   Torra Pala 05/06/2016, 8:56 AM

## 2016-05-07 MED ORDER — JEVITY 1.2 CAL PO LIQD
300.0000 mL | Freq: Every day | ORAL | Status: DC
  Administered 2016-05-07 (×3): 300 mL
  Filled 2016-05-07 (×16): qty 1000

## 2016-05-07 MED ORDER — JEVITY 1.2 CAL PO LIQD: 300.0000 mL | Freq: Every day | ORAL | Status: DC

## 2016-05-07 MED ORDER — JEVITY 1.2 CAL PO LIQD
300.0000 mL | Freq: Every day | ORAL | Status: DC
Start: 2016-05-07 — End: 2016-05-07
  Filled 2016-05-07: qty 474

## 2016-05-07 NOTE — Progress Notes (Signed)
Patient ID: Timothy Suarez, male   DOB: [birth year], 82 y.o.   MRN: [id number] Subjective: Doing great, no complaints although he does feel little tightness in his chest on the left side.  Objective: Vital signs in last 24 hours: Temp:  [98.1 F (36.7 C)-99.3 F (37.4 C)] 98.1 F (36.7 C) (09/28 0507) Pulse Rate:  [62-85] 62 (09/28 0507) Resp:  [17-18] 18 (09/28 0507) BP: (129-143)/(49-62) 137/51 (09/28 0507) SpO2:  [93 %-100 %] 99 % (09/28 0507) Weight change:  Last BM Date: 05/04/16  Intake/Output from previous day: 09/27 0701 - 09/28 0700 In: 4302.5 [I.V.:1752.5; NG/GT:2400; IV Piggyback:150] Out: 1270 [Urine:1200; Drains:70] Intake/Output this shift: No intake/output data recorded.  PHYSICAL EXAM: Flap looks excellent. Chest looks fine, drainage still significant. Crusty buildup in the stoma cleaned out. Much less secretions.  Lab Results: No results for input(s): WBC, HGB, HCT, PLT in the last 72 hours. BMET No results for input(s): NA, K, CL, CO2, GLUCOSE, BUN, CREATININE, CALCIUM in the last 72 hours.  Studies/Results: No results found.  Medications: I have reviewed the patient's current medications.  Assessment/Plan: Doing great, anticipate starting oral liquids in a few days. Chest tightness normal after the donor site closure.  LOS: 3 days   Jalaine Riggenbach 05/07/2016, 8:33 AM

## 2016-05-07 NOTE — Progress Notes (Signed)
Patient refused nighttime feeding. Stating that he had "coughed up last 2". Will continue to monitor throughout night.  Jimmie Molly, RN

## 2016-05-07 NOTE — Care Management Important Message (Signed)
Important Message  Patient Details  Name: Timothy Suarez MRN: [id number] Date of Birth: September 14, 1924   Medicare Important Message Given:  Yes    Lamanda Rudder Montine Circle 05/07/2016, 3:40 PM

## 2016-05-07 NOTE — Evaluation (Signed)
Physical Therapy Evaluation Patient Details Name: Timothy Suarez MRN: [id number] DOB: 1925/02/16 Today's Date: 05/07/2016   History of Present Illness  Pt is an 82 y/o male presenting for pharyngocutaneous fistula repair now s/p muscle flap on 05/06/16. PMH including but not limited to A-fib, colon cancer, R vocal cord cancer, CAD, HTN, hx of R TKA in 2003 and hx of total larengectomy on 01/15/2016.  Clinical Impression  Pt presented sitting OOB in recliner with visitor present (neighbor) when PT entered room. History was obtained from pt via communication through dry-erase white board and his neighbor. Prior to admission, pt lived at [facility] in an apartment alone in the independent living section. He uses a 4-wheeled RW to ambulate. Pt moved well throughout evaluation with no LOB or need for physical assist during functional movement. PT provided min guard during ambulation for safety. Pt would continue to benefit from skilled physical therapy services at this time while admitted to address his below listed limitations in order to improve his overall safety and independence with functional mobility.      Follow Up Recommendations Supervision for mobility/OOB    Equipment Recommendations  None recommended by PT (pt reported having all necessary DME at home)    Recommendations for Other Services       Precautions / Restrictions Precautions Precautions: Fall Restrictions Weight Bearing Restrictions: No      Mobility  Bed Mobility               General bed mobility comments: pt sitting OOB in recliner when PT entered room, with visitor present (neighbor)  Transfers Overall transfer level: Needs assistance Equipment used: Rolling walker (2 wheeled) Transfers: Sit to/from Stand Sit to Stand: Supervision         General transfer comment: pt required increased time, safe technique demonstrated  Ambulation/Gait Ambulation/Gait assistance: Min guard Ambulation  Distance (Feet): 150 Feet Assistive device: Rolling walker (2 wheeled) Gait Pattern/deviations: Step-through pattern;Decreased stride length;Decreased step length - right;Decreased step length - left Gait velocity: decreased   General Gait Details: pt required occasional VC's to maintain safe distance from [employer] Mobility    Modified Rankin (Stroke Patients Only)       Balance Overall balance assessment: Needs assistance Sitting-balance support: Feet supported;No upper extremity supported Sitting balance-Leahy Scale: Fair     Standing balance support: During functional activity;No upper extremity supported Standing balance-Leahy Scale: Fair                               Pertinent Vitals/Pain Pain Assessment: No/denies pain    Home Living Family/patient expects to be discharged to:: Other (Comment) ([place] living per neighbor's report) Living Arrangements: Alone             Home Equipment: Environmental consultant - 4 wheels Additional Comments: pt resides at [facility] independent living per neighbor's report (he does not live in the "assisted living" section per neighbor's report)    Prior Function Level of Independence: Independent with assistive device(s)         Comments: Pt ambulates with use of RW     Hand Dominance   Dominant Hand: Right    Extremity/Trunk Assessment   Upper Extremity Assessment: Overall WFL for tasks assessed           Lower Extremity Assessment: Overall WFL for tasks assessed;RLE deficits/detail;LLE deficits/detail  RLE Deficits / Details: MMT revealed 4/5 for hip flexion, hip abduction, hip adduction, knee flexion, knee extension and ankle DF. LLE Deficits / Details: MMT revealed 4/5 for hip flexion, hip abduction, hip adduction, knee flexion, knee extension and ankle DF.     Communication   Communication: HOH;Other (comment) (communicates using a dry-erase white board and  marker)  Cognition Arousal/Alertness: Awake/alert Behavior During Therapy: WFL for tasks assessed/performed Overall Cognitive Status: Within Functional Limits for tasks assessed                      General Comments      Exercises     Assessment/Plan    PT Assessment Patient needs continued PT services  PT Problem List Decreased activity tolerance;Decreased strength;Decreased balance;Decreased mobility;Decreased coordination;Decreased knowledge of use of DME          PT Treatment Interventions DME instruction;Gait training;Stair training;Therapeutic activities;Functional mobility training;Therapeutic exercise;Balance training;Neuromuscular re-education;Patient/family education    PT Goals (Current goals can be found in the Care Plan section)  Acute Rehab PT Goals Patient Stated Goal: return home (to [facility]) PT Goal Formulation: With patient Time For Goal Achievement: 05/21/16 Potential to Achieve Goals: Good    Frequency Min 3X/week   Barriers to discharge        Co-evaluation               End of Session   Activity Tolerance: Patient limited by fatigue Patient left: in chair;with call bell/phone within reach;with family/visitor present Nurse Communication: Mobility status         Time: 1203-1227 PT Time Calculation (min) (ACUTE ONLY): 24 min   Charges:   PT Evaluation $PT Eval Moderate Complexity: 1 Procedure PT Treatments $Gait Training: 8-22 mins   PT G CodesClearnce Sorrel Braelon Sprung 05/07/2016, 12:51 PM Sherie Don, [place], DPT [phone]

## 2016-05-08 MED ORDER — JEVITY 1.5 CAL/FIBER PO LIQD
250.0000 mL | Freq: Every day | ORAL | Status: DC
  Administered 2016-05-08 – 2016-05-15 (×35): 250 mL
  Filled 2016-05-08 (×41): qty 1000

## 2016-05-08 MED ORDER — PRO-STAT SUGAR FREE PO LIQD
30.0000 mL | Freq: Every day | ORAL | Status: DC
  Administered 2016-05-08 – 2016-05-15 (×8): 30 mL
  Filled 2016-05-08 (×8): qty 30

## 2016-05-08 NOTE — Progress Notes (Signed)
Patient ID: Timothy Suarez, male   DOB: [birth year], 82 y.o.   MRN: [id number]  Subjective: Seems very comfortable, doing well. He had some tube feed coming up from the stoma last night and small amount. He has been refusing to feed since then. According to the patient and the nurse there is no obvious drainage from the wound.    Objective: Vital signs in last 24 hours: Temp:  [98.5 F (36.9 C)-98.9 F (37.2 C)] 98.5 F (36.9 C) (09/29 0519) Pulse Rate:  [69-87] 69 (09/29 0519) Resp:  [18] 18 (09/29 0519) BP: (138-143)/(32-56) 140/56 (09/29 0525) SpO2:  [98 %-100 %] 98 % (09/29 0519) Weight change:  Last BM Date: 05/04/16  Intake/Output from previous day: 09/28 0701 - 09/29 0700 In: 900 [NG/GT:900] Out: 1975 [Urine:1675; Drains:300] Intake/Output this shift: Total I/O In: 0  Out: 650 [Urine:650]  PHYSICAL EXAM: There is a small amount of exudate in the superior closure that was cleaned off. The skin paddle looks very healthy. There is no obvious infection or drainage with palpation. The stoma was cleaned of dry crusty material.   Lab Results: No results for input(s): WBC, HGB, HCT, PLT in the last 72 hours. BMET No results for input(s): NA, K, CL, CO2, GLUCOSE, BUN, CREATININE, CALCIUM in the last 72 hours.  Studies/Results: No results found.  Medications: I have reviewed the patient's current medications.  Assessment/Plan: Stable, progressing nicely. We may try to start some oral liquids tomorrow depending on how his neck looks. We will continue ambulating and physical therapy and will continue back on tube feeds.  LOS: 4 days   Shavana Calder 05/08/2016, 11:37 AM

## 2016-05-08 NOTE — Progress Notes (Addendum)
Nutrition Follow-up  DOCUMENTATION CODES:   Not applicable  INTERVENTION:   -D/c bolus feeds of 300 ml of Jevity 1.2 6 times daily via PEG  -Initiate bolus feedings of 250 ml of Jevity 1.5 5 times daily via PEG   30 ml Prostat daily.    Tube feeding regimen provides 1975 kcal (99% of needs), 95 grams of protein, and 950 ml of H2O.   -Recommendations for home TF:   Initiate bolus feedings of 250 ml of Jevity 1.5 5 times daily via PEG   30 ml Prostat (or equivalent) daily.    Recommend 50 ml free water flush before and after each feeding administration  Tube feeding regimen provides 1975 kcal (99% of needs), 95 grams of protein, and 950 ml of H2O (1450 ml with inclusion fo free water flush regimen).   NUTRITION DIAGNOSIS:   Inadequate oral intake related to inability to eat as evidenced by NPO status.  Ongoing  GOAL:   Patient will meet greater than or equal to 90% of their needs  Progressing  MONITOR:   Labs, Weight trends, Skin, I & O's, TF tolerance  REASON FOR ASSESSMENT:   Consult Enteral/tube feeding initiation and management  ASSESSMENT:   The patient is an 82 yrs old wm here with his family for evaluation and treatment of his fistula after a laryngectomy and radiation.  He had the fistula repaired but due to the radiation it broke down again.  We discussed the options of repair and he wishes to proceed with muscle coverage.  When he drinks it pours out of the fistula.  He is now getting nutrition through his G-tube.  RD received consult to adjust TF regimen.   Case discussed with RN prior to visiting pt. MD is requesting changing TF formula. Per RN, pt has refused last 3 doses of TF, due to instance of TF coming back up last night. RN also reports plans to trial clear liquids later today.   Spoke with pt at bedside. He reports his TF "came up through my nose" last night. He is excited to try liquids. Discussed with pt importance of continuing TF, as he  will be unable to meet his nutritional needs via clear liquid diet. Discussed possibility of changing to more concentrated formula (Jevity 1.5) to reduce amount administered during bolus feedings and/or reduce frequency of administered feedings. Pt reported "I'll do whatever you want me to do", but also requested I speak with his daughter updating the change.   Called and spoke with pt's daughter, Timothy Suarez. She shared with this RD that she suspect that pt was not always administering TF as prescribed at home (missing doses). She is in agreement with trialing higher calorie formula, but is hopeful pt will transition to oral diet and will not need TF for much longer. She expressed appreciation for call.   Labs reviewed: Glucose: 106.   Diet Order:  Diet NPO time specified  Skin:  Reviewed, no issues  Last BM:  05/04/16  Height:   Ht Readings from Last 1 Encounters:  05/04/16 6\' 3"  (1.905 m)    Weight:   Wt Readings from Last 1 Encounters:  05/05/16 194 lb 0.1 oz (88 kg)    Ideal Body Weight:  89.1 kg  BMI:  Body mass index is 24.25 kg/m.  Estimated Nutritional Needs:   Kcal:  2200-2400  Protein:  105-120 grams  Fluid:  >2.2 L  EDUCATION NEEDS:   No education needs identified at this time  Timothy Suarez A. Jimmye Norman, RD, LDN, CDE Pager: [phone] After hours Pager: [phone]

## 2016-05-09 MED ORDER — POLYETHYLENE GLYCOL 3350 17 G PO PACK
17.0000 g | PACK | Freq: Every day | ORAL | Status: DC | PRN
  Administered 2016-05-10 – 2016-05-12 (×2): 17 g
  Filled 2016-05-09 (×3): qty 1

## 2016-05-09 NOTE — Progress Notes (Signed)
Patient ID: Timothy Suarez, male   DOB: May 03, 1925, 82 y.o.   MRN: [id number] Subjective: He had more drainage from the wound last night. Otherwise doing well.  Objective: Vital signs in last 24 hours: Temp:  [98.4 F (36.9 C)-98.7 F (37.1 C)] 98.4 F (36.9 C) (09/30 0502) Pulse Rate:  [67-76] 72 (09/30 0502) Resp:  [18] 18 (09/30 0502) BP: (123-138)/(41-58) 123/41 (09/30 0502) SpO2:  [96 %-99 %] 99 % (09/30 0502) Weight:  [87 kg (191 lb 12.8 oz)] 87 kg (191 lb 12.8 oz) (09/29 2157) Weight change:  Last BM Date: 05/04/16  Intake/Output from previous day: 09/29 0701 - 09/30 0700 In: 600 [NG/GT:500] Out: 2640 [Urine:2550; Drains:90] Intake/Output this shift: Total I/O In: 0  Out: 300 [Urine:300]  PHYSICAL EXAM: Right eye scabs cleaned out of the stoma. Flap looks excellent. Small defect just superior to the flap. At him take a sip of water and there was some drainage out of the superior defect. This area was packed with wet-to-dry dressing.   Lab Results: No results for input(s): WBC, HGB, HCT, PLT in the last 72 hours. BMET No results for input(s): NA, K, CL, CO2, GLUCOSE, BUN, CREATININE, CALCIUM in the last 72 hours.  Studies/Results: No results found.  Medications: I have reviewed the patient's current medications.  Assessment/Plan: Persistent leak, and anticipate this will now healed now that there is well vascularized muscle tissue in the area. Continue wet-to-dry packing.  LOS: 5 days   Timothy Suarez 05/09/2016, 9:57 AM

## 2016-05-10 NOTE — Progress Notes (Signed)
Patient ID: Timothy Suarez, male   DOB: 1924/11/12, 82 y.o.   MRN: [id number] Subjective: No complaints, sitting in chair, watching TV.  Objective: Vital signs in last 24 hours: Temp:  [98.1 F (36.7 C)-98.4 F (36.9 C)] 98.1 F (36.7 C) (10/01 0555) Pulse Rate:  [73-87] 87 (10/01 0555) Resp:  [17-18] 17 (10/01 0555) BP: (122-139)/(63-82) 122/82 (10/01 0555) SpO2:  [97 %-99 %] 98 % (10/01 0555) Weight:  [88 kg (194 lb 0.1 oz)] 88 kg (194 lb 0.1 oz) (10/01 0900) Weight change:  Last BM Date: 05/04/16  Intake/Output from previous day: 09/30 0701 - 10/01 0700 In: 1250 [NG/GT:1250] Out: 1530 [Urine:1450; Drains:80] Intake/Output this shift: No intake/output data recorded.  PHYSICAL EXAM: Small amount of crusting cleaned out of the stoma. The wound has opened further but there does not appear to be infection. I have repacked the wound. There does appear to be good granulation tissue coming from the flap.  Lab Results: No results for input(s): WBC, HGB, HCT, PLT in the last 72 hours. BMET No results for input(s): NA, K, CL, CO2, GLUCOSE, BUN, CREATININE, CALCIUM in the last 72 hours.  Studies/Results: No results found.  Medications: I have reviewed the patient's current medications.  Assessment/Plan: Continue 3 times a day wound packing, wet to dry.  LOS: 6 days   Ramya Vanbergen 05/10/2016, 10:14 AM

## 2016-05-11 NOTE — Progress Notes (Signed)
Nutrition Follow-up  DOCUMENTATION CODES:   Not applicable  INTERVENTION:   -Continue bolus feedings of 250 ml of Jevity 1.5 5 times daily via PEG   30 ml Prostat daily.    Tube feeding regimen provides 1975 kcal (99% of needs), 95 grams of protein, and 950 ml of H2O.   -Recommendations for home TF:   Initiate bolus feedings of 250 ml of Jevity 1.5 5 times daily via PEG   30 ml Prostat (or equivalent) daily.    Recommend 100 ml free water flush before and after each feeding administration  Tube feeding regimen provides 1975 kcal (99% of needs), 95 grams of protein, and 950 ml of H2O (1950 ml with inclusion fo free water flush regimen).   NUTRITION DIAGNOSIS:   Inadequate oral intake related to inability to eat as evidenced by NPO status.  Ongoing  GOAL:   Patient will meet greater than or equal to 90% of their needs  Met with TF  MONITOR:   Labs, Weight trends, Skin, I & O's, TF tolerance  REASON FOR ASSESSMENT:   Consult Enteral/tube feeding initiation and management  ASSESSMENT:   The patient is an 82 yrs old wm here with his family for evaluation and treatment of his fistula after a laryngectomy and radiation.  He had the fistula repaired but due to the radiation it broke down again.  We discussed the options of repair and he wishes to proceed with muscle coverage.  When he drinks it pours out of the fistula.  He is now getting nutrition through his G-tube.  S/p Procedure(s) on 05/04/16: PECTORALIS MAJOR MYOCUTANEOUS FLAP RECONSTRUCTION OF PHARYNX AND SPLIT THICKNESS SKIN GRAFT (Left) PECTORALIS FLAP to neck with possible, split thickness skin graft (Left)  Case discussed with RN, who reports pt is tolerating TF regimen well and has not refused any feedings since regimen has been changed. RN also reports she trialed clear liquids with pt, however, liquids went through stoma.   Labs reviewed.   Diet Order:  Diet NPO time specified  Skin:  Reviewed,  no issues  Last BM:  05/11/16  Height:   Ht Readings from Last 1 Encounters:  05/04/16 6' 3"  (1.905 m)    Weight:   Wt Readings from Last 1 Encounters:  05/11/16 191 lb 14.9 oz (87.1 kg)    Ideal Body Weight:  89.1 kg  BMI:  Body mass index is 23.99 kg/m.  Estimated Nutritional Needs:   Kcal:  2200-2400  Protein:  105-120 grams  Fluid:  >2.2 L  EDUCATION NEEDS:   No education needs identified at this time  Madelina Sanda A. Jimmye Norman, RD, LDN, CDE Pager: [phone] After hours Pager: [phone]

## 2016-05-11 NOTE — Care Management Important Message (Signed)
Important Message  Patient Details  Name: Timothy Suarez MRN: [id number] Date of Birth: Aug 20, 1924   Medicare Important Message Given:  Yes    Treniece Holsclaw 05/11/2016, 1:46 PM

## 2016-05-11 NOTE — Progress Notes (Signed)
Patient ID: Timothy Suarez, male   DOB: 1925/08/02, 82 y.o.   MRN: [id number]  Stable, no complaints. Stoma looks healthy and clear. Wet-to-dry packing continues. Wound granulating from the flap site. No infection. Continue wound care.

## 2016-05-11 NOTE — Progress Notes (Signed)
Physical Therapy Treatment Patient Details Name: Timothy Suarez MRN: [id number] DOB: [birth year] Today's Date: 05/11/2016    History of Present Illness Pt is an 82 y/o male presenting for pharyngocutaneous fistula repair now s/p muscle flap on 05/06/16. PMH including but not limited to A-fib, colon cancer, R vocal cord cancer, CAD, HTN, hx of R TKA in 2003 and hx of total larengectomy on 01/15/2016.    PT Comments    Pt performed increased mobility and performed stair training.  Pt progressed to gait with cane and presents with unsteadiness but good righting reactions observed.    Follow Up Recommendations  Supervision for mobility/OOB     Equipment Recommendations  None recommended by PT    Recommendations for Other Services       Precautions / Restrictions Precautions Precautions: Fall Restrictions Weight Bearing Restrictions: No    Mobility  Bed Mobility Overal bed mobility: Modified Independent             General bed mobility comments: No cueing needed performed with out assistance.    Transfers Overall transfer level: Needs assistance Equipment used: None Transfers: Sit to/from Stand Sit to Stand: Supervision         General transfer comment: close supervision for safety, PTA handed cane to patient once in standing.    Ambulation/Gait Ambulation/Gait assistance: Supervision Ambulation Distance (Feet): 300 Feet Assistive device: Straight cane Gait Pattern/deviations: Step-through pattern;Wide base of support;Decreased stride length;Shuffle Gait velocity: decreased   General Gait Details: Cues for forward gaze and upper trunk control.  Cues for pacing and endurance.    Stairs Stairs: Yes Stairs assistance: Min guard Stair Management: One rail Right;One rail Left;With cane Number of Stairs: 14 General stair comments: Pt performed with R rail descending and L rail ascending.  Cues for cane placement and step to pattern.    Wheelchair Mobility     Modified Rankin (Stroke Patients Only)       Balance Overall balance assessment: Needs assistance   Sitting balance-Leahy Scale: Fair       Standing balance-Leahy Scale: Fair                      Cognition Arousal/Alertness: Awake/alert Behavior During Therapy: WFL for tasks assessed/performed Overall Cognitive Status: Difficult to assess                      Exercises      General Comments        Pertinent Vitals/Pain Pain Assessment: No/denies pain    Home Living                      Prior Function            PT Goals (current goals can now be found in the care plan section) Acute Rehab PT Goals Patient Stated Goal: return home (to [facility]) Potential to Achieve Goals: Good Progress towards PT goals: Progressing toward goals    Frequency    Min 3X/week      PT Plan Current plan remains appropriate    Co-evaluation             End of Session   Activity Tolerance: Patient limited by fatigue Patient left: in chair;with call bell/phone within reach;with family/visitor present     Time: [id number] PT Time Calculation (min) (ACUTE ONLY): 27 min  Charges:  $Gait Training: 8-22 mins $Therapeutic Activity: 8-22 mins  G Codes:      Cristela Blue 05/11/2016, 4:51 PM  Governor Rooks, PTA pager [phone]

## 2016-05-12 NOTE — Progress Notes (Signed)
Patient ID: Timothy Suarez, male   DOB: August 11, 1924, 82 y.o.   MRN: [id number] Doing well, no new complaints. Packing changed. Wound is granulating. No signs of infection. I will talk with him and his daughter about possible nursing home placement now that he is very stable and really requires nutritional support and wound care.

## 2016-05-13 NOTE — Progress Notes (Signed)
Patient ID: Timothy Suarez, male   DOB: 27-Jul-1925, 82 y.o.   MRN: [id number] No new complaints. He appears very comfortable and does not appear to be in any distress or pain.  Packing changed. Wound clean and granulating from below from the pectoralis flap. No drainage or infection. Continue wound care.

## 2016-05-13 NOTE — Care Management Important Message (Signed)
Important Message  Patient Details  Name: Timothy Suarez MRN: [id number] Date of Birth: [birth year]   Medicare Important Message Given:  Yes    Tara Wich Abena 05/13/2016, 11:14 AM

## 2016-05-13 NOTE — Progress Notes (Signed)
Physical Therapy Treatment Patient Details Name: Timothy Suarez MRN: [id number] DOB: 07-24-1925 Today's Date: 05/13/2016    History of Present Illness Pt is an 82 y/o male presenting for pharyngocutaneous fistula repair now s/p muscle flap on 05/06/16. PMH including but not limited to A-fib, colon cancer, R vocal cord cancer, CAD, HTN, hx of R TKA in 2003 and hx of total larengectomy on 01/15/2016.    PT Comments    Pt performed higher level balance activities to improve function and challenge balance.  At this time all goals are met and patient in function with SPC.  Will inform supervising PT of patient progress and need to re-assess/update goals based on patients progress.   Follow Up Recommendations  Supervision for mobility/OOB     Equipment Recommendations  None recommended by PT    Recommendations for Other Services       Precautions / Restrictions Precautions Precautions: Fall Restrictions Weight Bearing Restrictions: No    Mobility  Bed Mobility               General bed mobility comments: Pt standing in room on arrival.    Transfers Overall transfer level: Modified independent     Sit to Stand: Modified independent (Device/Increase time)         General transfer comment: no assist need.  Goal met and will inform supervising PT of progress.    Ambulation/Gait Ambulation/Gait assistance: Supervision Ambulation Distance (Feet): 150 Feet Assistive device: Straight cane Gait Pattern/deviations: Step-through pattern;Wide base of support;Shuffle;Trunk flexed Gait velocity: decreased   General Gait Details: Cues for forward gaze and upper trunk control.  Cues for pacing and endurance. PTA offered challlenges during gait with cane including head, turns, stopping and starting and retro gait.  NO LOB noted.     Stairs            Wheelchair Mobility    Modified Rankin (Stroke Patients Only)       Balance     Sitting balance-Leahy Scale:  Normal       Standing balance-Leahy Scale: Good Standing balance comment: Good with cane for support, fair to poor with high level balance activities without device.               High level balance activites: Backward walking;Sudden stops;Turns;Direction changes;Head turns High Level Balance Comments: Pt also performed standing exercises to challenge balance with intermittent UE support.  pt performed SLS exercises to R and L and modified SLS to 6 inch step.  Pt performed modified SLS with eyes open/closed.  Pt able to tolerate 20-30 seconds at max with UE witout support tolerates 3-5 seconds.      Cognition Arousal/Alertness: Awake/alert Behavior During Therapy: WFL for tasks assessed/performed Overall Cognitive Status: Difficult to assess                      Exercises General Exercises - Lower Extremity Hip ABduction/ADduction: AROM;Both;10 reps;Standing Hip Flexion/Marching: AROM;Both;10 reps;Standing Heel Raises: AROM;Both;10 reps;Standing Mini-Sqauts: AROM;Both;10 reps;Standing    General Comments        Pertinent Vitals/Pain Pain Assessment: No/denies pain    Home Living                      Prior Function            PT Goals (current goals can now be found in the care plan section) Acute Rehab PT Goals Patient Stated Goal: return home (to [facility]) Potential to  Achieve Goals: Good Progress towards PT goals: PT to reassess next treatment    Frequency    Min 3X/week      PT Plan Current plan remains appropriate    Co-evaluation             End of Session   Activity Tolerance: Patient limited by fatigue Patient left: in chair;with call bell/phone within reach;with family/visitor present     Time: 1705-1720 PT Time Calculation (min) (ACUTE ONLY): 15 min  Charges:  $Therapeutic Activity: 8-22 mins                    G Codes:       J  05/13/2016, 5:29 PM   , PTA pager [phone]  

## 2016-05-13 NOTE — Care Management Note (Addendum)
Case Management Note  Patient Details  Name: Timothy Suarez MRN: [id number] Date of Birth: Feb 17, 1925  Subjective/Objective:                    Action/Plan:  Patient will need tube feedings at discharge and packing three times a day . Spoke with patient's daughter Timothy Suarez cell [phone] .  Discussed home health RN would need someone to teach how to do the packing three times a day . The RN would not be making daily visits.  Timothy Suarez stated she is not available to do packing three times a day and there is no one else who could. Timothy Suarez's husband recently had an amputation .  Discussed SNF . Patient has been at [facility] before, Timothy Suarez doesn't believe they were packing the wound correctly.  Timothy Suarez very upset  and is requesting to speak to Dr Constance Holster before making any decisions.   Spoke with Timothy Suarez at Dr Janeice Robinson office. Awaiting call back.  Spoke with Dr Constance Holster he will call Timothy Suarez in a few hours . Dr Constance Holster thinking family cannot do packing and patient needs to be in a SNF.  Called Timothy Suarez back , she is in agreement . SW Timothy Suarez aware and will call Timothy Suarez. Expected Discharge Date:                  Expected Discharge Plan:  Skilled Nursing Facility  In-House Referral:  Clinical Social Work  Discharge planning Services  CM Consult  Post Acute Care Choice:  Home Health Choice offered to:  Patient, Adult Children  DME Arranged:    DME Agency:     HH Arranged:    [place] Agency:     Status of Service:  In process, will continue to follow  If discussed at Long Length of Stay Meetings, dates discussed:    Additional Comments:  [name], RN 05/13/2016, 10:32 AM

## 2016-05-13 NOTE — Progress Notes (Signed)
Nutrition Follow-up  DOCUMENTATION CODES:   Not applicable  INTERVENTION:   -Continue bolus feedings of 250 ml of Jevity 1.5 5 times daily via PEG   30ml Prostat daily.   Tube feeding regimen provides 1975kcal (99% of needs), 95grams of protein, and 950ml of H2O.   -Recommendations for home TF:   Initiate bolus feedings of 250 ml of Jevity 1.5 5 times daily via PEG   30ml Prostat (or equivalent) daily.   Recommend 100 ml free water flush before and after each feeding administration  Tube feeding regimen provides 1975kcal (99% of needs), 95grams of protein, and 950ml of H2O (1950 ml with inclusion fo free water flush regimen).  NUTRITION DIAGNOSIS:   Inadequate oral intake related to inability to eat as evidenced by NPO status.  Ongoing  GOAL:   Patient will meet greater than or equal to 90% of their needs  Met with TF  MONITOR:   Labs, Weight trends, Skin, I & O's, TF tolerance  REASON FOR ASSESSMENT:   Consult Enteral/tube feeding initiation and management  ASSESSMENT:   The patient is an 82 yrs old wm here with his family for evaluation and treatment of his fistula after a laryngectomy and radiation.  He had the fistula repaired but due to the radiation it broke down again.  We discussed the options of repair and he wishes to proceed with muscle coverage.  When he drinks it pours out of the fistula.  He is now getting nutrition through his G-tube.  S/p Procedure(s) on 05/04/16: PECTORALIS MAJOR MYOCUTANEOUS FLAP RECONSTRUCTION OF PHARYNX AND SPLIT THICKNESS SKIN GRAFT (Left) PECTORALIS FLAP to neck with possible, split thickness skin graft (Left)  Pt sitting in recliner chair, smiling at time of visit. He denies any nausea or abdominal pain with TF. RN just administered bolus prior to RD visit.   Case discussed with RN, who reports pt is tolerating TF well and has not refused any doses. Discharge disposition likely SNF for ongoing wound care.    Labs reviewed: Glucose: 106.   Diet Order:  Diet NPO time specified  Skin:  Reviewed, no issues  Last BM:  05/11/16  Height:   Ht Readings from Last 1 Encounters:  05/04/16 6' 3" (1.905 m)    Weight:   Wt Readings from Last 1 Encounters:  05/12/16 193 lb 5.3 oz (87.7 kg)    Ideal Body Weight:  89.1 kg  BMI:  Body mass index is 24.16 kg/m.  Estimated Nutritional Needs:   Kcal:  2200-2400  Protein:  105-120 grams  Fluid:  >2.2 L  EDUCATION NEEDS:   No education needs identified at this time   A. , RD, LDN, CDE Pager: [phone] After hours Pager: [phone] 

## 2016-05-14 NOTE — NC FL2 (Signed)
[place] MEDICAID FL2 LEVEL OF CARE SCREENING TOOL     IDENTIFICATION  Patient Name: Timothy Suarez Birthdate: 1924/12/19 Sex: male Admission Date (Current Location): 05/04/2016  [facility] and [place] Number:  Herbalist and Address:  [facility] [place], [place] [address] [place], [place] [address]      Provider Number: [id number]  Attending Physician Name and Address:  Izora Gala, MD  Relative Name and Phone Number:  [name] Daughter [phone]  [phone]     Current Level of Care: Hospital Recommended Level of Care: [place] Prior Approval Number:    Date Approved/Denied:   PASRR Number:    Discharge Plan: Home    Current Diagnoses: Patient Active Problem List   Diagnosis Date Noted  . H/O laryngectomy 03/26/2016  . Protein-calorie malnutrition, severe 02/26/2016  . Pharyngocutaneous fistula 02/21/2016  . S/P laryngectomy 01/15/2016  . S/P biopsy 01/15/2016  . Vascular dementia with depressed mood 12/09/2015  . Baker's cyst of knee 12/09/2015  . Other fatigue 12/09/2015  . 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 Prostate cancer ([place]) 01/10/2015  . S/P MVR (mitral valve repair) 12/20/2014  . S/P Maze operation for atrial fibrillation 12/20/2014  . Obesity (BMI 30-39.9) 04/02/2014  . Prolonged Q-T interval on ECG 04/02/2014  . Mild cognitive impairment 04/02/2014  . Major depressive disorder, recurrent episode, mild ([place]) 02/08/2014  . Insomnia 02/08/2014  . Dizziness and giddiness 02/08/2014  . Generalized muscle weakness 02/08/2014  . Cerumen impaction 02/08/2014  . Glaucoma 02/08/2014  . Colon cancer ([place])   . Coronary artery disease   . Moderate aortic insufficiency 01/03/2013  . Mild mitral regurgitation 01/03/2013  . MVP (mitral valve prolapse)   . Paroxysmal atrial fibrillation (HCC)   . HTN (hypertension)   . Depression   . chest wall hernia (lung hernia)      Orientation RESPIRATION BLADDER Height & Weight     Self, Time, Situation, Place  Normal Continent Weight: 193 lb 5.3 oz (87.7 kg) Height:  6\' 3"  (190.5 cm)  BEHAVIORAL SYMPTOMS/MOOD NEUROLOGICAL BOWEL NUTRITION STATUS     (None) Continent  (PEG)  AMBULATORY STATUS COMMUNICATION OF NEEDS Skin   Limited Assist   Surgical wounds (Incision Closed: Left Chest Gauze and Neck moist to dry twice a day.)                       Personal Care Assistance Level of Assistance  Bathing, Feeding, Dressing Bathing Assistance: Limited assistance Feeding assistance: Independent Dressing Assistance: Limited assistance     Functional Limitations Info  Sight, Hearing, Speech Sight Info: Adequate Hearing Info: Adequate Speech Info: Impaired    SPECIAL CARE FACTORS FREQUENCY                       Contractures Contractures Info: Not present    Additional Factors Info  Allergies, Code Status Code Status Info: Full Allergies Info: Citalopram, Trazodone And Nefazodone, Zoloft Sertraline Hcl           Current Medications (05/14/2016):  This is the current hospital active medication list Current Facility-Administered Medications  Medication Dose Route Frequency Provider Last Rate Last Dose  . amLODipine (NORVASC) tablet 2.5 mg  2.5 mg Per Tube Daily Izora Gala, MD   2.5 mg at 05/14/16 1107  . atorvastatin (LIPITOR) tablet 40 mg  40 mg Oral q1800 Izora Gala, MD   40 mg  at 05/13/16 1728  . cholecalciferol (VITAMIN D) tablet 2,000 Units  2,000 Units Oral Daily Izora Gala, MD   2,000 Units at 05/14/16 1107  . feeding supplement (JEVITY 1.5 CAL/FIBER) liquid 250 mL  250 mL Per Tube 5 X Daily Izora Gala, MD   250 mL at 05/14/16 1107  . feeding supplement (PRO-STAT SUGAR FREE 64) liquid 30 mL  30 mL Per Tube Daily Izora Gala, MD   30 mL at 05/14/16 1107  . HYDROcodone-acetaminophen (HYCET) 7.5-325 mg/15 ml solution 10-15 mL  10-15 mL Per Tube Q4H PRN Izora Gala, MD   10 mL at  05/13/16 2221  . ibuprofen (ADVIL,MOTRIN) 100 MG/5ML suspension 400 mg  400 mg Per Tube Q6H PRN Izora Gala, MD      . polyethylene glycol (MIRALAX / GLYCOLAX) packet 17 g  17 g Per Tube Daily PRN Izora Gala, MD   17 g at 05/12/16 0608  . zolpidem (AMBIEN) tablet 5 mg  5 mg Per Tube QHS PRN Izora Gala, MD   5 mg at 05/13/16 2221     Discharge Medications: Please see discharge summary for a list of discharge medications.  Relevant Imaging Results:  Relevant Lab Results:   Additional Information SSN [id number]  Samule Dry, LCSW

## 2016-05-14 NOTE — Progress Notes (Signed)
05/14/2016 10:35 AM  Jarrett, Delfino Lovett [id number]  Post-Op Day 10    Temp:  [97.8 F (36.6 C)-98.4 F (36.9 C)] 98.4 F (36.9 C) (10/05 1000) Pulse Rate:  [66-76] 76 (10/05 1000) Resp:  [16-18] 16 (10/05 1000) BP: (110-137)/(57-69) 110/57 (10/05 1000) SpO2:  [96 %-100 %] 98 % (10/05 1000),     Intake/Output Summary (Last 24 hours) at 05/14/16 1035 Last data filed at 05/14/16 0526  Gross per 24 hour  Intake              750 ml  Output              610 ml  Net              140 ml    No results found for this or any previous visit (from the past 24 hour(s)).  SUBJECTIVE:  Min pain.  Not yet using [facility] valve.   OBJECTIVE:  Stoma clean and patent.  Still visible leakage from wound. Flap pink and viable.  IMPRESSION:  PC fistula.    PLAN:  Possible rehab center disposition.  Will ask Speech to assist with PMV.    Jodi Marble

## 2016-05-14 NOTE — Evaluation (Signed)
Speech Language Pathology Evaluation Patient Details Name: ROBBE MCCASTER MRN: [id number] DOB: 1925-02-14 Today's Date: 05/14/2016 Time: [id number] SLP Time Calculation (min) (ACUTE ONLY): 33 min  Problem List:  Patient Active Problem List   Diagnosis Date Noted  . H/O laryngectomy 03/26/2016  . Protein-calorie malnutrition, severe 02/26/2016  . Pharyngocutaneous fistula 02/21/2016  . S/P laryngectomy 01/15/2016  . S/P biopsy 01/15/2016  . Vascular dementia with depressed mood 12/09/2015  . Baker's cyst of knee 12/09/2015  . Other fatigue 12/09/2015  . 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 Prostate cancer ([place]) 01/10/2015  . S/P MVR (mitral valve repair) 12/20/2014  . S/P Maze operation for atrial fibrillation 12/20/2014  . Obesity (BMI 30-39.9) 04/02/2014  . Prolonged Q-T interval on ECG 04/02/2014  . Mild cognitive impairment 04/02/2014  . Major depressive disorder, recurrent episode, mild ([place]) 02/08/2014  . Insomnia 02/08/2014  . Dizziness and giddiness 02/08/2014  . Generalized muscle weakness 02/08/2014  . Cerumen impaction 02/08/2014  . Glaucoma 02/08/2014  . Colon cancer ([place])   . Coronary artery disease   . Moderate aortic insufficiency 01/03/2013  . Mild mitral regurgitation 01/03/2013  . MVP (mitral valve prolapse)   . Paroxysmal atrial fibrillation (HCC)   . HTN (hypertension)   . Depression   . chest wall hernia (lung hernia)    Past Medical History:  Past Medical History:  Diagnosis Date  . A-fib ([place])   . Aortic valve insufficiency, acquired   . Arthritis   . BPH (benign prostatic hyperplasia)   . Cerebral ischemia   . Colon cancer [facility])     s/p partial colectomy  . Complication of anesthesia    "woke up w/confusion and hallucinations once after mitral valve OR"  . Coronary artery disease    50-70% LAD mitral valve repair 4+ yrs ago  . Depression   . Dyslipidemia   . Glaucoma   . Heart murmur   . Hernia    . High cholesterol   . History of echocardiogram    Echo 5/16:  Mild LVH, EF 50-55%, Gr 1 DD, septal HK, Ao sclerosis without stenosis, mild AI, MV repair ok with borderline mild MS (mean 4 mmHg), mild MR, mild LAE, mod RAE, mild TR, PASP 30 mmHg  . HTN (hypertension)    takes Amlodipine daily  . Hx of cardiovascular stress test    Lexiscan Myoview 5/16:  Apical thinning, EF not gated, no ischemia. Low Risk  . Hyperlipidemia   . Major depression   . Mild cognitive impairment   . MVP (mitral valve prolapse)    S/P Rt mini thoractomy for Mitral Valve repair  . Nocturia   . Paroxysmal atrial fibrillation (HCC)    S/P Maze procedure  . 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 Prostate cancer ([place])   . 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 Shortness of breath dyspnea    with exertion  . Sleep disturbance   . Thoracic aortic aneurysm ([place])   . Urinary frequency   . Urinary urgency    Past Surgical History:  Past Surgical History:  Procedure Laterality Date  . CARDIAC CATHETERIZATION    . CATARACT EXTRACTION W/ INTRAOCULAR LENS  IMPLANT, BILATERAL Bilateral   . COLECTOMY  2004   Dr [name]  . DIRECT LARYNGOSCOPY N/A 01/15/2016   Procedure: DIRECT LARYNGOSCOPY WITH BIOPSY AND FROZEN SECTION;  Surgeon: Izora Gala,  MD;  Location: [facility] OR;  Service: ENT;  Laterality: N/A;  . FlexHD patch repair of chest wall hernia.  01/28/2011   [name]  . GASTROSTOMY W/ FEEDING TUBE    . JOINT REPLACEMENT    . LARYNGETOMY N/A 01/15/2016   Procedure:  TOTAL LARYNGECTOMY;  Surgeon: Izora Gala, MD;  Location: [facility] OR;  Service: ENT;  Laterality: N/A;  . MAZE  12/27/2008   left side lesion set  . MICROLARYNGOSCOPY Right 01/29/2015   Procedure: MICROLARYNGOSCOPY WITH BIOSPY OF RIGHT VOCAL CORD;  Surgeon: Izora Gala, MD;  Location: [place];  Service: ENT;  Laterality: Right;  . MITRAL VALVE REPAIR  12/27/2008   complex valvuloplasty with 42mm  Memo 3D annuloplasty ring via right minithoracotomy  . PECTORALIS FLAP Left 05/04/2016   Procedure: PECTORALIS FLAP to neck with possible, split thickness skin graft;  Surgeon: Loel Lofty Dillingham, DO;  Location: [place];  Service: Plastics;  Laterality: Left;  . PROSTATE BIOPSY    . SKIN SPLIT GRAFT Left 05/04/2016   Procedure: PECTORALIS MAJOR MYOCUTANEOUS FLAP RECONSTRUCTION OF PHARYNX AND SPLIT THICKNESS SKIN GRAFT;  Surgeon: Izora Gala, MD;  Location: [place];  Service: ENT;  Laterality: Left;  . TEE WITHOUT CARDIOVERSION N/A 01/03/2013   Procedure: TRANSESOPHAGEAL ECHOCARDIOGRAM (TEE);  Surgeon: Sueanne Margarita, MD;  Location: [place];  Service: Cardiovascular;  Laterality: N/A;  . TOTAL KNEE ARTHROPLASTY Right 2003  . TRACHEAL ESOPHAGEAL PUNCTURE REPAIR N/A 03/26/2016   Procedure: TRACHEAL ESOPHAGEAL PUNCTURE;  Surgeon: Izora Gala, MD;  Location: [place];  Service: ENT;  Laterality: N/A;  . TRACHEOESOPHAGEAL FISTULA REPAIR N/A 03/26/2016   Procedure: TRACHEO-ESOPHAGEAL   PUNCTURE,FISTULAR CLOSURE;  Surgeon: Izora Gala, MD;  Location: [place];  Service: ENT;  Laterality: N/A;  . TRACHEOESOPHAGEAL FISTULA REPAIR N/A 04/09/2016   Procedure: FISTULA REPAIR WITH  MUSCLE ROTATION FLAP;  Surgeon: Izora Gala, MD;  Location: [place];  Service: ENT;  Laterality: N/A;  . TYMPANOPLASTY  1967   "? side"   HPI:  Pt is an 82 y/o male presenting for pharyngocutaneous fistula repair now s/p muscle flap on 05/06/16. PMH including but not limited to A-fib, colon cancer, R vocal cord cancer, CAD, HTN, hx of R TKA in 2003 and hx of total larengectomy on 01/15/2016. Pt also s/p TEP in the interim.   Assessment / Plan / Recommendation Clinical Impression  Pt seen per MD request for education pertaining to TEP. Pt had voice prosthesis in place with trace, wet secretions in surrounding area. Pt intermittently coughed and cleared secretions from stoma with Mod I. Pt says that he has seen several SLPs, but it is unclear if  he has seen one since initial placement of his prosthesis. He says that he did not know that it was for voicing and that he does not do anything to clean or maintain it, yet outwardly it looks like it is in good condition. SLP provided education including visual aids to explain the prosthesis as well as how it can be utilized and how to care for it over time. Pt primarily communicates via writing, with minimal amounts of clarification needed due to spelling errors. Pt will need SLP f/u upon d/c to maximize functional communication. Ideally this will be someone who has familiarity with how to maintain the prosthesis, as he will need someone long-term who can exchange it PRN.    SLP Assessment  Patient needs continued Speech Lanaguage Pathology Services    Follow Up Recommendations  Outpatient SLP  Frequency and Duration min 1 x/week  One additional visit      SLP Evaluation Cognition          Comprehension       Expression Expression Primary Mode of Expression: Nonverbal - written Verbal Expression Overall Verbal Expression: Impaired Non-Verbal Means of Communication: Writing;Gestures   Oral / Motor  Motor Speech Overall Motor Speech: Impaired Phonation: Aphonic (secondary to laryngectomy)   GO                    Germain Osgood 05/14/2016, 4:48 PM   Germain Osgood, M.A. CCC-SLP [phone]

## 2016-05-14 NOTE — Clinical Social Work Note (Signed)
Patient has a bed available with [facility] when medically stable for discharge. CSW will continue to follow for discharge needs.   [employer], LCSW [phone]

## 2016-05-15 MED ORDER — HYDROCODONE-ACETAMINOPHEN 7.5-325 MG/15ML PO SOLN: 10.0000 mL | ORAL | 0 refills | Status: DC | PRN

## 2016-05-15 MED ORDER — JEVITY 1.5 CAL/FIBER PO LIQD: 250.0000 mL | Freq: Every day | ORAL | Status: DC

## 2016-05-15 MED ORDER — PRO-STAT SUGAR FREE PO LIQD: 30.0000 mL | Freq: Every day | ORAL | 0 refills | Status: DC

## 2016-05-15 NOTE — Progress Notes (Signed)
Patient discharged to [facility], report was given to nurse Marjorie Smolder. Patient transported by [facility].

## 2016-05-15 NOTE — Progress Notes (Signed)
Attempted to call report to [facility] with no answer. 

## 2016-05-15 NOTE — Progress Notes (Signed)
I was notified that patient may not discharge as per SW. Tried to call SW Derrill Kay  and left messages to update me. Still waiting for her to call back.

## 2016-05-15 NOTE — Progress Notes (Signed)
Coverage Note  Dr Erik Obey is not able to respond to need for Rx on TF and pain meds and to match AVS and d/c summary as he is in emergencies in his clinic.  I have seen and reviewed the d/c plan  The pain medication and TF Rx were not on the AVS but are on d/c summary  Also the SNF needs a written Rx for pain med  I matched AVS to d/c summary and rx the pain med .   Saralyn Pilar WrightMD  Cell  [phone]

## 2016-05-15 NOTE — Discharge Instructions (Signed)
G tube feeding Wet-wet wound packing bid Stoma hygiene Speech Path to work with TEP prosthesis Recheck Dr. Constance Holster 2 weeks  [phone]

## 2016-05-15 NOTE — Discharge Summary (Addendum)
05/15/2016 9:51 AM  Timothy Suarez, Timothy Suarez [id number]  Post-Op Day 10  Discharge summary    Temp:  [97.5 F (36.4 C)-98.4 F (36.9 C)] 97.5 F (36.4 C) (10/06 0450) Pulse Rate:  [70-76] 76 (10/06 0450) Resp:  [16-18] 18 (10/06 0450) BP: (107-145)/(50-57) 145/56 (10/06 0450) SpO2:  [98 %-99 %] 98 % (10/06 0450) Weight:  [87.5 kg (192 lb 14.4 oz)] 87.5 kg (192 lb 14.4 oz) (10/06 0450),     Intake/Output Summary (Last 24 hours) at 05/15/16 0951 Last data filed at 05/15/16 0451  Gross per 24 hour  Intake               60 ml  Output              640 ml  Net             -580 ml   JP drain minimal and removed.   No results found for this or any previous visit (from the past 24 hour(s)).  SUBJECTIVE:  Min pain.    OBJECTIVE:  Flap viable.  Large avascular cavity with persistent superior lateral pharyngocutaneous fistula.    IMPRESSION:  Satisfactory check.  Persistent PC fistula which will have to granulate in  PLAN:  Discharge to skilled nursing facility for G tube feeding, wound care, Speech Pathology assistance.  Admit:  25 SEP Discharge:  6 OCT Final Diagnosis:  pharyngocutaneous fistula s/p salvage laryngectomy Proc: pec major myocutaneous flap, skin graft 25 SEP Comp:  Wound dehiscence with persistent pharyngocutaeous fistula Cond:  Drains out.  G tube feeding.  Fistula treated with bid dressings.  Min pain Recheck:  Dr. Constance Suarez 2 weeks. Rx:  None instructions written and given  Hosp Course:  Under went flap repair on day of admission.  Wound dehiscence noted on POD 6.  Flap viable.  Wet-wet dressings to prevent aspiration into laryngectomy stoma.  Speech Path consulted on POD 9 for assistance with TEP prosthesis and voice development.  Discharge to skilled nursing on POD 10.    Timothy Suarez  AddendumJodi Marble MD, acting Unit Secretary, acting Social Worker submitting a list of medications which are comprehensively available on the chart for redundant   unspecified requirements of Skilled Nursing Facility placement:  Current Medications:  Amlodipine 2.5 mg per G tube qD Atorvastatin 40 mg per G tube qD Cholecalciferol 2000 units per G tube qD Jevity Tube Feeding, 1.5 Cal/Fiber, 250 ml per G tube 5 x daily Pro Stat Sugar Free Supplement 30 ml per G tube qD Hydrocodone, 10-15 ml per G tube q4h prn pain Ibuprofen, 400 mg per G tube  q6h prn pain Miralax 17 gm per G tube qD prn constipation Ambien 5 mg per G tube qhs prn sleep

## 2016-05-15 NOTE — Clinical Social Work Placement (Addendum)
   CLINICAL SOCIAL WORK PLACEMENT  NOTE  Date:  05/15/2016  Patient Details  Name: Timothy Suarez MRN: [id number] Date of Birth: 1925/01/25  Clinical Social Work is seeking post-discharge placement for this patient at the [place] level of care (*CSW will initial, date and re-position this form in  chart as items are completed):  Yes   Patient/family provided with [facility] Work Department's list of facilities offering this level of care within the geographic area requested by the patient (or if unable, by the patient's family).  Yes   Patient/family informed of their freedom to choose among providers that offer the needed level of care, that participate in Medicare, Medicaid or managed care program needed by the patient, have an available bed and are willing to accept the patient.      Patient/family informed of [facility]'s ownership interest in [facility] and [facility], as well as of the fact that they are under no obligation to receive care at these facilities.  PASRR submitted to EDS on       PASRR number received on       Existing PASRR number confirmed on 05/15/16     FL2 transmitted to all facilities in geographic area requested by pt/family on 05/15/16     FL2 transmitted to all facilities within larger geographic area on       Patient informed that his/her managed care company has contracts with or will negotiate with certain facilities, including the following:            Patient/family informed of bed offers received.  Patient chooses bed at [facility] recommends and patient chooses bed at      Patient to be transferred to [facility] on 05/15/16.  Patient to be transferred to facility by Ambulance     Patient family notified on 05/15/16 of transfer.  Name of family member notified:  Black,Debra     PHYSICIAN Please prepare priority discharge summary, including  medications, Please prepare prescriptions, Please sign FL2     Additional Comment:  Per MD patient is ready to discharge to [facility]. RN, patient, patient's family, and facility notified of discharge. RN given phone number for report [phone].  Transport packet is on patient's chart. Ambulance transport requested. CSW signing off.  _______________________________________________ Samule Dry, LCSW 05/15/2016, 11:43 AM

## 2016-05-19 ENCOUNTER — Encounter: Payer: Self-pay | Admitting: Internal Medicine

## 2016-05-19 ENCOUNTER — Non-Acute Institutional Stay (SKILLED_NURSING_FACILITY): Payer: Medicare Other | Admitting: Internal Medicine

## 2016-05-19 DIAGNOSIS — C320 Malignant neoplasm of glottis: Secondary | ICD-10-CM | POA: Diagnosis not present

## 2016-05-19 DIAGNOSIS — I480 Paroxysmal atrial fibrillation: Secondary | ICD-10-CM

## 2016-05-19 DIAGNOSIS — J392 Other diseases of pharynx: Secondary | ICD-10-CM

## 2016-05-19 DIAGNOSIS — I10 Essential (primary) hypertension: Secondary | ICD-10-CM

## 2016-05-19 NOTE — Patient Instructions (Signed)
Wound care nurse will continue the dressings as per Dr. Constance Holster

## 2016-05-19 NOTE — Assessment & Plan Note (Addendum)
Wound care nurse will continue the dressings Monitor for signs of purulence or aspiration.

## 2016-05-19 NOTE — Assessment & Plan Note (Signed)
BP controlled; no change in antihypertensive medications  

## 2016-05-19 NOTE — Assessment & Plan Note (Signed)
Clinically he is not in atrial fib at this time Last EKG in July revealed a sinus arrhythmia with first-degree AV block

## 2016-05-19 NOTE — Progress Notes (Signed)
Facility Location: [facility]  Room Number: 210  Code Status: Full Code   PCP: REED, TIFFANY, [place] [address]  This is a comprehensive admission note to [facility] performed on this date less than 30 days from date of admission. Included are preadmission medical/surgical history;reconciled medication list; family history; social history and comprehensive review of systems.  Corrections and additions to the records were documented . Comprehensive physical exam was also performed. Additionally a clinical summary was entered for each active diagnosis pertinent to this admission in the Problem List to enhance continuity of care.   HPI: The patient was hospitalized 9/25-10/6/17 for repair of a surgical flap with donor graft from the chest was complicated by wound dehiscence postop day 6. The patient was to be treated with wet-wet dressings to maintain viability of the flap. This was actually the latest of his surgical procedures related to his malignancy and its treatment. 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 He received radiation therapy until 04/04/15. The malignancy recurred in April of this year. In May of this year he had a salvage laryngectomy. Several weeks later he developed a fistula. Repair with a donor graft from the posterior neck was unsuccessful.  Past medical and surgical history is long and complicated has history of thoracic aortic aneurysm. He also has prostate cancer. He apparently may have received Lupron injections but these were discontinued. No further treatment is recommended. He has had mitral valve repair. This was associated with postop confusion & hallucinations postop.  Social history: Despite the history of vocal cord cancer, he has never smoked. He does drink socially.  Family history: Reviewed  Review of systems: The major symptom is copious  secretions from the fistula which are reported as being nonpurulent. He has no associated constitutional symptoms. Constitutional: No fever,significant weight change, fatigue  Eyes: No redness, discharge, pain, vision change ENT/mouth: No nasal congestion,  purulent discharge, earache,change in hearing ,sore throat  Cardiovascular: No chest pain, palpitations,paroxysmal nocturnal dyspnea, claudication, edema  Respiratory: No cough, sputum production,hemoptysis, DOE , significant snoring,apnea  Gastrointestinal: No heartburn,dysphagia,abdominal pain, nausea / vomiting,rectal bleeding, melena,change in bowels Genitourinary: No dysuria,hematuria, pyuria,  incontinence, nocturia Musculoskeletal: No joint stiffness, joint swelling, weakness,pain Dermatologic: No rash, pruritus, change in appearance of skin Neurologic: No dizziness,headache,syncope, seizures, numbness , tingling Psychiatric: No significant anxiety , depression,  anorexia. By history he has been intolerant to Ambien that he is receiving at this time without apparent adverse effect. Endocrine: No change in hair/skin/ nails, excessive thirst, excessive hunger, excessive urination  Hematologic/lymphatic: No significant bruising, lymphadenopathy,abnormal bleeding Allergy/immunology: No itchy/ watery eyes, significant sneezing, urticaria, angioedema  Physical exam:  Pertinent or positive findings: He is very alert and communicative although he is hard of hearing. He communicates by writing. He has pattern alopecia. He has a mustache. Tracheostomy hole is present. The left chest at the site of the donor graft was dressed. Rhythm is slightly irregular ; he has an occasional premature beat. Mild rhonchi are present mainly over the upper airway. A PEG is in place. There is reverse curvature of the lower lumbosacral spine. He has myriad keratoses over the back.   General appearance:Adequately nourished; no acute distress , increased work of  breathing is present.   Lymphatic: No lymphadenopathy about the head, neck, axilla . Eyes: No conjunctival inflammation or lid edema is present. There is no scleral icterus.  Ears:  External ear exam shows no significant lesions or deformities.   Nose:  External nasal examination shows no deformity or inflammation. Nasal mucosa are pink and moist without lesions ,exudates Oral exam: lips and gums are healthy appearing.There is no oropharyngeal erythema or exudate . Neck:  No tenderness noted.    Heart:  No gallop, murmur, click, rub .  Abdomen:Bowel sounds are normal. Abdomen is soft and nontender with no organomegaly, hernias,masses. GU: deferred as previously addressed. Extremities:  No cyanosis, clubbing,edema  Neurologic exam : Strength equal  in upper & lower extremities Deep tendon reflexes are equal Skin: Warm & dry w/o tenting. No significant lesions or rash.  See clinical summary under each active problem in the Problem List with associated updated therapeutic plan

## 2016-05-25 DIAGNOSIS — H9113 Presbycusis, bilateral: Secondary | ICD-10-CM | POA: Insufficient documentation

## 2016-06-04 ENCOUNTER — Encounter: Payer: Self-pay | Admitting: Internal Medicine

## 2016-06-04 ENCOUNTER — Non-Acute Institutional Stay (SKILLED_NURSING_FACILITY): Payer: Medicare Other | Admitting: Internal Medicine

## 2016-06-04 DIAGNOSIS — E43 Unspecified severe protein-calorie malnutrition: Secondary | ICD-10-CM | POA: Diagnosis not present

## 2016-06-04 DIAGNOSIS — G4700 Insomnia, unspecified: Secondary | ICD-10-CM

## 2016-06-04 DIAGNOSIS — C320 Malignant neoplasm of glottis: Secondary | ICD-10-CM

## 2016-06-04 NOTE — Progress Notes (Signed)
   The patient is being discharged from [facility] on this date by Unice Cobble MD.  [id number] Reed DO  The medical history in this facility was reviewed and summarized and medical problem list was updated. Time spent and note content is documented as follows.  Summary of [facility] medical records: He was admitted to the [facility] 05/15/16 following a surgical flap repair complicated by wound dehiscence.He was admitted to SNF for ongoing dressing changes to maintain viability of the flap. 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 Additional surgery was necessary despite radiation therapy which ended in August 2016 because of recurrence in April this year. In May he had salvage laryngectomy, complicated by a fistula several weeks later. Repair with a donor graft from the posterior neck was unsuccessful but the present.graft from the chest appeared to be viable at the time of discharge. He was able to successfully apply the dressings himself after initial instruction. Additionally he's been administering his tube feedings as well. He saw Dr. Constance Holster, ENT 10/24. Dr. Constance Holster felt the wound was closing well without evidence of complication.  Review of systems: He denies any active upper or lower respiratory tract symptoms or cardiac, GI, genitourinary symptoms. He had been receiving Ambien well at the SNF. According to his daughter this was associated with mental status changes during one of his hospitalizations. Negative [id number]: No fever,significant weight change, fatigue  Eyes: No redness, discharge, pain, vision change ENT/mouth: No nasal congestion,  purulent discharge, earache,sore throat  Cardiovascular: No chest pain, palpitations,paroxysmal nocturnal dyspnea, claudication, edema  Respiratory: No cough, sputum production,hemoptysis, DOE , significant snoring,apnea   Gastrointestinal: No  heartburn,dysphagia,abdominal pain, nausea / vomiting,rectal bleeding, melena,change in bowels Genitourinary: No dysuria,hematuria, pyuria,  incontinence, nocturia Dermatologic: No rash, pruritus, change in appearance of skin Hematologic/lymphatic: No significant bruising, lymphadenopathy,abnormal bleeding Allergy/immunology: No itchy/ watery eyes, significant sneezing, urticaria, angioedema  Physical exam:  Pertinent or positive findings: He appears younger than his stated age. He has pattern alopecia and a mustache. He does have very mild rhonchi over the upper airway due to turbulent airflow. Second heart sound is increased. Feeding tube present percutaneously. Pedal pulses are surprisingly good General appearance:Adequately nourished; no acute distress , increased work of breathing is present.   Lymphatic: No lymphadenopathy about the head, neck, axilla  Eyes: No conjunctival inflammation or lid edema is present. There is no scleral icterus. Ears:  External ear exam shows no significant lesions or deformities.   Nose:  External nasal examination shows no deformity or inflammation. Nasal mucosa are pink and moist without lesions ,exudates Oral exam: lips and gums are healthy appearing.There is no oropharyngeal erythema or exudate . Neck:  No thyromegaly, masses, tenderness noted.    Heart:  Normal rate and regular rhythm. S1  normal without gallop, murmur, click, rub .  Lungs:Chest clear to auscultation without wheezes, rhonchi,rales , rubs other than upper airway turbulence. Abdomen:Bowel sounds are normal. Abdomen is soft and nontender with no organomegaly, hernias,masses. Extremities:  No cyanosis, clubbing,edema  Neurologic exam : Strength equal  in upper & lower extremities Skin: Warm & dry w/o tenting. No significant lesions or rash.  See clinical summary of Discharge Diagnoses in the Problem List with associated updated therapeutic plan  Discharge instructions were written and  discharge instructions provided. Follow-up will be by Dr. Constance Holster as scheduled

## 2016-06-04 NOTE — Patient Instructions (Signed)
Wound care will consist of saline impregnated gauze twice a day covered by dry dressing. Patient and daughter and [place] Nurse express comfort with him dressing the wound. Nutrition as Isosorb 2 cans 3 times a day through the feeding tube, administered by the patient.

## 2016-06-04 NOTE — Assessment & Plan Note (Signed)
Wound care will consist of packing with saline impregnated gauze covered by dry dressing at least twice a day.

## 2016-06-04 NOTE — Assessment & Plan Note (Signed)
Isosorb 2 cans 3 times a day through the feeding tube administered by the patient

## 2016-06-04 NOTE — Assessment & Plan Note (Addendum)
Risk associated with Ambien as per [place]' list discussed with patient and daughter Melatonin 3 mg 30-60 minutes before bedtime

## 2016-06-19 ENCOUNTER — Encounter: Payer: Self-pay | Admitting: Internal Medicine

## 2016-06-19 ENCOUNTER — Ambulatory Visit (INDEPENDENT_AMBULATORY_CARE_PROVIDER_SITE_OTHER): Payer: Medicare Other | Admitting: Internal Medicine

## 2016-06-19 VITALS — BP 130/66 | HR 83 | Temp 98.2°F | Ht 75.0 in | Wt 177.4 lb

## 2016-06-19 DIAGNOSIS — J392 Other diseases of pharynx: Secondary | ICD-10-CM | POA: Diagnosis not present

## 2016-06-19 DIAGNOSIS — E43 Unspecified severe protein-calorie malnutrition: Secondary | ICD-10-CM | POA: Diagnosis not present

## 2016-06-19 DIAGNOSIS — M7122 Synovial cyst of popliteal space [Baker], left knee: Secondary | ICD-10-CM | POA: Diagnosis not present

## 2016-06-19 DIAGNOSIS — C320 Malignant neoplasm of glottis: Secondary | ICD-10-CM

## 2016-06-19 DIAGNOSIS — I10 Essential (primary) hypertension: Secondary | ICD-10-CM | POA: Diagnosis not present

## 2016-06-19 DIAGNOSIS — E785 Hyperlipidemia, unspecified: Secondary | ICD-10-CM | POA: Diagnosis not present

## 2016-06-19 DIAGNOSIS — G4700 Insomnia, unspecified: Secondary | ICD-10-CM | POA: Diagnosis not present

## 2016-06-19 DIAGNOSIS — Z23 Encounter for immunization: Secondary | ICD-10-CM | POA: Diagnosis not present

## 2016-06-19 MED ORDER — ATORVASTATIN CALCIUM 40 MG PO TABS: ORAL_TABLET | ORAL | 1 refills | Status: DC

## 2016-06-19 MED ORDER — ZOLPIDEM TARTRATE 5 MG PO TABS: 5.0000 mg | ORAL_TABLET | Freq: Every evening | ORAL | 1 refills | Status: DC | PRN

## 2016-06-19 MED ORDER — AMLODIPINE BESYLATE 2.5 MG PO TABS: 2.5000 mg | ORAL_TABLET | Freq: Every day | ORAL | 1 refills | Status: DC

## 2016-06-19 NOTE — Progress Notes (Signed)
Location:  [facility] clinic Provider: Aianna Fahs L. Mariea Clonts, D.O., C.M.D.  Code Status: DNR Goals of Care:  Advanced Directives 06/04/2016  Does patient have an advance directive? Yes  Type of Advance Directive (No Data)  Does patient want to make changes to advanced directive? No - Patient declined  Copy of advanced directive(s) in chart? Yes  Pre-existing out of facility DNR order (yellow form or pink MOST form) -     Chief Complaint  Patient presents with  . Acute Visit    Left knee pain (unable to speak today)   . Medication Refill    RX request for prostat, currently on med list (not taking x 6 months)     HPI: Patient is a 82 y.o. male seen today for an acute visit for left knee pain.  Unfortunately, I have not seen Timothy Suarez since his laryngectomy.  I have been following via the EMR.  Since then, he developed a fistula and has had two more surgeries for it including a flap done by plastic surgery.  We communicated with him using a dry erase board today and some lip reading.  He would like to wait to get his baker's cyst left knee aspirated but wants to wait until he is "back to normal" to do so.  Last time it was successful, it was done by Dr. Annamaria Boots at [facility]  He will call when he wants this again.  He has lost 30 lbs.  He is still on tube feeding with prostat.  He says this is not the way he wants to live.  Denies suicidal ideation.  He is frustrated about how long it is taking for him to heal. He is happy that he's had no further recurrence of the cancer.     Past Medical History:  Diagnosis Date  . A-fib ([place])   . Aortic valve insufficiency, acquired   . Arthritis   . BPH (benign prostatic hyperplasia)   . Cerebral ischemia   . Colon cancer [facility])     s/p partial colectomy  . Complication of anesthesia    "woke up w/confusion and hallucinations once after mitral valve OR"  . Coronary artery disease    50-70% LAD mitral valve repair 4+ yrs ago  . Depression     . Dyslipidemia   . Glaucoma   . Heart murmur   . Hernia   . High cholesterol   . History of echocardiogram    Echo 5/16:  Mild LVH, EF 50-55%, Gr 1 DD, septal HK, Ao sclerosis without stenosis, mild AI, MV repair ok with borderline mild MS (mean 4 mmHg), mild MR, mild LAE, mod RAE, mild TR, PASP 30 mmHg  . HTN (hypertension)    takes Amlodipine daily  . Hx of cardiovascular stress test    Lexiscan Myoview 5/16:  Apical thinning, EF not gated, no ischemia. Low Risk  . Hyperlipidemia   . Major depression   . Mild cognitive impairment   . MVP (mitral valve prolapse)    S/P Rt mini thoractomy for Mitral Valve repair  . Nocturia   . Paroxysmal atrial fibrillation (HCC)    S/P Maze procedure  . 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 Prostate cancer ([place])   . 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 Shortness of breath dyspnea    with exertion  . Sleep disturbance   . Thoracic aortic aneurysm ([place])   .  Urinary frequency   . Urinary urgency     Past Surgical History:  Procedure Laterality Date  . CARDIAC CATHETERIZATION    . CATARACT EXTRACTION W/ INTRAOCULAR LENS  IMPLANT, BILATERAL Bilateral   . COLECTOMY  2004   Dr [name]  . DIRECT LARYNGOSCOPY N/A 01/15/2016   Procedure: DIRECT LARYNGOSCOPY WITH BIOPSY AND FROZEN SECTION;  Surgeon: Izora Gala, MD;  Location: [place];  Service: ENT;  Laterality: N/A;  . FlexHD patch repair of chest wall hernia.  01/28/2011   [name]  . GASTROSTOMY W/ FEEDING TUBE    . JOINT REPLACEMENT    . LARYNGETOMY N/A 01/15/2016   Procedure:  TOTAL LARYNGECTOMY;  Surgeon: Izora Gala, MD;  Location[facility];  Service: ENT;  Laterality: N/A;  . MAZE  12/27/2008   left side lesion set  . MICROLARYNGOSCOPY Right 01/29/2015   Procedure: MICROLARYNGOSCOPY WITH BIOSPY OF RIGHT VOCAL CORD;  Surgeon: Izora Gala, MD;  Location: [place];  Service: ENT;  Laterality: Right;  . MITRAL VALVE REPAIR   12/27/2008   complex valvuloplasty with 13mm Memo 3D annuloplasty ring via right minithoracotomy  . PECTORALIS FLAP Left 05/04/2016   Procedure: PECTORALIS FLAP to neck with possible, split thickness skin graft;  Surgeon: Loel Lofty Dillingham, DO;  Location: [place];  Service: Plastics;  Laterality: Left;  . PROSTATE BIOPSY    . SKIN SPLIT GRAFT Left 05/04/2016   Procedure: PECTORALIS MAJOR MYOCUTANEOUS FLAP RECONSTRUCTION OF PHARYNX AND SPLIT THICKNESS SKIN GRAFT;  Surgeon: Izora Gala, MD;  Location: [place];  Service: ENT;  Laterality: Left;  . TEE WITHOUT CARDIOVERSION N/A 01/03/2013   Procedure: TRANSESOPHAGEAL ECHOCARDIOGRAM (TEE);  Surgeon: Sueanne Margarita, MD;  Location: [place];  Service: Cardiovascular;  Laterality: N/A;  . TOTAL KNEE ARTHROPLASTY Right 2003  . TRACHEAL ESOPHAGEAL PUNCTURE REPAIR N/A 03/26/2016   Procedure: TRACHEAL ESOPHAGEAL PUNCTURE;  Surgeon: Izora Gala, MD;  Location: [place];  Service: ENT;  Laterality: N/A;  . TRACHEOESOPHAGEAL FISTULA REPAIR N/A 03/26/2016   Procedure: TRACHEO-ESOPHAGEAL   PUNCTURE,FISTULAR CLOSURE;  Surgeon: Izora Gala, MD;  Location: [place];  Service: ENT;  Laterality: N/A;  . TRACHEOESOPHAGEAL FISTULA REPAIR N/A 04/09/2016   Procedure: FISTULA REPAIR WITH  MUSCLE ROTATION FLAP;  Surgeon: Izora Gala, MD;  Location: [place];  Service: ENT;  Laterality: N/A;  . TYMPANOPLASTY  1967   "? side"    Allergies  Allergen Reactions  . Ambien [Zolpidem] Other (See Comments)    Causes confusion Causes confusion  . Citalopram Nausea Only  . Trazodone And Nefazodone Other (See Comments)    Dry mouth  . Zoloft [Sertraline Hcl] Other (See Comments)    dizzy      Medication List       Accurate as of 06/19/16 11:06 AM. Always use your most recent med list.          amLODipine 2.5 MG tablet Commonly known as:  NORVASC Place 1 tablet (2.5 mg total) into feeding tube daily.   atorvastatin 40 MG tablet Commonly known as:  LIPITOR TAKE 1 TABLET BY  G tube DAILY AT 6PM FOR CHOLESTEROL   feeding supplement (PRO-STAT SUGAR FREE 64) Liqd Place 30 mLs into feeding tube daily.   ibuprofen 100 MG/5ML suspension Commonly known as:  ADVIL,MOTRIN Give 1mls via tube every 6 hours as needed for pain   polyethylene glycol packet Commonly known as:  MIRALAX / GLYCOLAX Place 17 g into feeding tube daily as needed.   Vitamin D 2000 units Caps  1 capsule (2,000 Units total) by Gastrostomy Tube route daily at 8 pm. Take one capsule by mouth once daily   zolpidem 5 MG tablet Commonly known as:  AMBIEN Place 5 mg into feeding tube at bedtime as needed for sleep.       Review of Systems:  Review of Systems  Constitutional: Positive for malaise/fatigue and weight loss. Negative for chills and fever.  HENT: Positive for congestion and hearing loss.        S/p laryngectomy, fistula repairs  Respiratory: Positive for cough. Negative for shortness of breath.   Cardiovascular: Negative for chest pain, palpitations and leg swelling.  Gastrointestinal: Negative for abdominal pain.  Genitourinary: Negative for dysuria.  Musculoskeletal: Positive for joint pain. Negative for falls.       Pain has returned behind his knee  Skin: Negative for itching and rash.  Neurological: Positive for speech change and weakness. Negative for dizziness.  Endo/Heme/Allergies: Bruises/bleeds easily.  Psychiatric/Behavioral: Positive for depression and memory loss.    Health Maintenance  Topic Date Due  . ZOSTAVAX  02/09/1987  . INFLUENZA VACCINE  03/10/2016  . TETANUS/TDAP  08/11/2023  . PNA vac Low Risk Adult  Completed    Physical Exam: Vitals:   06/19/16 1057  BP: 130/66  Pulse: 83  Temp: 98.2 F (36.8 C)  TempSrc: Oral  SpO2: 97%  Weight: 177 lb 6.4 oz (80.5 kg)  Height: 6\' 3"  (1.905 m)   Body mass index is 22.17 kg/m. Physical Exam  Constitutional: He is oriented to person, place, and time. No distress.  Neck:  Tracheostomy in place;  dressing covering area; using communication board  Cardiovascular: Normal rate and regular rhythm.   Pulmonary/Chest: Effort normal and breath sounds normal.  Abdominal: Soft. Bowel sounds are normal.  PEG in place   Musculoskeletal: Normal range of motion.  Uses cane for balance  Neurological: He is alert and oriented to person, place, and time.  Skin: Skin is warm and dry.  Psychiatric:  Seems down    Labs reviewed: Basic Metabolic Panel:  Recent Labs  03/26/16 0917 04/09/16 0643 05/04/16 0619  NA 136 141 139  K 3.9 4.0 3.9  CL 108 112* 110  CO2 20* 20* 22  GLUCOSE 105* 98 106*  BUN 28* 32* 28*  CREATININE 1.05 1.00 0.82  CALCIUM 8.5* 8.9 8.9   Liver Function Tests:  Recent Labs  08/26/15 1042 11/04/15 1537 02/21/16 1823  AST 19 16 19   ALT 14 12 14*  ALKPHOS 97 104 123  BILITOT 0.8 0.6 0.6  PROT 6.7 6.7 6.8  ALBUMIN 4.2 4.3 3.4*   No results for input(s): LIPASE, AMYLASE in the last 8760 hours. No results for input(s): AMMONIA in the last 8760 hours. CBC:  Recent Labs  08/26/15 1042 11/04/15 1537  01/17/16 0217  03/26/16 0917 04/09/16 0643 05/04/16 0619  WBC 7.3 9.4  < > 10.4  < > 8.0 8.4 7.6  NEUTROABS 4.6 6.5  --  8.5*  --   --   --   --   HGB  --   --   < > 12.0*  < > 12.3* 13.0 13.2  HCT 42.8 40.7  < > 37.6*  < > 38.6* 41.7 41.1  MCV 94 93  < > 95.9  < > 96.7 97.4 94.9  PLT 186 202  < > 165  < > 188 207 181  < > = values in this interval not displayed. Lipid Panel:  Recent Labs  08/26/15 1042  CHOL 144  HDL 49  LDLCALC 68  TRIG 135  CHOLHDL 2.9   Lab Results  Component Value Date   HGBA1C  11-09-202010    5.3 (NOTE) The ADA recommends the following therapeutic goal for glycemic control related to Hgb A1c measurement: Goal of therapy: <6.5 Hgb A1c  Reference: American Diabetes Association: Clinical Practice Recommendations 2010, Diabetes Care, 2010, 33: (Suppl  1).   Reviewed extensive surgeries and radiation visits    Assessment/Plan 1. Baker's cyst of knee, left -pt wants to wait until he's "back moreso to normal" for this to be aspirated and injected again  2. Essential hypertension -bp controlled, cont same regimen - amLODipine (NORVASC) 2.5 MG tablet; Place 1 tablet (2.5 mg total) into feeding tube daily.  Dispense: 90 tablet; Refill: 1  3. Hyperlipidemia, unspecified hyperlipidemia type -cont current medications - atorvastatin (LIPITOR) 40 MG tablet; TAKE 1 TABLET BY G tube DAILY AT 6PM FOR CHOLESTEROL  Dispense: 90 tablet; Refill: 1  4. Stage T1a Squamous Cell Carcinoma of the Right True Vocal Cord -s/p xRT, then laryngectomy, no further recurrence  5. Protein-calorie malnutrition, severe -has lost 30 lbs, is on tube feedings with prostat  6. Pharyngocutaneous fistula -s/p 2 additional surgeries to treat this, is now healing better he reports  7. Insomnia, unspecified type -has been on [place] for many years and has not been able to go without it despite my advice when he started to come here a few years ago  8. Need for prophylactic vaccination and inoculation against influenza - Flu Vaccine QUAD 36+ mos PF IM (Fluarix & Fluzone Quad PF) given  Labs/tests ordered:   Orders Placed This Encounter  Procedures  . Flu Vaccine QUAD 36+ mos PF IM (Fluarix & Fluzone Quad PF)    Next appt:  09/21/2016 med mgt  Timothy Suarez, D.O. [facility] [address],  [address] Cell Phone (Mon-Fri 8am-5pm):  [phone] On Call:  [phone] & follow prompts after 5pm & weekends Office Phone:  [phone] Office Fax:  [phone]

## 2016-07-23 ENCOUNTER — Ambulatory Visit: Payer: Self-pay | Admitting: Otolaryngology

## 2016-07-23 NOTE — H&P (Signed)
Timothy Suarez is an 82 y.o. male.   Chief Complaint: Phayngocutaneous fistula HPI: Post XRT laryngectomy, persisten leak from skin.  Past Medical History:  Diagnosis Date  . A-fib ([place])   . Aortic valve insufficiency, acquired   . Arthritis   . BPH (benign prostatic hyperplasia)   . Cerebral ischemia   . Colon cancer [facility])     s/p partial colectomy  . Complication of anesthesia    "woke up w/confusion and hallucinations once after mitral valve OR"  . Coronary artery disease    50-70% LAD mitral valve repair 4+ yrs ago  . Depression   . Dyslipidemia   . Glaucoma   . Heart murmur   . Hernia   . High cholesterol   . History of echocardiogram    Echo 5/16:  Mild LVH, EF 50-55%, Gr 1 DD, septal HK, Ao sclerosis without stenosis, mild AI, MV repair ok with borderline mild MS (mean 4 mmHg), mild MR, mild LAE, mod RAE, mild TR, PASP 30 mmHg  . HTN (hypertension)    takes Amlodipine daily  . Hx of cardiovascular stress test    Lexiscan Myoview 5/16:  Apical thinning, EF not gated, no ischemia. Low Risk  . Hyperlipidemia   . Major depression   . Mild cognitive impairment   . MVP (mitral valve prolapse)    S/P Rt mini thoractomy for Mitral Valve repair  . Nocturia   . Paroxysmal atrial fibrillation (HCC)    S/P Maze procedure  . 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 Prostate cancer ([place])   . 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 Shortness of breath dyspnea    with exertion  . Sleep disturbance   . Thoracic aortic aneurysm ([place])   . Urinary frequency   . Urinary urgency     Past Surgical History:  Procedure Laterality Date  . CARDIAC CATHETERIZATION    . CATARACT EXTRACTION W/ INTRAOCULAR LENS  IMPLANT, BILATERAL Bilateral   . COLECTOMY  2004   Dr [name]  . DIRECT LARYNGOSCOPY N/A 01/15/2016   Procedure: DIRECT LARYNGOSCOPY WITH BIOPSY AND FROZEN SECTION;  Surgeon: Izora Gala, MD;  Location:  [place];  Service: ENT;  Laterality: N/A;  . FlexHD patch repair of chest wall hernia.  01/28/2011   [name]  . GASTROSTOMY W/ FEEDING TUBE    . JOINT REPLACEMENT    . LARYNGETOMY N/A 01/15/2016   Procedure:  TOTAL LARYNGECTOMY;  Surgeon: Izora Gala, MD;  Location: [facility] OR;  Service: ENT;  Laterality: N/A;  . MAZE  12/27/2008   left side lesion set  . MICROLARYNGOSCOPY Right 01/29/2015   Procedure: MICROLARYNGOSCOPY WITH BIOSPY OF RIGHT VOCAL CORD;  Surgeon: Izora Gala, MD;  Location: [place];  Service: ENT;  Laterality: Right;  . MITRAL VALVE REPAIR  12/27/2008   complex valvuloplasty with 21mm Memo 3D annuloplasty ring via right minithoracotomy  . PECTORALIS FLAP Left 05/04/2016   Procedure: PECTORALIS FLAP to neck with possible, split thickness skin graft;  Surgeon: Loel Lofty Dillingham, DO;  Location: [place];  Service: Plastics;  Laterality: Left;  . PROSTATE BIOPSY    . SKIN SPLIT GRAFT Left 05/04/2016   Procedure: PECTORALIS MAJOR MYOCUTANEOUS FLAP RECONSTRUCTION OF PHARYNX AND SPLIT THICKNESS SKIN GRAFT;  Surgeon: Izora Gala, MD;  Location: [place];  Service: ENT;  Laterality: Left;  . TEE WITHOUT CARDIOVERSION N/A 01/03/2013   Procedure: TRANSESOPHAGEAL ECHOCARDIOGRAM (TEE);  Surgeon:  Sueanne Margarita, MD;  Location: [place];  Service: Cardiovascular;  Laterality: N/A;  . TOTAL KNEE ARTHROPLASTY Right 2003  . TRACHEAL ESOPHAGEAL PUNCTURE REPAIR N/A 03/26/2016   Procedure: TRACHEAL ESOPHAGEAL PUNCTURE;  Surgeon: Izora Gala, MD;  Location: [place];  Service: ENT;  Laterality: N/A;  . TRACHEOESOPHAGEAL FISTULA REPAIR N/A 03/26/2016   Procedure: TRACHEO-ESOPHAGEAL   PUNCTURE,FISTULAR CLOSURE;  Surgeon: Izora Gala, MD;  Location: [place];  Service: ENT;  Laterality: N/A;  . TRACHEOESOPHAGEAL FISTULA REPAIR N/A 04/09/2016   Procedure: FISTULA REPAIR WITH  MUSCLE ROTATION FLAP;  Surgeon: Izora Gala, MD;  Location: [place];  Service: ENT;  Laterality: N/A;  . TYMPANOPLASTY  1967   "? side"    Family  History  Problem Relation Age of Onset  . Cancer Mother     lymphoma  . Cancer Father     pancreatic  . Heart attack Neg Hx   . Stroke Neg Hx    Social History:  reports that he has never smoked. He has never used smokeless tobacco. He reports that he drinks about 8.4 oz of alcohol per week . He reports that he does not use drugs.  Allergies:  Allergies  Allergen Reactions  . Citalopram Nausea Only  . Trazodone And Nefazodone Other (See Comments)    Dry mouth  . Zoloft [Sertraline Hcl] Other (See Comments)    dizzy     (Not in a hospital admission)  No results found for this or any previous visit (from the past 48 hour(s)). No results found.  ROS: otherwise negative  There were no vitals taken for this visit.  PHYSICAL EXAM: Overall appearance:  Healthy appearing, in no distress Head:  Normocephalic, atraumatic. Ears: External auditory canals are clear; tympanic membranes are intact and the middle ears are free of any effusion. Nose: External nose is healthy in appearance. Internal nasal exam free of any lesions or obstruction. Oral Cavity/pharynx:  There are no mucosal lesions or masses identified. Neuro:  No identifiable neurologic deficits. Neck: No palpable neck masses. Stoma healthy. Pinhole fistula in upper neck.  Studies Reviewed: none    Assessment/Plan Complete repair of P-C fistula.  Timothy Suarez 07/23/2016, 9:03 AM

## 2016-07-24 ENCOUNTER — Encounter (HOSPITAL_COMMUNITY): Payer: Self-pay | Admitting: *Deleted

## 2016-07-24 NOTE — Progress Notes (Signed)
Pt SDW-Pre-op call completed by pt daughter Neoma Laming. Daughter denies that pt c/o SOB and chest pain. Daughter stated that pt is not under the care of a cardiologist. Daughter made aware to have pt stop taking  Aspirin ( pt doesn't take) vitamins, fish oil and herbal medications. Do not take any NSAIDs ie: Ibuprofen, Advil, Naproxen, BC and Goody Powder or any medication containing Aspirin. Daughter verbalized understanding of all pre-op instructions.

## 2016-07-27 ENCOUNTER — Ambulatory Visit (HOSPITAL_COMMUNITY): Payer: Medicare Other | Admitting: Certified Registered Nurse Anesthetist

## 2016-07-27 ENCOUNTER — Encounter (HOSPITAL_COMMUNITY)
Admission: RE | Disposition: A | Discharge status: Home / Self Care | Payer: Self-pay | Source: Ambulatory Visit | Attending: Otolaryngology

## 2016-07-27 ENCOUNTER — Encounter (HOSPITAL_COMMUNITY): Payer: Self-pay

## 2016-07-27 ENCOUNTER — Ambulatory Visit (HOSPITAL_COMMUNITY)
Admission: RE | Admit: 2016-07-27 | Discharge: 2016-07-27 | Disposition: A | Discharge status: Home / Self Care | Payer: Medicare Other | Source: Ambulatory Visit | Attending: Otolaryngology | Admitting: Otolaryngology

## 2016-07-27 DIAGNOSIS — Z9002 Acquired absence of larynx: Secondary | ICD-10-CM | POA: Diagnosis not present

## 2016-07-27 DIAGNOSIS — Z8546 Personal history of malignant neoplasm of prostate: Secondary | ICD-10-CM | POA: Insufficient documentation

## 2016-07-27 DIAGNOSIS — L988 Other specified disorders of the skin and subcutaneous tissue: Secondary | ICD-10-CM | POA: Insufficient documentation

## 2016-07-27 DIAGNOSIS — E7800 Pure hypercholesterolemia, unspecified: Secondary | ICD-10-CM | POA: Diagnosis not present

## 2016-07-27 DIAGNOSIS — I2510 Atherosclerotic heart disease of native coronary artery without angina pectoris: Secondary | ICD-10-CM | POA: Insufficient documentation

## 2016-07-27 DIAGNOSIS — I480 Paroxysmal atrial fibrillation: Secondary | ICD-10-CM | POA: Diagnosis not present

## 2016-07-27 DIAGNOSIS — I10 Essential (primary) hypertension: Secondary | ICD-10-CM | POA: Insufficient documentation

## 2016-07-27 DIAGNOSIS — J392 Other diseases of pharynx: Secondary | ICD-10-CM | POA: Diagnosis not present

## 2016-07-27 DIAGNOSIS — F329 Major depressive disorder, single episode, unspecified: Secondary | ICD-10-CM | POA: Insufficient documentation

## 2016-07-27 DIAGNOSIS — Z923 Personal history of irradiation: Secondary | ICD-10-CM | POA: Diagnosis not present

## 2016-07-27 DIAGNOSIS — Z85038 Personal history of other malignant neoplasm of large intestine: Secondary | ICD-10-CM | POA: Insufficient documentation

## 2016-07-27 DIAGNOSIS — Z8521 Personal history of malignant neoplasm of larynx: Secondary | ICD-10-CM | POA: Insufficient documentation

## 2016-07-27 HISTORY — PX: TRACHEOESOPHAGEAL FISTULA REPAIR: SHX2557

## 2016-07-27 LAB — CBC
HEMATOCRIT: 38.6 % — AB (ref 39.0–52.0)
HEMOGLOBIN: 12.7 g/dL — AB (ref 13.0–17.0)
MCH: 31.4 pg (ref 26.0–34.0)
MCHC: 32.9 g/dL (ref 30.0–36.0)
MCV: 95.3 fL (ref 78.0–100.0)
Platelets: 196 10*3/uL (ref 150–400)
RBC: 4.05 MIL/uL — ABNORMAL LOW (ref 4.22–5.81)
RDW: 14.4 % (ref 11.5–15.5)
WBC: 7.4 10*3/uL (ref 4.0–10.5)

## 2016-07-27 LAB — BASIC METABOLIC PANEL
ANION GAP: 5 (ref 5–15)
BUN: 23 mg/dL — ABNORMAL HIGH (ref 6–20)
CALCIUM: 8.6 mg/dL — AB (ref 8.9–10.3)
CHLORIDE: 110 mmol/L (ref 101–111)
CO2: 25 mmol/L (ref 22–32)
Creatinine, Ser: 1.03 mg/dL (ref 0.61–1.24)
GFR calc non Af Amer: >60 mL/min (ref >60)
Glucose, Bld: 93 mg/dL (ref 65–99)
POTASSIUM: 3.9 mmol/L (ref 3.5–5.1)
Sodium: 140 mmol/L (ref 135–145)

## 2016-07-27 SURGERY — TRACHEAL ESOPHAGEAL FISTULA REPAIR
Anesthesia: General | Site: Neck

## 2016-07-27 MED ORDER — PROPOFOL 10 MG/ML IV BOLUS
INTRAVENOUS | Status: DC | PRN
  Administered 2016-07-27: 150 mg via INTRAVENOUS
  Administered 2016-07-27: 50 mg via INTRAVENOUS

## 2016-07-27 MED ORDER — OXYCODONE HCL 5 MG/5ML PO SOLN: 5.0000 mg | Freq: Once | ORAL | Status: DC | PRN

## 2016-07-27 MED ORDER — ROCURONIUM BROMIDE 10 MG/ML (PF) SYRINGE
PREFILLED_SYRINGE | INTRAVENOUS | Status: DC | PRN
  Administered 2016-07-27: 50 mg via INTRAVENOUS

## 2016-07-27 MED ORDER — SUGAMMADEX SODIUM 200 MG/2ML IV SOLN
INTRAVENOUS | Status: DC | PRN
  Administered 2016-07-27: 100 mg via INTRAVENOUS

## 2016-07-27 MED ORDER — FENTANYL CITRATE (PF) 100 MCG/2ML IJ SOLN: 25.0000 ug | INTRAMUSCULAR | Status: DC | PRN

## 2016-07-27 MED ORDER — LACTATED RINGERS IV SOLN
INTRAVENOUS | Status: DC
  Administered 2016-07-27: 09:00:00 via INTRAVENOUS

## 2016-07-27 MED ORDER — OXYCODONE HCL 5 MG PO TABS: 5.0000 mg | ORAL_TABLET | Freq: Once | ORAL | Status: DC | PRN

## 2016-07-27 MED ORDER — FENTANYL CITRATE (PF) 100 MCG/2ML IJ SOLN
INTRAMUSCULAR | Status: DC | PRN
Start: 2016-07-27 — End: 2016-07-27
  Administered 2016-07-27: 100 ug via INTRAVENOUS

## 2016-07-27 MED ORDER — PHENYLEPHRINE HCL 10 MG/ML IJ SOLN
INTRAVENOUS | Status: DC | PRN
  Administered 2016-07-27: 30 ug/min via INTRAVENOUS

## 2016-07-27 MED ORDER — LIDOCAINE-EPINEPHRINE 1 %-1:100000 IJ SOLN: INTRAMUSCULAR | Status: DC | PRN

## 2016-07-27 MED ORDER — PROPOFOL 10 MG/ML IV BOLUS
INTRAVENOUS | Status: AC
  Filled 2016-07-27: qty 20

## 2016-07-27 MED ORDER — FENTANYL CITRATE (PF) 100 MCG/2ML IJ SOLN
INTRAMUSCULAR | Status: AC
  Filled 2016-07-27: qty 2

## 2016-07-27 MED ORDER — PHENYLEPHRINE 40 MCG/ML (10ML) SYRINGE FOR IV PUSH (FOR BLOOD PRESSURE SUPPORT)
PREFILLED_SYRINGE | INTRAVENOUS | Status: DC | PRN
  Administered 2016-07-27: 80 ug via INTRAVENOUS
  Administered 2016-07-27 (×2): 40 ug via INTRAVENOUS

## 2016-07-27 MED ORDER — LIDOCAINE 2% (20 MG/ML) 5 ML SYRINGE
INTRAMUSCULAR | Status: DC | PRN
  Administered 2016-07-27: 50 mg via INTRAVENOUS

## 2016-07-27 MED ORDER — EPHEDRINE SULFATE-NACL 50-0.9 MG/10ML-% IV SOSY
PREFILLED_SYRINGE | INTRAVENOUS | Status: DC | PRN
  Administered 2016-07-27: 5 mg via INTRAVENOUS

## 2016-07-27 MED ORDER — 0.9 % SODIUM CHLORIDE (POUR BTL) OPTIME
TOPICAL | Status: DC | PRN
Start: 2016-07-27 — End: 2016-07-27
  Administered 2016-07-27: 1000 mL

## 2016-07-27 SURGICAL SUPPLY — 36 items
BLADE SURG 15 STRL LF DISP TIS (BLADE) IMPLANT
BLADE SURG 15 STRL SS (BLADE)
BLADE SURG ROTATE 9660 (MISCELLANEOUS) IMPLANT
CANISTER SUCTION 2500CC (MISCELLANEOUS) ×3 IMPLANT
CLEANER TIP ELECTROSURG 2X2 (MISCELLANEOUS) ×3 IMPLANT
COVER SURGICAL LIGHT HANDLE (MISCELLANEOUS) ×3 IMPLANT
DECANTER SPIKE VIAL GLASS SM (MISCELLANEOUS) ×3 IMPLANT
DERMABOND ADVANCED (GAUZE/BANDAGES/DRESSINGS) ×2
DERMABOND ADVANCED .7 DNX12 (GAUZE/BANDAGES/DRESSINGS) ×1 IMPLANT
DRAPE PROXIMA HALF (DRAPES) IMPLANT
ELECT COATED BLADE 2.86 ST (ELECTRODE) ×3 IMPLANT
ELECT REM PT RETURN 9FT ADLT (ELECTROSURGICAL) ×3
ELECTRODE REM PT RTRN 9FT ADLT (ELECTROSURGICAL) ×1 IMPLANT
GAUZE SPONGE 4X4 16PLY XRAY LF (GAUZE/BANDAGES/DRESSINGS) ×3 IMPLANT
GLOVE ECLIPSE 7.5 STRL STRAW (GLOVE) ×3 IMPLANT
GOWN STRL REUS W/ TWL LRG LVL3 (GOWN DISPOSABLE) ×2 IMPLANT
GOWN STRL REUS W/TWL LRG LVL3 (GOWN DISPOSABLE) ×4
KIT BASIN OR (CUSTOM PROCEDURE TRAY) ×3 IMPLANT
KIT ROOM TURNOVER OR (KITS) ×3 IMPLANT
NEEDLE PRECISIONGLIDE 27X1.5 (NEEDLE) IMPLANT
NS IRRIG 1000ML POUR BTL (IV SOLUTION) ×3 IMPLANT
PACK EENT II TURBAN DRAPE (CUSTOM PROCEDURE TRAY) ×3 IMPLANT
PAD ARMBOARD 7.5X6 YLW CONV (MISCELLANEOUS) ×6 IMPLANT
PENCIL FOOT CONTROL (ELECTRODE) ×3 IMPLANT
SUT CHROMIC 2 0 SH (SUTURE) IMPLANT
SUT ETHILON 3 0 PS 1 (SUTURE) IMPLANT
SUT PLAIN 4 0 ~~LOC~~ 1 (SUTURE) ×3 IMPLANT
SUT PLAIN 5 0 P 3 18 (SUTURE) ×3 IMPLANT
SUT SILK 4 0 TIES 17X18 (SUTURE) ×3 IMPLANT
SUT VIC AB 3-0 PS2 18 (SUTURE) ×3 IMPLANT
SYR 20CC LL (SYRINGE) ×3 IMPLANT
SYR CONTROL 10ML LL (SYRINGE) IMPLANT
TOWEL OR 17X24 6PK STRL BLUE (TOWEL DISPOSABLE) ×3 IMPLANT
TUBE CONNECTING 12'X1/4 (SUCTIONS) ×1
TUBE CONNECTING 12X1/4 (SUCTIONS) ×2 IMPLANT
WATER STERILE IRR 1000ML POUR (IV SOLUTION) ×3 IMPLANT

## 2016-07-27 NOTE — Discharge Instructions (Signed)
Please maintain a liquid diet until your follow-up visit next week. Call if there is any leaking.

## 2016-07-27 NOTE — Interval H&P Note (Signed)
History and Physical Interval Note:  07/27/2016 9:03 AM  Timothy Suarez  has presented today for surgery, with the diagnosis of PHARYNGOCUTANEOUS FISTULAR   The various methods of treatment have been discussed with the patient and family. After consideration of risks, benefits and other options for treatment, the patient has consented to  Procedure(s): CLOSURE OF FISTULAR (N/A) as a surgical intervention .  The patient's history has been reviewed, patient examined, no change in status, stable for surgery.  I have reviewed the patient's chart and labs.  Questions were answered to the patient's satisfaction.     Maya Arcand

## 2016-07-27 NOTE — H&P (View-Only) (Signed)
Timothy Suarez is an 82 y.o. male.   Chief Complaint: Phayngocutaneous fistula HPI: Post XRT laryngectomy, persisten leak from skin.  Past Medical History:  Diagnosis Date  . A-fib ([place])   . Aortic valve insufficiency, acquired   . Arthritis   . BPH (benign prostatic hyperplasia)   . Cerebral ischemia   . Colon cancer [facility])     s/p partial colectomy  . Complication of anesthesia    "woke up w/confusion and hallucinations once after mitral valve OR"  . Coronary artery disease    50-70% LAD mitral valve repair 4+ yrs ago  . Depression   . Dyslipidemia   . Glaucoma   . Heart murmur   . Hernia   . High cholesterol   . History of echocardiogram    Echo 5/16:  Mild LVH, EF 50-55%, Gr 1 DD, septal HK, Ao sclerosis without stenosis, mild AI, MV repair ok with borderline mild MS (mean 4 mmHg), mild MR, mild LAE, mod RAE, mild TR, PASP 30 mmHg  . HTN (hypertension)    takes Amlodipine daily  . Hx of cardiovascular stress test    Lexiscan Myoview 5/16:  Apical thinning, EF not gated, no ischemia. Low Risk  . Hyperlipidemia   . Major depression   . Mild cognitive impairment   . MVP (mitral valve prolapse)    S/P Rt mini thoractomy for Mitral Valve repair  . Nocturia   . Paroxysmal atrial fibrillation (HCC)    S/P Maze procedure  . 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 Prostate cancer ([place])   . 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 Shortness of breath dyspnea    with exertion  . Sleep disturbance   . Thoracic aortic aneurysm ([place])   . Urinary frequency   . Urinary urgency     Past Surgical History:  Procedure Laterality Date  . CARDIAC CATHETERIZATION    . CATARACT EXTRACTION W/ INTRAOCULAR LENS  IMPLANT, BILATERAL Bilateral   . COLECTOMY  2004   Dr [name]  . DIRECT LARYNGOSCOPY N/A 01/15/2016   Procedure: DIRECT LARYNGOSCOPY WITH BIOPSY AND FROZEN SECTION;  Surgeon: Izora Gala, MD;  Location:  [place];  Service: ENT;  Laterality: N/A;  . FlexHD patch repair of chest wall hernia.  01/28/2011   [name]  . GASTROSTOMY W/ FEEDING TUBE    . JOINT REPLACEMENT    . LARYNGETOMY N/A 01/15/2016   Procedure:  TOTAL LARYNGECTOMY;  Surgeon: Izora Gala, MD;  Location: [facility] OR;  Service: ENT;  Laterality: N/A;  . MAZE  12/27/2008   left side lesion set  . MICROLARYNGOSCOPY Right 01/29/2015   Procedure: MICROLARYNGOSCOPY WITH BIOSPY OF RIGHT VOCAL CORD;  Surgeon: Izora Gala, MD;  Location: [place];  Service: ENT;  Laterality: Right;  . MITRAL VALVE REPAIR  12/27/2008   complex valvuloplasty with 29mm Memo 3D annuloplasty ring via right minithoracotomy  . PECTORALIS FLAP Left 05/04/2016   Procedure: PECTORALIS FLAP to neck with possible, split thickness skin graft;  Surgeon: Loel Lofty Dillingham, DO;  Location: [place];  Service: Plastics;  Laterality: Left;  . PROSTATE BIOPSY    . SKIN SPLIT GRAFT Left 05/04/2016   Procedure: PECTORALIS MAJOR MYOCUTANEOUS FLAP RECONSTRUCTION OF PHARYNX AND SPLIT THICKNESS SKIN GRAFT;  Surgeon: Izora Gala, MD;  Location: [place];  Service: ENT;  Laterality: Left;  . TEE WITHOUT CARDIOVERSION N/A 01/03/2013   Procedure: TRANSESOPHAGEAL ECHOCARDIOGRAM (TEE);  Surgeon:  Sueanne Margarita, MD;  Location: [place];  Service: Cardiovascular;  Laterality: N/A;  . TOTAL KNEE ARTHROPLASTY Right 2003  . TRACHEAL ESOPHAGEAL PUNCTURE REPAIR N/A 03/26/2016   Procedure: TRACHEAL ESOPHAGEAL PUNCTURE;  Surgeon: Izora Gala, MD;  Location: [place];  Service: ENT;  Laterality: N/A;  . TRACHEOESOPHAGEAL FISTULA REPAIR N/A 03/26/2016   Procedure: TRACHEO-ESOPHAGEAL   PUNCTURE,FISTULAR CLOSURE;  Surgeon: Izora Gala, MD;  Location: [place];  Service: ENT;  Laterality: N/A;  . TRACHEOESOPHAGEAL FISTULA REPAIR N/A 04/09/2016   Procedure: FISTULA REPAIR WITH  MUSCLE ROTATION FLAP;  Surgeon: Izora Gala, MD;  Location: [place];  Service: ENT;  Laterality: N/A;  . TYMPANOPLASTY  1967   "? side"    Family  History  Problem Relation Age of Onset  . Cancer Mother     lymphoma  . Cancer Father     pancreatic  . Heart attack Neg Hx   . Stroke Neg Hx    Social History:  reports that he has never smoked. He has never used smokeless tobacco. He reports that he drinks about 8.4 oz of alcohol per week . He reports that he does not use drugs.  Allergies:  Allergies  Allergen Reactions  . Citalopram Nausea Only  . Trazodone And Nefazodone Other (See Comments)    Dry mouth  . Zoloft [Sertraline Hcl] Other (See Comments)    dizzy     (Not in a hospital admission)  No results found for this or any previous visit (from the past 48 hour(s)). No results found.  ROS: otherwise negative  There were no vitals taken for this visit.  PHYSICAL EXAM: Overall appearance:  Healthy appearing, in no distress Head:  Normocephalic, atraumatic. Ears: External auditory canals are clear; tympanic membranes are intact and the middle ears are free of any effusion. Nose: External nose is healthy in appearance. Internal nasal exam free of any lesions or obstruction. Oral Cavity/pharynx:  There are no mucosal lesions or masses identified. Neuro:  No identifiable neurologic deficits. Neck: No palpable neck masses. Stoma healthy. Pinhole fistula in upper neck.  Studies Reviewed: none    Assessment/Plan Complete repair of P-C fistula.  Timothy Suarez 07/23/2016, 9:03 AM

## 2016-07-27 NOTE — Transfer of Care (Signed)
Immediate Anesthesia Transfer of Care Note  Patient: Timothy Suarez  Procedure(s) Performed: Procedure(s): CLOSURE OF FISTULA (N/A)  Patient Location: PACU  Anesthesia Type:General  Level of Consciousness: awake, alert  and oriented  Airway & Oxygen Therapy: Patient Spontanous Breathing, O2 over stoma   Post-op Assessment: Report given to RN and Post -op Vital signs reviewed and stable  Post vital signs: Reviewed and stable  Last Vitals:  Vitals:   07/27/16 0816  BP: 138/62  Pulse: 71  Resp: 20  Temp: 36.7 C    Last Pain: There were no vitals filed for this visit.       Complications: No apparent anesthesia complications

## 2016-07-27 NOTE — Anesthesia Preprocedure Evaluation (Signed)
Anesthesia Evaluation  Patient identified by MRN, date of birth, ID band Patient awake    Reviewed: Allergy & Precautions, H&P , NPO status , Patient's Chart, lab work & pertinent test results  Airway Mallampati: II   Neck ROM: full    Dental   Pulmonary shortness of breath,    breath sounds clear to auscultation       Cardiovascular hypertension, + Peripheral Vascular Disease  + Valvular Problems/Murmurs  Rhythm:regular Rate:Normal  S/p MVr 2010.   Neuro/Psych PSYCHIATRIC DISORDERS Depression    GI/Hepatic   Endo/Other    Renal/GU      Musculoskeletal  (+) Arthritis ,   Abdominal   Peds  Hematology   Anesthesia Other Findings   Reproductive/Obstetrics                             Anesthesia Physical Anesthesia Plan  ASA: III  Anesthesia Plan: General   Post-op Pain Management:    Induction: Intravenous  Airway Management Planned: Oral ETT  Additional Equipment:   Intra-op Plan:   Post-operative Plan: Extubation in OR  Informed Consent: I have reviewed the patients History and Physical, chart, labs and discussed the procedure including the risks, benefits and alternatives for the proposed anesthesia with the patient or authorized representative who has indicated his/her understanding and acceptance.     Plan Discussed with: CRNA, Anesthesiologist and Surgeon  Anesthesia Plan Comments:         Anesthesia Quick Evaluation

## 2016-07-27 NOTE — Anesthesia Postprocedure Evaluation (Signed)
Anesthesia Post Note  Patient: Timothy Suarez  Procedure(s) Performed: Procedure(s) (LRB): CLOSURE OF FISTULA (N/A)  Patient location during evaluation: PACU Anesthesia Type: General Level of consciousness: awake and alert and patient cooperative Pain management: pain level controlled Vital Signs Assessment: post-procedure vital signs reviewed and stable Respiratory status: spontaneous breathing and respiratory function stable Cardiovascular status: stable Anesthetic complications: no       Last Vitals:  Vitals:   07/27/16 1145 07/27/16 1203  BP: 134/66 134/66  Pulse: 61 66  Resp: (!) 22 16  Temp: 36.3 C     Last Pain: There were no vitals filed for this visit.               [place]

## 2016-07-27 NOTE — Op Note (Signed)
OPERATIVE REPORT  DATE OF SURGERY: 07/27/2016  PATIENT:  Timothy Suarez,  82 y.o. male  PRE-OPERATIVE DIAGNOSIS:  PHARYNGOCUTANEOUS FISTULA   POST-OPERATIVE DIAGNOSIS:  PHARYNGOCUTANEOUS FISTULA  PROCEDURE:  Procedure(s): CLOSURE OF FISTULA  SURGEON:  Beckie Salts, MD  ASSISTANTS: None  ANESTHESIA:   General   EBL:  20 ml  DRAINS: None  LOCAL MEDICATIONS USED:  None  SPECIMEN:  none  COUNTS:  Correct  PROCEDURE DETAILS: The patient was taken to the operating room and placed on the operating table in the supine position. Following induction of general endotracheal anesthesia, the neck was prepped and draped in a standard fashion. The fistula site was identified. The external portion was about 2 mm. An ellipse of skin was outlined with a marking pen in an oblique fashion. A Z-plasty was planned on both sides and proposed incisions were outlined with a marking pen. A #15 scalpel was used to incise the skin and remove the fistula tract. The pharyngeal defect was then identified, was proximally 6 or 7 mm in greatest dimension. The edges were reapproximated using interrupted 3-0 Vicryl suture. Closure appeared to be watertight. The Z-plasty was then performed using the 15 scalpel and adequate undermining was performed using electrocautery. The Z limbs were then brought in on each other and the entire external defect was closed in 2 layers using 3-0 Vicryl in the deeper layer and interrupted 5-0 plain gut on the skin. Closure appeared to be intact and all skin appeared to be viable. There is no tension on any of the closures. Dermabond was then applied. The patient was awakened extubated and transferred to recovery in stable condition.    PATIENT DISPOSITION:  To PACU, stable

## 2016-07-27 NOTE — Anesthesia Procedure Notes (Signed)
Procedure Name: Intubation Date/Time: 07/27/2016 10:38 AM Performed by: Merdis Delay Pre-anesthesia Checklist: Patient identified, Emergency Drugs available, Suction available, Patient being monitored and Timeout performed Patient Re-evaluated:Patient Re-evaluated prior to inductionOxygen Delivery Method: Circle system utilized Preoxygenation: Pre-oxygenation with 100% oxygen Intubation Type: IV induction Ventilation: Unable to mask ventilate Tube type: Reinforced Tube size: 7.0 mm Number of attempts: 1 Airway Equipment and Method: Stylet Placement Confirmation: positive ETCO2,  CO2 detector and breath sounds checked- equal and bilateral Tube secured with: Tape Dental Injury: Teeth and Oropharynx as per pre-operative assessment

## 2016-07-28 ENCOUNTER — Encounter (HOSPITAL_COMMUNITY): Payer: Self-pay | Admitting: Otolaryngology

## 2016-07-30 ENCOUNTER — Ambulatory Visit (INDEPENDENT_AMBULATORY_CARE_PROVIDER_SITE_OTHER): Payer: Medicare Other | Admitting: Internal Medicine

## 2016-07-30 ENCOUNTER — Encounter: Payer: Self-pay | Admitting: Internal Medicine

## 2016-07-30 VITALS — BP 130/80 | HR 62 | Temp 98.7°F | Wt 182.0 lb

## 2016-07-30 DIAGNOSIS — J392 Other diseases of pharynx: Secondary | ICD-10-CM | POA: Diagnosis not present

## 2016-07-30 DIAGNOSIS — E43 Unspecified severe protein-calorie malnutrition: Secondary | ICD-10-CM

## 2016-07-30 DIAGNOSIS — C320 Malignant neoplasm of glottis: Secondary | ICD-10-CM | POA: Diagnosis not present

## 2016-07-30 DIAGNOSIS — M7122 Synovial cyst of popliteal space [Baker], left knee: Secondary | ICD-10-CM

## 2016-07-30 NOTE — Progress Notes (Signed)
Location:  [facility] clinic Provider: Kizzie Cotten L. Mariea Clonts, D.O., C.M.D.  Code Status: DNR Goals of Care:  Advanced Directives 07/27/2016  Does Patient Have a Medical Advance Directive? Yes  Type of Advance Directive -  Does patient want to make changes to medical advance directive? -  Copy of [place] in Chart? -  Pre-existing out of facility DNR order (yellow form or pink MOST form) -    Chief Complaint  Patient presents with  . Acute Visit    left knee pain    HPI: Patient is a 82 y.o. male seen today for an acute visit for left knee pain.    He, unfortunately, has had to have yet another surgery for his fistula in his neck.  He just left the hospital yesterday.  His med list had all kinds of yellow changes on it that were not fixed correctly and had to be revised.  The left knee is stiff--he's unable to bend it back all the way.  It does not hurt him.  He does want the cyst aspirated.  Dr. Annamaria Boots at [facility] did a good job last time, pt reports and wants to have him to do the procedure again.  He has gained 5 lbs.  He is now on a liquid diet and denies problems swallowing it.   Still getting pills in g-tube.  Follows up with Dr. Constance Holster again Tuesday.  Also still getting half his diet via tube feeding.  He is having regular BMs since taking oral fluids.  Says his mental state has improved now.  He no longer has to change dressings on his neck two to three times a day.  Is open to air at present.    Past Medical History:  Diagnosis Date  . A-fib ([place])   . Aortic valve insufficiency, acquired   . Arthritis   . BPH (benign prostatic hyperplasia)   . Cerebral ischemia   . Colon cancer [facility])     s/p partial colectomy  . Complication of anesthesia    "woke up w/confusion and hallucinations once after mitral valve OR"  . Coronary artery disease    50-70% LAD mitral valve repair 4+ yrs ago  . Depression   . Dyslipidemia   . Glaucoma   . Heart murmur   .  Hernia   . High cholesterol   . History of echocardiogram    Echo 5/16:  Mild LVH, EF 50-55%, Gr 1 DD, septal HK, Ao sclerosis without stenosis, mild AI, MV repair ok with borderline mild MS (mean 4 mmHg), mild MR, mild LAE, mod RAE, mild TR, PASP 30 mmHg  . HTN (hypertension)    takes Amlodipine daily  . Hx of cardiovascular stress test    Lexiscan Myoview 5/16:  Apical thinning, EF not gated, no ischemia. Low Risk  . Hyperlipidemia   . Major depression   . Mild cognitive impairment   . MVP (mitral valve prolapse)    S/P Rt mini thoractomy for Mitral Valve repair  . Nocturia   . Paroxysmal atrial fibrillation (HCC)    S/P Maze procedure  . 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 Prostate cancer ([place])   . 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 Shortness of breath dyspnea    with exertion  . Sleep disturbance   . Thoracic aortic aneurysm ([place])   . Urinary frequency   . Urinary urgency  Past Surgical History:  Procedure Laterality Date  . CARDIAC CATHETERIZATION    . CATARACT EXTRACTION W/ INTRAOCULAR LENS  IMPLANT, BILATERAL Bilateral   . COLECTOMY  2004   Dr [name]  . DIRECT LARYNGOSCOPY N/A 01/15/2016   Procedure: DIRECT LARYNGOSCOPY WITH BIOPSY AND FROZEN SECTION;  Surgeon: Izora Gala, MD;  Location: [facility];  Service: ENT;  Laterality: N/A;  . FlexHD patch repair of chest wall hernia.  01/28/2011   [name]  . GASTROSTOMY W/ FEEDING TUBE    . JOINT REPLACEMENT    . LARYNGETOMY N/A 01/15/2016   Procedure:  TOTAL LARYNGECTOMY;  Surgeon: Izora Gala, MD;  Location: [facility] OR;  Service: ENT;  Laterality: N/A;  . MAZE  12/27/2008   left side lesion set  . MICROLARYNGOSCOPY Right 01/29/2015   Procedure: MICROLARYNGOSCOPY WITH BIOSPY OF RIGHT VOCAL CORD;  Surgeon: Izora Gala, MD;  Location: [place];  Service: ENT;  Laterality: Right;  . MITRAL VALVE REPAIR  12/27/2008   complex valvuloplasty with 34mm Memo 3D  annuloplasty ring via right minithoracotomy  . PECTORALIS FLAP Left 05/04/2016   Procedure: PECTORALIS FLAP to neck with possible, split thickness skin graft;  Surgeon: Loel Lofty Dillingham, DO;  Location: [place];  Service: Plastics;  Laterality: Left;  . PROSTATE BIOPSY    . SKIN SPLIT GRAFT Left 05/04/2016   Procedure: PECTORALIS MAJOR MYOCUTANEOUS FLAP RECONSTRUCTION OF PHARYNX AND SPLIT THICKNESS SKIN GRAFT;  Surgeon: Izora Gala, MD;  Location: [place];  Service: ENT;  Laterality: Left;  . TEE WITHOUT CARDIOVERSION N/A 01/03/2013   Procedure: TRANSESOPHAGEAL ECHOCARDIOGRAM (TEE);  Surgeon: Sueanne Margarita, MD;  Location: [place];  Service: Cardiovascular;  Laterality: N/A;  . TOTAL KNEE ARTHROPLASTY Right 2003  . TRACHEAL ESOPHAGEAL PUNCTURE REPAIR N/A 03/26/2016   Procedure: TRACHEAL ESOPHAGEAL PUNCTURE;  Surgeon: Izora Gala, MD;  Location: [facility] OR;  Service: ENT;  Laterality: N/A;  . TRACHEOESOPHAGEAL FISTULA REPAIR N/A 03/26/2016   Procedure: TRACHEO-ESOPHAGEAL   PUNCTURE,FISTULAR CLOSURE;  Surgeon: Izora Gala, MD;  Location: [facility] OR;  Service: ENT;  Laterality: N/A;  . TRACHEOESOPHAGEAL FISTULA REPAIR N/A 04/09/2016   Procedure: FISTULA REPAIR WITH  MUSCLE ROTATION FLAP;  Surgeon: Izora Gala, MD;  Location: [place];  Service: ENT;  Laterality: N/A;  . TRACHEOESOPHAGEAL FISTULA REPAIR N/A 07/27/2016   Procedure: CLOSURE OF FISTULA;  Surgeon: Izora Gala, MD;  Location: [place];  Service: ENT;  Laterality: N/A;  . TYMPANOPLASTY  1967   "? side"    Allergies  Allergen Reactions  . Citalopram Nausea Only  . Trazodone And Nefazodone Other (See Comments)    Dry mouth  . Zoloft [Sertraline Hcl] Other (See Comments)    dizzy    Allergies as of 07/30/2016      Reactions   Citalopram Nausea Only   Trazodone And Nefazodone Other (See Comments)   Dry mouth   Zoloft [sertraline Hcl] Other (See Comments)   dizzy      Medication List       Accurate as of 07/30/16 10:31 AM. Always use your  most recent med list.          amLODipine 2.5 MG tablet Commonly known as:  NORVASC Place 1 tablet (2.5 mg total) into feeding tube daily.   ibuprofen 100 MG/5ML suspension Commonly known as:  ADVIL,MOTRIN Give 65mls via tube every 6 hours as needed for pain   Vitamin D 2000 units Caps 1 capsule (2,000 Units total) by Gastrostomy Tube route daily at 8 pm. Take one  capsule by mouth once daily   zolpidem 5 MG tablet Commonly known as:  AMBIEN Place 1 tablet (5 mg total) into feeding tube at bedtime as needed for sleep.       Review of Systems:  Review of Systems  Constitutional: Negative for chills, fever and malaise/fatigue.       Has gained 5 lbs since liquid diet now  HENT:       Trach in place, wound healing  Eyes: Negative for blurred vision.       Glasses  Respiratory: Negative for shortness of breath.   Cardiovascular: Negative for chest pain, palpitations and leg swelling.  Gastrointestinal: Negative for abdominal pain.  Genitourinary: Negative for dysuria.  Musculoskeletal: Positive for joint pain. Negative for falls.       Left leg stiffness; walks with cane  Skin:       Fistula surgery yesterday   Neurological: Negative for dizziness, loss of consciousness and weakness.  Psychiatric/Behavioral: Positive for memory loss. Negative for depression. The patient is not nervous/anxious and does not have insomnia.     Health Maintenance  Topic Date Due  . ZOSTAVAX  02/09/1987  . TETANUS/TDAP  08/11/2023  . INFLUENZA VACCINE  Completed  . PNA vac Low Risk Adult  Completed    Physical Exam: Vitals:   07/30/16 1026  BP: 130/80  Pulse: 62  Temp: 98.7 F (37.1 C)  TempSrc: Oral  SpO2: 97%  Weight: 182 lb (82.6 kg)   Body mass index is 22.75 kg/m. Physical Exam  Constitutional: He is oriented to person, place, and time. No distress.  Communicating with dry erase board  HENT:  Head: Normocephalic and atraumatic.  Neck:  Trach in place, wound healing  from surgery  Cardiovascular: Normal rate, regular rhythm, normal heart sounds and intact distal pulses.   Pulmonary/Chest: Effort normal and breath sounds normal. No respiratory distress.  Abdominal: Soft. Bowel sounds are normal. He exhibits no distension.  Musculoskeletal: Normal range of motion.  Walking with cane; large cyst palpable behind left knee  Neurological: He is alert and oriented to person, place, and time.  Skin: Skin is warm and dry. Capillary refill takes less than 2 seconds.  Psychiatric: He has a normal mood and affect.    Labs reviewed: Basic Metabolic Panel:  Recent Labs  04/09/16 0643 05/04/16 0619 07/27/16 1000  NA 141 139 140  K 4.0 3.9 3.9  CL 112* 110 110  CO2 20* 22 25  GLUCOSE 98 106* 93  BUN 32* 28* 23*  CREATININE 1.00 0.82 1.03  CALCIUM 8.9 8.9 8.6*   Liver Function Tests:  Recent Labs  08/26/15 1042 11/04/15 1537 02/21/16 1823  AST 19 16 19   ALT 14 12 14*  ALKPHOS 97 104 123  BILITOT 0.8 0.6 0.6  PROT 6.7 6.7 6.8  ALBUMIN 4.2 4.3 3.4*   No results for input(s): LIPASE, AMYLASE in the last 8760 hours. No results for input(s): AMMONIA in the last 8760 hours. CBC:  Recent Labs  08/26/15 1042 11/04/15 1537  01/17/16 0217  04/09/16 0643 05/04/16 0619 07/27/16 0927  WBC 7.3 9.4  < > 10.4  < > 8.4 7.6 7.4  NEUTROABS 4.6 6.5  --  8.5*  --   --   --   --   HGB  --   --   < > 12.0*  < > 13.0 13.2 12.7*  HCT 42.8 40.7  < > 37.6*  < > 41.7 41.1 38.6*  MCV 94 93  < >  95.9  < > 97.4 94.9 95.3  PLT 186 202  < > 165  < > 207 181 196  < > = values in this interval not displayed. Lipid Panel:  Recent Labs  08/26/15 1042  CHOL 144  HDL 49  LDLCALC 68  TRIG 135  CHOLHDL 2.9   Lab Results  Component Value Date   HGBA1C  2020/08/708    5.3 (NOTE) The ADA recommends the following therapeutic goal for glycemic control related to Hgb A1c measurement: Goal of therapy: <6.5 Hgb A1c  Reference: American Diabetes Association: Clinical  Practice Recommendations 2010, Diabetes Care, 2010, 33: (Suppl  1).    Assessment/Plan 1. Baker's cyst, left - left leg stiff and not bending well due to fluid accumulation again, not really painful -requests drainage and injection from same physician who did it before (put in referral)--his daughter will need to be contacted as patient currently unable to speak due to trach/laryngectomy - Ambulatory referral to Interventional Radiology  2. 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 Protein-calorie malnutrition, severe -has gained 5 lbs since taking liquid diet orally now -sees Dr. Constance Holster in f/u next Tuesday--hoping he can get ST and start eating more solids -he reports that he got a $600 bill from [facility] and does not remember getting any therapy up to this point--he did say his daughter is managing all of his paperwork and finances  4. Pharyngocutaneous fistula -has had several surgeries for this (4), cont tube feeding and liquid diet   Labs/tests ordered:   Orders Placed This Encounter  Procedures  . Ambulatory referral to Interventional Radiology    Referral Priority:   Routine    Referral Type:   Consultation    Referral Reason:   Specialty Services Required    Requested Specialty:   Interventional Radiology    Number of Visits Requested:   1   Next appt:  09/21/2016  Danil Wedge L. Muneeb Veras, D.O. [facility] [address], [place] [address] Cell Phone (Mon-Fri 8am-5pm):  [phone] On Call:  [phone] & follow prompts after 5pm & weekends Office Phone:  [phone] Office Fax:  [phone]

## 2016-08-05 ENCOUNTER — Other Ambulatory Visit: Payer: Self-pay | Admitting: Internal Medicine

## 2016-08-05 DIAGNOSIS — M7122 Synovial cyst of popliteal space [Baker], left knee: Secondary | ICD-10-CM

## 2016-08-13 ENCOUNTER — Ambulatory Visit
Admission: RE | Admit: 2016-08-13 | Discharge: 2016-08-13 | Disposition: A | Discharge status: Home / Self Care | Payer: Medicare Other | Source: Ambulatory Visit | Attending: Internal Medicine | Admitting: Internal Medicine

## 2016-08-13 DIAGNOSIS — M7122 Synovial cyst of popliteal space [Baker], left knee: Secondary | ICD-10-CM

## 2016-08-13 NOTE — Procedures (Signed)
Interventional Radiology Procedure Note  Procedure: US guided left bakers cyst aspiration and therapeutic injection.   Injection of 80mg  Kenalog (1cc) and 2cc of Bupivicaine.  ~25cc of viscous pale yellow fluid aspirated, compatible with synovial fluid.   Complications: None Recommendations:  - Routine wound care   Signed,  Dulcy Fanny. Earleen Newport, DO

## 2016-09-07 ENCOUNTER — Other Ambulatory Visit: Payer: Self-pay | Admitting: Internal Medicine

## 2016-09-21 ENCOUNTER — Ambulatory Visit (INDEPENDENT_AMBULATORY_CARE_PROVIDER_SITE_OTHER): Payer: Medicare Other | Admitting: Internal Medicine

## 2016-09-21 ENCOUNTER — Encounter: Payer: Self-pay | Admitting: Internal Medicine

## 2016-09-21 VITALS — BP 138/70 | HR 81 | Temp 98.7°F | Wt 183.0 lb

## 2016-09-21 DIAGNOSIS — Z7189 Other specified counseling: Secondary | ICD-10-CM

## 2016-09-21 DIAGNOSIS — G4700 Insomnia, unspecified: Secondary | ICD-10-CM | POA: Diagnosis not present

## 2016-09-21 DIAGNOSIS — I10 Essential (primary) hypertension: Secondary | ICD-10-CM

## 2016-09-21 DIAGNOSIS — J392 Other diseases of pharynx: Secondary | ICD-10-CM

## 2016-09-21 DIAGNOSIS — E43 Unspecified severe protein-calorie malnutrition: Secondary | ICD-10-CM

## 2016-09-21 DIAGNOSIS — C320 Malignant neoplasm of glottis: Secondary | ICD-10-CM

## 2016-09-21 DIAGNOSIS — E785 Hyperlipidemia, unspecified: Secondary | ICD-10-CM | POA: Diagnosis not present

## 2016-09-21 DIAGNOSIS — M7122 Synovial cyst of popliteal space [Baker], left knee: Secondary | ICD-10-CM | POA: Diagnosis not present

## 2016-09-21 MED ORDER — AMLODIPINE BESYLATE 2.5 MG PO TABS: 2.5000 mg | ORAL_TABLET | Freq: Every day | ORAL | 1 refills | Status: DC

## 2016-09-21 MED ORDER — ZOLPIDEM TARTRATE 5 MG PO TABS
5.0000 mg | ORAL_TABLET | Freq: Every evening | ORAL | 1 refills | Status: DC | PRN
Start: 2016-09-21 — End: 2016-10-27

## 2016-09-21 NOTE — Progress Notes (Signed)
Location:  [facility] clinic Provider:  Doc Mandala L. Mariea Clonts, D.O., C.M.D.  Code Status: DNR--advance directives reviewed today; has living will and his daughter is his hcpoa who is here today.  He requests to reinitiate his DNR code status.  Goals of Care:  Advanced Directives 09/21/2016  Does Patient Have a Medical Advance Directive? Yes  Type of Advance Directive Living will  Does patient want to make changes to medical advance directive? -  Copy of [place] in Chart? -  Pre-existing out of facility DNR order (yellow form or pink MOST form) -    Chief Complaint  Patient presents with  . Medical Management of Chronic Issues    42mth follow-up    HPI: Patient is a 83 y.o. male seen today for medical management of chronic diseases.  His daughter is with him today.  He is back to eating and drinking after fistula closed and has voicebox  He is up one lb since December.    Had baker's cyst aspiration and injection and much better.    He does not seem to have problems with ambien at home.  Does not sleep well otherwise.    Agrees to come off statin if lipids still great.    Reviewed code status.  Requests DNR.  Reentered.     Past Medical History:  Diagnosis Date  . A-fib ([place])   . Aortic valve insufficiency, acquired   . Arthritis   . BPH (benign prostatic hyperplasia)   . Cerebral ischemia   . Colon cancer [facility])     s/p partial colectomy  . Complication of anesthesia    "woke up w/confusion and hallucinations once after mitral valve OR"  . Coronary artery disease    50-70% LAD mitral valve repair 4+ yrs ago  . Depression   . Dyslipidemia   . Glaucoma   . Heart murmur   . Hernia   . High cholesterol   . History of echocardiogram    Echo 5/16:  Mild LVH, EF 50-55%, Gr 1 DD, septal HK, Ao sclerosis without stenosis, mild AI, MV repair ok with borderline mild MS (mean 4 mmHg), mild MR, mild LAE, mod RAE, mild TR, PASP 30 mmHg  . HTN (hypertension)    takes  Amlodipine daily  . Hx of cardiovascular stress test    Lexiscan Myoview 5/16:  Apical thinning, EF not gated, no ischemia. Low Risk  . Hyperlipidemia   . Major depression   . Mild cognitive impairment   . MVP (mitral valve prolapse)    S/P Rt mini thoractomy for Mitral Valve repair  . Nocturia   . Paroxysmal atrial fibrillation (HCC)    S/P Maze procedure  . 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 Prostate cancer ([place])   . 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 Shortness of breath dyspnea    with exertion  . Sleep disturbance   . Thoracic aortic aneurysm ([place])   . Urinary frequency   . Urinary urgency     Past Surgical History:  Procedure Laterality Date  . CARDIAC CATHETERIZATION    . CATARACT EXTRACTION W/ INTRAOCULAR LENS  IMPLANT, BILATERAL Bilateral   . COLECTOMY  2004   Dr [name]  . DIRECT LARYNGOSCOPY N/A 01/15/2016   Procedure: DIRECT LARYNGOSCOPY WITH BIOPSY AND FROZEN SECTION;  Surgeon: Izora Gala, MD;  Location: [place];  Service: ENT;  Laterality: N/A;  . FlexHD  patch repair of chest wall hernia.  01/28/2011   [name]  . GASTROSTOMY W/ FEEDING TUBE    . JOINT REPLACEMENT    . LARYNGETOMY N/A 01/15/2016   Procedure:  TOTAL LARYNGECTOMY;  Surgeon: Izora Gala, MD;  Location[facility] OR;  Service: ENT;  Laterality: N/A;  . MAZE  12/27/2008   left side lesion set  . MICROLARYNGOSCOPY Right 01/29/2015   Procedure: MICROLARYNGOSCOPY WITH BIOSPY OF RIGHT VOCAL CORD;  Surgeon: Izora Gala, MD;  Location: [place];  Service: ENT;  Laterality: Right;  . MITRAL VALVE REPAIR  12/27/2008   complex valvuloplasty with 79mm Memo 3D annuloplasty ring via right minithoracotomy  . PECTORALIS FLAP Left 05/04/2016   Procedure: PECTORALIS FLAP to neck with possible, split thickness skin graft;  Surgeon: Loel Lofty Dillingham, DO;  Location: [place];  Service: Plastics;  Laterality: Left;  . PROSTATE BIOPSY    . SKIN SPLIT GRAFT  Left 05/04/2016   Procedure: PECTORALIS MAJOR MYOCUTANEOUS FLAP RECONSTRUCTION OF PHARYNX AND SPLIT THICKNESS SKIN GRAFT;  Surgeon: Izora Gala, MD;  Location: [place];  Service: ENT;  Laterality: Left;  . TEE WITHOUT CARDIOVERSION N/A 01/03/2013   Procedure: TRANSESOPHAGEAL ECHOCARDIOGRAM (TEE);  Surgeon: Sueanne Margarita, MD;  Location: [place];  Service: Cardiovascular;  Laterality: N/A;  . TOTAL KNEE ARTHROPLASTY Right 2003  . TRACHEAL ESOPHAGEAL PUNCTURE REPAIR N/A 03/26/2016   Procedure: TRACHEAL ESOPHAGEAL PUNCTURE;  Surgeon: Izora Gala, MD;  Location: [facility] OR;  Service: ENT;  Laterality: N/A;  . TRACHEOESOPHAGEAL FISTULA REPAIR N/A 03/26/2016   Procedure: TRACHEO-ESOPHAGEAL   PUNCTURE,FISTULAR CLOSURE;  Surgeon: Izora Gala, MD;  Location: [facility] OR;  Service: ENT;  Laterality: N/A;  . TRACHEOESOPHAGEAL FISTULA REPAIR N/A 04/09/2016   Procedure: FISTULA REPAIR WITH  MUSCLE ROTATION FLAP;  Surgeon: Izora Gala, MD;  Location: [place];  Service: ENT;  Laterality: N/A;  . TRACHEOESOPHAGEAL FISTULA REPAIR N/A 07/27/2016   Procedure: CLOSURE OF FISTULA;  Surgeon: Izora Gala, MD;  Location: [place];  Service: ENT;  Laterality: N/A;  . TYMPANOPLASTY  1967   "? side"    Allergies  Allergen Reactions  . Citalopram Nausea Only  . Trazodone And Nefazodone Other (See Comments)    Dry mouth  . Zoloft [Sertraline Hcl] Other (See Comments)    dizzy    Allergies as of 09/21/2016      Reactions   Citalopram Nausea Only   Trazodone And Nefazodone Other (See Comments)   Dry mouth   Zoloft [sertraline Hcl] Other (See Comments)   dizzy      Medication List       Accurate as of 09/21/16 10:55 AM. Always use your most recent med list.          amLODipine 2.5 MG tablet Commonly known as:  NORVASC Place 1 tablet (2.5 mg total) into feeding tube daily.   atorvastatin 40 MG tablet Commonly known as:  LIPITOR OVERDUE for cholesterol labs, 1 tablet by mouth daily at 6 pm for cholesterol   ibuprofen  100 MG/5ML suspension Commonly known as:  ADVIL,MOTRIN Give 65mls via tube every 6 hours as needed for pain   Vitamin D 2000 units Caps Give 1 capsule by tube daily.   zolpidem 5 MG tablet Commonly known as:  AMBIEN Place 1 tablet (5 mg total) into feeding tube at bedtime as needed for sleep.       Review of Systems:  Review of Systems  Constitutional: Positive for malaise/fatigue. Negative for chills and fever.  HENT: Positive  for hearing loss.        Fistula has closed; now has voicebox in place, but unable to talk with it--not getting ongoing speech therapy  Eyes: Negative for blurred vision.  Respiratory: Negative for cough and shortness of breath.   Cardiovascular: Negative for chest pain, palpitations and leg swelling.  Gastrointestinal: Negative for abdominal pain, blood in stool, constipation and melena.  Genitourinary: Negative for dysuria.  Musculoskeletal: Positive for joint pain. Negative for falls.       Knee improved after aspiration and injection of baker's cyst  Skin: Negative for itching and rash.  Neurological: Positive for weakness. Negative for dizziness and loss of consciousness.  Endo/Heme/Allergies: Bruises/bleeds easily.  Psychiatric/Behavioral: Negative for depression and memory loss. The patient is nervous/anxious and has insomnia.     Health Maintenance  Topic Date Due  . ZOSTAVAX  02/09/1987  . TETANUS/TDAP  08/11/2023  . INFLUENZA VACCINE  Completed  . PNA vac Low Risk Adult  Completed    Physical Exam: Vitals:   09/21/16 1027  BP: 138/70  Pulse: 81  Temp: 98.7 F (37.1 C)  TempSrc: Oral  SpO2: 98%  Weight: 183 lb (83 kg)   Body mass index is 22.87 kg/m. Physical Exam  Constitutional: He is oriented to person, place, and time. He appears well-developed. No distress.  Looks healthier  HENT:  Head: Normocephalic and atraumatic.  Neck:  Artificial larynx in place  Cardiovascular: Normal rate, regular rhythm, normal heart sounds and  intact distal pulses.   Pulmonary/Chest: Effort normal and breath sounds normal. No respiratory distress.  Abdominal:  PEG in place, but not getting used  Musculoskeletal:  Ambulates with cane for balance and wearing brace on left knee  Neurological: He is alert and oriented to person, place, and time.  Skin: Skin is warm and dry.  Psychiatric: He has a normal mood and affect.    Labs reviewed: Basic Metabolic Panel:  Recent Labs  04/09/16 0643 05/04/16 0619 07/27/16 1000  NA 141 139 140  K 4.0 3.9 3.9  CL 112* 110 110  CO2 20* 22 25  GLUCOSE 98 106* 93  BUN 32* 28* 23*  CREATININE 1.00 0.82 1.03  CALCIUM 8.9 8.9 8.6*   Liver Function Tests:  Recent Labs  11/04/15 1537 02/21/16 1823  AST 16 19  ALT 12 14*  ALKPHOS 104 123  BILITOT 0.6 0.6  PROT 6.7 6.8  ALBUMIN 4.3 3.4*   No results for input(s): LIPASE, AMYLASE in the last 8760 hours. No results for input(s): AMMONIA in the last 8760 hours. CBC:  Recent Labs  11/04/15 1537  01/17/16 0217  04/09/16 0643 05/04/16 0619 07/27/16 0927  WBC 9.4  < > 10.4  < > 8.4 7.6 7.4  NEUTROABS 6.5  --  8.5*  --   --   --   --   HGB  --   < > 12.0*  < > 13.0 13.2 12.7*  HCT 40.7  < > 37.6*  < > 41.7 41.1 38.6*  MCV 93  < > 95.9  < > 97.4 94.9 95.3  PLT 202  < > 165  < > 207 181 196  < > = values in this interval not displayed. Lipid Panel: No results for input(s): CHOL, HDL, LDLCALC, TRIG, CHOLHDL, LDLDIRECT in the last 8760 hours. Lab Results  Component Value Date   HGBA1C  2020/03/809    5.3 (NOTE) The ADA recommends the following therapeutic goal for glycemic control related to Hgb A1c  measurement: Goal of therapy: <6.5 Hgb A1c  Reference: American Diabetes Association: Clinical Practice Recommendations 2010, Diabetes Care, 2010, 33: (Suppl  1).     Assessment/Plan 1. 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 Essential hypertension - bp well controlled, cont same regimen - amLODipine (NORVASC) 2.5 MG tablet; Take 1 tablet (2.5 mg total) by mouth daily.  Dispense: 90 tablet; Refill: 1 - DNR (Do Not Resuscitate)  3. Baker's cyst, left - improved pain and ambulation s/p aspiration and injection - DNR (Do Not Resuscitation)  4. Protein-calorie malnutrition, severe -has gained one lb since last time; eating much better and drinking--does have some dysphagia and has to be careful due to reconstruction of his throat - DNR (Do Not Resuscitate)  5. Pharyngocutaneous fistula - resolved at last - DNR (Do Not Resuscitate)  6. Hyperlipidemia, unspecified hyperlipidemia type - lipids have been very well controlled, but seems unnecessary to keep him on statin long term--will check lipids fasting before his CPE and AWV in May (likely will stop the statin as he is eating liberally and needs good QOL) - DNR (Do Not Resuscitate) - Lipid panel; Future  7. Insomnia, unspecified type - zolpidem (AMBIEN) 5 MG tablet; Take 1 tablet (5 mg total) by mouth at bedtime as needed for sleep.  Dispense: 30 tablet; Refill: 1 - DNR (Do Not Resuscitate) -risks reviewed but benefits of good sleep felt to outweight risks by pt and his daughter  66. Advance care planning -see above, approximately 20 mins spent discussing goals of care and wishes, DNR reordered  Labs/tests ordered:   Orders Placed This Encounter  Procedures  . Lipid panel    Standing Status:   Future    Standing Expiration Date:   09/21/2017    Order Specific Question:   Has the patient fasted?    Answer:   Yes  . DNR (Do Not Resuscitate)    Order Specific Question:   In the event of cardiac or respiratory ARREST    Answer:   Do not call a "code blue"    Order Specific Question:   In  the event of cardiac or respiratory ARREST    Answer:   Do not perform Intubation, CPR, defibrillation or ACLS    Order Specific Question:   In the event of cardiac or respiratory ARREST    Answer:   Use medication by any route, position, wound care, and other measures to relive pain and suffering. May use oxygen, suction and manual treatment of airway obstruction as needed for comfort.   Next appt:  12/31/2016   Timothy Suarez L. Briena Swingler, D.O. [facility] [address], [place] [address] Cell Phone (Mon-Fri 8am-5pm):  [phone] On Call:  [phone] & follow prompts after 5pm & weekends Office Phone:  [phone] Office Fax:  [phone]

## 2016-09-22 ENCOUNTER — Other Ambulatory Visit: Payer: Self-pay | Admitting: Internal Medicine

## 2016-09-22 DIAGNOSIS — G4700 Insomnia, unspecified: Secondary | ICD-10-CM

## 2016-10-01 ENCOUNTER — Other Ambulatory Visit (HOSPITAL_COMMUNITY): Payer: Self-pay | Admitting: Otolaryngology

## 2016-10-01 DIAGNOSIS — J392 Other diseases of pharynx: Secondary | ICD-10-CM

## 2016-10-08 ENCOUNTER — Encounter (HOSPITAL_COMMUNITY): Payer: Self-pay | Admitting: Interventional Radiology

## 2016-10-08 ENCOUNTER — Ambulatory Visit (HOSPITAL_COMMUNITY)
Admission: RE | Admit: 2016-10-08 | Discharge: 2016-10-08 | Disposition: A | Discharge status: Home / Self Care | Payer: Medicare Other | Source: Ambulatory Visit | Attending: Otolaryngology | Admitting: Otolaryngology

## 2016-10-08 DIAGNOSIS — Z431 Encounter for attention to gastrostomy: Secondary | ICD-10-CM | POA: Diagnosis not present

## 2016-10-08 DIAGNOSIS — J392 Other diseases of pharynx: Secondary | ICD-10-CM

## 2016-10-08 HISTORY — PX: IR GENERIC HISTORICAL: IMG1180011

## 2016-10-08 MED ORDER — LIDOCAINE VISCOUS 2 % MT SOLN
OROMUCOSAL | Status: AC
  Filled 2016-10-08: qty 15

## 2016-10-08 NOTE — Procedures (Signed)
Successful bedside removal of intact pull through G-tube. No immediate post procedural complications.  Jay Yuya Vanwingerden, MD Pager #: [phone]  

## 2016-10-16 IMAGING — CR DG CHEST 1V PORT
1 series · 1 of 1 positions shown · non-contrast
Comparison: 01/15/2016

CLINICAL DATA: Tube placement.

EXAM:
PORTABLE CHEST 1 VIEW

[AP]
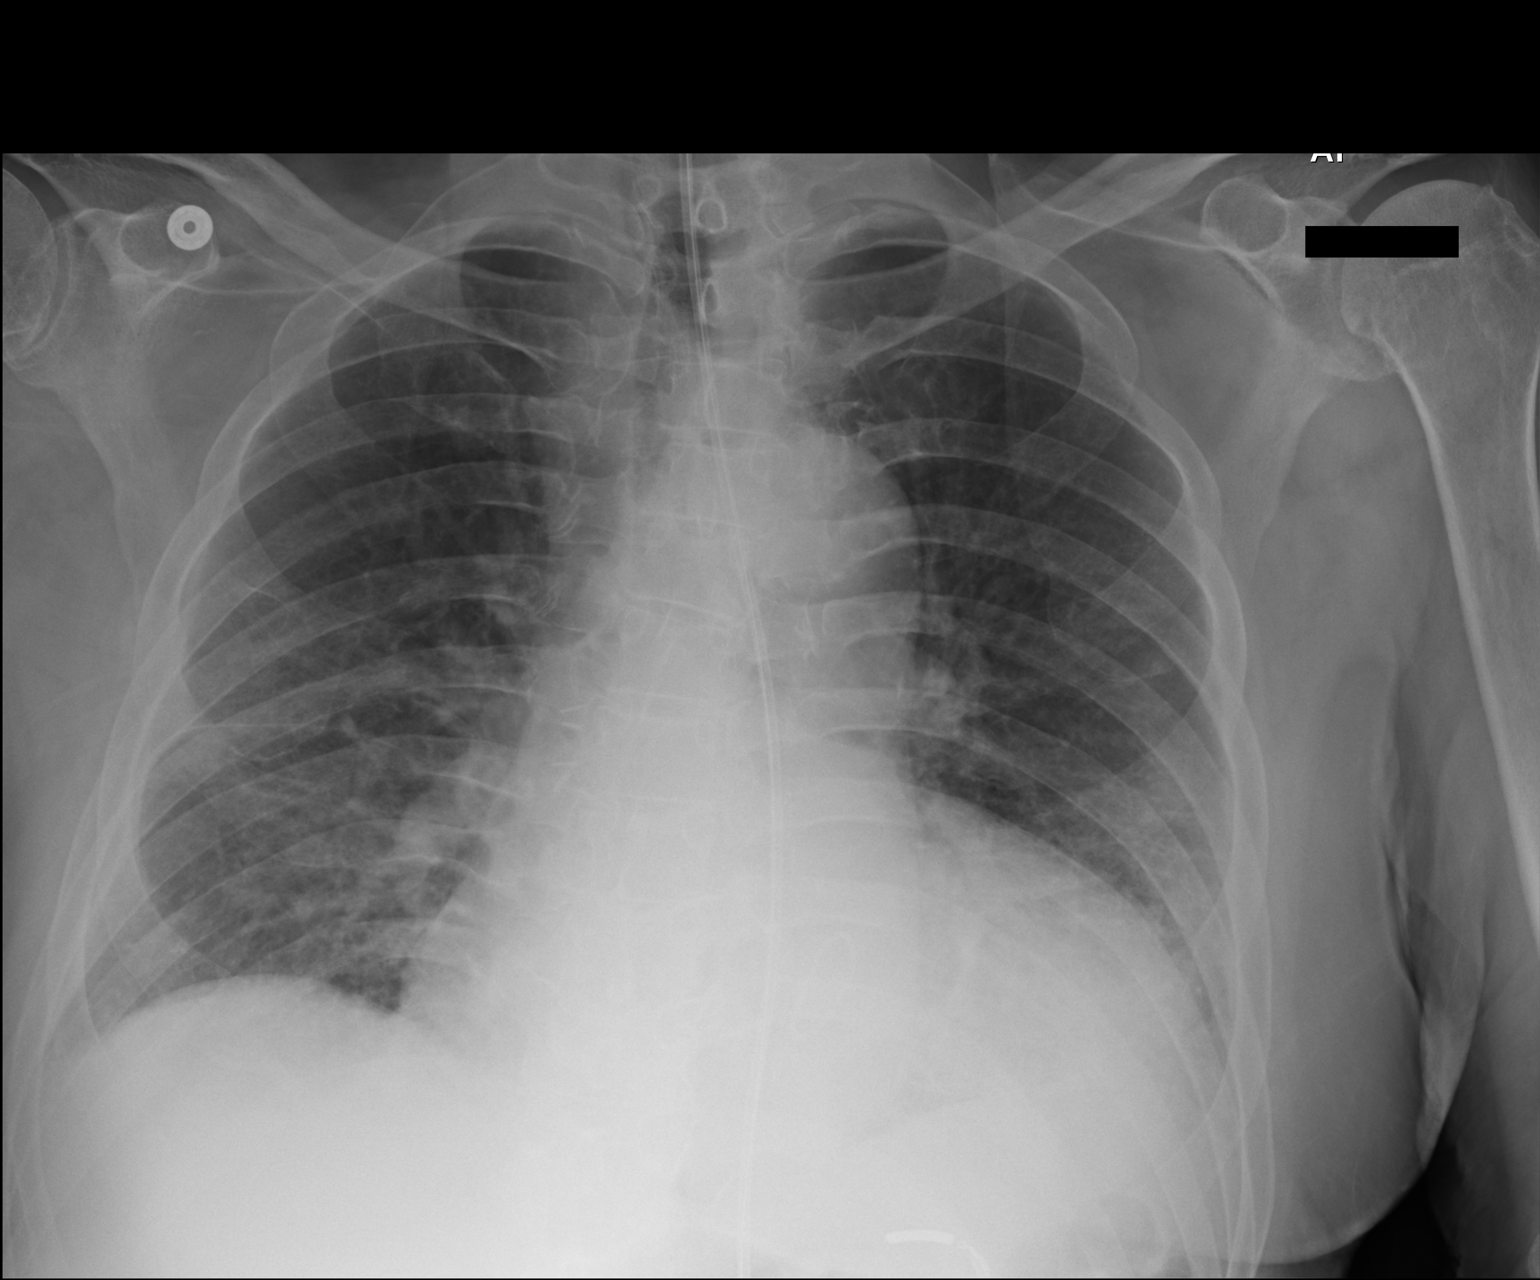

[1 of 1 positions shown; findings below may reference images not displayed]

FINDINGS: Portable AP chest at 7761 hours shows a small bore catheter
extending down over the mediastinum with the tip apparently
positioned in the gastric fundus. The cardio pericardial silhouette
is enlarged. Basilar chronic atelectasis or scarring noted.
IMPRESSION: Apparent feeding tube placement. Although the entire course of the
catheter has not been included on the film, the radiopaque tip does
project over the expected location of the gastric fundus.

## 2016-10-17 IMAGING — CR DG ABD PORTABLE 1V
1 series · 1 of 1 positions shown · non-contrast
Comparison: 02/22/2016

CLINICAL DATA: NG tube placement

EXAM:
PORTABLE ABDOMEN - 1 VIEW

[AP]
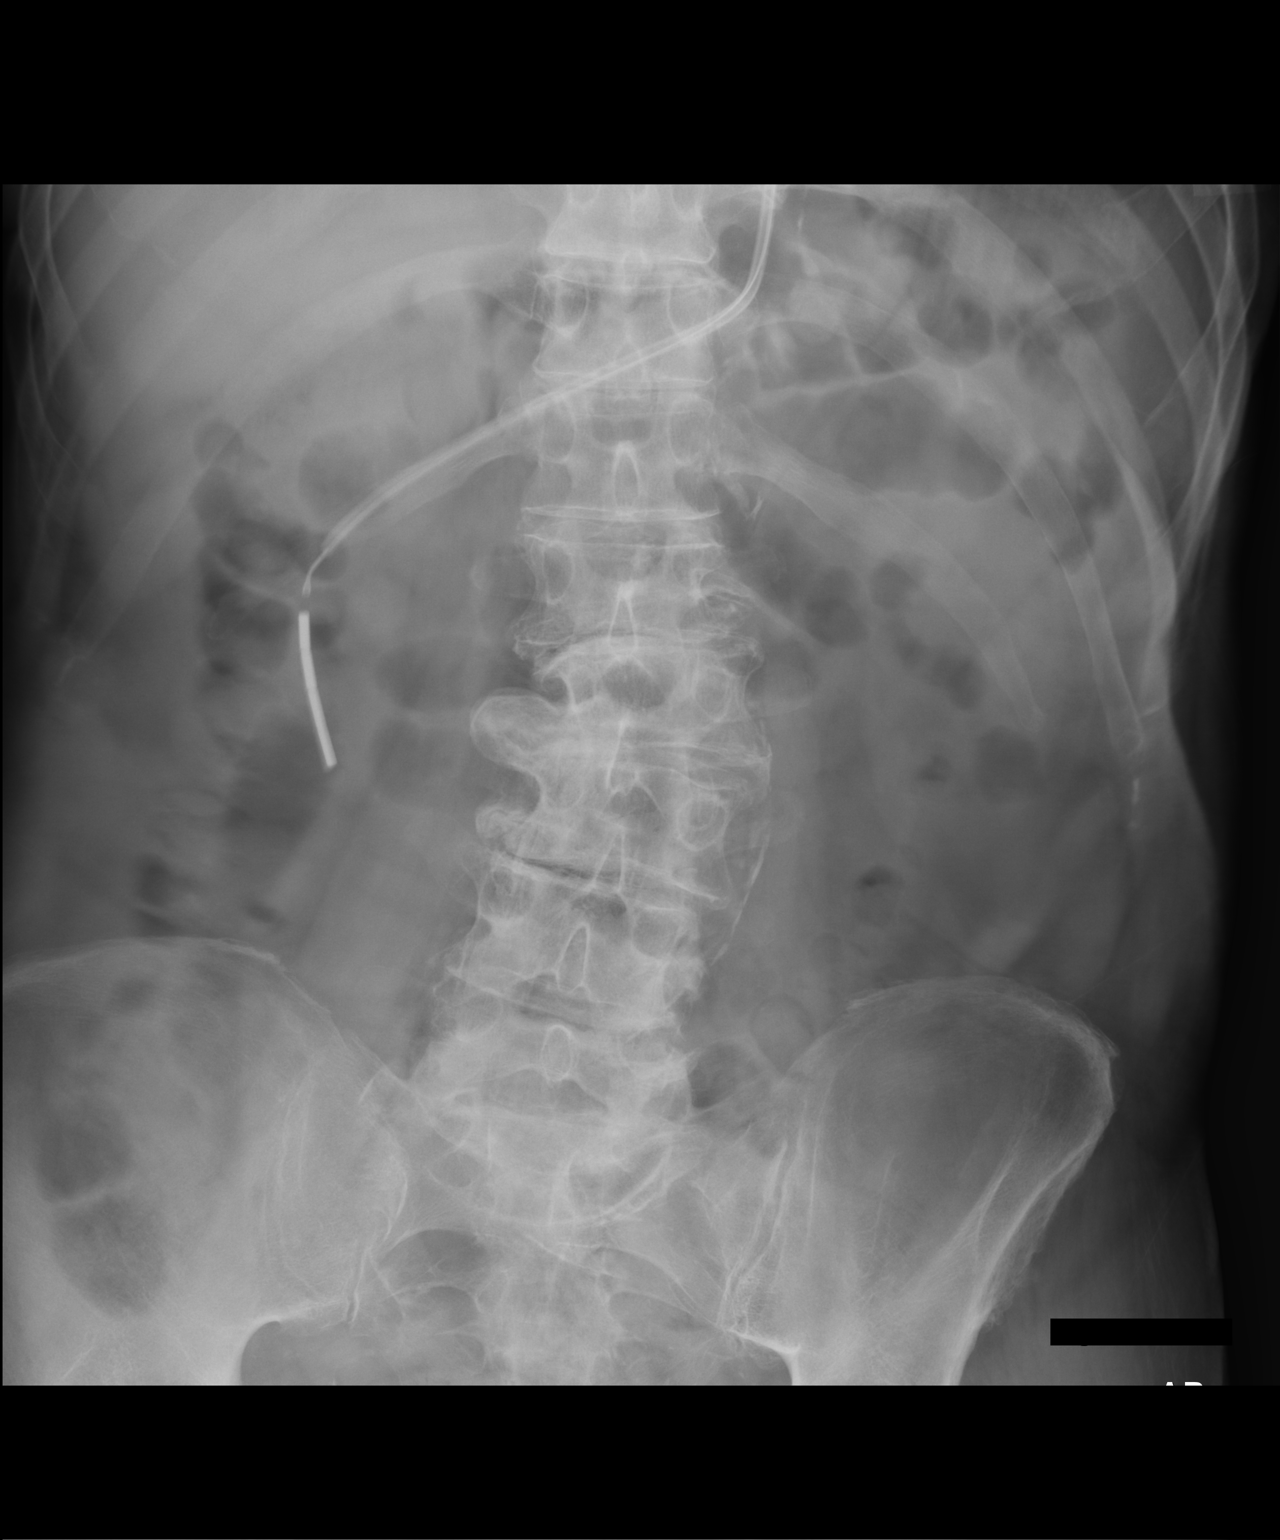

[1 of 1 positions shown; findings below may reference images not displayed]

FINDINGS: Enteric tube placed with tip in the right upper quadrant consistent
with location in the proximal duodenum. Scattered gas and stool
throughout the colon. No small or large distention. Aortic
calcifications. Degenerative changes in spine.
IMPRESSION: Enteric tube tip is in the right abdomen consistent with location of
the proximal duodenum. Aortic atherosclerosis.

## 2016-10-18 IMAGING — CR DG ABD PORTABLE 1V
1 series · 1 of 1 positions shown · non-contrast
Comparison: Same day

CLINICAL DATA: Feeding tube check

EXAM:
PORTABLE ABDOMEN - 1 VIEW

[AP]
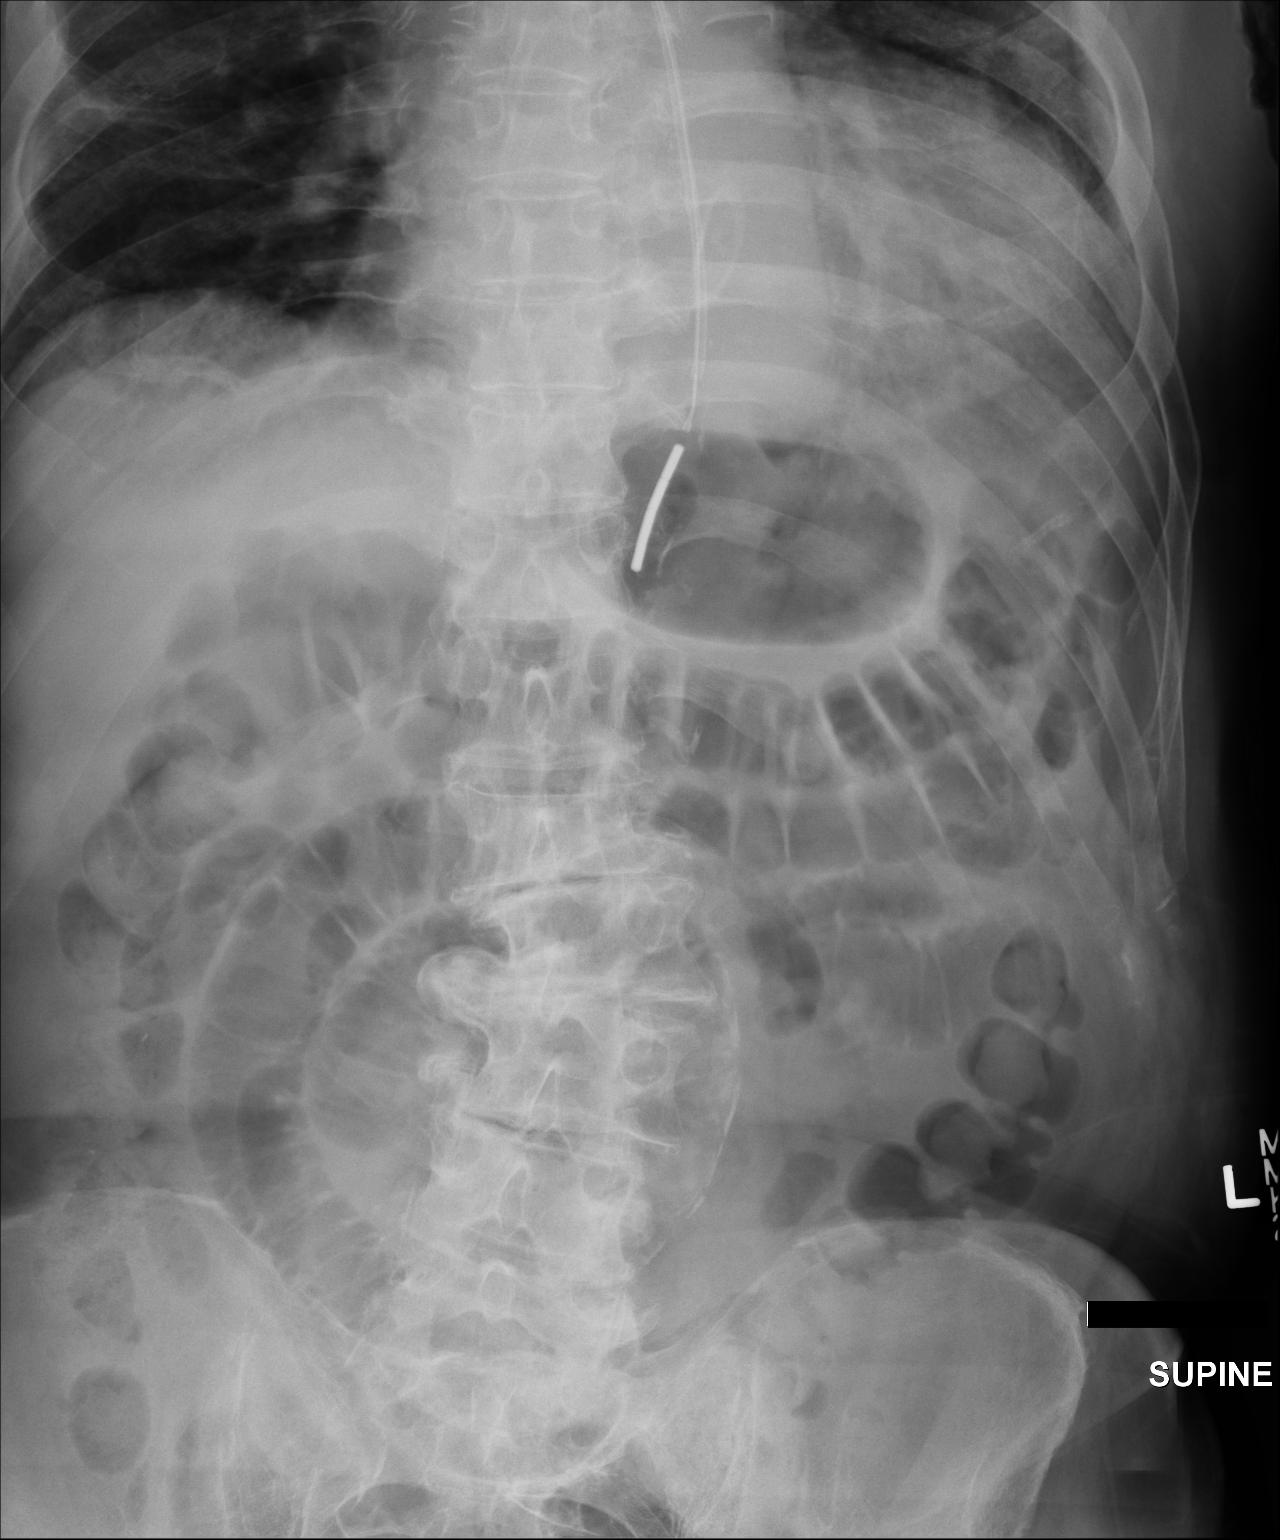

[1 of 1 positions shown; findings below may reference images not displayed]

FINDINGS: There is a feeding tube with the metallic portion just beyond the
gastroesophageal junction. There is gaseous distention of small
bowel and colon concerning for an ileus. There is no evidence of
pneumoperitoneum, portal venous gas or pneumatosis. There are no
pathologic calcifications along the expected course of the ureters.
There is abdominal aortic atherosclerosis. The osseous structures
are unremarkable.
IMPRESSION: Feeding tube with the metallic portion just be on the
gastroesophageal junction. Recommend advancing the feeding tube 10
cm.

## 2016-10-27 ENCOUNTER — Other Ambulatory Visit: Payer: Self-pay | Admitting: Internal Medicine

## 2016-10-27 DIAGNOSIS — G4700 Insomnia, unspecified: Secondary | ICD-10-CM

## 2016-11-10 ENCOUNTER — Telehealth: Payer: Self-pay | Admitting: *Deleted

## 2016-11-10 DIAGNOSIS — M7122 Synovial cyst of popliteal space [Baker], left knee: Secondary | ICD-10-CM

## 2016-11-10 NOTE — Telephone Encounter (Signed)
Patient daughter, Jackelyn Poling called and stated that patient needs the Baker's Cyst drained again. Goes to [place] (interventional Radiology). Does not want to come in for appointment, just wants a referral. Daughter stated that it is the same knee as before. Please Advise.

## 2016-11-10 NOTE — Telephone Encounter (Signed)
Order placed

## 2016-11-10 NOTE — Telephone Encounter (Signed)
Ok to duplicate same referral done most recently.

## 2016-11-11 ENCOUNTER — Other Ambulatory Visit: Payer: Self-pay | Admitting: Internal Medicine

## 2016-11-11 DIAGNOSIS — M7122 Synovial cyst of popliteal space [Baker], left knee: Secondary | ICD-10-CM

## 2016-11-12 ENCOUNTER — Ambulatory Visit
Admission: RE | Admit: 2016-11-12 | Discharge: 2016-11-12 | Disposition: A | Discharge status: Home / Self Care | Payer: Medicare Other | Source: Ambulatory Visit | Attending: Internal Medicine | Admitting: Internal Medicine

## 2016-11-12 DIAGNOSIS — M7122 Synovial cyst of popliteal space [Baker], left knee: Secondary | ICD-10-CM

## 2016-11-26 ENCOUNTER — Other Ambulatory Visit: Payer: Self-pay | Admitting: Nurse Practitioner

## 2016-11-26 DIAGNOSIS — G4700 Insomnia, unspecified: Secondary | ICD-10-CM

## 2016-12-06 ENCOUNTER — Observation Stay (HOSPITAL_COMMUNITY)
Admission: EM | Admit: 2016-12-06 | Discharge: 2016-12-08 | Disposition: A | Discharge status: Home / Self Care | Payer: Medicare Other | Attending: Internal Medicine | Admitting: Internal Medicine

## 2016-12-06 ENCOUNTER — Emergency Department (HOSPITAL_COMMUNITY): Payer: Medicare Other

## 2016-12-06 ENCOUNTER — Encounter (HOSPITAL_COMMUNITY): Payer: Self-pay | Admitting: Emergency Medicine

## 2016-12-06 DIAGNOSIS — J810 Acute pulmonary edema: Secondary | ICD-10-CM | POA: Diagnosis not present

## 2016-12-06 DIAGNOSIS — I509 Heart failure, unspecified: Secondary | ICD-10-CM

## 2016-12-06 DIAGNOSIS — I480 Paroxysmal atrial fibrillation: Secondary | ICD-10-CM | POA: Diagnosis present

## 2016-12-06 DIAGNOSIS — F329 Major depressive disorder, single episode, unspecified: Secondary | ICD-10-CM | POA: Diagnosis not present

## 2016-12-06 DIAGNOSIS — M1990 Unspecified osteoarthritis, unspecified site: Secondary | ICD-10-CM | POA: Insufficient documentation

## 2016-12-06 DIAGNOSIS — I10 Essential (primary) hypertension: Secondary | ICD-10-CM | POA: Diagnosis not present

## 2016-12-06 DIAGNOSIS — R059 Cough, unspecified: Secondary | ICD-10-CM

## 2016-12-06 DIAGNOSIS — J189 Pneumonia, unspecified organism: Secondary | ICD-10-CM | POA: Diagnosis not present

## 2016-12-06 DIAGNOSIS — R351 Nocturia: Secondary | ICD-10-CM | POA: Diagnosis not present

## 2016-12-06 DIAGNOSIS — G3184 Mild cognitive impairment, so stated: Secondary | ICD-10-CM | POA: Insufficient documentation

## 2016-12-06 DIAGNOSIS — R0602 Shortness of breath: Secondary | ICD-10-CM

## 2016-12-06 DIAGNOSIS — I4581 Long QT syndrome: Secondary | ICD-10-CM | POA: Insufficient documentation

## 2016-12-06 DIAGNOSIS — I712 Thoracic aortic aneurysm, without rupture: Secondary | ICD-10-CM | POA: Insufficient documentation

## 2016-12-06 DIAGNOSIS — E7800 Pure hypercholesterolemia, unspecified: Secondary | ICD-10-CM | POA: Diagnosis not present

## 2016-12-06 DIAGNOSIS — J811 Chronic pulmonary edema: Principal | ICD-10-CM | POA: Diagnosis present

## 2016-12-06 DIAGNOSIS — N401 Enlarged prostate with lower urinary tract symptoms: Secondary | ICD-10-CM | POA: Diagnosis not present

## 2016-12-06 DIAGNOSIS — I351 Nonrheumatic aortic (valve) insufficiency: Secondary | ICD-10-CM | POA: Diagnosis not present

## 2016-12-06 DIAGNOSIS — R05 Cough: Secondary | ICD-10-CM

## 2016-12-06 LAB — URINALYSIS, ROUTINE W REFLEX MICROSCOPIC
BILIRUBIN URINE: NEGATIVE
Glucose, UA: NEGATIVE mg/dL
Hgb urine dipstick: NEGATIVE
Ketones, ur: NEGATIVE mg/dL
Leukocytes, UA: NEGATIVE
NITRITE: NEGATIVE
PROTEIN: NEGATIVE mg/dL
SPECIFIC GRAVITY, URINE: 1.023 (ref 1.005–1.030)
pH: 6 (ref 5.0–8.0)

## 2016-12-06 LAB — COMPREHENSIVE METABOLIC PANEL
ALT: 19 U/L (ref 17–63)
AST: 21 U/L (ref 15–41)
Albumin: 2.6 g/dL — ABNORMAL LOW (ref 3.5–5.0)
Alkaline Phosphatase: 64 U/L (ref 38–126)
Anion gap: 8 (ref 5–15)
BUN: 22 mg/dL — ABNORMAL HIGH (ref 6–20)
CHLORIDE: 109 mmol/L (ref 101–111)
CO2: 21 mmol/L — AB (ref 22–32)
CREATININE: 0.88 mg/dL (ref 0.61–1.24)
Calcium: 7.8 mg/dL — ABNORMAL LOW (ref 8.9–10.3)
GFR calc non Af Amer: >60 mL/min (ref >60)
Glucose, Bld: 88 mg/dL (ref 65–99)
POTASSIUM: 3.8 mmol/L (ref 3.5–5.1)
SODIUM: 138 mmol/L (ref 135–145)
Total Bilirubin: 1.3 mg/dL — ABNORMAL HIGH (ref 0.3–1.2)
Total Protein: 5.9 g/dL — ABNORMAL LOW (ref 6.5–8.1)

## 2016-12-06 LAB — CBC WITH DIFFERENTIAL/PLATELET
BASOS PCT: 0 %
Basophils Absolute: 0 10*3/uL (ref 0.0–0.1)
EOS ABS: 0.2 10*3/uL (ref 0.0–0.7)
Eosinophils Relative: 2 %
HCT: 41.7 % (ref 39.0–52.0)
HEMOGLOBIN: 13.9 g/dL (ref 13.0–17.0)
Lymphocytes Relative: 14 %
Lymphs Abs: 1.4 10*3/uL (ref 0.7–4.0)
MCH: 32.5 pg (ref 26.0–34.0)
MCHC: 33.3 g/dL (ref 30.0–36.0)
MCV: 97.4 fL (ref 78.0–100.0)
Monocytes Absolute: 0.6 10*3/uL (ref 0.1–1.0)
Monocytes Relative: 7 %
NEUTROS PCT: 77 %
Neutro Abs: 7.5 10*3/uL (ref 1.7–7.7)
PLATELETS: 156 10*3/uL (ref 150–400)
RBC: 4.28 MIL/uL (ref 4.22–5.81)
RDW: 13.3 % (ref 11.5–15.5)
WBC: 9.8 10*3/uL (ref 4.0–10.5)

## 2016-12-06 LAB — TROPONIN I

## 2016-12-06 LAB — CBC
HCT: 43.1 % (ref 39.0–52.0)
HEMOGLOBIN: 14.3 g/dL (ref 13.0–17.0)
MCH: 32.6 pg (ref 26.0–34.0)
MCHC: 33.2 g/dL (ref 30.0–36.0)
MCV: 98.2 fL (ref 78.0–100.0)
PLATELETS: 159 10*3/uL (ref 150–400)
RBC: 4.39 MIL/uL (ref 4.22–5.81)
RDW: 13.2 % (ref 11.5–15.5)
WBC: 8.5 10*3/uL (ref 4.0–10.5)

## 2016-12-06 LAB — PHOSPHORUS: PHOSPHORUS: 2.9 mg/dL (ref 2.5–4.6)

## 2016-12-06 LAB — CREATININE, SERUM
CREATININE: 0.87 mg/dL (ref 0.61–1.24)
GFR calc Af Amer: >60 mL/min (ref >60)

## 2016-12-06 LAB — MAGNESIUM: Magnesium: 2.1 mg/dL (ref 1.7–2.4)

## 2016-12-06 LAB — BRAIN NATRIURETIC PEPTIDE: B NATRIURETIC PEPTIDE 5: 286.9 pg/mL — AB (ref 0.0–100.0)

## 2016-12-06 MED ORDER — SODIUM CHLORIDE 0.9 % IV BOLUS (SEPSIS)
500.0000 mL | Freq: Once | INTRAVENOUS | Status: AC
Start: 2016-12-06 — End: 2016-12-06
  Administered 2016-12-06: 500 mL via INTRAVENOUS

## 2016-12-06 MED ORDER — SODIUM CHLORIDE 0.9 % IV SOLN
INTRAVENOUS | Status: DC
  Administered 2016-12-06: 10:00:00 via INTRAVENOUS

## 2016-12-06 MED ORDER — FUROSEMIDE 10 MG/ML IJ SOLN
40.0000 mg | Freq: Two times a day (BID) | INTRAMUSCULAR | Status: DC
  Administered 2016-12-06 – 2016-12-07 (×2): 40 mg via INTRAVENOUS
  Filled 2016-12-06 (×2): qty 4

## 2016-12-06 MED ORDER — DIAZEPAM 5 MG PO TABS
5.0000 mg | ORAL_TABLET | Freq: Once | ORAL | Status: AC
  Administered 2016-12-06: 5 mg via ORAL
  Filled 2016-12-06: qty 1

## 2016-12-06 MED ORDER — HYDRALAZINE HCL 20 MG/ML IJ SOLN: 10.0000 mg | Freq: Three times a day (TID) | INTRAMUSCULAR | Status: DC | PRN

## 2016-12-06 MED ORDER — HEPARIN SODIUM (PORCINE) 5000 UNIT/ML IJ SOLN
5000.0000 [IU] | Freq: Three times a day (TID) | INTRAMUSCULAR | Status: DC
  Administered 2016-12-06 – 2016-12-08 (×4): 5000 [IU] via SUBCUTANEOUS
  Filled 2016-12-06 (×4): qty 1

## 2016-12-06 MED ORDER — ONDANSETRON HCL 4 MG/2ML IJ SOLN: 4.0000 mg | Freq: Four times a day (QID) | INTRAMUSCULAR | Status: DC | PRN

## 2016-12-06 MED ORDER — ATORVASTATIN CALCIUM 40 MG PO TABS
40.0000 mg | ORAL_TABLET | Freq: Every day | ORAL | Status: DC
  Administered 2016-12-07: 40 mg via ORAL
  Filled 2016-12-06 (×3): qty 1

## 2016-12-06 MED ORDER — AMLODIPINE BESYLATE 5 MG PO TABS
2.5000 mg | ORAL_TABLET | Freq: Every day | ORAL | Status: DC
  Administered 2016-12-06 – 2016-12-07 (×2): 2.5 mg via ORAL
  Filled 2016-12-06: qty 1

## 2016-12-06 MED ORDER — FUROSEMIDE 10 MG/ML IJ SOLN
40.0000 mg | Freq: Once | INTRAMUSCULAR | Status: AC
  Administered 2016-12-06: 40 mg via INTRAVENOUS
  Filled 2016-12-06: qty 4

## 2016-12-06 MED ORDER — ACETAMINOPHEN 325 MG PO TABS: 650.0000 mg | ORAL_TABLET | ORAL | Status: DC | PRN

## 2016-12-06 MED ORDER — METOPROLOL TARTRATE 5 MG/5ML IV SOLN: 5.0000 mg | Freq: Three times a day (TID) | INTRAVENOUS | Status: DC | PRN

## 2016-12-06 MED ORDER — MORPHINE SULFATE (PF) 4 MG/ML IV SOLN: 2.0000 mg | INTRAVENOUS | Status: DC | PRN

## 2016-12-06 MED ORDER — NITROGLYCERIN 2 % TD OINT
1.0000 [in_us] | TOPICAL_OINTMENT | Freq: Four times a day (QID) | TRANSDERMAL | Status: AC
  Administered 2016-12-07 (×2): 1 [in_us] via TOPICAL
  Filled 2016-12-06: qty 30

## 2016-12-06 NOTE — ED Provider Notes (Signed)
[facility] DEPT Provider Note   CSN: [id number] Arrival date & time: 12/06/16  0840     History   Chief Complaint Chief Complaint  Patient presents with  . Weakness    HPI Timothy Suarez is a 83 y.o. male.  83 year old male presents with worsening diffuse weakness times one month that has become acutely worse over the past 24-48 hours. Has been a decrease oral intake due to recent neck surgery. Patient has had a feeding tube in the past. He denies any fever, congestion but has had some cough. No dysuria or hematuria. No abdominal discomfort. No focality to his weakness. No recent medication changes. Does normally walk with assistance but family states that today he seems to be worsened normal. They're concerned that he is dehydrated and has brought him in for evaluation      Past Medical History:  Diagnosis Date  . A-fib ([place])   . Aortic valve insufficiency, acquired   . Arthritis   . BPH (benign prostatic hyperplasia)   . Cerebral ischemia   . Colon cancer [facility])     s/p partial colectomy  . Complication of anesthesia    "woke up w/confusion and hallucinations once after mitral valve OR"  . Coronary artery disease    50-70% LAD mitral valve repair 4+ yrs ago  . Depression   . Dyslipidemia   . Glaucoma   . Heart murmur   . Hernia   . High cholesterol   . History of echocardiogram    Echo 5/16:  Mild LVH, EF 50-55%, Gr 1 DD, septal HK, Ao sclerosis without stenosis, mild AI, MV repair ok with borderline mild MS (mean 4 mmHg), mild MR, mild LAE, mod RAE, mild TR, PASP 30 mmHg  . HTN (hypertension)    takes Amlodipine daily  . Hx of cardiovascular stress test    Lexiscan Myoview 5/16:  Apical thinning, EF not gated, no ischemia. Low Risk  . Hyperlipidemia   . Major depression   . Mild cognitive impairment   . MVP (mitral valve prolapse)    S/P Rt mini thoractomy for Mitral Valve repair  . Nocturia   . Paroxysmal atrial fibrillation (HCC)    S/P Maze  procedure  . 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 Prostate cancer ([place])   . 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 Shortness of breath dyspnea    with exertion  . Sleep disturbance   . Thoracic aortic aneurysm ([place])   . Urinary frequency   . Urinary urgency     Patient Active Problem List   Diagnosis Date Noted  . H/O laryngectomy 03/26/2016  . Protein-calorie malnutrition, severe 02/26/2016  . Pharyngocutaneous fistula 02/21/2016  . S/P laryngectomy 01/15/2016  . S/P biopsy 01/15/2016  . Vascular dementia with depressed mood 12/09/2015  . Baker's cyst of knee 12/09/2015  . Other fatigue 12/09/2015  . 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 Prostate cancer ([place]) 01/10/2015  . S/P MVR (mitral valve repair) 12/20/2014  . S/P Maze operation for atrial fibrillation 12/20/2014  . Obesity (BMI 30-39.9) 04/02/2014  . Prolonged Q-T interval on ECG 04/02/2014  . Mild cognitive impairment 04/02/2014  . Major depressive disorder, recurrent episode, mild ([place]) 02/08/2014  . Insomnia 02/08/2014  . Dizziness and giddiness 02/08/2014  . Generalized muscle weakness 02/08/2014  . Cerumen impaction 02/08/2014  . Glaucoma 02/08/2014  . Colon  cancer ([place])   . Coronary artery disease   . Moderate aortic insufficiency 01/03/2013  . Mild mitral regurgitation 01/03/2013  . MVP (mitral valve prolapse)   . Paroxysmal atrial fibrillation (HCC)   . HTN (hypertension)   . Depression   . chest wall hernia (lung hernia)     Past Surgical History:  Procedure Laterality Date  . CARDIAC CATHETERIZATION    . CATARACT EXTRACTION W/ INTRAOCULAR LENS  IMPLANT, BILATERAL Bilateral   . COLECTOMY  2004   Dr [name]  . DIRECT LARYNGOSCOPY N/A 01/15/2016   Procedure: DIRECT LARYNGOSCOPY WITH BIOPSY AND FROZEN SECTION;  Surgeon: Izora Gala, MD;  Location: [facility];  Service: ENT;  Laterality: N/A;  .  FlexHD patch repair of chest wall hernia.  01/28/2011   [name]  . GASTROSTOMY W/ FEEDING TUBE    . IR GENERIC HISTORICAL  10/08/2016   IR GASTROSTOMY TUBE REMOVAL 10/08/2016 Sandi Mariscal, MD [facility]-INTERV RAD  . JOINT REPLACEMENT    . LARYNGETOMY N/A 01/15/2016   Procedure:  TOTAL LARYNGECTOMY;  Surgeon: Izora Gala, MD;  Location: [facility] OR;  Service: ENT;  Laterality: N/A;  . MAZE  12/27/2008   left side lesion set  . MICROLARYNGOSCOPY Right 01/29/2015   Procedure: MICROLARYNGOSCOPY WITH BIOSPY OF RIGHT VOCAL CORD;  Surgeon: Izora Gala, MD;  Location: [facility];  Service: ENT;  Laterality: Right;  . MITRAL VALVE REPAIR  12/27/2008   complex valvuloplasty with 72mm Memo 3D annuloplasty ring via right minithoracotomy  . PECTORALIS FLAP Left 05/04/2016   Procedure: PECTORALIS FLAP to neck with possible, split thickness skin graft;  Surgeon: Loel Lofty Dillingham, DO;  Location: [place];  Service: Plastics;  Laterality: Left;  . PROSTATE BIOPSY    . SKIN SPLIT GRAFT Left 05/04/2016   Procedure: PECTORALIS MAJOR MYOCUTANEOUS FLAP RECONSTRUCTION OF PHARYNX AND SPLIT THICKNESS SKIN GRAFT;  Surgeon: Izora Gala, MD;  Location: [place];  Service: ENT;  Laterality: Left;  . TEE WITHOUT CARDIOVERSION N/A 01/03/2013   Procedure: TRANSESOPHAGEAL ECHOCARDIOGRAM (TEE);  Surgeon: Sueanne Margarita, MD;  Location: [place];  Service: Cardiovascular;  Laterality: N/A;  . TOTAL KNEE ARTHROPLASTY Right 2003  . TRACHEAL ESOPHAGEAL PUNCTURE REPAIR N/A 03/26/2016   Procedure: TRACHEAL ESOPHAGEAL PUNCTURE;  Surgeon: Izora Gala, MD;  Location: [facility] OR;  Service: ENT;  Laterality: N/A;  . TRACHEOESOPHAGEAL FISTULA REPAIR N/A 03/26/2016   Procedure: TRACHEO-ESOPHAGEAL   PUNCTURE,FISTULAR CLOSURE;  Surgeon: Izora Gala, MD;  Location: [facility] OR;  Service: ENT;  Laterality: N/A;  . TRACHEOESOPHAGEAL FISTULA REPAIR N/A 04/09/2016   Procedure: FISTULA REPAIR WITH  MUSCLE ROTATION FLAP;  Surgeon: Izora Gala, MD;  Location: [place];  Service: ENT;   Laterality: N/A;  . TRACHEOESOPHAGEAL FISTULA REPAIR N/A 07/27/2016   Procedure: CLOSURE OF FISTULA;  Surgeon: Izora Gala, MD;  Location: [place];  Service: ENT;  Laterality: N/A;  . TYMPANOPLASTY  1967   "? side"       Home Medications    Prior to Admission medications   Medication Sig Start Date End Date Taking? Authorizing Provider  amLODipine (NORVASC) 2.5 MG tablet Take 1 tablet (2.5 mg total) by mouth daily. 09/21/16   Tiffany L Reed, DO  atorvastatin (LIPITOR) 40 MG tablet OVERDUE for cholesterol labs, 1 tablet by mouth daily at 6 pm for cholesterol 09/07/16   Tiffany L Reed, DO  Cholecalciferol (VITAMIN D) 2000 units CAPS Give 1 capsule by tube daily.    Historical Provider, MD  ibuprofen (ADVIL,MOTRIN) 100 MG/5ML suspension Give 72mls  via tube every 6 hours as needed for pain    Historical Provider, MD  zolpidem (AMBIEN) 5 MG tablet PLACE 1 TABLET (5MG  TOTAL) INTO FEEDING TUBE AT BEDTIME IF NEEDED FOR SLEEP 11/26/16   Lauree Chandler, NP    Family History Family History  Problem Relation Age of Onset  . Cancer Mother     lymphoma  . Cancer Father     pancreatic  . Heart attack Neg Hx   . Stroke Neg Hx     Social History Social History  Substance Use Topics  . Smoking status: Never Smoker  . Smokeless tobacco: Never Used  . Alcohol use 0.0 oz/week     Comment: rare     Allergies   Citalopram; Trazodone and nefazodone; and Zoloft [sertraline hcl]   Review of Systems Review of Systems  All other systems reviewed and are negative.    Physical Exam Updated Vital Signs There were no vitals taken for this visit.  Physical Exam  Constitutional: He is oriented to person, place, and time. He appears well-developed and well-nourished.  Non-toxic appearance. No distress.  HENT:  Head: Normocephalic and atraumatic.  Eyes: Conjunctivae, EOM and lids are normal. Pupils are equal, round, and reactive to light.  Neck: Normal range of motion. Neck supple. No tracheal  deviation present. No thyroid mass present.  Cardiovascular: Normal rate, regular rhythm and normal heart sounds.  Exam reveals no gallop.   No murmur heard. Pulmonary/Chest: Effort normal. No stridor. No respiratory distress. He has decreased breath sounds in the right lower field and the left lower field. He has no wheezes. He has no rales.  Abdominal: Soft. Normal appearance and bowel sounds are normal. He exhibits no distension. There is no tenderness. There is no rebound and no CVA tenderness.  Musculoskeletal: Normal range of motion. He exhibits no edema or tenderness.  Neurological: He is alert and oriented to person, place, and time. He has normal strength. No cranial nerve deficit or sensory deficit. GCS eye subscore is 4. GCS verbal subscore is 5. GCS motor subscore is 6.  Skin: Skin is warm and dry. No abrasion and no rash noted.  Psychiatric: He has a normal mood and affect. His speech is normal and behavior is normal.  Nursing note and vitals reviewed.    ED Treatments / Results  Labs (all labs ordered are listed, but only abnormal results are displayed) Labs Reviewed  URINE CULTURE  CBC WITH DIFFERENTIAL/PLATELET  COMPREHENSIVE METABOLIC PANEL  TROPONIN I  URINALYSIS, ROUTINE W REFLEX MICROSCOPIC    EKG  EKG Interpretation None       Radiology No results found.  Procedures Procedures (including critical care time)  Medications Ordered in ED Medications  sodium chloride 0.9 % bolus 500 mL (not administered)  0.9 %  sodium chloride infusion (not administered)     Initial Impression / Assessment and Plan / ED Course  I have reviewed the triage vital signs and the nursing notes.  Pertinent labs & imaging results that were available during my care of the patient were reviewed by me and considered in my medical decision making (see chart for details).     Patient given Lasix 40 mg IV push for CHF. Will be admitted to the hospitalist service  Final Clinical  Impressions(s) / ED Diagnoses   Final diagnoses:  Cough    New Prescriptions New Prescriptions   No medications on file     Lacretia Leigh, MD 12/06/16 1251

## 2016-12-06 NOTE — ED Triage Notes (Signed)
Pt brought in by family for c/o generalized weakness ongoing for a long time that has increased in the past week. Pt lives in assisted living at [facility]. Per family thinks maybe pt is dehydrated even though pt does report that he eats and drinks.

## 2016-12-06 NOTE — Plan of Care (Signed)
Problem: Education: Goal: Knowledge of [facility] Education information/materials will improve Outcome: Progressing Variance: Communication barriers Comments: Patient has no vocal cords, hard of hearing

## 2016-12-06 NOTE — H&P (Signed)
[facility] History and Physical  Timothy Suarez BJS:[id number] DOB: [birth year] DOA: 12/06/2016  Referring physician:  PCP: Hollace Kinnier, DO   Chief Complaint: "He thought he wasn't going to be here anymore." - dgtr poa  HPI: Timothy Suarez is a 83 y.o. male  significant for history of atrial fibrillation, history of colon cancer, a blood pressure, heart hearing, focal cord cancer, status post salvage laryngectomy who presents with shortness of breath and weakness. Patient over the last less than 5 days began to progressively weak. Patient also previously and short of breath. Of note patient had a box removed from his laryngectomy by nose and throat recently. Patient was warned that she may have risk of aspiration for for 7 days. Patient continued to eat and drink as normal. Patient has a chronic productive cough which has not gotten worse. History of being a shallow breather and tachypnea at Baseline.  ED Course: Bolus of fluid. Followed by lasix. CXR showed pulm edema. Hospitalist consulted for admit.  Review of Systems:  As per HPI otherwise 10 point review of systems negative.    Past Medical History:  Diagnosis Date  . A-fib ([place])   . Aortic valve insufficiency, acquired   . Arthritis   . BPH (benign prostatic hyperplasia)   . Cerebral ischemia   . Colon cancer [facility])     s/p partial colectomy  . Complication of anesthesia    "woke up w/confusion and hallucinations once after mitral valve OR"  . Coronary artery disease    50-70% LAD mitral valve repair 4+ yrs ago  . Depression   . Dyslipidemia   . Glaucoma   . Heart murmur   . Hernia   . High cholesterol   . History of echocardiogram    Echo 5/16:  Mild LVH, EF 50-55%, Gr 1 DD, septal HK, Ao sclerosis without stenosis, mild AI, MV repair ok with borderline mild MS (mean 4 mmHg), mild MR, mild LAE, mod RAE, mild TR, PASP 30 mmHg  . HTN (hypertension)    takes Amlodipine daily  . Hx of cardiovascular stress  test    Lexiscan Myoview 5/16:  Apical thinning, EF not gated, no ischemia. Low Risk  . Hyperlipidemia   . Major depression   . Mild cognitive impairment   . MVP (mitral valve prolapse)    S/P Rt mini thoractomy for Mitral Valve repair  . Nocturia   . Paroxysmal atrial fibrillation (HCC)    S/P Maze procedure  . 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 Prostate cancer ([place])   . 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 Shortness of breath dyspnea    with exertion  . Sleep disturbance   . Thoracic aortic aneurysm ([place])   . Urinary frequency   . Urinary urgency    Past Surgical History:  Procedure Laterality Date  . CARDIAC CATHETERIZATION    . CATARACT EXTRACTION W/ INTRAOCULAR LENS  IMPLANT, BILATERAL Bilateral   . COLECTOMY  2004   Dr [name]  . DIRECT LARYNGOSCOPY N/A 01/15/2016   Procedure: DIRECT LARYNGOSCOPY WITH BIOPSY AND FROZEN SECTION;  Surgeon: Izora Gala, MD;  Location: [place];  Service: ENT;  Laterality: N/A;  . FlexHD patch repair of chest wall hernia.  01/28/2011   [name]  . GASTROSTOMY W/ FEEDING TUBE    . IR GENERIC HISTORICAL  10/08/2016   IR GASTROSTOMY TUBE REMOVAL 10/08/2016 Sandi Mariscal, MD [facility]-INTER[facility] RAD  .  JOINT REPLACEMENT    . LARYNGETOMY N/A 01/15/2016   Procedure:  TOTAL LARYNGECTOMY;  Surgeon: Izora Gala, MD;  Location: [facility] OR;  Service: ENT;  Laterality: N/A;  . MAZE  12/27/2008   left side lesion set  . MICROLARYNGOSCOPY Right 01/29/2015   Procedure: MICROLARYNGOSCOPY WITH BIOSPY OF RIGHT VOCAL CORD;  Surgeon: Izora Gala, MD;  Location: [place];  Service: ENT;  Laterality: Right;  . MITRAL VALVE REPAIR  12/27/2008   complex valvuloplasty with 3mm Memo 3D annuloplasty ring via right minithoracotomy  . PECTORALIS FLAP Left 05/04/2016   Procedure: PECTORALIS FLAP to neck with possible, split thickness skin graft;  Surgeon: Loel Lofty Dillingham, DO;  Location: [place];  Service: Plastics;   Laterality: Left;  . PROSTATE BIOPSY    . SKIN SPLIT GRAFT Left 05/04/2016   Procedure: PECTORALIS MAJOR MYOCUTANEOUS FLAP RECONSTRUCTION OF PHARYNX AND SPLIT THICKNESS SKIN GRAFT;  Surgeon: Izora Gala, MD;  Location: [place];  Service: ENT;  Laterality: Left;  . TEE WITHOUT CARDIOVERSION N/A 01/03/2013   Procedure: TRANSESOPHAGEAL ECHOCARDIOGRAM (TEE);  Surgeon: Sueanne Margarita, MD;  Location: [place];  Service: Cardiovascular;  Laterality: N/A;  . TOTAL KNEE ARTHROPLASTY Right 2003  . TRACHEAL ESOPHAGEAL PUNCTURE REPAIR N/A 03/26/2016   Procedure: TRACHEAL ESOPHAGEAL PUNCTURE;  Surgeon: Izora Gala, MD;  Location: [facility] OR;  Service: ENT;  Laterality: N/A;  . TRACHEOESOPHAGEAL FISTULA REPAIR N/A 03/26/2016   Procedure: TRACHEO-ESOPHAGEAL   PUNCTURE,FISTULAR CLOSURE;  Surgeon: Izora Gala, MD;  Location: [place];  Service: ENT;  Laterality: N/A;  . TRACHEOESOPHAGEAL FISTULA REPAIR N/A 04/09/2016   Procedure: FISTULA REPAIR WITH  MUSCLE ROTATION FLAP;  Surgeon: Izora Gala, MD;  Location: [place];  Service: ENT;  Laterality: N/A;  . TRACHEOESOPHAGEAL FISTULA REPAIR N/A 07/27/2016   Procedure: CLOSURE OF FISTULA;  Surgeon: Izora Gala, MD;  Location: [place];  Service: ENT;  Laterality: N/A;  . TYMPANOPLASTY  1967   "? side"   Social History:  reports that he has never smoked. He has never used smokeless tobacco. He reports that he drinks alcohol. He reports that he does not use drugs.  Allergies  Allergen Reactions  . Citalopram Nausea Only  . Trazodone And Nefazodone Other (See Comments)    Dry mouth  . Zoloft [Sertraline Hcl] Other (See Comments)    dizzy    Family History  Problem Relation Age of Onset  . Cancer Mother     lymphoma  . Cancer Father     pancreatic  . Heart attack Neg Hx   . Stroke Neg Hx      Prior to Admission medications   Medication Sig Start Date End Date Taking? Authorizing Provider  amLODipine (NORVASC) 2.5 MG tablet Take 1 tablet (2.5 mg total) by mouth daily.  09/21/16  Yes Tiffany L Reed, DO  atorvastatin (LIPITOR) 40 MG tablet OVERDUE for cholesterol labs, 1 tablet by mouth daily at 6 pm for cholesterol Patient taking differently: Take 40 mg by mouth daily at 6 PM.  09/07/16  Yes Tiffany L Reed, DO  Cholecalciferol (VITAMIN D) 2000 units CAPS Give 2,000 Units by tube daily.    Yes Historical Provider, MD  zolpidem (AMBIEN) 5 MG tablet Take 2.5-5 mg by mouth at bedtime as needed for sleep.   Yes Historical Provider, MD  zolpidem (AMBIEN) 5 MG tablet PLACE 1 TABLET (5MG  TOTAL) INTO FEEDING TUBE AT BEDTIME IF NEEDED FOR SLEEP Patient not taking: Reported on 12/06/2016 11/26/16   Lauree Chandler, NP  Physical Exam: Vitals:   12/06/16 1030 12/06/16 1100 12/06/16 1130 12/06/16 1200  BP: 123/72 (!) 142/65 130/89 132/69  Pulse: 66 65 75 68  Resp: (!) 29 15 (!) 28 (!) 28  SpO2: 97% 97% 97% 95%    Wt Readings from Last 3 Encounters:  09/21/16 83 kg (183 lb)  07/30/16 82.6 kg (182 lb)  06/19/16 80.5 kg (177 lb 6.4 oz)    General:  Appears calm and comfortable, A&Ox3, limited verbal ability Eyes:  PERRL, EOMI, normal lids, iris ENT:  grossly normal hearing, lips & tongue, laryngectomy stoma present Neck:  no LAD, masses or thyromegaly Cardiovascular:  RRR, no m/r/g. No LE edema.  Respiratory:  Rhonchi, shallow breaths, tachypnea in low 30s Abdomen:  soft, ntnd Skin:  no rash or induration seen on limited exam Musculoskeletal:  grossly normal tone BUE/BLE Psychiatric:  grossly normal mood and affect Neurologic:  CN 2-12 grossly intact, moves all extremities in coordinated fashion.          Labs on Admission:  Basic Metabolic Panel:  Recent Labs Lab 12/06/16 1048  NA 138  K 3.8  CL 109  CO2 21*  GLUCOSE 88  BUN 22*  CREATININE 0.88  CALCIUM 7.8*   Liver Function Tests:  Recent Labs Lab 12/06/16 1048  AST 21  ALT 19  ALKPHOS 64  BILITOT 1.3*  PROT 5.9*  ALBUMIN 2.6*   No results for input(s): LIPASE, AMYLASE in the last  168 hours. No results for input(s): AMMONIA in the last 168 hours. CBC:  Recent Labs Lab 12/06/16 0910  WBC 9.8  NEUTROABS 7.5  HGB 13.9  HCT 41.7  MCV 97.4  PLT 156   Cardiac Enzymes: No results for input(s): CKTOTAL, CKMB, CKMBINDEX, TROPONINI in the last 168 hours.  BNP (last 3 results)  Recent Labs  12/06/16 0910  BNP 286.9*    ProBNP (last 3 results) No results for input(s): PROBNP in the last 8760 hours.   Creatinine clearance cannot be calculated (Unknown ideal weight.)  CBG: No results for input(s): GLUCAP in the last 168 hours.  Radiological Exams on Admission: Dg Chest 2 View  Result Date: 12/06/2016 CLINICAL DATA:  Worsening cough and weakness for 1 week. EXAM: CHEST  2 VIEW COMPARISON:  02/21/2016 FINDINGS: Stable cardiomegaly. Increased symmetric bilateral lower lung airspace disease, suspicious for pulmonary edema or less likely pneumonia. Minimal pleural fluid seen tracking within the fissures, without free-flowing effusion. IMPRESSION: Interval worsening of bibasilar airspace disease, suspicious for pulmonary edema with pneumonia considered less likely. Stable cardiomegaly. Electronically Signed   By: Earle Gell M.D.   On: 12/06/2016 09:32    EKG: pending  Assessment/Plan Principal Problem:   Pulmonary edema Active Problems:   Paroxysmal atrial fibrillation (HCC)   HTN (hypertension)   Pulm Edema - serial trop ordered, initial neg - EKG in AM - prn moprhine CP - prn ntg cp - asa in ED - ECHO ordered for AM - tele bed, cardiac monitoring - zofran prn for nausea Nitro paste for 24 hours Bid lasix   Hypertension When necessary hydralazine 10 mg IV as needed for severe blood pressure Cont norvasc  Hx Of Afib Prn lopressor for HR>120  Hyperlipidemia Continue statin  Hx prolonged QT EKG pending  Code Status: FULL  DVT Prophylaxis: heparin Family Communication: dgtr at bedside Disposition Plan: Pending Improvement  Status:  inpt tele  Elwin Mocha, MD Family Medicine [facility] [email or web address] Password [email or web address]

## 2016-12-07 ENCOUNTER — Inpatient Hospital Stay (HOSPITAL_COMMUNITY): Payer: Medicare Other

## 2016-12-07 DIAGNOSIS — I10 Essential (primary) hypertension: Secondary | ICD-10-CM | POA: Diagnosis not present

## 2016-12-07 DIAGNOSIS — R05 Cough: Secondary | ICD-10-CM

## 2016-12-07 DIAGNOSIS — I480 Paroxysmal atrial fibrillation: Secondary | ICD-10-CM

## 2016-12-07 DIAGNOSIS — J811 Chronic pulmonary edema: Secondary | ICD-10-CM | POA: Diagnosis not present

## 2016-12-07 DIAGNOSIS — R059 Cough, unspecified: Secondary | ICD-10-CM

## 2016-12-07 DIAGNOSIS — J189 Pneumonia, unspecified organism: Secondary | ICD-10-CM | POA: Diagnosis not present

## 2016-12-07 LAB — TROPONIN I: Troponin I: <0.03 ng/mL (ref <0.03)

## 2016-12-07 LAB — URINE CULTURE: CULTURE: NO GROWTH

## 2016-12-07 MED ORDER — IOPAMIDOL (ISOVUE-370) INJECTION 76%
INTRAVENOUS | Status: AC
  Administered 2016-12-07: 100 mL
  Filled 2016-12-07: qty 100

## 2016-12-07 MED ORDER — PIPERACILLIN-TAZOBACTAM 3.375 G IVPB
3.3750 g | Freq: Three times a day (TID) | INTRAVENOUS | Status: DC
  Administered 2016-12-07 – 2016-12-08 (×3): 3.375 g via INTRAVENOUS
  Filled 2016-12-07 (×4): qty 50

## 2016-12-07 MED ORDER — ZOLPIDEM TARTRATE 5 MG PO TABS
2.5000 mg | ORAL_TABLET | Freq: Every evening | ORAL | Status: DC | PRN
  Administered 2016-12-07: 2.5 mg via ORAL
  Filled 2016-12-07: qty 1

## 2016-12-07 NOTE — Care Management CC44 (Signed)
Condition Code 44 Documentation Completed  Patient Details  Name: Timothy Suarez MRN: [id number] Date of Birth: [birth year]   Condition Code 44 given:  Yes Patient signature on Condition Code 44 notice:  Yes Documentation of 2 MD's agreement:  Yes Code 44 added to claim:  Yes    Dawayne Patricia, RN 12/07/2016, 3:39 PM

## 2016-12-07 NOTE — Care Management Obs Status (Signed)
[place] NOTIFICATION   Patient Details  Name: DATRON BRAKEBILL MRN: [id number] Date of Birth: June 12, 1925   Medicare Observation Status Notification Given:  Yes    Dawayne Patricia, RN 12/07/2016, 3:39 PM

## 2016-12-07 NOTE — Progress Notes (Addendum)
[facility] PROGRESS NOTE  Timothy Suarez SWF:[id number] DOB: February 17, 1925 DOA: 12/06/2016   PCP: Hollace Kinnier, DO     Assessment/Plan: Principal Problem:   Pulmonary edema Active Problems:   Paroxysmal atrial fibrillation (HCC)   HTN (hypertension)   83 y.o. male  significant for history of atrial fibrillation, history of colon cancer, a blood pressure, heart hearing, focal cord cancer, status post salvage laryngectomy who presents with shortness of breath and weakness. Patient over the last less than 5 days began to progressively weak.ED Course: Bolus of fluid. Followed by lasix. CXR showed pulm edema. Hospitalist consulted for admit  Assessment and plan  Shortness of breath-,   given history of multiple malignancies, recent surgery one week ago,   CT chest to rule out PE,was negative for PE , but showed multifocal likely aspiration PNA, Troponin negative 3 2-D echo   - tele bed, cardiac monitoring - zofran prn for nausea  Received IV Lasix yesterday, hold pending CT results   Hypertension When necessary hydralazine 10 mg IV as needed for severe blood pressure Cont norvasc  Hx Of Afib Prn lopressor for HR>120  Hyperlipidemia Continue statin  Hx prolonged QT Telemetry stable    DVT prophylaxsis heparin  Code Status:  Full code   Family Communication: Discussed in detail with the patient, all imaging results, lab results explained to the patient   Disposition Plan:Anticipate discharge  in 1-2 days    Consultants:  None  Procedures:  None  Antibiotics: Anti-infectives    None         HPI/Subjective: "My food goes down the wrong way"  Objective: Vitals:   12/06/16 1413 12/06/16 2022 12/07/16 0528 12/07/16 1149  BP: (!) 121/58 (!) 113/56 (!) 109/57   Pulse: 74 81 70 75  Resp: (!) 24 20 20    Temp: 98.2 F (36.8 C) 99.3 F (37.4 C) 97.6 F (36.4 C)   TempSrc: Oral Oral Oral   SpO2: 100% 96% 93%   Weight: 80.8 kg (178 lb  3.2 oz)       Intake/Output Summary (Last 24 hours) at 12/07/16 1246 Last data filed at 12/07/16 2202  Gross per 24 hour  Intake              360 ml  Output             3825 ml  Net            -3465 ml    Exam:  Examination:  General exam: Appears calm and comfortable  Respiratory system: Clear to auscultation. Respiratory effort normal. Cardiovascular system: S1 & S2 heard, RRR. No JVD, murmurs, rubs, gallops or clicks. No pedal edema. Gastrointestinal system: Abdomen is nondistended, soft and nontender. No organomegaly or masses felt. Normal bowel sounds heard. Central nervous system: Alert and oriented. No focal neurological deficits. Extremities: Symmetric 5 x 5 power. Skin: No rashes, lesions or ulcers Psychiatry: Judgement and insight appear normal. Mood & affect appropriate.     Data Reviewed: I have personally reviewed following labs and imaging studies  Micro Results Recent Results (from the past 240 hour(s))  Urine culture     Status: None   Collection Time: 12/06/16 10:17 AM  Result Value Ref Range Status   Specimen Description URINE, RANDOM  Final   Special Requests NONE  Final   Culture NO GROWTH  Final   Report Status 12/07/2016 FINAL  Final    Radiology Reports Dg Chest 2 View  Result Date: 12/06/2016 CLINICAL DATA:  Worsening cough and weakness for 1 week. EXAM: CHEST  2 VIEW COMPARISON:  02/21/2016 FINDINGS: Stable cardiomegaly. Increased symmetric bilateral lower lung airspace disease, suspicious for pulmonary edema or less likely pneumonia. Minimal pleural fluid seen tracking within the fissures, without free-flowing effusion. IMPRESSION: Interval worsening of bibasilar airspace disease, suspicious for pulmonary edema with pneumonia considered less likely. Stable cardiomegaly. Electronically Signed   By: Earle Gell M.D.   On: 12/06/2016 09:32   US Aspiration  Result Date: 11/12/2016 INDICATION: Recurrent painful symptomatic left knee Bakers cyst EXAM:  ULTRASOUND LEFT KNEE BAKER'S CYST ASPIRATION AND THERAPEUTIC INJECTION MEDICATIONS: NONE. ANESTHESIA/SEDATION: None. COMPLICATIONS: None immediate. PROCEDURE: Informed written consent was obtained from the patient after a thorough discussion of the procedural risks, benefits and alternatives. All questions were addressed. Maximal Sterile Barrier Technique was utilized including caps, mask, sterile gowns, sterile gloves, sterile drape, hand hygiene and skin antiseptic. A timeout was performed prior to the initiation of the procedure. Preliminary ultrasound performed. The large complex septated left knee Bakers cyst measures nearly 8 cm in length. Overlying skin marked. Under sterile conditions and local anesthesia, a 18 gauge needle was advanced into the Baker cyst. Needle position confirmed with ultrasound. Images obtained for documentation. 15 cc synovial fluid aspirated. Following decompression, 160 mg Depo-Medrol diluted in 3 cc 0.5% bupivacaine (total volume 5 cc) was instilled easily through the needle. Patient tolerated the procedure well. No immediate complication. IMPRESSION: Successful left knee Bakers cyst aspiration and therapeutic injection. Electronically Signed   By: Jerilynn Mages.  Shick M.D.   On: 11/12/2016 12:19   Dg Chest Port 1 View  Result Date: 12/07/2016 CLINICAL DATA:  Shortness of breath.  Pulmonary edema. EXAM: PORTABLE CHEST 1 VIEW COMPARISON:  12/06/2016. FINDINGS: Cardiomegaly. Prior cardiac valve replacement. Bilateral pulmonary infiltrates/ edema again noted without interim change. Tiny bilateral pleural effusions again cannot be excluded. No pneumothorax. IMPRESSION: 1. Bilateral pulmonary infiltrates/edema again noted. Tiny bilateral pleural effusions cannot be excluded. Similar findings noted on prior exam. 2.Cardiomegaly with prior cardiac valve replacement. Electronically Signed   By: Marcello Moores  Register   On: 12/07/2016 07:20     CBC  Recent Labs Lab 12/06/16 0910 12/06/16 1350   WBC 9.8 8.5  HGB 13.9 14.3  HCT 41.7 43.1  PLT 156 159  MCV 97.4 98.2  MCH 32.5 32.6  MCHC 33.3 33.2  RDW 13.3 13.2  LYMPHSABS 1.4  --   MONOABS 0.6  --   EOSABS 0.2  --   BASOSABS 0.0  --     Chemistries   Recent Labs Lab 12/06/16 1048 12/06/16 1350  NA 138  --   K 3.8  --   CL 109  --   CO2 21*  --   GLUCOSE 88  --   BUN 22*  --   CREATININE 0.88 0.87  CALCIUM 7.8*  --   MG 2.1  --   AST 21  --   ALT 19  --   ALKPHOS 64  --   BILITOT 1.3*  --    ------------------------------------------------------------------------------------------------------------------ estimated creatinine clearance is 65.8 mL/min (by C-G formula based on SCr of 0.87 mg/dL). ------------------------------------------------------------------------------------------------------------------ No results for input(s): HGBA1C in the last 72 hours. ------------------------------------------------------------------------------------------------------------------ No results for input(s): CHOL, HDL, LDLCALC, TRIG, CHOLHDL, LDLDIRECT in the last 72 hours. ------------------------------------------------------------------------------------------------------------------ No results for input(s): TSH, T4TOTAL, T3FREE, THYROIDAB in the last 72 hours.  Invalid input(s): FREET3 ------------------------------------------------------------------------------------------------------------------ No results for input(s): VITAMINB12, FOLATE, FERRITIN, TIBC, IRON, RETICCTPCT in  the last 72 hours.  Coagulation profile No results for input(s): INR, PROTIME in the last 168 hours.  No results for input(s): DDIMER in the last 72 hours.  Cardiac Enzymes  Recent Labs Lab 12/06/16 1048 12/06/16 1824 12/07/16 0047  TROPONINI <0.03 <0.03 <0.03   ------------------------------------------------------------------------------------------------------------------ Invalid input(s): POCBNP   CBG: No results for  input(s): GLUCAP in the last 168 hours.     Studies: Dg Chest 2 View  Result Date: 12/06/2016 CLINICAL DATA:  Worsening cough and weakness for 1 week. EXAM: CHEST  2 VIEW COMPARISON:  02/21/2016 FINDINGS: Stable cardiomegaly. Increased symmetric bilateral lower lung airspace disease, suspicious for pulmonary edema or less likely pneumonia. Minimal pleural fluid seen tracking within the fissures, without free-flowing effusion. IMPRESSION: Interval worsening of bibasilar airspace disease, suspicious for pulmonary edema with pneumonia considered less likely. Stable cardiomegaly. Electronically Signed   By: Earle Gell M.D.   On: 12/06/2016 09:32   Dg Chest Port 1 View  Result Date: 12/07/2016 CLINICAL DATA:  Shortness of breath.  Pulmonary edema. EXAM: PORTABLE CHEST 1 VIEW COMPARISON:  12/06/2016. FINDINGS: Cardiomegaly. Prior cardiac valve replacement. Bilateral pulmonary infiltrates/ edema again noted without interim change. Tiny bilateral pleural effusions again cannot be excluded. No pneumothorax. IMPRESSION: 1. Bilateral pulmonary infiltrates/edema again noted. Tiny bilateral pleural effusions cannot be excluded. Similar findings noted on prior exam. 2.Cardiomegaly with prior cardiac valve replacement. Electronically Signed   By: Marcello Moores  Register   On: 12/07/2016 07:20      Lab Results  Component Value Date   HGBA1C  26-Aug-202010    5.3 (NOTE) The ADA recommends the following therapeutic goal for glycemic control related to Hgb A1c measurement: Goal of therapy: <6.5 Hgb A1c  Reference: American Diabetes Association: Clinical Practice Recommendations 2010, Diabetes Care, 2010, 33: (Suppl  1).   Lab Results  Component Value Date   LDLCALC 68 08/26/2015   CREATININE 0.87 12/06/2016       Scheduled Meds: . amLODipine  2.5 mg Oral Daily  . atorvastatin  40 mg Oral q1800  . furosemide  40 mg Intravenous BID  . heparin  5,000 Units Subcutaneous Q8H   Continuous Infusions:   LOS: 1  day    Time spent: >30 MINS    Reyne Dumas  [facility] Pager [phone]. If 7PM-7AM, please contact night-coverage at [email or web address], password [facility] 12/07/2016, 12:46 PM  LOS: 1 day

## 2016-12-07 NOTE — Evaluation (Signed)
Physical Therapy Evaluation Patient Details Name: Timothy Suarez MRN: [id number] DOB: August 26, 1924 Today's Date: 12/07/2016   History of Present Illness  83 yo admitted after vocal box removed with pulmonary edema. PMhx: vocal cord CA, laryngectomy, colon CA, Afib, HOH, CAD, glaucoma, R TKA, AFib  Clinical Impression  Pt pleasant and willing to mobilize. Pt communicating through gestures, mouthing words and use of his white board. Pt with decreased strength, transfers, gait and activity tolerance without dizziness or SOB throughout session. Pt states he lives in an ILF but that family available to help. Pt currently needing assist with transfers and if family can provide intermittent supervision and assist for mobility along with HHPT then pt could return to home environment, if not ST-SNF will be needed. Pt incontinent of BM on arrival with assist for pericare. Pt will benefit from acute therapy to maximize mobility, function, gait and transfers to return to PLOF.    Follow Up Recommendations Home health PT;Supervision for mobility/OOB    Equipment Recommendations  None recommended by PT    Recommendations for Other Services       Precautions / Restrictions Precautions Precautions: Fall      Mobility  Bed Mobility Overal bed mobility: Needs Assistance Bed Mobility: Supine to Sit     Supine to sit: Min assist;HOB elevated     General bed mobility comments: HOB 30 degrees with min assist HHA to fully elevate trunk  Transfers Overall transfer level: Modified independent                  Ambulation/Gait Ambulation/Gait assistance: Min guard Ambulation Distance (Feet): 100 Feet Assistive device: Rolling walker (2 wheeled) Gait Pattern/deviations: Step-through pattern;Decreased stride length;Trunk flexed   Gait velocity interpretation: Below normal speed for age/gender General Gait Details: cues for posture, position in RW and safety. Pt fatigued and unable to  ambulate further  Stairs            Wheelchair Mobility    Modified Rankin (Stroke Patients Only)       Balance Overall balance assessment: Needs assistance   Sitting balance-Leahy Scale: Good       Standing balance-Leahy Scale: Poor                               Pertinent Vitals/Pain Pain Assessment: No/denies pain    Home Living Family/patient expects to be discharged to:: Private residence Living Arrangements: Alone Available Help at Discharge: Available PRN/intermittently;Family Type of Home: Independent living facility Home Access: Level entry     Home Layout: One level Home Equipment: Walker - 2 wheels Additional Comments: pt resides at [facility] independent living . Pt nodding that he has assist for cooking and cleaning    Prior Function Level of Independence: Independent with assistive device(s)         Comments: Pt ambulates with use of RW     Hand Dominance        Extremity/Trunk Assessment   Upper Extremity Assessment Upper Extremity Assessment: Generalized weakness    Lower Extremity Assessment Lower Extremity Assessment: Generalized weakness    Cervical / Trunk Assessment Cervical / Trunk Assessment: Kyphotic  Communication   Communication: HOH;Other (comment) (laryngectomy)  Cognition Arousal/Alertness: Awake/alert Behavior During Therapy: WFL for tasks assessed/performed Overall Cognitive Status: Impaired/Different from baseline Area of Impairment: Memory                     Memory: Decreased  short-term memory         General Comments: pt at times losing his train of thought within answering a question      General Comments      Exercises     Assessment/Plan    PT Assessment Patient needs continued PT services  PT Problem List Decreased mobility;Decreased safety awareness;Decreased activity tolerance;Decreased balance;Decreased knowledge of use of DME;Decreased cognition       PT  Treatment Interventions Gait training;Therapeutic exercise;Patient/family education;Functional mobility training;DME instruction;Therapeutic activities;Cognitive remediation;Balance training    PT Goals (Current goals can be found in the Care Plan section)  Acute Rehab PT Goals Patient Stated Goal: return home PT Goal Formulation: With patient Time For Goal Achievement: 12/21/16 Potential to Achieve Goals: Good    Frequency Min 3X/week   Barriers to discharge Decreased caregiver support      Co-evaluation               AM-PAC PT "6 Clicks" Daily Activity  Outcome Measure Difficulty turning over in bed (including adjusting bedclothes, sheets and blankets)?: A Little Difficulty moving from lying on back to sitting on the side of the bed? : Total Difficulty sitting down on and standing up from a chair with arms (e.g., wheelchair, bedside commode, etc,.)?: A Little Help needed moving to and from a bed to chair (including a wheelchair)?: None Help needed walking in hospital room?: A Little Help needed climbing 3-5 steps with a railing? : A Little 6 Click Score: 17    End of Session Equipment Utilized During Treatment: Gait belt Activity Tolerance: Patient tolerated treatment well Patient left: in chair;with call bell/phone within reach;with nursing/sitter in room;with chair alarm set Nurse Communication: Mobility status PT Visit Diagnosis: Unsteadiness on feet (R26.81);Difficulty in walking, not elsewhere classified (R26.2)    Time: 0240-9735 PT Time Calculation (min) (ACUTE ONLY): 25 min   Charges:   PT Evaluation $PT Eval Moderate Complexity: 1 Procedure PT Treatments $Gait Training: 8-22 mins   PT G Codes:        Elwyn Reach, PT [phone]  Moore B Boni Maclellan 12/07/2016, 11:55 AM

## 2016-12-07 NOTE — Progress Notes (Signed)
Pharmacy Antibiotic Note  WASEEM SUESS is a 83 y.o. male admitted on 12/06/2016 with SOB d/t possible aspiration PNA.  Pharmacy has been consulted for zosyn dosing. crcl ~ 65 ml/min.  Plan: - zosyn 3.375g IV Q 8 hrs (4 hr infusion)   Weight: 178 lb 3.2 oz (80.8 kg)  Temp (24hrs), Avg:98.3 F (36.8 C), Min:97.6 F (36.4 C), Max:99.3 F (37.4 C)   Recent Labs Lab 12/06/16 0910 12/06/16 1048 12/06/16 1350  WBC 9.8  --  8.5  CREATININE  --  0.88 0.87    Estimated Creatinine Clearance: 65.8 mL/min (by C-G formula based on SCr of 0.87 mg/dL).    Allergies  Allergen Reactions  . Citalopram Nausea Only  . Trazodone And Nefazodone Other (See Comments)    Dry mouth  . Zoloft [Sertraline Hcl] Other (See Comments)    dizzy    Antimicrobials this admission: Zosyn 4/30 >>    Dose adjustments this admission:   Microbiology results:  4/29 UCx: neg   Thank you for allowing pharmacy to be a part of this patient's care.  Manley Mason 12/07/2016 2:54 PM

## 2016-12-08 ENCOUNTER — Observation Stay (HOSPITAL_BASED_OUTPATIENT_CLINIC_OR_DEPARTMENT_OTHER): Payer: Medicare Other

## 2016-12-08 ENCOUNTER — Other Ambulatory Visit (HOSPITAL_COMMUNITY): Payer: Medicare Other

## 2016-12-08 DIAGNOSIS — I10 Essential (primary) hypertension: Secondary | ICD-10-CM | POA: Diagnosis not present

## 2016-12-08 DIAGNOSIS — J811 Chronic pulmonary edema: Secondary | ICD-10-CM

## 2016-12-08 DIAGNOSIS — I509 Heart failure, unspecified: Secondary | ICD-10-CM

## 2016-12-08 LAB — COMPREHENSIVE METABOLIC PANEL
ALBUMIN: 2.4 g/dL — AB (ref 3.5–5.0)
ALT: 18 U/L (ref 17–63)
AST: 17 U/L (ref 15–41)
Alkaline Phosphatase: 68 U/L (ref 38–126)
Anion gap: 7 (ref 5–15)
BUN: 22 mg/dL — AB (ref 6–20)
CHLORIDE: 106 mmol/L (ref 101–111)
CO2: 23 mmol/L (ref 22–32)
CREATININE: 1.04 mg/dL (ref 0.61–1.24)
Calcium: 7.7 mg/dL — ABNORMAL LOW (ref 8.9–10.3)
GFR calc non Af Amer: >60 mL/min (ref >60)
GLUCOSE: 112 mg/dL — AB (ref 65–99)
Potassium: 3.2 mmol/L — ABNORMAL LOW (ref 3.5–5.1)
SODIUM: 136 mmol/L (ref 135–145)
Total Bilirubin: 0.8 mg/dL (ref 0.3–1.2)
Total Protein: 5.9 g/dL — ABNORMAL LOW (ref 6.5–8.1)

## 2016-12-08 LAB — CBC
HCT: 40 % (ref 39.0–52.0)
Hemoglobin: 13.3 g/dL (ref 13.0–17.0)
MCH: 31.6 pg (ref 26.0–34.0)
MCHC: 33.3 g/dL (ref 30.0–36.0)
MCV: 95 fL (ref 78.0–100.0)
PLATELETS: 187 10*3/uL (ref 150–400)
RBC: 4.21 MIL/uL — AB (ref 4.22–5.81)
RDW: 13 % (ref 11.5–15.5)
WBC: 8.9 10*3/uL (ref 4.0–10.5)

## 2016-12-08 LAB — ECHOCARDIOGRAM COMPLETE
Height: 71 in
Weight: 2851.2 oz

## 2016-12-08 MED ORDER — CEFPODOXIME PROXETIL 200 MG PO TABS
200.0000 mg | ORAL_TABLET | Freq: Two times a day (BID) | ORAL | Status: DC
  Administered 2016-12-08: 200 mg via ORAL
  Filled 2016-12-08 (×2): qty 1

## 2016-12-08 MED ORDER — CEFPODOXIME PROXETIL 200 MG PO TABS: 200.0000 mg | ORAL_TABLET | Freq: Two times a day (BID) | ORAL | 0 refills | Status: AC

## 2016-12-08 NOTE — Progress Notes (Signed)
Discussed with the patient and all questioned fully answered. He will call me if any problems arise.  IV removed this am. Telemetry removed, CCMD notified. Discharge education discussed with patient and his daughter.   Fritz Pickerel, RN

## 2016-12-08 NOTE — Evaluation (Signed)
Clinical/Bedside Swallow Evaluation Patient Details  Name: Timothy Suarez MRN: [id number] Date of Birth: March 22, 1925  Today's Date: 12/08/2016 Time: SLP Start Time (ACUTE ONLY): 1120 SLP Stop Time (ACUTE ONLY): 1150 SLP Time Calculation (min) (ACUTE ONLY): 30 min  Past Medical History:  Past Medical History:  Diagnosis Date  . A-fib ([place])   . Aortic valve insufficiency, acquired   . Arthritis   . BPH (benign prostatic hyperplasia)   . Cerebral ischemia   . Colon cancer [facility])     s/p partial colectomy  . Complication of anesthesia    "woke up w/confusion and hallucinations once after mitral valve OR"  . Coronary artery disease    50-70% LAD mitral valve repair 4+ yrs ago  . Depression   . Dyslipidemia   . Glaucoma   . Heart murmur   . Hernia   . High cholesterol   . History of echocardiogram    Echo 5/16:  Mild LVH, EF 50-55%, Gr 1 DD, septal HK, Ao sclerosis without stenosis, mild AI, MV repair ok with borderline mild MS (mean 4 mmHg), mild MR, mild LAE, mod RAE, mild TR, PASP 30 mmHg  . HTN (hypertension)    takes Amlodipine daily  . Hx of cardiovascular stress test    Lexiscan Myoview 5/16:  Apical thinning, EF not gated, no ischemia. Low Risk  . Hyperlipidemia   . Major depression   . Mild cognitive impairment   . MVP (mitral valve prolapse)    S/P Rt mini thoractomy for Mitral Valve repair  . Nocturia   . Paroxysmal atrial fibrillation (HCC)    S/P Maze procedure  . 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 Prostate cancer ([place])   . 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 Shortness of breath dyspnea    with exertion  . Sleep disturbance   . Thoracic aortic aneurysm ([place])   . Urinary frequency   . Urinary urgency    Past Surgical History:  Past Surgical History:  Procedure Laterality Date  . CARDIAC CATHETERIZATION    . CATARACT EXTRACTION W/ INTRAOCULAR LENS  IMPLANT, BILATERAL Bilateral    . COLECTOMY  2004   Dr [name]  . DIRECT LARYNGOSCOPY N/A 01/15/2016   Procedure: DIRECT LARYNGOSCOPY WITH BIOPSY AND FROZEN SECTION;  Surgeon: Izora Gala, MD;  Location: [place];  Service: ENT;  Laterality: N/A;  . FlexHD patch repair of chest wall hernia.  01/28/2011   [name]  . GASTROSTOMY W/ FEEDING TUBE    . IR GENERIC HISTORICAL  10/08/2016   IR GASTROSTOMY TUBE REMOVAL 10/08/2016 Sandi Mariscal, MD [facility]-INTERV RAD  . JOINT REPLACEMENT    . LARYNGETOMY N/A 01/15/2016   Procedure:  TOTAL LARYNGECTOMY;  Surgeon: Izora Gala, MD;  Location: [facility] OR;  Service: ENT;  Laterality: N/A;  . MAZE  12/27/2008   left side lesion set  . MICROLARYNGOSCOPY Right 01/29/2015   Procedure: MICROLARYNGOSCOPY WITH BIOSPY OF RIGHT VOCAL CORD;  Surgeon: Izora Gala, MD;  Location: [place];  Service: ENT;  Laterality: Right;  . MITRAL VALVE REPAIR  12/27/2008   complex valvuloplasty with 20mm Memo 3D annuloplasty ring via right minithoracotomy  . PECTORALIS FLAP Left 05/04/2016   Procedure: PECTORALIS FLAP to neck with possible, split thickness skin graft;  Surgeon: Loel Lofty Dillingham, DO;  Location: [place];  Service: Plastics;  Laterality: Left;  . PROSTATE BIOPSY    . SKIN SPLIT GRAFT Left 05/04/2016  Procedure: PECTORALIS MAJOR MYOCUTANEOUS FLAP RECONSTRUCTION OF PHARYNX AND SPLIT THICKNESS SKIN GRAFT;  Surgeon: Izora Gala, MD;  Location: [place];  Service: ENT;  Laterality: Left;  . TEE WITHOUT CARDIOVERSION N/A 01/03/2013   Procedure: TRANSESOPHAGEAL ECHOCARDIOGRAM (TEE);  Surgeon: Sueanne Margarita, MD;  Location: [place];  Service: Cardiovascular;  Laterality: N/A;  . TOTAL KNEE ARTHROPLASTY Right 2003  . TRACHEAL ESOPHAGEAL PUNCTURE REPAIR N/A 03/26/2016   Procedure: TRACHEAL ESOPHAGEAL PUNCTURE;  Surgeon: Izora Gala, MD;  Location: [place];  Service: ENT;  Laterality: N/A;  . TRACHEOESOPHAGEAL FISTULA REPAIR N/A 03/26/2016   Procedure: TRACHEO-ESOPHAGEAL   PUNCTURE,FISTULAR CLOSURE;  Surgeon: Izora Gala, MD;  Location:  [place];  Service: ENT;  Laterality: N/A;  . TRACHEOESOPHAGEAL FISTULA REPAIR N/A 04/09/2016   Procedure: FISTULA REPAIR WITH  MUSCLE ROTATION FLAP;  Surgeon: Izora Gala, MD;  Location: [place];  Service: ENT;  Laterality: N/A;  . TRACHEOESOPHAGEAL FISTULA REPAIR N/A 07/27/2016   Procedure: CLOSURE OF FISTULA;  Surgeon: Izora Gala, MD;  Location: [facility] OR;  Service: ENT;  Laterality: N/A;  . TYMPANOPLASTY  1967   "? side"   HPI:  83 y.o.malewith history total laryngectomy June 2017, TEP Aug 2017, pharyngocutaneous fistula repair, afib, colon cancer,  presented to ED with shortness of breath and weakness. CXR with pulm edema.  Dx multifocal pna, HTN. SLP services asked to evaluate swallowing.    Assessment / Plan / Recommendation Clinical Impression  Pt has been complaining to staff that "food is going down the wrong way." Spent time with pt sharing images of laryngectomy, and explaining why it is no longer anatomically possible to aspirate given the separation of upper airway from pharynx/oral cavity (excluding posterior fistula).  Pt may have issues with decreased transit, especially of solid foods, through esophagus, causing globus, but he is no longer at risk for aspiration pna.  We discussed his current situation - he states he continues to be followed by Dr. Constance Holster, ENT, and has been followed by a physician in [facility] for TEP management.  Pt using whiteboard to write messages.  Recommend continuing his current diet - no SLP f/u for swallowing needed; he will benefit from ongoing SLP services as an OP (or at ALF) to assist with communication.  D/W RN.  SLP Visit Diagnosis: Dysphagia, unspecified (R13.10)    Aspiration Risk  No limitations    Diet Recommendation     Medication Administration: Whole meds with liquid    Other  Recommendations Oral Care Recommendations: Oral care BID   Follow up Recommendations  (continue SLP services)      Frequency and Duration            Prognosis         Swallow Study   General Date of Onset: 12/06/16 HPI: 83 y.o.malewith history total laryngectomy June 2017, TEP Aug 2017, pharyngocutaneous fistula repair, afib, colon cancer,  presented to ED with shortness of breath and weakness. CXR with pulm edema.  Dx multifocal pna, HTN. SLP services asked to evaluate swallowing.  Type of Study: Bedside Swallow Evaluation Diet Prior to this Study: Regular;Thin liquids Temperature Spikes Noted: No Respiratory Status: Room air (stoma breather) History of Recent Intubation: No Behavior/Cognition: Alert;Cooperative Oral Cavity Assessment: Within Functional Limits Oral Care Completed by SLP: No Oral Cavity - Dentition: Dentures, top Vision: Functional for self-feeding Self-Feeding Abilities: Able to feed self Patient Positioning: Upright in chair Baseline Vocal Quality: Aphonic (due to laryngectomy) Volitional Swallow: Able to elicit  Oral/Motor/Sensory Function Overall Oral Motor/Sensory Function: Within functional limits   Ice Chips Ice chips: Within functional limits   Thin Liquid Thin Liquid: Within functional limits    Nectar Thick Nectar Thick Liquid: Not tested   Honey Thick Honey Thick Liquid: Not tested   Puree Puree: Not tested   Solid   GO   Solid: Not tested        Juan Quam Laurice 12/08/2016,12:01 PM

## 2016-12-08 NOTE — Progress Notes (Signed)
IV removed r/t leaking. Per MD hopeful discharge today. MD okay with no IV access until determine if discharge today. Pt updated as to plan of care.  Fritz Pickerel, RN

## 2016-12-08 NOTE — Progress Notes (Signed)
OT Cancellation Note  Patient Details Name: Timothy Suarez MRN: [id number] DOB: 04/23/1925   Cancelled Treatment:    Reason Eval/Treat Not Completed: Other (comment). Case manager has set pt up with HHOT/PT, which appears appropriate. Given observation status, Will defer OT to [facility] If eval needed, please page number below.   [place], OT/L  [phone] 12/08/2016 12/08/2016, 1:38 PM

## 2016-12-08 NOTE — Discharge Summary (Signed)
Physician Discharge Summary  Timothy Suarez MRN: [id number] DOB/AGE: 04/26/1925 83 y.o.  PCP: Hollace Kinnier, DO   Admit date: 12/06/2016 Discharge date: 12/08/2016  Discharge Diagnoses:    Principal Problem:   Pulmonary edema Active Problems:   Paroxysmal atrial fibrillation (HCC)   HTN (hypertension)   Cough    Follow-up recommendations Follow-up with PCP in 3-5 days , including all  additional recommended appointments as below Follow-up CBC, CMP in 3-5 days May benefit from outpatient echo  Discussed with daughter prior to discharge       Current Discharge Medication List    START taking these medications   Details  cefpodoxime (VANTIN) 200 MG tablet Take 1 tablet (200 mg total) by mouth every 12 (twelve) hours. Qty: 10 tablet, Refills: 0      CONTINUE these medications which have NOT CHANGED   Details  atorvastatin (LIPITOR) 40 MG tablet OVERDUE for cholesterol labs, 1 tablet by mouth daily at 6 pm for cholesterol Qty: 30 tablet, Refills: 0    Cholecalciferol (VITAMIN D) 2000 units CAPS Give 2,000 Units by tube daily.     zolpidem (AMBIEN) 5 MG tablet Take 2.5-5 mg by mouth at bedtime as needed for sleep.      STOP taking these medications     amLODipine (NORVASC) 2.5 MG tablet          Discharge Condition: stable   Discharge Instructions Get Medicines reviewed and adjusted: Please take all your medications with you for your next visit with your Primary MD  Please request your Primary MD to go over all hospital tests and procedure/radiological results at the follow up, please ask your Primary MD to get all Hospital records sent to his/her office.  If you experience worsening of your admission symptoms, develop shortness of breath, life threatening emergency, suicidal or homicidal thoughts you must seek medical attention immediately by calling 911 or calling your MD immediately if symptoms less severe.  You must read complete  instructions/literature along with all the possible adverse reactions/side effects for all the Medicines you take and that have been prescribed to you. Take any new Medicines after you have completely understood and accpet all the possible adverse reactions/side effects.   Do not drive when taking Pain medications.   Do not take more than prescribed Pain, Sleep and Anxiety Medications  Special Instructions: If you have smoked or chewed Tobacco in the last 2 yrs please stop smoking, stop any regular Alcohol and or any Recreational drug use.  Wear Seat belts while driving.  Please note  You were cared for by a hospitalist during your hospital stay. Once you are discharged, your primary care physician will handle any further medical issues. Please note that NO REFILLS for any discharge medications will be authorized once you are discharged, as it is imperative that you return to your primary care physician (or establish a relationship with a primary care physician if you do not have one) for your aftercare needs so that they can reassess your need for medications and monitor your lab values.     Allergies  Allergen Reactions  . Citalopram Nausea Only  . Trazodone And Nefazodone Other (See Comments)    Dry mouth  . Zoloft [Sertraline Hcl] Other (See Comments)    dizzy      Disposition: 01-Home or Self Care   Consults:  None    Significant Diagnostic Studies:  Dg Chest 2 View  Result Date: 12/06/2016 CLINICAL DATA:  Worsening cough and weakness  for 1 week. EXAM: CHEST  2 VIEW COMPARISON:  02/21/2016 FINDINGS: Stable cardiomegaly. Increased symmetric bilateral lower lung airspace disease, suspicious for pulmonary edema or less likely pneumonia. Minimal pleural fluid seen tracking within the fissures, without free-flowing effusion. IMPRESSION: Interval worsening of bibasilar airspace disease, suspicious for pulmonary edema with pneumonia considered less likely. Stable cardiomegaly.  Electronically Signed   By: Earle Gell M.D.   On: 12/06/2016 09:32   Ct Angio Chest Pe W Or Wo Contrast  Result Date: 12/07/2016 CLINICAL DATA:  Shortness of breath, evaluate for PE EXAM: CT ANGIOGRAPHY CHEST WITH CONTRAST TECHNIQUE: Multidetector CT imaging of the chest was performed using the standard protocol during bolus administration of intravenous contrast. Multiplanar CT image reconstructions and MIPs were obtained to evaluate the vascular anatomy. CONTRAST:  100 mL Isovue 370 IV COMPARISON:  Chest radiographs dated 12/07/2016 FINDINGS: Cardiovascular: Satisfactory opacification of the bilateral pulmonary arteries to the lobar level. Evaluation of the bilateral lower lobe pulmonary arteries is mildly constrained by respiratory motion. No evidence of pulmonary embolism. Cardiomegaly.  No pericardial effusion.  Prosthetic mitral valve. Coronary atherosclerosis of the LAD. No evidence of thoracic aortic aneurysm. Atherosclerotic calcifications of the aortic arch. Mediastinum/Nodes: Small mediastinal lymph nodes which do not meet pathologic CT size criteria. Lungs/Pleura: Mild patchy opacities in the lingula, right middle lobe, and bilateral lower lobes (lower lobe predominant). This appearance favors multifocal pneumonia over interstitial edema. Trace bilateral pleural effusions. No suspicious pulmonary nodules. No pneumothorax. Upper Abdomen: Visualized upper abdomen is grossly unremarkable. Musculoskeletal: Visualized osseous structures are within normal limits. Review of the MIP images confirms the above findings. IMPRESSION: No evidence of pulmonary embolism. Multifocal patchy opacities, lower lobe predominant, suspicious for multifocal pneumonia. Trace bilateral pleural effusions. Electronically Signed   By: Julian Hy M.D.   On: 12/07/2016 14:36   US Aspiration  Result Date: 11/12/2016 INDICATION: Recurrent painful symptomatic left knee Bakers cyst EXAM: ULTRASOUND LEFT KNEE BAKER'S CYST  ASPIRATION AND THERAPEUTIC INJECTION MEDICATIONS: NONE. ANESTHESIA/SEDATION: None. COMPLICATIONS: None immediate. PROCEDURE: Informed written consent was obtained from the patient after a thorough discussion of the procedural risks, benefits and alternatives. All questions were addressed. Maximal Sterile Barrier Technique was utilized including caps, mask, sterile gowns, sterile gloves, sterile drape, hand hygiene and skin antiseptic. A timeout was performed prior to the initiation of the procedure. Preliminary ultrasound performed. The large complex septated left knee Bakers cyst measures nearly 8 cm in length. Overlying skin marked. Under sterile conditions and local anesthesia, a 18 gauge needle was advanced into the Baker cyst. Needle position confirmed with ultrasound. Images obtained for documentation. 15 cc synovial fluid aspirated. Following decompression, 160 mg Depo-Medrol diluted in 3 cc 0.5% bupivacaine (total volume 5 cc) was instilled easily through the needle. Patient tolerated the procedure well. No immediate complication. IMPRESSION: Successful left knee Bakers cyst aspiration and therapeutic injection. Electronically Signed   By: Jerilynn Mages.  Shick M.D.   On: 11/12/2016 12:19   Dg Chest Port 1 View  Result Date: 12/07/2016 CLINICAL DATA:  Shortness of breath.  Pulmonary edema. EXAM: PORTABLE CHEST 1 VIEW COMPARISON:  12/06/2016. FINDINGS: Cardiomegaly. Prior cardiac valve replacement. Bilateral pulmonary infiltrates/ edema again noted without interim change. Tiny bilateral pleural effusions again cannot be excluded. No pneumothorax. IMPRESSION: 1. Bilateral pulmonary infiltrates/edema again noted. Tiny bilateral pleural effusions cannot be excluded. Similar findings noted on prior exam. 2.Cardiomegaly with prior cardiac valve replacement. Electronically Signed   By: Marcello Moores  Register   On: 12/07/2016 07:20    echocardiogram  Filed Weights   12/06/16 1413  Weight: 80.8 kg (178 lb 3.2 oz)      Microbiology: Recent Results (from the past 240 hour(s))  Urine culture     Status: None   Collection Time: 12/06/16 10:17 AM  Result Value Ref Range Status   Specimen Description URINE, RANDOM  Final   Special Requests NONE  Final   Culture NO GROWTH  Final   Report Status 12/07/2016 FINAL  Final       Blood Culture    Component Value Date/Time   SDES URINE, RANDOM 12/06/2016 1017   SPECREQUEST NONE 12/06/2016 1017   CULT NO GROWTH 12/06/2016 1017   REPTSTATUS 12/07/2016 FINAL 12/06/2016 1017      Labs: Results for orders placed or performed during the hospital encounter of 12/06/16 (from the past 48 hour(s))  CBC     Status: None   Collection Time: 12/06/16  1:50 PM  Result Value Ref Range   WBC 8.5 4.0 - 10.5 K/uL   RBC 4.39 4.22 - 5.81 MIL/uL   Hemoglobin 14.3 13.0 - 17.0 g/dL   HCT 43.1 39.0 - 52.0 %   MCV 98.2 78.0 - 100.0 fL   MCH 32.6 26.0 - 34.0 pg   MCHC 33.2 30.0 - 36.0 g/dL   RDW 13.2 11.5 - 15.5 %   Platelets 159 150 - 400 K/uL  Creatinine, serum     Status: None   Collection Time: 12/06/16  1:50 PM  Result Value Ref Range   Creatinine, Ser 0.87 0.61 - 1.24 mg/dL   GFR calc non Af Amer >60 >60 mL/min   GFR calc Af Amer >60 >60 mL/min    Comment: (NOTE) The eGFR has been calculated using the CKD EPI equation. This calculation has not been validated in all clinical situations. eGFR's persistently <60 mL/min signify possible Chronic Kidney Disease.   Troponin I (q 6hr x 3)     Status: None   Collection Time: 12/06/16  6:24 PM  Result Value Ref Range   Troponin I <0.03 <0.03 ng/mL  Troponin I (q 6hr x 3)     Status: None   Collection Time: 12/07/16 12:47 AM  Result Value Ref Range   Troponin I <0.03 <0.03 ng/mL  Comprehensive metabolic panel     Status: Abnormal   Collection Time: 12/08/16  2:38 AM  Result Value Ref Range   Sodium 136 135 - 145 mmol/L   Potassium 3.2 (L) 3.5 - 5.1 mmol/L   Chloride 106 101 - 111 mmol/L   CO2 23 22 - 32  mmol/L   Glucose, Bld 112 (H) 65 - 99 mg/dL   BUN 22 (H) 6 - 20 mg/dL   Creatinine, Ser 1.04 0.61 - 1.24 mg/dL   Calcium 7.7 (L) 8.9 - 10.3 mg/dL   Total Protein 5.9 (L) 6.5 - 8.1 g/dL   Albumin 2.4 (L) 3.5 - 5.0 g/dL   AST 17 15 - 41 U/L   ALT 18 17 - 63 U/L   Alkaline Phosphatase 68 38 - 126 U/L   Total Bilirubin 0.8 0.3 - 1.2 mg/dL   GFR calc non Af Amer >60 >60 mL/min   GFR calc Af Amer >60 >60 mL/min    Comment: (NOTE) The eGFR has been calculated using the CKD EPI equation. This calculation has not been validated in all clinical situations. eGFR's persistently <60 mL/min signify possible Chronic Kidney Disease.    Anion gap 7 5 - 15  CBC  Status: Abnormal   Collection Time: 12/08/16  2:38 AM  Result Value Ref Range   WBC 8.9 4.0 - 10.5 K/uL   RBC 4.21 (L) 4.22 - 5.81 MIL/uL   Hemoglobin 13.3 13.0 - 17.0 g/dL   HCT 40.0 39.0 - 52.0 %   MCV 95.0 78.0 - 100.0 fL   MCH 31.6 26.0 - 34.0 pg   MCHC 33.3 30.0 - 36.0 g/dL   RDW 13.0 11.5 - 15.5 %   Platelets 187 150 - 400 K/uL     Lipid Panel     Component Value Date/Time   CHOL 144 08/26/2015 1042   TRIG 135 08/26/2015 1042   HDL 49 08/26/2015 1042   CHOLHDL 2.9 08/26/2015 1042   LDLCALC 68 08/26/2015 1042     Lab Results  Component Value Date   HGBA1C  Aug 19, 202010    5.3 (NOTE) The ADA recommends the following therapeutic goal for glycemic control related to Hgb A1c measurement: Goal of therapy: <6.5 Hgb A1c  Reference: American Diabetes Association: Clinical Practice Recommendations 2010, Diabetes Care, 2010, 33: (Suppl  1).       HPI :*    83 y.o. male  significant for history of atrial fibrillation, history of colon cancer, a blood pressure, heart hearing, focal cord cancer, status post salvage laryngectomy who presents with shortness of breath and weakness. Patient over the last less than 5 days began to progressively weak. Patient also previously and short of breath. Of note patient had a box removed  from his laryngectomy by nose and throat recently. Patient was warned that she may have risk of aspiration for for 7 days. Patient continued to eat and drink as normal. Patient has a chronic productive cough which has not gotten worse. History of being a shallow breather and tachypnea at Baseline.  ED Course: Bolus of fluid. Followed by [facility]. CXR showed pulm edema. Hospitalist consulted for admit.   HOSPITAL COURSE:   Shortness of breath due to multifocal PNA -,   given history of multiple malignancies, recent surgery one week ago,   CT chest to ruled out PE found to have multifocal  PNA  , Troponin negative 3 2-D echo ordered but pending at the time of discharge  tele bed, cardiac monitoring uneventful Patient not changed to [facility] for 5 days    Hypertension Blood pressure remained soft during this admission Have discontinued Norvasc  Hx Of Afib Not on any long-term anticoagulation, rate controlled  Hyperlipidemia Continue statin  Hx prolonged QT Telemetry stable      Discharge Exam:  Blood pressure (!) 107/56, pulse 68, temperature 98.3 F (36.8 C), temperature source Oral, resp. rate 18, height 5' 11"  (1.803 m), weight 80.8 kg (178 lb 3.2 oz), SpO2 95 %.   General:  Appears calm and comfortable, A&Ox3, limited verbal ability  Eyes:  PERRL, EOMI, normal lids, iris  ENT:  grossly normal hearing, lips & tongue, laryngectomy stoma present  Neck:  no LAD, masses or thyromegaly  Cardiovascular:  RRR, no m/r/g. No LE edema.   Respiratory:  Rhonchi, shallow breaths, tachypnea in low 30s  Abdomen:  soft, ntnd  Skin:  no rash or induration seen on limited exam  Musculoskeletal:  grossly normal tone BUE/BLE  Psychiatric:  grossly normal mood and affect  Neurologic:  CN 2-12 grossly intact, moves all extremities in coordinated fashion      Signed: Reyne Dumas 12/08/2016, 11:28 AM        Time spent >45 mins

## 2016-12-08 NOTE — Progress Notes (Signed)
  Echocardiogram 2D Echocardiogram has been performed.  Darlina Sicilian M 12/08/2016, 2:57 PM

## 2016-12-08 NOTE — Care Management Note (Addendum)
Case Management Note Marvetta Gibbons RN, BSN Unit 2W-Case Manager [phone]  Patient Details  Name: Timothy Suarez MRN: [id number] Date of Birth: February 17, 1925  Subjective/Objective:    Pt presented with pna, pulm. edema                Action/Plan: PTA pt lived in apartment in [place] at [place] to return to [facility]- with [facility] services- orders for HHPT/OT/aide- spoke with pt who requested CM call his daughterDyanne Carrel- [phone] or cell [phone]- call made to Santiago Glad- to discuss d/c plans and [facility]- choice offered for [facility] for either in house [facility] there at [facility] or outside [facility] agency- per Santiago Glad pt has used [facility] in past but she would prefer outside agency this time that works with [facility] insurance- daughter chose [facility] for services- referral called to Santiago Glad with [facility] for HHPT/OT services - daughter declined aide need. -- Daughter will be here around 3:30 to pick pt up- bedside RN aware.   Expected Discharge Date:      12/08/16            Expected Discharge Plan:  Home/Self Care  In-House Referral:  Clinical Social Work  Discharge planning Services  CM Consult  Post Acute Care Choice:  Home Health Choice offered to:  Patient, Adult Children  DME Arranged:    DME Agency:     HH Arranged:  PT, OT, Nurse's Aide [place] Agency:  [place]  Status of Service:  Completed, signed off  If discussed at [place] of Stay Meetings, dates discussed:    Discharge Disposition: home with home health   Additional Comments:  Dawayne Patricia, RN 12/08/2016, 12:06 PM

## 2016-12-09 NOTE — Progress Notes (Signed)
Speech Pathology: Late entry from 12/08/16  12/08/16 1100  SLP Visit Information  SLP Received On 12/08/16  Subjective  Subjective pleasant; uses dry erase board to write for communication  General Information  Date of Onset 12/06/16  HPI 83 y.o.malewith history total laryngectomy June 2017, TEP Aug 2017, pharyngocutaneous fistula repair, afib, colon cancer,  presented to ED with shortness of breath and weakness. CXR with pulm edema.  Dx multifocal pna, HTN. SLP services asked to evaluate swallowing.   Type of Study Bedside Swallow Evaluation  Diet Prior to this Study Regular;Thin liquids  Temperature Spikes Noted No  Respiratory Status Room air (stoma breather)  History of Recent Intubation No  Behavior/Cognition Alert;Cooperative  Oral Cavity Assessment WFL  Oral Care Completed by SLP No  Oral Cavity - Dentition Dentures, top  Vision Functional for self-feeding  Self-Feeding Abilities Able to feed self  Patient Positioning Upright in chair  Baseline Vocal Quality Aphonic (due to laryngectomy)  Volitional Swallow Able to elicit  Oral Motor/Sensory Function  Overall Oral Motor/Sensory Function WFL  Ice Chips  Ice chips WFL  Thin Liquid  Thin Liquid WFL  Nectar Thick Liquid  Nectar Thick Liquid NT  Honey Thick Liquid  Honey Thick Liquid NT  Puree  Puree NT  Solid  Solid NT  SLP Assessment  Clinical Impression Statement (ACUTE ONLY) Pt has been complaining to staff that "food is going down the wrong way." Spent time with pt sharing images of laryngectomy, and explaining why it is no longer anatomically possible to aspirate given the separation of upper airway from pharynx/oral cavity (excluding posterior fistula).  Pt may have issues with decreased transit, especially of solid foods, through esophagus, causing globus, but he is no longer at risk for aspiration pna.  We discussed his current situation - he states he continues to be followed by Dr. Constance Holster, ENT, and has been  followed by a physician in [facility] for TEP management.  Pt using whiteboard to write messages.  Recommend continuing his current diet - no SLP f/u for swallowing needed; he will benefit from ongoing SLP services as an OP (or at ALF) to assist with communication.  D/W RN.   SLP Visit Diagnosis Dysphagia, unspecified (R13.10)  Impact on safety and function No limitations  Swallow Evaluation Recommendations  SLP Diet Recommendations Thin;Age appropriate regular  Liquid Administration via Cup;Straw  Medication Administration Whole meds with liquid  Supervision Patient able to self feed  Treatment Plan  Oral Care Recommendations Oral care BID  Follow up Recommendations (continue SLP services)  Individuals Consulted  Consulted and Agree with Results and Recommendations Patient  SLP Time Calculation  SLP Start Time (ACUTE ONLY) 1120  SLP Stop Time (ACUTE ONLY) 1150  SLP Time Calculation (min) (ACUTE ONLY) 30 min  SLP G-Codes **NOT FOR INPATIENT CLASS**  Functional Assessment Tool Used clinical judgment  Functional Limitations Swallowing  Swallow Current Status (A8341) CI  Swallow Goal Status (D6222) CI  Swallow Discharge Status (L7989) CI  SLP Evaluations  $ SLP Speech Visit 1 Procedure  SLP Evaluations  $BSS Swallow 1 Procedure

## 2016-12-10 ENCOUNTER — Telehealth: Payer: Self-pay

## 2016-12-10 NOTE — Progress Notes (Signed)
Addendum to 12-23-22 physical therapy note   Dec 22, 2016 1155  PT G-Codes **NOT FOR INPATIENT CLASS**  Functional Assessment Tool Used Clinical judgement  Functional Limitation Mobility: Walking and moving around  Mobility: Walking and Moving Around Current Status [phone]) CJ  Mobility: Walking and Moving Around Goal Status (J8841) CI  Timothy Suarez, [place]

## 2016-12-10 NOTE — Telephone Encounter (Signed)
Transition Care Management Follow-Up Telephone Call   Date discharged and where: [facility] on 12/08/2016  How have you been since you were released from the hospital? Per son, he has been able to eat, and PT has been over working with him  Any patient concerns? None  Items Reviewed:   Meds: Y  Allergies: Y  Dietary Changes Reviewed: Y  Functional Questionnaire:  Independent-I Dependent-D  ADLs:   Dressing- I    Eating- I   Maintaining continence- I   Transferring- I w/ walker   Transportation- I, but not driving since hospital    Meal Prep- I   Managing Meds- I  Confirmed importance and Date/Time of follow-up visits scheduled: Y. Appointment made on 5/14 @ 10:45 am with Sherrie Mustache NP   Confirmed with patient if condition worsens to call PCP or go to the Emergency Dept. Patient was given office number and encouraged to call back with questions or concerns: Darreld Mclean

## 2016-12-21 ENCOUNTER — Ambulatory Visit (INDEPENDENT_AMBULATORY_CARE_PROVIDER_SITE_OTHER): Payer: Medicare Other | Admitting: Nurse Practitioner

## 2016-12-21 ENCOUNTER — Encounter: Payer: Self-pay | Admitting: Nurse Practitioner

## 2016-12-21 ENCOUNTER — Other Ambulatory Visit: Payer: Medicare Other

## 2016-12-21 VITALS — BP 122/68 | HR 62 | Temp 98.2°F | Resp 18 | Ht 71.0 in | Wt 177.6 lb

## 2016-12-21 DIAGNOSIS — I10 Essential (primary) hypertension: Secondary | ICD-10-CM

## 2016-12-21 DIAGNOSIS — C320 Malignant neoplasm of glottis: Secondary | ICD-10-CM

## 2016-12-21 DIAGNOSIS — E785 Hyperlipidemia, unspecified: Secondary | ICD-10-CM | POA: Diagnosis not present

## 2016-12-21 DIAGNOSIS — J690 Pneumonitis due to inhalation of food and vomit: Secondary | ICD-10-CM | POA: Diagnosis not present

## 2016-12-21 DIAGNOSIS — I5022 Chronic systolic (congestive) heart failure: Secondary | ICD-10-CM | POA: Diagnosis not present

## 2016-12-21 LAB — LIPID PANEL
Cholesterol: 141 mg/dL (ref <200)
HDL: 52 mg/dL (ref >40)
LDL Cholesterol: 77 mg/dL (ref <100)
Total CHOL/HDL Ratio: 2.7 Ratio (ref <5.0)
Triglycerides: 62 mg/dL (ref <150)
VLDL: 12 mg/dL (ref <30)

## 2016-12-21 NOTE — Progress Notes (Signed)
Careteam: Patient Care Team: Gayland Curry, DO as PCP - General (Geriatric Medicine) Sueanne Margarita, MD as Consulting Physician (Cardiology) Tyler Pita, MD as Consulting Physician (Radiation Oncology) Izora Gala, MD as Consulting Physician (Otolaryngology)  Advanced Directive information Does Patient Have a Medical Advance Directive?: Yes, Type of Advance Directive: Living will  Allergies  Allergen Reactions  . Citalopram Nausea Only  . Trazodone And Nefazodone Other (See Comments)    Dry mouth  . Zoloft [Sertraline Hcl] Other (See Comments)    dizzy    Chief Complaint  Patient presents with  . Transitions Of Care    Pt recently discharged from [facility] after 4/29 to 5/1 stay for multifocal pneumonia     HPI: Patient is a 83 y.o. male seen in the office today for hospital follow up.  significant for history of atrial fibrillation, history of colon cancer, a blood pressure, heart hearing, focal cord cancer, status post salvage laryngectomy who went to the hospital with shortness of breath and weakness. He had a box removed from his laryngectomy by nose and throat recently. Patient was warned that risk of aspiration was greater for for 7 days. Patient continued to eat and drink as normal. Patient has a chronic productive cough which has not gotten worse. History of being a shallow breather and tachypnea at baseline. Due to pts shortness of breath, multiple malignancies and recent surgeries he was admitted to hospital for further work up which revealed multifocal pneumonia. Was discharged on V[place] and completed course. Feeling well now.  Working with PT and [place] at home.  Unsure if he is taking amlodipine or not at home.   Review of Systems:  Review of Systems  Constitutional: Negative for activity change, appetite change, fatigue and unexpected weight change.  HENT: Negative for congestion and hearing loss.   Eyes: Negative.   Respiratory: Negative for cough and  shortness of breath.   Cardiovascular: Negative for chest pain, palpitations and leg swelling.  Gastrointestinal: Negative for abdominal pain, constipation and diarrhea.  Genitourinary: Negative for difficulty urinating and dysuria.  Skin: Negative for color change and wound.  Neurological: Negative for dizziness and weakness.    Past Medical History:  Diagnosis Date  . A-fib ([place])   . Aortic valve insufficiency, acquired   . Arthritis   . BPH (benign prostatic hyperplasia)   . Cerebral ischemia   . Colon cancer [facility])     s/p partial colectomy  . Complication of anesthesia    "woke up w/confusion and hallucinations once after mitral valve OR"  . Coronary artery disease    50-70% LAD mitral valve repair 4+ yrs ago  . Depression   . Dyslipidemia   . Glaucoma   . Heart murmur   . Hernia   . High cholesterol   . History of echocardiogram    Echo 5/16:  Mild LVH, EF 50-55%, Gr 1 DD, septal HK, Ao sclerosis without stenosis, mild AI, MV repair ok with borderline mild MS (mean 4 mmHg), mild MR, mild LAE, mod RAE, mild TR, PASP 30 mmHg  . HTN (hypertension)    takes Amlodipine daily  . Hx of cardiovascular stress test    Lexiscan Myoview 5/16:  Apical thinning, EF not gated, no ischemia. Low Risk  . Hyperlipidemia   . Major depression   . Mild cognitive impairment   . MVP (mitral valve prolapse)    S/P Rt mini thoractomy for Mitral Valve repair  . Nocturia   . Paroxysmal  atrial fibrillation (HCC)    S/P Maze procedure  . 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 Prostate cancer ([place])   . 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 Shortness of breath dyspnea    with exertion  . Sleep disturbance   . Thoracic aortic aneurysm ([place])   . Urinary frequency   . Urinary urgency    Past Surgical History:  Procedure Laterality Date  . CARDIAC CATHETERIZATION    . CATARACT EXTRACTION W/ INTRAOCULAR LENS  IMPLANT,  BILATERAL Bilateral   . COLECTOMY  2004   Dr [name]  . DIRECT LARYNGOSCOPY N/A 01/15/2016   Procedure: DIRECT LARYNGOSCOPY WITH BIOPSY AND FROZEN SECTION;  Surgeon: Izora Gala, MD;  Location: [place];  Service: ENT;  Laterality: N/A;  . FlexHD patch repair of chest wall hernia.  01/28/2011   [name]  . GASTROSTOMY W/ FEEDING TUBE    . IR GENERIC HISTORICAL  10/08/2016   IR GASTROSTOMY TUBE REMOVAL 10/08/2016 Sandi Mariscal, MD [facility]-INTERV RAD  . JOINT REPLACEMENT    . LARYNGETOMY N/A 01/15/2016   Procedure:  TOTAL LARYNGECTOMY;  Surgeon: Izora Gala, MD;  [facility];  Service: ENT;  Laterality: N/A;  . MAZE  12/27/2008   left side lesion set  . MICROLARYNGOSCOPY Right 01/29/2015   Procedure: MICROLARYNGOSCOPY WITH BIOSPY OF RIGHT VOCAL CORD;  Surgeon: Izora Gala, MD;  Location: [employer];  Service: ENT;  Laterality: Right;  . MITRAL VALVE REPAIR  12/27/2008   complex valvuloplasty with 61mm Memo 3D annuloplasty ring via right minithoracotomy  . PECTORALIS FLAP Left 05/04/2016   Procedure: PECTORALIS FLAP to neck with possible, split thickness skin graft;  Surgeon: Loel Lofty Dillingham, DO;  Location: [place];  Service: Plastics;  Laterality: Left;  . PROSTATE BIOPSY    . SKIN SPLIT GRAFT Left 05/04/2016   Procedure: PECTORALIS MAJOR MYOCUTANEOUS FLAP RECONSTRUCTION OF PHARYNX AND SPLIT THICKNESS SKIN GRAFT;  Surgeon: Izora Gala, MD;  Location: [place];  Service: ENT;  Laterality: Left;  . TEE WITHOUT CARDIOVERSION N/A 01/03/2013   Procedure: TRANSESOPHAGEAL ECHOCARDIOGRAM (TEE);  Surgeon: Sueanne Margarita, MD;  Location: [place];  Service: Cardiovascular;  Laterality: N/A;  . TOTAL KNEE ARTHROPLASTY Right 2003  . TRACHEAL ESOPHAGEAL PUNCTURE REPAIR N/A 03/26/2016   Procedure: TRACHEAL ESOPHAGEAL PUNCTURE;  Surgeon: Izora Gala, MD;  Location: [facility] OR;  Service: ENT;  Laterality: N/A;  . TRACHEOESOPHAGEAL FISTULA REPAIR N/A 03/26/2016   Procedure: TRACHEO-ESOPHAGEAL   PUNCTURE,FISTULAR CLOSURE;  Surgeon: Izora Gala, MD;  Location: [place];  Service: ENT;  Laterality: N/A;  . TRACHEOESOPHAGEAL FISTULA REPAIR N/A 04/09/2016   Procedure: FISTULA REPAIR WITH  MUSCLE ROTATION FLAP;  Surgeon: Izora Gala, MD;  Location: [place];  Service: ENT;  Laterality: N/A;  . TRACHEOESOPHAGEAL FISTULA REPAIR N/A 07/27/2016   Procedure: CLOSURE OF FISTULA;  Surgeon: Izora Gala, MD;  Location: [place];  Service: ENT;  Laterality: N/A;  . TYMPANOPLASTY  1967   "? side"   Social History:   reports that he has never smoked. He has never used smokeless tobacco. He reports that he drinks alcohol. He reports that he does not use drugs.  Family History  Problem Relation Age of Onset  . Cancer Mother        lymphoma  . Cancer Father        pancreatic  . Heart attack Neg Hx   . Stroke Neg Hx  Medications: Patient's Medications  New Prescriptions   No medications on file  Previous Medications   ATORVASTATIN (LIPITOR) 40 MG TABLET    OVERDUE for cholesterol labs, 1 tablet by mouth daily at 6 pm for cholesterol   CHOLECALCIFEROL (VITAMIN D) 2000 UNITS CAPS    Give 2,000 Units by tube daily.    ZOLPIDEM (AMBIEN) 5 MG TABLET    Take 2.5-5 mg by mouth at bedtime as needed for sleep.  Modified Medications   No medications on file  Discontinued Medications   No medications on file     Physical Exam:  Vitals:   12/21/16 1011  BP: 122/68  Pulse: 62  Resp: 18  Temp: 98.2 F (36.8 C)  TempSrc: Oral  SpO2: 96%  Weight: 177 lb 9.6 oz (80.6 kg)  Height: 5\' 11"  (1.803 m)   Body mass index is 24.77 kg/m.  Physical Exam  Constitutional: He is oriented to person, place, and time. He appears well-developed and well-nourished. No distress.  HENT:  Head: Normocephalic and atraumatic.  Cardiovascular: Normal rate, regular rhythm and normal heart sounds.   Pulmonary/Chest: Effort normal and breath sounds normal. No respiratory distress.  Abdominal: Soft. Bowel sounds are normal.  PEG in place, but not getting used    Musculoskeletal: He exhibits no edema.  Ambulates with cane for balance and wearing brace on left knee  Neurological: He is alert and oriented to person, place, and time.  Skin: Skin is warm and dry.  Psychiatric: He has a normal mood and affect.    Labs reviewed: Basic Metabolic Panel:  Recent Labs  07/27/16 1000 12/06/16 1048 12/06/16 1350 12/08/16 0238  NA 140 138  --  136  K 3.9 3.8  --  3.2*  CL 110 109  --  106  CO2 25 21*  --  23  GLUCOSE 93 88  --  112*  BUN 23* 22*  --  22*  CREATININE 1.03 0.88 0.87 1.04  CALCIUM 8.6* 7.8*  --  7.7*  MG  --  2.1  --   --   PHOS  --  2.9  --   --    Liver Function Tests:  Recent Labs  02/21/16 1823 12/06/16 1048 12/08/16 0238  AST 19 21 17   ALT 14* 19 18  ALKPHOS 123 64 68  BILITOT 0.6 1.3* 0.8  PROT 6.8 5.9* 5.9*  ALBUMIN 3.4* 2.6* 2.4*   No results for input(s): LIPASE, AMYLASE in the last 8760 hours. No results for input(s): AMMONIA in the last 8760 hours. CBC:  Recent Labs  01/17/16 0217  12/06/16 0910 12/06/16 1350 12/08/16 0238  WBC 10.4  < > 9.8 8.5 8.9  NEUTROABS 8.5*  --  7.5  --   --   HGB 12.0*  < > 13.9 14.3 13.3  HCT 37.6*  < > 41.7 43.1 40.0  MCV 95.9  < > 97.4 98.2 95.0  PLT 165  < > 156 159 187  < > = values in this interval not displayed. Lipid Panel: No results for input(s): CHOL, HDL, LDLCALC, TRIG, CHOLHDL, LDLDIRECT in the last 8760 hours. TSH: No results for input(s): TSH in the last 8760 hours. A1C: Lab Results  Component Value Date   HGBA1C  04-23-2009    5.3 (NOTE) The ADA recommends the following therapeutic goal for glycemic control related to Hgb A1c measurement: Goal of therapy: <6.5 Hgb A1c  Reference: American Diabetes Association: Clinical Practice Recommendations 2010, Diabetes Care, 2010, 33: (Suppl  1).   12/08/16 ECHO complete  - Left ventricle: The cavity size was mildly dilated. Wall   thickness was normal. Systolic function was severely reduced. The   estimated  ejection fraction was in the range of 25% to 30%.   Diffuse hypokinesis. - Aortic valve: There was mild regurgitation. - Aortic root: The aortic root was mildly dilated. - Mitral valve: Prior procedures included surgical repair. There   was mild regurgitation. - Left atrium: The atrium was mildly dilated.  Impressions: - Severe global reduction in LV systoilc function; mild LVE; mild   to moderate AI; mildly dilated aortic root; s/p MV repair with   mild MR; mild LAE; mild TR.  Dg Chest 2 View  Result Date: 12/06/2016 CLINICAL DATA:  Worsening cough and weakness for 1 week. EXAM: CHEST  2 VIEW COMPARISON:  02/21/2016 FINDINGS: Stable cardiomegaly. Increased symmetric bilateral lower lung airspace disease, suspicious for pulmonary edema or less likely pneumonia. Minimal pleural fluid seen tracking within the fissures, without free-flowing effusion. IMPRESSION: Interval worsening of bibasilar airspace disease, suspicious for pulmonary edema with pneumonia considered less likely. Stable cardiomegaly. Electronically Signed   By: Earle Gell M.D.   On: 12/06/2016 09:32   Ct Angio Chest Pe W Or Wo Contrast  Result Date: 12/07/2016 CLINICAL DATA:  Shortness of breath, evaluate for PE EXAM: CT ANGIOGRAPHY CHEST WITH CONTRAST TECHNIQUE: Multidetector CT imaging of the chest was performed using the standard protocol during bolus administration of intravenous contrast. Multiplanar CT image reconstructions and MIPs were obtained to evaluate the vascular anatomy. CONTRAST:  100 mL Isovue 370 IV COMPARISON:  Chest radiographs dated 12/07/2016 FINDINGS: Cardiovascular: Satisfactory opacification of the bilateral pulmonary arteries to the lobar level. Evaluation of the bilateral lower lobe pulmonary arteries is mildly constrained by respiratory motion. No evidence of pulmonary embolism. Cardiomegaly.  No pericardial effusion.  Prosthetic mitral valve. Coronary atherosclerosis of the LAD. No evidence of thoracic  aortic aneurysm. Atherosclerotic calcifications of the aortic arch. Mediastinum/Nodes: Small mediastinal lymph nodes which do not meet pathologic CT size criteria. Lungs/Pleura: Mild patchy opacities in the lingula, right middle lobe, and bilateral lower lobes (lower lobe predominant). This appearance favors multifocal pneumonia over interstitial edema. Trace bilateral pleural effusions. No suspicious pulmonary nodules. No pneumothorax. Upper Abdomen: Visualized upper abdomen is grossly unremarkable. Musculoskeletal: Visualized osseous structures are within normal limits. Review of the MIP images confirms the above findings. IMPRESSION: No evidence of pulmonary embolism. Multifocal patchy opacities, lower lobe predominant, suspicious for multifocal pneumonia. Trace bilateral pleural effusions. Electronically Signed   By: Julian Hy M.D.   On: 12/07/2016 14:36   Dg Chest Port 1 View  Result Date: 12/07/2016 CLINICAL DATA:  Shortness of breath.  Pulmonary edema. EXAM: PORTABLE CHEST 1 VIEW COMPARISON:  12/06/2016. FINDINGS: Cardiomegaly. Prior cardiac valve replacement. Bilateral pulmonary infiltrates/ edema again noted without interim change. Tiny bilateral pleural effusions again cannot be excluded. No pneumothorax. IMPRESSION: 1. Bilateral pulmonary infiltrates/edema again noted. Tiny bilateral pleural effusions cannot be excluded. Similar findings noted on prior exam. 2.Cardiomegaly with prior cardiac valve replacement. Electronically Signed   By: Marcello Moores  Register   On: 12/07/2016 07:20    Assessment/Plan 1. Aspiration pneumonia of both lungs, unspecified aspiration pneumonia type, unspecified part of lung ([place]) Completed antibiotic, no further shortness of breath or cough. Doing well at this time and conts to work with PT  2. Essential hypertension Blood pressure is stable at this time. Was instructed to stop taking norvasc during hospital admission due to hypotension.  Son-in-law will verify  medication has been stopped.   3. Chronic systolic congestive heart failure (HCC) EF reduced on recent echo, reviewed with pt and son-in-law. Will make sure he is not taking norvasc before adding additional medications. Currently euvolemic.  Pt with hx of being dehydrated in the past.   4. 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 Hyperlipidemia, unspecified hyperlipidemia type -fasting labs obtained today, conts on lipitor.   To keep follow up with Dr Mariea Clonts as scheduled on 12/31/16 [employer]. Harle Battiest  [facility] [phone] 8 am - 5 pm) [phone] (after hours)

## 2016-12-21 NOTE — Patient Instructions (Addendum)
Please verify if you are taking the amlodipine if he is please remove from medication. To keep follow up with Dr Mariea Clonts

## 2016-12-22 ENCOUNTER — Other Ambulatory Visit: Payer: Medicare Other

## 2016-12-25 ENCOUNTER — Telehealth: Payer: Self-pay | Admitting: Internal Medicine

## 2016-12-25 NOTE — Telephone Encounter (Signed)
left msg asking if pt can come in on 12/30/16 for AWV-I due to Clarise Cruz being in nursing home on 12/31/16. VDM (DD)

## 2016-12-29 DIAGNOSIS — R931 Abnormal findings on diagnostic imaging of heart and coronary circulation: Secondary | ICD-10-CM | POA: Insufficient documentation

## 2016-12-29 DIAGNOSIS — R9430 Abnormal result of cardiovascular function study, unspecified: Secondary | ICD-10-CM | POA: Insufficient documentation

## 2016-12-30 ENCOUNTER — Other Ambulatory Visit: Payer: Self-pay | Admitting: Nurse Practitioner

## 2016-12-30 ENCOUNTER — Ambulatory Visit: Payer: Medicare Other

## 2016-12-31 ENCOUNTER — Encounter: Payer: Medicare Other | Admitting: Internal Medicine

## 2016-12-31 ENCOUNTER — Ambulatory Visit: Payer: Medicare Other | Admitting: Internal Medicine

## 2016-12-31 ENCOUNTER — Ambulatory Visit: Payer: Medicare Other

## 2017-01-08 DIAGNOSIS — Z4682 Encounter for fitting and adjustment of non-vascular catheter: Secondary | ICD-10-CM | POA: Diagnosis not present

## 2017-01-23 DIAGNOSIS — Z9002 Acquired absence of larynx: Secondary | ICD-10-CM | POA: Diagnosis not present

## 2017-01-23 DIAGNOSIS — C320 Malignant neoplasm of glottis: Secondary | ICD-10-CM | POA: Diagnosis not present

## 2017-01-27 DIAGNOSIS — Z48815 Encounter for surgical aftercare following surgery on the digestive system: Secondary | ICD-10-CM | POA: Diagnosis not present

## 2017-02-02 ENCOUNTER — Other Ambulatory Visit: Payer: Self-pay | Admitting: Internal Medicine

## 2017-02-08 ENCOUNTER — Encounter: Payer: Medicare Other | Admitting: Internal Medicine

## 2017-02-16 ENCOUNTER — Other Ambulatory Visit: Payer: Self-pay

## 2017-02-16 ENCOUNTER — Telehealth: Payer: Self-pay

## 2017-02-16 DIAGNOSIS — M7122 Synovial cyst of popliteal space [Baker], left knee: Secondary | ICD-10-CM

## 2017-02-16 NOTE — Telephone Encounter (Signed)
Patient's daughter, Hilda Blades, called and asked if Dr. Mariea Clonts would give order to have Baker's cyst drained again. Pt normally has draining done at [facility], Interventional radiology.   Hilda Blades asked that we call her home number [phone] or cell at [phone] due to patient being unable to talk due to throat surgery.    Please advise.

## 2017-02-16 NOTE — Telephone Encounter (Signed)
Order was placed for US Aspiration. Patient's daughter was notified and asked that her cell phone number be used to schedule appt.   Timothy Suarez [phone]

## 2017-02-16 NOTE — Telephone Encounter (Signed)
Ok to do this again.  He last had it just over 3 mos ago.  We can duplicate that prior order to interventional radiology at [place] for US aspiration of the Baker's cyst.

## 2017-02-18 NOTE — Telephone Encounter (Signed)
Received call from [facility] stating to print out US Aspiration order and write on it for Aspiration and Injection and fax back to [phone]. Faxed.

## 2017-02-22 DIAGNOSIS — C320 Malignant neoplasm of glottis: Secondary | ICD-10-CM | POA: Diagnosis not present

## 2017-02-22 DIAGNOSIS — Z9002 Acquired absence of larynx: Secondary | ICD-10-CM | POA: Diagnosis not present

## 2017-02-24 ENCOUNTER — Ambulatory Visit
Admission: RE | Admit: 2017-02-24 | Discharge: 2017-02-24 | Disposition: A | Discharge status: Home / Self Care | Payer: Medicare Other | Source: Ambulatory Visit | Attending: Internal Medicine | Admitting: Internal Medicine

## 2017-02-24 DIAGNOSIS — M7122 Synovial cyst of popliteal space [Baker], left knee: Secondary | ICD-10-CM

## 2017-03-01 ENCOUNTER — Encounter: Payer: Self-pay | Admitting: Internal Medicine

## 2017-03-01 ENCOUNTER — Ambulatory Visit (INDEPENDENT_AMBULATORY_CARE_PROVIDER_SITE_OTHER): Payer: Medicare Other | Admitting: Internal Medicine

## 2017-03-01 ENCOUNTER — Ambulatory Visit (INDEPENDENT_AMBULATORY_CARE_PROVIDER_SITE_OTHER): Payer: Medicare Other

## 2017-03-01 VITALS — BP 130/60 | HR 71 | Temp 98.0°F | Wt 184.0 lb

## 2017-03-01 VITALS — BP 130/60 | HR 68 | Temp 98.0°F | Ht 71.0 in | Wt 184.0 lb

## 2017-03-01 DIAGNOSIS — M7122 Synovial cyst of popliteal space [Baker], left knee: Secondary | ICD-10-CM | POA: Diagnosis not present

## 2017-03-01 DIAGNOSIS — Z23 Encounter for immunization: Secondary | ICD-10-CM

## 2017-03-01 DIAGNOSIS — I5022 Chronic systolic (congestive) heart failure: Secondary | ICD-10-CM | POA: Diagnosis not present

## 2017-03-01 DIAGNOSIS — Z0000 Encounter for general adult medical examination without abnormal findings: Secondary | ICD-10-CM | POA: Diagnosis not present

## 2017-03-01 DIAGNOSIS — Z9002 Acquired absence of larynx: Secondary | ICD-10-CM | POA: Diagnosis not present

## 2017-03-01 DIAGNOSIS — I10 Essential (primary) hypertension: Secondary | ICD-10-CM

## 2017-03-01 DIAGNOSIS — G3184 Mild cognitive impairment, so stated: Secondary | ICD-10-CM | POA: Diagnosis not present

## 2017-03-01 MED ORDER — ZOLPIDEM TARTRATE 5 MG PO TABS: ORAL_TABLET | ORAL | 3 refills | Status: DC

## 2017-03-01 MED ORDER — ZOSTER VAC RECOMB ADJUVANTED 50 MCG/0.5ML IM SUSR: 0.5000 mL | Freq: Once | INTRAMUSCULAR | 1 refills | Status: AC

## 2017-03-01 NOTE — Progress Notes (Signed)
Location:  [facility] clinic Provider: Kacy Conely L. Mariea Clonts, D.O., C.M.D.  Patient Care Team: Gayland Curry, DO as PCP - General (Geriatric Medicine) Sueanne Margarita, MD as Consulting Physician (Cardiology) Tyler Pita, MD as Consulting Physician (Radiation Oncology) Izora Gala, MD as Consulting Physician (Otolaryngology)  Extended Emergency Contact Information Primary Emergency Contact: [place] Address: [address]          [place], [place] [address] Johnnette Litter of [place] Phone: [phone] Mobile Phone: [phone] Relation: Daughter Secondary Emergency Contact: [place] of [place] Mobile Phone: [phone] Relation: Daughter  Code Status: DNR Goals of Care: Advanced Directive information Advanced Directives 03/01/2017  Does Patient Have a Medical Advance Directive? Yes  Type of Advance Directive Living will  Does patient want to make changes to medical advance directive? -  Copy of [place] in Chart? -  Pre-existing out of facility DNR order (yellow form or pink MOST form) -     Chief Complaint  Patient presents with  . Annual Exam    CPE    HPI: Patient is a 83 y.o. male seen in today for an annual physical.    Since his last visit here, he had one more surgery to close up his stoma.  He now has a speech amplifier and I can understand the majority of what he says.  Unfortunately, where he lives, most people are hard of hearing.    Doing pretty well eating now.  Eating two meals per day.  He's gaining some of his weight back.    Had his baker's cyst aspirated again.    He is agreeable to shingrix.  He tells me he has not had trouble getting shots like when he had to have seven at once when he was in the [employer].    Depression screen [facility] 2/9 03/01/2017 09/21/2016 07/30/2016 12/09/2015 11/04/2015  Decreased Interest 0 0 0 0 0  Down, Depressed, Hopeless 0 0 0 0 0  PHQ - 2 Score 0 0 0 0 0  Altered sleeping - - - - -    Tired, decreased energy - - - - -  Change in appetite - - - - -  Feeling bad or failure about yourself  - - - - -  Trouble concentrating - - - - -  Moving slowly or fidgety/restless - - - - -  Suicidal thoughts - - - - -  PHQ-9 Score - - - - -    Fall Risk  03/01/2017 12/21/2016 09/21/2016 07/30/2016 06/19/2016  Falls in the past year? No No No No No  Number falls in past yr: - - - - -  Injury with Fall? - - - - -  Risk for fall due to : - - - - -  Follow up - - - - -   MMSE - [place] Exam 03/01/2017 12/09/2015 11/09/2014 04/02/2014  Orientation to time 5 3 5 4   Orientation to Place 5 4 5 5   Registration 3 3 3 3   Attention/ Calculation 5 4 5 3   Recall 0 1 1 1   Language- name 2 objects 2 2 2 2   Language- repeat 1 1 1 1   Language- follow 3 step command 3 3 3 3   Language- read & follow direction 1 1 1 1   Write a sentence 1 1 1 1   Copy design 0 1 1 1   Total score 26 24 28 25      Health Maintenance  Topic Date  Due  . INFLUENZA VACCINE  03/10/2017  . TETANUS/TDAP  08/11/2023  . PNA vac Low Risk Adult  Completed    Past Medical History:  Diagnosis Date  . A-fib ([place])   . Aortic valve insufficiency, acquired   . Arthritis   . BPH (benign prostatic hyperplasia)   . Cerebral ischemia   . Colon cancer [facility])     s/p partial colectomy  . Complication of anesthesia    "woke up w/confusion and hallucinations once after mitral valve OR"  . Coronary artery disease    50-70% LAD mitral valve repair 4+ yrs ago  . Depression   . Dyslipidemia   . Glaucoma   . Heart murmur   . Hernia   . High cholesterol   . History of echocardiogram    Echo 5/16:  Mild LVH, EF 50-55%, Gr 1 DD, septal HK, Ao sclerosis without stenosis, mild AI, MV repair ok with borderline mild MS (mean 4 mmHg), mild MR, mild LAE, mod RAE, mild TR, PASP 30 mmHg  . HTN (hypertension)    takes Amlodipine daily  . Hx of cardiovascular stress test    Lexiscan Myoview 5/16:  Apical thinning, EF not gated, no  ischemia. Low Risk  . Hyperlipidemia   . Major depression   . Mild cognitive impairment   . MVP (mitral valve prolapse)    S/P Rt mini thoractomy for Mitral Valve repair  . Nocturia   . Paroxysmal atrial fibrillation (HCC)    S/P Maze procedure  . 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 Prostate cancer ([place])   . 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 Shortness of breath dyspnea    with exertion  . Sleep disturbance   . Thoracic aortic aneurysm ([place])   . Urinary frequency   . Urinary urgency     Past Surgical History:  Procedure Laterality Date  . CARDIAC CATHETERIZATION    . CATARACT EXTRACTION W/ INTRAOCULAR LENS  IMPLANT, BILATERAL Bilateral   . COLECTOMY  2004   Dr [name]  . DIRECT LARYNGOSCOPY N/A 01/15/2016   Procedure: DIRECT LARYNGOSCOPY WITH BIOPSY AND FROZEN SECTION;  Surgeon: Izora Gala, MD;  Location: [place];  Service: ENT;  Laterality: N/A;  . FlexHD patch repair of chest wall hernia.  01/28/2011   [name]  . GASTROSTOMY W/ FEEDING TUBE    . IR GENERIC HISTORICAL  10/08/2016   IR GASTROSTOMY TUBE REMOVAL 10/08/2016 Sandi Mariscal, MD [facility]-INTERV RAD  . JOINT REPLACEMENT    . LARYNGETOMY N/A 01/15/2016   Procedure:  TOTAL LARYNGECTOMY;  Surgeon: Izora Gala, MD;  [facility] OR;  Service: ENT;  Laterality: N/A;  . MAZE  12/27/2008   left side lesion set  . MICROLARYNGOSCOPY Right 01/29/2015   Procedure: MICROLARYNGOSCOPY WITH BIOSPY OF RIGHT VOCAL CORD;  Surgeon: Izora Gala, MD;  Location: [place];  Service: ENT;  Laterality: Right;  . MITRAL VALVE REPAIR  12/27/2008   complex valvuloplasty with 25mm Memo 3D annuloplasty ring via right minithoracotomy  . PECTORALIS FLAP Left 05/04/2016   Procedure: PECTORALIS FLAP to neck with possible, split thickness skin graft;  Surgeon: Loel Lofty Dillingham, DO;  Location: [place];  Service: Plastics;  Laterality: Left;  . PROSTATE BIOPSY    . SKIN SPLIT GRAFT Left 05/04/2016    Procedure: PECTORALIS MAJOR MYOCUTANEOUS FLAP RECONSTRUCTION OF PHARYNX AND SPLIT THICKNESS SKIN GRAFT;  Surgeon: Izora Gala, MD;  Location: [place];  Service: ENT;  Laterality: Left;  . TEE WITHOUT CARDIOVERSION N/A 01/03/2013   Procedure: TRANSESOPHAGEAL ECHOCARDIOGRAM (TEE);  Surgeon: Sueanne Margarita, MD;  Location: [place];  Service: Cardiovascular;  Laterality: N/A;  . TOTAL KNEE ARTHROPLASTY Right 2003  . TRACHEAL ESOPHAGEAL PUNCTURE REPAIR N/A 03/26/2016   Procedure: TRACHEAL ESOPHAGEAL PUNCTURE;  Surgeon: Izora Gala, MD;  Location: [place];  Service: ENT;  Laterality: N/A;  . TRACHEOESOPHAGEAL FISTULA REPAIR N/A 03/26/2016   Procedure: TRACHEO-ESOPHAGEAL   PUNCTURE,FISTULAR CLOSURE;  Surgeon: Izora Gala, MD;  Location: [place];  Service: ENT;  Laterality: N/A;  . TRACHEOESOPHAGEAL FISTULA REPAIR N/A 04/09/2016   Procedure: FISTULA REPAIR WITH  MUSCLE ROTATION FLAP;  Surgeon: Izora Gala, MD;  Location: [place];  Service: ENT;  Laterality: N/A;  . TRACHEOESOPHAGEAL FISTULA REPAIR N/A 07/27/2016   Procedure: CLOSURE OF FISTULA;  Surgeon: Izora Gala, MD;  Location: [place];  Service: ENT;  Laterality: N/A;  . TYMPANOPLASTY  1967   "? side"    Family History  Problem Relation Age of Onset  . Cancer Mother        lymphoma  . Cancer Father        pancreatic  . Heart attack Neg Hx   . Stroke Neg Hx     Social History   Social History  . Marital status: Widowed    Spouse name: N/A  . Number of children: 2  . Years of education: 14   Occupational History  . retired Nutritional therapist     Social History Main Topics  . Smoking status: Never Smoker  . Smokeless tobacco: Never Used  . Alcohol use 0.0 oz/week     Comment: rare  . Drug use: No  . Sexual activity: No   Other Topics Concern  . 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 He has never used smokeless tobacco. He reports that he drinks alcohol. He reports that he does not use drugs.  Allergies  Allergen Reactions  . Citalopram Nausea Only  . Trazodone And Nefazodone Other (See Comments)    Dry mouth  . Zoloft [Sertraline Hcl] Other (See Comments)    dizzy    Allergies as of 03/01/2017      Reactions   Citalopram Nausea Only   Trazodone And Nefazodone Other (See Comments)   Dry mouth   Zoloft [sertraline Hcl] Other (See Comments)   dizzy      Medication List       Accurate as of 03/01/17  9:15 AM. Always use your most recent med list.          atorvastatin 40 MG tablet Commonly known as:  LIPITOR Take 40 mg by mouth daily.   Vitamin D 2000 units Caps Give 2,000 Units by tube daily.   zolpidem 5 MG tablet Commonly known as:  AMBIEN TAKE ONE TABLET BY MOUTH EVERY NIGHT AT BEDTIME IN FEEDING TUBE      Review of Systems:  Review of Systems  Constitutional: Negative for chills and fever.  HENT: Positive for hearing loss. Negative for congestion.        Has stoma with amplifier  Eyes: Positive for blurred vision.  Glasses  Respiratory: Negative for cough, sputum production and shortness of breath.   Cardiovascular: Positive for leg swelling. Negative for chest pain and palpitations.  Gastrointestinal: Negative for abdominal pain, blood in stool, constipation and melena.  Genitourinary: Negative for dysuria, frequency and urgency.  Musculoskeletal: Positive for joint pain.       Left knee s/p baker's cyst aspiration and injection with improvement, but chronic edema left greater than right  Skin: Negative for itching and rash.  Neurological: Negative for dizziness and loss of consciousness.  Endo/Heme/Allergies: Bruises/bleeds easily.  Psychiatric/Behavioral: Positive for memory loss. Negative for depression. The patient is not nervous/anxious.        Mild cognitive impairment    Physical  Exam: Vitals:   03/01/17 0850  BP: 130/60  Pulse: 71  Temp: 98 F (36.7 C)  TempSrc: Oral  SpO2: 96%  Weight: 184 lb (83.5 kg)   Body mass index is 25.66 kg/m. Physical Exam  Constitutional: He is oriented to person, place, and time. He appears well-developed and well-nourished. No distress.  HENT:  Head: Normocephalic and atraumatic.  Right Ear: External ear normal.  Left Ear: External ear normal.  Nose: Nose normal.  Mouth/Throat: Oropharynx is clear and moist. No oropharyngeal exudate.  Eyes: Pupils are equal, round, and reactive to light. Conjunctivae and EOM are normal.  Neck: Normal range of motion. No JVD present.  Stoma, has amplifier for talking  Cardiovascular: Normal rate, regular rhythm, normal heart sounds and intact distal pulses.   Edema left greater than right  Pulmonary/Chest: Effort normal and breath sounds normal. No respiratory distress.  Abdominal: Soft. Bowel sounds are normal. He exhibits no distension. There is no tenderness.  Musculoskeletal: Normal range of motion. He exhibits tenderness.  Left knee  Lymphadenopathy:    He has no cervical adenopathy.  Neurological: He is alert and oriented to person, place, and time.  Skin: Skin is warm and dry. Capillary refill takes less than 2 seconds.  Psychiatric: He has a normal mood and affect. His behavior is normal. Judgment and thought content normal.    Labs reviewed: Basic Metabolic Panel:  Recent Labs  07/27/16 1000 12/06/16 1048 12/06/16 1350 12/08/16 0238  NA 140 138  --  136  K 3.9 3.8  --  3.2*  CL 110 109  --  106  CO2 25 21*  --  23  GLUCOSE 93 88  --  112*  BUN 23* 22*  --  22*  CREATININE 1.03 0.88 0.87 1.04  CALCIUM 8.6* 7.8*  --  7.7*  MG  --  2.1  --   --   PHOS  --  2.9  --   --    Liver Function Tests:  Recent Labs  12/06/16 1048 12/08/16 0238  AST 21 17  ALT 19 18  ALKPHOS 64 68  BILITOT 1.3* 0.8  PROT 5.9* 5.9*  ALBUMIN 2.6* 2.4*   No results for input(s):  LIPASE, AMYLASE in the last 8760 hours. No results for input(s): AMMONIA in the last 8760 hours. CBC:  Recent Labs  12/06/16 0910 12/06/16 1350 12/08/16 0238  WBC 9.8 8.5 8.9  NEUTROABS 7.5  --   --   HGB 13.9 14.3 13.3  HCT 41.7 43.1 40.0  MCV 97.4 98.2 95.0  PLT 156 159 187   Lipid Panel:  Recent Labs  12/21/16 0949  CHOL 141  HDL 52  LDLCALC 77  TRIG 62  CHOLHDL 2.7   Lab Results  Component Value Date  HGBA1C  2020/10/808    5.3 (NOTE) The ADA recommends the following therapeutic goal for glycemic control related to Hgb A1c measurement: Goal of therapy: <6.5 Hgb A1c  Reference: American Diabetes Association: Clinical Practice Recommendations 2010, Diabetes Care, 2010, 33: (Suppl  1).    Procedures: US Aspiration  Result Date: 02/24/2017 INDICATION: Left popliteal fossa Baker's cyst EXAM: US ASPIRATION MEDICATIONS: The patient is currently admitted to the hospital and receiving intravenous antibiotics. The antibiotics were administered within an appropriate time frame prior to the initiation of the procedure. ANESTHESIA/SEDATION: None COMPLICATIONS: None immediate. TECHNIQUE: Informed written consent was obtained from the patient after a thorough discussion of the procedural risks, benefits and alternatives. All questions were addressed. Maximal Sterile Barrier Technique was utilized including caps, mask, sterile gowns, sterile gloves, sterile drape, hand hygiene and skin antiseptic. A timeout was performed prior to the initiation of the procedure. Survey ultrasound of the left popliteal fossa was performed and a medial posterior elongated fluid collection was identified. Overlying skin prepped with Betadine, draped in usual sterile fashion, infiltrated locally with 1% lidocaine. An 18-gauge needle was advanced into the collection under ultrasound guidance. Approximately 20 cc of clear straw-colored fluid were aspirated. One hundred twenty mg Depo-Medrol mixed with 3 cc 1%  lidocaine were then administered into the collection. Patient tolerated procedure and was discharged home in good condition. IMPRESSION: Technically successful aspiration and injection of left popliteal cyst. Electronically Signed   By: Marybelle Killings M.D.   On: 02/24/2017 08:45   Assessment/Plan 1. Annual physical exam -performed today, doing much better recently   2. Essential hypertension -bp at goal with current therapy, cont same regimen, no lows lately  3. Chronic systolic congestive heart failure (HCC) -not on meds, bp low, cont to monitor  4. Cyst, baker's knee, left -s/p aspiration, much improved pain, has some chronic mobility issues related to this  5. Mild cognitive impairment -cont to monitor, but doing quite well functionally in [place] apt at [place]  6. S/P laryngectomy -cont use of electrolarynx/voicebox product to help with speech--working quite well  8. Need for shingles vaccine -Rx sent to pharmacy with a refill for shingrix vaccine series  Labs/tests ordered:  No new Next appt:  4 mos med mgt  Hartlee Amedee L. Babs Dabbs, D.O. [facility] [address], [place] [address] Cell Phone (Mon-Fri 8am-5pm):  [phone] On Call:  [phone] & follow prompts after 5pm & weekends Office Phone:  [phone] Office Fax:  [phone]

## 2017-03-01 NOTE — Patient Instructions (Addendum)
Mr. Timothy Suarez , Thank you for taking time to come for your Medicare Wellness Visit. I appreciate your ongoing commitment to your health goals. Please review the following plan we discussed and let me know if I can assist you in the future.   Screening recommendations/referrals: Colonoscopy up to date, patient aged out Recommended yearly ophthalmology/optometry visit for glaucoma screening and checkup Recommended yearly dental visit for hygiene and checkup  Vaccinations: Influenza vaccine up to date. Due 06/19/2017 Pneumococcal vaccine up to date Tdap vaccine up to date. Due 08/11/2023 Shingles vaccine due. Call you pharmacy to see how much it will cost you, if it's something you would like to get we will send a prescription to your pharmacy. We recommend it to our patients over age 72 years. It's a two part vaccine where you wait 2-6 months inbetween the 2 shots.  Advanced directives: Please bring [place] a copy of your living will and health care power of attorney next time you come.  Conditions/risks identified: None  Next appointment: Timothy Suarez 03/01/17 @ 9am  Preventive Care 21 Years and Older, Male Preventive care refers to lifestyle choices and visits with your health care provider that can promote health and wellness. What does preventive care include?  A yearly physical exam. This is also called an annual well check.  Dental exams once or twice a year.  Routine eye exams. Ask your health care provider how often you should have your eyes checked.  Personal lifestyle choices, including:  Daily care of your teeth and gums.  Regular physical activity.  Eating a healthy diet.  Avoiding tobacco and drug use.  Limiting alcohol use.  Practicing safe sex.  Taking low doses of aspirin every day.  Taking vitamin and mineral supplements as recommended by your health care provider. What happens during an annual well check? The services and screenings done by your health care provider  during your annual well check will depend on your age, overall health, lifestyle risk factors, and family history of disease. Counseling  Your health care provider may ask you questions about your:  Alcohol use.  Tobacco use.  Drug use.  Emotional well-being.  Home and relationship well-being.  Sexual activity.  Eating habits.  History of falls.  Memory and ability to understand (cognition).  Work and work Statistician. Screening  You may have the following tests or measurements:  Height, weight, and BMI.  Blood pressure.  Lipid and cholesterol levels. These may be checked every 5 years, or more frequently if you are over 15 years old.  Skin check.  Lung cancer screening. You may have this screening every year starting at age 71 if you have a 30-pack-year history of smoking and currently smoke or have quit within the past 15 years.  Fecal occult blood test (FOBT) of the stool. You may have this test every year starting at age 53.  Flexible sigmoidoscopy or colonoscopy. You may have a sigmoidoscopy every 5 years or a colonoscopy every 10 years starting at age 35.  Prostate cancer screening. Recommendations will vary depending on your family history and other risks.  Hepatitis C blood test.  Hepatitis B blood test.  Sexually transmitted disease (STD) testing.  Diabetes screening. This is done by checking your blood sugar (glucose) after you have not eaten for a while (fasting). You may have this done every 1-3 years.  Abdominal aortic aneurysm (AAA) screening. You may need this if you are a current or former smoker.  Osteoporosis. You may be screened  starting at age 75 if you are at high risk. Talk with your health care provider about your test results, treatment options, and if necessary, the need for more tests. Vaccines  Your health care provider may recommend certain vaccines, such as:  Influenza vaccine. This is recommended every year.  Tetanus,  diphtheria, and acellular pertussis (Tdap, Td) vaccine. You may need a Td booster every 10 years.  Zoster vaccine. You may need this after age 19.  Pneumococcal 13-valent conjugate (PCV13) vaccine. One dose is recommended after age 49.  Pneumococcal polysaccharide (PPSV23) vaccine. One dose is recommended after age 35. Talk to your health care provider about which screenings and vaccines you need and how often you need them. This information is not intended to replace advice given to you by your health care provider. Make sure you discuss any questions you have with your health care provider. Document Released: 08/23/2015 Document Revised: 04/15/2016 Document Reviewed: 05/28/2015 Elsevier Interactive Patient Education  2017 [place] Prevention in the Home Falls can cause injuries. They can happen to people of all ages. There are many things you can do to make your home safe and to help prevent falls. What can I do on the outside of my home?  Regularly fix the edges of walkways and driveways and fix any cracks.  Remove anything that might make you trip as you walk through a door, such as a raised step or threshold.  Trim any bushes or trees on the path to your home.  Use bright outdoor lighting.  Clear any walking paths of anything that might make someone trip, such as rocks or tools.  Regularly check to see if handrails are loose or broken. Make sure that both sides of any steps have handrails.  Any raised decks and porches should have guardrails on the edges.  Have any leaves, snow, or ice cleared regularly.  Use sand or salt on walking paths during winter.  Clean up any spills in your garage right away. This includes oil or grease spills. What can I do in the bathroom?  Use night lights.  Install grab bars by the toilet and in the tub and shower. Do not use towel bars as grab bars.  Use non-skid mats or decals in the tub or shower.  If you need to sit down in  the shower, use a plastic, non-slip stool.  Keep the floor dry. Clean up any water that spills on the floor as soon as it happens.  Remove soap buildup in the tub or shower regularly.  Attach bath mats securely with double-sided non-slip rug tape.  Do not have throw rugs and other things on the floor that can make you trip. What can I do in the bedroom?  Use night lights.  Make sure that you have a light by your bed that is easy to reach.  Do not use any sheets or blankets that are too big for your bed. They should not hang down onto the floor.  Have a firm chair that has side arms. You can use this for support while you get dressed.  Do not have throw rugs and other things on the floor that can make you trip. What can I do in the kitchen?  Clean up any spills right away.  Avoid walking on wet floors.  Keep items that you use a lot in easy-to-reach places.  If you need to reach something above you, use a strong step stool that has a grab  bar.  Keep electrical cords out of the way.  Do not use floor polish or wax that makes floors slippery. If you must use wax, use non-skid floor wax.  Do not have throw rugs and other things on the floor that can make you trip. What can I do with my stairs?  Do not leave any items on the stairs.  Make sure that there are handrails on both sides of the stairs and use them. Fix handrails that are broken or loose. Make sure that handrails are as long as the stairways.  Check any carpeting to make sure that it is firmly attached to the stairs. Fix any carpet that is loose or worn.  Avoid having throw rugs at the top or bottom of the stairs. If you do have throw rugs, attach them to the floor with carpet tape.  Make sure that you have a light switch at the top of the stairs and the bottom of the stairs. If you do not have them, ask someone to add them for you. What else can I do to help prevent falls?  Wear shoes that:  Do not have high  heels.  Have rubber bottoms.  Are comfortable and fit you well.  Are closed at the toe. Do not wear sandals.  If you use a stepladder:  Make sure that it is fully opened. Do not climb a closed stepladder.  Make sure that both sides of the stepladder are locked into place.  Ask someone to hold it for you, if possible.  Clearly mark and make sure that you can see:  Any grab bars or handrails.  First and last steps.  Where the edge of each step is.  Use tools that help you move around (mobility aids) if they are needed. These include:  Canes.  Walkers.  Scooters.  Crutches.  Turn on the lights when you go into a dark area. Replace any light bulbs as soon as they burn out.  Set up your furniture so you have a clear path. Avoid moving your furniture around.  If any of your floors are uneven, fix them.  If there are any pets around you, be aware of where they are.  Review your medicines with your doctor. Some medicines can make you feel dizzy. This can increase your chance of falling. Ask your doctor what other things that you can do to help prevent falls. This information is not intended to replace advice given to you by your health care provider. Make sure you discuss any questions you have with your health care provider. Document Released: 05/23/2009 Document Revised: 01/02/2016 Document Reviewed: 08/31/2014 Elsevier Interactive Patient Education  2017 [employer].

## 2017-03-01 NOTE — Progress Notes (Signed)
Subjective:   Timothy Suarez is a 83 y.o. male who presents for an Initial Medicare Annual Wellness Visit.    Objective:    Today's Vitals   03/01/17 0839  BP: 130/60  Pulse: 68  Temp: 98 F (36.7 C)  TempSrc: Oral  SpO2: 96%  Weight: 184 lb (83.5 kg)  Height: 5\' 11"  (1.803 m)   Body mass index is 25.66 kg/m.  Current Medications (verified) Outpatient Encounter Prescriptions as of 03/01/2017  Medication Sig  . atorvastatin (LIPITOR) 40 MG tablet OVERDUE for cholesterol labs, 1 tablet by mouth daily at 6 pm for cholesterol  . Cholecalciferol (VITAMIN D) 2000 units CAPS Give 2,000 Units by tube daily.   Marland Kitchen zolpidem (AMBIEN) 5 MG tablet TAKE ONE TABLET BY MOUTH EVERY NIGHT AT BEDTIME IN FEEDING TUBE   No facility-administered encounter medications on file as of 03/01/2017.     Allergies (verified) Citalopram; Trazodone and nefazodone; and Zoloft [sertraline hcl]   History: Past Medical History:  Diagnosis Date  . A-fib ([place])   . Aortic valve insufficiency, acquired   . Arthritis   . BPH (benign prostatic hyperplasia)   . Cerebral ischemia   . Colon cancer [facility])     s/p partial colectomy  . Complication of anesthesia    "woke up w/confusion and hallucinations once after mitral valve OR"  . Coronary artery disease    50-70% LAD mitral valve repair 4+ yrs ago  . Depression   . Dyslipidemia   . Glaucoma   . Heart murmur   . Hernia   . High cholesterol   . History of echocardiogram    Echo 5/16:  Mild LVH, EF 50-55%, Gr 1 DD, septal HK, Ao sclerosis without stenosis, mild AI, MV repair ok with borderline mild MS (mean 4 mmHg), mild MR, mild LAE, mod RAE, mild TR, PASP 30 mmHg  . HTN (hypertension)    takes Amlodipine daily  . Hx of cardiovascular stress test    Lexiscan Myoview 5/16:  Apical thinning, EF not gated, no ischemia. Low Risk  . Hyperlipidemia   . Major depression   . Mild cognitive impairment   . MVP (mitral valve prolapse)    S/P Rt mini  thoractomy for Mitral Valve repair  . Nocturia   . Paroxysmal atrial fibrillation (HCC)    S/P Maze procedure  . 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 Prostate cancer ([place])   . 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 Shortness of breath dyspnea    with exertion  . Sleep disturbance   . Thoracic aortic aneurysm ([place])   . Urinary frequency   . Urinary urgency    Past Surgical History:  Procedure Laterality Date  . CARDIAC CATHETERIZATION    . CATARACT EXTRACTION W/ INTRAOCULAR LENS  IMPLANT, BILATERAL Bilateral   . COLECTOMY  2004   Dr [name]  . DIRECT LARYNGOSCOPY N/A 01/15/2016   Procedure: DIRECT LARYNGOSCOPY WITH BIOPSY AND FROZEN SECTION;  Surgeon: Izora Gala, MD;  Location: [place];  Service: ENT;  Laterality: N/A;  . FlexHD patch repair of chest wall hernia.  01/28/2011   [name]  . GASTROSTOMY W/ FEEDING TUBE    . IR GENERIC HISTORICAL  10/08/2016   IR GASTROSTOMY TUBE REMOVAL 10/08/2016 Sandi Mariscal, MD [facility]-INTERV RAD  . JOINT REPLACEMENT    . LARYNGETOMY N/A 01/15/2016   Procedure:  TOTAL LARYNGECTOMY;  Surgeon: Izora Gala, MD;  Location: [place] OR;  Service: ENT;  Laterality: N/A;  . MAZE  12/27/2008   left side lesion set  . MICROLARYNGOSCOPY Right 01/29/2015   Procedure: MICROLARYNGOSCOPY WITH BIOSPY OF RIGHT VOCAL CORD;  Surgeon: Izora Gala, MD;  Location: [place];  Service: ENT;  Laterality: Right;  . MITRAL VALVE REPAIR  12/27/2008   complex valvuloplasty with 59mm Memo 3D annuloplasty ring via right minithoracotomy  . PECTORALIS FLAP Left 05/04/2016   Procedure: PECTORALIS FLAP to neck with possible, split thickness skin graft;  Surgeon: Loel Lofty Dillingham, DO;  Location: [place];  Service: Plastics;  Laterality: Left;  . PROSTATE BIOPSY    . SKIN SPLIT GRAFT Left 05/04/2016   Procedure: PECTORALIS MAJOR MYOCUTANEOUS FLAP RECONSTRUCTION OF PHARYNX AND SPLIT THICKNESS SKIN GRAFT;  Surgeon: Izora Gala, MD;   Location: [place];  Service: ENT;  Laterality: Left;  . TEE WITHOUT CARDIOVERSION N/A 01/03/2013   Procedure: TRANSESOPHAGEAL ECHOCARDIOGRAM (TEE);  Surgeon: Sueanne Margarita, MD;  Location: [place];  Service: Cardiovascular;  Laterality: N/A;  . TOTAL KNEE ARTHROPLASTY Right 2003  . TRACHEAL ESOPHAGEAL PUNCTURE REPAIR N/A 03/26/2016   Procedure: TRACHEAL ESOPHAGEAL PUNCTURE;  Surgeon: Izora Gala, MD;  Location: [place];  Service: ENT;  Laterality: N/A;  . TRACHEOESOPHAGEAL FISTULA REPAIR N/A 03/26/2016   Procedure: TRACHEO-ESOPHAGEAL   PUNCTURE,FISTULAR CLOSURE;  Surgeon: Izora Gala, MD;  Location: [place];  Service: ENT;  Laterality: N/A;  . TRACHEOESOPHAGEAL FISTULA REPAIR N/A 04/09/2016   Procedure: FISTULA REPAIR WITH  MUSCLE ROTATION FLAP;  Surgeon: Izora Gala, MD;  Location: [place];  Service: ENT;  Laterality: N/A;  . TRACHEOESOPHAGEAL FISTULA REPAIR N/A 07/27/2016   Procedure: CLOSURE OF FISTULA;  Surgeon: Izora Gala, MD;  Location: [place];  Service: ENT;  Laterality: N/A;  . TYMPANOPLASTY  1967   "? side"   Family History  Problem Relation Age of Onset  . Cancer Mother        lymphoma  . Cancer Father        pancreatic  . Heart attack Neg Hx   . Stroke Neg Hx    Social History   Occupational History  . retired Nutritional therapist     Social History Main Topics  . Smoking status: Never Smoker  . Smokeless tobacco: Never Used  . Alcohol use 0.0 oz/week     Comment: rare  . Drug use: No  . Sexual activity: No   Tobacco Counseling Counseling given: Not Answered   Activities of Daily Living In your present state of health, do you have any difficulty performing the following activities: 03/01/2017 12/07/2016  Hearing? Tempie Donning  Vision? N N  Difficulty concentrating or making decisions? Y N  Walking or climbing stairs? Y Y  Dressing or bathing? N N  Doing errands, shopping? N Y  Conservation officer, nature and eating ? N -  Using the Toilet? N -  In the past six months, have you accidently leaked  urine? N -  Do you have problems with loss of bowel control? N -  Managing your Medications? N -  Managing your Finances? N -  Housekeeping or managing your Housekeeping? N -  Some recent data might be hidden    Immunizations and Health Maintenance Immunization History  Administered Date(s) Administered  . Influenza,inj,Quad PF,36+ Mos 05/02/2015, 06/19/2016  . Influenza-Unspecified 05/10/2013, 06/11/2014  . Pneumococcal Conjugate-13 06/11/2014  . Pneumococcal Polysaccharide-23 08/10/2013  . Td 08/10/2013   There are no preventive care reminders to display for this patient.  Patient Care Team: Gayland Curry, DO as PCP - General (Geriatric Medicine) Sueanne Margarita, MD as Consulting Physician (Cardiology) Tyler Pita, MD as Consulting Physician (Radiation Oncology) Izora Gala, MD as Consulting Physician (Otolaryngology)  Indicate any recent Medical Services you may have received from other than [facility] providers in the past year (date may be approximate).    Assessment:   This is a routine wellness examination for Timothy Suarez.   Hearing/Vision screen No exam data present  Dietary issues and exercise activities discussed: Current Exercise Habits: The patient does not participate in regular exercise at present, Exercise limited by: None identified  Goals    . Increase water intake          Patient will drink more water.      Depression Screen PHQ 2/9 Scores 03/01/2017 09/21/2016 07/30/2016 12/09/2015  PHQ - 2 Score 0 0 0 0  PHQ- 9 Score - - - -    Fall Risk Fall Risk  03/01/2017 12/21/2016 09/21/2016 07/30/2016 06/19/2016  Falls in the past year? No No No No No  Number falls in past yr: - - - - -  Injury with Fall? - - - - -  Risk for fall due to : - - - - -  Follow up - - - - -    Cognitive Function: MMSE - Mini Mental State Exam 03/01/2017 12/09/2015 11/09/2014 04/02/2014  Orientation to time 5 3 5 4   Orientation to Place 5 4 5 5   Registration 3 3 3 3   Attention/  Calculation 5 4 5 3   Recall 0 1 1 1   Language- name 2 objects 2 2 2 2   Language- repeat 1 1 1 1   Language- follow 3 step command 3 3 3 3   Language- read & follow direction 1 1 1 1   Write a sentence 1 1 1 1   Copy design 0 1 1 1   Total score 26 24 28 25         Screening Tests Health Maintenance  Topic Date Due  . INFLUENZA VACCINE  03/10/2017  . TETANUS/TDAP  08/11/2023  . PNA vac Low Risk Adult  Completed        Plan:    I have personally reviewed and addressed the Medicare Annual Wellness questionnaire and have noted the following in the patient's chart:  A. Medical and social history B. Use of alcohol, tobacco or illicit drugs  C. Current medications and supplements D. Functional ability and status E.  Nutritional status F.  Physical activity G. Advance directives H. List of other physicians I.  Hospitalizations, surgeries, and ER visits in previous 12 months J.  [place] to include hearing, vision, cognitive, depression L. Referrals and appointments - none  In addition, I have reviewed and discussed with patient certain preventive protocols, quality metrics, and best practice recommendations. A written personalized care plan for preventive services as well as general preventive health recommendations were provided to patient.  See attached scanned questionnaire for additional information.   Signed,   Rich Reining, RN Nurse Health Advisor   Quick Notes   Health Maintenance: Up to date. Pt would like more education on the shingles vaccine.      Abnormal Screen: MMSE 26/30. Did not pass clock drawing     Patient Concerns: None      Nurse Concerns: None

## 2017-03-23 ENCOUNTER — Encounter: Payer: Self-pay | Admitting: Nurse Practitioner

## 2017-03-23 ENCOUNTER — Ambulatory Visit (INDEPENDENT_AMBULATORY_CARE_PROVIDER_SITE_OTHER): Payer: Medicare Other | Admitting: Nurse Practitioner

## 2017-03-23 VITALS — BP 132/74 | HR 65 | Temp 97.5°F | Resp 17 | Ht 71.0 in | Wt 187.8 lb

## 2017-03-23 DIAGNOSIS — R0602 Shortness of breath: Secondary | ICD-10-CM

## 2017-03-23 DIAGNOSIS — R531 Weakness: Secondary | ICD-10-CM | POA: Diagnosis not present

## 2017-03-23 DIAGNOSIS — I5022 Chronic systolic (congestive) heart failure: Secondary | ICD-10-CM

## 2017-03-23 DIAGNOSIS — E43 Unspecified severe protein-calorie malnutrition: Secondary | ICD-10-CM

## 2017-03-23 LAB — CBC WITH DIFFERENTIAL/PLATELET
BASOS PCT: 0 %
Basophils Absolute: 0 cells/uL (ref 0–200)
EOS ABS: 138 {cells}/uL (ref 15–500)
Eosinophils Relative: 2 %
HEMATOCRIT: 44.1 % (ref 38.5–50.0)
HEMOGLOBIN: 14.7 g/dL (ref 13.2–17.1)
Lymphocytes Relative: 22 %
Lymphs Abs: 1518 cells/uL (ref 850–3900)
MCH: 32.3 pg (ref 27.0–33.0)
MCHC: 33.3 g/dL (ref 32.0–36.0)
MCV: 96.9 fL (ref 80.0–100.0)
MONOS PCT: 8 %
MPV: 10.8 fL (ref 7.5–12.5)
Monocytes Absolute: 552 cells/uL (ref 200–950)
NEUTROS ABS: 4692 {cells}/uL (ref 1500–7800)
Neutrophils Relative %: 68 %
PLATELETS: 141 10*3/uL (ref 140–400)
RBC: 4.55 MIL/uL (ref 4.20–5.80)
RDW: 14.1 % (ref 11.0–15.0)
WBC: 6.9 10*3/uL (ref 3.8–10.8)

## 2017-03-23 LAB — TSH: TSH: 7.31 mIU/L — ABNORMAL HIGH (ref 0.40–4.50)

## 2017-03-23 NOTE — Progress Notes (Signed)
Careteam: Patient Care Team: Gayland Curry, DO as PCP - General (Geriatric Medicine) Sueanne Margarita, MD as Consulting Physician (Cardiology) Tyler Pita, MD as Consulting Physician (Radiation Oncology) Izora Gala, MD as Consulting Physician (Otolaryngology)    Allergies  Allergen Reactions  . Citalopram Nausea Only  . Trazodone And Nefazodone Other (See Comments)    Dry mouth  . Zoloft [Sertraline Hcl] Other (See Comments)    dizzy    Chief Complaint  Patient presents with  . Acute Visit    Pt is being seen due to on going SOB, weakness, and dizziness x 1 month. Pt would like to have oxygen to use at home.   . Other    Daughter, Jackelyn Poling, in room     HPI: Patient is a 83 y.o. male seen in the office today due to ongoing shortness of breath for 1 month with weakness. Has had a lot surgeries over the last year so been through a lot. Hx of laryngeal cancer s/p laryngectomy, cognitive impairment, CHF, htn and others. Weight of 187 lbs up from 184 lbs in July.   Reports a lot of trouble in the last month. Not sleeping, not breathing well with movement and increase weakness that has been ongoing for a while but seems to be worse in the last month. No cough or chest congestion. Has some congestion in stoma but this is minimal.  At [employer] and was doing the exercise class but started getting weak and therefore couldn't continue.  Shortness of breath mostly with increase activity but happens when he is sitting occasionally too.  Weakness to bilateral legs, weak all over.  Dizziness or fuzzy headed is not new and daughter states cardiologist placed him through a lot of test when this originally started. Daughter states this complaint comes when he has time to think about it. When he has other stuff going on he normally does not complain about dizziness .  Ankle and legs swollen which is chronic but currently a little more than normal.  Eats at [employer] twice  daily.  No dark stools or bleeding.  No changes in urination- hx of prostate Ca that is not being treated. No fevers or chills.  Trouble with laying down at night, sometimes will get more short of breath and need to sit up which helps daughter thinks this is due to his stoma and positioning more than anything.   Review of Systems:  Review of Systems  Constitutional: Negative for chills and fever.  HENT: Positive for hearing loss. Negative for congestion.        Has stoma with amplifier  Eyes: Positive for blurred vision.       Glasses  Respiratory: Positive for shortness of breath. Negative for cough, sputum production and wheezing.   Cardiovascular: Positive for leg swelling. Negative for chest pain, palpitations and PND.  Gastrointestinal: Negative for abdominal pain, blood in stool, constipation and melena.  Genitourinary: Negative for dysuria, frequency and urgency.  Musculoskeletal: Positive for joint pain.       Left knee s/p baker's cyst aspiration and injection with improvement, but chronic edema left greater than right  Skin: Negative for itching and rash.  Neurological: Negative for dizziness and loss of consciousness.  Endo/Heme/Allergies: Bruises/bleeds easily.  Psychiatric/Behavioral: Positive for memory loss. Negative for depression. The patient is not nervous/anxious.        Mild cognitive impairment    Past Medical History:  Diagnosis Date  . A-fib ([place])   .  Aortic valve insufficiency, acquired   . Arthritis   . BPH (benign prostatic hyperplasia)   . Cerebral ischemia   . Colon cancer [facility])     s/p partial colectomy  . Complication of anesthesia    "woke up w/confusion and hallucinations once after mitral valve OR"  . Coronary artery disease    50-70% LAD mitral valve repair 4+ yrs ago  . Depression   . Dyslipidemia   . Glaucoma   . Heart murmur   . Hernia   . High cholesterol   . History of echocardiogram    Echo 5/16:  Mild LVH, EF 50-55%, Gr 1 DD,  septal HK, Ao sclerosis without stenosis, mild AI, MV repair ok with borderline mild MS (mean 4 mmHg), mild MR, mild LAE, mod RAE, mild TR, PASP 30 mmHg  . HTN (hypertension)    takes Amlodipine daily  . Hx of cardiovascular stress test    Lexiscan Myoview 5/16:  Apical thinning, EF not gated, no ischemia. Low Risk  . Hyperlipidemia   . Major depression   . Mild cognitive impairment   . MVP (mitral valve prolapse)    S/P Rt mini thoractomy for Mitral Valve repair  . Nocturia   . Paroxysmal atrial fibrillation (HCC)    S/P Maze procedure  . 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 Prostate cancer ([place])   . 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 Shortness of breath dyspnea    with exertion  . Sleep disturbance   . Thoracic aortic aneurysm ([place])   . Urinary frequency   . Urinary urgency    Past Surgical History:  Procedure Laterality Date  . CARDIAC CATHETERIZATION    . CATARACT EXTRACTION W/ INTRAOCULAR LENS  IMPLANT, BILATERAL Bilateral   . COLECTOMY  2004   Dr [name]  . DIRECT LARYNGOSCOPY N/A 01/15/2016   Procedure: DIRECT LARYNGOSCOPY WITH BIOPSY AND FROZEN SECTION;  Surgeon: Izora Gala, MD;  Location: [place];  Service: ENT;  Laterality: N/A;  . FlexHD patch repair of chest wall hernia.  01/28/2011   [name]  . GASTROSTOMY W/ FEEDING TUBE    . IR GENERIC HISTORICAL  10/08/2016   IR GASTROSTOMY TUBE REMOVAL 10/08/2016 Sandi Mariscal, MD [facility]-INTERV RAD  . JOINT REPLACEMENT    . LARYNGETOMY N/A 01/15/2016   Procedure:  TOTAL LARYNGECTOMY;  Surgeon: Izora Gala, MD;  Location: [facility] OR;  Service: ENT;  Laterality: N/A;  . MAZE  12/27/2008   left side lesion set  . MICROLARYNGOSCOPY Right 01/29/2015   Procedure: MICROLARYNGOSCOPY WITH BIOSPY OF RIGHT VOCAL CORD;  Surgeon: Izora Gala, MD;  Location: [place];  Service: ENT;  Laterality: Right;  . MITRAL VALVE REPAIR  12/27/2008   complex valvuloplasty with 13m Memo 3D  annuloplasty ring via right minithoracotomy  . PECTORALIS FLAP Left 05/04/2016   Procedure: PECTORALIS FLAP to neck with possible, split thickness skin graft;  Surgeon: CLoel LoftyDillingham, DO;  [place]  Service: Plastics;  Laterality: Left;  . PROSTATE BIOPSY    . SKIN SPLIT GRAFT Left 05/04/2016   Procedure: PECTORALIS MAJOR MYOCUTANEOUS FLAP RECONSTRUCTION OF PHARYNX AND SPLIT THICKNESS SKIN GRAFT;  Surgeon: JIzora Gala MD;  [place]  Service: ENT;  Laterality: Left;  . TEE WITHOUT CARDIOVERSION N/A 01/03/2013   Procedure: TRANSESOPHAGEAL ECHOCARDIOGRAM (TEE);  Surgeon: TSueanne Margarita MD;  [facility]  Service: Cardiovascular;  Laterality: N/A;  . TOTAL  KNEE ARTHROPLASTY Right 2003  . TRACHEAL ESOPHAGEAL PUNCTURE REPAIR N/A 03/26/2016   Procedure: TRACHEAL ESOPHAGEAL PUNCTURE;  Surgeon: Izora Gala, MD;  Location: [facility] OR;  Service: ENT;  Laterality: N/A;  . TRACHEOESOPHAGEAL FISTULA REPAIR N/A 03/26/2016   Procedure: TRACHEO-ESOPHAGEAL   PUNCTURE,FISTULAR CLOSURE;  Surgeon: Izora Gala, MD;  Location: [place];  Service: ENT;  Laterality: N/A;  . TRACHEOESOPHAGEAL FISTULA REPAIR N/A 04/09/2016   Procedure: FISTULA REPAIR WITH  MUSCLE ROTATION FLAP;  Surgeon: Izora Gala, MD;  Location: [place];  Service: ENT;  Laterality: N/A;  . TRACHEOESOPHAGEAL FISTULA REPAIR N/A 07/27/2016   Procedure: CLOSURE OF FISTULA;  Surgeon: Izora Gala, MD;  Location: [place];  Service: ENT;  Laterality: N/A;  . TYMPANOPLASTY  1967   "? side"   Social History:   reports that he has never smoked. He has never used smokeless tobacco. He reports that he drinks alcohol. He reports that he does not use drugs.  Family History  Problem Relation Age of Onset  . Cancer Mother        lymphoma  . Cancer Father        pancreatic  . Heart attack Neg Hx   . Stroke Neg Hx     Medications: Patient's Medications  New Prescriptions   No medications on file  Previous Medications   ATORVASTATIN  (LIPITOR) 40 MG TABLET    Take 40 mg by mouth daily.   CHOLECALCIFEROL (VITAMIN D) 2000 UNITS CAPS    Give 2,000 Units by tube daily.    ZOLPIDEM (AMBIEN) 5 MG TABLET    TAKE ONE TABLET BY MOUTH EVERY NIGHT AT BEDTIME  Modified Medications   No medications on file  Discontinued Medications   No medications on file     Physical Exam:  Vitals:   03/23/17 1335  BP: 132/74  Pulse: 65  Resp: 17  Temp: (!) 97.5 F (36.4 C)  TempSrc: Oral  SpO2: 98%  Weight: 187 lb 12.8 oz (85.2 kg)  Height: '5\' 11"'$  (1.803 m)   Body mass index is 26.19 kg/m.  Physical Exam  Constitutional: He is oriented to person, place, and time. He appears well-developed and well-nourished. No distress.  HENT:  Head: Normocephalic and atraumatic.  Right Ear: External ear normal.  Left Ear: External ear normal.  Nose: Nose normal.  Mouth/Throat: Oropharynx is clear and moist. No oropharyngeal exudate.  Eyes: Pupils are equal, round, and reactive to light. Conjunctivae and EOM are normal.  Neck: Normal range of motion. No JVD present.  Stoma, has amplifier for talking  Cardiovascular: Normal rate, regular rhythm, normal heart sounds and intact distal pulses.   Edema left greater than right  Pulmonary/Chest: Effort normal and breath sounds normal. No respiratory distress. He has no wheezes. He has no rales. He exhibits no tenderness.  Abdominal: Soft. Bowel sounds are normal. He exhibits no distension. There is no tenderness.  Musculoskeletal: Normal range of motion. He exhibits edema (2-3+ bilaterally). He exhibits no tenderness.  Lymphadenopathy:    He has no cervical adenopathy.  Neurological: He is alert and oriented to person, place, and time.  Skin: Skin is warm and dry. Capillary refill takes less than 2 seconds.  Psychiatric: He has a normal mood and affect. His behavior is normal. Judgment and thought content normal.    Labs reviewed: Basic Metabolic Panel:  Recent Labs  07/27/16 1000  12/06/16 1048 12/06/16 1350 12/08/16 0238  NA 140 138  --  136  K 3.9 3.8  --  3.2*  CL 110 109  --  106  CO2 25 21*  --  23  GLUCOSE 93 88  --  112*  BUN 23* 22*  --  22*  CREATININE 1.03 0.88 0.87 1.04  CALCIUM 8.6* 7.8*  --  7.7*  MG  --  2.1  --   --   PHOS  --  2.9  --   --    Liver Function Tests:  Recent Labs  12/06/16 1048 12/08/16 0238  AST 21 17  ALT 19 18  ALKPHOS 64 68  BILITOT 1.3* 0.8  PROT 5.9* 5.9*  ALBUMIN 2.6* 2.4*   No results for input(s): LIPASE, AMYLASE in the last 8760 hours. No results for input(s): AMMONIA in the last 8760 hours. CBC:  Recent Labs  12/06/16 0910 12/06/16 1350 12/08/16 0238  WBC 9.8 8.5 8.9  NEUTROABS 7.5  --   --   HGB 13.9 14.3 13.3  HCT 41.7 43.1 40.0  MCV 97.4 98.2 95.0  PLT 156 159 187   Lipid Panel:  Recent Labs  12/21/16 0949  CHOL 141  HDL 52  LDLCALC 77  TRIG 62  CHOLHDL 2.7   TSH: No results for input(s): TSH in the last 8760 hours. A1C: Lab Results  Component Value Date   HGBA1C  02-15-202010    5.3 (NOTE) The ADA recommends the following therapeutic goal for glycemic control related to Hgb A1c measurement: Goal of therapy: <6.5 Hgb A1c  Reference: American Diabetes Association: Clinical Practice Recommendations 2010, Diabetes Care, 2010, 33: (Suppl  1).     Assessment/Plan 1. Chronic systolic congestive heart failure ([place]) From may 2018: Study Conclusions  - Left ventricle: The cavity size was mildly dilated. Wall   thickness was normal. Systolic function was severely reduced. The   estimated ejection fraction was in the range of 25% to 30%.   Diffuse hypokinesis. - Aortic valve: There was mild regurgitation. - Aortic root: The aortic root was mildly dilated. - Mitral valve: Prior procedures included surgical repair. There   was mild regurgitation. - Left atrium: The atrium was mildly dilated. -shortness of breath may be cardiac related but O2 sats remained normal, some increase in  edema which could be from malnutrition, dependant edema or CHF Will get chest xray and BNP for further evaluation  - Brain Natriuretic Peptide  2. Protein-calorie malnutrition, severe -protein and albumin low during last labs and hospitalization. Encouraged to increase protein intake, may use supplements if needed.   - CMP with eGFR  3. Weakness -progressive weakness, will get labs today, may be related to debility from multiple procedures and co-morbities. May benefit from PT - CBC with Differential/Platelets - TSH  4. Shortness of breath Worse on exertion however walks hallway in office and O2 sats remain 94% or over. Will get BNP, BMP, CBC, TSH at this time - DG Chest 2 View  Next appt: as scheduled and PRN. Will contact pts daughter with labs, xray and recommendations after results are in.  Carlos American. Harle Battiest  [facility] [phone] 8 am - 5 pm) [phone] (after hours)

## 2017-03-24 ENCOUNTER — Ambulatory Visit
Admission: RE | Admit: 2017-03-24 | Discharge: 2017-03-24 | Disposition: A | Discharge status: Home / Self Care | Payer: Medicare Other | Source: Ambulatory Visit | Attending: Nurse Practitioner | Admitting: Nurse Practitioner

## 2017-03-24 ENCOUNTER — Other Ambulatory Visit: Payer: Self-pay | Admitting: *Deleted

## 2017-03-24 DIAGNOSIS — I509 Heart failure, unspecified: Secondary | ICD-10-CM | POA: Diagnosis not present

## 2017-03-24 LAB — COMPLETE METABOLIC PANEL WITH GFR
ALBUMIN: 3.7 g/dL (ref 3.6–5.1)
ALK PHOS: 94 U/L (ref 40–115)
ALT: 15 U/L (ref 9–46)
AST: 18 U/L (ref 10–35)
BILIRUBIN TOTAL: 1.1 mg/dL (ref 0.2–1.2)
BUN: 22 mg/dL (ref 7–25)
CO2: 21 mmol/L (ref 20–32)
CREATININE: 1.43 mg/dL — AB (ref 0.70–1.11)
Calcium: 8.8 mg/dL (ref 8.6–10.3)
Chloride: 107 mmol/L (ref 98–110)
GFR, EST NON AFRICAN AMERICAN: 43 mL/min — AB (ref >=60)
GFR, Est African American: 49 mL/min — ABNORMAL LOW (ref >=60)
GLUCOSE: 97 mg/dL (ref 65–99)
Potassium: 5.3 mmol/L (ref 3.5–5.3)
SODIUM: 140 mmol/L (ref 135–146)
TOTAL PROTEIN: 6.2 g/dL (ref 6.1–8.1)

## 2017-03-24 LAB — BRAIN NATRIURETIC PEPTIDE: BRAIN NATRIURETIC PEPTIDE: 2357.2 pg/mL — AB (ref <100)

## 2017-03-24 MED ORDER — FUROSEMIDE 40 MG PO TABS: 40.0000 mg | ORAL_TABLET | Freq: Every day | ORAL | 0 refills | Status: DC

## 2017-03-24 NOTE — Progress Notes (Signed)
Per Jessica---See Lab Results.

## 2017-03-25 DIAGNOSIS — C320 Malignant neoplasm of glottis: Secondary | ICD-10-CM | POA: Diagnosis not present

## 2017-03-25 DIAGNOSIS — Z9002 Acquired absence of larynx: Secondary | ICD-10-CM | POA: Diagnosis not present

## 2017-03-29 ENCOUNTER — Ambulatory Visit (INDEPENDENT_AMBULATORY_CARE_PROVIDER_SITE_OTHER): Payer: Medicare Other | Admitting: Nurse Practitioner

## 2017-03-29 ENCOUNTER — Encounter: Payer: Self-pay | Admitting: Nurse Practitioner

## 2017-03-29 VITALS — BP 144/84 | HR 73 | Temp 97.7°F | Resp 17 | Ht 71.0 in | Wt 183.6 lb

## 2017-03-29 DIAGNOSIS — R0602 Shortness of breath: Secondary | ICD-10-CM

## 2017-03-29 DIAGNOSIS — I5022 Chronic systolic (congestive) heart failure: Secondary | ICD-10-CM | POA: Diagnosis not present

## 2017-03-29 DIAGNOSIS — R531 Weakness: Secondary | ICD-10-CM | POA: Diagnosis not present

## 2017-03-29 DIAGNOSIS — I499 Cardiac arrhythmia, unspecified: Secondary | ICD-10-CM

## 2017-03-29 DIAGNOSIS — E43 Unspecified severe protein-calorie malnutrition: Secondary | ICD-10-CM

## 2017-03-29 NOTE — Progress Notes (Signed)
Careteam: Patient Care Team: Gayland Curry, DO as PCP - General (Geriatric Medicine) Sueanne Margarita, MD as Consulting Physician (Cardiology) Tyler Pita, MD as Consulting Physician (Radiation Oncology) Izora Gala, MD as Consulting Physician (Otolaryngology)  Advanced Directive information    Allergies  Allergen Reactions  . Citalopram Nausea Only  . Trazodone And Nefazodone Other (See Comments)    Dry mouth  . Zoloft [Sertraline Hcl] Other (See Comments)    dizzy    Chief Complaint  Patient presents with  . Follow-up    Pt is being seen to follow up on SOB and CHF.   Marland Kitchen Other    Pt daughter is in room.     HPI: Patient is a 83 y.o. male seen in the office today due to shortness of breath.  Pt was started on lasix 40 mg daily 5 days ago. Daughter states that today he is feeling a lot better. He has slowly started to improve since starting lasix.  No increase in dizziness or light headedness Walking is easier.  Still very weak but this is improving. Daughter states he has therapy through facility if needed but would like to wait for now Still with LE edema. Sits with legs dependant a lot of the time.   Review of Systems:  Review of Systems  Constitutional: Negative for chills and fever.  HENT: Positive for hearing loss. Negative for congestion.        Has stoma with amplifier  Eyes:       Glasses  Respiratory: Positive for shortness of breath (improved). Negative for cough, sputum production and wheezing.   Cardiovascular: Positive for leg swelling. Negative for chest pain, palpitations and PND.  Gastrointestinal: Negative for abdominal pain, blood in stool, constipation and melena.  Musculoskeletal: Positive for joint pain.  Skin: Negative for itching and rash.  Neurological: Negative for dizziness and loss of consciousness.  Endo/Heme/Allergies: Bruises/bleeds easily.  Psychiatric/Behavioral: Positive for memory loss. Negative for depression. The  patient is not nervous/anxious.        Mild cognitive impairment    Past Medical History:  Diagnosis Date  . A-fib ([place])   . Aortic valve insufficiency, acquired   . Arthritis   . BPH (benign prostatic hyperplasia)   . Cerebral ischemia   . Colon cancer [facility])     s/p partial colectomy  . Complication of anesthesia    "woke up w/confusion and hallucinations once after mitral valve OR"  . Coronary artery disease    50-70% LAD mitral valve repair 4+ yrs ago  . Depression   . Dyslipidemia   . Glaucoma   . Heart murmur   . Hernia   . High cholesterol   . History of echocardiogram    Echo 5/16:  Mild LVH, EF 50-55%, Gr 1 DD, septal HK, Ao sclerosis without stenosis, mild AI, MV repair ok with borderline mild MS (mean 4 mmHg), mild MR, mild LAE, mod RAE, mild TR, PASP 30 mmHg  . HTN (hypertension)    takes Amlodipine daily  . Hx of cardiovascular stress test    Lexiscan Myoview 5/16:  Apical thinning, EF not gated, no ischemia. Low Risk  . Hyperlipidemia   . Major depression   . Mild cognitive impairment   . MVP (mitral valve prolapse)    S/P Rt mini thoractomy for Mitral Valve repair  . Nocturia   . Paroxysmal atrial fibrillation (HCC)    S/P Maze procedure  . Pharyngocutaneous fistula hospitalized 02/21/2016  s/p salvage laryngectomy  . Prostate cancer ([place])   . 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 Shortness of breath dyspnea    with exertion  . Sleep disturbance   . Thoracic aortic aneurysm ([place])   . Urinary frequency   . Urinary urgency    Past Surgical History:  Procedure Laterality Date  . CARDIAC CATHETERIZATION    . CATARACT EXTRACTION W/ INTRAOCULAR LENS  IMPLANT, BILATERAL Bilateral   . COLECTOMY  2004   Dr [name]  . DIRECT LARYNGOSCOPY N/A 01/15/2016   Procedure: DIRECT LARYNGOSCOPY WITH BIOPSY AND FROZEN SECTION;  Surgeon: Izora Gala, MD;  Location: [facility];  Service: ENT;  Laterality: N/A;  . FlexHD patch repair of chest  wall hernia.  01/28/2011   [name]  . GASTROSTOMY W/ FEEDING TUBE    . IR GENERIC HISTORICAL  10/08/2016   IR GASTROSTOMY TUBE REMOVAL 10/08/2016 Sandi Mariscal, MD [facility]-INTERV RAD  . JOINT REPLACEMENT    . LARYNGETOMY N/A 01/15/2016   Procedure:  TOTAL LARYNGECTOMY;  Surgeon: Izora Gala, MD;  Location: [facility] OR;  Service: ENT;  Laterality: N/A;  . MAZE  12/27/2008   left side lesion set  . MICROLARYNGOSCOPY Right 01/29/2015   Procedure: MICROLARYNGOSCOPY WITH BIOSPY OF RIGHT VOCAL CORD;  Surgeon: Izora Gala, MD;  Location: [facility];  Service: ENT;  Laterality: Right;  . MITRAL VALVE REPAIR  12/27/2008   complex valvuloplasty with 50m Memo 3D annuloplasty ring via right minithoracotomy  . PECTORALIS FLAP Left 05/04/2016   Procedure: PECTORALIS FLAP to neck with possible, split thickness skin graft;  Surgeon: CLoel LoftyDillingham, DO;  [place]  Service: Plastics;  Laterality: Left;  . PROSTATE BIOPSY    . SKIN SPLIT GRAFT Left 05/04/2016   Procedure: PECTORALIS MAJOR MYOCUTANEOUS FLAP RECONSTRUCTION OF PHARYNX AND SPLIT THICKNESS SKIN GRAFT;  Surgeon: JIzora Gala MD;  [place]  Service: ENT;  Laterality: Left;  . TEE WITHOUT CARDIOVERSION N/A 01/03/2013   Procedure: TRANSESOPHAGEAL ECHOCARDIOGRAM (TEE);  Surgeon: TSueanne Margarita MD;  [place]  Service: Cardiovascular;  Laterality: N/A;  . TOTAL KNEE ARTHROPLASTY Right 2003  . TRACHEAL ESOPHAGEAL PUNCTURE REPAIR N/A 03/26/2016   Procedure: TRACHEAL ESOPHAGEAL PUNCTURE;  Surgeon: JIzora Gala MD;  [facility];  Service: ENT;  Laterality: N/A;  . TRACHEOESOPHAGEAL FISTULA REPAIR N/A 03/26/2016   Procedure: TRACHEO-ESOPHAGEAL   PUNCTURE,FISTULAR CLOSURE;  Surgeon: JIzora Gala MD;  [place]  Service: ENT;  Laterality: N/A;  . TRACHEOESOPHAGEAL FISTULA REPAIR N/A 04/09/2016   Procedure: FISTULA REPAIR WITH  MUSCLE ROTATION FLAP;  Surgeon: JIzora Gala MD;  [place] [facility]  Service: ENT;  Laterality: N/A;  . TRACHEOESOPHAGEAL  FISTULA REPAIR N/A 07/27/2016   Procedure: CLOSURE OF FISTULA;  Surgeon: JIzora Gala MD;  [place]  Service: ENT;  Laterality: N/A;  . TYMPANOPLASTY  1967   "? side"   Social History:   reports that he has never smoked. He has never used smokeless tobacco. He reports that he drinks alcohol. He reports that he does not use drugs.  Family History  Problem Relation Age of Onset  . Cancer Mother        lymphoma  . Cancer Father        pancreatic  . Heart attack Neg Hx   . Stroke Neg Hx     Medications: Patient's Medications  New Prescriptions   No medications on file  Previous Medications  ATORVASTATIN (LIPITOR) 40 MG TABLET    Take 40 mg by mouth daily.   CHOLECALCIFEROL (VITAMIN D) 2000 UNITS CAPS    Give 2,000 Units by tube daily.    FUROSEMIDE (LASIX) 40 MG TABLET    Take 1 tablet (40 mg total) by mouth daily.   ZOLPIDEM (AMBIEN) 5 MG TABLET    TAKE ONE TABLET BY MOUTH EVERY NIGHT AT BEDTIME  Modified Medications   No medications on file  Discontinued Medications   No medications on file     Physical Exam:  Vitals:   03/29/17 1324  BP: (!) 144/84  Pulse: 73  Resp: 17  Temp: 97.7 F (36.5 C)  TempSrc: Oral  SpO2: 98%  Weight: 183 lb 9.6 oz (83.3 kg)  Height: 5' 11"  (1.803 m)   Body mass index is 25.61 kg/m.  Physical Exam  Constitutional: He is oriented to person, place, and time. He appears well-developed and well-nourished. No distress.  HENT:  Head: Normocephalic and atraumatic.  Right Ear: External ear normal.  Left Ear: External ear normal.  Nose: Nose normal.  Mouth/Throat: Oropharynx is clear and moist. No oropharyngeal exudate.  Eyes: Pupils are equal, round, and reactive to light. Conjunctivae and EOM are normal.  Neck: Normal range of motion. No JVD present.  Stoma, has amplifier for talking  Cardiovascular: Normal rate and normal heart sounds.   irreg   Pulmonary/Chest: Effort normal and breath sounds normal. No respiratory distress.  He has no wheezes. He has no rales. He exhibits no tenderness.  Musculoskeletal: Normal range of motion. He exhibits edema (2-3+ bilaterally). He exhibits no tenderness.  Lymphadenopathy:    He has no cervical adenopathy.  Neurological: He is alert and oriented to person, place, and time.  Skin: Skin is warm and dry.  Psychiatric: He has a normal mood and affect. His behavior is normal. Judgment and thought content normal.    Labs reviewed: Basic Metabolic Panel:  Recent Labs  12/06/16 1048 12/06/16 1350 12/08/16 0238 03/23/17 1422  NA 138  --  136 140  K 3.8  --  3.2* 5.3  CL 109  --  106 107  CO2 21*  --  23 21  GLUCOSE 88  --  112* 97  BUN 22*  --  22* 22  CREATININE 0.88 0.87 1.04 1.43*  CALCIUM 7.8*  --  7.7* 8.8  MG 2.1  --   --   --   PHOS 2.9  --   --   --   TSH  --   --   --  7.31*   Liver Function Tests:  Recent Labs  12/06/16 1048 12/08/16 0238 03/23/17 1422  AST 21 17 18   ALT 19 18 15   ALKPHOS 64 68 94  BILITOT 1.3* 0.8 1.1  PROT 5.9* 5.9* 6.2  ALBUMIN 2.6* 2.4* 3.7   No results for input(s): LIPASE, AMYLASE in the last 8760 hours. No results for input(s): AMMONIA in the last 8760 hours. CBC:  Recent Labs  12/06/16 0910 12/06/16 1350 12/08/16 0238 03/23/17 1422  WBC 9.8 8.5 8.9 6.9  NEUTROABS 7.5  --   --  4,692  HGB 13.9 14.3 13.3 14.7  HCT 41.7 43.1 40.0 44.1  MCV 97.4 98.2 95.0 96.9  PLT 156 159 187 141   Lipid Panel:  Recent Labs  12/21/16 0949  CHOL 141  HDL 52  LDLCALC 77  TRIG 62  CHOLHDL 2.7   TSH:  Recent Labs  03/23/17 1422  TSH  7.31*   A1C: Lab Results  Component Value Date   HGBA1C  September 23, 202010    5.3 (NOTE) The ADA recommends the following therapeutic goal for glycemic control related to Hgb A1c measurement: Goal of therapy: <6.5 Hgb A1c  Reference: American Diabetes Association: Clinical Practice Recommendations 2010, Diabetes Care, 2010, 33: (Suppl  1).     Assessment/Plan 1. Chronic systolic  congestive heart failure (HCC) CHF noted on chest xray and with elevated BNP therefore lasix 40 mg daily started. Reports overall he is feeling better.  -will cont lasix 40 mg daily for now and follow up labs.  - BMP with eGFR - BMP with eGFR; Future  2. Protein-calorie malnutrition, severe Improved on recent labs, cont to encourage good protein in diet.   3. Weakness Improving now that fluid is coming off, offered PT but will wait on this for now  4. Shortness of breath Improved with lasix - BMP with eGFR  5. LE edema To elevate legs when sitting. May use compression hose during the day.  5. Irregular heartbeat Noted to have irreg rhythm. - EKG 12-Lead reviewed with Dr Mariea Clonts, rate of 94, right BBB and similar to previous with 1 degree block but possible 2nd degree with place referral to Cardiology to follow up due to EKG changes and now with CHF exacerbation.    to follow up with cardiology and to keep follow up with Dr Sharee Holster K. Harle Battiest  [facility] [phone] 8 am - 5 pm) [phone] (after hours)

## 2017-03-29 NOTE — Patient Instructions (Signed)
To elevate legs when sitting- avoid letting them dangle, gravity makes swelling worse To use compression hose- on during the day- put on as soon as you wake up--  off at bedtime  To cont lasix 40 mg daily for now Will get blood work today and before follow up in 1 month

## 2017-03-30 DIAGNOSIS — I5022 Chronic systolic (congestive) heart failure: Secondary | ICD-10-CM | POA: Insufficient documentation

## 2017-03-30 LAB — BASIC METABOLIC PANEL WITH GFR
BUN: 24 mg/dL (ref 7–25)
CHLORIDE: 105 mmol/L (ref 98–110)
CO2: 22 mmol/L (ref 20–32)
CREATININE: 1.26 mg/dL — AB (ref 0.70–1.11)
Calcium: 8.7 mg/dL (ref 8.6–10.3)
GFR, Est African American: 58 mL/min — ABNORMAL LOW (ref >=60)
GFR, Est Non African American: 50 mL/min — ABNORMAL LOW (ref >=60)
Glucose, Bld: 86 mg/dL (ref 65–99)
POTASSIUM: 4.5 mmol/L (ref 3.5–5.3)
Sodium: 140 mmol/L (ref 135–146)

## 2017-04-07 ENCOUNTER — Other Ambulatory Visit: Payer: Self-pay | Admitting: Internal Medicine

## 2017-04-19 DIAGNOSIS — I420 Dilated cardiomyopathy: Secondary | ICD-10-CM | POA: Insufficient documentation

## 2017-04-19 NOTE — Progress Notes (Signed)
Cardiology Office Note    Date:  04/20/2017   ID:  Timothy Suarez, DOB 02-Mar-1925, MRN [id number]  PCP:  Gayland Curry, DO  Cardiologist: Dr. Radford Pax  No chief complaint on file.   History of Present Illness:   Timothy Suarez is a 83 y.o. male who is being seen today for the evaluation of first degree heart block and CHF at the request of Timothy Chandler, NP.  Patient has a history of PAF and MVP with severe MR status post right minithoracotomy with MVP repair and Maze procedure 12/2008, chest wall hernia at thoracotomy site status post repair, hypertension, nonobstructive CAD, and colon cancer. Previous LVEF of 45% but last seen by [place] in 2016 at which time 2-D echo showed normal LVEF 50-55% with septal hypokinesis, grade 1 DD 01/03/15. No ischemia on Myoview that same day. Holter monitor showed PACs and PVCs treated with beta blocker. He had a prolonged QT with some dizziness but no syncope and Remeron was stopped.  Patient was hospitalized 12/2016 with CHF and pneumonia after focal cord surgery status post salvage laryngectomy. He was treated with Lasix and antibiotics. Troponins were negative 3 and 2-D echo was pending when he was discharged. We did not see him that admission. Blood pressure was soft and Norvasc was stopped. Review of 2-D echo from 01/02/17 showed severely reduced LV function EF 25-30% with diffuse hypokinesis, mild to moderate aortic regurgitation, mild MR.  Patient was seen by primary care 03/23/17 with worsening shortness of breath and weight gain of 3 pounds. He was placed on Lasix with improvement. It was noted that he had first-degree AV block and possibly second-degree AV block by EKG is not in system for [place] to see. He is referred for evaluation of both. BNP was 2357 potassium 5.3 creatinine 1.43, TSH was elevated at 7.3, chest x-ray congestive heart failure with bilateral interstitial edema and small bilateral pleural effusions. Follow-up with primary care on  03/29/17 showed patient's heart failure had improved but he still had some leg edema.  Patient comes in today accompanied by his daughter. He lives at [facility] and eats 2 meals/day there and doesn't know how much salt is in the food but he doesn't use any extra. He complains of occasional dizziness when he changes position. Has long history of heart skipping.      Past Medical History:  Diagnosis Date  . A-fib ([place])   . Aortic valve insufficiency, acquired   . Arthritis   . BPH (benign prostatic hyperplasia)   . Cerebral ischemia   . Colon cancer [facility])     s/p partial colectomy  . Complication of anesthesia    "woke up w/confusion and hallucinations once after mitral valve OR"  . Coronary artery disease    50-70% LAD mitral valve repair 4+ yrs ago  . Depression   . Dyslipidemia   . Glaucoma   . Heart murmur   . Hernia   . High cholesterol   . History of echocardiogram    Echo 5/16:  Mild LVH, EF 50-55%, Gr 1 DD, septal HK, Ao sclerosis without stenosis, mild AI, MV repair ok with borderline mild MS (mean 4 mmHg), mild MR, mild LAE, mod RAE, mild TR, PASP 30 mmHg  . HTN (hypertension)    takes Amlodipine daily  . Hx of cardiovascular stress test    Lexiscan Myoview 5/16:  Apical thinning, EF not gated, no ischemia. Low Risk  . Hyperlipidemia   . Major  depression   . Mild cognitive impairment   . MVP (mitral valve prolapse)    S/P Rt mini thoractomy for Mitral Valve repair  . Nocturia   . Paroxysmal atrial fibrillation (HCC)    S/P Maze procedure  . 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 Prostate cancer ([place])   . 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 Shortness of breath dyspnea    with exertion  . Sleep disturbance   . Thoracic aortic aneurysm ([place])   . Urinary frequency   . Urinary urgency     Past Surgical History:  Procedure Laterality Date  . CARDIAC CATHETERIZATION    . CATARACT  EXTRACTION W/ INTRAOCULAR LENS  IMPLANT, BILATERAL Bilateral   . COLECTOMY  2004   Dr [name]  . DIRECT LARYNGOSCOPY N/A 01/15/2016   Procedure: DIRECT LARYNGOSCOPY WITH BIOPSY AND FROZEN SECTION;  Surgeon: Izora Gala, MD;  Location: [place];  Service: ENT;  Laterality: N/A;  . FlexHD patch repair of chest wall hernia.  01/28/2011   [name]  . GASTROSTOMY W/ FEEDING TUBE    . IR GENERIC HISTORICAL  10/08/2016   IR GASTROSTOMY TUBE REMOVAL 10/08/2016 Sandi Mariscal, MD [facility]-INTERV RAD  . JOINT REPLACEMENT    . LARYNGETOMY N/A 01/15/2016   Procedure:  TOTAL LARYNGECTOMY;  Surgeon: Izora Gala, MD;  Location: [facility] OR;  Service: ENT;  Laterality: N/A;  . MAZE  12/27/2008   left side lesion set  . MICROLARYNGOSCOPY Right 01/29/2015   Procedure: MICROLARYNGOSCOPY WITH BIOSPY OF RIGHT VOCAL CORD;  Surgeon: Izora Gala, MD;  Location: [place];  Service: ENT;  Laterality: Right;  . MITRAL VALVE REPAIR  12/27/2008   complex valvuloplasty with 40mm Memo 3D annuloplasty ring via right minithoracotomy  . PECTORALIS FLAP Left 05/04/2016   Procedure: PECTORALIS FLAP to neck with possible, split thickness skin graft;  Surgeon: Loel Lofty Dillingham, DO;  Location: [place];  Service: Plastics;  Laterality: Left;  . PROSTATE BIOPSY    . SKIN SPLIT GRAFT Left 05/04/2016   Procedure: PECTORALIS MAJOR MYOCUTANEOUS FLAP RECONSTRUCTION OF PHARYNX AND SPLIT THICKNESS SKIN GRAFT;  Surgeon: Izora Gala, MD;  Location: [place];  Service: ENT;  Laterality: Left;  . TEE WITHOUT CARDIOVERSION N/A 01/03/2013   Procedure: TRANSESOPHAGEAL ECHOCARDIOGRAM (TEE);  Surgeon: Sueanne Margarita, MD;  Location: [place];  Service: Cardiovascular;  Laterality: N/A;  . TOTAL KNEE ARTHROPLASTY Right 2003  . TRACHEAL ESOPHAGEAL PUNCTURE REPAIR N/A 03/26/2016   Procedure: TRACHEAL ESOPHAGEAL PUNCTURE;  Surgeon: Izora Gala, MD;  Location: [place];  Service: ENT;  Laterality: N/A;  . TRACHEOESOPHAGEAL FISTULA REPAIR N/A 03/26/2016   Procedure: TRACHEO-ESOPHAGEAL    PUNCTURE,FISTULAR CLOSURE;  Surgeon: Izora Gala, MD;  Location: [place];  Service: ENT;  Laterality: N/A;  . TRACHEOESOPHAGEAL FISTULA REPAIR N/A 04/09/2016   Procedure: FISTULA REPAIR WITH  MUSCLE ROTATION FLAP;  Surgeon: Izora Gala, MD;  Location: [place];  Service: ENT;  Laterality: N/A;  . TRACHEOESOPHAGEAL FISTULA REPAIR N/A 07/27/2016   Procedure: CLOSURE OF FISTULA;  Surgeon: Izora Gala, MD;  Location: [place];  Service: ENT;  Laterality: N/A;  . TYMPANOPLASTY  1967   "? side"    Current Medications: Current Meds  Medication Sig  . atorvastatin (LIPITOR) 40 MG tablet Take 1 tablet (40 mg total) by mouth daily.  . Cholecalciferol (VITAMIN D) 2000 units CAPS Give 2,000 Units by tube daily.   . furosemide (LASIX) 40 MG  tablet Take 1 tablet (40 mg total) by mouth daily.  Marland Kitchen zolpidem (AMBIEN) 5 MG tablet TAKE ONE TABLET BY MOUTH EVERY NIGHT AT BEDTIME     Allergies:   Citalopram; Trazodone and nefazodone; and Zoloft [sertraline hcl]   Social History   Social History  . Marital status: Widowed    Spouse name: N/A  . Number of children: 2  . Years of education: 14   Occupational History  . retired Nutritional therapist     Social History Main Topics  . Smoking status: Never Smoker  . Smokeless tobacco: Never Used  . Alcohol use 0.0 oz/week     Comment: rare  . Drug use: No  . Sexual activity: No   Other Topics Concern  . None   Social History Narrative   Widowed   Never smoked   Alcohol  Occasionally    Exercise - walking the [employer] retail with cane   [place], HCPOA                 Family History:  The patient's family history includes Cancer in his father and mother.   ROS:   Please see the history of present illness.    Review of Systems  Constitution: Positive for malaise/fatigue.  HENT: Negative.   Cardiovascular: Positive for dyspnea on exertion.  Respiratory: Positive for cough.   Hematologic/Lymphatic: Negative.   Musculoskeletal:  Negative.   Gastrointestinal: Negative.   Genitourinary: Negative.   Neurological: Positive for dizziness.   All other systems reviewed and are negative.   PHYSICAL EXAM:   VS:  BP 124/74   Pulse 91   Ht 5\' 11"  (1.803 m)   Wt 181 lb (82.1 kg)   SpO2 96%   BMI 25.24 kg/m   Physical Exam  GEN: Well nourished, well developed, in no acute distress  Neck: no JVD, carotid bruits, or masses Cardiac:Irreg 1/6 sys murmur apex no rubs, or gallops  Respiratory:  clear to auscultation bilaterally, normal work of breathing GI: soft, nontender, nondistended, + BS Ext: without cyanosis, clubbing, or edema, Good distal pulses bilaterally Neuro:  Alert and Oriented x 3 Psych: euthymic mood, full affect  Wt Readings from Last 3 Encounters:  04/20/17 181 lb (82.1 kg)  03/29/17 183 lb 9.6 oz (83.3 kg)  03/23/17 187 lb 12.8 oz (85.2 kg)      Studies/Labs Reviewed:   EKG:  EKG is  ordered today.  The ekg With rhythm strip ordered today and reviewed by Dr. Acie Fredrickson demonstrates Normal sinus rhythm with first degree AV block, wandering atrial pacemaker, PACs and PVCs. No evidence of high degree AV block or atrial fibrillation.  Recent Labs: 12/06/2016: Magnesium 2.1 03/23/2017: ALT 15; Brain Natriuretic Peptide 2,357.2; Hemoglobin 14.7; Platelets 141; TSH 7.31 03/29/2017: BUN 24; Creat 1.26; Potassium 4.5; Sodium 140   Lipid Panel    Component Value Date/Time   CHOL 141 12/21/2016 0949   CHOL 144 08/26/2015 1042   TRIG 62 12/21/2016 0949   HDL 52 12/21/2016 0949   HDL 49 08/26/2015 1042   CHOLHDL 2.7 12/21/2016 0949   VLDL 12 12/21/2016 0949   LDLCALC 77 12/21/2016 0949   LDLCALC 68 08/26/2015 1042    Additional studies/ records that were reviewed today include:  2-D echo I/26/18 Study Conclusions   - Left ventricle: The cavity size was mildly dilated. Wall   thickness was normal. Systolic function was severely reduced. The   estimated ejection fraction  was in the range of 25% to  30%.   Diffuse hypokinesis. - Aortic valve: There was mild regurgitation. - Aortic root: The aortic root was mildly dilated. - Mitral valve: Prior procedures included surgical repair. There   was mild regurgitation. - Left atrium: The atrium was mildly dilated.   Impressions:   - Severe global reduction in LV systoilc function; mild LVE; mild   to moderate AI; mildly dilated aortic root; s/p MV repair with   mild MR; mild LAE; mild TR.    2-D echo 2016Study Conclusions   - Left ventricle: The cavity size was normal. Wall thickness was   increased in a pattern of mild LVH. Systolic function was normal.   The estimated ejection fraction was in the range of 50% to 55%.   Septal hypokinesis. Doppler parameters are consistent with   abnormal left ventricular relaxation (grade 1 diastolic   dysfunction). - Aortic valve: Trileaflet. Sclerosis without stenosis. There was   mild regurgitation. - Mitral valve: s/p Annuloplasty. Mildly thickened leaflets -   borderline mild stenosis. There was mild regurgitation. Valve   area by continuity equation (using LVOT flow): 1.89 cm^2. - Left atrium: The atrium was mildly dilated at 36 ml/m2. - Right atrium: Moderately dilated. - Tricuspid valve: There was mild regurgitation. - Pulmonary arteries: PA peak pressure: 30 mm Hg (S). - Inferior vena cava: The vessel was normal in size. The   respirophasic diameter changes were in the normal range (>= 50%),   consistent with normal central venous pressure.   Impressions:   - Compared to the prior study in 2014, there have been no   significant changes.      ASSESSMENT:    1. Dilated cardiomyopathy (HCC)   2. Paroxysmal atrial fibrillation ([place])   3. Essential hypertension   4. Moderate aortic insufficiency   5. Chronic systolic congestive heart failure ([place])   6. S/P MVR (mitral valve repair)   7. S/P Maze operation for atrial fibrillation   8. Coronary artery disease involving native  coronary artery of native heart without angina pectoris      PLAN:  In order of problems listed above:  Dilated cardiomyopathy ejection fraction 20-25% with diffuse hypokinesis on 2-D echo 01/02/17 which is a new change for him with recent CHF.Patient did have renal insufficiency in the hospital so cannot use ACE inhibitor/ARB or spironolactone. Would continue Lasix 40 mg once daily and 2 g sodium diet. May be a little reduced Lasix to 20 mg daily the future. Will check bmet today and add potassium if needed.Discussed with Dr. Acie Fredrickson who concurs. F/u with Dr. Radford Pax next available.  PAF status post maze procedure without recurrence. No evidence of high degree heart block.  Essential hypertension blood pressures have been soft so we'll hold off on adding further medication  Chronic systolic CHF compensated today  Status post MVR and maze procedure with history of frequent PACs and PVCs on Holter in 2016 placed on beta blocker now off because of soft blood pressures. No evidence of atrial fibrillation  CAD records indicate 50-70% LAD in the past negative Myoview in 2016. No symptoms.    Medication Adjustments/Labs and Tests Ordered: Current medicines are reviewed at length with the patient today.  Concerns regarding medicines are outlined above.  Medication changes, Labs and Tests ordered today are listed in the Patient Instructions below. Patient Instructions  Medication Instructions:  Your physician recommends that you continue on your current medications as directed. Please refer to the  Current Medication list given to you today.   Labwork: None ordered  Testing/Procedures: None ordered  Follow-Up: Your physician recommends that you schedule a follow-up appointment in:    Any Other Special Instructions Will Be Listed Below (If Applicable).     If you need a refill on your cardiac medications before your next appointment, please call your pharmacy.      Sumner Boast, PA-C  04/20/2017 9:29 AM    [facility] [place], [place], [place]  [address] Phone: [phone]; Fax: [phone]

## 2017-04-20 ENCOUNTER — Ambulatory Visit (INDEPENDENT_AMBULATORY_CARE_PROVIDER_SITE_OTHER): Payer: Medicare Other | Admitting: Physician Assistant

## 2017-04-20 ENCOUNTER — Encounter: Payer: Self-pay | Admitting: Physician Assistant

## 2017-04-20 ENCOUNTER — Other Ambulatory Visit: Payer: Self-pay | Admitting: Nurse Practitioner

## 2017-04-20 VITALS — BP 124/74 | HR 91 | Ht 71.0 in | Wt 181.0 lb

## 2017-04-20 DIAGNOSIS — I420 Dilated cardiomyopathy: Secondary | ICD-10-CM | POA: Diagnosis not present

## 2017-04-20 DIAGNOSIS — I10 Essential (primary) hypertension: Secondary | ICD-10-CM | POA: Diagnosis not present

## 2017-04-20 DIAGNOSIS — I5022 Chronic systolic (congestive) heart failure: Secondary | ICD-10-CM

## 2017-04-20 DIAGNOSIS — I2510 Atherosclerotic heart disease of native coronary artery without angina pectoris: Secondary | ICD-10-CM | POA: Diagnosis not present

## 2017-04-20 DIAGNOSIS — I351 Nonrheumatic aortic (valve) insufficiency: Secondary | ICD-10-CM

## 2017-04-20 DIAGNOSIS — I480 Paroxysmal atrial fibrillation: Secondary | ICD-10-CM

## 2017-04-20 DIAGNOSIS — Z9889 Other specified postprocedural states: Secondary | ICD-10-CM | POA: Diagnosis not present

## 2017-04-20 DIAGNOSIS — Z8679 Personal history of other diseases of the circulatory system: Secondary | ICD-10-CM

## 2017-04-20 LAB — BASIC METABOLIC PANEL
BUN / CREAT RATIO: 14 (ref 10–24)
BUN: 16 mg/dL (ref 10–36)
CHLORIDE: 104 mmol/L (ref 96–106)
CO2: 24 mmol/L (ref 20–29)
Calcium: 8.9 mg/dL (ref 8.6–10.2)
Creatinine, Ser: 1.11 mg/dL (ref 0.76–1.27)
GFR calc non Af Amer: 58 mL/min/{1.73_m2} — ABNORMAL LOW (ref >59)
GFR, EST AFRICAN AMERICAN: 67 mL/min/{1.73_m2} (ref >59)
GLUCOSE: 92 mg/dL (ref 65–99)
Potassium: 4.6 mmol/L (ref 3.5–5.2)
SODIUM: 142 mmol/L (ref 134–144)

## 2017-04-20 NOTE — Patient Instructions (Addendum)
Medication Instructions:  Your physician recommends that you continue on your current medications as directed. Please refer to the Current Medication list given to you today.   Labwork: TODAY:  BMET   Testing/Procedures: None ordered  Follow-Up: Your physician recommends that you schedule a follow-up appointment in: NEXT AVAILABLE WITH DR. Radford Pax   Any Other Special Instructions Will Be Listed Below (If Applicable).   DASH Eating Plan DASH stands for "Dietary Approaches to Stop Hypertension." The DASH eating plan is a healthy eating plan that has been shown to reduce high blood pressure (hypertension). It may also reduce your risk for type 2 diabetes, heart disease, and stroke. The DASH eating plan may also help with weight loss. What are tips for following this plan? General guidelines  Avoid eating more than 2,000 mg (milligrams) of salt (sodium) a day. If you have hypertension, you may need to reduce your sodium intake to 1,500 mg a day.  Limit alcohol intake to no more than 1 drink a day for nonpregnant women and 2 drinks a day for men. One drink equals 12 oz of beer, 5 oz of wine, or 1 oz of hard liquor.  Work with your health care provider to maintain a healthy body weight or to lose weight. Ask what an ideal weight is for you.  Get at least 30 minutes of exercise that causes your heart to beat faster (aerobic exercise) most days of the week. Activities may include walking, swimming, or biking.  Work with your health care provider or diet and nutrition specialist (dietitian) to adjust your eating plan to your individual calorie needs. Reading food labels  Check food labels for the amount of sodium per serving. Choose foods with less than 5 percent of the Daily Value of sodium. Generally, foods with less than 300 mg of sodium per serving fit into this eating plan.  To find whole grains, look for the word "whole" as the first word in the ingredient list. Shopping  Buy  products labeled as "low-sodium" or "no salt added."  Buy fresh foods. Avoid canned foods and premade or frozen meals. Cooking  Avoid adding salt when cooking. Use salt-free seasonings or herbs instead of table salt or sea salt. Check with your health care provider or pharmacist before using salt substitutes.  Do not fry foods. Cook foods using healthy methods such as baking, boiling, grilling, and broiling instead.  Cook with heart-healthy oils, such as olive, canola, soybean, or sunflower oil. Meal planning   Eat a balanced diet that includes: ? 5 or more servings of fruits and vegetables each day. At each meal, try to fill half of your plate with fruits and vegetables. ? Up to 6-8 servings of whole grains each day. ? Less than 6 oz of lean meat, poultry, or fish each day. A 3-oz serving of meat is about the same size as a deck of cards. One egg equals 1 oz. ? 2 servings of low-fat dairy each day. ? A serving of nuts, seeds, or beans 5 times each week. ? Heart-healthy fats. Healthy fats called Omega-3 fatty acids are found in foods such as flaxseeds and coldwater fish, like sardines, salmon, and mackerel.  Limit how much you eat of the following: ? Canned or prepackaged foods. ? Food that is high in trans fat, such as fried foods. ? Food that is high in saturated fat, such as fatty meat. ? Sweets, desserts, sugary drinks, and other foods with added sugar. ? Full-fat dairy products.  Do not salt foods before eating.  Try to eat at least 2 vegetarian meals each week.  Eat more home-cooked food and less restaurant, buffet, and fast food.  When eating at a restaurant, ask that your food be prepared with less salt or no salt, if possible. What foods are recommended? The items listed may not be a complete list. Talk with your dietitian about what dietary choices are best for you. Grains Whole-grain or whole-wheat bread. Whole-grain or whole-wheat pasta. Brown rice. Modena Morrow.  Bulgur. Whole-grain and low-sodium cereals. Pita bread. Low-fat, low-sodium crackers. Whole-wheat flour tortillas. Vegetables Fresh or frozen vegetables (raw, steamed, roasted, or grilled). Low-sodium or reduced-sodium tomato and vegetable juice. Low-sodium or reduced-sodium tomato sauce and tomato paste. Low-sodium or reduced-sodium canned vegetables. Fruits All fresh, dried, or frozen fruit. Canned fruit in natural juice (without added sugar). Meat and other protein foods Skinless chicken or [place]. Ground chicken or [place]. Pork with fat trimmed off. Fish and seafood. Egg whites. Dried beans, peas, or lentils. Unsalted nuts, nut butters, and seeds. Unsalted canned beans. Lean cuts of beef with fat trimmed off. Low-sodium, lean deli meat. Dairy Low-fat (1%) or fat-free (skim) milk. Fat-free, low-fat, or reduced-fat cheeses. Nonfat, low-sodium ricotta or cottage cheese. Low-fat or nonfat yogurt. Low-fat, low-sodium cheese. Fats and oils Soft margarine without trans fats. Vegetable oil. Low-fat, reduced-fat, or light mayonnaise and salad dressings (reduced-sodium). Canola, safflower, olive, soybean, and sunflower oils. Avocado. Seasoning and other foods Herbs. Spices. Seasoning mixes without salt. Unsalted popcorn and pretzels. Fat-free sweets. What foods are not recommended? The items listed may not be a complete list. Talk with your dietitian about what dietary choices are best for you. Grains Baked goods made with fat, such as croissants, muffins, or some breads. Dry pasta or rice meal packs. Vegetables Creamed or fried vegetables. Vegetables in a cheese sauce. Regular canned vegetables (not low-sodium or reduced-sodium). Regular canned tomato sauce and paste (not low-sodium or reduced-sodium). Regular tomato and vegetable juice (not low-sodium or reduced-sodium). Angie Fava. Olives. Fruits Canned fruit in a light or heavy syrup. Fried fruit. Fruit in cream or butter sauce. Meat and other  protein foods Fatty cuts of meat. Ribs. Fried meat. Berniece Salines. Sausage. Bologna and other processed lunch meats. Salami. Fatback. Hotdogs. Bratwurst. Salted nuts and seeds. Canned beans with added salt. Canned or smoked fish. Whole eggs or egg yolks. Chicken or [place] with skin. Dairy Whole or 2% milk, cream, and half-and-half. Whole or full-fat cream cheese. Whole-fat or sweetened yogurt. Full-fat cheese. Nondairy creamers. Whipped toppings. Processed cheese and cheese spreads. Fats and oils Butter. Stick margarine. Lard. Shortening. Ghee. Bacon fat. Tropical oils, such as coconut, palm kernel, or palm oil. Seasoning and other foods Salted popcorn and pretzels. Onion salt, garlic salt, seasoned salt, table salt, and sea salt. Worcestershire sauce. Tartar sauce. Barbecue sauce. Teriyaki sauce. Soy sauce, including reduced-sodium. Steak sauce. Canned and packaged gravies. Fish sauce. Oyster sauce. Cocktail sauce. Horseradish that you find on the shelf. Ketchup. Mustard. Meat flavorings and tenderizers. Bouillon cubes. Hot sauce and Tabasco sauce. Premade or packaged marinades. Premade or packaged taco seasonings. Relishes. Regular salad dressings. Where to find more information:  National Heart, Lung, and [place]: https://wilson-eaton.com/  American Heart Association: www.heart.org Summary  The DASH eating plan is a healthy eating plan that has been shown to reduce high blood pressure (hypertension). It may also reduce your risk for type 2 diabetes, heart disease, and stroke.  With the DASH eating plan, you should limit salt (sodium)  intake to 2,300 mg a day. If you have hypertension, you may need to reduce your sodium intake to 1,500 mg a day.  When on the DASH eating plan, aim to eat more fresh fruits and vegetables, whole grains, lean proteins, low-fat dairy, and heart-healthy fats.  Work with your health care provider or diet and nutrition specialist (dietitian) to adjust your eating plan to your  individual calorie needs. This information is not intended to replace advice given to you by your health care provider. Make sure you discuss any questions you have with your health care provider. Document Released: 07/16/2011 Document Revised: 07/20/2016 Document Reviewed: 07/20/2016 Elsevier Interactive Patient Education  2017 [employer].    If you need a refill on your cardiac medications before your next appointment, please call your pharmacy.

## 2017-04-25 DIAGNOSIS — C320 Malignant neoplasm of glottis: Secondary | ICD-10-CM | POA: Diagnosis not present

## 2017-04-25 DIAGNOSIS — Z9002 Acquired absence of larynx: Secondary | ICD-10-CM | POA: Diagnosis not present

## 2017-05-17 ENCOUNTER — Telehealth: Payer: Self-pay | Admitting: *Deleted

## 2017-05-17 NOTE — Telephone Encounter (Signed)
Daughter, Jackelyn Poling called and stated that patient has an appointment with Janett Billow on 05/19/17 for leg weakness. Stated that she thinks it is Muscle weakness but patient thinks its heart related. Stated that her sister is in PT and thinks he needs to strengthen his legs and would like for you to discuss this with the patient at his appointment because he won't listen to them. Thinks he needs PT. Doesn't want you to mention to the patient she called to tell you this.

## 2017-05-18 ENCOUNTER — Other Ambulatory Visit: Payer: Self-pay | Admitting: Nurse Practitioner

## 2017-05-19 ENCOUNTER — Telehealth: Payer: Self-pay | Admitting: Cardiology

## 2017-05-19 ENCOUNTER — Ambulatory Visit: Payer: Medicare Other | Admitting: Nurse Practitioner

## 2017-05-19 NOTE — Telephone Encounter (Signed)
Called patient's daughter (DPR) back about her father. She stated patient has been lethargic, does not have a lot of energy to walk, and  does not feel good today. She stated patient is in a very weak condition.  Patient has an appt, first available with PA on 06/02/17. Informed patient's daughter that message would be sent to Dr. Radford Pax for advisement. Encouraged patient's daughter to call PCP to be evaluated and seen, and he needs to keep his appt with cardiology as well. Patient's daughter verbalized understanding, and patient's daughter stated she would call PCP.

## 2017-05-19 NOTE — Telephone Encounter (Signed)
New message    Pt daughter is calling asking for a call back. She said pt is very weak and would like to talk with the nurse.

## 2017-05-20 ENCOUNTER — Encounter: Payer: Self-pay | Admitting: Nurse Practitioner

## 2017-05-20 ENCOUNTER — Ambulatory Visit (INDEPENDENT_AMBULATORY_CARE_PROVIDER_SITE_OTHER): Payer: Medicare Other | Admitting: Nurse Practitioner

## 2017-05-20 VITALS — BP 120/62 | HR 78 | Temp 98.2°F | Ht 71.0 in | Wt 188.0 lb

## 2017-05-20 DIAGNOSIS — M7122 Synovial cyst of popliteal space [Baker], left knee: Secondary | ICD-10-CM | POA: Diagnosis not present

## 2017-05-20 DIAGNOSIS — R531 Weakness: Secondary | ICD-10-CM | POA: Diagnosis not present

## 2017-05-20 DIAGNOSIS — I5022 Chronic systolic (congestive) heart failure: Secondary | ICD-10-CM

## 2017-05-20 DIAGNOSIS — Z23 Encounter for immunization: Secondary | ICD-10-CM | POA: Diagnosis not present

## 2017-05-20 NOTE — Patient Instructions (Addendum)
To consult with dietary at [facility] to see about foods that are lower in sodium.   Well balanced diet, food with good nutritional values.    DASH Eating Plan DASH stands for "Dietary Approaches to Stop Hypertension." The DASH eating plan is a healthy eating plan that has been shown to reduce high blood pressure (hypertension). It may also reduce your risk for type 2 diabetes, heart disease, and stroke. The DASH eating plan may also help with weight loss. What are tips for following this plan? General guidelines  Avoid eating more than 2,000 mg (milligrams) of salt (sodium) a day. If you have hypertension, you may need to reduce your sodium intake to 1,500 mg a day.  Limit alcohol intake to no more than 1 drink a day for nonpregnant women and 2 drinks a day for men. One drink equals 12 oz of beer, 5 oz of wine, or 1 oz of hard liquor.  Work with your health care provider to maintain a healthy body weight or to lose weight. Ask what an ideal weight is for you.  Get at least 30 minutes of exercise that causes your heart to beat faster (aerobic exercise) most days of the week. Activities may include walking, swimming, or biking.  Work with your health care provider or diet and nutrition specialist (dietitian) to adjust your eating plan to your individual calorie needs. Reading food labels  Check food labels for the amount of sodium per serving. Choose foods with less than 5 percent of the Daily Value of sodium. Generally, foods with less than 300 mg of sodium per serving fit into this eating plan.  To find whole grains, look for the word "whole" as the first word in the ingredient list. Shopping  Buy products labeled as "low-sodium" or "no salt added."  Buy fresh foods. Avoid canned foods and premade or frozen meals. Cooking  Avoid adding salt when cooking. Use salt-free seasonings or herbs instead of table salt or sea salt. Check with your health care provider or pharmacist before  using salt substitutes.  Do not fry foods. Cook foods using healthy methods such as baking, boiling, grilling, and broiling instead.  Cook with heart-healthy oils, such as olive, canola, soybean, or sunflower oil. Meal planning   Eat a balanced diet that includes: ? 5 or more servings of fruits and vegetables each day. At each meal, try to fill half of your plate with fruits and vegetables. ? Up to 6-8 servings of whole grains each day. ? Less than 6 oz of lean meat, poultry, or fish each day. A 3-oz serving of meat is about the same size as a deck of cards. One egg equals 1 oz. ? 2 servings of low-fat dairy each day. ? A serving of nuts, seeds, or beans 5 times each week. ? Heart-healthy fats. Healthy fats called Omega-3 fatty acids are found in foods such as flaxseeds and coldwater fish, like sardines, salmon, and mackerel.  Limit how much you eat of the following: ? Canned or prepackaged foods. ? Food that is high in trans fat, such as fried foods. ? Food that is high in saturated fat, such as fatty meat. ? Sweets, desserts, sugary drinks, and other foods with added sugar. ? Full-fat dairy products.  Do not salt foods before eating.  Try to eat at least 2 vegetarian meals each week.  Eat more home-cooked food and less restaurant, buffet, and fast food.  When eating at a restaurant, ask that your food  be prepared with less salt or no salt, if possible. What foods are recommended? The items listed may not be a complete list. Talk with your dietitian about what dietary choices are best for you. Grains Whole-grain or whole-wheat bread. Whole-grain or whole-wheat pasta. Brown rice. Modena Morrow. Bulgur. Whole-grain and low-sodium cereals. Pita bread. Low-fat, low-sodium crackers. Whole-wheat flour tortillas. Vegetables Fresh or frozen vegetables (raw, steamed, roasted, or grilled). Low-sodium or reduced-sodium tomato and vegetable juice. Low-sodium or reduced-sodium tomato sauce  and tomato paste. Low-sodium or reduced-sodium canned vegetables. Fruits All fresh, dried, or frozen fruit. Canned fruit in natural juice (without added sugar). Meat and other protein foods Skinless chicken or [place]. Ground chicken or [place]. Pork with fat trimmed off. Fish and seafood. Egg whites. Dried beans, peas, or lentils. Unsalted nuts, nut butters, and seeds. Unsalted canned beans. Lean cuts of beef with fat trimmed off. Low-sodium, lean deli meat. Dairy Low-fat (1%) or fat-free (skim) milk. Fat-free, low-fat, or reduced-fat cheeses. Nonfat, low-sodium ricotta or cottage cheese. Low-fat or nonfat yogurt. Low-fat, low-sodium cheese. Fats and oils Soft margarine without trans fats. Vegetable oil. Low-fat, reduced-fat, or light mayonnaise and salad dressings (reduced-sodium). Canola, safflower, olive, soybean, and sunflower oils. Avocado. Seasoning and other foods Herbs. Spices. Seasoning mixes without salt. Unsalted popcorn and pretzels. Fat-free sweets. What foods are not recommended? The items listed may not be a complete list. Talk with your dietitian about what dietary choices are best for you. Grains Baked goods made with fat, such as croissants, muffins, or some breads. Dry pasta or rice meal packs. Vegetables Creamed or fried vegetables. Vegetables in a cheese sauce. Regular canned vegetables (not low-sodium or reduced-sodium). Regular canned tomato sauce and paste (not low-sodium or reduced-sodium). Regular tomato and vegetable juice (not low-sodium or reduced-sodium). Angie Fava. Olives. Fruits Canned fruit in a light or heavy syrup. Fried fruit. Fruit in cream or butter sauce. Meat and other protein foods Fatty cuts of meat. Ribs. Fried meat. Berniece Salines. Sausage. Bologna and other processed lunch meats. Salami. Fatback. Hotdogs. Bratwurst. Salted nuts and seeds. Canned beans with added salt. Canned or smoked fish. Whole eggs or egg yolks. Chicken or [place] with skin. Dairy Whole or 2%  milk, cream, and half-and-half. Whole or full-fat cream cheese. Whole-fat or sweetened yogurt. Full-fat cheese. Nondairy creamers. Whipped toppings. Processed cheese and cheese spreads. Fats and oils Butter. Stick margarine. Lard. Shortening. Ghee. Bacon fat. Tropical oils, such as coconut, palm kernel, or palm oil. Seasoning and other foods Salted popcorn and pretzels. Onion salt, garlic salt, seasoned salt, table salt, and sea salt. Worcestershire sauce. Tartar sauce. Barbecue sauce. Teriyaki sauce. Soy sauce, including reduced-sodium. Steak sauce. Canned and packaged gravies. Fish sauce. Oyster sauce. Cocktail sauce. Horseradish that you find on the shelf. Ketchup. Mustard. Meat flavorings and tenderizers. Bouillon cubes. Hot sauce and Tabasco sauce. Premade or packaged marinades. Premade or packaged taco seasonings. Relishes. Regular salad dressings. Where to find more information:  National Heart, Lung, and [place]: https://wilson-eaton.com/  American Heart Association: www.heart.org Summary  The DASH eating plan is a healthy eating plan that has been shown to reduce high blood pressure (hypertension). It may also reduce your risk for type 2 diabetes, heart disease, and stroke.  With the DASH eating plan, you should limit salt (sodium) intake to 2,300 mg a day. If you have hypertension, you may need to reduce your sodium intake to 1,500 mg a day.  When on the DASH eating plan, aim to eat more fresh fruits and vegetables, whole  grains, lean proteins, low-fat dairy, and heart-healthy fats.  Work with your health care provider or diet and nutrition specialist (dietitian) to adjust your eating plan to your individual calorie needs. This information is not intended to replace advice given to you by your health care provider. Make sure you discuss any questions you have with your health care provider. Document Released: 07/16/2011 Document Revised: 07/20/2016 Document Reviewed:  07/20/2016 Elsevier Interactive Patient Education  2017 [employer].

## 2017-05-20 NOTE — Progress Notes (Signed)
Careteam: Patient Care Team: Gayland Curry, DO as PCP - General (Geriatric Medicine) Sueanne Margarita, MD as Consulting Physician (Cardiology) Tyler Pita, MD as Consulting Physician (Radiation Oncology) Izora Gala, MD as Consulting Physician (Otolaryngology)  Advanced Directive information Does Patient Have a Medical Advance Directive?: Yes, Type of Advance Directive: Living will  Allergies  Allergen Reactions  . Citalopram Nausea Only  . Trazodone And Nefazodone Other (See Comments)    Dry mouth  . Zoloft [Sertraline Hcl] Other (See Comments)    dizzy    Chief Complaint  Patient presents with  . Acute Visit    weakness in legs, about a week, less mobile. Here with daughter Jackelyn Poling     HPI: Patient is a 83 y.o. male seen in the office today for leg weakness.  Prior to appt "Daughter, Jackelyn Poling called and stated that patient has an appointment with Janett Billow for leg weakness. Stated that she thinks it is Muscle weakness but patient thinks its heart related. Stated that her sister is in PT and thinks he needs to strengthen his legs and would like for you to discuss this with the patient at his appointment because he won't listen to them. Thinks he needs PT. Doesn't want you to mention to the patient she called to tell you this."  Pt with hx of dilated cardiomyopathy (EF 20-25%), vocal cord surgery status post salvage laryngectomy,CHF, PAF and MVP with severe MR status post right minithoracotomy with MVP repair and Maze procedure 12/2008, chest wall hernia at thoracotomy site status post repair, hypertension, nonobstructive CAD, and colon cancer following with cardiologist after elevated BNP. He was placed on lasix 40 mg daily and 2 gm sodium diet.   Pt has been less mobile and weak over the last 2 weeks.  No worsening of shortness of breath.  Swelling in LE. Using compression hose. Taking lasix 40 mg daily as prescribed. Eats at [facility] they prepare his food.   Hx of  bakers cyst which he has drained a few times a year. No worsening pain with this.   Walked up and down the halls at his living facility and then felt bad the next day, daughter said this was the most activity he has had in a long time. No worsening shortness of breath or chest pains with this but was less active after the increase in activity. Feels like he is just very debilitated. Doing better today.   Review of Systems:  Review of Systems  Constitutional: Negative for chills and fever.  HENT: Positive for hearing loss. Negative for congestion.        Has stoma with amplifier  Eyes:       Glasses  Respiratory: Negative for cough, sputum production, shortness of breath and wheezing.   Cardiovascular: Positive for leg swelling. Negative for chest pain, palpitations and PND.  Gastrointestinal: Negative for abdominal pain, blood in stool, constipation and melena.  Musculoskeletal: Positive for joint pain.  Skin: Negative for itching and rash.  Neurological: Positive for weakness. Negative for dizziness and loss of consciousness.  Endo/Heme/Allergies: Bruises/bleeds easily.  Psychiatric/Behavioral: Positive for memory loss. Negative for depression. The patient is not nervous/anxious.        Mild cognitive impairment    Past Medical History:  Diagnosis Date  . A-fib ([place])   . Aortic valve insufficiency, acquired   . Arthritis   . BPH (benign prostatic hyperplasia)   . Cerebral ischemia   . Colon cancer ([place])  s/p partial colectomy  . Complication of anesthesia    "woke up w/confusion and hallucinations once after mitral valve OR"  . Coronary artery disease    50-70% LAD mitral valve repair 4+ yrs ago  . Depression   . Dyslipidemia   . Glaucoma   . Heart murmur   . Hernia   . High cholesterol   . History of echocardiogram    Echo 5/16:  Mild LVH, EF 50-55%, Gr 1 DD, septal HK, Ao sclerosis without stenosis, mild AI, MV repair ok with borderline mild MS (mean 4 mmHg), mild MR,  mild LAE, mod RAE, mild TR, PASP 30 mmHg  . HTN (hypertension)    takes Amlodipine daily  . Hx of cardiovascular stress test    Lexiscan Myoview 5/16:  Apical thinning, EF not gated, no ischemia. Low Risk  . Hyperlipidemia   . Major depression   . Mild cognitive impairment   . MVP (mitral valve prolapse)    S/P Rt mini thoractomy for Mitral Valve repair  . Nocturia   . Paroxysmal atrial fibrillation (HCC)    S/P Maze procedure  . 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 Prostate cancer ([place])   . 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 Shortness of breath dyspnea    with exertion  . Sleep disturbance   . Thoracic aortic aneurysm ([place])   . Urinary frequency   . Urinary urgency    Past Surgical History:  Procedure Laterality Date  . CARDIAC CATHETERIZATION    . CATARACT EXTRACTION W/ INTRAOCULAR LENS  IMPLANT, BILATERAL Bilateral   . COLECTOMY  2004   Dr [name]  . DIRECT LARYNGOSCOPY N/A 01/15/2016   Procedure: DIRECT LARYNGOSCOPY WITH BIOPSY AND FROZEN SECTION;  Surgeon: Izora Gala, MD;  Location: [facility];  Service: ENT;  Laterality: N/A;  . FlexHD patch repair of chest wall hernia.  01/28/2011   [name]  . GASTROSTOMY W/ FEEDING TUBE    . IR GENERIC HISTORICAL  10/08/2016   IR GASTROSTOMY TUBE REMOVAL 10/08/2016 Sandi Mariscal, MD [facility]-INTERV RAD  . JOINT REPLACEMENT    . LARYNGETOMY N/A 01/15/2016   Procedure:  TOTAL LARYNGECTOMY;  Surgeon: Izora Gala, MD;  Location: [facility] OR;  Service: ENT;  Laterality: N/A;  . MAZE  12/27/2008   left side lesion set  . MICROLARYNGOSCOPY Right 01/29/2015   Procedure: MICROLARYNGOSCOPY WITH BIOSPY OF RIGHT VOCAL CORD;  Surgeon: Izora Gala, MD;  Location: [place];  Service: ENT;  Laterality: Right;  . MITRAL VALVE REPAIR  12/27/2008   complex valvuloplasty with 74mm Memo 3D annuloplasty ring via right minithoracotomy  . PECTORALIS FLAP Left 05/04/2016   Procedure: PECTORALIS FLAP to neck with  possible, split thickness skin graft;  Surgeon: Loel Lofty Dillingham, DO;  Location: [place];  Service: Plastics;  Laterality: Left;  . PROSTATE BIOPSY    . SKIN SPLIT GRAFT Left 05/04/2016   Procedure: PECTORALIS MAJOR MYOCUTANEOUS FLAP RECONSTRUCTION OF PHARYNX AND SPLIT THICKNESS SKIN GRAFT;  Surgeon: Izora Gala, MD;  Location: [employer];  Service: ENT;  Laterality: Left;  . TEE WITHOUT CARDIOVERSION N/A 01/03/2013   Procedure: TRANSESOPHAGEAL ECHOCARDIOGRAM (TEE);  Surgeon: Sueanne Margarita, MD;  Location: [place];  Service: Cardiovascular;  Laterality: N/A;  . TOTAL KNEE ARTHROPLASTY Right 2003  . TRACHEAL ESOPHAGEAL PUNCTURE REPAIR N/A 03/26/2016   Procedure: TRACHEAL ESOPHAGEAL PUNCTURE;  Surgeon: Izora Gala, MD;  Location: [place];  Service: ENT;  Laterality: N/A;  . TRACHEOESOPHAGEAL FISTULA REPAIR N/A 03/26/2016   Procedure: TRACHEO-ESOPHAGEAL   PUNCTURE,FISTULAR CLOSURE;  Surgeon: Izora Gala, MD;  Location: [place];  Service: ENT;  Laterality: N/A;  . TRACHEOESOPHAGEAL FISTULA REPAIR N/A 04/09/2016   Procedure: FISTULA REPAIR WITH  MUSCLE ROTATION FLAP;  Surgeon: Izora Gala, MD;  Location: [place];  Service: ENT;  Laterality: N/A;  . TRACHEOESOPHAGEAL FISTULA REPAIR N/A 07/27/2016   Procedure: CLOSURE OF FISTULA;  Surgeon: Izora Gala, MD;  Location: [place];  Service: ENT;  Laterality: N/A;  . TYMPANOPLASTY  1967   "? side"   Social History:   reports that he has never smoked. He has never used smokeless tobacco. He reports that he drinks alcohol. He reports that he does not use drugs.  Family History  Problem Relation Age of Onset  . Cancer Mother        lymphoma  . Cancer Father        pancreatic  . Heart attack Neg Hx   . Stroke Neg Hx     Medications: Patient's Medications  New Prescriptions   No medications on file  Previous Medications   ATORVASTATIN (LIPITOR) 40 MG TABLET    Take 1 tablet (40 mg total) by mouth daily.   CHOLECALCIFEROL (VITAMIN D) 2000 UNITS CAPS    Give  2,000 Units by tube daily.    FUROSEMIDE (LASIX) 40 MG TABLET    TAKE ONE TABLET BY MOUTH DAILY   ZOLPIDEM (AMBIEN) 5 MG TABLET    TAKE ONE TABLET BY MOUTH EVERY NIGHT AT BEDTIME  Modified Medications   No medications on file  Discontinued Medications   No medications on file     Physical Exam:  Vitals:   05/20/17 1101  BP: 120/62  Pulse: 78  Temp: 98.2 F (36.8 C)  TempSrc: Oral  SpO2: 98%  Weight: 188 lb (85.3 kg)  Height: 5\' 11"  (1.803 m)   Body mass index is 26.22 kg/m.  Physical Exam  Constitutional: He is oriented to person, place, and time. He appears well-developed and well-nourished. No distress.  HENT:  Head: Normocephalic and atraumatic.  Right Ear: External ear normal.  Left Ear: External ear normal.  Nose: Nose normal.  Mouth/Throat: Oropharynx is clear and moist. No oropharyngeal exudate.  Eyes: Pupils are equal, round, and reactive to light. Conjunctivae and EOM are normal.  Neck: Normal range of motion. No JVD present.  Stoma, has amplifier for talking  Cardiovascular: Normal rate and normal heart sounds.   irreg   Pulmonary/Chest: Effort normal and breath sounds normal. No respiratory distress. He has no wheezes. He has no rales. He exhibits no tenderness.  Musculoskeletal: Normal range of motion. He exhibits edema (2-3+ bilaterally). He exhibits no tenderness.  Lymphadenopathy:    He has no cervical adenopathy.  Neurological: He is alert and oriented to person, place, and time.  Skin: Skin is warm and dry.  Psychiatric: He has a normal mood and affect. His behavior is normal. Judgment and thought content normal.    Labs reviewed: Basic Metabolic Panel:  Recent Labs  12/06/16 1048  03/23/17 1422 03/29/17 1401 04/20/17 0953  NA 138  < > 140 140 142  K 3.8  < > 5.3 4.5 4.6  CL 109  < > 107 105 104  CO2 21*  < > 21 22 24   GLUCOSE 88  < > 97 86 92  BUN 22*  < > 22 24 16   CREATININE 0.88  < > 1.43*  1.26* 1.11  CALCIUM 7.8*  < > 8.8 8.7 8.9    MG 2.1  --   --   --   --   PHOS 2.9  --   --   --   --   TSH  --   --  7.31*  --   --   < > = values in this interval not displayed. Liver Function Tests:  Recent Labs  12/06/16 1048 12/08/16 0238 03/23/17 1422  AST 21 17 18   ALT 19 18 15   ALKPHOS 64 68 94  BILITOT 1.3* 0.8 1.1  PROT 5.9* 5.9* 6.2  ALBUMIN 2.6* 2.4* 3.7   No results for input(s): LIPASE, AMYLASE in the last 8760 hours. No results for input(s): AMMONIA in the last 8760 hours. CBC:  Recent Labs  12/06/16 0910 12/06/16 1350 12/08/16 0238 03/23/17 1422  WBC 9.8 8.5 8.9 6.9  NEUTROABS 7.5  --   --  4,692  HGB 13.9 14.3 13.3 14.7  HCT 41.7 43.1 40.0 44.1  MCV 97.4 98.2 95.0 96.9  PLT 156 159 187 141   Lipid Panel:  Recent Labs  12/21/16 0949  CHOL 141  HDL 52  LDLCALC 77  TRIG 62  CHOLHDL 2.7   TSH:  Recent Labs  03/23/17 1422  TSH 7.31*   A1C: Lab Results  Component Value Date   HGBA1C  2020/03/1909    5.3 (NOTE) The ADA recommends the following therapeutic goal for glycemic control related to Hgb A1c measurement: Goal of therapy: <6.5 Hgb A1c  Reference: American Diabetes Association: Clinical Practice Recommendations 2010, Diabetes Care, 2010, 33: (Suppl  1).     Assessment/Plan 1. Chronic systolic congestive heart failure (HCC) Stable, no increase in shortness of breath, swelling or chest pains. Cont on lasix 40 mg daily.  -low sodium diet encouraged, information given and to talk with staff at [facility] about food choices.  -cont compression hose.  - Ambulatory referral to Physical Therapy  2. Weakness -decrease mobility with sedentary lifestyle.  - Ambulatory referral to Physical Therapy for gait and strength training.   3. Cyst, baker's knee, left Stable, no increase in pain at this time. Will monitor.   4. Need for immunization against influenza - Flu vaccine HIGH DOSE PF (Fluzone High dose)  Next appt: as scheduled.  Carlos American. Harle Battiest  [facility] [phone] 8 am - 5 pm) [phone] (after hours)

## 2017-05-21 NOTE — Telephone Encounter (Signed)
Pt saw PCP yesterday

## 2017-05-21 NOTE — Telephone Encounter (Signed)
Needs to see PCP

## 2017-05-25 ENCOUNTER — Telehealth: Payer: Self-pay | Admitting: *Deleted

## 2017-05-25 DIAGNOSIS — M7122 Synovial cyst of popliteal space [Baker], left knee: Secondary | ICD-10-CM

## 2017-05-25 DIAGNOSIS — C320 Malignant neoplasm of glottis: Secondary | ICD-10-CM | POA: Diagnosis not present

## 2017-05-25 DIAGNOSIS — Z9002 Acquired absence of larynx: Secondary | ICD-10-CM | POA: Diagnosis not present

## 2017-05-25 NOTE — Telephone Encounter (Signed)
Order placed

## 2017-05-25 NOTE — Telephone Encounter (Signed)
Okay to place referral to [facility] for evaluation (and drainage if it meets criteria)  of bakers cyst

## 2017-05-25 NOTE — Telephone Encounter (Signed)
Timothy Suarez, daughter called and stated that patient has to get his Left Knee Baker's Cyst drained every so often and she stated that patient is needing a referral to have it drained now because it is painful. Would like a referral placed to have this done at [facility] [place] ([place] Please Advise.

## 2017-05-27 ENCOUNTER — Ambulatory Visit
Admission: RE | Admit: 2017-05-27 | Discharge: 2017-05-27 | Disposition: A | Discharge status: Home / Self Care | Payer: Medicare Other | Source: Ambulatory Visit | Attending: Nurse Practitioner | Admitting: Nurse Practitioner

## 2017-05-27 DIAGNOSIS — M7122 Synovial cyst of popliteal space [Baker], left knee: Secondary | ICD-10-CM

## 2017-06-02 ENCOUNTER — Encounter: Payer: Self-pay | Admitting: Cardiology

## 2017-06-02 ENCOUNTER — Ambulatory Visit (INDEPENDENT_AMBULATORY_CARE_PROVIDER_SITE_OTHER): Payer: Medicare Other | Admitting: Cardiology

## 2017-06-02 VITALS — BP 118/60 | HR 89 | Ht 72.0 in | Wt 179.0 lb

## 2017-06-02 DIAGNOSIS — R29898 Other symptoms and signs involving the musculoskeletal system: Secondary | ICD-10-CM

## 2017-06-02 DIAGNOSIS — I10 Essential (primary) hypertension: Secondary | ICD-10-CM | POA: Diagnosis not present

## 2017-06-02 DIAGNOSIS — I420 Dilated cardiomyopathy: Secondary | ICD-10-CM

## 2017-06-02 DIAGNOSIS — I480 Paroxysmal atrial fibrillation: Secondary | ICD-10-CM | POA: Diagnosis not present

## 2017-06-02 NOTE — Patient Instructions (Signed)
Medication Instructions: Your physician recommends that you continue on your current medications as directed. Please refer to the Current Medication list given to you today.  Labwork: None Ordered  Procedures/Testing: None Ordered  Follow-Up: Your physician recommends that you schedule a follow-up appointment in January 2019 with Dr. Radford Pax.     If you need a refill on your cardiac medications before your next appointment, please call your pharmacy.

## 2017-06-02 NOTE — Progress Notes (Signed)
06/02/2017 Timothy Suarez   12-May-1925  [id number]  Primary Physician Gayland Curry, DO Primary Cardiologist: Dr. Radford Pax    Reason for Visit/CC: Weakness and Fatigue   HPI:  Timothy Suarez is a 83 y.o. male, followed by Dr. Radford Pax, who presents to clinic for symptoms of fatigue and weakness.   He has a h/o PAF and MVP with severe MR status post right minithoracotomy with MVP repair and Maze procedure 12/2008, chest wall hernia at thoracotomy site status post repair, hypertension, nonobstructive CAD (50-70% LAD in the past), and colon cancer. Previous LVEF of 45% but repeat 2-D echo in 2016 showed normal LVEF 50-55% with septal hypokinesis, grade 1 DD. Myoview was negative for ischemia in 2016.  He was admitted back in the Spring, 12/2016, with acute CHF and treated with IV diuretics. F/u echo 01/02/17 showed severely reduced LV function EF 25-30% with diffuse hypokinesis, mild to moderate aortic regurgitation, mild MR. Cardiology not consulted that admit. Pt last seen by our practice 04/2017. He had clinic visit with Ermalinda Barrios, PA. He was felt to be stable from a volume standpoint. However medical management of systolic HF limited by renal insuffiencey and soft BP. He is not on an ACE/ARB, spironolactone nor BBs for these reasons. He was instructed to f/u with Dr. Radford Pax first available.   He also has a h/o throat cancer and is s/p  Laryngectomy. He lives in an independent living facility. He gets around well and ambulates with a cane and at times a rolling walker.   Pt was added to my scheduled given development of weakness and fatigue. His daughter called the office and requested an appt.   He is here with his daughter. His weakness is localize to his bilateral lower extremities. He also notes gait/ balance issues. His PCP has ordered PT, which is scheduled to begin next week. He denies any generalized weakness or fatigue. No dyspnea, palpitations, lightheadeniess, syncope/ near  syncope. No CP. VSS and physical exam unremarkable for acute cardiac issues. EKG with PVCs, which he has a h/o.    No outpatient prescriptions have been marked as taking for the 06/02/17 encounter (Office Visit) with Consuelo Pandy, PA-C.   Allergies  Allergen Reactions  . Citalopram Nausea Only  . Trazodone And Nefazodone Other (See Comments)    Dry mouth  . Zoloft [Sertraline Hcl] Other (See Comments)    dizzy   Past Medical History:  Diagnosis Date  . A-fib ([place])   . Aortic valve insufficiency, acquired   . Arthritis   . BPH (benign prostatic hyperplasia)   . Cerebral ischemia   . Colon cancer [facility])     s/p partial colectomy  . Complication of anesthesia    "woke up w/confusion and hallucinations once after mitral valve OR"  . Coronary artery disease    50-70% LAD mitral valve repair 4+ yrs ago  . Depression   . Dyslipidemia   . Glaucoma   . Heart murmur   . Hernia   . High cholesterol   . History of echocardiogram    Echo 5/16:  Mild LVH, EF 50-55%, Gr 1 DD, septal HK, Ao sclerosis without stenosis, mild AI, MV repair ok with borderline mild MS (mean 4 mmHg), mild MR, mild LAE, mod RAE, mild TR, PASP 30 mmHg  . HTN (hypertension)    takes Amlodipine daily  . Hx of cardiovascular stress test    Lexiscan Myoview 5/16:  Apical thinning, EF not gated, no ischemia.  Low Risk  . Hyperlipidemia   . Major depression   . Mild cognitive impairment   . MVP (mitral valve prolapse)    S/P Rt mini thoractomy for Mitral Valve repair  . Nocturia   . Paroxysmal atrial fibrillation ([facility])    S/P Maze procedure  . 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 Prostate cancer ([place])   . 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 Shortness of breath dyspnea    with exertion  . Sleep disturbance   . Thoracic aortic aneurysm ([place])   . Urinary frequency   . Urinary urgency    Family History  Problem Relation Age  of Onset  . Cancer Mother        lymphoma  . Cancer Father        pancreatic  . Heart attack Neg Hx   . Stroke Neg Hx    Past Surgical History:  Procedure Laterality Date  . CARDIAC CATHETERIZATION    . CATARACT EXTRACTION W/ INTRAOCULAR LENS  IMPLANT, BILATERAL Bilateral   . COLECTOMY  2004   Dr [name]  . DIRECT LARYNGOSCOPY N/A 01/15/2016   Procedure: DIRECT LARYNGOSCOPY WITH BIOPSY AND FROZEN SECTION;  Surgeon: Izora Gala, MD;  Location: [place];  Service: ENT;  Laterality: N/A;  . FlexHD patch repair of chest wall hernia.  01/28/2011   [name]  . GASTROSTOMY W/ FEEDING TUBE    . IR GENERIC HISTORICAL  10/08/2016   IR GASTROSTOMY TUBE REMOVAL 10/08/2016 Sandi Mariscal, MD [facility]-INTER[facility] RAD  . JOINT REPLACEMENT    . LARYNGETOMY N/A 01/15/2016   Procedure:  TOTAL LARYNGECTOMY;  Surgeon: Izora Gala, MD;  Location: [facility] OR;  Service: ENT;  Laterality: N/A;  . MAZE  12/27/2008   left side lesion set  . MICROLARYNGOSCOPY Right 01/29/2015   Procedure: MICROLARYNGOSCOPY WITH BIOSPY OF RIGHT VOCAL CORD;  Surgeon: Izora Gala, MD;  Location: [place];  Service: ENT;  Laterality: Right;  . MITRAL VALVE REPAIR  12/27/2008   complex valvuloplasty with 62mm Memo 3D annuloplasty ring via right minithoracotomy  . PECTORALIS FLAP Left 05/04/2016   Procedure: PECTORALIS FLAP to neck with possible, split thickness skin graft;  Surgeon: Loel Lofty Dillingham, DO;  Location: [place];  Service: Plastics;  Laterality: Left;  . PROSTATE BIOPSY    . SKIN SPLIT GRAFT Left 05/04/2016   Procedure: PECTORALIS MAJOR MYOCUTANEOUS FLAP RECONSTRUCTION OF PHARYNX AND SPLIT THICKNESS SKIN GRAFT;  Surgeon: Izora Gala, MD;  Location: [place];  Service: ENT;  Laterality: Left;  . TEE WITHOUT CARDIOVERSION N/A 01/03/2013   Procedure: TRANSESOPHAGEAL ECHOCARDIOGRAM (TEE);  Surgeon: Sueanne Margarita, MD;  Location: [place];  Service: Cardiovascular;  Laterality: N/A;  . TOTAL KNEE ARTHROPLASTY Right 2003  . TRACHEAL ESOPHAGEAL PUNCTURE REPAIR N/A  03/26/2016   Procedure: TRACHEAL ESOPHAGEAL PUNCTURE;  Surgeon: Izora Gala, MD;  Location: [facility] OR;  Service: ENT;  Laterality: N/A;  . TRACHEOESOPHAGEAL FISTULA REPAIR N/A 03/26/2016   Procedure: TRACHEO-ESOPHAGEAL   PUNCTURE,FISTULAR CLOSURE;  Surgeon: Izora Gala, MD;  Location: [facility] OR;  Service: ENT;  Laterality: N/A;  . TRACHEOESOPHAGEAL FISTULA REPAIR N/A 04/09/2016   Procedure: FISTULA REPAIR WITH  MUSCLE ROTATION FLAP;  Surgeon: Izora Gala, MD;  Location: [place];  Service: ENT;  Laterality: N/A;  . TRACHEOESOPHAGEAL FISTULA REPAIR N/A 07/27/2016   Procedure: CLOSURE OF FISTULA;  Surgeon: Izora Gala, MD;  Location: [place];  Service: ENT;  Laterality: N/A;  .  TYMPANOPLASTY  1967   "? side"   Social History   Social History  . Marital status: Widowed    Spouse name: N/A  . Number of children: 2  . Years of education: 14   Occupational History  . retired Nutritional therapist     Social History Main Topics  . Smoking status: Never Smoker  . Smokeless tobacco: Never Used  . Alcohol use 0.0 oz/week     Comment: rare  . Drug use: No  . Sexual activity: No   Other Topics Concern  . Not on file   Social History Narrative   Widowed   Never smoked   Alcohol  Occasionally    Exercise - walking the [employer] with cane   [place], HCPOA                 Review of Systems: General: negative for chills, fever, night sweats or weight changes.  Cardiovascular: negative for chest pain, dyspnea on exertion, edema, orthopnea, palpitations, paroxysmal nocturnal dyspnea or shortness of breath Dermatological: negative for rash Respiratory: negative for cough or wheezing Urologic: negative for hematuria Abdominal: negative for nausea, vomiting, diarrhea, bright red blood per rectum, melena, or hematemesis Neurologic: negative for visual changes, syncope, or dizziness All other systems reviewed and are otherwise negative except as noted above.   Physical Exam:  Blood  pressure 118/60, pulse 89, height 6' (1.829 m), weight 179 lb (81.2 kg), SpO2 99 %.  General appearance: alert, cooperative and no distress Neck: no carotid bruit and no JVD Lungs: clear to auscultation bilaterally Heart: regular rate and rhythm, S1, S2 normal, no murmur, click, rub or gallop Extremities: trace bilateral LEE, wearing compression stockings Pulses: 2+ and symmetric Skin: Skin color, texture, turgor normal. No rashes or lesions Neurologic: Grossly normal  EKG NSR with PVCs -- personally reviewed   ASSESSMENT AND PLAN:   Pt was added on to my schedule after daughter requested appt given loclized bilateral LEE weakness. He denies generalized weakness, no fatigue, dyspnea or chest pain. VSS. EKG shows SR with PVCs, which he has a h/o. He has not be on a BB given soft BP. His physical exam is unremarkable for any acute cardiac issues. He seems to be stable from a cardiac standpoint, given his age and cardiac history.  Pt and his daughter were reassured. I agree with his PCP that he would benefit from PT for strength exercises and gait therapy. Continue current regimen. F/u with Dr. Radford Pax as previously directed. He is due back in 3 months. He can f/u sooner if any new cardiac symptoms.    Seamus Warehime Ladoris Gene, MHS [facility] 06/02/2017 12:50 PM

## 2017-06-07 ENCOUNTER — Encounter: Payer: Self-pay | Admitting: Physical Therapy

## 2017-06-07 ENCOUNTER — Ambulatory Visit: Payer: Medicare Other | Attending: Nurse Practitioner | Admitting: Physical Therapy

## 2017-06-07 DIAGNOSIS — G8929 Other chronic pain: Secondary | ICD-10-CM | POA: Insufficient documentation

## 2017-06-07 DIAGNOSIS — M25562 Pain in left knee: Secondary | ICD-10-CM | POA: Insufficient documentation

## 2017-06-07 DIAGNOSIS — R2689 Other abnormalities of gait and mobility: Secondary | ICD-10-CM | POA: Diagnosis not present

## 2017-06-07 DIAGNOSIS — M6281 Muscle weakness (generalized): Secondary | ICD-10-CM | POA: Insufficient documentation

## 2017-06-07 DIAGNOSIS — R2681 Unsteadiness on feet: Secondary | ICD-10-CM | POA: Insufficient documentation

## 2017-06-07 NOTE — Therapy (Signed)
[facility] [address], [place], [place], [address] Phone: [phone]   Fax:  [phone]  Physical Therapy Evaluation  Patient Details  Name: Timothy Suarez MRN: [id number] Date of Birth: 02-10-1925 Referring Provider: Sherrie Mustache, MD   Encounter Date: 06/07/2017      PT End of Session - 06/07/17 1213    Visit Number 1   Number of Visits 16   Date for PT Re-Evaluation 08/02/17   Authorization Type [employer] Medicare    Authorization Time Period 06/07/17 to 08/02/17   Authorization - Visit Number 1   Authorization - Number of Visits 10   PT Start Time 1102   PT Stop Time 1146   PT Time Calculation (min) 44 min   Activity Tolerance Patient tolerated treatment well   Behavior During Therapy [facility] for tasks assessed/performed      Past Medical History:  Diagnosis Date  . A-fib ([place])   . Aortic valve insufficiency, acquired   . Arthritis   . BPH (benign prostatic hyperplasia)   . Cerebral ischemia   . Colon cancer [facility])     s/p partial colectomy  . Complication of anesthesia    "woke up w/confusion and hallucinations once after mitral valve OR"  . Coronary artery disease    50-70% LAD mitral valve repair 4+ yrs ago  . Depression   . Dyslipidemia   . Glaucoma   . Heart murmur   . Hernia   . High cholesterol   . History of echocardiogram    Echo 5/16:  Mild LVH, EF 50-55%, Gr 1 DD, septal HK, Ao sclerosis without stenosis, mild AI, MV repair ok with borderline mild MS (mean 4 mmHg), mild MR, mild LAE, mod RAE, mild TR, PASP 30 mmHg  . HTN (hypertension)    takes Amlodipine daily  . Hx of cardiovascular stress test    Lexiscan Myoview 5/16:  Apical thinning, EF not gated, no ischemia. Low Risk  . Hyperlipidemia   . Major depression   . Mild cognitive impairment   . MVP (mitral valve prolapse)    S/P Rt mini thoractomy for Mitral Valve repair  . Nocturia   . Paroxysmal atrial fibrillation (HCC)     S/P Maze procedure  . 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 Prostate cancer ([place])   . 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 Shortness of breath dyspnea    with exertion  . Sleep disturbance   . Thoracic aortic aneurysm ([place])   . Urinary frequency   . Urinary urgency     Past Surgical History:  Procedure Laterality Date  . CARDIAC CATHETERIZATION    . CATARACT EXTRACTION W/ INTRAOCULAR LENS  IMPLANT, BILATERAL Bilateral   . COLECTOMY  2004   Dr [name]  . DIRECT LARYNGOSCOPY N/A 01/15/2016   Procedure: DIRECT LARYNGOSCOPY WITH BIOPSY AND FROZEN SECTION;  Surgeon: Izora Gala, MD;  Location: [place];  Service: ENT;  Laterality: N/A;  . FlexHD patch repair of chest wall hernia.  01/28/2011   [name]  . GASTROSTOMY W/ FEEDING TUBE    . IR GENERIC HISTORICAL  10/08/2016   IR GASTROSTOMY TUBE REMOVAL 10/08/2016 Sandi Mariscal, MD [facility]-INTERV RAD  . JOINT REPLACEMENT    . LARYNGETOMY N/A 01/15/2016   Procedure:  TOTAL LARYNGECTOMY;  Surgeon: Izora Gala, MD;  Location: [place];  Service: ENT;  Laterality: N/A;  . MAZE  12/27/2008  left side lesion set  . MICROLARYNGOSCOPY Right 01/29/2015   Procedure: MICROLARYNGOSCOPY WITH BIOSPY OF RIGHT VOCAL CORD;  Surgeon: Izora Gala, MD;  Location: [place];  Service: ENT;  Laterality: Right;  . MITRAL VALVE REPAIR  12/27/2008   complex valvuloplasty with 50mm Memo 3D annuloplasty ring via right minithoracotomy  . PECTORALIS FLAP Left 05/04/2016   Procedure: PECTORALIS FLAP to neck with possible, split thickness skin graft;  Surgeon: Loel Lofty Dillingham, DO;  Location: [place];  Service: Plastics;  Laterality: Left;  . PROSTATE BIOPSY    . SKIN SPLIT GRAFT Left 05/04/2016   Procedure: PECTORALIS MAJOR MYOCUTANEOUS FLAP RECONSTRUCTION OF PHARYNX AND SPLIT THICKNESS SKIN GRAFT;  Surgeon: Izora Gala, MD;  Location: [place];  Service: ENT;  Laterality: Left;  . TEE WITHOUT CARDIOVERSION N/A 01/03/2013    Procedure: TRANSESOPHAGEAL ECHOCARDIOGRAM (TEE);  Surgeon: Sueanne Margarita, MD;  Location: [place];  Service: Cardiovascular;  Laterality: N/A;  . TOTAL KNEE ARTHROPLASTY Right 2003  . TRACHEAL ESOPHAGEAL PUNCTURE REPAIR N/A 03/26/2016   Procedure: TRACHEAL ESOPHAGEAL PUNCTURE;  Surgeon: Izora Gala, MD;  Location: [facility] OR;  Service: ENT;  Laterality: N/A;  . TRACHEOESOPHAGEAL FISTULA REPAIR N/A 03/26/2016   Procedure: TRACHEO-ESOPHAGEAL   PUNCTURE,FISTULAR CLOSURE;  Surgeon: Izora Gala, MD;  Location: [place];  Service: ENT;  Laterality: N/A;  . TRACHEOESOPHAGEAL FISTULA REPAIR N/A 04/09/2016   Procedure: FISTULA REPAIR WITH  MUSCLE ROTATION FLAP;  Surgeon: Izora Gala, MD;  Location: [place];  Service: ENT;  Laterality: N/A;  . TRACHEOESOPHAGEAL FISTULA REPAIR N/A 07/27/2016   Procedure: CLOSURE OF FISTULA;  Surgeon: Izora Gala, MD;  Location: [place];  Service: ENT;  Laterality: N/A;  . TYMPANOPLASTY  1967   "? side"    There were no vitals filed for this visit.       Subjective Assessment - 06/07/17 1112    Subjective Pt and his daughter report that over the past year or so his legs started getting drastically weak. He had several surgeries and CHF diagnosis, in addition to Lt knee pain which resulted in inactivity. He saw his PCP who recommended PT to improve his strength, balance and mobility.    Patient is accompained by: Family member   Pertinent History trachiectomy, a-fib, CHF, Rt TKA in 2003, thoracic aortic aneurysm   Limitations Walking;House hold activities   Patient Stated Goals improve mobility and strength.    Currently in Pain? No/denies            [facility] PT Assessment - 06/07/17 0001      Assessment   Medical Diagnosis Generalized weakness    Referring Provider Sherrie Mustache, MD    Prior Therapy none      Precautions   Precautions None     Balance Screen   Has the patient fallen in the past 6 months Yes   How many times? 3  tripped over things in the house     Has the patient had a decrease in activity level because of a fear of falling?  Yes   Is the patient reluctant to leave their home because of a fear of falling?  Yes     Home Environment   Additional Comments lives in assisted living facility      Cognition   Overall Cognitive Status Within Functional Limits for tasks assessed     Observation/Other Assessments   Observations uses device to communicate      ROM / Strength   AROM / PROM / Strength  Strength     Strength   Strength Assessment Site Hip;Knee;Ankle   Right/Left Hip Right;Left   Right Hip Flexion 4/5   Right Hip ABduction 3+/5   Left Hip Flexion 4/5   Left Hip ABduction 3+/5   Right/Left Knee Right;Left   Right Knee Extension 5/5   Left Knee Extension 5/5   Right/Left Ankle Left;Right   Right Ankle Dorsiflexion 5/5   Left Ankle Dorsiflexion 5/5     Flexibility   Soft Tissue Assessment /Muscle Length yes   Hamstrings 50% limited   Quadriceps unable to test      Palpation   Palpation comment tenderness Lt knee jt line      Transfers   Five time sit to stand comments  26.6 sec, UE on thighs      Ambulation/Gait   Gait Comments Pt ambulating with rollator, flexed posture, decreased step length, decreased hip extension      Standardized Balance Assessment   Standardized Balance Assessment Berg Balance Test;Timed Up and Go Test     Berg Balance Test   Sit to Stand Able to stand using hands after several tries   Standing Unsupported Able to stand safely 2 minutes   Sitting with Back Unsupported but Feet Supported on Floor or Stool Able to sit safely and securely 2 minutes   Stand to Sit Sits safely with minimal use of hands   Transfers Able to transfer safely, definite need of hands   Standing Unsupported with Eyes Closed Able to stand 10 seconds with supervision   Standing Ubsupported with Feet Together Able to place feet together independently and stand for 1 minute with supervision   From Standing, Reach  Forward with Outstretched Arm Can reach forward >12 cm safely (5")   From Standing Position, Pick up Object from Floor Able to pick up shoe safely and easily   From Standing Position, Turn to Look Behind Over each Shoulder Turn sideways only but maintains balance   Turn 360 Degrees Able to turn 360 degrees safely but slowly   Standing Unsupported, Alternately Place Feet on Step/Stool Able to complete >2 steps/needs minimal assist   Standing Unsupported, One Foot in Front Able to take small step independently and hold 30 seconds   Standing on One Leg Tries to lift leg/unable to hold 3 seconds but remains standing independently   Total Score 38     Timed Up and Go Test   TUG Comments 26 sec, using rollator             Objective measurements completed on examination: See above findings.                  PT Education - 06/07/17 1204    Education provided Yes   Education Details eval findings/POC; results of functional testing/balance testing    Person(s) Educated Patient;Child(ren)   Methods Explanation   Comprehension Verbalized understanding          PT Short Term Goals - 06/07/17 1241      PT SHORT TERM GOAL #1   Title Pt will demo consistency and independence with his HEP.    Time 4   Period Weeks   Status New   Target Date 07/05/17     PT SHORT TERM GOAL #2   Title Pt will demo improved tandem balance to atleast 20 sec with each LE forward, 2/3 trials, to improve safety with walking and other mobility during the day.    Time 4  Period Weeks   Status New     PT SHORT TERM GOAL #3   Title Pt will demo improved safety with transfers evident by his ability to complete sit<> stand and no more than UE support on his thighs, during a full session.    Time 4   Period Weeks   Status New     PT SHORT TERM GOAL #4   Title Pt will demo improved awareness of his environment evident by his ability to ambulate throughout the clinic without the need for verbal  cues for negotiating obstacles.    Time 4   Period Weeks   Status New           PT Long Term Goals - 06/07/17 1245      PT LONG TERM GOAL #1   Title Pt will demo independence with his advanced HEP to allow for transition into regular use of his facility's equipment at discharge.    Time 8   Period Weeks   Status New   Target Date 08/02/17     PT LONG TERM GOAL #2   Title Pt will demo atleast 6 point improvement in his Berg balance score to reflect a decreased risk of falling at home.    Time 8   Period Weeks   Status New     PT LONG TERM GOAL #3   Title Pt will complete 5x sit to stand in less than 20 sec, with no more than UE support on his thighs, to reflect and improvement in his functional strength and endurance.    Time 8   Period Weeks   Status New     PT LONG TERM GOAL #4   Title Pt will reduce his TUG score to less than or equal to 18 sec, which will reflect and improvement in his functional balance and safety with mobility in the community.    Time 8   Period Weeks   Status New     PT LONG TERM GOAL #5   Title Pt and his caregiver will report atleast a 40% improvement in his mobility and balance at home, to increase his overall safety and decrease risk of falling.    Time 8   Period Weeks   Status New                Plan - 06/07/17 1251    Clinical Impression Statement Pt is a pleasant 83 y.o M referred to OPPT with concerns of progressing LE weakness and poor mobility/balance over the last year. He underwent several procedures resulting in a periods of inactivity, also reporting 2-3 falls over the past couple of months. Prior to today's evaluation, the pt even reports having fallen while walking his dog, although no headache, other mention of pain when asked. Pt demonstrates limitations in hip flexibility and strength, evident by MMT and his performance on the 5x sit to stand and scored 38/56 on the Berg balance test placing his at a high risk of  falling. He currently lives at an assisted living facility and would benefit from skilled PT to address his limitations in strength, flexibility, balance and endurance in order to decrease his falls and improve his safety.    History and Personal Factors relevant to plan of care: multiple comorbidities, history of multiple falls over the past several months   Clinical Presentation Evolving   Clinical Presentation due to: worsening strength, balance and mobility   Clinical Decision Making Moderate   Rehab Potential  Good   Clinical Impairments Affecting Rehab Potential (-) comorbidities, (+) pt has good family support   PT Frequency 2x / week   PT Duration 8 weeks   PT Treatment/Interventions ADLs/Self Care Home Management;Cryotherapy;Balance training;Therapeutic exercise;Stair training;Gait training;Therapeutic activities;Functional mobility training;Neuromuscular re-education;Patient/family education;Passive range of motion;Manual techniques   PT Next Visit Plan provide HEP to address hip flexibility and strength, progression of hip strengthening, balance activity   PT Home Exercise Plan unable to provide at eval, needs HEP 2nd visit   Consulted and Agree with Plan of Care Family member/caregiver;Patient   Family Member Consulted Pt's daughter       Patient will benefit from skilled therapeutic intervention in order to improve the following deficits and impairments:  Abnormal gait, Decreased balance, Decreased activity tolerance, Pain, Postural dysfunction, Decreased mobility, Decreased coordination, Decreased endurance, Decreased range of motion, Decreased strength, Impaired flexibility, Difficulty walking, Decreased safety awareness  Visit Diagnosis: Other abnormalities of gait and mobility  Muscle weakness (generalized)  Chronic pain of left knee  Unsteadiness on feet      G-Codes - 06/17/17 1240    Functional Assessment Tool Used (Outpatient Only) clinical judgement based on  assessment of MMT, TUG, berg balance and 5x sit to stand    Functional Limitation Mobility: Walking and moving around   Mobility: Walking and Moving Around Current Status [phone]) At least 60 percent but less than 80 percent impaired, limited or restricted   Mobility: Walking and Moving Around Goal Status [phone]) At least 40 percent but less than 60 percent impaired, limited or restricted       Problem List Patient Active Problem List   Diagnosis Date Noted  . Dilated cardiomyopathy ([place]) 04/19/2017  . Chronic systolic congestive heart failure ([place]) 03/30/2017  . Cough   . Pulmonary edema 12/06/2016  . H/O laryngectomy 03/26/2016  . Protein-calorie malnutrition, severe 02/26/2016  . Pharyngocutaneous fistula 02/21/2016  . S/P laryngectomy 01/15/2016  . S/P biopsy 01/15/2016  . Baker's cyst of knee 12/09/2015  . Other fatigue 12/09/2015  . 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 Prostate cancer ([place]) 01/10/2015  . S/P MVR (mitral valve repair) 12/20/2014  . S/P Maze operation for atrial fibrillation 12/20/2014  . Obesity (BMI 30-39.9) 04/02/2014  . Prolonged Q-T interval on ECG 04/02/2014  . Mild cognitive impairment 04/02/2014  . Major depressive disorder, recurrent episode, mild ([place]) 02/08/2014  . Insomnia 02/08/2014  . Dizziness and giddiness 02/08/2014  . Generalized muscle weakness 02/08/2014  . Cerumen impaction 02/08/2014  . Glaucoma 02/08/2014  . Colon cancer ([place])   . Coronary artery disease   . Moderate aortic insufficiency 01/03/2013  . Mild mitral regurgitation 01/03/2013  . MVP (mitral valve prolapse)   . Paroxysmal atrial fibrillation (HCC)   . HTN (hypertension)   . Depression   . chest wall hernia (lung hernia)     1:21 PM,06/17/17 Elly Modena PT, DPT [facility] [address] [place], [place], [address] Phone: [phone]   Fax:  [phone]  Name: Timothy Suarez MRN: [id number] Date of Birth: [birth year]

## 2017-06-10 ENCOUNTER — Ambulatory Visit: Payer: Medicare Other | Attending: Nurse Practitioner

## 2017-06-10 DIAGNOSIS — M25562 Pain in left knee: Secondary | ICD-10-CM | POA: Insufficient documentation

## 2017-06-10 DIAGNOSIS — M6281 Muscle weakness (generalized): Secondary | ICD-10-CM | POA: Insufficient documentation

## 2017-06-10 DIAGNOSIS — R2689 Other abnormalities of gait and mobility: Secondary | ICD-10-CM | POA: Insufficient documentation

## 2017-06-10 DIAGNOSIS — R2681 Unsteadiness on feet: Secondary | ICD-10-CM | POA: Diagnosis not present

## 2017-06-10 DIAGNOSIS — G8929 Other chronic pain: Secondary | ICD-10-CM | POA: Insufficient documentation

## 2017-06-10 NOTE — Patient Instructions (Addendum)
KNEE: Extension, Long Arc Quad (Weight)  Place weight around leg. Raise leg until knee is straight. Hold _5__ seconds. Use ___ lb weight. _10__ reps per set (each leg), 4-5__ sets per day, __7_ days per week  Copyright  VHI. All rights reserved.   Adduction: Hip - Knees Together (Sitting)    Sit with towel roll between knees. Push knees together. Hold for _5__ seconds. Rest for ___ seconds. Repeat ___ times. Do ___ times a day.  Copyright  VHI. All rights reserved.       Knee Raise   Lift knee and then lower it. Repeat with other knee. Repeat _10__ times each leg. Do _4-5___ sessions per day.  http://gt2.exer.us/445   Copyright  VHI. All rights reserved.  Toe Up   Gently rise up on toes and back on heels. Repeat _20___ times. Do 4-5____ sessions per day.  HIP: Hamstrings - Short Sitting    Rest leg on raised surface. Keep knee straight. Lift chest. Hold _20__ seconds. _3__ reps per set, _3__ sets per day, ___ days per week  Copyright  VHI. All rights reserved.   [facility] [address] [place], [place] [address] Phone # [phone] Fax [phone]

## 2017-06-10 NOTE — Therapy (Signed)
[facility] [address], [place], [place], [address] Phone: [phone]   Fax:  [phone]  Physical Therapy Treatment  Patient Details  Name: Timothy Suarez MRN: [id number] Date of Birth: [birth year] Referring Provider: Sherrie Mustache, MD   Encounter Date: 06/10/2017      PT End of Session - 06/10/17 1106    Visit Number 2   Number of Visits 10   Date for PT Re-Evaluation 08/02/17   Authorization Type U[employer] Medicare    Authorization - Visit Number 2   Authorization - Number of Visits 10   PT Start Time 3295   PT Stop Time 1059   PT Time Calculation (min) 41 min   Behavior During Therapy [facility] for tasks assessed/performed      Past Medical History:  Diagnosis Date  . A-fib ([place])   . Aortic valve insufficiency, acquired   . Arthritis   . BPH (benign prostatic hyperplasia)   . Cerebral ischemia   . Colon cancer [facility])     s/p partial colectomy  . Complication of anesthesia    "woke up w/confusion and hallucinations once after mitral valve OR"  . Coronary artery disease    50-70% LAD mitral valve repair 4+ yrs ago  . Depression   . Dyslipidemia   . Glaucoma   . Heart murmur   . Hernia   . High cholesterol   . History of echocardiogram    Echo 5/16:  Mild LVH, EF 50-55%, Gr 1 DD, septal HK, Ao sclerosis without stenosis, mild AI, MV repair ok with borderline mild MS (mean 4 mmHg), mild MR, mild LAE, mod RAE, mild TR, PASP 30 mmHg  . HTN (hypertension)    takes Amlodipine daily  . Hx of cardiovascular stress test    Lexiscan Myoview 5/16:  Apical thinning, EF not gated, no ischemia. Low Risk  . Hyperlipidemia   . Major depression   . Mild cognitive impairment   . MVP (mitral valve prolapse)    S/P Rt mini thoractomy for Mitral Valve repair  . Nocturia   . Paroxysmal atrial fibrillation (HCC)    S/P Maze procedure  . 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  Prostate cancer ([place])   . 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 Shortness of breath dyspnea    with exertion  . Sleep disturbance   . Thoracic aortic aneurysm ([place])   . Urinary frequency   . Urinary urgency     Past Surgical History:  Procedure Laterality Date  . CARDIAC CATHETERIZATION    . CATARACT EXTRACTION W/ INTRAOCULAR LENS  IMPLANT, BILATERAL Bilateral   . COLECTOMY  2004   Dr [name]  . DIRECT LARYNGOSCOPY N/A 01/15/2016   Procedure: DIRECT LARYNGOSCOPY WITH BIOPSY AND FROZEN SECTION;  Surgeon: Izora Gala, MD;  Location: [place];  Service: ENT;  Laterality: N/A;  . FlexHD patch repair of chest wall hernia.  01/28/2011   [name]  . GASTROSTOMY W/ FEEDING TUBE    . IR GENERIC HISTORICAL  10/08/2016   IR GASTROSTOMY TUBE REMOVAL 10/08/2016 Sandi Mariscal, MD [facility]-INTER[facility] RAD  . JOINT REPLACEMENT    . LARYNGETOMY N/A 01/15/2016   Procedure:  TOTAL LARYNGECTOMY;  Surgeon: Izora Gala, MD;  Location: [facility] OR;  Service: ENT;  Laterality: N/A;  . MAZE  12/27/2008   left side lesion set  . MICROLARYNGOSCOPY Right 01/29/2015   Procedure: MICROLARYNGOSCOPY WITH  BIOSPY OF RIGHT VOCAL CORD;  Surgeon: Izora Gala, MD;  Location: [place];  Service: ENT;  Laterality: Right;  . MITRAL VALVE REPAIR  12/27/2008   complex valvuloplasty with 55mm Memo 3D annuloplasty ring via right minithoracotomy  . PECTORALIS FLAP Left 05/04/2016   Procedure: PECTORALIS FLAP to neck with possible, split thickness skin graft;  Surgeon: Loel Lofty Dillingham, DO;  Location: [place];  Service: Plastics;  Laterality: Left;  . PROSTATE BIOPSY    . SKIN SPLIT GRAFT Left 05/04/2016   Procedure: PECTORALIS MAJOR MYOCUTANEOUS FLAP RECONSTRUCTION OF PHARYNX AND SPLIT THICKNESS SKIN GRAFT;  Surgeon: Izora Gala, MD;  Location: [place];  Service: ENT;  Laterality: Left;  . TEE WITHOUT CARDIOVERSION N/A 01/03/2013   Procedure: TRANSESOPHAGEAL ECHOCARDIOGRAM (TEE);  Surgeon: Sueanne Margarita, MD;  Location: [place];   Service: Cardiovascular;  Laterality: N/A;  . TOTAL KNEE ARTHROPLASTY Right 2003  . TRACHEAL ESOPHAGEAL PUNCTURE REPAIR N/A 03/26/2016   Procedure: TRACHEAL ESOPHAGEAL PUNCTURE;  Surgeon: Izora Gala, MD;  Location: [facility] OR;  Service: ENT;  Laterality: N/A;  . TRACHEOESOPHAGEAL FISTULA REPAIR N/A 03/26/2016   Procedure: TRACHEO-ESOPHAGEAL   PUNCTURE,FISTULAR CLOSURE;  Surgeon: Izora Gala, MD;  Location: [place];  Service: ENT;  Laterality: N/A;  . TRACHEOESOPHAGEAL FISTULA REPAIR N/A 04/09/2016   Procedure: FISTULA REPAIR WITH  MUSCLE ROTATION FLAP;  Surgeon: Izora Gala, MD;  Location: [place];  Service: ENT;  Laterality: N/A;  . TRACHEOESOPHAGEAL FISTULA REPAIR N/A 07/27/2016   Procedure: CLOSURE OF FISTULA;  Surgeon: Izora Gala, MD;  Location: [place];  Service: ENT;  Laterality: N/A;  . TYMPANOPLASTY  1967   "? side"    There were no vitals filed for this visit.      Subjective Assessment - 06/10/17 1026    Subjective Pt reports that he is stiff in his hips from the fall he had 2 days ago.  I do the Nu[employer] where I live.     Pertinent History trachiectomy, a-fib, CHF, Rt TKA in 2003, thoracic aortic aneurysm   Currently in Pain? No/denies                         [facility] Adult PT Treatment/Exercise - 06/10/17 0001      Exercises   Exercises Knee/Hip;Lumbar     Lumbar Exercises: Stretches   Active Hamstring Stretch 3 reps;20 seconds     Knee/Hip Exercises: Standing   Hip Abduction Stengthening;Both;2 sets;10 reps   Abduction Limitations tactile cues with standing exercise to reduce substitution   Hip Extension Stengthening;Both;2 sets;10 reps     Knee/Hip Exercises: Seated   Long Arc Quad Strengthening;Both;2 sets;10 reps   [employer] 20 reps, 5 second hold   Marching Strengthening;Both;2 sets;10 reps   Abd/Adduction Limitations heel/toe raises                PT Education - 06/10/17 1043    Education provided Yes   Education Details seated LE strength  exercises   Person(s) Educated Patient   Methods Explanation;Demonstration;Handout   Comprehension Verbalized understanding;Returned demonstration          PT Short Term Goals - 06/07/17 1241      PT SHORT TERM GOAL #1   Title Pt will demo consistency and independence with his HEP.    Time 4   Period Weeks   Status New   Target Date 07/05/17     PT SHORT TERM GOAL #2   Title Pt will  demo improved tandem balance to atleast 20 sec with each LE forward, 2/3 trials, to improve safety with walking and other mobility during the day.    Time 4   Period Weeks   Status New     PT SHORT TERM GOAL #3   Title Pt will demo improved safety with transfers evident by his ability to complete sit<> stand and no more than UE support on his thighs, during a full session.    Time 4   Period Weeks   Status New     PT SHORT TERM GOAL #4   Title Pt will demo improved awareness of his environment evident by his ability to ambulate throughout the clinic without the need for verbal cues for negotiating obstacles.    Time 4   Period Weeks   Status New           PT Long Term Goals - 06/07/17 1245      PT LONG TERM GOAL #1   Title Pt will demo independence with his advanced HEP to allow for transition into regular use of his facility's equipment at discharge.    Time 8   Period Weeks   Status New   Target Date 08/02/17     PT LONG TERM GOAL #2   Title Pt will demo atleast 6 point improvement in his Berg balance score to reflect a decreased risk of falling at home.    Time 8   Period Weeks   Status New     PT LONG TERM GOAL #3   Title Pt will complete 5x sit to stand in less than 20 sec, with no more than UE support on his thighs, to reflect and improvement in his functional strength and endurance.    Time 8   Period Weeks   Status New     PT LONG TERM GOAL #4   Title Pt will reduce his TUG score to less than or equal to 18 sec, which will reflect and improvement in his functional  balance and safety with mobility in the community.    Time 8   Period Weeks   Status New     PT LONG TERM GOAL #5   Title Pt and his caregiver will report atleast a 40% improvement in his mobility and balance at home, to increase his overall safety and decrease risk of falling.    Time 8   Period Weeks   Status New               Plan - 06/10/17 1027    Clinical Impression Statement PT issued HEP for LE strength today.  Pt with chronic strength, balance and flexibility deficits.  Pt is at high risk for falls as indicated by testing at evaluation.  Pt requires close supervision for exercises for safety. Pt will continue to benefit from skilled PT for LE strength, endurance, balance, and gait to improve safety and endurance.   Rehab Potential Good   PT Frequency 2x / week   PT Duration 8 weeks   PT Treatment/Interventions ADLs/Self Care Home Management;Cryotherapy;Balance training;Therapeutic exercise;Stair training;Gait training;Therapeutic activities;Functional mobility training;Neuromuscular re-education;Patient/family education;Passive range of motion;Manual techniques   PT Next Visit Plan review HEP for LE strength, continue endurance, gait, LE strength and balance exercises   Consulted and Agree with Plan of Care Patient      Patient will benefit from skilled therapeutic intervention in order to improve the following deficits and impairments:  Abnormal gait, Decreased balance, Decreased activity  tolerance, Pain, Postural dysfunction, Decreased mobility, Decreased coordination, Decreased endurance, Decreased range of motion, Decreased strength, Impaired flexibility, Difficulty walking, Decreased safety awareness  Visit Diagnosis: Muscle weakness (generalized)  Other abnormalities of gait and mobility  Unsteadiness on feet  Chronic pain of left knee     Problem List Patient Active Problem List   Diagnosis Date Noted  . Dilated cardiomyopathy ([place]) 04/19/2017  .  Chronic systolic congestive heart failure ([place]) 03/30/2017  . Cough   . Pulmonary edema 12/06/2016  . H/O laryngectomy 03/26/2016  . Protein-calorie malnutrition, severe 02/26/2016  . Pharyngocutaneous fistula 02/21/2016  . S/P laryngectomy 01/15/2016  . S/P biopsy 01/15/2016  . Baker's cyst of knee 12/09/2015  . Other fatigue 12/09/2015  . 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 Prostate cancer ([place]) 01/10/2015  . S/P MVR (mitral valve repair) 12/20/2014  . S/P Maze operation for atrial fibrillation 12/20/2014  . Obesity (BMI 30-39.9) 04/02/2014  . Prolonged Q-T interval on ECG 04/02/2014  . Mild cognitive impairment 04/02/2014  . Major depressive disorder, recurrent episode, mild ([place]) 02/08/2014  . Insomnia 02/08/2014  . Dizziness and giddiness 02/08/2014  . Generalized muscle weakness 02/08/2014  . Cerumen impaction 02/08/2014  . Glaucoma 02/08/2014  . Colon cancer ([place])   . Coronary artery disease   . Moderate aortic insufficiency 01/03/2013  . Mild mitral regurgitation 01/03/2013  . MVP (mitral valve prolapse)   . Paroxysmal atrial fibrillation (HCC)   . HTN (hypertension)   . Depression   . chest wall hernia (lung hernia)      Sigurd Sos, PT 06/10/17 11:08 AM   [facility] [address] [place], [place], [address] Phone: [phone]   Fax:  [phone]  Name: VERLIN UHER MRN: [id number] Date of Birth: [birth year]

## 2017-06-14 ENCOUNTER — Ambulatory Visit: Payer: Medicare Other | Admitting: Physical Therapy

## 2017-06-14 ENCOUNTER — Encounter: Payer: Self-pay | Admitting: Physical Therapy

## 2017-06-14 DIAGNOSIS — M6281 Muscle weakness (generalized): Secondary | ICD-10-CM | POA: Diagnosis not present

## 2017-06-14 DIAGNOSIS — M25562 Pain in left knee: Secondary | ICD-10-CM

## 2017-06-14 DIAGNOSIS — R2689 Other abnormalities of gait and mobility: Secondary | ICD-10-CM | POA: Diagnosis not present

## 2017-06-14 DIAGNOSIS — R2681 Unsteadiness on feet: Secondary | ICD-10-CM | POA: Diagnosis not present

## 2017-06-14 DIAGNOSIS — G8929 Other chronic pain: Secondary | ICD-10-CM

## 2017-06-14 NOTE — Therapy (Signed)
[facility] [address] [place], [place], [address] Phone: [phone]   Fax:  [phone]  Physical Therapy Treatment  Patient Details  Name: Timothy Suarez MRN: [id number] Date of Birth: 11-20-1924 Referring Provider: Sherrie Mustache, MD    Encounter Date: 06/14/2017  PT End of Session - 06/14/17 0940    Visit Number  3    Number of Visits  10    Date for PT Re-Evaluation  08/02/17    Authorization Type  UHC Medicare     Authorization Time Period  06/07/17 to 08/02/17    Authorization - Visit Number  3    Authorization - Number of Visits  10    PT Start Time  [phone] Computer sign in behind actual start time   Computer sign in behind actual start time   PT Stop Time  1015    PT Time Calculation (min)  38 min    Activity Tolerance  Patient tolerated treatment well    Behavior During Therapy  [facility] for tasks assessed/performed       Past Medical History:  Diagnosis Date  . A-fib ([place])   . Aortic valve insufficiency, acquired   . Arthritis   . BPH (benign prostatic hyperplasia)   . Cerebral ischemia   . Colon cancer [facility])     s/p partial colectomy  . Complication of anesthesia    "woke up w/confusion and hallucinations once after mitral valve OR"  . Coronary artery disease    50-70% LAD mitral valve repair 4+ yrs ago  . Depression   . Dyslipidemia   . Glaucoma   . Heart murmur   . Hernia   . High cholesterol   . History of echocardiogram    Echo 5/16:  Mild LVH, EF 50-55%, Gr 1 DD, septal HK, Ao sclerosis without stenosis, mild AI, MV repair ok with borderline mild MS (mean 4 mmHg), mild MR, mild LAE, mod RAE, mild TR, PASP 30 mmHg  . HTN (hypertension)    takes Amlodipine daily  . Hx of cardiovascular stress test    Lexiscan Myoview 5/16:  Apical thinning, EF not gated, no ischemia. Low Risk  . Hyperlipidemia   . Major depression   . Mild cognitive impairment   . MVP (mitral valve prolapse)    S/P Rt mini thoractomy for Mitral Valve repair  . Nocturia   . Paroxysmal atrial fibrillation (HCC)    S/P Maze procedure  . 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 Prostate cancer ([place])   . 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 Shortness of breath dyspnea    with exertion  . Sleep disturbance   . Thoracic aortic aneurysm ([place])   . Urinary frequency   . Urinary urgency     Past Surgical History:  Procedure Laterality Date  . CARDIAC CATHETERIZATION    . CATARACT EXTRACTION W/ INTRAOCULAR LENS  IMPLANT, BILATERAL Bilateral   . COLECTOMY  2004   Dr [name]  . FlexHD patch repair of chest wall hernia.  01/28/2011   [name]  . GASTROSTOMY W/ FEEDING TUBE    . IR GENERIC HISTORICAL  10/08/2016   IR GASTROSTOMY TUBE REMOVAL 10/08/2016 Sandi Mariscal, MD [facility]INTER[facility] RAD  . JOINT REPLACEMENT    . MAZE  12/27/2008   left side lesion set  . MITRAL VALVE REPAIR  12/27/2008   complex valvuloplasty with 56mm Memo 3D annuloplasty  ring via right minithoracotomy  . PROSTATE BIOPSY    . TOTAL KNEE ARTHROPLASTY Right 2003  . TYMPANOPLASTY  1967   "? side"    There were no vitals filed for this visit.  Subjective Assessment - 06/14/17 0942    Subjective  Pt reports some dizziness, no pain.    Pertinent History  trachiectomy, a-fib, CHF, Rt TKA in 2003, thoracic aortic aneurysm    Patient Stated Goals  improve mobility and strength.     Currently in Pain?  No/denies    Multiple Pain Sites  No                      [facility] Adult PT Treatment/Exercise - 06/14/17 0001      Neuro Re-ed    Neuro Re-ed Details   Tandem stance, SLS, narrow BOS, and marching all with min UE asst and PTA CGA.       Knee/Hip Exercises: Aerobic   Nustep  L1 x 6 min PTA present   PTA present     Knee/Hip Exercises: Standing   Heel Raises  Both;2 sets;10 reps    Hip Abduction  AROM;Stengthening;Both;1 set;10 reps;Knee straight     Abduction Limitations  tactile cues with standing exercise to reduce substitution    Other Standing Knee Exercises  2 laps of gym, 160 feet with and without the cane; CGA, Worked on balance by cuing longer steps/stride  Pt could do significnatly better with cane.    Pt could do significnatly better with cane.      Knee/Hip Exercises: Seated   Long Arc Quad  Strengthening;Both;2 sets;10 reps    [employer]  20 reps, 5 second hold    Clamshell with [employer]ra[employer]  Red 20x   20x   Marching  Strengthening;Both;2 sets;10 reps    Sit to [employer]  10 reps;without UE support 2x5, light CGA, black pad to sit on   2x5, light CGA, black pad to sit on              PT Short Term Goals - 06/07/17 1241      PT SHORT TERM GOAL #1   Title  Pt will demo consistency and independence with his HEP.     Time  4    Period  Weeks    Status  New    Target Date  07/05/17      PT SHORT TERM GOAL #2   Title  Pt will demo improved tandem balance to atleast 20 sec with each LE forward, 2/3 trials, to improve safety with walking and other mobility during the day.     Time  4    Period  Weeks    Status  New      PT SHORT TERM GOAL #3   Title  Pt will demo improved safety with transfers evident by his ability to complete sit<> stand and no more than UE support on his thighs, during a full session.     Time  4    Period  Weeks    Status  New      PT SHORT TERM GOAL #4   Title  Pt will demo improved awareness of his environment evident by his ability to ambulate throughout the clinic without the need for verbal cues for negotiating obstacles.     Time  4    Period  Weeks    Status  New        PT Long Term Goals -  06/07/17 1245      PT LONG TERM GOAL #1   Title  Pt will demo independence with his advanced HEP to allow for transition into regular use of his facility's equipment at discharge.     Time  8    Period  Weeks    Status  New    Target Date  08/02/17      PT LONG TERM GOAL #2   Title  Pt  will demo atleast 6 point improvement in his Berg balance score to reflect a decreased risk of falling at home.     Time  8    Period  Weeks    Status  New      PT LONG TERM GOAL #3   Title  Pt will complete 5x sit to stand in less than 20 sec, with no more than UE support on his thighs, to reflect and improvement in his functional strength and endurance.     Time  8    Period  Weeks    Status  New      PT LONG TERM GOAL #4   Title  Pt will reduce his TUG score to less than or equal to 18 sec, which will reflect and improvement in his functional balance and safety with mobility in the community.     Time  8    Period  Weeks    Status  New      PT LONG TERM GOAL #5   Title  Pt and his caregiver will report atleast a 40% improvement in his mobility and balance at home, to increase his overall safety and decrease risk of falling.     Time  8    Period  Weeks    Status  New            Plan - 06/14/17 1009    Clinical Impression Statement  Pt compliant with HEP and doing teh Nustep. Pt was able to use no UE support to min support for single leg stance ex. Pt could do tandem step/ taking a step forward without UE support. Pt could also perfrom sit to stand without UE, took 2 reps to "find" his legs and balance, did great after that.     Rehab Potential  Good    Clinical Impairments Affecting Rehab Potential  (-) comorbidities, (+) pt has good family support    PT Frequency  2x / week    PT Duration  8 weeks    PT Treatment/Interventions  ADLs/Self Care Home Management;Cryotherapy;Balance training;Therapeutic exercise;Stair training;Gait training;Therapeutic activities;Functional mobility training;Neuromuscular re-education;Patient/family education;Passive range of motion;Manual techniques    PT Next Visit Plan   continue endurance, gait, LE strength and balance exercises    Consulted and Agree with Plan of Care  Patient    Family Member Consulted  Pt's daughter        Patient  will benefit from skilled therapeutic intervention in order to improve the following deficits and impairments:  Abnormal gait, Decreased balance, Decreased activity tolerance, Pain, Postural dysfunction, Decreased mobility, Decreased coordination, Decreased endurance, Decreased range of motion, Decreased strength, Impaired flexibility, Difficulty walking, Decreased safety awareness  Visit Diagnosis: Muscle weakness (generalized)  Other abnormalities of gait and mobility  Unsteadiness on feet  Chronic pain of left knee     Problem List Patient Active Problem List   Diagnosis Date Noted  . Dilated cardiomyopathy ([place]) 04/19/2017  . Chronic systolic congestive heart failure ([place]) 03/30/2017  . Cough   .  Pulmonary edema 12/06/2016  . H/O laryngectomy 03/26/2016  . Protein-calorie malnutrition, severe 02/26/2016  . Pharyngocutaneous fistula 02/21/2016  . S/P laryngectomy 01/15/2016  . S/P biopsy 01/15/2016  . Baker's cyst of knee 12/09/2015  . Other fatigue 12/09/2015  . 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 Prostate cancer ([place]) 01/10/2015  . S/P MVR (mitral valve repair) 12/20/2014  . S/P Maze operation for atrial fibrillation 12/20/2014  . Obesity (BMI 30-39.9) 04/02/2014  . Prolonged Q-T interval on ECG 04/02/2014  . Mild cognitive impairment 04/02/2014  . Major depressive disorder, recurrent episode, mild ([place]) 02/08/2014  . Insomnia 02/08/2014  . Dizziness and giddiness 02/08/2014  . Generalized muscle weakness 02/08/2014  . Cerumen impaction 02/08/2014  . Glaucoma 02/08/2014  . Colon cancer ([place])   . Coronary artery disease   . Moderate aortic insufficiency 01/03/2013  . Mild mitral regurgitation 01/03/2013  . MVP (mitral valve prolapse)   . Paroxysmal atrial fibrillation (HCC)   . HTN (hypertension)   . Depression   . chest wall hernia (lung hernia)     Ciella Obi, PTA 06/14/2017, 10:14 AM  [facility] [address], [place], [place], [address] Phone: [phone]   Fax:  [phone]  Name: KACEY VICUNA MRN: [id number] Date of Birth: 26-Oct-1924

## 2017-06-17 ENCOUNTER — Ambulatory Visit: Payer: Medicare Other

## 2017-06-17 DIAGNOSIS — R2689 Other abnormalities of gait and mobility: Secondary | ICD-10-CM

## 2017-06-17 DIAGNOSIS — G8929 Other chronic pain: Secondary | ICD-10-CM | POA: Diagnosis not present

## 2017-06-17 DIAGNOSIS — R2681 Unsteadiness on feet: Secondary | ICD-10-CM

## 2017-06-17 DIAGNOSIS — M6281 Muscle weakness (generalized): Secondary | ICD-10-CM | POA: Diagnosis not present

## 2017-06-17 DIAGNOSIS — M25562 Pain in left knee: Secondary | ICD-10-CM | POA: Diagnosis not present

## 2017-06-17 NOTE — Therapy (Signed)
[facility] [address], [place], [place], [address] Phone: [phone]   Fax:  [phone]  Physical Therapy Treatment  Patient Details  Name: QUAMERE MUSSELL MRN: [id number] Date of Birth: 1925-07-08 Referring Provider: Sherrie Mustache, MD    Encounter Date: 06/17/2017  PT End of Session - 06/17/17 1012    Visit Number  4    Number of Visits  10    Date for PT Re-Evaluation  08/02/17    PT Start Time  0929    PT Stop Time  1010    PT Time Calculation (min)  41 min    Activity Tolerance  Patient tolerated treatment well    Behavior During Therapy  [facility] for tasks assessed/performed       Past Medical History:  Diagnosis Date  . A-fib ([place])   . Aortic valve insufficiency, acquired   . Arthritis   . BPH (benign prostatic hyperplasia)   . Cerebral ischemia   . Colon cancer [facility])     s/p partial colectomy  . Complication of anesthesia    "woke up w/confusion and hallucinations once after mitral valve OR"  . Coronary artery disease    50-70% LAD mitral valve repair 4+ yrs ago  . Depression   . Dyslipidemia   . Glaucoma   . Heart murmur   . Hernia   . High cholesterol   . History of echocardiogram    Echo 5/16:  Mild LVH, EF 50-55%, Gr 1 DD, septal HK, Ao sclerosis without stenosis, mild AI, MV repair ok with borderline mild MS (mean 4 mmHg), mild MR, mild LAE, mod RAE, mild TR, PASP 30 mmHg  . HTN (hypertension)    takes Amlodipine daily  . Hx of cardiovascular stress test    Lexiscan Myoview 5/16:  Apical thinning, EF not gated, no ischemia. Low Risk  . Hyperlipidemia   . Major depression   . Mild cognitive impairment   . MVP (mitral valve prolapse)    S/P Rt mini thoractomy for Mitral Valve repair  . Nocturia   . Paroxysmal atrial fibrillation (HCC)    S/P Maze procedure  . 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 Prostate cancer ([place])   . Right vocal cord  cancer (HCC)    invasive squamous cell carcinoma   . Shortness of breath dyspnea    with exertion  . Sleep disturbance   . Thoracic aortic aneurysm ([place])   . Urinary frequency   . Urinary urgency     Past Surgical History:  Procedure Laterality Date  . CARDIAC CATHETERIZATION    . CATARACT EXTRACTION W/ INTRAOCULAR LENS  IMPLANT, BILATERAL Bilateral   . COLECTOMY  2004   Dr [name]  . FlexHD patch repair of chest wall hernia.  01/28/2011   [name]  . GASTROSTOMY W/ FEEDING TUBE    . IR GENERIC HISTORICAL  10/08/2016   IR GASTROSTOMY TUBE REMOVAL 10/08/2016 Sandi Mariscal, MD [facility]  . JOINT REPLACEMENT    . MAZE  12/27/2008   left side lesion set  . MITRAL VALVE REPAIR  12/27/2008   complex valvuloplasty with 35mm Memo 3D annuloplasty ring via right minithoracotomy  . PROSTATE BIOPSY    . TOTAL KNEE ARTHROPLASTY Right 2003  . TYMPANOPLASTY  1967   "? side"    There were no vitals filed for this visit.  Subjective Assessment - 06/17/17 0934    Subjective  Pt doesn't have electrolarynx  today so using white board to communicate.  "I have been doing the NuStep at home each day"    Currently in Pain?  No/denies                      [facility] Adult PT Treatment/Exercise - 06/17/17 0001      Neuro Re-ed    Neuro Re-ed Details   Tandem stance, SLS, narrow BOS, and marching all with min UE asst and PTA CGA.       Knee/Hip Exercises: Aerobic   Nustep  L1 x 8 min PT present to monitor   PT present to monitor     Knee/Hip Exercises: Standing   Heel Raises  Both;2 sets;10 reps    Hip Abduction  AROM;Stengthening;Both;1 set;10 reps;Knee straight    Abduction Limitations  tactile cues with standing exercise to reduce substitution 2# added   2# added   Hip Extension  Stengthening;Both;2 sets;10 reps    Extension Limitations  2# added      Knee/Hip Exercises: Seated   Long Arc Quad  Strengthening;Both;2 sets;10 reps 2# added   2# added   [facility]  20 reps, 5 second  hold    Clamshell with TheraBand  Red 20x   20x   Sit to Sand  without UE support;20 reps 2x10, light CGA, black pad to sit on   2x10, light CGA, black pad to sit on              PT Short Term Goals - 06/07/17 1241      PT SHORT TERM GOAL #1   Title  Pt will demo consistency and independence with his HEP.     Time  4    Period  Weeks    Status  New    Target Date  07/05/17      PT SHORT TERM GOAL #2   Title  Pt will demo improved tandem balance to atleast 20 sec with each LE forward, 2/3 trials, to improve safety with walking and other mobility during the day.     Time  4    Period  Weeks    Status  New      PT SHORT TERM GOAL #3   Title  Pt will demo improved safety with transfers evident by his ability to complete sit<> stand and no more than UE support on his thighs, during a full session.     Time  4    Period  Weeks    Status  New      PT SHORT TERM GOAL #4   Title  Pt will demo improved awareness of his environment evident by his ability to ambulate throughout the clinic without the need for verbal cues for negotiating obstacles.     Time  4    Period  Weeks    Status  New        PT Long Term Goals - 06/07/17 1245      PT LONG TERM GOAL #1   Title  Pt will demo independence with his advanced HEP to allow for transition into regular use of his facility's equipment at discharge.     Time  8    Period  Weeks    Status  New    Target Date  08/02/17      PT LONG TERM GOAL #2   Title  Pt will demo atleast 6 point improvement in his Berg balance score to reflect a decreased  risk of falling at home.     Time  8    Period  Weeks    Status  New      PT LONG TERM GOAL #3   Title  Pt will complete 5x sit to stand in less than 20 sec, with no more than UE support on his thighs, to reflect and improvement in his functional strength and endurance.     Time  8    Period  Weeks    Status  New      PT LONG TERM GOAL #4   Title  Pt will reduce his TUG score to  less than or equal to 18 sec, which will reflect and improvement in his functional balance and safety with mobility in the community.     Time  8    Period  Weeks    Status  New      PT LONG TERM GOAL #5   Title  Pt and his caregiver will report atleast a 40% improvement in his mobility and balance at home, to increase his overall safety and decrease risk of falling.     Time  8    Period  Weeks    Status  New            Plan - 06/17/17 0935    Clinical Impression Statement  Pt is compliant with using NuStep daily.  PT continues to work on balance and endurance exercises. Pt needs to use UE support with standing balance exercises. Pt tolerated ankle weights with seated and standing exercises today.  Pt remains dizzy at times. Pt is working on sit to stand to improve LE strength and overall balance.  Pt requires close supervision with all activity for safety.  Pt will continue to benefit from skilled PT for balance, strength and endurance exercises.      Rehab Potential  Good    Clinical Impairments Affecting Rehab Potential  (-) comorbidities, (+) pt has good family support    PT Frequency  2x / week    PT Duration  8 weeks    PT Treatment/Interventions  ADLs/Self Care Home Management;Cryotherapy;Balance training;Therapeutic exercise;Stair training;Gait training;Therapeutic activities;Functional mobility training;Neuromuscular re-education;Patient/family education;Passive range of motion;Manual techniques    PT Next Visit Plan   continue endurance, gait, LE strength and balance exercises    Recommended Other Services  initial certification is signed    Consulted and Agree with Plan of Care  Patient    Family Member Consulted  Pt's daughter        Patient will benefit from skilled therapeutic intervention in order to improve the following deficits and impairments:  Abnormal gait, Decreased balance, Decreased activity tolerance, Pain, Postural dysfunction, Decreased mobility, Decreased  coordination, Decreased endurance, Decreased range of motion, Decreased strength, Impaired flexibility, Difficulty walking, Decreased safety awareness  Visit Diagnosis: Muscle weakness (generalized)  Other abnormalities of gait and mobility  Unsteadiness on feet  Chronic pain of left knee     Problem List Patient Active Problem List   Diagnosis Date Noted  . Dilated cardiomyopathy ([place]) 04/19/2017  . Chronic systolic congestive heart failure ([place]) 03/30/2017  . Cough   . Pulmonary edema 12/06/2016  . H/O laryngectomy 03/26/2016  . Protein-calorie malnutrition, severe 02/26/2016  . Pharyngocutaneous fistula 02/21/2016  . S/P laryngectomy 01/15/2016  . S/P biopsy 01/15/2016  . Baker's cyst of knee 12/09/2015  . Other fatigue 12/09/2015  . 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  Prostate cancer ([place]) 01/10/2015  . S/P MVR (mitral valve repair) 12/20/2014  . S/P Maze operation for atrial fibrillation 12/20/2014  . Obesity (BMI 30-39.9) 04/02/2014  . Prolonged Q-T interval on ECG 04/02/2014  . Mild cognitive impairment 04/02/2014  . Major depressive disorder, recurrent episode, mild ([place]) 02/08/2014  . Insomnia 02/08/2014  . Dizziness and giddiness 02/08/2014  . Generalized muscle weakness 02/08/2014  . Cerumen impaction 02/08/2014  . Glaucoma 02/08/2014  . Colon cancer ([place])   . Coronary artery disease   . Moderate aortic insufficiency 01/03/2013  . Mild mitral regurgitation 01/03/2013  . MVP (mitral valve prolapse)   . Paroxysmal atrial fibrillation (HCC)   . HTN (hypertension)   . Depression   . chest wall hernia (lung hernia)      Sigurd Sos, PT 06/17/17 10:13 AM  [facility] [address] [place], [place], [address] Phone: [phone]   Fax:  [phone]  Name: REVEL STELLMACH MRN: [id number] Date of Birth: February 19, 1925

## 2017-06-21 ENCOUNTER — Ambulatory Visit: Payer: Medicare Other | Admitting: Physical Therapy

## 2017-06-21 ENCOUNTER — Encounter: Payer: Self-pay | Admitting: Physical Therapy

## 2017-06-21 DIAGNOSIS — M25562 Pain in left knee: Secondary | ICD-10-CM

## 2017-06-21 DIAGNOSIS — R2689 Other abnormalities of gait and mobility: Secondary | ICD-10-CM | POA: Diagnosis not present

## 2017-06-21 DIAGNOSIS — G8929 Other chronic pain: Secondary | ICD-10-CM | POA: Diagnosis not present

## 2017-06-21 DIAGNOSIS — M6281 Muscle weakness (generalized): Secondary | ICD-10-CM | POA: Diagnosis not present

## 2017-06-21 DIAGNOSIS — R2681 Unsteadiness on feet: Secondary | ICD-10-CM

## 2017-06-23 NOTE — Therapy (Addendum)
[facility] [address], [place], [place], [address] Phone: [phone]   Fax:  [phone]  Physical Therapy Treatment  Patient Details  Name: Timothy Suarez MRN: [id number] Date of Birth: 05-May-1925 Referring Provider: Sherrie Mustache, MD    Encounter Date: 06/21/2017    Past Medical History:  Diagnosis Date  . A-fib ([place])   . Aortic valve insufficiency, acquired   . Arthritis   . BPH (benign prostatic hyperplasia)   . Cerebral ischemia   . Colon cancer [facility])     s/p partial colectomy  . Complication of anesthesia    "woke up w/confusion and hallucinations once after mitral valve OR"  . Coronary artery disease    50-70% LAD mitral valve repair 4+ yrs ago  . Depression   . Dyslipidemia   . Glaucoma   . Heart murmur   . Hernia   . High cholesterol   . History of echocardiogram    Echo 5/16:  Mild LVH, EF 50-55%, Gr 1 DD, septal HK, Ao sclerosis without stenosis, mild AI, MV repair ok with borderline mild MS (mean 4 mmHg), mild MR, mild LAE, mod RAE, mild TR, PASP 30 mmHg  . HTN (hypertension)    takes Amlodipine daily  . Hx of cardiovascular stress test    Lexiscan Myoview 5/16:  Apical thinning, EF not gated, no ischemia. Low Risk  . Hyperlipidemia   . Major depression   . Mild cognitive impairment   . MVP (mitral valve prolapse)    S/P Rt mini thoractomy for Mitral Valve repair  . Nocturia   . Paroxysmal atrial fibrillation (HCC)    S/P Maze procedure  . 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 Prostate cancer ([place])   . 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 Shortness of breath dyspnea    with exertion  . Sleep disturbance   . Thoracic aortic aneurysm ([place])   . Urinary frequency   . Urinary urgency     Past Surgical History:  Procedure Laterality Date  . CARDIAC CATHETERIZATION    . CATARACT EXTRACTION W/  INTRAOCULAR LENS  IMPLANT, BILATERAL Bilateral   . COLECTOMY  2004   Dr [name]  . FlexHD patch repair of chest wall hernia.  01/28/2011   [name]  . GASTROSTOMY W/ FEEDING TUBE    . IR GENERIC HISTORICAL  10/08/2016   IR GASTROSTOMY TUBE REMOVAL 10/08/2016 Sandi Mariscal, MD [facility]  . JOINT REPLACEMENT    . MAZE  12/27/2008   left side lesion set  . MITRAL VALVE REPAIR  12/27/2008   complex valvuloplasty with 61mm Memo 3D annuloplasty ring via right minithoracotomy  . PROSTATE BIOPSY    . TOTAL KNEE ARTHROPLASTY Right 2003  . TYMPANOPLASTY  1967   "? side"    There were no vitals filed for this visit.                             PT Short Term Goals - 06/07/17 1241      PT SHORT TERM GOAL #1   Title  Pt will demo consistency and independence with his HEP.     Time  4    Period  Weeks    Status  New    Target Date  07/05/17      PT SHORT TERM GOAL #2   Title  Pt will demo improved tandem balance to atleast 20 sec with each LE forward, 2/3 trials, to improve safety with walking and other mobility during the day.     Time  4    Period  Weeks    Status  New      PT SHORT TERM GOAL #3   Title  Pt will demo improved safety with transfers evident by his ability to complete sit<> stand and no more than UE support on his thighs, during a full session.     Time  4    Period  Weeks    Status  New      PT SHORT TERM GOAL #4   Title  Pt will demo improved awareness of his environment evident by his ability to ambulate throughout the clinic without the need for verbal cues for negotiating obstacles.     Time  4    Period  Weeks    Status  New        PT Long Term Goals - 06/07/17 1245      PT LONG TERM GOAL #1   Title  Pt will demo independence with his advanced HEP to allow for transition into regular use of his facility's equipment at discharge.     Time  8    Period  Weeks    Status  New    Target Date  08/02/17      PT LONG TERM GOAL #2   Title   Pt will demo atleast 6 point improvement in his Berg balance score to reflect a decreased risk of falling at home.     Time  8    Period  Weeks    Status  New      PT LONG TERM GOAL #3   Title  Pt will complete 5x sit to stand in less than 20 sec, with no more than UE support on his thighs, to reflect and improvement in his functional strength and endurance.     Time  8    Period  Weeks    Status  New      PT LONG TERM GOAL #4   Title  Pt will reduce his TUG score to less than or equal to 18 sec, which will reflect and improvement in his functional balance and safety with mobility in the community.     Time  8    Period  Weeks    Status  New      PT LONG TERM GOAL #5   Title  Pt and his caregiver will report atleast a 40% improvement in his mobility and balance at home, to increase his overall safety and decrease risk of falling.     Time  8    Period  Weeks    Status  New              Patient will benefit from skilled therapeutic intervention in order to improve the following deficits and impairments:  Abnormal gait, Decreased balance, Decreased activity tolerance, Pain, Postural dysfunction, Decreased mobility, Decreased coordination, Decreased endurance, Decreased range of motion, Decreased strength, Impaired flexibility, Difficulty walking, Decreased safety awareness  Visit Diagnosis: Muscle weakness (generalized)  Other abnormalities of gait and mobility  Unsteadiness on feet  Chronic pain of left knee     Problem List Patient Active Problem List   Diagnosis Date Noted  . Dilated cardiomyopathy ([place]) 04/19/2017  . Chronic systolic congestive heart failure ([place]) 03/30/2017  . Cough   .  Pulmonary edema 12/06/2016  . H/O laryngectomy 03/26/2016  . Protein-calorie malnutrition, severe 02/26/2016  . Pharyngocutaneous fistula 02/21/2016  . S/P laryngectomy 01/15/2016  . S/P biopsy 01/15/2016  . Baker's cyst of knee 12/09/2015  . Other fatigue 12/09/2015  .  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 Prostate cancer ([place]) 01/10/2015  . S/P MVR (mitral valve repair) 12/20/2014  . S/P Maze operation for atrial fibrillation 12/20/2014  . Obesity (BMI 30-39.9) 04/02/2014  . Prolonged Q-T interval on ECG 04/02/2014  . Mild cognitive impairment 04/02/2014  . Major depressive disorder, recurrent episode, mild ([place]) 02/08/2014  . Insomnia 02/08/2014  . Dizziness and giddiness 02/08/2014  . Generalized muscle weakness 02/08/2014  . Cerumen impaction 02/08/2014  . Glaucoma 02/08/2014  . Colon cancer ([place])   . Coronary artery disease   . Moderate aortic insufficiency 01/03/2013  . Mild mitral regurgitation 01/03/2013  . MVP (mitral valve prolapse)   . Paroxysmal atrial fibrillation (HCC)   . HTN (hypertension)   . Depression   . chest wall hernia (lung hernia)     Umar Patmon, PTA 06/23/2017, 4:27 PM  [facility] [address] [place], [place], [address] Phone: [phone]   Fax:  [phone]  Name: Timothy Suarez MRN: [id number] Date of Birth: 1925/03/23  Subjective:   Pt has white board for communication today. No complaints today. No pain. Daughter reports her father is standing taller, better posture.  Objective: Nu Step: L2 x 8 min Standing hip abd, extension, marching 2x10 bil : verbal cues for posture and control of lifting legs.2# added Standing: tandem balance and single leg stance. Little to no need for UE support.  LAQ bil 2## 20x Walking with and without cane to work on steadiness. 4x of ortho gym. Verbal cues on step length/stride. Sit to stand: No UE   Clinical Impression:  Pt presents with improving postural awareness in addition to family members commenting on the same. He can perform most static balance exercises like tandem stance without UE support, but requires more assistance with dynamic balance exercises. Pt  also shows better steadiness and step length when he uses his cane. His steps shorten without the cane and becomes less steady.   Plan: Continue to work on LE strength and balance.

## 2017-06-24 ENCOUNTER — Ambulatory Visit: Payer: Medicare Other

## 2017-06-24 DIAGNOSIS — M6281 Muscle weakness (generalized): Secondary | ICD-10-CM

## 2017-06-24 DIAGNOSIS — R2681 Unsteadiness on feet: Secondary | ICD-10-CM

## 2017-06-24 DIAGNOSIS — M25562 Pain in left knee: Secondary | ICD-10-CM | POA: Diagnosis not present

## 2017-06-24 DIAGNOSIS — R2689 Other abnormalities of gait and mobility: Secondary | ICD-10-CM

## 2017-06-24 DIAGNOSIS — G8929 Other chronic pain: Secondary | ICD-10-CM | POA: Diagnosis not present

## 2017-06-24 NOTE — Therapy (Signed)
[facility] [address] [place], [place], [address] Phone: [phone]   Fax:  [phone]  Physical Therapy Treatment  Patient Details  Name: Timothy Suarez MRN: [id number] Date of Birth: February 03, 1925 Referring Provider: Sherrie Mustache, MD    Encounter Date: 06/24/2017  PT End of Session - 06/24/17 1008    Visit Number  6    Number of Visits  10    Date for PT Re-Evaluation  08/02/17    Authorization Type  U[employer] Medicare     Authorization - Visit Number  6    Authorization - Number of Visits  10    PT Start Time  [phone]    PT Stop Time  1017    PT Time Calculation (min)  44 min    Activity Tolerance  Patient tolerated treatment well    Behavior During Therapy  [facility] for tasks assessed/performed       Past Medical History:  Diagnosis Date  . A-fib ([place])   . Aortic valve insufficiency, acquired   . Arthritis   . BPH (benign prostatic hyperplasia)   . Cerebral ischemia   . Colon cancer [facility])     s/p partial colectomy  . Complication of anesthesia    "woke up w/confusion and hallucinations once after mitral valve OR"  . Coronary artery disease    50-70% LAD mitral valve repair 4+ yrs ago  . Depression   . Dyslipidemia   . Glaucoma   . Heart murmur   . Hernia   . High cholesterol   . History of echocardiogram    Echo 5/16:  Mild LVH, EF 50-55%, Gr 1 DD, septal HK, Ao sclerosis without stenosis, mild AI, MV repair ok with borderline mild MS (mean 4 mmHg), mild MR, mild LAE, mod RAE, mild TR, PASP 30 mmHg  . HTN (hypertension)    takes Amlodipine daily  . Hx of cardiovascular stress test    Lexiscan Myoview 5/16:  Apical thinning, EF not gated, no ischemia. Low Risk  . Hyperlipidemia   . Major depression   . Mild cognitive impairment   . MVP (mitral valve prolapse)    S/P Rt mini thoractomy for Mitral Valve repair  . Nocturia   . Paroxysmal atrial fibrillation (HCC)    S/P Maze procedure  .  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 Prostate cancer ([place])   . 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 Shortness of breath dyspnea    with exertion  . Sleep disturbance   . Thoracic aortic aneurysm ([place])   . Urinary frequency   . Urinary urgency     Past Surgical History:  Procedure Laterality Date  . CARDIAC CATHETERIZATION    . CATARACT EXTRACTION W/ INTRAOCULAR LENS  IMPLANT, BILATERAL Bilateral   . COLECTOMY  2004   Dr [name]  . DIRECT LARYNGOSCOPY N/A 01/15/2016   Procedure: DIRECT LARYNGOSCOPY WITH BIOPSY AND FROZEN SECTION;  Surgeon: Izora Gala, MD;  Location: [place];  Service: ENT;  Laterality: N/A;  . FlexHD patch repair of chest wall hernia.  01/28/2011   [name]  . GASTROSTOMY W/ FEEDING TUBE    . IR GENERIC HISTORICAL  10/08/2016   IR GASTROSTOMY TUBE REMOVAL 10/08/2016 Sandi Mariscal, MD [facility]-INTER[facility] RAD  . JOINT REPLACEMENT    . LARYNGETOMY N/A 01/15/2016   Procedure:  TOTAL LARYNGECTOMY;  Surgeon: Izora Gala, MD;  Location: [place];  Service: ENT;  Laterality: N/A;  . MAZE  12/27/2008   left side lesion set  . MICROLARYNGOSCOPY Right 01/29/2015   Procedure: MICROLARYNGOSCOPY WITH BIOSPY OF RIGHT VOCAL CORD;  Surgeon: Izora Gala, MD;  Location: [place];  Service: ENT;  Laterality: Right;  . MITRAL VALVE REPAIR  12/27/2008   complex valvuloplasty with 30mm Memo 3D annuloplasty ring via right minithoracotomy  . PECTORALIS FLAP Left 05/04/2016   Procedure: PECTORALIS FLAP to neck with possible, split thickness skin graft;  Surgeon: Loel Lofty Dillingham, DO;  Location: [place];  Service: Plastics;  Laterality: Left;  . PROSTATE BIOPSY    . SKIN SPLIT GRAFT Left 05/04/2016   Procedure: PECTORALIS MAJOR MYOCUTANEOUS FLAP RECONSTRUCTION OF PHARYNX AND SPLIT THICKNESS SKIN GRAFT;  Surgeon: Izora Gala, MD;  Location: [place];  Service: ENT;  Laterality: Left;  . TEE WITHOUT CARDIOVERSION N/A 01/03/2013   Procedure:  TRANSESOPHAGEAL ECHOCARDIOGRAM (TEE);  Surgeon: Sueanne Margarita, MD;  Location: [place];  Service: Cardiovascular;  Laterality: N/A;  . TOTAL KNEE ARTHROPLASTY Right 2003  . TRACHEAL ESOPHAGEAL PUNCTURE REPAIR N/A 03/26/2016   Procedure: TRACHEAL ESOPHAGEAL PUNCTURE;  Surgeon: Izora Gala, MD;  Location: [facility];  Service: ENT;  Laterality: N/A;  . TRACHEOESOPHAGEAL FISTULA REPAIR N/A 03/26/2016   Procedure: TRACHEO-ESOPHAGEAL   PUNCTURE,FISTULAR CLOSURE;  Surgeon: Izora Gala, MD;  Location: [place];  Service: ENT;  Laterality: N/A;  . TRACHEOESOPHAGEAL FISTULA REPAIR N/A 04/09/2016   Procedure: FISTULA REPAIR WITH  MUSCLE ROTATION FLAP;  Surgeon: Izora Gala, MD;  Location: [place];  Service: ENT;  Laterality: N/A;  . TRACHEOESOPHAGEAL FISTULA REPAIR N/A 07/27/2016   Procedure: CLOSURE OF FISTULA;  Surgeon: Izora Gala, MD;  Location: [place];  Service: ENT;  Laterality: N/A;  . TYMPANOPLASTY  1967   "? side"    There were no vitals filed for this visit.  Subjective Assessment - 06/24/17 0937    Subjective  Electrolarynx is still being fixed.  It should be returned by Saturday.      Patient Stated Goals  improve mobility and strength.     Currently in Pain?  No/denies                      [facility] Adult PT Treatment/Exercise - 06/24/17 0001      Ambulation/Gait   Ambulation/Gait  -- 4 laps of ortho gym    Gait Comments  VC for stride length, picking feet up, with & without cane, postural cues.       Neuro Re-ed    Neuro Re-ed Details   Tandem stance, SLS, narrow BOS, and marching all with min UE asst and PTA CGA.       Knee/Hip Exercises: Aerobic   Nustep  Level 2x 9 minutes      Knee/Hip Exercises: Standing   Heel Raises  Both;2 sets;10 reps    Knee Flexion  Strengthening;Both;2 sets;10 reps 2#    Hip Abduction  AROM;Stengthening;Both;1 set;20 reps;Knee straight    Abduction Limitations  2#    Hip Extension  Stengthening;Both;2 sets;10 reps    Extension Limitations  2#  added      Knee/Hip Exercises: Seated   Long Arc Quad  Strengthening;Both;2 sets;15 reps 2# added    [facility]  20 reps, 5 second hold    Clamshell with TheraBand  Red 25x    Sit to Sand  without UE support;20 reps 2x10, light CGA,  PT Short Term Goals - 06/07/17 1241      PT SHORT TERM GOAL #1   Title  Pt will demo consistency and independence with his HEP.     Time  4    Period  Weeks    Status  New    Target Date  07/05/17      PT SHORT TERM GOAL #2   Title  Pt will demo improved tandem balance to atleast 20 sec with each LE forward, 2/3 trials, to improve safety with walking and other mobility during the day.     Time  4    Period  Weeks    Status  New      PT SHORT TERM GOAL #3   Title  Pt will demo improved safety with transfers evident by his ability to complete sit<> stand and no more than UE support on his thighs, during a full session.     Time  4    Period  Weeks    Status  New      PT SHORT TERM GOAL #4   Title  Pt will demo improved awareness of his environment evident by his ability to ambulate throughout the clinic without the need for verbal cues for negotiating obstacles.     Time  4    Period  Weeks    Status  New        PT Long Term Goals - 06/07/17 1245      PT LONG TERM GOAL #1   Title  Pt will demo independence with his advanced HEP to allow for transition into regular use of his facility's equipment at discharge.     Time  8    Period  Weeks    Status  New    Target Date  08/02/17      PT LONG TERM GOAL #2   Title  Pt will demo atleast 6 point improvement in his Berg balance score to reflect a decreased risk of falling at home.     Time  8    Period  Weeks    Status  New      PT LONG TERM GOAL #3   Title  Pt will complete 5x sit to stand in less than 20 sec, with no more than UE support on his thighs, to reflect and improvement in his functional strength and endurance.     Time  8    Period  Weeks    Status   New      PT LONG TERM GOAL #4   Title  Pt will reduce his TUG score to less than or equal to 18 sec, which will reflect and improvement in his functional balance and safety with mobility in the community.     Time  8    Period  Weeks    Status  New      PT LONG TERM GOAL #5   Title  Pt and his caregiver will report atleast a 40% improvement in his mobility and balance at home, to increase his overall safety and decrease risk of falling.     Time  8    Period  Weeks    Status  New            Plan - 06/24/17 0956    Clinical Impression Statement  Pt tolerated all exercise well today.  Pt requires close supervision for safety.  Pt reports fatigue and dizziness after 2 laps of walking with  cane in the gym.  Pt uses his NuStep daily and performs exercises at home.  Pt with chronic endurance and strength deficits and will continue to benefit from skilled PT for strength, gait and balance to improve safety at home and in the community.      Rehab Potential  Good    PT Frequency  2x / week    PT Duration  8 weeks    PT Treatment/Interventions  ADLs/Self Care Home Management;Cryotherapy;Balance training;Therapeutic exercise;Stair training;Gait training;Therapeutic activities;Functional mobility training;Neuromuscular re-education;Patient/family education;Passive range of motion;Manual techniques    PT Next Visit Plan   continue endurance, gait, LE strength and balance exercises    Consulted and Agree with Plan of Care  Patient       Patient will benefit from skilled therapeutic intervention in order to improve the following deficits and impairments:  Abnormal gait, Decreased balance, Decreased activity tolerance, Pain, Postural dysfunction, Decreased mobility, Decreased coordination, Decreased endurance, Decreased range of motion, Decreased strength, Impaired flexibility, Difficulty walking, Decreased safety awareness  Visit Diagnosis: Muscle weakness (generalized)  Other abnormalities of  gait and mobility  Unsteadiness on feet     Problem List Patient Active Problem List   Diagnosis Date Noted  . Dilated cardiomyopathy ([place]) 04/19/2017  . Chronic systolic congestive heart failure ([place]) 03/30/2017  . Cough   . Pulmonary edema 12/06/2016  . H/O laryngectomy 03/26/2016  . Protein-calorie malnutrition, severe 02/26/2016  . Pharyngocutaneous fistula 02/21/2016  . S/P laryngectomy 01/15/2016  . S/P biopsy 01/15/2016  . Baker's cyst of knee 12/09/2015  . Other fatigue 12/09/2015  . 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 Prostate cancer ([place]) 01/10/2015  . S/P MVR (mitral valve repair) 12/20/2014  . S/P Maze operation for atrial fibrillation 12/20/2014  . Obesity (BMI 30-39.9) 04/02/2014  . Prolonged Q-T interval on ECG 04/02/2014  . Mild cognitive impairment 04/02/2014  . Major depressive disorder, recurrent episode, mild ([place]) 02/08/2014  . Insomnia 02/08/2014  . Dizziness and giddiness 02/08/2014  . Generalized muscle weakness 02/08/2014  . Cerumen impaction 02/08/2014  . Glaucoma 02/08/2014  . Colon cancer ([place])   . Coronary artery disease   . Moderate aortic insufficiency 01/03/2013  . Mild mitral regurgitation 01/03/2013  . MVP (mitral valve prolapse)   . Paroxysmal atrial fibrillation (HCC)   . HTN (hypertension)   . Depression   . chest wall hernia (lung hernia)      Sigurd Sos, PT 06/24/17 10:19 AM  [facility] [address], [place], [place], [address] Phone: [phone]   Fax:  [phone]  Name: Timothy Suarez MRN: [id number] Date of Birth: [birth year]

## 2017-06-25 DIAGNOSIS — C320 Malignant neoplasm of glottis: Secondary | ICD-10-CM | POA: Diagnosis not present

## 2017-06-25 DIAGNOSIS — Z9002 Acquired absence of larynx: Secondary | ICD-10-CM | POA: Diagnosis not present

## 2017-06-28 ENCOUNTER — Ambulatory Visit: Payer: Medicare Other | Admitting: Physical Therapy

## 2017-06-28 ENCOUNTER — Encounter: Payer: Self-pay | Admitting: Physical Therapy

## 2017-06-28 DIAGNOSIS — R2689 Other abnormalities of gait and mobility: Secondary | ICD-10-CM | POA: Diagnosis not present

## 2017-06-28 DIAGNOSIS — M6281 Muscle weakness (generalized): Secondary | ICD-10-CM | POA: Diagnosis not present

## 2017-06-28 DIAGNOSIS — G8929 Other chronic pain: Secondary | ICD-10-CM | POA: Diagnosis not present

## 2017-06-28 DIAGNOSIS — R2681 Unsteadiness on feet: Secondary | ICD-10-CM | POA: Diagnosis not present

## 2017-06-28 DIAGNOSIS — M25562 Pain in left knee: Secondary | ICD-10-CM

## 2017-06-28 NOTE — Therapy (Signed)
[facility] [address] [place], [place], [address] Phone: [phone]   Fax:  [phone]  Physical Therapy Treatment  Patient Details  Name: Timothy Suarez MRN: [id number] Date of Birth: 05-11-1925 Referring Provider: Sherrie Mustache, MD    Encounter Date: 06/28/2017  PT End of Session - 06/28/17 1400    Visit Number  7    Number of Visits  10    Date for PT Re-Evaluation  08/02/17    Authorization Type  [employer]HC Medicare     Authorization Time Period  06/07/17 to 08/02/17    Authorization - Visit Number  7    Authorization - Number of Visits  10    PT Start Time  1400 Tx session limited/stopped often due to dizziness & blurred  visiion    PT Stop Time  1441    PT Time Calculation (min)  41 min    Activity Tolerance  Patient tolerated treatment well Increased dizziness and blurred vision throughout session.     Behavior During Therapy  --       Past Medical History:  Diagnosis Date  . A-fib ([place])   . Aortic valve insufficiency, acquired   . Arthritis   . BPH (benign prostatic hyperplasia)   . Cerebral ischemia   . Colon cancer [facility])     s/p partial colectomy  . Complication of anesthesia    "woke up w/confusion and hallucinations once after mitral valve OR"  . Coronary artery disease    50-70% LAD mitral valve repair 4+ yrs ago  . Depression   . Dyslipidemia   . Glaucoma   . Heart murmur   . Hernia   . High cholesterol   . History of echocardiogram    Echo 5/16:  Mild LVH, EF 50-55%, Gr 1 DD, septal HK, Ao sclerosis without stenosis, mild AI, MV repair ok with borderline mild MS (mean 4 mmHg), mild MR, mild LAE, mod RAE, mild TR, PASP 30 mmHg  . HTN (hypertension)    takes Amlodipine daily  . Hx of cardiovascular stress test    Lexiscan Myoview 5/16:  Apical thinning, EF not gated, no ischemia. Low Risk  . Hyperlipidemia   . Major depression   . Mild cognitive impairment   . MVP (mitral valve  prolapse)    S/P Rt mini thoractomy for Mitral Valve repair  . Nocturia   . Paroxysmal atrial fibrillation (HCC)    S/P Maze procedure  . 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 Prostate cancer ([place])   . 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 Shortness of breath dyspnea    with exertion  . Sleep disturbance   . Thoracic aortic aneurysm ([place])   . Urinary frequency   . Urinary urgency     Past Surgical History:  Procedure Laterality Date  . CARDIAC CATHETERIZATION    . CATARACT EXTRACTION W/ INTRAOCULAR LENS  IMPLANT, BILATERAL Bilateral   . CLOSURE OF FISTULA N/A 07/27/2016   Performed by Izora Gala, MD at [place]  2004   Dr [name]  . DIRECT LARYNGOSCOPY WITH BIOPSY AND FROZEN SECTION N/A 01/15/2016   Performed by Izora Gala, MD at [place]  . FISTULA REPAIR WITH  MUSCLE ROTATION FLAP N/A 04/09/2016   Performed by Izora Gala, MD at [place]  . FlexHD patch repair of chest wall hernia.  01/28/2011  [name]  . GASTROSTOMY W/ FEEDING TUBE    . IR GENERIC HISTORICAL  10/08/2016   IR GASTROSTOMY TUBE REMOVAL 10/08/2016 Sandi Mariscal, MD [facility]-INTER[facility] RAD  . JOINT REPLACEMENT    . MAZE  12/27/2008   left side lesion set  . MICROLARYNGOSCOPY WITH BIOSPY OF RIGHT VOCAL CORD Right 01/29/2015   Performed by Izora Gala, MD at [place]  . MITRAL VALVE REPAIR  12/27/2008   complex valvuloplasty with 53mm Memo 3D annuloplasty ring via right minithoracotomy  . PECTORALIS FLAP to neck with possible, split thickness skin graft Left 05/04/2016   Performed by Wallace Going, DO at [place]  . PECTORALIS MAJOR MYOCUTANEOUS FLAP RECONSTRUCTION OF PHARYNX AND SPLIT THICKNESS SKIN GRAFT Left 05/04/2016   Performed by Izora Gala, MD at [place]    . TOTAL KNEE ARTHROPLASTY Right 2003  . TOTAL LARYNGECTOMY N/A 01/15/2016   Performed by Izora Gala, MD at [place] N/A 03/26/2016    Performed by Izora Gala, MD at [place] N/A 03/26/2016   Performed by Izora Gala, MD at [place]  . TRANSESOPHAGEAL ECHOCARDIOGRAM (TEE) N/A 01/03/2013   Performed by Sueanne Margarita, MD at [place]  . TYMPANOPLASTY  1967   "? side"    There were no vitals filed for this visit.  Subjective Assessment - 06/28/17 1405    Subjective  Pt by himself today, no electolarynx. No complaints.    Pertinent History  trachiectomy, a-fib, CHF, Rt TKA in 2003, thoracic aortic aneurysm    Currently in Pain?  No/denies    Multiple Pain Sites  No                      [facility] Adult PT Treatment/Exercise - 06/28/17 0001      Knee/Hip Exercises: Aerobic   Nustep  L2 x 10 min      Knee/Hip Exercises: Standing   Heel Raises  Both;2 sets;10 reps    Knee Flexion  Strengthening;Both;2 sets;10 reps 2#    Hip Abduction  Stengthening;Both;1 set;20 reps;Knee straight    Abduction Limitations  2#    Hip Extension  Stengthening;Both;1 set;20 reps;Knee straight    Extension Limitations  2#      Knee/Hip Exercises: Seated   Long Arc Quad  Strengthening;Both;1 set;20 reps;Weights    Long Arc Quad Weight  2 lbs.    Clamshell with TheraBand  Green 25x    Sit to Sand  without UE support;20 reps 2x10, light CGA,                PT Short Term Goals - 06/28/17 1403      PT SHORT TERM GOAL #1   Title  Pt will demo consistency and independence with his HEP.     Baseline  Target date 02/25/2016    Time  4    Period  Weeks    Status  Achieved        PT Long Term Goals - 06/07/17 1245      PT LONG TERM GOAL #1   Title  Pt will demo independence with his advanced HEP to allow for transition into regular use of his facility's equipment at discharge.     Time  8    Period  Weeks    Status  New    Target Date  08/02/17      PT LONG  TERM GOAL #2   Title  Pt will demo atleast 6 point improvement in his Berg balance score to reflect a  decreased risk of falling at home.     Time  8    Period  Weeks    Status  New      PT LONG TERM GOAL #3   Title  Pt will complete 5x sit to stand in less than 20 sec, with no more than UE support on his thighs, to reflect and improvement in his functional strength and endurance.     Time  8    Period  Weeks    Status  New      PT LONG TERM GOAL #4   Title  Pt will reduce his TUG score to less than or equal to 18 sec, which will reflect and improvement in his functional balance and safety with mobility in the community.     Time  8    Period  Weeks    Status  New      PT LONG TERM GOAL #5   Title  Pt and his caregiver will report atleast a 40% improvement in his mobility and balance at home, to increase his overall safety and decrease risk of falling.     Time  8    Period  Weeks    Status  New            Plan - 06/28/17 1402    Clinical Impression Statement  Pt had multiple complaints of dizziness and blurry vision throughout the sessiont today. He said it comes and goes and felt the exercise did not make it worse. PTA checked oxygen & hearet rate which were both within normal limits. Pt was able to complete 1 1/2 laps of walking with his cane but took more stops due to labored breathing, session was more limited today because of his dizziness/blurred vision.  Pt was also instructed to sit in the lobby for 5-10 min before wqe assessed his safety to drive home.     Rehab Potential  Good    Clinical Impairments Affecting Rehab Potential  (-) comorbidities, (+) pt has good family support    PT Frequency  2x / week    PT Duration  8 weeks    PT Treatment/Interventions  ADLs/Self Care Home Management;Cryotherapy;Balance training;Therapeutic exercise;Stair training;Gait training;Therapeutic activities;Functional mobility training;Neuromuscular re-education;Patient/family education;Passive range of motion;Manual techniques    PT Next Visit Plan   continue endurance, gait, LE strength  and balance exercises    Consulted and Agree with Plan of Care  Patient       Patient will benefit from skilled therapeutic intervention in order to improve the following deficits and impairments:  Abnormal gait, Decreased balance, Decreased activity tolerance, Pain, Postural dysfunction, Decreased mobility, Decreased coordination, Decreased endurance, Decreased range of motion, Decreased strength, Impaired flexibility, Difficulty walking, Decreased safety awareness  Visit Diagnosis: Muscle weakness (generalized)  Other abnormalities of gait and mobility  Unsteadiness on feet  Chronic pain of left knee     Problem List Patient Active Problem List   Diagnosis Date Noted  . Dilated cardiomyopathy ([place]) 04/19/2017  . Chronic systolic congestive heart failure ([place]) 03/30/2017  . Cough   . Pulmonary edema 12/06/2016  . H/O laryngectomy 03/26/2016  . Protein-calorie malnutrition, severe 02/26/2016  . Pharyngocutaneous fistula 02/21/2016  . S/P laryngectomy 01/15/2016  . S/P biopsy 01/15/2016  . Baker's cyst of knee 12/09/2015  . Other fatigue 12/09/2015  . Stage T1a  Squamous Cell Carcinoma of the Right True Vocal Cord 01/30/2015  . Prostate cancer ([place]) 01/10/2015  . S/P MVR (mitral valve repair) 12/20/2014  . S/P Maze operation for atrial fibrillation 12/20/2014  . Obesity (BMI 30-39.9) 04/02/2014  . Prolonged Q-T interval on ECG 04/02/2014  . Mild cognitive impairment 04/02/2014  . Major depressive disorder, recurrent episode, mild ([place]) 02/08/2014  . Insomnia 02/08/2014  . Dizziness and giddiness 02/08/2014  . Generalized muscle weakness 02/08/2014  . Cerumen impaction 02/08/2014  . Glaucoma 02/08/2014  . Colon cancer ([place])   . Coronary artery disease   . Moderate aortic insufficiency 01/03/2013  . Mild mitral regurgitation 01/03/2013  . MVP (mitral valve prolapse)   . Paroxysmal atrial fibrillation (HCC)   . HTN (hypertension)   . Depression   . chest wall hernia  (lung hernia)     Timothy Suarez, PTA 06/28/2017, 2:47 PM  [facility] [address], [place], [place], [address] Phone: [phone]   Fax:  [phone]  Name: Timothy Suarez MRN: [id number] Date of Birth: April 25, 1925

## 2017-07-05 ENCOUNTER — Ambulatory Visit: Payer: Medicare Other | Admitting: Physical Therapy

## 2017-07-05 ENCOUNTER — Encounter: Payer: Self-pay | Admitting: Physical Therapy

## 2017-07-05 DIAGNOSIS — R2681 Unsteadiness on feet: Secondary | ICD-10-CM

## 2017-07-05 DIAGNOSIS — G8929 Other chronic pain: Secondary | ICD-10-CM

## 2017-07-05 DIAGNOSIS — R2689 Other abnormalities of gait and mobility: Secondary | ICD-10-CM

## 2017-07-05 DIAGNOSIS — M25562 Pain in left knee: Secondary | ICD-10-CM

## 2017-07-05 DIAGNOSIS — M6281 Muscle weakness (generalized): Secondary | ICD-10-CM | POA: Diagnosis not present

## 2017-07-05 NOTE — Therapy (Addendum)
[facility] [address] [place], [place], [address] Phone: [phone]   Fax:  [phone]  Physical Therapy Treatment  Patient Details  Name: Timothy Suarez MRN: [id number] Date of Birth: 15-Feb-1925 Referring Provider: Sherrie Mustache, MD    Encounter Date: 07/05/2017  PT End of Session - 07/05/17 1014    Visit Number  8    Number of Visits  10    Date for PT Re-Evaluation  08/02/17    Authorization Type  [employer] Medicare     Authorization - Visit Number  8    Authorization - Number of Visits  10    PT Start Time  3664    PT Stop Time  1102    PT Time Calculation (min)  47 min    Activity Tolerance  Patient tolerated treatment well    Behavior During Therapy  --       Past Medical History:  Diagnosis Date  . A-fib ([place])   . Aortic valve insufficiency, acquired   . Arthritis   . BPH (benign prostatic hyperplasia)   . Cerebral ischemia   . Colon cancer [facility])     s/p partial colectomy  . Complication of anesthesia    "woke up w/confusion and hallucinations once after mitral valve OR"  . Coronary artery disease    50-70% LAD mitral valve repair 4+ yrs ago  . Depression   . Dyslipidemia   . Glaucoma   . Heart murmur   . Hernia   . High cholesterol   . History of echocardiogram    Echo 5/16:  Mild LVH, EF 50-55%, Gr 1 DD, septal HK, Ao sclerosis without stenosis, mild AI, MV repair ok with borderline mild MS (mean 4 mmHg), mild MR, mild LAE, mod RAE, mild TR, PASP 30 mmHg  . HTN (hypertension)    takes Amlodipine daily  . Hx of cardiovascular stress test    Lexiscan Myoview 5/16:  Apical thinning, EF not gated, no ischemia. Low Risk  . Hyperlipidemia   . Major depression   . Mild cognitive impairment   . MVP (mitral valve prolapse)    S/P Rt mini thoractomy for Mitral Valve repair  . Nocturia   . Paroxysmal atrial fibrillation (HCC)    S/P Maze procedure  . Pharyngocutaneous fistula hospitalized  02/21/2016    s/p salvage laryngectomy  . Prostate cancer ([place])   . 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 Shortness of breath dyspnea    with exertion  . Sleep disturbance   . Thoracic aortic aneurysm ([place])   . Urinary frequency   . Urinary urgency     Past Surgical History:  Procedure Laterality Date  . CARDIAC CATHETERIZATION    . CATARACT EXTRACTION W/ INTRAOCULAR LENS  IMPLANT, BILATERAL Bilateral   . COLECTOMY  2004   Dr [name]  . DIRECT LARYNGOSCOPY N/A 01/15/2016   Procedure: DIRECT LARYNGOSCOPY WITH BIOPSY AND FROZEN SECTION;  Surgeon: Izora Gala, MD;  Location: [place];  Service: ENT;  Laterality: N/A;  . FlexHD patch repair of chest wall hernia.  01/28/2011   [name]  . GASTROSTOMY W/ FEEDING TUBE    . IR GENERIC HISTORICAL  10/08/2016   IR GASTROSTOMY TUBE REMOVAL 10/08/2016 Sandi Mariscal, MD [facility]-INTERV RAD  . JOINT REPLACEMENT    . LARYNGETOMY N/A 01/15/2016   Procedure:  TOTAL LARYNGECTOMY;  Surgeon: Izora Gala, MD;  Location: [place];  Service: ENT;  Laterality: N/A;  . MAZE  12/27/2008   left side lesion set  . MICROLARYNGOSCOPY Right 01/29/2015   Procedure: MICROLARYNGOSCOPY WITH BIOSPY OF RIGHT VOCAL CORD;  Surgeon: Izora Gala, MD;  Location: [place];  Service: ENT;  Laterality: Right;  . MITRAL VALVE REPAIR  12/27/2008   complex valvuloplasty with 39m Memo 3D annuloplasty ring via right minithoracotomy  . PECTORALIS FLAP Left 05/04/2016   Procedure: PECTORALIS FLAP to neck with possible, split thickness skin graft;  Surgeon: CLoel LoftyDillingham, DO;  [place]  Service: Plastics;  Laterality: Left;  . PROSTATE BIOPSY    . SKIN SPLIT GRAFT Left 05/04/2016   Procedure: PECTORALIS MAJOR MYOCUTANEOUS FLAP RECONSTRUCTION OF PHARYNX AND SPLIT THICKNESS SKIN GRAFT;  Surgeon: JIzora Gala MD;  [place]  Service: ENT;  Laterality: Left;  . TEE WITHOUT CARDIOVERSION N/A 01/03/2013   Procedure: TRANSESOPHAGEAL ECHOCARDIOGRAM (TEE);  Surgeon:  TSueanne Margarita MD;  [place]  Service: Cardiovascular;  Laterality: N/A;  . TOTAL KNEE ARTHROPLASTY Right 2003  . TRACHEAL ESOPHAGEAL PUNCTURE REPAIR N/A 03/26/2016   Procedure: TRACHEAL ESOPHAGEAL PUNCTURE;  Surgeon: JIzora Gala MD;  [facility]  Service: ENT;  Laterality: N/A;  . TRACHEOESOPHAGEAL FISTULA REPAIR N/A 03/26/2016   Procedure: TRACHEO-ESOPHAGEAL   PUNCTURE,FISTULAR CLOSURE;  Surgeon: JIzora Gala MD;  [place]  Service: ENT;  Laterality: N/A;  . TRACHEOESOPHAGEAL FISTULA REPAIR N/A 04/09/2016   Procedure: FISTULA REPAIR WITH  MUSCLE ROTATION FLAP;  Surgeon: JIzora Gala MD;  [place]  Service: ENT;  Laterality: N/A;  . TRACHEOESOPHAGEAL FISTULA REPAIR N/A 07/27/2016   Procedure: CLOSURE OF FISTULA;  Surgeon: JIzora Gala MD;  [place]  Service: ENT;  Laterality: N/A;  . TYMPANOPLASTY  1967   "? side"    There were no vitals filed for this visit.  Subjective Assessment - 07/05/17 1015    Subjective  Pt reports feeling ok today. The dizziness is low, not like last session.     Patient is accompained by:  Family member    Pertinent History  trachiectomy, a-fib, CHF, Rt TKA in 2003, thoracic aortic aneurysm    Currently in Pain?  No/denies    Multiple Pain Sites  No                      [facility] Adult PT Treatment/Exercise - 07/05/17 0001      Neuro Re-ed    Neuro Re-ed Details   Marching with lessenig UE support  with tandem stance 20 sec at the end of 20 marches, walking with cane establishing more steadiness with less external support 4 laps with cane CGA Repeat with alt hip abduction into narrow BOS stance,      Knee/Hip Exercises: Aerobic   Nustep  L2 x 10 min PTA present      Knee/Hip Exercises: Seated   Long Arc Quad  Strengthening;Both;2 sets;10 reps;Weights    Long Arc Quad Weight  3 lbs.    Clamshell with TMarga [id number]               PT Short Term Goals - 07/05/17 1109      PT SHORT TERM  GOAL #2   Title  Pt will demo improved tandem balance to atleast 20 sec with each LE forward, 2/3 trials, to improve safety with walking and other mobility during the day.     Baseline  Target date 02/25/2016    Time  4  Period  Weeks    Status  Partially Met Needs support only getting in/out of the stance      PT SHORT TERM GOAL #3   Title  Pt will demo improved safety with transfers evident by his ability to complete sit<> stand and no more than UE support on his thighs, during a full session.     Baseline  Target date 02/25/2016    Time  4    Period  Weeks    Status  Partially Met Can do half the time or when cued.        PT Long Term Goals - 06/07/17 1245      PT LONG TERM GOAL #1   Title  Pt will demo independence with his advanced HEP to allow for transition into regular use of his facility's equipment at discharge.     Time  8    Period  Weeks    Status  New    Target Date  08/02/17      PT LONG TERM GOAL #2   Title  Pt will demo atleast 6 point improvement in his Berg balance score to reflect a decreased risk of falling at home.     Time  8    Period  Weeks    Status  New      PT LONG TERM GOAL #3   Title  Pt will complete 5x sit to stand in less than 20 sec, with no more than UE support on his thighs, to reflect and improvement in his functional strength and endurance.     Time  8    Period  Weeks    Status  New      PT LONG TERM GOAL #4   Title  Pt will reduce his TUG score to less than or equal to 18 sec, which will reflect and improvement in his functional balance and safety with mobility in the community.     Time  8    Period  Weeks    Status  New      PT LONG TERM GOAL #5   Title  Pt and his caregiver will report atleast a 40% improvement in his mobility and balance at home, to increase his overall safety and decrease risk of falling.     Time  8    Period  Weeks    Status  New            Plan - 07/05/17 1019    Clinical Impression Statement   Pt had more appropraite response to his therapy sesison today, no increase in dizziness or nausea. Attempted to reduce the amount the UE needed for balance exercises. Requires at least one finger on each hand for support  getting into and out of tandem stance and getting in and out of narrow BOS.     Rehab Potential  Good    Clinical Impairments Affecting Rehab Potential  (-) comorbidities, (+) pt has good family support    PT Frequency  2x / week    PT Duration  8 weeks    PT Treatment/Interventions  ADLs/Self Care Home Management;Cryotherapy;Balance training;Therapeutic exercise;Stair training;Gait training;Therapeutic activities;Functional mobility training;Neuromuscular re-education;Patient/family education;Passive range of motion;Manual techniques    PT Next Visit Plan   continue endurance, gait, LE strength and balance exercises    Family Member Consulted  Pt's daughter        Patient will benefit from skilled therapeutic intervention in order to improve the following deficits  and impairments:  Abnormal gait, Decreased balance, Decreased activity tolerance, Pain, Postural dysfunction, Decreased mobility, Decreased coordination, Decreased endurance, Decreased range of motion, Decreased strength, Impaired flexibility, Difficulty walking, Decreased safety awareness  Visit Diagnosis: Muscle weakness (generalized)  Other abnormalities of gait and mobility  Unsteadiness on feet  Chronic pain of left knee     Problem List Patient Active Problem List   Diagnosis Date Noted  . Dilated cardiomyopathy ([place]) 04/19/2017  . Chronic systolic congestive heart failure ([place]) 03/30/2017  . Cough   . Pulmonary edema 12/06/2016  . H/O laryngectomy 03/26/2016  . Protein-calorie malnutrition, severe 02/26/2016  . Pharyngocutaneous fistula 02/21/2016  . S/P laryngectomy 01/15/2016  . S/P biopsy 01/15/2016  . Baker's cyst of knee 12/09/2015  . Other fatigue 12/09/2015  . Stage T1a Squamous Cell  Carcinoma of the Right True Vocal Cord 01/30/2015  . Prostate cancer ([place]) 01/10/2015  . S/P MVR (mitral valve repair) 12/20/2014  . S/P Maze operation for atrial fibrillation 12/20/2014  . Obesity (BMI 30-39.9) 04/02/2014  . Prolonged Q-T interval on ECG 04/02/2014  . Mild cognitive impairment 04/02/2014  . Major depressive disorder, recurrent episode, mild ([place]) 02/08/2014  . Insomnia 02/08/2014  . Dizziness and giddiness 02/08/2014  . Generalized muscle weakness 02/08/2014  . Cerumen impaction 02/08/2014  . Glaucoma 02/08/2014  . Colon cancer ([place])   . Coronary artery disease   . Moderate aortic insufficiency 01/03/2013  . Mild mitral regurgitation 01/03/2013  . MVP (mitral valve prolapse)   . Paroxysmal atrial fibrillation (HCC)   . HTN (hypertension)   . Depression   . chest wall hernia (lung hernia)     Drinda Belgard, PTA 07/05/2017, 3:04 PM PHYSICAL THERAPY DISCHARGE SUMMARY  Visits from Start of Care:8 Current functional level related to goals / functional outcomes: Pt didn't attend after 06/25/17 due to change in medical status.     Remaining deficits: See above for most current status.     Education / Equipment: HEP Plan: Patient agrees to discharge.  Patient goals were partially met. Patient is being discharged due to a change in medical status.  ?????        Sigurd Sos, PT 08/30/17 11:25 AM   [facility] [address] [place], [place], [address] Phone: [phone]   Fax:  [phone]  Name: Timothy Suarez MRN: [id number] Date of Birth: [birth year]

## 2017-07-08 ENCOUNTER — Encounter: Payer: Self-pay | Admitting: Internal Medicine

## 2017-07-08 ENCOUNTER — Ambulatory Visit (INDEPENDENT_AMBULATORY_CARE_PROVIDER_SITE_OTHER): Payer: Medicare Other | Admitting: Internal Medicine

## 2017-07-08 VITALS — BP 138/60 | HR 78 | Temp 97.9°F | Wt 178.0 lb

## 2017-07-08 DIAGNOSIS — E039 Hypothyroidism, unspecified: Secondary | ICD-10-CM | POA: Diagnosis not present

## 2017-07-08 DIAGNOSIS — R2689 Other abnormalities of gait and mobility: Secondary | ICD-10-CM | POA: Diagnosis not present

## 2017-07-08 DIAGNOSIS — G4700 Insomnia, unspecified: Secondary | ICD-10-CM

## 2017-07-08 DIAGNOSIS — I5022 Chronic systolic (congestive) heart failure: Secondary | ICD-10-CM | POA: Diagnosis not present

## 2017-07-08 DIAGNOSIS — E43 Unspecified severe protein-calorie malnutrition: Secondary | ICD-10-CM | POA: Diagnosis not present

## 2017-07-08 DIAGNOSIS — M7122 Synovial cyst of popliteal space [Baker], left knee: Secondary | ICD-10-CM | POA: Insufficient documentation

## 2017-07-08 DIAGNOSIS — I480 Paroxysmal atrial fibrillation: Secondary | ICD-10-CM

## 2017-07-08 MED ORDER — ZOLPIDEM TARTRATE 5 MG PO TABS: ORAL_TABLET | ORAL | 3 refills | Status: DC

## 2017-07-08 NOTE — Progress Notes (Signed)
Location:  [facility] clinic Provider:  Daveyon Kitchings L. Mariea Clonts, D.O., C.M.D.  Code Status: DNR Goals of Care:  Advanced Directives 07/08/2017  Does Patient Have a Medical Advance Directive? Yes  Type of Advance Directive Living will  Does patient want to make changes to medical advance directive? No - Patient declined  Copy of [place] in Chart? -  Pre-existing out of facility DNR order (yellow form or pink MOST form) -     Chief Complaint  Patient presents with  . Medical Management of Chronic Issues    50mth follow-up    HPI: Patient is a 83 y.o. male seen today for medical management of chronic diseases.  Lives at [facility].  Goes to the gym also there.    Janett Billow had ordered some PT for him for a month.  He is walking much better.  He has plans to visit his daughter in [place].  No falls.  Eating better.  Taking smaller bites.    Sleeping well.    Knee is about the same.  Still painful, but stronger.  He has learned to walk more upright with the walker.  He is going to the [employer].    Still has some trouble with his dizziness, but exercise seems to maybe help.  Comes and goes.    Past Medical History:  Diagnosis Date  . A-fib ([place])   . Aortic valve insufficiency, acquired   . Arthritis   . BPH (benign prostatic hyperplasia)   . Cerebral ischemia   . Colon cancer [facility])     s/p partial colectomy  . Complication of anesthesia    "woke up w/confusion and hallucinations once after mitral valve OR"  . Coronary artery disease    50-70% LAD mitral valve repair 4+ yrs ago  . Depression   . Dyslipidemia   . Glaucoma   . Heart murmur   . Hernia   . High cholesterol   . History of echocardiogram    Echo 5/16:  Mild LVH, EF 50-55%, Gr 1 DD, septal HK, Ao sclerosis without stenosis, mild AI, MV repair ok with borderline mild MS (mean 4 mmHg), mild MR, mild LAE, mod RAE, mild TR, PASP 30 mmHg  . HTN (hypertension)    takes Amlodipine daily  . Hx of  cardiovascular stress test    Lexiscan Myoview 5/16:  Apical thinning, EF not gated, no ischemia. Low Risk  . Hyperlipidemia   . Major depression   . Mild cognitive impairment   . MVP (mitral valve prolapse)    S/P Rt mini thoractomy for Mitral Valve repair  . Nocturia   . Paroxysmal atrial fibrillation (HCC)    S/P Maze procedure  . 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 Prostate cancer ([place])   . 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 Shortness of breath dyspnea    with exertion  . Sleep disturbance   . Thoracic aortic aneurysm ([place])   . Urinary frequency   . Urinary urgency     Past Surgical History:  Procedure Laterality Date  . CARDIAC CATHETERIZATION    . CATARACT EXTRACTION W/ INTRAOCULAR LENS  IMPLANT, BILATERAL Bilateral   . COLECTOMY  2004   Dr [name]  . DIRECT LARYNGOSCOPY N/A 01/15/2016   Procedure: DIRECT LARYNGOSCOPY WITH BIOPSY AND FROZEN SECTION;  Surgeon: Izora Gala, MD;  Location: [place];  Service: ENT;  Laterality: N/A;  .  FlexHD patch repair of chest wall hernia.  01/28/2011   [name]  . GASTROSTOMY W/ FEEDING TUBE    . IR GENERIC HISTORICAL  10/08/2016   IR GASTROSTOMY TUBE REMOVAL 10/08/2016 Sandi Mariscal, MD [facility]-INTERV RAD  . JOINT REPLACEMENT    . LARYNGETOMY N/A 01/15/2016   Procedure:  TOTAL LARYNGECTOMY;  Surgeon: Izora Gala, MD;  Location: [facility] OR;  Service: ENT;  Laterality: N/A;  . MAZE  12/27/2008   left side lesion set  . MICROLARYNGOSCOPY Right 01/29/2015   Procedure: MICROLARYNGOSCOPY WITH BIOSPY OF RIGHT VOCAL CORD;  Surgeon: Izora Gala, MD;  Location: [place];  Service: ENT;  Laterality: Right;  . MITRAL VALVE REPAIR  12/27/2008   complex valvuloplasty with 66mm Memo 3D annuloplasty ring via right minithoracotomy  . PECTORALIS FLAP Left 05/04/2016   Procedure: PECTORALIS FLAP to neck with possible, split thickness skin graft;  Surgeon: Loel Lofty Dillingham, DO;  Location: [place];   Service: Plastics;  Laterality: Left;  . PROSTATE BIOPSY    . SKIN SPLIT GRAFT Left 05/04/2016   Procedure: PECTORALIS MAJOR MYOCUTANEOUS FLAP RECONSTRUCTION OF PHARYNX AND SPLIT THICKNESS SKIN GRAFT;  Surgeon: Izora Gala, MD;  Location: [place];  Service: ENT;  Laterality: Left;  . TEE WITHOUT CARDIOVERSION N/A 01/03/2013   Procedure: TRANSESOPHAGEAL ECHOCARDIOGRAM (TEE);  Surgeon: Sueanne Margarita, MD;  Location: [place];  Service: Cardiovascular;  Laterality: N/A;  . TOTAL KNEE ARTHROPLASTY Right 2003  . TRACHEAL ESOPHAGEAL PUNCTURE REPAIR N/A 03/26/2016   Procedure: TRACHEAL ESOPHAGEAL PUNCTURE;  Surgeon: Izora Gala, MD;  Location: [facility] OR;  Service: ENT;  Laterality: N/A;  . TRACHEOESOPHAGEAL FISTULA REPAIR N/A 03/26/2016   Procedure: TRACHEO-ESOPHAGEAL   PUNCTURE,FISTULAR CLOSURE;  Surgeon: Izora Gala, MD;  [facility];  Service: ENT;  Laterality: N/A;  . TRACHEOESOPHAGEAL FISTULA REPAIR N/A 04/09/2016   Procedure: FISTULA REPAIR WITH  MUSCLE ROTATION FLAP;  Surgeon: Izora Gala, MD;  Location: [place];  Service: ENT;  Laterality: N/A;  . TRACHEOESOPHAGEAL FISTULA REPAIR N/A 07/27/2016   Procedure: CLOSURE OF FISTULA;  Surgeon: Izora Gala, MD;  Location: [place];  Service: ENT;  Laterality: N/A;  . TYMPANOPLASTY  1967   "? side"    Allergies  Allergen Reactions  . Citalopram Nausea Only  . Trazodone And Nefazodone Other (See Comments)    Dry mouth  . Zoloft [Sertraline Hcl] Other (See Comments)    dizzy    Outpatient Encounter Medications as of 07/08/2017  Medication Sig  . atorvastatin (LIPITOR) 40 MG tablet Take 1 tablet (40 mg total) by mouth daily.  . furosemide (LASIX) 40 MG tablet TAKE ONE TABLET BY MOUTH DAILY  . zolpidem (AMBIEN) 5 MG tablet TAKE ONE TABLET BY MOUTH EVERY NIGHT AT BEDTIME  . [DISCONTINUED] Cholecalciferol (VITAMIN D) 2000 units CAPS Give 2,000 Units by tube daily.    No facility-administered encounter medications on file as of 07/08/2017.     Review  of Systems:  Review of Systems  Constitutional: Negative for chills, fever and malaise/fatigue.  HENT: Positive for hearing loss.        Hearing aids,, swallowing improved now, could not find electrolarynx this am and using dry erase board   Eyes: Negative for blurred vision.       Glasses  Respiratory: Negative for cough and shortness of breath.   Cardiovascular: Negative for chest pain, palpitations and leg swelling.       Better with compression socks  Gastrointestinal: Negative for abdominal pain, blood in stool, constipation and  melena.  Genitourinary: Negative for dysuria.  Musculoskeletal: Positive for joint pain. Negative for falls.       Left posterior knee  Skin: Negative for itching and rash.  Neurological: Positive for dizziness. Negative for loss of consciousness and weakness.  Endo/Heme/Allergies: Bruises/bleeds easily.  Psychiatric/Behavioral: Positive for memory loss. Negative for depression. The patient has insomnia.        Controlled with his long term ambien--does not sleep w/o it    Health Maintenance  Topic Date Due  . TETANUS/TDAP  08/11/2023  . INFLUENZA VACCINE  Completed  . PNA vac Low Risk Adult  Completed    Physical Exam: Vitals:   07/08/17 0812  BP: 138/60  Pulse: 78  Temp: 97.9 F (36.6 C)  TempSrc: Oral  Weight: 178 lb (80.7 kg)   Body mass index is 24.14 kg/m. Physical Exam  Constitutional: He is oriented to person, place, and time. He appears well-developed and well-nourished. No distress.  Cardiovascular: Intact distal pulses.  irreg irreg today  Pulmonary/Chest: Effort normal and breath sounds normal. No respiratory distress. He has no rales.  Abdominal: Soft. Bowel sounds are normal. He exhibits no distension. There is no tenderness.  Musculoskeletal:  Needs reminders to stand up straight with rollator walker, using knee brace and walking much faster and more steadily  Neurological: He is alert and oriented to person, place, and  time. No cranial nerve deficit.  Skin: Skin is warm and dry. Capillary refill takes less than 2 seconds.  Blue marker around nose from wiping it while using dry erase board with blue marker  Psychiatric: He has a normal mood and affect.    Labs reviewed: Basic Metabolic Panel: Recent Labs    12/06/16 1048  03/23/17 1422 03/29/17 1401 04/20/17 0953  NA 138   < > 140 140 142  K 3.8   < > 5.3 4.5 4.6  CL 109   < > 107 105 104  CO2 21*   < > 21 22 24   GLUCOSE 88   < > 97 86 92  BUN 22*   < > 22 24 16   CREATININE 0.88   < > 1.43* 1.26* 1.11  CALCIUM 7.8*   < > 8.8 8.7 8.9  MG 2.1  --   --   --   --   PHOS 2.9  --   --   --   --   TSH  --   --  7.31*  --   --    < > = values in this interval not displayed.   Liver Function Tests: Recent Labs    12/06/16 1048 12/08/16 0238 03/23/17 1422  AST 21 17 18   ALT 19 18 15   ALKPHOS 64 68 94  BILITOT 1.3* 0.8 1.1  PROT 5.9* 5.9* 6.2  ALBUMIN 2.6* 2.4* 3.7   No results for input(s): LIPASE, AMYLASE in the last 8760 hours. No results for input(s): AMMONIA in the last 8760 hours. CBC: Recent Labs    12/06/16 0910 12/06/16 1350 12/08/16 0238 03/23/17 1422  WBC 9.8 8.5 8.9 6.9  NEUTROABS 7.5  --   --  4,692  HGB 13.9 14.3 13.3 14.7  HCT 41.7 43.1 40.0 44.1  MCV 97.4 98.2 95.0 96.9  PLT 156 159 187 141   Lipid Panel: Recent Labs    12/21/16 0949  CHOL 141  HDL 52  LDLCALC 77  TRIG 62  CHOLHDL 2.7   Lab Results  Component Value Date   HGBA1C  2020-01-2909    5.3 (NOTE) The ADA recommends the following therapeutic goal for glycemic control related to Hgb A1c measurement: Goal of therapy: <6.5 Hgb A1c  Reference: American Diabetes Association: Clinical Practice Recommendations 2010, Diabetes Care, 2010, 33: (Suppl  1).    Assessment/Plan 1. Baker cyst, left -s/p multiple aspirations and injections, uses brace for support and walks with rollator walker  2. Insomnia, unspecified type - does well on the ambien which  does not seem to affect his cognitive status (tried to take him off before, but not successful) - zolpidem (AMBIEN) 5 MG tablet; TAKE ONE TABLET BY MOUTH EVERY NIGHT AT BEDTIME  Dispense: 30 tablet; Refill: 3  3. Protein-calorie malnutrition, severe - seems improved, eating well, had this in context of his multiple larynx surgeries for cancer - Prealbumin   4. Chronic systolic congestive heart failure (HCC) -cont to watch sodium in diet (his daughter is going to work with him on this now) and wear compression hose for edema - CBC with Differential/Platelet - Basic metabolic panel  5. Paroxysmal atrial fibrillation (HCC) -likely contributes to the balance problems at times, is in afib today it seems, but he denies symptoms  6. Balance problems - doing better with rollator walker - CBC with Differential/Platelet - Basic metabolic panel  7. Hypothyroidism, unspecified type - not on meds, but last tsh elevated several months ago when ill, recheck today - TSH  Labs/tests ordered:   Orders Placed This Encounter  Procedures  . CBC with Differential/Platelet  . Basic metabolic panel    Order Specific Question:   Has the patient fasted?    Answer:   Yes  . TSH  . Prealbumin   Next appt:  4 mos med mgt with me  Ara Grandmaison L. Froylan Hobby, D.O. [facility] [address], [place] [address] Cell Phone (Mon-Fri 8am-5pm):  [phone] On Call:  [phone] & follow prompts after 5pm & weekends Office Phone:  [phone] Office Fax:  [phone]

## 2017-07-09 LAB — BASIC METABOLIC PANEL
BUN/Creatinine Ratio: 19 (calc) (ref 6–22)
BUN: 22 mg/dL (ref 7–25)
CO2: 28 mmol/L (ref 20–32)
Calcium: 9.2 mg/dL (ref 8.6–10.3)
Chloride: 104 mmol/L (ref 98–110)
Creat: 1.13 mg/dL — ABNORMAL HIGH (ref 0.70–1.11)
Glucose, Bld: 92 mg/dL (ref 65–99)
Potassium: 4.4 mmol/L (ref 3.5–5.3)
Sodium: 141 mmol/L (ref 135–146)

## 2017-07-09 LAB — TEST AUTHORIZATION

## 2017-07-09 LAB — TSH: TSH: 8.07 mIU/L — ABNORMAL HIGH (ref 0.40–4.50)

## 2017-07-09 LAB — CBC WITH DIFFERENTIAL/PLATELET
Basophils Absolute: 38 cells/uL (ref 0–200)
Basophils Relative: 0.5 %
Eosinophils Absolute: 113 cells/uL (ref 15–500)
Eosinophils Relative: 1.5 %
HCT: 43.2 % (ref 38.5–50.0)
Hemoglobin: 14.4 g/dL (ref 13.2–17.1)
Lymphs Abs: 1418 cells/uL (ref 850–3900)
MCH: 31.2 pg (ref 27.0–33.0)
MCHC: 33.3 g/dL (ref 32.0–36.0)
MCV: 93.7 fL (ref 80.0–100.0)
MPV: 10.4 fL (ref 7.5–12.5)
Monocytes Relative: 8.1 %
Neutro Abs: 5325 cells/uL (ref 1500–7800)
Neutrophils Relative %: 71 %
Platelets: 182 10*3/uL (ref 140–400)
RBC: 4.61 10*6/uL (ref 4.20–5.80)
RDW: 12.9 % (ref 11.0–15.0)
Total Lymphocyte: 18.9 %
WBC mixed population: 608 cells/uL (ref 200–950)
WBC: 7.5 10*3/uL (ref 3.8–10.8)

## 2017-07-09 LAB — T4, FREE: Free T4: 0.9 ng/dL (ref 0.8–1.8)

## 2017-07-09 LAB — PREALBUMIN: Prealbumin: 20 mg/dL — ABNORMAL LOW (ref 21–43)

## 2017-07-25 DIAGNOSIS — Z9002 Acquired absence of larynx: Secondary | ICD-10-CM | POA: Diagnosis not present

## 2017-07-25 DIAGNOSIS — C320 Malignant neoplasm of glottis: Secondary | ICD-10-CM | POA: Diagnosis not present

## 2017-08-06 ENCOUNTER — Telehealth: Payer: Self-pay | Admitting: *Deleted

## 2017-08-06 ENCOUNTER — Other Ambulatory Visit: Payer: Self-pay | Admitting: Internal Medicine

## 2017-08-06 DIAGNOSIS — M7122 Synovial cyst of popliteal space [Baker], left knee: Secondary | ICD-10-CM

## 2017-08-06 NOTE — Progress Notes (Signed)
[place] with aspiration and injection of left popliteal cyst ordered to be done by IR.

## 2017-08-06 NOTE — Telephone Encounter (Signed)
Timothy Suarez, daughter called and stated that patient's Left Knee Baker's Cyst is hurting again and needs it drained. Wants you to place an order to [facility] to have this done.

## 2017-08-10 ENCOUNTER — Encounter (HOSPITAL_COMMUNITY): Payer: Self-pay | Admitting: *Deleted

## 2017-08-10 ENCOUNTER — Inpatient Hospital Stay (HOSPITAL_COMMUNITY)
Admission: EM | Admit: 2017-08-10 | Discharge: 2017-08-19 | DRG: 291 | Disposition: A | Discharge status: Skilled Nursing Facility | Payer: Medicare Other | Attending: Family Medicine | Admitting: Family Medicine

## 2017-08-10 ENCOUNTER — Emergency Department (HOSPITAL_COMMUNITY): Payer: Medicare Other

## 2017-08-10 ENCOUNTER — Other Ambulatory Visit: Payer: Self-pay

## 2017-08-10 DIAGNOSIS — I5023 Acute on chronic systolic (congestive) heart failure: Secondary | ICD-10-CM | POA: Diagnosis present

## 2017-08-10 DIAGNOSIS — R609 Edema, unspecified: Secondary | ICD-10-CM

## 2017-08-10 DIAGNOSIS — E785 Hyperlipidemia, unspecified: Secondary | ICD-10-CM | POA: Diagnosis not present

## 2017-08-10 DIAGNOSIS — I2510 Atherosclerotic heart disease of native coronary artery without angina pectoris: Secondary | ICD-10-CM | POA: Diagnosis present

## 2017-08-10 DIAGNOSIS — Z807 Family history of other malignant neoplasms of lymphoid, hematopoietic and related tissues: Secondary | ICD-10-CM

## 2017-08-10 DIAGNOSIS — I708 Atherosclerosis of other arteries: Secondary | ICD-10-CM | POA: Diagnosis not present

## 2017-08-10 DIAGNOSIS — M6281 Muscle weakness (generalized): Secondary | ICD-10-CM | POA: Diagnosis not present

## 2017-08-10 DIAGNOSIS — I509 Heart failure, unspecified: Secondary | ICD-10-CM | POA: Diagnosis not present

## 2017-08-10 DIAGNOSIS — N183 Chronic kidney disease, stage 3 (moderate): Secondary | ICD-10-CM | POA: Diagnosis present

## 2017-08-10 DIAGNOSIS — I70202 Unspecified atherosclerosis of native arteries of extremities, left leg: Secondary | ICD-10-CM | POA: Diagnosis present

## 2017-08-10 DIAGNOSIS — Z96651 Presence of right artificial knee joint: Secondary | ICD-10-CM | POA: Diagnosis not present

## 2017-08-10 DIAGNOSIS — Z8521 Personal history of malignant neoplasm of larynx: Secondary | ICD-10-CM

## 2017-08-10 DIAGNOSIS — R21 Rash and other nonspecific skin eruption: Secondary | ICD-10-CM | POA: Diagnosis not present

## 2017-08-10 DIAGNOSIS — M7120 Synovial cyst of popliteal space [Baker], unspecified knee: Secondary | ICD-10-CM

## 2017-08-10 DIAGNOSIS — I130 Hypertensive heart and chronic kidney disease with heart failure and stage 1 through stage 4 chronic kidney disease, or unspecified chronic kidney disease: Secondary | ICD-10-CM | POA: Diagnosis not present

## 2017-08-10 DIAGNOSIS — Z930 Tracheostomy status: Secondary | ICD-10-CM

## 2017-08-10 DIAGNOSIS — R1110 Vomiting, unspecified: Secondary | ICD-10-CM | POA: Diagnosis not present

## 2017-08-10 DIAGNOSIS — I071 Rheumatic tricuspid insufficiency: Secondary | ICD-10-CM | POA: Diagnosis not present

## 2017-08-10 DIAGNOSIS — R0602 Shortness of breath: Secondary | ICD-10-CM | POA: Diagnosis not present

## 2017-08-10 DIAGNOSIS — R946 Abnormal results of thyroid function studies: Secondary | ICD-10-CM | POA: Diagnosis present

## 2017-08-10 DIAGNOSIS — M7122 Synovial cyst of popliteal space [Baker], left knee: Secondary | ICD-10-CM | POA: Diagnosis not present

## 2017-08-10 DIAGNOSIS — Z8546 Personal history of malignant neoplasm of prostate: Secondary | ICD-10-CM

## 2017-08-10 DIAGNOSIS — I5040 Unspecified combined systolic (congestive) and diastolic (congestive) heart failure: Secondary | ICD-10-CM | POA: Diagnosis not present

## 2017-08-10 DIAGNOSIS — Z430 Encounter for attention to tracheostomy: Secondary | ICD-10-CM | POA: Diagnosis not present

## 2017-08-10 DIAGNOSIS — G3184 Mild cognitive impairment, so stated: Secondary | ICD-10-CM | POA: Diagnosis present

## 2017-08-10 DIAGNOSIS — N179 Acute kidney failure, unspecified: Secondary | ICD-10-CM | POA: Diagnosis not present

## 2017-08-10 DIAGNOSIS — F05 Delirium due to known physiological condition: Secondary | ICD-10-CM | POA: Diagnosis not present

## 2017-08-10 DIAGNOSIS — I471 Supraventricular tachycardia: Secondary | ICD-10-CM | POA: Diagnosis present

## 2017-08-10 DIAGNOSIS — I724 Aneurysm of artery of lower extremity: Secondary | ICD-10-CM | POA: Diagnosis not present

## 2017-08-10 DIAGNOSIS — Z85038 Personal history of other malignant neoplasm of large intestine: Secondary | ICD-10-CM

## 2017-08-10 DIAGNOSIS — R001 Bradycardia, unspecified: Secondary | ICD-10-CM | POA: Diagnosis not present

## 2017-08-10 DIAGNOSIS — I4581 Long QT syndrome: Secondary | ICD-10-CM | POA: Diagnosis present

## 2017-08-10 DIAGNOSIS — E876 Hypokalemia: Secondary | ICD-10-CM | POA: Diagnosis not present

## 2017-08-10 DIAGNOSIS — M7121 Synovial cyst of popliteal space [Baker], right knee: Secondary | ICD-10-CM | POA: Diagnosis not present

## 2017-08-10 DIAGNOSIS — R5381 Other malaise: Secondary | ICD-10-CM | POA: Diagnosis present

## 2017-08-10 DIAGNOSIS — R7989 Other specified abnormal findings of blood chemistry: Secondary | ICD-10-CM | POA: Diagnosis not present

## 2017-08-10 DIAGNOSIS — J392 Other diseases of pharynx: Secondary | ICD-10-CM | POA: Diagnosis present

## 2017-08-10 DIAGNOSIS — R443 Hallucinations, unspecified: Secondary | ICD-10-CM | POA: Diagnosis present

## 2017-08-10 DIAGNOSIS — J189 Pneumonia, unspecified organism: Secondary | ICD-10-CM | POA: Diagnosis present

## 2017-08-10 DIAGNOSIS — M179 Osteoarthritis of knee, unspecified: Secondary | ICD-10-CM | POA: Diagnosis not present

## 2017-08-10 DIAGNOSIS — I255 Ischemic cardiomyopathy: Secondary | ICD-10-CM | POA: Diagnosis present

## 2017-08-10 DIAGNOSIS — M25462 Effusion, left knee: Secondary | ICD-10-CM

## 2017-08-10 DIAGNOSIS — Z8673 Personal history of transient ischemic attack (TIA), and cerebral infarction without residual deficits: Secondary | ICD-10-CM

## 2017-08-10 DIAGNOSIS — I5022 Chronic systolic (congestive) heart failure: Secondary | ICD-10-CM | POA: Diagnosis not present

## 2017-08-10 DIAGNOSIS — I351 Nonrheumatic aortic (valve) insufficiency: Secondary | ICD-10-CM | POA: Diagnosis not present

## 2017-08-10 DIAGNOSIS — D696 Thrombocytopenia, unspecified: Secondary | ICD-10-CM | POA: Diagnosis present

## 2017-08-10 DIAGNOSIS — J80 Acute respiratory distress syndrome: Secondary | ICD-10-CM | POA: Diagnosis not present

## 2017-08-10 DIAGNOSIS — R112 Nausea with vomiting, unspecified: Secondary | ICD-10-CM | POA: Diagnosis not present

## 2017-08-10 DIAGNOSIS — I5043 Acute on chronic combined systolic (congestive) and diastolic (congestive) heart failure: Secondary | ICD-10-CM | POA: Diagnosis present

## 2017-08-10 DIAGNOSIS — Z9049 Acquired absence of other specified parts of digestive tract: Secondary | ICD-10-CM

## 2017-08-10 DIAGNOSIS — R262 Difficulty in walking, not elsewhere classified: Secondary | ICD-10-CM | POA: Diagnosis not present

## 2017-08-10 DIAGNOSIS — R488 Other symbolic dysfunctions: Secondary | ICD-10-CM | POA: Diagnosis not present

## 2017-08-10 DIAGNOSIS — I361 Nonrheumatic tricuspid (valve) insufficiency: Secondary | ICD-10-CM | POA: Diagnosis not present

## 2017-08-10 DIAGNOSIS — M1990 Unspecified osteoarthritis, unspecified site: Secondary | ICD-10-CM | POA: Diagnosis not present

## 2017-08-10 LAB — TROPONIN I: TROPONIN I: 0.04 ng/mL — AB (ref <0.03)

## 2017-08-10 LAB — CBC
HCT: 46.8 % (ref 39.0–52.0)
HCT: 45.1 % (ref 39.0–52.0)
HEMOGLOBIN: 15.5 g/dL (ref 13.0–17.0)
HEMOGLOBIN: 15.3 g/dL (ref 13.0–17.0)
MCH: 32.7 pg (ref 26.0–34.0)
MCH: 33.2 pg (ref 26.0–34.0)
MCHC: 33.1 g/dL (ref 30.0–36.0)
MCHC: 33.9 g/dL (ref 30.0–36.0)
MCV: 98.7 fL (ref 78.0–100.0)
MCV: 97.8 fL (ref 78.0–100.0)
Platelets: 137 10*3/uL — ABNORMAL LOW (ref 150–400)
Platelets: 129 10*3/uL — ABNORMAL LOW (ref 150–400)
RBC: 4.74 MIL/uL (ref 4.22–5.81)
RBC: 4.61 MIL/uL (ref 4.22–5.81)
RDW: 15.5 % (ref 11.5–15.5)
RDW: 15.4 % (ref 11.5–15.5)
WBC: 7.2 10*3/uL (ref 4.0–10.5)
WBC: 7 10*3/uL (ref 4.0–10.5)

## 2017-08-10 LAB — HEPATIC FUNCTION PANEL
ALT: 15 U/L — AB (ref 17–63)
AST: 23 U/L (ref 15–41)
Albumin: 3.4 g/dL — ABNORMAL LOW (ref 3.5–5.0)
Alkaline Phosphatase: 93 U/L (ref 38–126)
BILIRUBIN DIRECT: 0.4 mg/dL (ref 0.1–0.5)
BILIRUBIN TOTAL: 1.4 mg/dL — AB (ref 0.3–1.2)
Indirect Bilirubin: 1 mg/dL — ABNORMAL HIGH (ref 0.3–0.9)
Total Protein: 6.4 g/dL — ABNORMAL LOW (ref 6.5–8.1)

## 2017-08-10 LAB — BASIC METABOLIC PANEL
ANION GAP: 14 (ref 5–15)
BUN: 35 mg/dL — ABNORMAL HIGH (ref 6–20)
CO2: 17 mmol/L — AB (ref 22–32)
Calcium: 9 mg/dL (ref 8.9–10.3)
Chloride: 105 mmol/L (ref 101–111)
Creatinine, Ser: 1.65 mg/dL — ABNORMAL HIGH (ref 0.61–1.24)
GFR calc non Af Amer: 35 mL/min — ABNORMAL LOW (ref >60)
GFR, EST AFRICAN AMERICAN: 41 mL/min — AB (ref >60)
Glucose, Bld: 96 mg/dL (ref 65–99)
Potassium: 4.5 mmol/L (ref 3.5–5.1)
Sodium: 136 mmol/L (ref 135–145)

## 2017-08-10 LAB — CREATININE, SERUM
Creatinine, Ser: 1.53 mg/dL — ABNORMAL HIGH (ref 0.61–1.24)
GFR calc Af Amer: 44 mL/min — ABNORMAL LOW (ref >60)
GFR, EST NON AFRICAN AMERICAN: 38 mL/min — AB (ref >60)

## 2017-08-10 LAB — BRAIN NATRIURETIC PEPTIDE: B Natriuretic Peptide: 2687.6 pg/mL — ABNORMAL HIGH (ref 0.0–100.0)

## 2017-08-10 MED ORDER — ASPIRIN EC 81 MG PO TBEC
81.0000 mg | DELAYED_RELEASE_TABLET | Freq: Every day | ORAL | Status: DC
  Administered 2017-08-10 – 2017-08-19 (×10): 81 mg via ORAL
  Filled 2017-08-10 (×10): qty 1

## 2017-08-10 MED ORDER — ACETAMINOPHEN 325 MG PO TABS
650.0000 mg | ORAL_TABLET | ORAL | Status: DC | PRN
  Administered 2017-08-14: 650 mg via ORAL
  Filled 2017-08-10: qty 2

## 2017-08-10 MED ORDER — SODIUM CHLORIDE 0.9% FLUSH
3.0000 mL | Freq: Two times a day (BID) | INTRAVENOUS | Status: DC
  Administered 2017-08-10 – 2017-08-19 (×15): 3 mL via INTRAVENOUS

## 2017-08-10 MED ORDER — ENOXAPARIN SODIUM 40 MG/0.4ML ~~LOC~~ SOLN
40.0000 mg | SUBCUTANEOUS | Status: DC
  Administered 2017-08-10 – 2017-08-11 (×2): 40 mg via SUBCUTANEOUS
  Filled 2017-08-10 (×3): qty 0.4

## 2017-08-10 MED ORDER — LEVOFLOXACIN IN D5W 500 MG/100ML IV SOLN
500.0000 mg | INTRAVENOUS | Status: DC
  Administered 2017-08-10: 500 mg via INTRAVENOUS
  Filled 2017-08-10: qty 100

## 2017-08-10 MED ORDER — FUROSEMIDE 10 MG/ML IJ SOLN
40.0000 mg | Freq: Two times a day (BID) | INTRAMUSCULAR | Status: DC
  Administered 2017-08-10 – 2017-08-14 (×8): 40 mg via INTRAVENOUS
  Filled 2017-08-10 (×7): qty 4

## 2017-08-10 MED ORDER — CARVEDILOL 3.125 MG PO TABS
3.1250 mg | ORAL_TABLET | Freq: Two times a day (BID) | ORAL | Status: DC
  Administered 2017-08-10 – 2017-08-15 (×8): 3.125 mg via ORAL
  Filled 2017-08-10 (×10): qty 1

## 2017-08-10 MED ORDER — LISINOPRIL 2.5 MG PO TABS
2.5000 mg | ORAL_TABLET | Freq: Every day | ORAL | Status: DC
  Administered 2017-08-10 – 2017-08-19 (×10): 2.5 mg via ORAL
  Filled 2017-08-10 (×10): qty 1

## 2017-08-10 MED ORDER — ZOLPIDEM TARTRATE 5 MG PO TABS
5.0000 mg | ORAL_TABLET | Freq: Every evening | ORAL | Status: DC | PRN
  Administered 2017-08-11: 5 mg via ORAL
  Filled 2017-08-10: qty 1

## 2017-08-10 MED ORDER — FUROSEMIDE 10 MG/ML IJ SOLN
40.0000 mg | INTRAMUSCULAR | Status: AC
  Administered 2017-08-10: 40 mg via INTRAVENOUS
  Filled 2017-08-10: qty 4

## 2017-08-10 MED ORDER — ATORVASTATIN CALCIUM 40 MG PO TABS
40.0000 mg | ORAL_TABLET | Freq: Every day | ORAL | Status: DC
  Administered 2017-08-10 – 2017-08-19 (×10): 40 mg via ORAL
  Filled 2017-08-10 (×10): qty 1

## 2017-08-10 MED ORDER — SODIUM CHLORIDE 0.9% FLUSH: 3.0000 mL | INTRAVENOUS | Status: DC | PRN

## 2017-08-10 MED ORDER — SODIUM CHLORIDE 0.9 % IV SOLN: 250.0000 mL | INTRAVENOUS | Status: DC | PRN

## 2017-08-10 NOTE — ED Notes (Addendum)
Pt placed on hospital bed

## 2017-08-10 NOTE — ED Triage Notes (Signed)
The pt lives alone  heis daughter saw him today and he was seeing things and talking put of his head  She then saw that both his legs and feet were extremely swollen  Alert  He has a trach

## 2017-08-10 NOTE — ED Provider Notes (Signed)
[place] EMERGENCY DEPARTMENT Provider Note   CSN: [id number] Arrival date & time: 08/10/17  1528     History   Chief Complaint Chief Complaint  Patient presents with  . Shortness of Breath    HPI Timothy Suarez is a 84 y.o. male.  HPI  Patient is a 84 year old male, very pleasant, lives independently at [facility] in a progressive care community, he has a history of atrial fibrillation as well as history of stroke, colon cancer status post partial colectomy, history of coronary disease with a mitral valve repair in the past, history of an echocardiogram with an ejection fraction of 50-55% but no congestive heart failure.  He also takes amlodipine for his high blood pressure, he has had vocal cord cancer and has had a tracheostomy which is been present for a couple of years according to his daughter who is the primary historian is the patient is unable to speak secondary to his tracheostomy.  She reports that he has had some increased coughing however she has noticed a decline in the last week where he has been less functional, he has been having some hallucinations and he has been appearing more short of breath with more coughing.  The patient does endorse this with nods however he is unable to give any verbal information.  The daughter is noticed an extreme weight gain especially with his legs becoming severely edematous, she is noted that he has had some cyanotic appearance to his nailbeds and appears very cold and has noted that he has been having some visual hallucinations and is able to communicate with her that he thinks that someone else's in the car when they are not.  There has been no auditory hallucinations and no prior history of schizophrenia or other mental health disease.  There is been no new medications started.  He has become generally weak and has been asking people to take him to the cafeteria as he is unable to make it there himself.  In the  last couple of days he has not been asking for this as there has been some confusion and the daughter has been having to check on him to make sure that food is coming to him.  As far as we know there is been no fevers vomiting or diarrhea and he does not complain of any pain  Past Medical History:  Diagnosis Date  . A-fib ([place])   . Aortic valve insufficiency, acquired   . Arthritis   . BPH (benign prostatic hyperplasia)   . Cerebral ischemia   . Colon cancer [facility])     s/p partial colectomy  . Complication of anesthesia    "woke up w/confusion and hallucinations once after mitral valve OR"  . Coronary artery disease    50-70% LAD mitral valve repair 4+ yrs ago  . Depression   . Dyslipidemia   . Glaucoma   . Heart murmur   . Hernia   . High cholesterol   . History of echocardiogram    Echo 5/16:  Mild LVH, EF 50-55%, Gr 1 DD, septal HK, Ao sclerosis without stenosis, mild AI, MV repair ok with borderline mild MS (mean 4 mmHg), mild MR, mild LAE, mod RAE, mild TR, PASP 30 mmHg  . HTN (hypertension)    takes Amlodipine daily  . Hx of cardiovascular stress test    Lexiscan Myoview 5/16:  Apical thinning, EF not gated, no ischemia. Low Risk  . Hyperlipidemia   . Major  depression   . Mild cognitive impairment   . MVP (mitral valve prolapse)    S/P Rt mini thoractomy for Mitral Valve repair  . Nocturia   . Paroxysmal atrial fibrillation (HCC)    S/P Maze procedure  . 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 Prostate cancer ([place])   . 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 Shortness of breath dyspnea    with exertion  . Sleep disturbance   . Thoracic aortic aneurysm ([place])   . Urinary frequency   . Urinary urgency     Patient Active Problem List   Diagnosis Date Noted  . Balance problems 07/08/2017  . Baker cyst, left 07/08/2017  . Dilated cardiomyopathy ([place]) 04/19/2017  . Chronic systolic congestive  heart failure ([place]) 03/30/2017  . Cough   . Pulmonary edema 12/06/2016  . H/O laryngectomy 03/26/2016  . Protein-calorie malnutrition, severe 02/26/2016  . Pharyngocutaneous fistula 02/21/2016  . S/P laryngectomy 01/15/2016  . S/P biopsy 01/15/2016  . Baker's cyst of knee 12/09/2015  . Other fatigue 12/09/2015  . 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 Prostate cancer ([place]) 01/10/2015  . S/P MVR (mitral valve repair) 12/20/2014  . S/P Maze operation for atrial fibrillation 12/20/2014  . Obesity (BMI 30-39.9) 04/02/2014  . Prolonged Q-T interval on ECG 04/02/2014  . Mild cognitive impairment 04/02/2014  . Major depressive disorder, recurrent episode, mild ([place]) 02/08/2014  . Insomnia 02/08/2014  . Dizziness and giddiness 02/08/2014  . Generalized muscle weakness 02/08/2014  . Cerumen impaction 02/08/2014  . Glaucoma 02/08/2014  . Colon cancer ([place])   . Coronary artery disease   . Moderate aortic insufficiency 01/03/2013  . Mild mitral regurgitation 01/03/2013  . MVP (mitral valve prolapse)   . Paroxysmal atrial fibrillation (HCC)   . HTN (hypertension)   . Depression   . chest wall hernia (lung hernia)     Past Surgical History:  Procedure Laterality Date  . CARDIAC CATHETERIZATION    . CATARACT EXTRACTION W/ INTRAOCULAR LENS  IMPLANT, BILATERAL Bilateral   . COLECTOMY  2004   Dr [name]  . DIRECT LARYNGOSCOPY N/A 01/15/2016   Procedure: DIRECT LARYNGOSCOPY WITH BIOPSY AND FROZEN SECTION;  Surgeon: Izora Gala, MD;  Location: [place];  Service: ENT;  Laterality: N/A;  . FlexHD patch repair of chest wall hernia.  01/28/2011   [name]  . GASTROSTOMY W/ FEEDING TUBE    . IR GENERIC HISTORICAL  10/08/2016   IR GASTROSTOMY TUBE REMOVAL 10/08/2016 Sandi Mariscal, MD [facility]-INTERV RAD  . JOINT REPLACEMENT    . LARYNGETOMY N/A 01/15/2016   Procedure:  TOTAL LARYNGECTOMY;  Surgeon: Izora Gala, MD;  Location: [facility] OR;  Service: ENT;  Laterality: N/A;  . MAZE   12/27/2008   left side lesion set  . MICROLARYNGOSCOPY Right 01/29/2015   Procedure: MICROLARYNGOSCOPY WITH BIOSPY OF RIGHT VOCAL CORD;  Surgeon: Izora Gala, MD;  Location: [place];  Service: ENT;  Laterality: Right;  . MITRAL VALVE REPAIR  12/27/2008   complex valvuloplasty with 84mm Memo 3D annuloplasty ring via right minithoracotomy  . PECTORALIS FLAP Left 05/04/2016   Procedure: PECTORALIS FLAP to neck with possible, split thickness skin graft;  Surgeon: Loel Lofty Dillingham, DO;  Location: [place];  Service: Plastics;  Laterality: Left;  . PROSTATE BIOPSY    . SKIN SPLIT GRAFT Left 05/04/2016   Procedure: PECTORALIS MAJOR MYOCUTANEOUS FLAP RECONSTRUCTION OF  PHARYNX AND SPLIT THICKNESS SKIN GRAFT;  Surgeon: Izora Gala, MD;  Location: [place];  Service: ENT;  Laterality: Left;  . TEE WITHOUT CARDIOVERSION N/A 01/03/2013   Procedure: TRANSESOPHAGEAL ECHOCARDIOGRAM (TEE);  Surgeon: Sueanne Margarita, MD;  Location: [place];  Service: Cardiovascular;  Laterality: N/A;  . TOTAL KNEE ARTHROPLASTY Right 2003  . TRACHEAL ESOPHAGEAL PUNCTURE REPAIR N/A 03/26/2016   Procedure: TRACHEAL ESOPHAGEAL PUNCTURE;  Surgeon: Izora Gala, MD;  Location: [facility] OR;  Service: ENT;  Laterality: N/A;  . TRACHEOESOPHAGEAL FISTULA REPAIR N/A 03/26/2016   Procedure: TRACHEO-ESOPHAGEAL   PUNCTURE,FISTULAR CLOSURE;  Surgeon: Izora Gala, MD;  Location: [facility] OR;  Service: ENT;  Laterality: N/A;  . TRACHEOESOPHAGEAL FISTULA REPAIR N/A 04/09/2016   Procedure: FISTULA REPAIR WITH  MUSCLE ROTATION FLAP;  Surgeon: Izora Gala, MD;  Location: [place];  Service: ENT;  Laterality: N/A;  . TRACHEOESOPHAGEAL FISTULA REPAIR N/A 07/27/2016   Procedure: CLOSURE OF FISTULA;  Surgeon: Izora Gala, MD;  Location: [place];  Service: ENT;  Laterality: N/A;  . TYMPANOPLASTY  1967   "? side"       Home Medications    Prior to Admission medications   Medication Sig Start Date End Date Taking? Authorizing Provider  atorvastatin (LIPITOR) 40 MG tablet  Take 1 tablet (40 mg total) by mouth daily. 04/07/17  Yes Reed, Tiffany L, DO  furosemide (LASIX) 40 MG tablet TAKE ONE TABLET BY MOUTH DAILY 05/18/17  Yes Reed, Tiffany L, DO  zolpidem (AMBIEN) 5 MG tablet TAKE ONE TABLET BY MOUTH EVERY NIGHT AT BEDTIME 07/08/17  Yes Reed, Tiffany L, DO    Family History Family History  Problem Relation Age of Onset  . Cancer Mother        lymphoma  . Cancer Father        pancreatic  . Heart attack Neg Hx   . Stroke Neg Hx     Social History Social History   Tobacco Use  . Smoking status: Never Smoker  . Smokeless tobacco: Never Used  Substance Use Topics  . Alcohol use: Yes    Alcohol/week: 0.0 oz    Comment: rare  . Drug use: No     Allergies   Ambien [zolpidem]; Citalopram; Trazodone and nefazodone; and Zoloft [sertraline hcl]   Review of Systems Review of Systems  Unable to perform ROS: Mental status change     Physical Exam Updated Vital Signs BP 136/79   Pulse (!) 47   Temp (!) 97.5 F (36.4 C)   Resp (!) 29   Ht 6' (1.829 m)   Wt 81.6 kg (180 lb)   SpO2 99%   BMI 24.41 kg/m   Physical Exam  Constitutional: He appears well-developed and well-nourished. No distress.  HENT:  Head: Normocephalic and atraumatic.  Mouth/Throat: Oropharynx is clear and moist. No oropharyngeal exudate.  Eyes: Conjunctivae and EOM are normal. Pupils are equal, round, and reactive to light. Right eye exhibits no discharge. Left eye exhibits no discharge. No scleral icterus.  Neck: Normal range of motion. Neck supple. No JVD present. No thyromegaly present.  The ostomy is open, the patient uses suction by himself at home successfully most of the time, no surrounding redness or significant drainage or tenderness  Cardiovascular: Normal rate, normal heart sounds and intact distal pulses. Exam reveals no gallop and no friction rub.  No murmur heard. Some irregularity to the heart rate  Pulmonary/Chest: Effort normal. No respiratory distress. He  has no wheezes. He has rales.  The patient has had rales at the bases, no increased work of breathing  Abdominal: Soft. Bowel sounds are normal. He exhibits no distension and no mass. There is no tenderness.  Abdomen is soft and nontender  Musculoskeletal: Normal range of motion. He exhibits edema. He exhibits no tenderness.  There is pitting edema that reaches the top of the thighs bilaterally, the legs are symmetrical  Lymphadenopathy:    He has no cervical adenopathy.  Neurological: He is alert. Coordination normal.  The patient is able to follow commands without any significant difficulty, he is able to sit up in the bed though he appears generally weak and has a hard time doing this without assistance.  Skin: Skin is warm and dry. No rash noted. No erythema.  No obvious skin breakdown or rashes  Psychiatric: He has a normal mood and affect. His behavior is normal.  Nursing note and vitals reviewed.   ED Treatments / Results  Labs (all labs ordered are listed, but only abnormal results are displayed) Labs Reviewed  BASIC METABOLIC PANEL - Abnormal; Notable for the following components:      Result Value   CO2 17 (*)    BUN 35 (*)    Creatinine, Ser 1.65 (*)    GFR calc non Af Amer 35 (*)    GFR calc Af Amer 41 (*)    All other components within normal limits  CBC - Abnormal; Notable for the following components:   Platelets 137 (*)    All other components within normal limits  TROPONIN I - Abnormal; Notable for the following components:   Troponin I 0.04 (*)    All other components within normal limits  HEPATIC FUNCTION PANEL  BRAIN NATRIURETIC PEPTIDE   ED ECG REPORT  I personally interpreted this EKG   Date: 08/10/2017   Rate: 90  Rhythm: atrial fibrillation and premature ventricular contractions (PVC)  QRS Axis: right  Intervals: a fib, wide QRS  ST/T Wave abnormalities: nonspecific ST changes and nonspecific T wave changes  Conduction Disutrbances:right bundle  branch block  Narrative Interpretation:   Old EKG Reviewed: none available  Radiology Dg Chest Port 1 View  Result Date: 08/10/2017 CLINICAL DATA:  Shortness of breath EXAM: PORTABLE CHEST 1 VIEW COMPARISON:  March 24, 2017 FINDINGS: The mediastinal contour is normal. Heart size is enlarged. The aorta is tortuous. There is patchy consolidation of left lung base. Bilateral pleural effusions are noted. There is mild interstitial edema. The visualized skeletal structures are unremarkable. IMPRESSION: Mild congestive heart failure. Patchy consolidation of left lung base, pneumonia is not excluded. Small bilateral pleural effusions. Electronically Signed   By: Abelardo Diesel M.D.   On: 08/10/2017 17:49    Procedures Procedures (including critical care time)  Medications Ordered in ED Medications  furosemide (LASIX) injection 40 mg (40 mg Intravenous Given 08/10/17 1820)     Initial Impression / Assessment and Plan / ED Course  I have reviewed the triage vital signs and the nursing notes.  Pertinent labs & imaging results that were available during my care of the patient were reviewed by me and considered in my medical decision making (see chart for details).     The patient will need further evaluation, this could be related to worsening CHF pneumonia or other source.  EKG with no acute ischemia, frequent ectopy the oxygen seems normal but the patient is severely edematous.  Labs show some AKI  CXR with some edema D/w Dr. Jonelle Sidle who will admit  Lasix ordered  Final Clinical Impressions(s) / ED Diagnoses   Final diagnoses:  Congestive heart failure, unspecified HF chronicity, unspecified heart failure type [facility])    ED Discharge Orders    None       Noemi Chapel, MD 08/10/17 1846

## 2017-08-10 NOTE — H&P (Signed)
History and Physical    Timothy Suarez XBM:[id number] DOB: 1925-02-23 DOA: 08/10/2017  Referring MD/NP/PA: Dr. Sabra Heck PCP: Gayland Curry, DO   Outpatient Specialists: Dr. Radford Pax, cardiology   Patient coming from: Home  Chief Complaint: Hallucination and worsening lower extremity edema  HPI: Timothy Suarez is a 84 y.o. male with medical history significant of systolic dysfunction CHF with last EF of 25% here with  Progressive lower extremity edema as well as hallucinations. He has been on Lasix 40 mg daily at home. No change in his diet or medications. His daughter noted that he has had problem with forgetfulness but also mild hallucinations cc people when they're not actually there. He came in with 3+ pedal edema. He's had mild orthopnea and PND. Patient has tracheostomy in place as a result of laryngeal cancer with previous treatment including surgery. He self suctions himself to clear the secretions. He has been doing fine at home until these worsening of his lower extremity edema. Patient also has known coronary artery disease. His fully awakened, indicating at this point and no obvious sign of hallucination. No change in medications. He has been taken off blood pressure medications due to low blood pressure in the past so he is not on any cardiac medicines except for diuretics. Patient is however noted to have elevated diastole blood pressure in the ER.  ED Course: Patient was seen in the ED and evaluated. Chest x-ray shows mild congestive heart failure with small bilateral effusions, also patchy consolidation of left lung base pneumonia was not excluded. Patient has 3+ pedal edema and diuretics were given in the ER mainly IV Lasix 40 mg  Review of Systems: As per HPI otherwise 10 point review of systems negative.    Past Medical History:  Diagnosis Date  . A-fib ([place])   . Aortic valve insufficiency, acquired   . Arthritis   . BPH (benign prostatic hyperplasia)   . Cerebral  ischemia   . Colon cancer [facility])     s/p partial colectomy  . Complication of anesthesia    "woke up w/confusion and hallucinations once after mitral valve OR"  . Coronary artery disease    50-70% LAD mitral valve repair 4+ yrs ago  . Depression   . Dyslipidemia   . Glaucoma   . Heart murmur   . Hernia   . High cholesterol   . History of echocardiogram    Echo 5/16:  Mild LVH, EF 50-55%, Gr 1 DD, septal HK, Ao sclerosis without stenosis, mild AI, MV repair ok with borderline mild MS (mean 4 mmHg), mild MR, mild LAE, mod RAE, mild TR, PASP 30 mmHg  . HTN (hypertension)    takes Amlodipine daily  . Hx of cardiovascular stress test    Lexiscan Myoview 5/16:  Apical thinning, EF not gated, no ischemia. Low Risk  . Hyperlipidemia   . Major depression   . Mild cognitive impairment   . MVP (mitral valve prolapse)    S/P Rt mini thoractomy for Mitral Valve repair  . Nocturia   . Paroxysmal atrial fibrillation (HCC)    S/P Maze procedure  . 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 Prostate cancer ([place])   . 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 Shortness of breath dyspnea    with exertion  . Sleep disturbance   . Thoracic aortic aneurysm ([place])   . Urinary frequency   .  Urinary urgency     Past Surgical History:  Procedure Laterality Date  . CARDIAC CATHETERIZATION    . CATARACT EXTRACTION W/ INTRAOCULAR LENS  IMPLANT, BILATERAL Bilateral   . COLECTOMY  2004   Dr [name]  . DIRECT LARYNGOSCOPY N/A 01/15/2016   Procedure: DIRECT LARYNGOSCOPY WITH BIOPSY AND FROZEN SECTION;  Surgeon: Izora Gala, MD;  Location: [place];  Service: ENT;  Laterality: N/A;  . FlexHD patch repair of chest wall hernia.  01/28/2011   [name]  . GASTROSTOMY W/ FEEDING TUBE    . IR GENERIC HISTORICAL  10/08/2016   IR GASTROSTOMY TUBE REMOVAL 10/08/2016 Sandi Mariscal, MD [facility]-INTERV RAD  . JOINT REPLACEMENT    . LARYNGETOMY N/A 01/15/2016   Procedure:   TOTAL LARYNGECTOMY;  Surgeon: Izora Gala, MD;  Location: [facility] OR;  Service: ENT;  Laterality: N/A;  . MAZE  12/27/2008   left side lesion set  . MICROLARYNGOSCOPY Right 01/29/2015   Procedure: MICROLARYNGOSCOPY WITH BIOSPY OF RIGHT VOCAL CORD;  Surgeon: Izora Gala, MD;  Location: [place];  Service: ENT;  Laterality: Right;  . MITRAL VALVE REPAIR  12/27/2008   complex valvuloplasty with 1mm Memo 3D annuloplasty ring via right minithoracotomy  . PECTORALIS FLAP Left 05/04/2016   Procedure: PECTORALIS FLAP to neck with possible, split thickness skin graft;  Surgeon: Loel Lofty Dillingham, DO;  Location: [place];  Service: Plastics;  Laterality: Left;  . PROSTATE BIOPSY    . SKIN SPLIT GRAFT Left 05/04/2016   Procedure: PECTORALIS MAJOR MYOCUTANEOUS FLAP RECONSTRUCTION OF PHARYNX AND SPLIT THICKNESS SKIN GRAFT;  Surgeon: Izora Gala, MD;  Location: [place];  Service: ENT;  Laterality: Left;  . TEE WITHOUT CARDIOVERSION N/A 01/03/2013   Procedure: TRANSESOPHAGEAL ECHOCARDIOGRAM (TEE);  Surgeon: Sueanne Margarita, MD;  Location: [place];  Service: Cardiovascular;  Laterality: N/A;  . TOTAL KNEE ARTHROPLASTY Right 2003  . TRACHEAL ESOPHAGEAL PUNCTURE REPAIR N/A 03/26/2016   Procedure: TRACHEAL ESOPHAGEAL PUNCTURE;  Surgeon: Izora Gala, MD;  Location: [facility] OR;  Service: ENT;  Laterality: N/A;  . TRACHEOESOPHAGEAL FISTULA REPAIR N/A 03/26/2016   Procedure: TRACHEO-ESOPHAGEAL   PUNCTURE,FISTULAR CLOSURE;  Surgeon: Izora Gala, MD;  Location: [place];  Service: ENT;  Laterality: N/A;  . TRACHEOESOPHAGEAL FISTULA REPAIR N/A 04/09/2016   Procedure: FISTULA REPAIR WITH  MUSCLE ROTATION FLAP;  Surgeon: Izora Gala, MD;  Location: [place];  Service: ENT;  Laterality: N/A;  . TRACHEOESOPHAGEAL FISTULA REPAIR N/A 07/27/2016   Procedure: CLOSURE OF FISTULA;  Surgeon: Izora Gala, MD;  Location: [place];  Service: ENT;  Laterality: N/A;  . TYMPANOPLASTY  1967   "? side"     reports that  has never smoked. he has never used  smokeless tobacco. He reports that he drinks alcohol. He reports that he does not use drugs.  Allergies  Allergen Reactions  . Ambien [Zolpidem] Other (See Comments)    Causes confusion  . Citalopram Nausea Only  . Trazodone And Nefazodone Other (See Comments)    Dry mouth  . Zoloft [Sertraline Hcl] Other (See Comments)    dizzy    Family History  Problem Relation Age of Onset  . Cancer Mother        lymphoma  . Cancer Father        pancreatic  . Heart attack Neg Hx   . Stroke Neg Hx      Prior to Admission medications   Medication Sig Start Date End Date Taking? Authorizing Provider  atorvastatin (LIPITOR) 40 MG tablet Take  1 tablet (40 mg total) by mouth daily. 04/07/17  Yes Reed, Tiffany L, DO  furosemide (LASIX) 40 MG tablet TAKE ONE TABLET BY MOUTH DAILY 05/18/17  Yes Reed, Tiffany L, DO  zolpidem (AMBIEN) 5 MG tablet TAKE ONE TABLET BY MOUTH EVERY NIGHT AT BEDTIME 07/08/17  Yes Gayland Curry, DO    Physical Exam: Vitals:   08/10/17 1815 08/10/17 1830 08/10/17 1845 08/10/17 1900  BP: 136/79 (!) 131/100 (!) 135/103 132/85  Pulse: (!) 47 92  94  Resp: (!) 29 17 16 20   Temp:      SpO2: 99% 100%  100%  Weight:      Height:          Constitutional: NAD, calm, comfortable Vitals:   08/10/17 1815 08/10/17 1830 08/10/17 1845 08/10/17 1900  BP: 136/79 (!) 131/100 (!) 135/103 132/85  Pulse: (!) 47 92  94  Resp: (!) 29 17 16 20   Temp:      SpO2: 99% 100%  100%  Weight:      Height:       Eyes: PERRL, lids and conjunctivae normal ENMT: Mucous membranes are moist. Posterior pharynx clear of any exudate or lesions.Normal dentition.  Neck: normal, supple, no masses, no thyromegaly Respiratory: clear to auscultation bilaterally, no wheezing, no crackles. Normal respiratory effort. No accessory muscle use.  Cardiovascular: Regular rate and rhythm, no murmurs / rubs / gallops. No extremity edema. 2+ pedal pulses. No carotid bruits.  Abdomen: no tenderness, no masses  palpated. No hepatosplenomegaly. Bowel sounds positive.  Musculoskeletal: no clubbing / cyanosis. No joint deformity upper and lower extremities. Good ROM, no contractures. Normal muscle tone.  Skin: no rashes, lesions, ulcers. No induration Neurologic: CN 2-12 grossly intact. Sensation intact, DTR normal. Strength 5/5 in all 4.  Psychiatric: Normal judgment and insight. Alert and oriented x 3. Normal mood.   Labs on Admission: I have personally reviewed following labs and imaging studies  CBC: Recent Labs  Lab 08/10/17 1610  WBC 7.2  HGB 15.5  HCT 46.8  MCV 98.7  PLT 413*   Basic Metabolic Panel: Recent Labs  Lab 08/10/17 1610  NA 136  K 4.5  CL 105  CO2 17*  GLUCOSE 96  BUN 35*  CREATININE 1.65*  CALCIUM 9.0   GFR: Estimated Creatinine Clearance: 32.7 mL/min (A) (by C-G formula based on SCr of 1.65 mg/dL (H)). Liver Function Tests: No results for input(s): AST, ALT, ALKPHOS, BILITOT, PROT, ALBUMIN in the last 168 hours. No results for input(s): LIPASE, AMYLASE in the last 168 hours. No results for input(s): AMMONIA in the last 168 hours. Coagulation Profile: No results for input(s): INR, PROTIME in the last 168 hours. Cardiac Enzymes: Recent Labs  Lab 08/10/17 1610  TROPONINI 0.04*   BNP (last 3 results) No results for input(s): PROBNP in the last 8760 hours. HbA1C: No results for input(s): HGBA1C in the last 72 hours. CBG: No results for input(s): GLUCAP in the last 168 hours. Lipid Profile: No results for input(s): CHOL, HDL, LDLCALC, TRIG, CHOLHDL, LDLDIRECT in the last 72 hours. Thyroid Function Tests: No results for input(s): TSH, T4TOTAL, FREET4, T3FREE, THYROIDAB in the last 72 hours. Anemia Panel: No results for input(s): VITAMINB12, FOLATE, FERRITIN, TIBC, IRON, RETICCTPCT in the last 72 hours. Urine analysis:    Component Value Date/Time   COLORURINE YELLOW 12/06/2016 1017   APPEARANCEUR CLEAR 12/06/2016 1017   LABSPEC 1.023 12/06/2016 1017    PHURINE 6.0 12/06/2016 1017   GLUCOSEU NEGATIVE 12/06/2016  [place] 12/06/2016 [place] 12/06/2016 [place] 12/06/2016 1017   PROTEINUR NEGATIVE 12/06/2016 1017   UROBILINOGEN 0.2 01/26/2011 1220   NITRITE NEGATIVE 12/06/2016 1017   LEUKOCYTESUR NEGATIVE 12/06/2016 1017   Sepsis Labs: @LABRCNTIP (procalcitonin:4,lacticidven:4) )No results found for this or any previous visit (from the past 240 hour(s)).   Radiological Exams on Admission: Dg Chest Port 1 View  Result Date: 08/10/2017 CLINICAL DATA:  Shortness of breath EXAM: PORTABLE CHEST 1 VIEW COMPARISON:  March 24, 2017 FINDINGS: The mediastinal contour is normal. Heart size is enlarged. The aorta is tortuous. There is patchy consolidation of left lung base. Bilateral pleural effusions are noted. There is mild interstitial edema. The visualized skeletal structures are unremarkable. IMPRESSION: Mild congestive heart failure. Patchy consolidation of left lung base, pneumonia is not excluded. Small bilateral pleural effusions. Electronically Signed   By: Abelardo Diesel M.D.   On: 08/10/2017 17:49    EKG: Independently reviewed.  Assessment/Plan Principal Problem:   Systolic CHF, acute on chronic (HCC) Active Problems:   Coronary artery disease   Mild cognitive impairment   Pharyngocutaneous fistula   Hallucination   CHF (congestive heart failure) (HCC)  Pneumonia  thrombocytopenia Mild acute kidney injury   #1 acute on chronic systolic dysfunction CHF: Patient has significant bilateral lower extremity edema. Minimal CHF. I'll place him on IV Lasix 40 mm twice a day. Additionally start him on low-dose ACE inhibitor and beta blockers. Start lisinopril 2.5 mg daily and Coreg 3.125 mg twice a day. Consult [facility] in the morning. Monitor input and output closely. Monitor renal functioning especially in the setting of diuresis.  #2 hallucinations: Patient has mild cognitive  impairment but this is new according to family. He is not hypoxic but has what appears to be possible pneumonia. Will check urinalysis to rule out UTI. If patient continues having neurologic symptoms we will check head CT without contrast or MRI to rule out acute CVA or TIA. He's also been taking Ambien at home which could be a cause.  #3 Pharyngocutaneous fistula: Patient will require frequent cleaning and suctioning. So far no evidence of complications  #4 coronary artery disease: Patient appears stable at this point.  #5 possible pneumonia: Empirically start him on Levaquin. No fever and no white count however. We may DC antibiotics if no significant findings A 4 hours especially fever or leukocytosis  #6 acute on chronic kidney disease: Patient is creatinine is currently 1.65. It was 1.1 back in November. We will watch kidney function closely especially with IV Lasix and lisinopril.  #7 mild thrombocytopenia: Patient on Lovenox for DVT prophylaxis. Watch patient's platelet counts.   DVT prophylaxis: Lovenox   Code Status: Full   Family Communication: Daughter who is primary caregiver. Worried mainly about patient's hallucinations. Desire full code   Disposition Plan: Home with possible home health  Consults called: None Admission status: Inpatient Telemetry Severity of Illness: The appropriate patient status for this patient is INPATIENT. Inpatient status is judged to be reasonable and necessary in order to provide the required intensity of service to ensure the patient's safety. The patient's presenting symptoms, physical exam findings, and initial radiographic and laboratory data in the context of their chronic comorbidities is felt to place them at high risk for further clinical deterioration. Furthermore, it is not anticipated that the patient will be medically stable for discharge from the hospital within 2 midnights of admission. The following factors support the patient status  of  inpatient.   " The patient's presenting symptoms include shortness of breath with lower extremity edema. " The worrisome physical exam findings include 3+ pedal edema with mild hallucination. " The initial radiographic and laboratory data are worrisome because of possible pneumonia as well as congestive heart failure on chest x-ray. " The chronic co-morbidities include history of pharyngeal cancer with tracheostomy.   * I certify that at the point of admission it is my clinical judgment that the patient will require inpatient hospital care spanning beyond 2 midnights from the point of admission due to high intensity of service, high risk for further deterioration and high frequency of surveillance required.Barbette Merino MD [facility] Pager [phone]  If 7PM-7AM, please contact night-coverage [email or web address] Password [email or web address]  08/10/2017, 7:10 PM

## 2017-08-11 DIAGNOSIS — I5022 Chronic systolic (congestive) heart failure: Secondary | ICD-10-CM

## 2017-08-11 LAB — URINALYSIS, ROUTINE W REFLEX MICROSCOPIC
Bilirubin Urine: NEGATIVE
GLUCOSE, UA: NEGATIVE mg/dL
HGB URINE DIPSTICK: NEGATIVE
Ketones, ur: NEGATIVE mg/dL
Leukocytes, UA: NEGATIVE
Nitrite: NEGATIVE
PH: 5 (ref 5.0–8.0)
PROTEIN: NEGATIVE mg/dL
Specific Gravity, Urine: 1.006 (ref 1.005–1.030)

## 2017-08-11 LAB — CBC WITH DIFFERENTIAL/PLATELET
Basophils Absolute: 0 10*3/uL (ref 0.0–0.1)
Basophils Relative: 0 %
EOS PCT: 2 %
Eosinophils Absolute: 0.1 10*3/uL (ref 0.0–0.7)
HCT: 47 % (ref 39.0–52.0)
Hemoglobin: 15.6 g/dL (ref 13.0–17.0)
LYMPHS ABS: 1.1 10*3/uL (ref 0.7–4.0)
LYMPHS PCT: 15 %
MCH: 32.4 pg (ref 26.0–34.0)
MCHC: 33.2 g/dL (ref 30.0–36.0)
MCV: 97.5 fL (ref 78.0–100.0)
MONO ABS: 0.5 10*3/uL (ref 0.1–1.0)
Monocytes Relative: 7 %
Neutro Abs: 5.6 10*3/uL (ref 1.7–7.7)
Neutrophils Relative %: 76 %
PLATELETS: 128 10*3/uL — AB (ref 150–400)
RBC: 4.82 MIL/uL (ref 4.22–5.81)
RDW: 15.4 % (ref 11.5–15.5)
WBC: 7.3 10*3/uL (ref 4.0–10.5)

## 2017-08-11 LAB — BASIC METABOLIC PANEL
Anion gap: 9 (ref 5–15)
BUN: 34 mg/dL — AB (ref 6–20)
CHLORIDE: 108 mmol/L (ref 101–111)
CO2: 22 mmol/L (ref 22–32)
Calcium: 9 mg/dL (ref 8.9–10.3)
Creatinine, Ser: 1.53 mg/dL — ABNORMAL HIGH (ref 0.61–1.24)
GFR calc Af Amer: 44 mL/min — ABNORMAL LOW (ref >60)
GFR calc non Af Amer: 38 mL/min — ABNORMAL LOW (ref >60)
GLUCOSE: 87 mg/dL (ref 65–99)
POTASSIUM: 3.8 mmol/L (ref 3.5–5.1)
Sodium: 139 mmol/L (ref 135–145)

## 2017-08-11 LAB — GLUCOSE, CAPILLARY: GLUCOSE-CAPILLARY: 124 mg/dL — AB (ref 65–99)

## 2017-08-11 LAB — HEMOGLOBIN A1C
Hgb A1c MFr Bld: 5.5 % (ref 4.8–5.6)
Mean Plasma Glucose: 111.15 mg/dL

## 2017-08-11 LAB — PROCALCITONIN: Procalcitonin: <0.1 ng/mL

## 2017-08-11 LAB — MRSA PCR SCREENING: MRSA BY PCR: NEGATIVE

## 2017-08-11 LAB — TSH: TSH: 7.512 u[IU]/mL — AB (ref 0.350–4.500)

## 2017-08-11 MED ORDER — TEMAZEPAM 15 MG PO CAPS
15.0000 mg | ORAL_CAPSULE | Freq: Every day | ORAL | Status: DC
  Administered 2017-08-11 – 2017-08-16 (×6): 15 mg via ORAL
  Filled 2017-08-11 (×6): qty 1

## 2017-08-11 MED ORDER — DEXTROSE 5 % IV SOLN
1.0000 g | INTRAVENOUS | Status: DC
  Administered 2017-08-11 – 2017-08-15 (×5): 1 g via INTRAVENOUS
  Filled 2017-08-11 (×5): qty 10

## 2017-08-11 MED ORDER — DOXYCYCLINE HYCLATE 100 MG IV SOLR
100.0000 mg | Freq: Two times a day (BID) | INTRAVENOUS | Status: DC
  Administered 2017-08-11 – 2017-08-15 (×9): 100 mg via INTRAVENOUS
  Filled 2017-08-11 (×10): qty 100

## 2017-08-11 NOTE — ED Notes (Signed)
Breakfast tray ordered 

## 2017-08-11 NOTE — ED Notes (Signed)
Gave pt Ambien to help his sleep, pt vomited small amount afterwards, no pill found in vomit

## 2017-08-11 NOTE — ED Notes (Signed)
Breakfast tray set up, pt assisted to eat

## 2017-08-11 NOTE — Care Management Note (Signed)
Case Management Note  Patient Details  Name: Timothy Suarez MRN: [id number] Date of Birth: 1925/07/11  Subjective/Objective:                  84 y.o.malewith medical history significant ofsystolic dysfunction, progressive lower extremity edema as well as hallucinations.  Presented with signs and symptoms of CHF exacerbation.  From [place].   Action/Plan: Admit status INPATIENT (CHF); anticipate discharge [facility].   Expected Discharge Date:  (unknown)               Expected Discharge Plan:  [place]  In-House Referral:  NA  Discharge planning Services  CM Consult  Status of Service:  In process, will continue to follow  If discussed at Long Length of Stay Meetings, dates discussed:    Additional Comments: Grayling Congress (daughter) [phone]  Fuller Mandril, RN 08/11/2017, 2:17 PM

## 2017-08-11 NOTE — ED Notes (Signed)
Admitting doctor at bedside 

## 2017-08-11 NOTE — ED Notes (Addendum)
Pt wrote on white board wants to get out. Called daughter about dad will page admitting for something to relax pt.

## 2017-08-11 NOTE — ED Notes (Signed)
Respiratory at bedside to suction patient

## 2017-08-11 NOTE — ED Notes (Signed)
Family at bedside. 

## 2017-08-11 NOTE — ED Notes (Addendum)
Pt daughter Grayling Congress [phone], daughter will return in the AM

## 2017-08-11 NOTE — Progress Notes (Signed)
[facility] PROGRESS NOTE  BLONG BUSK BHA:[id number] DOB: 1925/01/22 DOA: 08/10/2017   PCP: Gayland Curry, DO     Assessment/Plan: Principal Problem:   Systolic CHF, acute on chronic (HCC) Active Problems:   Coronary artery disease   Mild cognitive impairment   Pharyngocutaneous fistula   Hallucination   CHF (congestive heart failure) ([place])    84 y.o. male with medical history significant of systolic dysfunction CHF with last EF of 25% here with  Progressive lower extremity edema as well as hallucinations. He has been on Lasix 40 mg daily at home. Patient presented with signs and symptoms of CHF exacerbation.  Assessment and plan  #1 acute on chronic systolic dysfunction CHF: Patient has significant bilateral lower extremity edema. BNP markedly elevated greater than 2000. Chest x-ray consistent with small bilateral pleural effusions and congestive heart failure. Possibly could also have pneumonia in the left lung base. Will check pro-calcitonin.  Continue IV Lasix 40 mm twice a day. Additionally start him on low-dose ACE inhibitor and beta blockers. Most recent 2-D echo in May 2018 showed EF of 25-30%. Patient does have a history of low blood pressure in the past and hence was not previously on a beta blocker. Started on lisinopril 2.5 mg daily and Coreg 3.125 mg twice a day.  Primary cardiologist is Dr. Radford Pax. Monitor input and output closely. If His renal function worsens would discontinue ACE inhibitor  #2 hallucinations: Patient has mild cognitive impairment but this is new according to family.  Could be secondary to the above as well as possible pneumonia.  check urinalysis. Discontinued levofloxacin, avoid fluoroquinolones in the elderly. Also discontinued Ambien. If patient continues having neurologic symptoms we will check head CT without contrast or MRI to rule out acute CVA or TIA.  For sleep will use low-dose Restoril as needed  #3 Pharyngocutaneous  fistula: Patient will require frequent cleaning and suctioning. So far no evidence of complications  #4 coronary artery disease: Patient appears stable at this point.  #5 possible pneumonia: Empirically start him on Levaquin yesterday which has been discontinued. Will switch to Rocephin/doxycycline and check a pro-calcitonin level to see if antibiotics are needed.. No fever and no white count however. Avoid azithromycin due to QTC prolongation. A 4 hours especially fever or leukocytosis  #6 acute on chronic kidney disease: Patient is creatinine is currently 1.65. It was 1.1 back in November. We will watch kidney function closely especially with IV Lasix and lisinopril.  #7 mild thrombocytopenia: Patient on Lovenox for DVT prophylaxis. Watch patient's platelet counts.       DVT prophylaxsis Lovenox  Code Status: Full code      Family Communication: Discussed in detail with the patient/ daughter , all imaging results, lab results explained to the patient   Disposition Plan: 1-2 days      Consultants:  None  Procedures:  None  Antibiotics: Anti-infectives (From admission, onward)   Start     Dose/Rate Route Frequency Ordered Stop   08/10/17 2000  levofloxacin (LEVAQUIN) IVPB 500 mg  Status:  Discontinued     500 mg 100 mL/hr over 60 Minutes Intravenous Every 24 hours 08/10/17 1927 08/11/17 0803         HPI/Subjective: Patient's daughter requesting Ambien at night as the patient has been on it for 10 years. He was agitated last night. His shortness of breath has improved. He has been hallucinating. Dependent edema is also improved  Objective: Vitals:  08/10/17 2315 08/11/17 0000 08/11/17 0030 08/11/17 0600  BP: 124/71 112/78 128/69 139/73  Pulse: 84 (!) 53 83 76  Resp: 17 (!) 24 (!) 26 19  Temp:      SpO2: 100% 100% 99% 100%  Weight:      Height:        Intake/Output Summary (Last 24 hours) at 08/11/2017 0805 Last data filed at 08/11/2017 0748 Gross per 24  hour  Intake 278 ml  Output 1325 ml  Net -1047 ml    Exam:  Examination:  General exam: Appears calm and comfortable  Respiratory system: Clear to auscultation. Respiratory effort normal. Cardiovascular system: S1 & S2 heard, RRR. No JVD, murmurs, rubs, gallops or clicks. No pedal edema. Gastrointestinal system: Abdomen is nondistended, soft and nontender. No organomegaly or masses felt. Normal bowel sounds heard. Central nervous system: Alert and oriented. No focal neurological deficits. Extremities: Symmetric 5 x 5 power. 2+ pitting edema Skin: No rashes, lesions or ulcers Psychiatry: Impaired Judgement and insight    Data Reviewed: I have personally reviewed following labs and imaging studies  Micro Results No results found for this or any previous visit (from the past 240 hour(s)).  Radiology Reports Dg Chest Port 1 View  Result Date: 08/10/2017 CLINICAL DATA:  Shortness of breath EXAM: PORTABLE CHEST 1 VIEW COMPARISON:  March 24, 2017 FINDINGS: The mediastinal contour is normal. Heart size is enlarged. The aorta is tortuous. There is patchy consolidation of left lung base. Bilateral pleural effusions are noted. There is mild interstitial edema. The visualized skeletal structures are unremarkable. IMPRESSION: Mild congestive heart failure. Patchy consolidation of left lung base, pneumonia is not excluded. Small bilateral pleural effusions. Electronically Signed   By: Abelardo Diesel M.D.   On: 08/10/2017 17:49     CBC Recent Labs  Lab 08/10/17 1610 08/10/17 2037 08/11/17 0543  WBC 7.2 7.0 7.3  HGB 15.5 15.3 15.6  HCT 46.8 45.1 47.0  PLT 137* 129* 128*  MCV 98.7 97.8 97.5  MCH 32.7 33.2 32.4  MCHC 33.1 33.9 33.2  RDW 15.5 15.4 15.4  LYMPHSABS  --   --  1.1  MONOABS  --   --  0.5  EOSABS  --   --  0.1  BASOSABS  --   --  0.0    Chemistries  Recent Labs  Lab 08/10/17 1610 08/10/17 2037 08/11/17 0543  NA 136  --  139  K 4.5  --  3.8  CL 105  --  108  CO2 17*   --  22  GLUCOSE 96  --  87  BUN 35*  --  34*  CREATININE 1.65* 1.53* 1.53*  CALCIUM 9.0  --  9.0  AST  --  23  --   ALT  --  15*  --   ALKPHOS  --  93  --   BILITOT  --  1.4*  --    ------------------------------------------------------------------------------------------------------------------ estimated creatinine clearance is 35.2 mL/min (A) (by C-G formula based on SCr of 1.53 mg/dL (H)). ------------------------------------------------------------------------------------------------------------------ No results for input(s): HGBA1C in the last 72 hours. ------------------------------------------------------------------------------------------------------------------ No results for input(s): CHOL, HDL, LDLCALC, TRIG, CHOLHDL, LDLDIRECT in the last 72 hours. ------------------------------------------------------------------------------------------------------------------ No results for input(s): TSH, T4TOTAL, T3FREE, THYROIDAB in the last 72 hours.  Invalid input(s): FREET3 ------------------------------------------------------------------------------------------------------------------ No results for input(s): VITAMINB12, FOLATE, FERRITIN, TIBC, IRON, RETICCTPCT in the last 72 hours.  Coagulation profile No results for input(s): INR, PROTIME in the last 168 hours.  No results for input(s):  DDIMER in the last 72 hours.  Cardiac Enzymes Recent Labs  Lab 08/10/17 1610  TROPONINI 0.04*   ------------------------------------------------------------------------------------------------------------------ Invalid input(s): POCBNP   CBG: No results for input(s): GLUCAP in the last 168 hours.     Studies: Dg Chest Port 1 View  Result Date: 08/10/2017 CLINICAL DATA:  Shortness of breath EXAM: PORTABLE CHEST 1 VIEW COMPARISON:  March 24, 2017 FINDINGS: The mediastinal contour is normal. Heart size is enlarged. The aorta is tortuous. There is patchy consolidation of left lung  base. Bilateral pleural effusions are noted. There is mild interstitial edema. The visualized skeletal structures are unremarkable. IMPRESSION: Mild congestive heart failure. Patchy consolidation of left lung base, pneumonia is not excluded. Small bilateral pleural effusions. Electronically Signed   By: Abelardo Diesel M.D.   On: 08/10/2017 17:49      Lab Results  Component Value Date   HGBA1C  2020/08/2208    5.3 (NOTE) The ADA recommends the following therapeutic goal for glycemic control related to Hgb A1c measurement: Goal of therapy: <6.5 Hgb A1c  Reference: American Diabetes Association: Clinical Practice Recommendations 2010, Diabetes Care, 2010, 33: (Suppl  1).   Lab Results  Component Value Date   LDLCALC 77 12/21/2016   CREATININE 1.53 (H) 08/11/2017       Scheduled Meds: . aspirin EC  81 mg Oral Daily  . atorvastatin  40 mg Oral Daily  . carvedilol  3.125 mg Oral BID WC  . enoxaparin (LOVENOX) injection  40 mg Subcutaneous Q24H  . furosemide  40 mg Intravenous Q12H  . lisinopril  2.5 mg Oral Daily  . sodium chloride flush  3 mL Intravenous Q12H   Continuous Infusions: . sodium chloride       LOS: 1 day    Time spent: >30 MINS    Reyne Dumas  [facility] Pager [phone]. If 7PM-7AM, please contact night-coverage at [email or web address], password [facility] 08/11/2017, 8:05 AM  LOS: 1 day

## 2017-08-11 NOTE — ED Notes (Signed)
Pt having visual hallucinations, states that his sandwich had ants on it

## 2017-08-11 NOTE — ED Notes (Signed)
Timothy Suarez, RRT made aware of need for trach suctioning

## 2017-08-12 ENCOUNTER — Inpatient Hospital Stay (HOSPITAL_COMMUNITY): Payer: Medicare Other

## 2017-08-12 ENCOUNTER — Encounter (HOSPITAL_COMMUNITY): Payer: Self-pay | Admitting: Radiology

## 2017-08-12 DIAGNOSIS — I2510 Atherosclerotic heart disease of native coronary artery without angina pectoris: Secondary | ICD-10-CM

## 2017-08-12 LAB — BASIC METABOLIC PANEL
Anion gap: 9 (ref 5–15)
BUN: 32 mg/dL — AB (ref 6–20)
CHLORIDE: 111 mmol/L (ref 101–111)
CO2: 21 mmol/L — AB (ref 22–32)
Calcium: 8.4 mg/dL — ABNORMAL LOW (ref 8.9–10.3)
Creatinine, Ser: 1.4 mg/dL — ABNORMAL HIGH (ref 0.61–1.24)
GFR calc Af Amer: 49 mL/min — ABNORMAL LOW (ref >60)
GFR calc non Af Amer: 43 mL/min — ABNORMAL LOW (ref >60)
Glucose, Bld: 90 mg/dL (ref 65–99)
POTASSIUM: 3.1 mmol/L — AB (ref 3.5–5.1)
SODIUM: 141 mmol/L (ref 135–145)

## 2017-08-12 MED ORDER — LORAZEPAM 2 MG/ML IJ SOLN
1.0000 mg | Freq: Once | INTRAMUSCULAR | Status: AC
  Administered 2017-08-12: 1 mg via INTRAVENOUS
  Filled 2017-08-12: qty 1

## 2017-08-12 MED ORDER — POTASSIUM CHLORIDE CRYS ER 20 MEQ PO TBCR
40.0000 meq | EXTENDED_RELEASE_TABLET | Freq: Two times a day (BID) | ORAL | Status: AC
  Administered 2017-08-12 – 2017-08-13 (×2): 40 meq via ORAL
  Filled 2017-08-12 (×2): qty 2

## 2017-08-12 NOTE — Progress Notes (Signed)
[facility] PROGRESS NOTE  TONYA CARLILE STM:[id number] DOB: September 26, 1924 DOA: 08/10/2017   PCP: Gayland Curry, DO     Assessment/Plan: Principal Problem:   Systolic CHF, acute on chronic (HCC) Active Problems:   Coronary artery disease   Mild cognitive impairment   Pharyngocutaneous fistula   Hallucination   CHF (congestive heart failure) ([place])    84 y.o. male with medical history significant of systolic dysfunction CHF with last EF of 25% here with  Progressive lower extremity edema as well as hallucinations. He has been on Lasix 40 mg daily at home. Patient presented with signs and symptoms of CHF exacerbation.  Assessment and plan  #1 acute on chronic systolic dysfunction CHF: Patient has significant bilateral lower extremity edema. BNP markedly elevated greater than 2000. Chest x-ray consistent with small bilateral pleural effusions and congestive heart failure. Possibly could also have pneumonia in the left lung base.  procalcitonin normal.  Continue IV Lasix 40 mm twice a day. On low-dose ACE inhibitor and beta blockers. Most recent 2-D echo in May 2018 showed EF of 25-30%.  Ordered 2D echocardiogram to reevaluate heart function.  Patient does have a history of low blood pressure in the past and hence was not previously on a beta blocker. On lisinopril 2.5 mg daily and Coreg 3.125 mg twice a day.  Primary cardiologist is Dr. Radford Pax. Monitor input and output closely. If His renal function worsens would discontinue ACE inhibitor  #2 hallucinations: Patient has mild cognitive impairment but this is new according to family.  Could be secondary to the above as well as possible pneumonia.  UA unremarkable. Discontinued levofloxacin, avoid fluoroquinolones in the elderly. Also discontinued Ambien.  Patient continues to be confused to, having hallucinations and agitated.  Ordered MRI of the brain without contrast to evaluate.  For sleep will use low-dose Restoril as  needed  #3 Pharyngocutaneous fistula: Patient will require frequent cleaning and suctioning. So far no evidence of complications  #4 coronary artery disease: Patient appears stable at this point.  #5 possible pneumonia: Empirically was started on Levaquin which has been discontinued.  Switch to Rocephin/doxycycline now.  Pro-calcitonin less than 0.10.  No fever and no white count however. Avoid azithromycin due to QTC prolongation. If he remains afebrile without any leukocytosis then will consider discontinuing the antibiotics tomorrow.  #6 acute on chronic kidney disease: Patient is creatinine is currently 1.65. It was 1.1 back in November. We will watch kidney function closely especially with IV Lasix and lisinopril.  #7 mild thrombocytopenia: Patient on Lovenox for DVT prophylaxis.  His platelet count decreased to 128 today.  We will discontinue Lovenox and place him on lower extremity series.  Continue to monitor platelet count.  DVT prophylaxsis Lovenox; will switch to SCD's due to thrombocytopenia.  Code Status: Full code      Family Communication: Was discussed in detail with the patient/ daughter by previous hospitalist yesterday.  Family not at the bedside at this time.  We will try to communicate with the family once the echo and the MRI results available.  Disposition Plan: 1-2 days  Consultants:  None  Procedures:  None  Antibiotics: Anti-infectives (From admission, onward)   Start     Dose/Rate Route Frequency Ordered Stop   08/11/17 0900  doxycycline (VIBRAMYCIN) 100 mg in dextrose 5 % 250 mL IVPB     100 mg 125 mL/hr over 120 Minutes Intravenous Every 12 hours 08/11/17 0813  08/11/17 0845  cefTRIAXone (ROCEPHIN) 1 g in dextrose 5 % 50 mL IVPB     1 g 100 mL/hr over 30 Minutes Intravenous Every 24 hours 08/11/17 0813     08/10/17 2000  levofloxacin (LEVAQUIN) IVPB 500 mg  Status:  Discontinued     500 mg 100 mL/hr over 60 Minutes Intravenous Every 24  hours 08/10/17 1927 08/11/17 0803      HPI/Subjective: Per nursing staff has been agitated. He has been hallucinating. Has sitter at bedside. Dependent edema is also improved  Objective: Vitals:   08/11/17 1922 08/11/17 2010 08/12/17 0337 08/12/17 1151  BP: 123/85  105/62 (!) 129/53  Pulse: 78 90 92 88  Resp: 18 (!) 26 18 18   Temp: (!) 97.3 F (36.3 C)  98 F (36.7 C)   TempSrc: Oral  Axillary   SpO2: 96% 98% 98% 98%  Weight:   77.4 kg (170 lb 10.2 oz)   Height:        Intake/Output Summary (Last 24 hours) at 08/12/2017 1508 Last data filed at 08/12/2017 0056 Gross per 24 hour  Intake 370 ml  Output 200 ml  Net 170 ml    Exam:  Examination:  General exam: Currently calm and comfortable after medication Respiratory system: Clear to auscultation. Respiratory effort normal. Cardiovascular system: S1 & S2 heard, RRR. No JVD, murmurs, rubs, gallops or clicks. No pedal edema. Gastrointestinal system: Abdomen is nondistended, soft and nontender. No organomegaly or masses felt. Normal bowel sounds heard. Central nervous system: Alert and oriented. No focal neurological deficits. Extremities: Symmetric 5 x 5 power. 2+ pitting edema Skin: No rashes, lesions or ulcers Psychiatry: Impaired Judgement and insight    Data Reviewed: I have personally reviewed following labs and imaging studies  Micro Results Recent Results (from the past 240 hour(s))  MRSA PCR Screening     Status: None   Collection Time: 08/11/17  6:14 PM  Result Value Ref Range Status   MRSA by PCR NEGATIVE NEGATIVE Final    Comment:        The GeneXpert MRSA Assay (FDA approved for NASAL specimens only), is one component of a comprehensive MRSA colonization surveillance program. It is not intended to diagnose MRSA infection nor to guide or monitor treatment for MRSA infections.     Radiology Reports Dg Chest Port 1 View  Result Date: 08/10/2017 CLINICAL DATA:  Shortness of breath EXAM: PORTABLE CHEST  1 VIEW COMPARISON:  March 24, 2017 FINDINGS: The mediastinal contour is normal. Heart size is enlarged. The aorta is tortuous. There is patchy consolidation of left lung base. Bilateral pleural effusions are noted. There is mild interstitial edema. The visualized skeletal structures are unremarkable. IMPRESSION: Mild congestive heart failure. Patchy consolidation of left lung base, pneumonia is not excluded. Small bilateral pleural effusions. Electronically Signed   By: Abelardo Diesel M.D.   On: 08/10/2017 17:49     CBC Recent Labs  Lab 08/10/17 1610 08/10/17 2037 08/11/17 0543  WBC 7.2 7.0 7.3  HGB 15.5 15.3 15.6  HCT 46.8 45.1 47.0  PLT 137* 129* 128*  MCV 98.7 97.8 97.5  MCH 32.7 33.2 32.4  MCHC 33.1 33.9 33.2  RDW 15.5 15.4 15.4  LYMPHSABS  --   --  1.1  MONOABS  --   --  0.5  EOSABS  --   --  0.1  BASOSABS  --   --  0.0    Chemistries  Recent Labs  Lab 08/10/17 1610 08/10/17 2037 08/11/17 0543  08/12/17 0428  NA 136  --  139 141  K 4.5  --  3.8 3.1*  CL 105  --  108 111  CO2 17*  --  22 21*  GLUCOSE 96  --  87 90  BUN 35*  --  34* 32*  CREATININE 1.65* 1.53* 1.53* 1.40*  CALCIUM 9.0  --  9.0 8.4*  AST  --  23  --   --   ALT  --  15*  --   --   ALKPHOS  --  93  --   --   BILITOT  --  1.4*  --   --    ------------------------------------------------------------------------------------------------------------------ estimated creatinine clearance is 37.4 mL/min (A) (by C-G formula based on SCr of 1.4 mg/dL (H)). ------------------------------------------------------------------------------------------------------------------ Recent Labs    08/11/17 0543  HGBA1C 5.5   ------------------------------------------------------------------------------------------------------------------ No results for input(s): CHOL, HDL, LDLCALC, TRIG, CHOLHDL, LDLDIRECT in the last 72  hours. ------------------------------------------------------------------------------------------------------------------ Recent Labs    08/11/17 1819  TSH 7.512*   ------------------------------------------------------------------------------------------------------------------ No results for input(s): VITAMINB12, FOLATE, FERRITIN, TIBC, IRON, RETICCTPCT in the last 72 hours.  Coagulation profile No results for input(s): INR, PROTIME in the last 168 hours.  No results for input(s): DDIMER in the last 72 hours.  Cardiac Enzymes Recent Labs  Lab 08/10/17 1610  TROPONINI 0.04*   ------------------------------------------------------------------------------------------------------------------ Invalid input(s): POCBNP   CBG: Recent Labs  Lab 08/11/17 2121  GLUCAP 124*       Studies: Dg Chest Port 1 View  Result Date: 08/10/2017 CLINICAL DATA:  Shortness of breath EXAM: PORTABLE CHEST 1 VIEW COMPARISON:  March 24, 2017 FINDINGS: The mediastinal contour is normal. Heart size is enlarged. The aorta is tortuous. There is patchy consolidation of left lung base. Bilateral pleural effusions are noted. There is mild interstitial edema. The visualized skeletal structures are unremarkable. IMPRESSION: Mild congestive heart failure. Patchy consolidation of left lung base, pneumonia is not excluded. Small bilateral pleural effusions. Electronically Signed   By: Abelardo Diesel M.D.   On: 08/10/2017 17:49      Lab Results  Component Value Date   HGBA1C 5.5 08/11/2017   HGBA1C  2020-11-208    5.3 (NOTE) The ADA recommends the following therapeutic goal for glycemic control related to Hgb A1c measurement: Goal of therapy: <6.5 Hgb A1c  Reference: American Diabetes Association: Clinical Practice Recommendations 2010, Diabetes Care, 2010, 33: (Suppl  1).   Lab Results  Component Value Date   LDLCALC 77 12/21/2016   CREATININE 1.40 (H) 08/12/2017       Scheduled Meds: . aspirin EC   81 mg Oral Daily  . atorvastatin  40 mg Oral Daily  . carvedilol  3.125 mg Oral BID WC  . enoxaparin (LOVENOX) injection  40 mg Subcutaneous Q24H  . furosemide  40 mg Intravenous Q12H  . lisinopril  2.5 mg Oral Daily  . sodium chloride flush  3 mL Intravenous Q12H  . temazepam  15 mg Oral QHS   Continuous Infusions: . sodium chloride    . cefTRIAXone (ROCEPHIN)  IV Stopped (08/12/17 1456)  . doxycycline (VIBRAMYCIN) IV 100 mg (08/12/17 1329)     LOS: 2 days    Time spent: >30 MINS    Yaakov Guthrie, MD  [facility] Pager [phone]. If 7PM-7AM, please contact night-coverage at [email or web address], password [facility] 08/12/2017, 3:08 PM  LOS: 2 days
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
## 2017-08-12 NOTE — Plan of Care (Signed)
  Health Behavior/Discharge Planning: Ability to manage health-related needs will improve 08/12/2017 0802 - Not Progressing by Imagene Gurney, RN

## 2017-08-12 NOTE — Progress Notes (Addendum)
PT Cancellation Note  Patient Details Name: Timothy Suarez MRN: [id number] DOB: [birth year]   Cancelled Treatment:    Reason Eval/Treat Not Completed: Patient not medically ready. Per RN, pt became very confused and combative; given ativan. Requesting PT hold off for evaluation. Will follow-up as time allows.  Mabeline Caras, PT, DPT Acute Rehab Services  Pager: [place] 08/12/2017, 8:01 AM

## 2017-08-12 NOTE — Progress Notes (Signed)
OT Cancellation Note  Patient Details Name: QUINTEL MCCALLA MRN: [id number] DOB: 1925-03-22   Cancelled Treatment:    Reason Eval/Treat Not Completed: Medical issues which prohibited therapy. Per RN, pt became very confused and combative; given ativan. Requesting therapies hold off for evaluation. Will follow-up as time allows/as appropriate    Britt Bottom 08/12/2017, 9:39 AM

## 2017-08-13 ENCOUNTER — Inpatient Hospital Stay (HOSPITAL_COMMUNITY): Payer: Medicare Other

## 2017-08-13 DIAGNOSIS — I351 Nonrheumatic aortic (valve) insufficiency: Secondary | ICD-10-CM

## 2017-08-13 DIAGNOSIS — I361 Nonrheumatic tricuspid (valve) insufficiency: Secondary | ICD-10-CM

## 2017-08-13 LAB — CBC
HCT: 48.8 % (ref 39.0–52.0)
HEMOGLOBIN: 15.8 g/dL (ref 13.0–17.0)
MCH: 31.7 pg (ref 26.0–34.0)
MCHC: 32.4 g/dL (ref 30.0–36.0)
MCV: 97.8 fL (ref 78.0–100.0)
Platelets: 135 10*3/uL — ABNORMAL LOW (ref 150–400)
RBC: 4.99 MIL/uL (ref 4.22–5.81)
RDW: 15 % (ref 11.5–15.5)
WBC: 8 10*3/uL (ref 4.0–10.5)

## 2017-08-13 LAB — BASIC METABOLIC PANEL
ANION GAP: 10 (ref 5–15)
BUN: 28 mg/dL — ABNORMAL HIGH (ref 6–20)
CALCIUM: 8.5 mg/dL — AB (ref 8.9–10.3)
CO2: 27 mmol/L (ref 22–32)
Chloride: 103 mmol/L (ref 101–111)
Creatinine, Ser: 1.38 mg/dL — ABNORMAL HIGH (ref 0.61–1.24)
GFR, EST AFRICAN AMERICAN: 50 mL/min — AB (ref >60)
GFR, EST NON AFRICAN AMERICAN: 43 mL/min — AB (ref >60)
Glucose, Bld: 92 mg/dL (ref 65–99)
Potassium: 3 mmol/L — ABNORMAL LOW (ref 3.5–5.1)
SODIUM: 140 mmol/L (ref 135–145)

## 2017-08-13 LAB — ECHOCARDIOGRAM COMPLETE
HEIGHTINCHES: 71 in
Weight: 2765.45 oz

## 2017-08-13 MED ORDER — TRAZODONE HCL 50 MG PO TABS
50.0000 mg | ORAL_TABLET | Freq: Every evening | ORAL | Status: DC | PRN
  Filled 2017-08-13: qty 1

## 2017-08-13 MED ORDER — POTASSIUM CHLORIDE CRYS ER 20 MEQ PO TBCR
40.0000 meq | EXTENDED_RELEASE_TABLET | Freq: Two times a day (BID) | ORAL | Status: AC
  Administered 2017-08-13 (×2): 40 meq via ORAL
  Filled 2017-08-13 (×2): qty 2

## 2017-08-13 NOTE — Evaluation (Signed)
Occupational Therapy Evaluation Patient Details Name: ANZEL KEARSE MRN: [id number] DOB: 1925-06-14 Today's Date: 08/13/2017    History of Present Illness 84yo male with B LE edema, hallucinations, orthopnea, PND with old trach site which per MD note patient had been independent in self-suctioning at home. Dx with acute on chronic CHF, hallucinations. PMH A-fib, aortic valve insufficiency, cerbral ischemia, hx colon CA, CAD, HTN, PAF, thoracic AAA, hx cardiac cath, hx joint replacements, vocal cord CA, hx multiple tracheoesophageal surgeries    Clinical Impression   Pt admitted with the above diagnoses and presents with below problem list. Pt will benefit from continued acute OT to address the below listed deficits and maximize independence with basic ADLs prior to d/c to next venue. Unclear what PLOF with basic ADLs was or pt's baseline cognition. No family present during eval and pt is nonverbal. Pt currently +2 min-mod A for LB ADLs and functional mobility/transfers. No aggressive behaviors observed during OT session, pt does have some decreased safety awareness.      Follow Up Recommendations  SNF;Supervision/Assistance - 24 hour    Equipment Recommendations  Other (comment)(defer to next venue, may need 3n1)    Recommendations for Other Services       Precautions / Restrictions Precautions Precautions: Fall;Other (comment) Precaution Comments: intermittent agitation/combativeness, non-verbal  Restrictions Weight Bearing Restrictions: No      Mobility Bed Mobility Overal bed mobility: Needs Assistance Bed Mobility: Supine to Sit;Sit to Supine     Supine to sit: Min guard(visual cues for encouragement ) Sit to supine: Min assist   General bed mobility comments: Min assist with return to bed for LE management   Transfers Overall transfer level: Needs assistance Equipment used: Rolling walker (2 wheeled) Transfers: Sit to/from Stand Sit to Stand: Mod assist;Min  assist         General transfer comment: Mod assist x2 for transfer from low surface, Min assist x2 for transfer from elevated surface     Balance Overall balance assessment: Needs assistance Sitting-balance support: Bilateral upper extremity supported;Feet supported Sitting balance-Leahy Scale: Good     Standing balance support: Bilateral upper extremity supported;During functional activity Standing balance-Leahy Scale: Fair                             ADL either performed or assessed with clinical judgement   ADL Overall ADL's : Needs assistance/impaired Eating/Feeding: Set up;Sitting;Supervision/ safety   Grooming: Moderate assistance;Sitting Grooming Details (indicate cue type and reason): wiped face when handed washcloth. likely assist for longer grooming session Upper Body Bathing: Moderate assistance;Sitting   Lower Body Bathing: Moderate assistance;+2 for physical assistance;Sit to/from stand   Upper Body Dressing : Moderate assistance;Sitting   Lower Body Dressing: Moderate assistance;+2 for physical assistance;Sit to/from stand   Toilet Transfer: Minimal assistance;+2 for physical assistance;Moderate assistance;Ambulation;RW;Grab bars(3n1 over toilet) Toilet Transfer Details (indicate cue type and reason): mostly min +2 assist, occasional mod on turns and for sit/stand. Toileting- Clothing Manipulation and Hygiene: Moderate assistance;+2 for physical assistance;Sit to/from stand   Tub/ Banker: Walk-in shower;Moderate assistance;+2 for physical assistance;3 in 1;Rolling walker Tub/Shower Transfer Details (indicate cue type and reason): clinical judgement Functional mobility during ADLs: Minimal assistance;+2 for physical assistance;Rolling walker General ADL Comments: Pt completed bed mobility, ambulated to/from bathroom, and completed toilet transfer. Brief standing rest breaks incorporated. Increased time and effort and some assist to navigate rw  through obstacles in room.      Vision  Perception     Praxis      Pertinent Vitals/Pain Pain Assessment: Faces Faces Pain Scale: No hurt Pain Intervention(s): Monitored during session;Limited activity within patient's tolerance     Hand Dominance Right   Extremity/Trunk Assessment Upper Extremity Assessment Upper Extremity Assessment: Generalized weakness   Lower Extremity Assessment Lower Extremity Assessment: Defer to PT evaluation   Cervical / Trunk Assessment Cervical / Trunk Assessment: Kyphotic   Communication Communication Communication: HOH;Other (comment);Tracheostomy(non-verbal; has old trach site )   Cognition Arousal/Alertness: Awake/alert Behavior During Therapy: WFL for tasks assessed/performed Overall Cognitive Status: No family/caregiver present to determine baseline cognitive functioning                                 General Comments: patient with history of agitation/combativness with nursing, mild cognitive impairemnt per chart reveiw of PMH. Some decreased safety awareness observed.    General Comments  Pt giving 'thumbs up' and smiling at times including end of session.     Exercises     Shoulder Instructions      Home Living Family/patient expects to be discharged to:: Unsure Living Arrangements: Alone                               Additional Comments: pt resides at [facility] independent living . Pt nodding that he has assist for cooking and cleaning      Prior Functioning/Environment Level of Independence: Independent with assistive device(s)        Comments: unsure regarding current level of independence, prior notes in chart indicate I with RW        OT Problem List: Decreased strength;Decreased activity tolerance;Impaired balance (sitting and/or standing);Decreased cognition;Decreased safety awareness;Decreased knowledge of use of DME or AE;Decreased knowledge of  precautions;Cardiopulmonary status limiting activity      OT Treatment/Interventions: Self-care/ADL training;Energy conservation;Therapeutic exercise;DME and/or AE instruction;Therapeutic activities;Cognitive remediation/compensation;Patient/family education;Balance training    OT Goals(Current goals can be found in the care plan section) Acute Rehab OT Goals OT Goal Formulation: With patient Time For Goal Achievement: 08/27/17 Potential to Achieve Goals: Good ADL Goals Pt Will Perform Grooming: with set-up;sitting;standing Pt Will Perform Upper Body Bathing: with set-up;sitting Pt Will Perform Lower Body Bathing: with min guard assist;sit to/from stand Pt Will Perform Upper Body Dressing: with set-up;with supervision;sitting Pt Will Perform Lower Body Dressing: with min guard assist;sit to/from stand Pt Will Transfer to Toilet: with min assist;ambulating Pt Will Perform Toileting - Clothing Manipulation and hygiene: with min guard assist;sit to/from stand Additional ADL Goal #1: Pt will complete bed mobility at supervision level to prepare for OOB ADLs.  OT Frequency: Min 2X/week   Barriers to D/C:            Co-evaluation PT/OT/SLP Co-Evaluation/Treatment: Yes Reason for Co-Treatment: For patient/therapist safety PT goals addressed during session: Mobility/safety with mobility;Proper use of DME OT goals addressed during session: ADL's and self-care      AM-PAC PT "6 Clicks" Daily Activity     Outcome Measure Help from another person eating meals?: A Little Help from another person taking care of personal grooming?: A Little Help from another person toileting, which includes using toliet, bedpan, or urinal?: A Lot Help from another person bathing (including washing, rinsing, drying)?: A Lot Help from another person to put on and taking off regular upper body clothing?: A Lot Help from another person to  put on and taking off regular lower body clothing?: A Lot 6 Click Score:  14   End of Session Equipment Utilized During Treatment: Gait belt;Rolling walker  Activity Tolerance: Patient tolerated treatment well;Patient limited by fatigue Patient left: in chair;with call bell/phone within reach;with chair alarm set;with nursing/sitter in room  OT Visit Diagnosis: Unsteadiness on feet (R26.81);Muscle weakness (generalized) (M62.81);Other symptoms and signs involving cognitive function                Time: 0950-1020 OT Time Calculation (min): 30 min Charges:  OT General Charges $OT Visit: 1 Visit OT Evaluation $OT Eval Low Complexity: 1 Low G-Codes:       Hortencia Pilar 08/13/2017, 1:10 PM

## 2017-08-13 NOTE — Plan of Care (Signed)
  Education: Knowledge of General Education information will improve 08/13/2017 1033 - Not Progressing by Imagene Gurney, RN

## 2017-08-13 NOTE — Progress Notes (Signed)
[facility] PROGRESS NOTE  Timothy Suarez OJJ:[id number] DOB: 1925/02/03 DOA: 08/10/2017   PCP: Gayland Curry, DO     Assessment/Plan: Principal Problem:   Systolic CHF, acute on chronic (HCC) Active Problems:   Coronary artery disease   Mild cognitive impairment   Pharyngocutaneous fistula   Hallucination   CHF (congestive heart failure) ([place])    84 y.o. male with medical history significant of systolic dysfunction CHF with last EF of 25% here with  Progressive lower extremity edema as well as hallucinations. He has been on Lasix 40 mg daily at home. Patient presented with signs and symptoms of CHF exacerbation.  Assessment and plan  #1 acute on chronic systolic dysfunction CHF: Patient had significant bilateral lower extremity edema. BNP markedly elevated greater than 2000. Chest x-ray consistent with small bilateral pleural effusions and congestive heart failure. Possibly could also have pneumonia in the left lung base.  procalcitonin normal.  Continue IV Lasix 40 mm twice a day. On low-dose ACE inhibitor and beta blockers. Most recent 2-D echo in May 2018 showed EF of 25-30%.  Ordered 2D echocardiogram to reevaluate heart function.  Patient does have a history of low blood pressure in the past and hence was not previously on a beta blocker. On lisinopril 2.5 mg daily and Coreg 3.125 mg twice a day.  Primary cardiologist is Dr. Radford Pax. Monitor input and output closely. If His renal function worsens would discontinue ACE inhibitor  #2 hallucinations: Patient has mild cognitive impairment but this is new according to family.  Could be secondary to the above as well as possible pneumonia.  UA unremarkable. Discontinued levofloxacin, avoid fluoroquinolones in the elderly. Also discontinued Ambien.  Agitated at times but appears calm today.  MRI of the brain did not show any acute/early subacute stroke or mass.  Severe motion artifact. If his hallucinations do not to persist  will consider consulting psychiatry.  Improving per the daughter.   #3 Pharyngocutaneous fistula: Patient will require frequent cleaning and suctioning. So far no evidence of complications  #4 coronary artery disease: Patient appears stable at this point.  #5 possible pneumonia: Empirically was started on Levaquin which has been discontinued.  Switched to Rocephin/doxycycline now.  Pro-calcitonin less than 0.10.  No fever and no white count however. Avoid azithromycin due to QTC prolongation.  Since he is improving we will continue antibiotics for now.  #6 acute on chronic kidney disease:  Creatinine at the time of admission was 1.65. It was 1.1 back in November. We will watch kidney function closely especially with IV Lasix and lisinopril.  Today 1.38.  #7 mild thrombocytopenia: Patient on Lovenox for DVT prophylaxis.  His platelet count decreased to 128 yesterday.  Discontinued Lovenox and placed him on lower extremity SCDs.  Today platelet count 135.  Continue to monitor platelet count.  Nonspecific rash right upper thigh: Exact etiology unclear at this time.  No bedbugs per the daughter.  Monitor for now.  Debility: Physical therapy consulted.  DVT prophylaxsis switched to SCD's due to thrombocytopenia.  Code Status: Full code      Family Communication: Plan of care discussed with the daughter at length.  Disposition Plan: 1-2 days  Consultants:  None  Procedures:  None  Antibiotics: Anti-infectives (From admission, onward)   Start     Dose/Rate Route Frequency Ordered Stop   08/11/17 0900  doxycycline (VIBRAMYCIN) 100 mg in dextrose 5 % 250 mL IVPB     100 mg  125 mL/hr over 120 Minutes Intravenous Every 12 hours 08/11/17 0813     08/11/17 0845  cefTRIAXone (ROCEPHIN) 1 g in dextrose 5 % 50 mL IVPB     1 g 100 mL/hr over 30 Minutes Intravenous Every 24 hours 08/11/17 0813     08/10/17 2000  levofloxacin (LEVAQUIN) IVPB 500 mg  Status:  Discontinued     500 mg 100  mL/hr over 60 Minutes Intravenous Every 24 hours 08/10/17 1927 08/11/17 0803      HPI/Subjective: Appears to be calm and comfortable today.  Lower extremity edema improving.  Objective: Vitals:   08/12/17 1752 08/12/17 2257 08/13/17 0447 08/13/17 0933  BP: 125/85 130/69 130/67   Pulse: 79 77 94 60  Resp: 18 18 18 20   Temp: 97.8 F (36.6 C) (!) 97.5 F (36.4 C) (!) 97 F (36.1 C)   TempSrc: Axillary Axillary Oral   SpO2: 96% 95% 95% 95%  Weight:   78.4 kg (172 lb 13.5 oz)   Height:        Intake/Output Summary (Last 24 hours) at 08/13/2017 1418 Last data filed at 08/13/2017 1154 Gross per 24 hour  Intake 1010 ml  Output 3275 ml  Net -2265 ml    Exam:  Examination:  General exam: Currently calm and comfortable after medication Respiratory system: clear to auscultation. Respiratory effort normal. Cardiovascular system: S1 & S2 heard, RRR. No JVD, murmurs, rubs, gallops or clicks. No pedal edema. Gastrointestinal system: Abdomen is nondistended, soft and nontender. No organomegaly or masses felt. Normal bowel sounds heard. Central nervous system: Hard of hearing, able to follow commands, moving all extremities, gait instability, gait not assessed at this time. Extremities: Symmetric 5 x 5 power.  Lower extremity edema improving Skin: Right thigh nonspecific nonblanching rash, no open lesions noted at this time Psychiatry: Impaired Judgement and insight    Data Reviewed: I have personally reviewed following labs and imaging studies  Micro Results Recent Results (from the past 240 hour(s))  MRSA PCR Screening     Status: None   Collection Time: 08/11/17  6:14 PM  Result Value Ref Range Status   MRSA by PCR NEGATIVE NEGATIVE Final    Comment:        The GeneXpert MRSA Assay (FDA approved for NASAL specimens only), is one component of a comprehensive MRSA colonization surveillance program. It is not intended to diagnose MRSA infection nor to guide or monitor treatment  for MRSA infections.     Radiology Reports Ct Head Wo Contrast  Result Date: 08/12/2017 CLINICAL DATA:  84 y/o  M; altered mental status.  Hallucinations. EXAM: CT HEAD WITHOUT CONTRAST TECHNIQUE: Contiguous axial images were obtained from the base of the skull through the vertex without intravenous contrast. COMPARISON:  04/06/2014 CT head. FINDINGS: Brain: No evidence of acute infarction, hemorrhage, hydrocephalus, extra-axial collection or mass lesion/mass effect. Stable chronic microvascular ischemic changes and parenchymal volume loss of the brain. Vascular: Calcific atherosclerosis of carotid siphons. No hyperdense vessel. Skull: Normal. Negative for fracture or focal lesion. Sinuses/Orbits: No acute finding. Other: Bilateral intra-ocular lens replacement. IMPRESSION: 1. No acute intracranial abnormality identified. 2. Stable chronic microvascular ischemic changes and parenchymal volume loss of the brain. Electronically Signed   By: Kristine Garbe M.D.   On: 08/12/2017 21:06   Mr Brain Wo Contrast  Result Date: 08/12/2017 CLINICAL DATA:  84 y/o  M; altered mental status and hallucinations. EXAM: MRI HEAD WITHOUT CONTRAST TECHNIQUE: Axial DWI, coronal DWI, axial T2 FLAIR propeller, axial  T2 propeller sequences were acquired. As patient was disoriented additional sequences were not acquired. COMPARISON:  08/12/2017 and 04/06/2014 CT head. FINDINGS: Severe motion degradation of all sequences. No reduced diffusion identified to suggest acute or early subacute infarction. No focal mass effect. Parenchymal volume loss and chronic microvascular ischemic changes of the brain are grossly stable from prior studies. IMPRESSION: Severe motion artifact. No acute/early subacute stroke or mass effect identified. Consider repeat imaging when patient is able to follow commands and hold still. Electronically Signed   By: Kristine Garbe M.D.   On: 08/12/2017 22:37   Dg Chest Port 1 View  Result  Date: 08/10/2017 CLINICAL DATA:  Shortness of breath EXAM: PORTABLE CHEST 1 VIEW COMPARISON:  March 24, 2017 FINDINGS: The mediastinal contour is normal. Heart size is enlarged. The aorta is tortuous. There is patchy consolidation of left lung base. Bilateral pleural effusions are noted. There is mild interstitial edema. The visualized skeletal structures are unremarkable. IMPRESSION: Mild congestive heart failure. Patchy consolidation of left lung base, pneumonia is not excluded. Small bilateral pleural effusions. Electronically Signed   By: Abelardo Diesel M.D.   On: 08/10/2017 17:49     CBC Recent Labs  Lab 08/10/17 1610 08/10/17 2037 08/11/17 0543  WBC 7.2 7.0 7.3  HGB 15.5 15.3 15.6  HCT 46.8 45.1 47.0  PLT 137* 129* 128*  MCV 98.7 97.8 97.5  MCH 32.7 33.2 32.4  MCHC 33.1 33.9 33.2  RDW 15.5 15.4 15.4  LYMPHSABS  --   --  1.1  MONOABS  --   --  0.5  EOSABS  --   --  0.1  BASOSABS  --   --  0.0    Chemistries  Recent Labs  Lab 08/10/17 1610 08/10/17 2037 08/11/17 0543 08/12/17 0428 08/13/17 0520  NA 136  --  139 141 140  K 4.5  --  3.8 3.1* 3.0*  CL 105  --  108 111 103  CO2 17*  --  22 21* 27  GLUCOSE 96  --  87 90 92  BUN 35*  --  34* 32* 28*  CREATININE 1.65* 1.53* 1.53* 1.40* 1.38*  CALCIUM 9.0  --  9.0 8.4* 8.5*  AST  --  23  --   --   --   ALT  --  15*  --   --   --   ALKPHOS  --  93  --   --   --   BILITOT  --  1.4*  --   --   --    ------------------------------------------------------------------------------------------------------------------ estimated creatinine clearance is 37.9 mL/min (A) (by C-G formula based on SCr of 1.38 mg/dL (H)). ------------------------------------------------------------------------------------------------------------------ Recent Labs    08/11/17 0543  HGBA1C 5.5   ------------------------------------------------------------------------------------------------------------------ No results for input(s): CHOL, HDL, LDLCALC,  TRIG, CHOLHDL, LDLDIRECT in the last 72 hours. ------------------------------------------------------------------------------------------------------------------ Recent Labs    08/11/17 1819  TSH 7.512*   ------------------------------------------------------------------------------------------------------------------ No results for input(s): VITAMINB12, FOLATE, FERRITIN, TIBC, IRON, RETICCTPCT in the last 72 hours.  Coagulation profile No results for input(s): INR, PROTIME in the last 168 hours.  No results for input(s): DDIMER in the last 72 hours.  Cardiac Enzymes Recent Labs  Lab 08/10/17 1610  TROPONINI 0.04*   ------------------------------------------------------------------------------------------------------------------ Invalid input(s): POCBNP   CBG: Recent Labs  Lab 08/11/17 2121  GLUCAP 124*    Studies: Ct Head Wo Contrast  Result Date: 08/12/2017 CLINICAL DATA:  84 y/o  M; altered mental status.  Hallucinations. EXAM: CT HEAD WITHOUT  CONTRAST TECHNIQUE: Contiguous axial images were obtained from the base of the skull through the vertex without intravenous contrast. COMPARISON:  04/06/2014 CT head. FINDINGS: Brain: No evidence of acute infarction, hemorrhage, hydrocephalus, extra-axial collection or mass lesion/mass effect. Stable chronic microvascular ischemic changes and parenchymal volume loss of the brain. Vascular: Calcific atherosclerosis of carotid siphons. No hyperdense vessel. Skull: Normal. Negative for fracture or focal lesion. Sinuses/Orbits: No acute finding. Other: Bilateral intra-ocular lens replacement. IMPRESSION: 1. No acute intracranial abnormality identified. 2. Stable chronic microvascular ischemic changes and parenchymal volume loss of the brain. Electronically Signed   By: Kristine Garbe M.D.   On: 08/12/2017 21:06   Mr Brain Wo Contrast  Result Date: 08/12/2017 CLINICAL DATA:  84 y/o  M; altered mental status and hallucinations. EXAM:  MRI HEAD WITHOUT CONTRAST TECHNIQUE: Axial DWI, coronal DWI, axial T2 FLAIR propeller, axial T2 propeller sequences were acquired. As patient was disoriented additional sequences were not acquired. COMPARISON:  08/12/2017 and 04/06/2014 CT head. FINDINGS: Severe motion degradation of all sequences. No reduced diffusion identified to suggest acute or early subacute infarction. No focal mass effect. Parenchymal volume loss and chronic microvascular ischemic changes of the brain are grossly stable from prior studies. IMPRESSION: Severe motion artifact. No acute/early subacute stroke or mass effect identified. Consider repeat imaging when patient is able to follow commands and hold still. Electronically Signed   By: Kristine Garbe M.D.   On: 08/12/2017 22:37      Lab Results  Component Value Date   HGBA1C 5.5 08/11/2017   HGBA1C  06-03-202010    5.3 (NOTE) The ADA recommends the following therapeutic goal for glycemic control related to Hgb A1c measurement: Goal of therapy: <6.5 Hgb A1c  Reference: American Diabetes Association: Clinical Practice Recommendations 2010, Diabetes Care, 2010, 33: (Suppl  1).   Lab Results  Component Value Date   LDLCALC 77 12/21/2016   CREATININE 1.38 (H) 08/13/2017    Scheduled Meds: . aspirin EC  81 mg Oral Daily  . atorvastatin  40 mg Oral Daily  . carvedilol  3.125 mg Oral BID WC  . furosemide  40 mg Intravenous Q12H  . lisinopril  2.5 mg Oral Daily  . potassium chloride  40 mEq Oral BID  . sodium chloride flush  3 mL Intravenous Q12H  . temazepam  15 mg Oral QHS   Continuous Infusions: . sodium chloride    . cefTRIAXone (ROCEPHIN)  IV Stopped (08/13/17 1040)  . doxycycline (VIBRAMYCIN) IV Stopped (08/13/17 1040)     LOS: 3 days    Time spent: >30 MINS    Yaakov Guthrie, MD  [facility] Pager [phone]. If 7PM-7AM, please contact night-coverage at [email or web address], password [facility] 08/13/2017, 2:18 PM  LOS: 3 days

## 2017-08-13 NOTE — Progress Notes (Signed)
  Echocardiogram 2D Echocardiogram has been performed.  Darlina Sicilian M 08/13/2017, 9:07 AM

## 2017-08-13 NOTE — Clinical Social Work Note (Signed)
Clinical Social Work Assessment  Patient Details  Name: Timothy Suarez MRN: [id number] Date of Birth: 22-Jun-1925  Date of referral:  08/13/17               Reason for consult:  Facility Placement, Discharge Planning                Permission sought to share information with:  Facility Sport and exercise psychologist, Family Supports Permission granted to share information::  Yes, Verbal Permission Granted  Name::     [name]  Agency::  SNF's  Relationship::  Daughter  Contact Information:  [phone]  Housing/Transportation Living arrangements for the past 2 months:  [place] of Information:  Medical Team, Adult Children Patient Interpreter Needed:  None Criminal Activity/Legal Involvement Pertinent to Current Situation/Hospitalization:  No - Comment as needed Significant Relationships:  Adult Children Lives with:  Self Do you feel safe going back to the place where you live?  Yes Need for family participation in patient care:  Yes (Comment)  Care giving concerns:  PT recommending SNF once medically stable for discharge.   Social Worker assessment / plan:  Patient only oriented to self. Sitter at bedside. CSW called patient's daughter, introduced role, and explained that PT recommendations would be discussed. She confirmed that patient is from [place] and she is agreeable to SNF placement. Patient has been to [facility] in the past and it is close to his daughter's home. Patient's sitter will have to be discontinued 24 hours prior to discharge to SNF. CSW will email patient's daughter a SNF list to review. No further concerns. CSW encouraged patient's daughter to contact CSW as needed. CSW will continue to follow patient and his daughter for support and facilitate discharge to SNF once medically stable.  Employment status:  Retired Nurse, adult PT Recommendations:  [place] / Referral to  community resources:  [place]  Patient/Family's Response to care:  Patient not fully oriented. Patient's daughter agreeable to SNF placement. Patient's children supportive and involved in patient's care. Patient's daughter appreciated social work intervention.  Patient/Family's Understanding of and Emotional Response to Diagnosis, Current Treatment, and Prognosis:  Patient not fully oriented. Patient's daughter has a good understanding of the reason for admission and his need for rehab prior to returning to [place]. Patient's daughter appears happy with hospital care.  Emotional Assessment Appearance:  Appears stated age Attitude/Demeanor/Rapport:  Unable to Assess Affect (typically observed):  Unable to Assess Orientation:  Oriented to Self Alcohol / Substance use:  Never Used Psych involvement (Current and /or in the community):  No (Comment)  Discharge Needs  Concerns to be addressed:  Care Coordination Readmission within the last 30 days:  No Current discharge risk:  Cognitively Impaired, Dependent with Mobility, Lives alone Barriers to Discharge:  Continued Medical Work up, Requiring sitter/restraints   Candie Chroman, LCSW 08/13/2017, 4:10 PM

## 2017-08-13 NOTE — Care Management Important Message (Signed)
Important Message  Patient Details  Name: Timothy Suarez MRN: [id number] Date of Birth: 30-Aug-1924   Medicare Important Message Given:  Yes    [name] 08/13/2017, 1:03 PM

## 2017-08-13 NOTE — NC FL2 (Signed)
[place] MEDICAID FL2 LEVEL OF CARE SCREENING TOOL     IDENTIFICATION  Patient Name: Timothy Suarez Birthdate: 06-20-1925 Sex: male Admission Date (Current Location): 08/10/2017  [facility] and [place] Number:  Herbalist and Address:  [facility], [place] [address] [place], [place] [address]      Provider Number: [id number]  Attending Physician Name and Address:  Yaakov Guthrie, MD  Relative Name and Phone Number:       Current Level of Care: Hospital Recommended Level of Care: [place] Prior Approval Number:    Date Approved/Denied:   PASRR Number: [id number]  Discharge Plan: SNF    Current Diagnoses: Patient Active Problem List   Diagnosis Date Noted  . Systolic CHF, acute on chronic ([place]) 08/10/2017  . Hallucination 08/10/2017  . CHF (congestive heart failure) ([place]) 08/10/2017  . Balance problems 07/08/2017  . Baker cyst, left 07/08/2017  . Dilated cardiomyopathy ([place]) 04/19/2017  . Chronic systolic congestive heart failure ([place]) 03/30/2017  . Cough   . Pulmonary edema 12/06/2016  . H/O laryngectomy 03/26/2016  . Protein-calorie malnutrition, severe 02/26/2016  . Pharyngocutaneous fistula 02/21/2016  . S/P laryngectomy 01/15/2016  . S/P biopsy 01/15/2016  . Baker's cyst of knee 12/09/2015  . Other fatigue 12/09/2015  . 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 Prostate cancer ([place]) 01/10/2015  . S/P MVR (mitral valve repair) 12/20/2014  . S/P Maze operation for atrial fibrillation 12/20/2014  . Obesity (BMI 30-39.9) 04/02/2014  . Prolonged Q-T interval on ECG 04/02/2014  . Mild cognitive impairment 04/02/2014  . Major depressive disorder, recurrent episode, mild ([place]) 02/08/2014  . Insomnia 02/08/2014  . Dizziness and giddiness 02/08/2014  . Generalized muscle weakness 02/08/2014  . Cerumen impaction 02/08/2014  . Glaucoma 02/08/2014  . Colon cancer ([place])   .  Coronary artery disease   . Moderate aortic insufficiency 01/03/2013  . Mild mitral regurgitation 01/03/2013  . MVP (mitral valve prolapse)   . Paroxysmal atrial fibrillation (HCC)   . HTN (hypertension)   . Depression   . chest wall hernia (lung hernia)     Orientation RESPIRATION BLADDER Height & Weight     Self  Normal Incontinent Weight: 172 lb 13.5 oz (78.4 kg) Height:  5\' 11"  (180.3 cm)  BEHAVIORAL SYMPTOMS/MOOD NEUROLOGICAL BOWEL NUTRITION STATUS  Other (Comment)(Agitated, Irritable, physically aggressive. Was calm and cooperative during PT/OT evaluations. Has been having hallucinations recently but no prior history. Typically independent at home.) (None) Continent Diet(2 gram sodium.)  AMBULATORY STATUS COMMUNICATION OF NEEDS Skin   Limited Assist Verbally Other (Comment)(Petechiae.)                       Personal Care Assistance Level of Assistance  Bathing, Feeding, Dressing Bathing Assistance: Limited assistance Feeding assistance: Limited assistance Dressing Assistance: Limited assistance     Functional Limitations Info  Sight, Hearing, Speech Sight Info: Adequate Hearing Info: Adequate Speech Info: Adequate    SPECIAL CARE FACTORS FREQUENCY  PT (By licensed PT), OT (By licensed OT)     PT Frequency: 5 x week OT Frequency: 5 x week            Contractures Contractures Info: Not present    Additional Factors Info  Code Status, Allergies, Psychotropic Code Status Info: Full Allergies Info: Ambien (Zolpidem), Citalopram, Trazodone And Nefazodone, Zoloft (Sertraline Hcl) Psychotropic Info: Depression, hallucinations: No psychotropic meds.  Current Medications (08/13/2017):  This is the current hospital active medication list Current Facility-Administered Medications  Medication Dose Route Frequency Provider Last Rate Last Dose  . 0.9 %  sodium chloride infusion  250 mL Intravenous PRN Gala Romney L, MD      . acetaminophen (TYLENOL)  tablet 650 mg  650 mg Oral Q4H PRN Elwyn Reach, MD      . aspirin EC tablet 81 mg  81 mg Oral Daily Elwyn Reach, MD   81 mg at 08/13/17 0837  . atorvastatin (LIPITOR) tablet 40 mg  40 mg Oral Daily Elwyn Reach, MD   40 mg at 08/13/17 0836  . carvedilol (COREG) tablet 3.125 mg  3.125 mg Oral BID WC Gala Romney L, MD   3.125 mg at 08/13/17 0837  . cefTRIAXone (ROCEPHIN) 1 g in dextrose 5 % 50 mL IVPB  1 g Intravenous Q24H Reyne Dumas, MD   Stopped at 08/13/17 1040  . doxycycline (VIBRAMYCIN) 100 mg in dextrose 5 % 250 mL IVPB  100 mg Intravenous Q12H Reyne Dumas, MD   Stopped at 08/13/17 1040  . furosemide (LASIX) injection 40 mg  40 mg Intravenous Q12H Elwyn Reach, MD   40 mg at 08/13/17 0836  . lisinopril (PRINIVIL,ZESTRIL) tablet 2.5 mg  2.5 mg Oral Daily Gala Romney L, MD   2.5 mg at 08/13/17 0838  . potassium chloride SA (K-DUR,KLOR-CON) CR tablet 40 mEq  40 mEq Oral BID Matcha, Anupama, MD   40 mEq at 08/13/17 1235  . sodium chloride flush (NS) 0.9 % injection 3 mL  3 mL Intravenous Q12H Elwyn Reach, MD   3 mL at 08/13/17 0838  . sodium chloride flush (NS) 0.9 % injection 3 mL  3 mL Intravenous PRN Gala Romney L, MD      . temazepam (RESTORIL) capsule 15 mg  15 mg Oral QHS Reyne Dumas, MD   15 mg at 08/12/17 2306     Discharge Medications: Please see discharge summary for a list of discharge medications.  Relevant Imaging Results:  Relevant Lab Results:   Additional Information SS#: [id number]. From [place]. Currently has a sitter but will be discontinued 24 hours prior to discharge.  Candie Chroman, LCSW

## 2017-08-13 NOTE — Evaluation (Signed)
Physical Therapy Evaluation Patient Details Name: Timothy Suarez MRN: [id number] DOB: 09-Sep-1924 Today's Date: 08/13/2017   History of Present Illness  84yo male with B LE edema, hallucinations, orthopnea, PND with old trach site which per MD note patient had been independent in self-suctioning at home. Dx with acute on chronic CHF, hallucinations. PMH A-fib, aortic valve insufficiency, cerbral ischemia, hx colon CA, CAD, HTN, PAF, thoracic AAA, hx cardiac cath, hx joint replacements, vocal cord CA, hx multiple tracheoesophageal surgeries   Clinical Impression   Co-evaluation performed with OT due to safety concerns for patient and therapists. Patient received in bed with sitter present, resting quietly; able to wake patient, who remained very calm and pleasant throughout evaluation this morning. He does require Min guard to min assist for bed mobility, as well as Mod assist x2 for functional transfers from low surfaces/Min assist x2 from elevated surfaces. Patient is able to ambulate fairly well with RW, however does need Min assist for navigation and safety with device, especially during turns in small spaces such as that for bathroom transfers. Note patient is non-verbal however appears to be able to express himself/receive cues with hand signals, also follows simple verbal commands well today. Unsure regarding PLOF and supervision/equipment available to patient, thus recommend SNF moving forward. Patient left in bed with all needs met and bed alarm activated, sitter present.     Follow Up Recommendations SNF;Other (comment);Supervision/Assistance - 24 hour(if patient/family refuse SNF, he will require home aide, HHPT, and 24/7 supervision )    Equipment Recommendations  None recommended by PT    Recommendations for Other Services       Precautions / Restrictions Precautions Precautions: Fall;Other (comment) Precaution Comments: intermittent agitation/combativeness, non-verbal   Restrictions Weight Bearing Restrictions: No      Mobility  Bed Mobility Overal bed mobility: Needs Assistance Bed Mobility: Supine to Sit;Sit to Supine     Supine to sit: Min guard(visual cues for encouragement ) Sit to supine: Min assist   General bed mobility comments: Min assist with return to bed for LE management   Transfers Overall transfer level: Needs assistance Equipment used: Rolling walker (2 wheeled) Transfers: Sit to/from Stand Sit to Stand: Mod assist;Min assist         General transfer comment: Mod assist x2 for transfer from low surface, Min assist x2 for transfer from elevated surface   Ambulation/Gait Ambulation/Gait assistance: Min assist Ambulation Distance (Feet): 30 Feet Assistive device: Rolling walker (2 wheeled) Gait Pattern/deviations: Step-through pattern;Decreased step length - right;Decreased step length - left;Decreased stride length;Drifts right/left;Trunk flexed     General Gait Details: Min assist for safe navigation and use of assistive device; note significant difficulty navigating turns in small space such as for bathroom transfer   Stairs            Wheelchair Mobility    Modified Rankin (Stroke Patients Only)       Balance Overall balance assessment: Needs assistance Sitting-balance support: Bilateral upper extremity supported;Feet supported Sitting balance-Leahy Scale: Good     Standing balance support: Bilateral upper extremity supported;During functional activity Standing balance-Leahy Scale: Fair                               Pertinent Vitals/Pain Pain Assessment: Faces Faces Pain Scale: No hurt Pain Intervention(s): Monitored during session;Limited activity within patient's tolerance    Home Living Family/patient expects to be discharged to:: Unsure  Additional Comments: pt resides at [facility] independent living . Pt nodding that he has assist for cooking and  cleaning(unsure, took this history from prior notes in chart )    Prior Function           Comments: unsure regarding current level of independence, prior notes in chart indicate I with RW     Hand Dominance   Dominant Hand: Right    Extremity/Trunk Assessment   Upper Extremity Assessment Upper Extremity Assessment: Defer to OT evaluation    Lower Extremity Assessment Lower Extremity Assessment: Overall WFL for tasks assessed    Cervical / Trunk Assessment Cervical / Trunk Assessment: Kyphotic  Communication   Communication: HOH;Other (comment)(non-verbal )  Cognition Arousal/Alertness: Awake/alert Behavior During Therapy: WFL for tasks assessed/performed Overall Cognitive Status: No family/caregiver present to determine baseline cognitive functioning                                 General Comments: patient with history of agitation/combativness with nursing, mild cognitive impairemnt per chart reveiw of [place]       General Comments      Exercises     Assessment/Plan    PT Assessment Patient needs continued PT services  PT Problem List Decreased strength;Decreased mobility;Decreased safety awareness;Decreased coordination;Decreased activity tolerance;Decreased balance       PT Treatment Interventions DME instruction;Therapeutic activities;Therapeutic exercise;Gait training;Patient/family education;Stair training;Balance training;Functional mobility training;Neuromuscular re-education    PT Goals (Current goals can be found in the Care Plan section)  Acute Rehab PT Goals PT Goal Formulation: Patient unable to participate in goal setting    Frequency Min 2X/week   Barriers to discharge        Co-evaluation PT/OT/SLP Co-Evaluation/Treatment: Yes Reason for Co-Treatment: For patient/therapist safety PT goals addressed during session: Mobility/safety with mobility;Proper use of DME         AM-PAC PT "6 Clicks" Daily Activity  Outcome  Measure Difficulty turning over in bed (including adjusting bedclothes, sheets and blankets)?: Unable Difficulty moving from lying on back to sitting on the side of the bed? : Unable Difficulty sitting down on and standing up from a chair with arms (e.g., wheelchair, bedside commode, etc,.)?: Unable Help needed moving to and from a bed to chair (including a wheelchair)?: A Little Help needed walking in hospital room?: A Little Help needed climbing 3-5 steps with a railing? : A Lot 6 Click Score: 11    End of Session Equipment Utilized During Treatment: Gait belt Activity Tolerance: Patient tolerated treatment well Patient left: in bed;with nursing/sitter in room;with call bell/phone within reach;with bed alarm set Nurse Communication: Other (comment)(nursing sitter present throughout session, aware of patient's mobility with PT/OT ) PT Visit Diagnosis: Unsteadiness on feet (R26.81);Muscle weakness (generalized) (M62.81);Difficulty in walking, not elsewhere classified (R26.2);Other abnormalities of gait and mobility (R26.89)    Time: 5638-7564 PT Time Calculation (min) (ACUTE ONLY): 27 min   Charges:   PT Evaluation $PT Eval Moderate Complexity: 1 Mod     PT G Codes:        Deniece Ree PT, DPT, CBIS  Supplemental Physical Therapist [place]   Pager [phone]

## 2017-08-13 NOTE — Clinical Social Work Placement (Signed)
   CLINICAL SOCIAL WORK PLACEMENT  NOTE  Date:  08/13/2017  Patient Details  Name: Timothy Suarez MRN: [id number] Date of Birth: July 16, 1925  Clinical Social Work is seeking post-discharge placement for this patient at the [place] level of care (*CSW will initial, date and re-position this form in  chart as items are completed):  Yes   Patient/family provided with [place] Work Department's list of facilities offering this level of care within the geographic area requested by the patient (or if unable, by the patient's family).  Yes   Patient/family informed of their freedom to choose among providers that offer the needed level of care, that participate in Medicare, Medicaid or managed care program needed by the patient, have an available bed and are willing to accept the patient.  Yes   Patient/family informed of 's ownership interest in [facility] and [facility], as well as of the fact that they are under no obligation to receive care at these facilities.  PASRR submitted to EDS on 08/13/17     PASRR number received on       Existing PASRR number confirmed on 08/13/17     FL2 transmitted to all facilities in geographic area requested by pt/family on 08/13/17     FL2 transmitted to all facilities within larger geographic area on       Patient informed that his/her managed care company has contracts with or will negotiate with certain facilities, including the following:            Patient/family informed of bed offers received.  Patient chooses bed at       Physician recommends and patient chooses bed at      Patient to be transferred to   on  .  Patient to be transferred to facility by       Patient family notified on   of transfer.  Name of family member notified:        PHYSICIAN Please sign FL2     Additional Comment:    _______________________________________________ Candie Chroman, LCSW 08/13/2017,  4:13 PM

## 2017-08-13 NOTE — Consult Note (Signed)
   [facility] CM Inpatient Consult   08/13/2017  Bren Steers [facility] 26-Jul-1925 [id number]  Patient screened for potential [place] Management services. Patient is in the [facility] of the [place] Management services under patient's [employer] plan. Chart reviewed and reveals that Physical therpay and Occupational therapy are recommending a skilled rehab stay.  Patient noted to be from [place] currently. No set disposition noted.  No current community follow up with [place] management needed. For questions contact:   Natividad Brood, RN BSN [facility] Liaison  [phone] business mobile phone Toll free office [phone]

## 2017-08-14 ENCOUNTER — Inpatient Hospital Stay (HOSPITAL_COMMUNITY): Payer: Medicare Other

## 2017-08-14 LAB — CBC
HCT: 49.3 % (ref 39.0–52.0)
Hemoglobin: 16 g/dL (ref 13.0–17.0)
MCH: 32.1 pg (ref 26.0–34.0)
MCHC: 32.5 g/dL (ref 30.0–36.0)
MCV: 98.8 fL (ref 78.0–100.0)
PLATELETS: 148 10*3/uL — AB (ref 150–400)
RBC: 4.99 MIL/uL (ref 4.22–5.81)
RDW: 15 % (ref 11.5–15.5)
WBC: 9.8 10*3/uL (ref 4.0–10.5)

## 2017-08-14 LAB — BASIC METABOLIC PANEL
Anion gap: 13 (ref 5–15)
BUN: 29 mg/dL — AB (ref 6–20)
CALCIUM: 8.9 mg/dL (ref 8.9–10.3)
CO2: 25 mmol/L (ref 22–32)
CREATININE: 1.44 mg/dL — AB (ref 0.61–1.24)
Chloride: 101 mmol/L (ref 101–111)
GFR calc non Af Amer: 41 mL/min — ABNORMAL LOW (ref >60)
GFR, EST AFRICAN AMERICAN: 48 mL/min — AB (ref >60)
GLUCOSE: 89 mg/dL (ref 65–99)
Potassium: 4.3 mmol/L (ref 3.5–5.1)
Sodium: 139 mmol/L (ref 135–145)

## 2017-08-14 MED ORDER — FUROSEMIDE 10 MG/ML IJ SOLN
20.0000 mg | Freq: Two times a day (BID) | INTRAMUSCULAR | Status: DC
  Administered 2017-08-14 – 2017-08-16 (×5): 20 mg via INTRAVENOUS
  Filled 2017-08-14 (×5): qty 2

## 2017-08-14 NOTE — Plan of Care (Signed)
  Clinical Measurements: Will remain free from infection 08/14/2017 0554 - Progressing by Tristan Schroeder, RN   Clinical Measurements: Diagnostic test results will improve 08/14/2017 0554 - Progressing by Tristan Schroeder, RN   Clinical Measurements: Respiratory complications will improve 08/14/2017 0554 - Progressing by Tristan Schroeder, RN

## 2017-08-14 NOTE — Progress Notes (Signed)
Patient confused and pulling sheets off of bed, Rn assessed patients breath/O2. O2 sats 89-94 on room air. Patient dyspneic on exertion. States right knee hurts. Tylenol given.

## 2017-08-14 NOTE — Progress Notes (Addendum)
Pt has Left knee Bakers cyst, and usually has it drained at [facility].  Daughter has requested that it be done here during this admission.  Have notified Dr. Barth Kirks. ______________________________________________  Dr. Barth Kirks has ordered DG Left knee xray for assessment of drainage of Bakers cyst, if needed.

## 2017-08-14 NOTE — Progress Notes (Signed)
[facility] PROGRESS NOTE  Timothy Suarez JSH:[id number] DOB: [birth year] DOA: 08/10/2017   PCP: Gayland Curry, DO     Assessment/Plan: Principal Problem:   Systolic CHF, acute on chronic (HCC) Active Problems:   Coronary artery disease   Mild cognitive impairment   Pharyngocutaneous fistula   Hallucination   CHF (congestive heart failure) ([place])    Patient is 84 y.o. male with medical history significant of systolic dysfunction CHF with last EF of 25% here with  Progressive lower extremity edema as well as hallucinations. He has been on Lasix 40 mg daily at home. Patient presented with signs and symptoms of CHF exacerbation.  Assessment and plan  #1 acute on chronic systolic dysfunction CHF: Patient had significant bilateral lower extremity edema. BNP markedly elevated greater than 2000. Chest x-ray consistent with small bilateral pleural effusions and congestive heart failure.  Possibly could also have pneumonia in the left lung base.   procalcitonin normal.   Continue IV Lasix 40 mm twice a day. On low-dose ACE inhibitor and beta blockers. Most recent 2-D echo in May 2018 showed EF of 25-30%.   Repeat Echocardiogram showing 20-25%.  Patient does have a history of low blood pressure in the past and hence was not previously on a beta blocker. On lisinopril 2.5 mg daily and Coreg 3.125 mg twice a day.  Primary cardiologist is Dr. Radford Pax. Monitor input and output closely.  His creatinine 1.44 today.   Will order a.m. labs.  The creatinine is still high then will consider holding lisinopril, decreasing Lasix dose.  #2 hallucinations: Patient has mild cognitive impairment but this is new according to family.  Could be secondary to the above as well as possible pneumonia.  UA unremarkable. Discontinued levofloxacin, avoid fluoroquinolones in the elderly. Agitated at times but appears calm at this time.   MRI of the brain did not show any acute/early subacute stroke or  mass.  Severe motion artifact.  He looks better at this time.  However if his hallucinations recur will consider consulting psychiatry.  #3 Pharyngocutaneous fistula: Patient will require frequent cleaning and suctioning. So far no evidence of complications  #4 coronary artery disease: Patient appears stable at this point.  #5 possible pneumonia: Empirically was started on Levaquin which has been discontinued.  Switched to Rocephin/doxycycline now.  Pro-calcitonin less than 0.10.   No fever and no white count however.  Avoid azithromycin due to QTC prolongation.  Since he is improving we will continue antibiotics for now.  #6 acute on chronic kidney disease:  Creatinine at the time of admission was 1.65. It was 1.1 back in November. We will watch kidney function closely especially with IV Lasix and lisinopril.  Today 1.44.  #7 mild thrombocytopenia: Patient on Lovenox for DVT prophylaxis.  His platelet count decreased to 128. Discontinued Lovenox and placed him on lower extremity SCDs.  Today platelet count 148.  Continue to monitor platelet count.  Nonspecific rash right upper thigh: Exact etiology unclear at this time.   Now has scabs. No bedbugs per the daughter.  Monitor for now.  Debility: Physical therapy consulted.  DVT prophylaxsis switched to SCD's due to thrombocytopenia.  Code Status: Full code      Family Communication: Family at the bedside at this time.  I discussed the plan of care with his daughter yesterday.  Disposition Plan: 1-2 days  Consultants:  None  Procedures:  None  Antibiotics: Anti-infectives (From admission, onward)   Start  Dose/Rate Route Frequency Ordered Stop   08/11/17 0900  doxycycline (VIBRAMYCIN) 100 mg in dextrose 5 % 250 mL IVPB     100 mg 125 mL/hr over 120 Minutes Intravenous Every 12 hours 08/11/17 0813     08/11/17 0845  cefTRIAXone (ROCEPHIN) 1 g in dextrose 5 % 50 mL IVPB     1 g 100 mL/hr over 30 Minutes Intravenous  Every 24 hours 08/11/17 0813     08/10/17 2000  levofloxacin (LEVAQUIN) IVPB 500 mg  Status:  Discontinued     500 mg 100 mL/hr over 60 Minutes Intravenous Every 24 hours 08/10/17 1927 08/11/17 0803      HPI/Subjective: Appears to be calm and comfortable at this time.  Was agitated earlier per the nursing staff. Lower extremity edema improving.  Does not have any more hallucinations.   Objective: Vitals:   08/13/17 1426 08/13/17 2034 08/14/17 0309 08/14/17 0556  BP: 101/65 (!) 113/57  (!) 138/58  Pulse: 63 (!) 112  (!) 116  Resp: 16 18  18   Temp:  98.1 F (36.7 C)  (!) 97.5 F (36.4 C)  TempSrc:  Oral  Oral  SpO2:  97% 91% 99%  Weight:    80.6 kg (177 lb 11.1 oz)  Height:        Intake/Output Summary (Last 24 hours) at 08/14/2017 1240 Last data filed at 08/14/2017 1226 Gross per 24 hour  Intake 1250 ml  Output 2050 ml  Net -800 ml    Exam:  Examination:  General exam: Currently calm and comfortable after medication Respiratory system: clear to auscultation. Respiratory effort normal. Cardiovascular system: S1 & S2 heard, RRR. No JVD, murmurs, rubs, gallops or clicks. No pedal edema. Gastrointestinal system: Abdomen is nondistended, soft and nontender. No organomegaly or masses felt. Normal bowel sounds heard. Central nervous system: Hard of hearing, able to follow commands, moving all extremities, gait instability, gait not assessed at this time. Extremities: Symmetric 5 x 5 power.  Lower extremity edema improving Skin: Right thigh nonspecific nonblanching rash, scabbing, no open lesions noted at this time Psychiatry: Impaired Judgement and insight    Data Reviewed: I have personally reviewed following labs and imaging studies  Micro Results Recent Results (from the past 240 hour(s))  MRSA PCR Screening     Status: None   Collection Time: 08/11/17  6:14 PM  Result Value Ref Range Status   MRSA by PCR NEGATIVE NEGATIVE Final    Comment:        The GeneXpert MRSA  Assay (FDA approved for NASAL specimens only), is one component of a comprehensive MRSA colonization surveillance program. It is not intended to diagnose MRSA infection nor to guide or monitor treatment for MRSA infections.     Radiology Reports Ct Head Wo Contrast  Result Date: 08/12/2017 CLINICAL DATA:  84 y/o  M; altered mental status.  Hallucinations. EXAM: CT HEAD WITHOUT CONTRAST TECHNIQUE: Contiguous axial images were obtained from the base of the skull through the vertex without intravenous contrast. COMPARISON:  04/06/2014 CT head. FINDINGS: Brain: No evidence of acute infarction, hemorrhage, hydrocephalus, extra-axial collection or mass lesion/mass effect. Stable chronic microvascular ischemic changes and parenchymal volume loss of the brain. Vascular: Calcific atherosclerosis of carotid siphons. No hyperdense vessel. Skull: Normal. Negative for fracture or focal lesion. Sinuses/Orbits: No acute finding. Other: Bilateral intra-ocular lens replacement. IMPRESSION: 1. No acute intracranial abnormality identified. 2. Stable chronic microvascular ischemic changes and parenchymal volume loss of the brain. Electronically Signed   By: Mia Creek  Furusawa-Stratton M.D.   On: 08/12/2017 21:06   Mr Brain Wo Contrast  Result Date: 08/12/2017 CLINICAL DATA:  84 y/o  M; altered mental status and hallucinations. EXAM: MRI HEAD WITHOUT CONTRAST TECHNIQUE: Axial DWI, coronal DWI, axial T2 FLAIR propeller, axial T2 propeller sequences were acquired. As patient was disoriented additional sequences were not acquired. COMPARISON:  08/12/2017 and 04/06/2014 CT head. FINDINGS: Severe motion degradation of all sequences. No reduced diffusion identified to suggest acute or early subacute infarction. No focal mass effect. Parenchymal volume loss and chronic microvascular ischemic changes of the brain are grossly stable from prior studies. IMPRESSION: Severe motion artifact. No acute/early subacute stroke or mass  effect identified. Consider repeat imaging when patient is able to follow commands and hold still. Electronically Signed   By: Kristine Garbe M.D.   On: 08/12/2017 22:37   Dg Chest Port 1 View  Result Date: 08/10/2017 CLINICAL DATA:  Shortness of breath EXAM: PORTABLE CHEST 1 VIEW COMPARISON:  March 24, 2017 FINDINGS: The mediastinal contour is normal. Heart size is enlarged. The aorta is tortuous. There is patchy consolidation of left lung base. Bilateral pleural effusions are noted. There is mild interstitial edema. The visualized skeletal structures are unremarkable. IMPRESSION: Mild congestive heart failure. Patchy consolidation of left lung base, pneumonia is not excluded. Small bilateral pleural effusions. Electronically Signed   By: Abelardo Diesel M.D.   On: 08/10/2017 17:49     CBC Recent Labs  Lab 08/10/17 1610 08/10/17 2037 08/11/17 0543 08/13/17 1531 08/14/17 0419  WBC 7.2 7.0 7.3 8.0 9.8  HGB 15.5 15.3 15.6 15.8 16.0  HCT 46.8 45.1 47.0 48.8 49.3  PLT 137* 129* 128* 135* 148*  MCV 98.7 97.8 97.5 97.8 98.8  MCH 32.7 33.2 32.4 31.7 32.1  MCHC 33.1 33.9 33.2 32.4 32.5  RDW 15.5 15.4 15.4 15.0 15.0  LYMPHSABS  --   --  1.1  --   --   MONOABS  --   --  0.5  --   --   EOSABS  --   --  0.1  --   --   BASOSABS  --   --  0.0  --   --     Chemistries  Recent Labs  Lab 08/10/17 1610 08/10/17 2037 08/11/17 0543 08/12/17 0428 08/13/17 0520 08/14/17 0419  NA 136  --  139 141 140 139  K 4.5  --  3.8 3.1* 3.0* 4.3  CL 105  --  108 111 103 101  CO2 17*  --  22 21* 27 25  GLUCOSE 96  --  87 90 92 89  BUN 35*  --  34* 32* 28* 29*  CREATININE 1.65* 1.53* 1.53* 1.40* 1.38* 1.44*  CALCIUM 9.0  --  9.0 8.4* 8.5* 8.9  AST  --  23  --   --   --   --   ALT  --  15*  --   --   --   --   ALKPHOS  --  93  --   --   --   --   BILITOT  --  1.4*  --   --   --   --     ------------------------------------------------------------------------------------------------------------------ estimated creatinine clearance is 36.3 mL/min (A) (by C-G formula based on SCr of 1.44 mg/dL (H)). ------------------------------------------------------------------------------------------------------------------ No results for input(s): HGBA1C in the last 72 hours. ------------------------------------------------------------------------------------------------------------------ No results for input(s): CHOL, HDL, LDLCALC, TRIG, CHOLHDL, LDLDIRECT in the last 72 hours. ------------------------------------------------------------------------------------------------------------------ Recent Labs  08/11/17 1819  TSH 7.512*   ------------------------------------------------------------------------------------------------------------------ No results for input(s): VITAMINB12, FOLATE, FERRITIN, TIBC, IRON, RETICCTPCT in the last 72 hours.  Coagulation profile No results for input(s): INR, PROTIME in the last 168 hours.  No results for input(s): DDIMER in the last 72 hours.  Cardiac Enzymes Recent Labs  Lab 08/10/17 1610  TROPONINI 0.04*   ------------------------------------------------------------------------------------------------------------------ Invalid input(s): POCBNP   CBG: Recent Labs  Lab 08/11/17 2121  GLUCAP 124*    Studies: Ct Head Wo Contrast  Result Date: 08/12/2017 CLINICAL DATA:  84 y/o  M; altered mental status.  Hallucinations. EXAM: CT HEAD WITHOUT CONTRAST TECHNIQUE: Contiguous axial images were obtained from the base of the skull through the vertex without intravenous contrast. COMPARISON:  04/06/2014 CT head. FINDINGS: Brain: No evidence of acute infarction, hemorrhage, hydrocephalus, extra-axial collection or mass lesion/mass effect. Stable chronic microvascular ischemic changes and parenchymal volume loss of the brain. Vascular: Calcific  atherosclerosis of carotid siphons. No hyperdense vessel. Skull: Normal. Negative for fracture or focal lesion. Sinuses/Orbits: No acute finding. Other: Bilateral intra-ocular lens replacement. IMPRESSION: 1. No acute intracranial abnormality identified. 2. Stable chronic microvascular ischemic changes and parenchymal volume loss of the brain. Electronically Signed   By: Kristine Garbe M.D.   On: 08/12/2017 21:06   Mr Brain Wo Contrast  Result Date: 08/12/2017 CLINICAL DATA:  84 y/o  M; altered mental status and hallucinations. EXAM: MRI HEAD WITHOUT CONTRAST TECHNIQUE: Axial DWI, coronal DWI, axial T2 FLAIR propeller, axial T2 propeller sequences were acquired. As patient was disoriented additional sequences were not acquired. COMPARISON:  08/12/2017 and 04/06/2014 CT head. FINDINGS: Severe motion degradation of all sequences. No reduced diffusion identified to suggest acute or early subacute infarction. No focal mass effect. Parenchymal volume loss and chronic microvascular ischemic changes of the brain are grossly stable from prior studies. IMPRESSION: Severe motion artifact. No acute/early subacute stroke or mass effect identified. Consider repeat imaging when patient is able to follow commands and hold still. Electronically Signed   By: Kristine Garbe M.D.   On: 08/12/2017 22:37      Lab Results  Component Value Date   HGBA1C 5.5 08/11/2017   HGBA1C  January 17, 202010    5.3 (NOTE) The ADA recommends the following therapeutic goal for glycemic control related to Hgb A1c measurement: Goal of therapy: <6.5 Hgb A1c  Reference: American Diabetes Association: Clinical Practice Recommendations 2010, Diabetes Care, 2010, 33: (Suppl  1).   Lab Results  Component Value Date   LDLCALC 77 12/21/2016   CREATININE 1.44 (H) 08/14/2017    Scheduled Meds: . aspirin EC  81 mg Oral Daily  . atorvastatin  40 mg Oral Daily  . carvedilol  3.125 mg Oral BID WC  . furosemide  40 mg Intravenous  Q12H  . lisinopril  2.5 mg Oral Daily  . sodium chloride flush  3 mL Intravenous Q12H  . temazepam  15 mg Oral QHS   Continuous Infusions: . sodium chloride    . cefTRIAXone (ROCEPHIN)  IV Stopped (08/14/17 0915)  . doxycycline (VIBRAMYCIN) IV Stopped (08/14/17 1137)     LOS: 4 days    Yaakov Guthrie, MD  [facility] Pager [phone]. If 7PM-7AM, please contact night-coverage at [email or web address], password [facility] 08/14/2017, 12:40 PM  LOS: 4 days

## 2017-08-14 NOTE — Progress Notes (Signed)
Respiratory staff came to room to suction pt TEF, as per daughter request.

## 2017-08-14 NOTE — Plan of Care (Signed)
  Education: Knowledge of General Education information will improve 08/14/2017 1032 - Not Progressing by Imagene Gurney, RN

## 2017-08-15 ENCOUNTER — Inpatient Hospital Stay (HOSPITAL_COMMUNITY): Payer: Medicare Other

## 2017-08-15 DIAGNOSIS — M25462 Effusion, left knee: Secondary | ICD-10-CM

## 2017-08-15 LAB — BASIC METABOLIC PANEL
Anion gap: 10 (ref 5–15)
BUN: 26 mg/dL — AB (ref 6–20)
CALCIUM: 8.6 mg/dL — AB (ref 8.9–10.3)
CO2: 28 mmol/L (ref 22–32)
Chloride: 99 mmol/L — ABNORMAL LOW (ref 101–111)
Creatinine, Ser: 1.38 mg/dL — ABNORMAL HIGH (ref 0.61–1.24)
GFR calc Af Amer: 50 mL/min — ABNORMAL LOW (ref >60)
GFR, EST NON AFRICAN AMERICAN: 43 mL/min — AB (ref >60)
Glucose, Bld: 97 mg/dL (ref 65–99)
Potassium: 3.4 mmol/L — ABNORMAL LOW (ref 3.5–5.1)
SODIUM: 137 mmol/L (ref 135–145)

## 2017-08-15 LAB — CBC
HCT: 47.1 % (ref 39.0–52.0)
Hemoglobin: 15.1 g/dL (ref 13.0–17.0)
MCH: 31.5 pg (ref 26.0–34.0)
MCHC: 32.1 g/dL (ref 30.0–36.0)
MCV: 98.1 fL (ref 78.0–100.0)
Platelets: 127 10*3/uL — ABNORMAL LOW (ref 150–400)
RBC: 4.8 MIL/uL (ref 4.22–5.81)
RDW: 15 % (ref 11.5–15.5)
WBC: 7.9 10*3/uL (ref 4.0–10.5)

## 2017-08-15 LAB — GLUCOSE, CAPILLARY
GLUCOSE-CAPILLARY: 93 mg/dL (ref 65–99)
Glucose-Capillary: 84 mg/dL (ref 65–99)

## 2017-08-15 MED ORDER — POTASSIUM CHLORIDE CRYS ER 20 MEQ PO TBCR
20.0000 meq | EXTENDED_RELEASE_TABLET | Freq: Every day | ORAL | Status: DC
  Administered 2017-08-15 – 2017-08-19 (×5): 20 meq via ORAL
  Filled 2017-08-15 (×5): qty 1

## 2017-08-15 MED ORDER — ONDANSETRON HCL 4 MG/2ML IJ SOLN
4.0000 mg | Freq: Four times a day (QID) | INTRAMUSCULAR | Status: DC | PRN
  Administered 2017-08-15 – 2017-08-16 (×2): 4 mg via INTRAVENOUS
  Filled 2017-08-15 (×2): qty 2

## 2017-08-15 NOTE — Clinical Social Work Note (Signed)
CSW spoke with patient's daughter [name], and gave her bed offers, patient's daughter would like weekday CSW to follow up with [facility] and [facility] on Monday to see if they can accept patient.  If not she is considering [facility].  Weekday CSW to continue to follow patient's progress.  Jones Broom. Claude, MSW, [place]  08/15/2017 12:25 PM

## 2017-08-15 NOTE — Progress Notes (Addendum)
[facility] PROGRESS NOTE  VALERIAN JEWEL VWU:[id number] DOB: [birth year] DOA: 08/10/2017   PCP: Gayland Curry, DO     Assessment/Plan: Principal Problem:   Systolic CHF, acute on chronic (HCC) Active Problems:   Coronary artery disease   Mild cognitive impairment   Pharyngocutaneous fistula   Hallucination   CHF (congestive heart failure) ([place])    Patient is 84 y.o. male with medical history significant of systolic dysfunction CHF with last EF of 25% here with  Progressive lower extremity edema as well as hallucinations. He has been on Lasix 40 mg daily at home. Patient presented with signs and symptoms of CHF exacerbation.  Assessment and plan  #1 acute on chronic systolic dysfunction CHF: Patient had significant bilateral lower extremity edema. BNP markedly elevated greater than 2000. Chest x-ray consistent with small bilateral pleural effusions and congestive heart failure.  Possibly could also have pneumonia in the left lung base.   procalcitonin normal.   On IV Lasix 20 mg twice a day. On low-dose ACE inhibitor and beta blockers. Most recent 2-D echo in May 2018 showed EF of 25-30%.   Repeat Echocardiogram showing 20-25%.  Patient does have a history of low blood pressure in the past and hence was not previously on a beta blocker. On lisinopril 2.5 mg daily and Coreg 3.125 mg twice a day (with hold parameters).  Primary cardiologist is Dr. Radford Pax. Monitor input and output closely.  His creatinine 1.38 today.   Will order a.m. labs.  If creatinine high then will consider holding lisinopril, decreasing Lasix dose. -Was bradycardic with somewhat low blood pressure overnight.  Continue to monitor. Consulted cardiology.  #2 hallucinations:  Patient has mild cognitive impairment but this is new according to family.   Could be secondary to the above as well as possible pneumonia.  UA unremarkable. Discontinued levofloxacin, avoid fluoroquinolones in the  elderly. Agitated at times but appears calm at this time.   MRI of the brain did not show any acute/early subacute stroke or mass.  Severe motion artifact.  Hallucinations resolved -Has some sundowning but stable at this time  #3 Pharyngocutaneous fistula:  Patient will require frequent cleaning and suctioning. So far no evidence of complications  #4 coronary artery disease: Patient appears stable at this point.  #5 possible pneumonia: Empirically was started on Levaquin which has been discontinued.   Received 5 days of Rocephin/doxycycline now.  Pro-calcitonin less than 0.10.   No fever and no white count however.  Avoid azithromycin due to QTC prolongation.   Afebrile.  Stable.  We will discontinue antibiotics.  #6 acute on chronic kidney disease:  Creatinine at the time of admission was 1.65. It was 1.1 back in November.  Monitor kidney function closely especially with IV Lasix and lisinopril.   Today 1.38.  Thrombocytopenia:  Patient was on Lovenox for DVT prophylaxis.   His platelet count decreased to 127. Discontinued Lovenox and placed him on lower extremity SCDs.   Continue to monitor platelet count.  Nonspecific rash right upper thigh:  Exact etiology unclear at this time.   Improving. No bedbugs per the daughter.    Left knee effusion Patient has Baker's cyst. Gets periodic periodic aspiration by radiology. Daughter requesting if it can be done this hospitalization. Aspiration left knee requested.  Hypokalemia Potassium replacement ordered.  Debility: Physical therapy consulted.  DVT prophylaxsis switched to SCD's due to thrombocytopenia.  Code Status: Full code      Family Communication:  No family at the bedside at this time.  Discussed the plan of care with his daughter.  Disposition Plan: 2 -3 days  Consultants:  None  Procedures:  None  Antibiotics: Anti-infectives (From admission, onward)   Start     Dose/Rate Route Frequency Ordered  Stop   08/11/17 0900  doxycycline (VIBRAMYCIN) 100 mg in dextrose 5 % 250 mL IVPB     100 mg 125 mL/hr over 120 Minutes Intravenous Every 12 hours 08/11/17 0813     08/11/17 0845  cefTRIAXone (ROCEPHIN) 1 g in dextrose 5 % 50 mL IVPB     1 g 100 mL/hr over 30 Minutes Intravenous Every 24 hours 08/11/17 0813     08/10/17 2000  levofloxacin (LEVAQUIN) IVPB 500 mg  Status:  Discontinued     500 mg 100 mL/hr over 60 Minutes Intravenous Every 24 hours 08/10/17 1927 08/11/17 0803      HPI/Subjective: Appears to be calm and comfortable at this time.  Has sundowning with some agitation at night.  Sitter at bedside.  Lower extremity edema improving.  Does not have any more hallucinations.   Objective: Vitals:   08/14/17 1500 08/14/17 1523 08/15/17 0447 08/15/17 0852  BP:   114/84 (!) 97/45  Pulse:  70 (!) 53 63  Resp:  16 18   Temp:   98.2 F (36.8 C) (!) 97.5 F (36.4 C)  TempSrc:   Oral Axillary  SpO2: 95% 95% 93% 98%  Weight:   81.7 kg (180 lb 1.9 oz)   Height:        Intake/Output Summary (Last 24 hours) at 08/15/2017 1311 Last data filed at 08/15/2017 0447 Gross per 24 hour  Intake 610 ml  Output 700 ml  Net -90 ml    Exam:  Examination:  General exam: Currently calm and comfortable after medication Respiratory system: clear to auscultation. Has trach, Respiratory effort normal. Cardiovascular system: S1 & S2 heard, RRR. No JVD, murmurs, rubs, gallops or clicks. No pedal edema. Gastrointestinal system: Abdomen is nondistended, soft and nontender. No organomegaly or masses felt. Normal bowel sounds heard. Central nervous system: Hard of hearing, able to follow commands, moving all extremities, gait instability, gait not assessed at this time. Extremities: Symmetric 5 x 5 power.  Lower extremity edema improving Skin: Right thigh nonspecific nonblanching rash, scabbing, no open lesions noted at this time Psychiatry: Has sundowning with some agitation at night, stable at this  time   Data Reviewed: I have personally reviewed following labs and imaging studies  Micro Results Recent Results (from the past 240 hour(s))  MRSA PCR Screening     Status: None   Collection Time: 08/11/17  6:14 PM  Result Value Ref Range Status   MRSA by PCR NEGATIVE NEGATIVE Final    Comment:        The GeneXpert MRSA Assay (FDA approved for NASAL specimens only), is one component of a comprehensive MRSA colonization surveillance program. It is not intended to diagnose MRSA infection nor to guide or monitor treatment for MRSA infections.     Radiology Reports Dg Knee 1-2 Views Left  Result Date: 08/14/2017 CLINICAL DATA:  Swelling EXAM: LEFT KNEE - 1-2 VIEW COMPARISON:  None. FINDINGS: There is marked narrowing of the medial, lateral, and patellofemoral compartments associated with osteophytes. Moderate joint effusion is present. There is soft tissue swelling of the anterior aspect of the knee. No acute fracture. There is dense atherosclerotic calcification of the popliteal artery which appears somewhat tortuous or distended.  Popliteal artery aneurysm is not excluded. IMPRESSION: 1. Marked degenerative changes and joint effusion.  No fracture. 2. Extensive atherosclerotic calcification of the popliteal artery. Popliteal artery aneurysm should be considered. This could be further evaluated lower extremity arterial ultrasound. Electronically Signed   By: Nolon Nations M.D.   On: 08/14/2017 19:45   Ct Head Wo Contrast  Result Date: 08/12/2017 CLINICAL DATA:  84 y/o  M; altered mental status.  Hallucinations. EXAM: CT HEAD WITHOUT CONTRAST TECHNIQUE: Contiguous axial images were obtained from the base of the skull through the vertex without intravenous contrast. COMPARISON:  04/06/2014 CT head. FINDINGS: Brain: No evidence of acute infarction, hemorrhage, hydrocephalus, extra-axial collection or mass lesion/mass effect. Stable chronic microvascular ischemic changes and parenchymal  volume loss of the brain. Vascular: Calcific atherosclerosis of carotid siphons. No hyperdense vessel. Skull: Normal. Negative for fracture or focal lesion. Sinuses/Orbits: No acute finding. Other: Bilateral intra-ocular lens replacement. IMPRESSION: 1. No acute intracranial abnormality identified. 2. Stable chronic microvascular ischemic changes and parenchymal volume loss of the brain. Electronically Signed   By: Kristine Garbe M.D.   On: 08/12/2017 21:06   Mr Brain Wo Contrast  Result Date: 08/12/2017 CLINICAL DATA:  84 y/o  M; altered mental status and hallucinations. EXAM: MRI HEAD WITHOUT CONTRAST TECHNIQUE: Axial DWI, coronal DWI, axial T2 FLAIR propeller, axial T2 propeller sequences were acquired. As patient was disoriented additional sequences were not acquired. COMPARISON:  08/12/2017 and 04/06/2014 CT head. FINDINGS: Severe motion degradation of all sequences. No reduced diffusion identified to suggest acute or early subacute infarction. No focal mass effect. Parenchymal volume loss and chronic microvascular ischemic changes of the brain are grossly stable from prior studies. IMPRESSION: Severe motion artifact. No acute/early subacute stroke or mass effect identified. Consider repeat imaging when patient is able to follow commands and hold still. Electronically Signed   By: Kristine Garbe M.D.   On: 08/12/2017 22:37   Dg Chest Port 1 View  Result Date: 08/10/2017 CLINICAL DATA:  Shortness of breath EXAM: PORTABLE CHEST 1 VIEW COMPARISON:  March 24, 2017 FINDINGS: The mediastinal contour is normal. Heart size is enlarged. The aorta is tortuous. There is patchy consolidation of left lung base. Bilateral pleural effusions are noted. There is mild interstitial edema. The visualized skeletal structures are unremarkable. IMPRESSION: Mild congestive heart failure. Patchy consolidation of left lung base, pneumonia is not excluded. Small bilateral pleural effusions. Electronically  Signed   By: Abelardo Diesel M.D.   On: 08/10/2017 17:49     CBC Recent Labs  Lab 08/10/17 2037 08/11/17 0543 08/13/17 1531 08/14/17 0419 08/15/17 0407  WBC 7.0 7.3 8.0 9.8 7.9  HGB 15.3 15.6 15.8 16.0 15.1  HCT 45.1 47.0 48.8 49.3 47.1  PLT 129* 128* 135* 148* 127*  MCV 97.8 97.5 97.8 98.8 98.1  MCH 33.2 32.4 31.7 32.1 31.5  MCHC 33.9 33.2 32.4 32.5 32.1  RDW 15.4 15.4 15.0 15.0 15.0  LYMPHSABS  --  1.1  --   --   --   MONOABS  --  0.5  --   --   --   EOSABS  --  0.1  --   --   --   BASOSABS  --  0.0  --   --   --     Chemistries  Recent Labs  Lab 08/10/17 2037 08/11/17 0543 08/12/17 0428 08/13/17 0520 08/14/17 0419 08/15/17 0407  NA  --  139 141 140 139 137  K  --  3.8 3.1*  3.0* 4.3 3.4*  CL  --  108 111 103 101 99*  CO2  --  22 21* 27 25 28   GLUCOSE  --  87 90 92 89 97  BUN  --  34* 32* 28* 29* 26*  CREATININE 1.53* 1.53* 1.40* 1.38* 1.44* 1.38*  CALCIUM  --  9.0 8.4* 8.5* 8.9 8.6*  AST 23  --   --   --   --   --   ALT 15*  --   --   --   --   --   ALKPHOS 93  --   --   --   --   --   BILITOT 1.4*  --   --   --   --   --    ------------------------------------------------------------------------------------------------------------------ estimated creatinine clearance is 37.9 mL/min (A) (by C-G formula based on SCr of 1.38 mg/dL (H)). ------------------------------------------------------------------------------------------------------------------ No results for input(s): HGBA1C in the last 72 hours. ------------------------------------------------------------------------------------------------------------------ No results for input(s): CHOL, HDL, LDLCALC, TRIG, CHOLHDL, LDLDIRECT in the last 72 hours. ------------------------------------------------------------------------------------------------------------------ No results for input(s): TSH, T4TOTAL, T3FREE, THYROIDAB in the last 72 hours.  Invalid input(s):  FREET3 ------------------------------------------------------------------------------------------------------------------ No results for input(s): VITAMINB12, FOLATE, FERRITIN, TIBC, IRON, RETICCTPCT in the last 72 hours.  Coagulation profile No results for input(s): INR, PROTIME in the last 168 hours.  No results for input(s): DDIMER in the last 72 hours.  Cardiac Enzymes Recent Labs  Lab 08/10/17 1610  TROPONINI 0.04*   ------------------------------------------------------------------------------------------------------------------ Invalid input(s): POCBNP   CBG: Recent Labs  Lab 08/11/17 2121  GLUCAP 124*    Studies: Dg Knee 1-2 Views Left  Result Date: 08/14/2017 CLINICAL DATA:  Swelling EXAM: LEFT KNEE - 1-2 VIEW COMPARISON:  None. FINDINGS: There is marked narrowing of the medial, lateral, and patellofemoral compartments associated with osteophytes. Moderate joint effusion is present. There is soft tissue swelling of the anterior aspect of the knee. No acute fracture. There is dense atherosclerotic calcification of the popliteal artery which appears somewhat tortuous or distended. Popliteal artery aneurysm is not excluded. IMPRESSION: 1. Marked degenerative changes and joint effusion.  No fracture. 2. Extensive atherosclerotic calcification of the popliteal artery. Popliteal artery aneurysm should be considered. This could be further evaluated lower extremity arterial ultrasound. Electronically Signed   By: Nolon Nations M.D.   On: 08/14/2017 19:45      Lab Results  Component Value Date   HGBA1C 5.5 08/11/2017   HGBA1C  09-13-202010    5.3 (NOTE) The ADA recommends the following therapeutic goal for glycemic control related to Hgb A1c measurement: Goal of therapy: <6.5 Hgb A1c  Reference: American Diabetes Association: Clinical Practice Recommendations 2010, Diabetes Care, 2010, 33: (Suppl  1).   Lab Results  Component Value Date   LDLCALC 77 12/21/2016   CREATININE  1.38 (H) 08/15/2017    Scheduled Meds: . aspirin EC  81 mg Oral Daily  . atorvastatin  40 mg Oral Daily  . carvedilol  3.125 mg Oral BID WC  . furosemide  20 mg Intravenous Q12H  . lisinopril  2.5 mg Oral Daily  . potassium chloride  20 mEq Oral Daily  . sodium chloride flush  3 mL Intravenous Q12H  . temazepam  15 mg Oral QHS   Continuous Infusions: . sodium chloride    . cefTRIAXone (ROCEPHIN)  IV Stopped (08/15/17 0915)  . doxycycline (VIBRAMYCIN) IV 100 mg (08/15/17 1032)     LOS: 5 days    Yaakov Guthrie, MD  [facility] Pager  [phone]. If 7PM-7AM, please contact night-coverage at [email or web address], password [facility] 08/15/2017, 1:11 PM  LOS: 5 days

## 2017-08-15 NOTE — Plan of Care (Signed)
All patient's questions are answered via dry erase board due to patient's inability to talk. Will continue to monitor for patient needs.

## 2017-08-15 NOTE — Consult Note (Signed)
Cardiology Consultation:   Patient ID: Timothy Suarez; [id number]; Jun 25, 1925   Admit date: 08/10/2017 Date of Consult: 08/15/2017  Primary Care Provider: Gayland Curry, DO Primary Cardiologist: Fransico Him, MD  Primary Electrophysiologist:  n/a   Patient Profile:   Timothy Suarez is a 84 y.o. male with a hx of chronic systolic HF who is being seen today for the evaluation of acute on chronic systolic HF at the request of Dr Barth Kirks.  History of Present Illness:   Mr. Schroepfer 84 yo male history of PAF, MVP with severe MR s/p repair, HTN, nonobstructive CAD, colon cancer, chronic systolic HF LVEF 42-70% by echo this admit, admitted with AMS and LE edema.From notes medical therapy for CHF has been limited by soft bp's.   Family reports main reason brought is is altered mental status. They also report over that time significant leg swelling and some SOB. Patient has not reported any chest pain. From report he has been compliant with meds.     Past Medical History:  Diagnosis Date  . A-fib ([place])   . Aortic valve insufficiency, acquired   . Arthritis   . BPH (benign prostatic hyperplasia)   . Cerebral ischemia   . Colon cancer [facility])     s/p partial colectomy  . Complication of anesthesia    "woke up w/confusion and hallucinations once after mitral valve OR"  . Coronary artery disease    50-70% LAD mitral valve repair 4+ yrs ago  . Depression   . Dyslipidemia   . Glaucoma   . Heart murmur   . Hernia   . High cholesterol   . History of echocardiogram    Echo 5/16:  Mild LVH, EF 50-55%, Gr 1 DD, septal HK, Ao sclerosis without stenosis, mild AI, MV repair ok with borderline mild MS (mean 4 mmHg), mild MR, mild LAE, mod RAE, mild TR, PASP 30 mmHg  . HTN (hypertension)    takes Amlodipine daily  . Hx of cardiovascular stress test    Lexiscan Myoview 5/16:  Apical thinning, EF not gated, no ischemia. Low Risk  . Hyperlipidemia   . Major depression   . Mild cognitive  impairment   . MVP (mitral valve prolapse)    S/P Rt mini thoractomy for Mitral Valve repair  . Nocturia   . Paroxysmal atrial fibrillation (HCC)    S/P Maze procedure  . 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 Prostate cancer ([place])   . 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 Shortness of breath dyspnea    with exertion  . Sleep disturbance   . Thoracic aortic aneurysm ([place])   . Urinary frequency   . Urinary urgency     Past Surgical History:  Procedure Laterality Date  . CARDIAC CATHETERIZATION    . CATARACT EXTRACTION W/ INTRAOCULAR LENS  IMPLANT, BILATERAL Bilateral   . COLECTOMY  2004   Dr [name]  . DIRECT LARYNGOSCOPY N/A 01/15/2016   Procedure: DIRECT LARYNGOSCOPY WITH BIOPSY AND FROZEN SECTION;  Surgeon: Izora Gala, MD;  Location: [place];  Service: ENT;  Laterality: N/A;  . FlexHD patch repair of chest wall hernia.  01/28/2011   [name]  . GASTROSTOMY W/ FEEDING TUBE    . IR GENERIC HISTORICAL  10/08/2016   IR GASTROSTOMY TUBE REMOVAL 10/08/2016 Sandi Mariscal, MD [facility]-INTERV RAD  . JOINT REPLACEMENT    . LARYNGETOMY N/A 01/15/2016   Procedure:  TOTAL  LARYNGECTOMY;  Surgeon: Izora Gala, MD;  Location: [place];  Service: ENT;  Laterality: N/A;  . MAZE  12/27/2008   left side lesion set  . MICROLARYNGOSCOPY Right 01/29/2015   Procedure: MICROLARYNGOSCOPY WITH BIOSPY OF RIGHT VOCAL CORD;  Surgeon: Izora Gala, MD;  Location: [place];  Service: ENT;  Laterality: Right;  . MITRAL VALVE REPAIR  12/27/2008   complex valvuloplasty with 65mm Memo 3D annuloplasty ring via right minithoracotomy  . PECTORALIS FLAP Left 05/04/2016   Procedure: PECTORALIS FLAP to neck with possible, split thickness skin graft;  Surgeon: Loel Lofty Dillingham, DO;  Location: [place];  Service: Plastics;  Laterality: Left;  . PROSTATE BIOPSY    . SKIN SPLIT GRAFT Left 05/04/2016   Procedure: PECTORALIS MAJOR MYOCUTANEOUS FLAP RECONSTRUCTION OF PHARYNX AND  SPLIT THICKNESS SKIN GRAFT;  Surgeon: Izora Gala, MD;  Location: [place];  Service: ENT;  Laterality: Left;  . TEE WITHOUT CARDIOVERSION N/A 01/03/2013   Procedure: TRANSESOPHAGEAL ECHOCARDIOGRAM (TEE);  Surgeon: Sueanne Margarita, MD;  Location: [place];  Service: Cardiovascular;  Laterality: N/A;  . TOTAL KNEE ARTHROPLASTY Right 2003  . TRACHEAL ESOPHAGEAL PUNCTURE REPAIR N/A 03/26/2016   Procedure: TRACHEAL ESOPHAGEAL PUNCTURE;  Surgeon: Izora Gala, MD;  Location[facility] OR;  Service: ENT;  Laterality: N/A;  . TRACHEOESOPHAGEAL FISTULA REPAIR N/A 03/26/2016   Procedure: TRACHEO-ESOPHAGEAL   PUNCTURE,FISTULAR CLOSURE;  Surgeon: Izora Gala, MD;  Location: [place];  Service: ENT;  Laterality: N/A;  . TRACHEOESOPHAGEAL FISTULA REPAIR N/A 04/09/2016   Procedure: FISTULA REPAIR WITH  MUSCLE ROTATION FLAP;  Surgeon: Izora Gala, MD;  Location: [place];  Service: ENT;  Laterality: N/A;  . TRACHEOESOPHAGEAL FISTULA REPAIR N/A 07/27/2016   Procedure: CLOSURE OF FISTULA;  Surgeon: Izora Gala, MD;  Location: [place];  Service: ENT;  Laterality: N/A;  . TYMPANOPLASTY  1967   "? side"       Inpatient Medications: Scheduled Meds: . aspirin EC  81 mg Oral Daily  . atorvastatin  40 mg Oral Daily  . carvedilol  3.125 mg Oral BID WC  . furosemide  20 mg Intravenous Q12H  . lisinopril  2.5 mg Oral Daily  . potassium chloride  20 mEq Oral Daily  . sodium chloride flush  3 mL Intravenous Q12H  . temazepam  15 mg Oral QHS   Continuous Infusions: . sodium chloride     PRN Meds: sodium chloride, acetaminophen, sodium chloride flush, traZODone  Allergies:    Allergies  Allergen Reactions  . Ambien [Zolpidem] Other (See Comments)    Causes confusion  . Citalopram Nausea Only  . Trazodone And Nefazodone Other (See Comments)    Dry mouth  . Zoloft [Sertraline Hcl] Other (See Comments)    dizzy    Social History:   Social History   Socioeconomic History  . Marital status: Widowed    Spouse name: Not  on file  . Number of children: 2  . Years of education: 9  . Highest education level: Not on file  Social Needs  . Financial resource strain: Not on file  . Food insecurity - worry: Not on file  . Food insecurity - inability: Not on file  . Transportation needs - medical: Not on file  . Transportation needs - non-medical: Not on file  Occupational History  . Occupation: retired Nutritional therapist   Tobacco Use  . Smoking status: Never Smoker  . Smokeless tobacco: Never Used  Substance and Sexual Activity  . Alcohol use: Yes  Alcohol/week: 0.0 oz    Comment: rare  . Drug use: No  . Sexual activity: No  Other Topics Concern  . Not on file  Social History Narrative   Widowed   Never smoked   Alcohol  Occasionally    Exercise - walking the [employer], retail with cane   [place], HCPOA             Family History:    Family History  Problem Relation Age of Onset  . Cancer Mother        lymphoma  . Cancer Father        pancreatic  . Heart attack Neg Hx   . Stroke Neg Hx      ROS:  Please see the history of present illness.  ROS  All other ROS reviewed and negative.     Physical Exam/Data:   Vitals:   08/14/17 1523 08/15/17 0447 08/15/17 0852 08/15/17 1409  BP:  114/84 (!) 97/45 (!) 94/49  Pulse: 70 (!) 53 63 (!) 58  Resp: 16 18    Temp:  98.2 F (36.8 C) (!) 97.5 F (36.4 C) (!) 97.5 F (36.4 C)  TempSrc:  Oral Axillary Axillary  SpO2: 95% 93% 98% 97%  Weight:  180 lb 1.9 oz (81.7 kg)    Height:        Intake/Output Summary (Last 24 hours) at 08/15/2017 1455 Last data filed at 08/15/2017 0447 Gross per 24 hour  Intake 610 ml  Output 700 ml  Net -90 ml   Filed Weights   08/13/17 0447 08/14/17 0556 08/15/17 0447  Weight: 172 lb 13.5 oz (78.4 kg) 177 lb 11.1 oz (80.6 kg) 180 lb 1.9 oz (81.7 kg)   Body mass index is 25.12 kg/m.  General:  Well nourished, well developed, in no acute distress HEENT: normal Lymph: no adenopathy Neck: no  JVD Endocrine:  No thryomegaly Cardiac:  RRR, 2/6 systolic murmur at apex Lungs:  clear to auscultation bilaterally, no wheezing, rhonchi or rales  Abd: soft, nontender, no hepatomegaly  Ext: no edema Musculoskeletal:  No deformities, BUE and BLE strength normal and equal Skin: warm and dry  Neuro:  CNs 2-12 intact, no focal abnormalities noted Psych:  Normal affect     Laboratory Data:  Chemistry Recent Labs  Lab 08/13/17 0520 08/14/17 0419 08/15/17 0407  NA 140 139 137  K 3.0* 4.3 3.4*  CL 103 101 99*  CO2 27 25 28   GLUCOSE 92 89 97  BUN 28* 29* 26*  CREATININE 1.38* 1.44* 1.38*  CALCIUM 8.5* 8.9 8.6*  GFRNONAA 43* 41* 43*  GFRAA 50* 48* 50*  ANIONGAP 10 13 10     Recent Labs  Lab 08/10/17 2037  PROT 6.4*  ALBUMIN 3.4*  AST 23  ALT 15*  ALKPHOS 93  BILITOT 1.4*   Hematology Recent Labs  Lab 08/13/17 1531 08/14/17 0419 08/15/17 0407  WBC 8.0 9.8 7.9  RBC 4.99 4.99 4.80  HGB 15.8 16.0 15.1  HCT 48.8 49.3 47.1  MCV 97.8 98.8 98.1  MCH 31.7 32.1 31.5  MCHC 32.4 32.5 32.1  RDW 15.0 15.0 15.0  PLT 135* 148* 127*   Cardiac Enzymes Recent Labs  Lab 08/10/17 1610  TROPONINI 0.04*   No results for input(s): TROPIPOC in the last 168 hours.  BNP Recent Labs  Lab 08/10/17 2037  BNP 2,687.6*    DDimer No results for input(s): DDIMER in the last  168 hours.  Radiology/Studies:  Dg Knee 1-2 Views Left  Result Date: 08/14/2017 CLINICAL DATA:  Swelling EXAM: LEFT KNEE - 1-2 VIEW COMPARISON:  None. FINDINGS: There is marked narrowing of the medial, lateral, and patellofemoral compartments associated with osteophytes. Moderate joint effusion is present. There is soft tissue swelling of the anterior aspect of the knee. No acute fracture. There is dense atherosclerotic calcification of the popliteal artery which appears somewhat tortuous or distended. Popliteal artery aneurysm is not excluded. IMPRESSION: 1. Marked degenerative changes and joint effusion.  No  fracture. 2. Extensive atherosclerotic calcification of the popliteal artery. Popliteal artery aneurysm should be considered. This could be further evaluated lower extremity arterial ultrasound. Electronically Signed   By: Nolon Nations M.D.   On: 08/14/2017 19:45   Ct Head Wo Contrast  Result Date: 08/12/2017 CLINICAL DATA:  84 y/o  M; altered mental status.  Hallucinations. EXAM: CT HEAD WITHOUT CONTRAST TECHNIQUE: Contiguous axial images were obtained from the base of the skull through the vertex without intravenous contrast. COMPARISON:  04/06/2014 CT head. FINDINGS: Brain: No evidence of acute infarction, hemorrhage, hydrocephalus, extra-axial collection or mass lesion/mass effect. Stable chronic microvascular ischemic changes and parenchymal volume loss of the brain. Vascular: Calcific atherosclerosis of carotid siphons. No hyperdense vessel. Skull: Normal. Negative for fracture or focal lesion. Sinuses/Orbits: No acute finding. Other: Bilateral intra-ocular lens replacement. IMPRESSION: 1. No acute intracranial abnormality identified. 2. Stable chronic microvascular ischemic changes and parenchymal volume loss of the brain. Electronically Signed   By: Kristine Garbe M.D.   On: 08/12/2017 21:06   Mr Brain Wo Contrast  Result Date: 08/12/2017 CLINICAL DATA:  84 y/o  M; altered mental status and hallucinations. EXAM: MRI HEAD WITHOUT CONTRAST TECHNIQUE: Axial DWI, coronal DWI, axial T2 FLAIR propeller, axial T2 propeller sequences were acquired. As patient was disoriented additional sequences were not acquired. COMPARISON:  08/12/2017 and 04/06/2014 CT head. FINDINGS: Severe motion degradation of all sequences. No reduced diffusion identified to suggest acute or early subacute infarction. No focal mass effect. Parenchymal volume loss and chronic microvascular ischemic changes of the brain are grossly stable from prior studies. IMPRESSION: Severe motion artifact. No acute/early subacute stroke  or mass effect identified. Consider repeat imaging when patient is able to follow commands and hold still. Electronically Signed   By: Kristine Garbe M.D.   On: 08/12/2017 22:37    Assessment and Plan:   1. Chronic systolic HF - admitted with volume overload. Negative 682mL yesterday, negative 3.8 liters since admission. He is on lasix IV 20mg  bid, renal function stable.  - has had difficulty tolerating CHF meds, had not been on. Trial this admit of low dose coreg and lisinopril. Soft bp's at times, bradycardia today. I don't believe he will tolerate medical therapy. With his brady stop coreg, continue lisinopril for now and monitor - still with some volume overload,continue IV lasix today.  - LVEF drop between 12/2016 during admission with CHF, does not appear cardiology saw at that time. I don't see where an ischemic evaluation has been considered, mainly due to limited f/u. Family reports prior to this admission with AMS patient lived in independent living. Was ambulatory, fed himself, dressed himself, could keep a conversation. If he returns to his baseline an ischemic evaluation is at least something to discuss though I am not sure of his candidacy.   2. Bradycardia - heart rate to mid 40s at times, [place] block, do not see any higher grade block - I don't believe  he will tolerate beta blocker due to low heart rates and low bp's. We will d/c coreg. High risk of side effects than long term benefit given his advanced age and morbidities  3. Afib - s/p maze procedure w/o recurrence - no recent tachycardia, has not required anticoag   For questions or updates, please contact [place] Please consult [email or web address] for contact info under Cardiology/STEMI.   Merrily Pew, MD  08/15/2017 2:55 PM

## 2017-08-15 NOTE — Progress Notes (Signed)
Patient had 8 beats of widen ORS complexes but remained asymptomatic. Notified on call  NP/PA [place]

## 2017-08-15 NOTE — Progress Notes (Signed)
CCMD called HR drop down into 30 but sustaining in 40's and low 50's.  Paged doctor to let know.

## 2017-08-16 ENCOUNTER — Inpatient Hospital Stay (HOSPITAL_COMMUNITY): Payer: Medicare Other

## 2017-08-16 ENCOUNTER — Encounter (HOSPITAL_COMMUNITY): Payer: Self-pay | Admitting: Cardiology

## 2017-08-16 DIAGNOSIS — I509 Heart failure, unspecified: Secondary | ICD-10-CM

## 2017-08-16 DIAGNOSIS — J392 Other diseases of pharynx: Secondary | ICD-10-CM

## 2017-08-16 DIAGNOSIS — G3184 Mild cognitive impairment, so stated: Secondary | ICD-10-CM

## 2017-08-16 LAB — BASIC METABOLIC PANEL
ANION GAP: 10 (ref 5–15)
BUN: 32 mg/dL — ABNORMAL HIGH (ref 6–20)
CALCIUM: 8.2 mg/dL — AB (ref 8.9–10.3)
CO2: 26 mmol/L (ref 22–32)
Chloride: 106 mmol/L (ref 101–111)
Creatinine, Ser: 1.35 mg/dL — ABNORMAL HIGH (ref 0.61–1.24)
GFR, EST AFRICAN AMERICAN: 52 mL/min — AB (ref >60)
GFR, EST NON AFRICAN AMERICAN: 45 mL/min — AB (ref >60)
GLUCOSE: 84 mg/dL (ref 65–99)
POTASSIUM: 3.5 mmol/L (ref 3.5–5.1)
Sodium: 142 mmol/L (ref 135–145)

## 2017-08-16 LAB — GLUCOSE, CAPILLARY
GLUCOSE-CAPILLARY: 107 mg/dL — AB (ref 65–99)
GLUCOSE-CAPILLARY: 63 mg/dL — AB (ref 65–99)
GLUCOSE-CAPILLARY: 67 mg/dL (ref 65–99)
GLUCOSE-CAPILLARY: 76 mg/dL (ref 65–99)
GLUCOSE-CAPILLARY: 78 mg/dL (ref 65–99)
GLUCOSE-CAPILLARY: 85 mg/dL (ref 65–99)
Glucose-Capillary: 70 mg/dL (ref 65–99)

## 2017-08-16 LAB — CBC
HCT: 43.9 % (ref 39.0–52.0)
Hemoglobin: 14.3 g/dL (ref 13.0–17.0)
MCH: 32.4 pg (ref 26.0–34.0)
MCHC: 32.6 g/dL (ref 30.0–36.0)
MCV: 99.5 fL (ref 78.0–100.0)
PLATELETS: 109 10*3/uL — AB (ref 150–400)
RBC: 4.41 MIL/uL (ref 4.22–5.81)
RDW: 15.3 % (ref 11.5–15.5)
WBC: 6.8 10*3/uL (ref 4.0–10.5)

## 2017-08-16 MED ORDER — SENNA 8.6 MG PO TABS
1.0000 | ORAL_TABLET | Freq: Every day | ORAL | Status: DC
  Administered 2017-08-16 – 2017-08-19 (×4): 8.6 mg via ORAL
  Filled 2017-08-16 (×4): qty 1

## 2017-08-16 MED ORDER — METHYLPREDNISOLONE ACETATE 40 MG/ML INJ SUSP (RADIOLOG
120.0000 mg | Freq: Once | INTRAMUSCULAR | Status: AC
  Administered 2017-08-17: 80 mg via INTRA_ARTICULAR
  Filled 2017-08-16: qty 3

## 2017-08-16 MED ORDER — LIDOCAINE HCL (PF) 1 % IJ SOLN
5.0000 mL | Freq: Once | INTRAMUSCULAR | Status: DC
  Filled 2017-08-16: qty 5

## 2017-08-16 MED ORDER — POLYETHYLENE GLYCOL 3350 17 G PO PACK
17.0000 g | PACK | Freq: Every day | ORAL | Status: DC
  Administered 2017-08-16 – 2017-08-19 (×4): 17 g via ORAL
  Filled 2017-08-16 (×4): qty 1

## 2017-08-16 NOTE — Progress Notes (Signed)
Sitter from 7am-3pm failed to document output from condom cath; therefore, output is inaccurate.

## 2017-08-16 NOTE — Progress Notes (Addendum)
Modified orders from bedside safety sitter to telemetry sitter, as pt is still confused, trying to exit bed, but is now following commands.  Called [phone] to set up telemonitor with service. CN aware.

## 2017-08-16 NOTE — Progress Notes (Signed)
Occupational Therapy Treatment Patient Details Name: Timothy Suarez MRN: [id number] DOB: 04-16-1925 Today's Date: 08/16/2017    History of present illness 84yo male with B LE edema, hallucinations, orthopnea, PND with old trach site which per MD note patient had been independent in self-suctioning at home. Dx with acute on chronic CHF, hallucinations. PMH A-fib, aortic valve insufficiency, cerbral ischemia, hx colon CA, CAD, HTN, PAF, thoracic AAA, hx cardiac cath, hx joint replacements, vocal cord CA, hx multiple tracheoesophageal surgeries    OT comments  Pt making progress with functional goals, very pleasant and cooperative. Sitter present and assisted with mobility with pt using RW. OT will continue to follow acutely  Follow Up Recommendations  SNF;Supervision/Assistance - 24 hour    Equipment Recommendations  Other (comment)(TBD at next venue of care)    Recommendations for Other Services      Precautions / Restrictions Precautions Precautions: Fall Restrictions Weight Bearing Restrictions: No       Mobility Bed Mobility Overal bed mobility: Needs Assistance Bed Mobility: Supine to Sit;Sit to Supine     Supine to sit: Min guard Sit to supine: Min guard      Transfers Overall transfer level: Needs assistance Equipment used: Rolling walker (2 wheeled) Transfers: Sit to/from Stand Sit to Stand: Min assist              Balance Overall balance assessment: Needs assistance Sitting-balance support: Bilateral upper extremity supported;Feet supported Sitting balance-Leahy Scale: Good     Standing balance support: Bilateral upper extremity supported;During functional activity Standing balance-Leahy Scale: Fair                             ADL either performed or assessed with clinical judgement   ADL Overall ADL's : Needs assistance/impaired                                             Vision Patient Visual Report: No change  from baseline     Perception     Praxis      Cognition Arousal/Alertness: Awake/alert Behavior During Therapy: WFL for tasks assessed/performed Overall Cognitive Status: Within Functional Limits for tasks assessed                                          Exercises     Shoulder Instructions       General Comments      Pertinent Vitals/ Pain       Pain Assessment: No/denies pain Faces Pain Scale: No hurt Pain Intervention(s): Monitored during session  Home Living                                          Prior Functioning/Environment              Frequency  Min 2X/week        Progress Toward Goals  OT Goals(current goals can now be found in the care plan section)  Progress towards OT goals: Progressing toward goals     Plan Discharge plan remains appropriate    Co-evaluation  AM-PAC PT "6 Clicks" Daily Activity     Outcome Measure   Help from another person eating meals?: A Little Help from another person taking care of personal grooming?: A Little Help from another person toileting, which includes using toliet, bedpan, or urinal?: A Lot Help from another person bathing (including washing, rinsing, drying)?: A Lot Help from another person to put on and taking off regular upper body clothing?: A Little Help from another person to put on and taking off regular lower body clothing?: A Lot 6 Click Score: 15    End of Session Equipment Utilized During Treatment: Gait belt;Rolling walker;Other (comment)(BSC)  OT Visit Diagnosis: Unsteadiness on feet (R26.81);Muscle weakness (generalized) (M62.81);Other symptoms and signs involving cognitive function   Activity Tolerance Patient tolerated treatment well   Patient Left with call bell/phone within reach;with nursing/sitter in room;in bed;with bed alarm set   Nurse Communication      Functional Assessment Tool Used: AM-PAC 6 Clicks Daily Activity    Time: 0037-0488 OT Time Calculation (min): 39 min  Charges: OT G-codes **NOT FOR INPATIENT CLASS** Functional Assessment Tool Used: AM-PAC 6 Clicks Daily Activity OT General Charges $OT Visit: 1 Visit OT Treatments $Self Care/Home Management : 8-22 mins $Therapeutic Activity: 23-37 mins     Britt Bottom 08/16/2017, 2:18 PM

## 2017-08-16 NOTE — Progress Notes (Signed)
Tech asked patient if he would like to be washed up and he stated that he did not want to, through writing it out. Tech asked again and he still said no.

## 2017-08-16 NOTE — Evaluation (Signed)
Clinical/Bedside Swallow Evaluation Patient Details  Name: Timothy Suarez MRN: [id number] Date of Birth: 11-10-1924  Today's Date: 08/16/2017 Time: SLP Start Time (ACUTE ONLY): 1500 SLP Stop Time (ACUTE ONLY): 1520 SLP Time Calculation (min) (ACUTE ONLY): 20 min  Past Medical History:  Past Medical History:  Diagnosis Date  . A-fib ([place])   . Aortic valve insufficiency, acquired   . Arthritis   . BPH (benign prostatic hyperplasia)   . Cerebral ischemia   . Colon cancer [facility])     s/p partial colectomy  . Complication of anesthesia    "woke up w/confusion and hallucinations once after mitral valve OR"  . Coronary artery disease    50-70% LAD mitral valve repair 4+ yrs ago  . Dyslipidemia   . Glaucoma   . Hernia   . High cholesterol   . HTN (hypertension)    takes Amlodipine daily  . Hyperlipidemia   . Major depression   . Mild cognitive impairment   . MVP (mitral valve prolapse)    S/P Rt mini thoractomy for Mitral Valve repair  . Paroxysmal atrial fibrillation (HCC)    S/P Maze procedure  . 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 Prostate cancer ([place])   . 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 Sleep disturbance   . Thoracic aortic aneurysm [facility])    Past Surgical History:  Past Surgical History:  Procedure Laterality Date  . CATARACT EXTRACTION W/ INTRAOCULAR LENS  IMPLANT, BILATERAL Bilateral   . COLECTOMY  2004   Dr [name]  . DIRECT LARYNGOSCOPY N/A 01/15/2016   Procedure: DIRECT LARYNGOSCOPY WITH BIOPSY AND FROZEN SECTION;  Surgeon: Izora Gala, MD;  Location: [place];  Service: ENT;  Laterality: N/A;  . FlexHD patch repair of chest wall hernia.  01/28/2011   [name]  . GASTROSTOMY W/ FEEDING TUBE    . IR GENERIC HISTORICAL  10/08/2016   IR GASTROSTOMY TUBE REMOVAL 10/08/2016 Sandi Mariscal, MD [facility]-INTERV RAD  . JOINT REPLACEMENT    . LARYNGETOMY N/A 01/15/2016   Procedure:  TOTAL LARYNGECTOMY;  Surgeon:  Izora Gala, MD;  Location[facility];  Service: ENT;  Laterality: N/A;  . MAZE  12/27/2008   left side lesion set  . MICROLARYNGOSCOPY Right 01/29/2015   Procedure: MICROLARYNGOSCOPY WITH BIOSPY OF RIGHT VOCAL CORD;  Surgeon: Izora Gala, MD;  Location: [place];  Service: ENT;  Laterality: Right;  . MITRAL VALVE REPAIR  12/27/2008   complex valvuloplasty with 39mm Memo 3D annuloplasty ring via right minithoracotomy  . PECTORALIS FLAP Left 05/04/2016   Procedure: PECTORALIS FLAP to neck with possible, split thickness skin graft;  Surgeon: Loel Lofty Dillingham, DO;  Location: [place];  Service: Plastics;  Laterality: Left;  . PROSTATE BIOPSY    . SKIN SPLIT GRAFT Left 05/04/2016   Procedure: PECTORALIS MAJOR MYOCUTANEOUS FLAP RECONSTRUCTION OF PHARYNX AND SPLIT THICKNESS SKIN GRAFT;  Surgeon: Izora Gala, MD;  Location: [place];  Service: ENT;  Laterality: Left;  . TEE WITHOUT CARDIOVERSION N/A 01/03/2013   Procedure: TRANSESOPHAGEAL ECHOCARDIOGRAM (TEE);  Surgeon: Sueanne Margarita, MD;  Location: [place];  Service: Cardiovascular;  Laterality: N/A;  . TOTAL KNEE ARTHROPLASTY Right 2003  . TRACHEAL ESOPHAGEAL PUNCTURE REPAIR N/A 03/26/2016   Procedure: TRACHEAL ESOPHAGEAL PUNCTURE;  Surgeon: Izora Gala, MD;  Location: [place];  Service: ENT;  Laterality: N/A;  . TRACHEOESOPHAGEAL FISTULA REPAIR N/A 03/26/2016   Procedure: TRACHEO-ESOPHAGEAL   PUNCTURE,FISTULAR CLOSURE;  Surgeon: Izora Gala, MD;  Location: [place];  Service: ENT;  Laterality: N/A;  . TRACHEOESOPHAGEAL FISTULA REPAIR N/A 04/09/2016   Procedure: FISTULA REPAIR WITH  MUSCLE ROTATION FLAP;  Surgeon: Izora Gala, MD;  Location: [place];  Service: ENT;  Laterality: N/A;  . TRACHEOESOPHAGEAL FISTULA REPAIR N/A 07/27/2016   Procedure: CLOSURE OF FISTULA;  Surgeon: Izora Gala, MD;  Location: [place];  Service: ENT;  Laterality: N/A;  . TYMPANOPLASTY  1967   "? side"   HPI:  84 y.o.malewith medical history significant ofsystolic dysfunction CHF with  last EF of25% here with Progressive lower extremity edema as well as hallucinations. He has been on Lasix 40 mg daily at home. Patient presented with signs and symptoms of CHF exacerbation. Pt has a history of laryngeal cancer (right vocal cord) and total laryngectomy 01/15/16, PEG tube placement, removed 10/08/16. TEP 8/17. 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 Gretta Began 04/2016 and 07/2016.     Assessment / Plan / Recommendation Clinical Impression  Pt demonstrates adequate toelrance of graham crackers and thin liquids. Daughter and sitter report that pt has had some regurgitation of PO with meals. Given no possiblity of aspiration s/p laryngectomy (as long as no fistula is present, his was repaired in 2017) highly suspect a mild esophageal dysphagia.  Offered basic strategies to facilitate esopahgeal clearance including slowing rate of intake and following bites with sips. Pt is quite impulsive/rapid with intake and daughter confirms this. Will f/u x1 at a meal to determine if diet texture modification or esophageal assessment is warranted.  SLP Visit Diagnosis: Dysphagia, unspecified (R13.10)    Aspiration Risk  No limitations    Diet Recommendation Regular;Thin liquid   Liquid Administration via: Cup;Straw Medication Administration: Whole meds with liquid Supervision: Patient able to self feed Compensations: Slow rate;Small sips/bites;Follow solids with liquid;Minimize environmental distractions Postural Changes: Seated upright at 90 degrees;Remain upright for at least 30 minutes after po intake    Other  Recommendations Oral Care Recommendations: Oral care BID   Follow up Recommendations Skilled Nursing facility      Frequency and Duration min 1 x/week  1 week       Prognosis Prognosis for Safe Diet Advancement: Good      Swallow Study   General HPI: 84 y.o.malewith medical history significant ofsystolic dysfunction CHF with last EF  of25% here with Progressive lower extremity edema as well as hallucinations. He has been on Lasix 40 mg daily at home. Patient presented with signs and symptoms of CHF exacerbation. Pt has a history of laryngeal cancer (right vocal cord) and total laryngectomy 01/15/16, PEG tube placement, removed 10/08/16. TEP 8/17. 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 Gretta Began 04/2016 and 07/2016.   Type of Study: Bedside Swallow Evaluation Diet Prior to this Study: Regular;Thin liquids Temperature Spikes Noted: No Respiratory Status: Room air History of Recent Intubation: No Behavior/Cognition: Alert;Cooperative;Pleasant mood Oral Cavity Assessment: Within Functional Limits Oral Care Completed by SLP: No Oral Cavity - Dentition: Adequate natural dentition Vision: Functional for self-feeding Self-Feeding Abilities: Able to feed self Patient Positioning: Upright in bed Baseline Vocal Quality: Aphonic(laryngectomy) Volitional Cough: Strong Volitional Swallow: Able to elicit    Oral/Motor/Sensory Function Overall Oral Motor/Sensory Function: Within functional limits   Ice Chips     Thin Liquid Thin Liquid: Within functional limits Presentation: Self Fed    Nectar Thick Nectar Thick Liquid: Not tested   Honey Thick Honey Thick Liquid: Not tested  Puree Puree: Not tested   Solid   GO   Solid: Within functional limits Presentation: [place] Amerie Beaumont, MA CCC-SLP [phone]  Lynann Beaver 08/16/2017,3:33 PM

## 2017-08-16 NOTE — Plan of Care (Signed)
  Education: Knowledge of General Education information will improve 08/16/2017 0737 - Not Progressing by Imagene Gurney, RN

## 2017-08-16 NOTE — Clinical Social Work Note (Signed)
CSW called patient's daughter to notify her that [facility] and [facility] have both declined extending bed offers. She would like to accept [facility] bed offer. Admissions coordinator notified. Patient has to be without a sitter for 24 hours prior to discharge to SNF.  Dayton Scrape, [place]

## 2017-08-16 NOTE — Progress Notes (Addendum)
Notified Dr. Daleen Bo of abd xray (08/15/17) resulted "colonic stool burden."  Requested orders for constipation.  Also, pt c/o N/V.  Will give ordered Zofran.  Waiting for orders.  ------------------------------------------------------------------------- Orders received for senokot and miralax.

## 2017-08-16 NOTE — Progress Notes (Signed)
Progress Note  Patient Name: Timothy Suarez Date of Encounter: 08/16/2017  Primary Cardiologist: Fransico Him, MD   Subjective   Pt denies dyspnea or chest discomfort.    Inpatient Medications    Scheduled Meds: . aspirin EC  81 mg Oral Daily  . atorvastatin  40 mg Oral Daily  . furosemide  20 mg Intravenous Q12H  . lidocaine (PF)  5 mL Intradermal Once  . lisinopril  2.5 mg Oral Daily  . polyethylene glycol  17 g Oral Daily  . potassium chloride  20 mEq Oral Daily  . senna  1 tablet Oral Daily  . sodium chloride flush  3 mL Intravenous Q12H  . temazepam  15 mg Oral QHS   Continuous Infusions: . sodium chloride     PRN Meds: sodium chloride, acetaminophen, ondansetron (ZOFRAN) IV, sodium chloride flush, traZODone   Vital Signs    Vitals:   08/15/17 2148 08/16/17 0623 08/16/17 0631 08/16/17 1130  BP: 102/63 (!) 113/56  (!) 96/56  Pulse: 64 68  (!) 52  Resp: 18 18  (!) 26  Temp: 98.1 F (36.7 C) 97.9 F (36.6 C)  (!) 97.5 F (36.4 C)  TempSrc: Oral Oral  Axillary  SpO2: 93% 96%  95%  Weight:   180 lb 5.4 oz (81.8 kg)   Height:        Intake/Output Summary (Last 24 hours) at 08/16/2017 1346 Last data filed at 08/15/2017 1803 Gross per 24 hour  Intake 160 ml  Output 653 ml  Net -493 ml   Filed Weights   08/14/17 0556 08/15/17 0447 08/16/17 0631  Weight: 177 lb 11.1 oz (80.6 kg) 180 lb 1.9 oz (81.7 kg) 180 lb 5.4 oz (81.8 kg)    Telemetry    2nd degree AVB, Mobitz 2 with rates in 40's-50's. - Personally Reviewed  ECG    No new tracing - Personally Reviewed  Physical Exam   GEN: No acute distress.   Neck: No JVD Cardiac: regularly irregular with dropping of every 3rd beat, no murmurs, gallop present.  Respiratory: Clear to auscultation bilaterally. Tracheostomy present GI: Soft, nontender, non-distended  MS: No edema; No deformity. Neuro:  Nonfocal  Psych: Normal affect   Labs    Chemistry Recent Labs  Lab 08/10/17 2037  08/14/17 0419  08/15/17 0407 08/16/17 0720  NA  --    < > 139 137 142  K  --    < > 4.3 3.4* 3.5  CL  --    < > 101 99* 106  CO2  --    < > 25 28 26   GLUCOSE  --    < > 89 97 84  BUN  --    < > 29* 26* 32*  CREATININE 1.53*   < > 1.44* 1.38* 1.35*  CALCIUM  --    < > 8.9 8.6* 8.2*  PROT 6.4*  --   --   --   --   ALBUMIN 3.4*  --   --   --   --   AST 23  --   --   --   --   ALT 15*  --   --   --   --   ALKPHOS 93  --   --   --   --   BILITOT 1.4*  --   --   --   --   GFRNONAA 38*   < > 41* 43* 45*  GFRAA 44*   < >  48* 50* 52*  ANIONGAP  --    < > 13 10 10    < > = values in this interval not displayed.     Hematology Recent Labs  Lab 08/14/17 0419 08/15/17 0407 08/16/17 0720  WBC 9.8 7.9 6.8  RBC 4.99 4.80 4.41  HGB 16.0 15.1 14.3  HCT 49.3 47.1 43.9  MCV 98.8 98.1 99.5  MCH 32.1 31.5 32.4  MCHC 32.5 32.1 32.6  RDW 15.0 15.0 15.3  PLT 148* 127* 109*    Cardiac Enzymes Recent Labs  Lab 08/10/17 1610  TROPONINI 0.04*   No results for input(s): TROPIPOC in the last 168 hours.   BNP Recent Labs  Lab 08/10/17 2037  BNP 2,687.6*     DDimer No results for input(s): DDIMER in the last 168 hours.   Radiology    Dg Knee 1-2 Views Left  Result Date: 08/14/2017 CLINICAL DATA:  Swelling EXAM: LEFT KNEE - 1-2 VIEW COMPARISON:  None. FINDINGS: There is marked narrowing of the medial, lateral, and patellofemoral compartments associated with osteophytes. Moderate joint effusion is present. There is soft tissue swelling of the anterior aspect of the knee. No acute fracture. There is dense atherosclerotic calcification of the popliteal artery which appears somewhat tortuous or distended. Popliteal artery aneurysm is not excluded. IMPRESSION: 1. Marked degenerative changes and joint effusion.  No fracture. 2. Extensive atherosclerotic calcification of the popliteal artery. Popliteal artery aneurysm should be considered. This could be further evaluated lower extremity arterial ultrasound.  Electronically Signed   By: Nolon Nations M.D.   On: 08/14/2017 19:45   Dg Abd 1 View  Result Date: 08/16/2017 CLINICAL DATA:  Vomiting. EXAM: ABDOMEN - 1 VIEW COMPARISON:  None. FINDINGS: Normal bowel gas pattern. No bowel dilatation to suggest obstruction. Small to moderate colonic stool burden. Atherosclerosis of the abdominal aorta. Surgical clips in the right groin. No radiopaque calculi. IMPRESSION: 1. Normal bowel gas pattern. Small to moderate colonic stool burden. 2. Aortic atherosclerosis. Electronically Signed   By: Jeb Levering M.D.   On: 08/16/2017 02:51    Cardiac Studies   Echocardiogram 08/13/2016 Study Conclusions  - Left ventricle: The cavity size was moderately dilated. Wall   thickness was increased in a pattern of mild LVH. Systolic   function was severely reduced. The estimated ejection fraction   was in the range of 20% to 25%. Diffuse hypokinesis. The study is   not technically sufficient to allow evaluation of LV diastolic   function. - Aortic valve: Trileaflet; mildly thickened, mildly calcified   leaflets. There was moderate regurgitation. - Aorta: Aortic root dimension: 40 mm (ED). - Ascending aorta: The ascending aorta was mildly dilated. - Mitral valve: Mitral valve repair, annuloplasty ring. There was   mild to moderate regurgitation directed eccentrically. - Left atrium: The atrium was moderately dilated. Volume/bsa, ES,   (1-plane Simpson&'s, A2C): 44.6 ml/m^2. - Right ventricle: The cavity size was mildly dilated. Wall   thickness was normal. - Right atrium: The atrium was moderately dilated. - Tricuspid valve: There was moderate regurgitation. - Pulmonic valve: There was moderate regurgitation. - Pulmonary arteries: Systolic pressure was mildly increased. PA   peak pressure: 39 mm Hg (S). - Pericardium, extracardiac: A trivial pericardial effusion was   identified. There was a left pleural effusion.   Patient Profile     84 y.o. male  with history of PAF, MVP with severe MR s/p repair, HTN, nonobstructive CAD, colon cancer, chronic systolic HF LVEF 35-57% by echo this  admit, admitted on 08/10/2017 with AMS and LE edema. From notes medical therapy for CHF has been limited by soft bp's.   Assessment & Plan    1. Chronic systolic heart failure -Admitted with volume overload. Was on lasix 40 mg IV BID, reduced to 20 mg IV bid on 08/14/17.  -Wt is fluctuating without significant net change. Pt had 650 ml UOP in last 24h, 1.5L out the day before. Net negative 4L fluid balance since admission.  -renal function is stable with SCr 1.35, trended down from 1.65. -Has had difficulty tolerating CHF meds, Not previously taking any meds other than lasix. Low dose carvedilol 3.125 mg was started but this was stopped yesterday due to bradycardia. Also was started on lisinopril 2.5 mg. BP is soft but stable, will continue ACE for now.  -continue current diuresis -LVEF has decreased fro 25-30% to 20-25%.  Per Dr. Nelly Laurence note there has been no ischemic evaluation to investigate this drop. Family had reported that prior to this admission with AMS the patient had been ambulatory, fed himself, dressed himself and could maintain a conversation. Dr. Harl Bowie suggested that if he returns to baseline an ischemic evaluation could be considered, though may not be a good candidate.   2. Bradycardia -Yesterday HR in the mid 40's at times with 2nd degree AVB type 1. BB was discontinued.  -Now in 2nd degree AVB, Mobitz 2. Pt is asymptomatic. Continue to monitor as BB washes out.   3. Afib -s/p maze procedure without recurrence -no recnet tachycardia, has not required anticoagulation    For questions or updates, please contact [place] Please consult [email or web address] for contact info under Cardiology/STEMI.  History and all data above reviewed.  Patient examined.  I agree with the findings as above. The patient denies chest pain or SOB. He is nauseated and  unable to keep anything down.   The patient exam reveals COR:RRR  ,  Lungs: Clear  ,  Abd: Positive bowel sounds, no rebound no guarding, Ext No edema  .  All available labs, radiology testing, previous records reviewed. Agree with documented assessment and plan. Ischemic Cardiomyopathy:  He has been tolerating low dose ACE inhibitor.  Beta blocker stopped.  Continue low dose IV Lasix today and then likely change to PO.    Jeneen Rinks Norah Devin  1:48 PM  08/16/2017       Signed, Minus Breeding, MD  08/16/2017, 1:46 PM

## 2017-08-16 NOTE — Progress Notes (Signed)
[facility] PROGRESS NOTE  Timothy Suarez LFY:[id number] DOB: 11/01/1924 DOA: 08/10/2017 PCP: Gayland Curry, DO  Brief summary   84 y.o.malewith medical history significant ofsystolic dysfunction CHF with last EF of25% here with Progressive lower extremity edema as well as hallucinations. He has been on Lasix 40 mg daily at home. Patient presented with signs and symptoms of CHF exacerbation.    Assessment/Plan:   Acute on chronic systolic dysfunction [id number] had significant bilateral lower extremity edema. BNP markedly elevated greater than 2000. Chest x-ray consistent with small bilateral pleural effusions and congestive heart failure.  Possibly could also have pneumonia in the left lung base.  procalcitonin normal.   Improved with IV lasix. Will transition to PO lasix AM. On low-dose ACE inhibitor and beta blockers. Most recent 2-D echo in May 2018 showed EF of 25-30%.   Repeat Echocardiogram showing 20-25%.  Patient does have a history of low blood pressure in the past and hence was not previously on a beta blocker. On lisinopril 2.5 mg daily and Coreg 3.125 mg twice a day (with hold parameters).  Primary cardiologist is Dr. Radford Pax.  -discontinued BB per cardiology due to bradycardia   hallucinations:Patient has mild cognitive impairment but this is new according to family.   Could be secondary to the above as well as possible pneumonia.  UA unremarkable. Discontinued levofloxacin, avoid fluoroquinolones in the elderly. MRI of the brain did not show any acute/early subacute stroke or mass.  Severe motion artifact.  Hallucinations resolved. Has some sundowning but stable at this time  Pharyngocutaneous fistula:Patient will require frequent cleaning and suctioning. So far no evidence of complications  Coronary artery disease:Patient appears stable at this point.  Possible pneumonia:Empirically was started on Levaquin which has been discontinued.   Received 5  days of Rocephin/doxycycline now.  Pro-calcitonin less than 0.10.   No fever and no white count however.  Avoid azithromycin due to QTC prolongation.   Afebrile.  Stable.   discontinue antibiotics.  Acute on chronic kidney disease: Creatinine at the time of admission was 1.65. It was 1.1 back in November.  Monitor kidney function closely especially with IV Lasix and lisinopril. creatinine is stable   Thrombocytopenia:Patient was on Lovenox for DVT prophylaxis.  His platelet count decreased to 127. Discontinued Lovenox and placed him on lower extremity SCDs.   Continue to monitor platelet count.  Nonspecific rash right upper thigh: Exact etiology unclear at this time.  Improving. No bedbugs per the daughter.    Left knee effusion. Patient has Baker's cyst. Gets periodic periodic aspiration by radiology. Daughter requesting if it can be done this hospitalization. Aspiration left knee requested. Per IR   Hypokalemia. Potassium replacement ordered.  Debility: Physical therapy consulted.    Code Status: full Family Communication: d/w patient, Therapist, sports. IR (indicate person spoken with, relationship, and if by phone, the number) Disposition Plan: pend clinical improvement. ? SNF 24 -48 hrs    Consultants:  IR  cards  Procedures:  Pend IR  Antibiotics: Anti-infectives (From admission, onward)   Start     Dose/Rate Route Frequency Ordered Stop   08/11/17 0900  doxycycline (VIBRAMYCIN) 100 mg in dextrose 5 % 250 mL IVPB  Status:  Discontinued     100 mg 125 mL/hr over 120 Minutes Intravenous Every 12 hours 08/11/17 0813 08/15/17 1356   08/11/17 0845  cefTRIAXone (ROCEPHIN) 1 g in dextrose 5 % 50 mL IVPB  Status:  Discontinued     1 g 100 mL/hr over 30  Minutes Intravenous Every 24 hours 08/11/17 0813 08/15/17 1356   08/10/17 2000  levofloxacin (LEVAQUIN) IVPB 500 mg  Status:  Discontinued     500 mg 100 mL/hr over 60 Minutes Intravenous Every 24 hours 08/10/17 1927 08/11/17  0803        (indicate start date, and stop date if known)  HPI/Subjective: Alert. No distress. Awaiting IR procedure   Objective: Vitals:   08/15/17 2148 08/16/17 0623  BP: 102/63 (!) 113/56  Pulse: 64 68  Resp: 18 18  Temp: 98.1 F (36.7 C) 97.9 F (36.6 C)  SpO2: 93% 96%    Intake/Output Summary (Last 24 hours) at 08/16/2017 1023 Last data filed at 08/15/2017 1803 Gross per 24 hour  Intake 160 ml  Output 653 ml  Net -493 ml   Filed Weights   08/14/17 0556 08/15/17 0447 08/16/17 0631  Weight: 80.6 kg (177 lb 11.1 oz) 81.7 kg (180 lb 1.9 oz) 81.8 kg (180 lb 5.4 oz)    Exam:   General:  No distress   Cardiovascular: s1,s2 rrr  Respiratory: few crackles LL  Abdomen: soft, nt, nd   Musculoskeletal: left knee edema    Data Reviewed: Basic Metabolic Panel: Recent Labs  Lab 08/12/17 0428 08/13/17 0520 08/14/17 0419 08/15/17 0407 08/16/17 0720  NA 141 140 139 137 142  K 3.1* 3.0* 4.3 3.4* 3.5  CL 111 103 101 99* 106  CO2 21* 27 25 28 26   GLUCOSE 90 92 89 97 84  BUN 32* 28* 29* 26* 32*  CREATININE 1.40* 1.38* 1.44* 1.38* 1.35*  CALCIUM 8.4* 8.5* 8.9 8.6* 8.2*   Liver Function Tests: Recent Labs  Lab 08/10/17 2037  AST 23  ALT 15*  ALKPHOS 93  BILITOT 1.4*  PROT 6.4*  ALBUMIN 3.4*   No results for input(s): LIPASE, AMYLASE in the last 168 hours. No results for input(s): AMMONIA in the last 168 hours. CBC: Recent Labs  Lab 08/11/17 0543 08/13/17 1531 08/14/17 0419 08/15/17 0407 08/16/17 0720  WBC 7.3 8.0 9.8 7.9 6.8  NEUTROABS 5.6  --   --   --   --   HGB 15.6 15.8 16.0 15.1 14.3  HCT 47.0 48.8 49.3 47.1 43.9  MCV 97.5 97.8 98.8 98.1 99.5  PLT 128* 135* 148* 127* PENDING   Cardiac Enzymes: Recent Labs  Lab 08/10/17 1610  TROPONINI 0.04*   BNP (last 3 results) Recent Labs    12/06/16 0910 03/23/17 1422 08/10/17 2037  BNP 286.9* 2,357.2* 2,687.6*    ProBNP (last 3 results) No results for input(s): PROBNP in the last 8760  hours.  CBG: Recent Labs  Lab 08/11/17 2121 08/15/17 2014 08/15/17 2350 08/16/17 0628 08/16/17 0749  GLUCAP 124* 84 93 76 78    Recent Results (from the past 240 hour(s))  MRSA PCR Screening     Status: None   Collection Time: 08/11/17  6:14 PM  Result Value Ref Range Status   MRSA by PCR NEGATIVE NEGATIVE Final    Comment:        The GeneXpert MRSA Assay (FDA approved for NASAL specimens only), is one component of a comprehensive MRSA colonization surveillance program. It is not intended to diagnose MRSA infection nor to guide or monitor treatment for MRSA infections.      Studies: Dg Knee 1-2 Views Left  Result Date: 08/14/2017 CLINICAL DATA:  Swelling EXAM: LEFT KNEE - 1-2 VIEW COMPARISON:  None. FINDINGS: There is marked narrowing of the medial, lateral, and patellofemoral compartments  associated with osteophytes. Moderate joint effusion is present. There is soft tissue swelling of the anterior aspect of the knee. No acute fracture. There is dense atherosclerotic calcification of the popliteal artery which appears somewhat tortuous or distended. Popliteal artery aneurysm is not excluded. IMPRESSION: 1. Marked degenerative changes and joint effusion.  No fracture. 2. Extensive atherosclerotic calcification of the popliteal artery. Popliteal artery aneurysm should be considered. This could be further evaluated lower extremity arterial ultrasound. Electronically Signed   By: Nolon Nations M.D.   On: 08/14/2017 19:45   Dg Abd 1 View  Result Date: 08/16/2017 CLINICAL DATA:  Vomiting. EXAM: ABDOMEN - 1 VIEW COMPARISON:  None. FINDINGS: Normal bowel gas pattern. No bowel dilatation to suggest obstruction. Small to moderate colonic stool burden. Atherosclerosis of the abdominal aorta. Surgical clips in the right groin. No radiopaque calculi. IMPRESSION: 1. Normal bowel gas pattern. Small to moderate colonic stool burden. 2. Aortic atherosclerosis. Electronically Signed   By:  Jeb Levering M.D.   On: 08/16/2017 02:51    Scheduled Meds: . aspirin EC  81 mg Oral Daily  . atorvastatin  40 mg Oral Daily  . furosemide  20 mg Intravenous Q12H  . lidocaine (PF)  5 mL Intradermal Once  . lisinopril  2.5 mg Oral Daily  . potassium chloride  20 mEq Oral Daily  . sodium chloride flush  3 mL Intravenous Q12H  . temazepam  15 mg Oral QHS   Continuous Infusions: . sodium chloride      Principal Problem:   Systolic CHF, acute on chronic (HCC) Active Problems:   Coronary artery disease   Mild cognitive impairment   Pharyngocutaneous fistula   Hallucination   CHF (congestive heart failure) (HCC)   Swelling of joint of left knee    Time spent: >35 minutes     Kinnie Feil  [facility] Pager [phone]. If 7PM-7AM, please contact night-coverage at [email or web address], password [facility] 08/16/2017, 10:23 AM  LOS: 6 days

## 2017-08-16 NOTE — Progress Notes (Signed)
RT came to assess patients stoma. Pt was being transported to IR for procedure. Pt in no distress on RA. Will cont to monitor

## 2017-08-17 ENCOUNTER — Inpatient Hospital Stay (HOSPITAL_COMMUNITY): Payer: Medicare Other

## 2017-08-17 LAB — GLUCOSE, CAPILLARY
GLUCOSE-CAPILLARY: 83 mg/dL (ref 65–99)
GLUCOSE-CAPILLARY: 108 mg/dL — AB (ref 65–99)

## 2017-08-17 MED ORDER — LIDOCAINE HCL (PF) 1 % IJ SOLN
INTRAMUSCULAR | Status: AC
  Filled 2017-08-17: qty 30

## 2017-08-17 MED ORDER — METHYLPREDNISOLONE ACETATE 40 MG/ML IJ SUSP
INTRAMUSCULAR | Status: AC
  Filled 2017-08-17: qty 2

## 2017-08-17 MED ORDER — FUROSEMIDE 40 MG PO TABS
40.0000 mg | ORAL_TABLET | Freq: Every day | ORAL | Status: DC
  Administered 2017-08-17 – 2017-08-19 (×3): 40 mg via ORAL
  Filled 2017-08-17 (×3): qty 1

## 2017-08-17 NOTE — Progress Notes (Signed)
Progress Note  Patient Name: Timothy Suarez Date of Encounter: 08/17/2017  Primary Cardiologist:   Fransico Him, MD   Subjective   The patient says that he is no longer having nausea and he was able to eat.   Inpatient Medications    Scheduled Meds: . aspirin EC  81 mg Oral Daily  . atorvastatin  40 mg Oral Daily  . furosemide  20 mg Intravenous Q12H  . lidocaine (PF)  5 mL Intradermal Once  . lisinopril  2.5 mg Oral Daily  . methylPREDNISolone acetate  120 mg Intra-articular Once  . polyethylene glycol  17 g Oral Daily  . potassium chloride  20 mEq Oral Daily  . senna  1 tablet Oral Daily  . sodium chloride flush  3 mL Intravenous Q12H  . temazepam  15 mg Oral QHS   Continuous Infusions: . sodium chloride     PRN Meds: sodium chloride, acetaminophen, ondansetron (ZOFRAN) IV, sodium chloride flush, traZODone   Vital Signs    Vitals:   08/16/17 0631 08/16/17 1130 08/16/17 1920 08/17/17 0542  BP:  (!) 96/56 119/62 108/80  Pulse:  (!) 52 62 62  Resp:  (!) 26 18 18   Temp:  (!) 97.5 F (36.4 C) 98.4 F (36.9 C) 98.1 F (36.7 C)  TempSrc:  Axillary Oral Oral  SpO2:  95% 96% 98%  Weight: 180 lb 5.4 oz (81.8 kg)   164 lb 14.5 oz (74.8 kg)  Height:        Intake/Output Summary (Last 24 hours) at 08/17/2017 0803 Last data filed at 08/17/2017 0700 Gross per 24 hour  Intake 50 ml  Output 900 ml  Net -850 ml   Filed Weights   08/15/17 0447 08/16/17 0631 08/17/17 0542  Weight: 180 lb 1.9 oz (81.7 kg) 180 lb 5.4 oz (81.8 kg) 164 lb 14.5 oz (74.8 kg)    Telemetry    NSR with ectopy - Personally Reviewed  ECG    NA - Personally Reviewed  Physical Exam   GEN: No acute distress.   Neck: No  JVD Cardiac: RRR, no murmurs, rubs, or gallops.  Respiratory: Clear  to auscultation bilaterally. GI: Soft, nontender, non-distended  MS: No  edema; No deformity. Neuro:  Nonfocal  Psych: Normal affect   Labs    Chemistry Recent Labs  Lab 08/10/17 2037   08/14/17 0419 08/15/17 0407 08/16/17 0720  NA  --    < > 139 137 142  K  --    < > 4.3 3.4* 3.5  CL  --    < > 101 99* 106  CO2  --    < > 25 28 26   GLUCOSE  --    < > 89 97 84  BUN  --    < > 29* 26* 32*  CREATININE 1.53*   < > 1.44* 1.38* 1.35*  CALCIUM  --    < > 8.9 8.6* 8.2*  PROT 6.4*  --   --   --   --   ALBUMIN 3.4*  --   --   --   --   AST 23  --   --   --   --   ALT 15*  --   --   --   --   ALKPHOS 93  --   --   --   --   BILITOT 1.4*  --   --   --   --   GFRNONAA 38*   < >  41* 43* 45*  GFRAA 44*   < > 48* 50* 52*  ANIONGAP  --    < > 13 10 10    < > = values in this interval not displayed.     Hematology Recent Labs  Lab 08/14/17 0419 08/15/17 0407 08/16/17 0720  WBC 9.8 7.9 6.8  RBC 4.99 4.80 4.41  HGB 16.0 15.1 14.3  HCT 49.3 47.1 43.9  MCV 98.8 98.1 99.5  MCH 32.1 31.5 32.4  MCHC 32.5 32.1 32.6  RDW 15.0 15.0 15.3  PLT 148* 127* 109*    Cardiac Enzymes Recent Labs  Lab 08/10/17 1610  TROPONINI 0.04*   No results for input(s): TROPIPOC in the last 168 hours.   BNP Recent Labs  Lab 08/10/17 2037  BNP 2,687.6*     DDimer No results for input(s): DDIMER in the last 168 hours.   Radiology    Dg Abd 1 View  Result Date: 08/16/2017 CLINICAL DATA:  Vomiting. EXAM: ABDOMEN - 1 VIEW COMPARISON:  None. FINDINGS: Normal bowel gas pattern. No bowel dilatation to suggest obstruction. Small to moderate colonic stool burden. Atherosclerosis of the abdominal aorta. Surgical clips in the right groin. No radiopaque calculi. IMPRESSION: 1. Normal bowel gas pattern. Small to moderate colonic stool burden. 2. Aortic atherosclerosis. Electronically Signed   By: Jeb Levering M.D.   On: 08/16/2017 02:51    Cardiac Studies   ECHO:  08/13/17  Study Conclusions  - Left ventricle: The cavity size was moderately dilated. Wall   thickness was increased in a pattern of mild LVH. Systolic   function was severely reduced. The estimated ejection fraction   was in  the range of 20% to 25%. Diffuse hypokinesis. The study is   not technically sufficient to allow evaluation of LV diastolic   function. - Aortic valve: Trileaflet; mildly thickened, mildly calcified   leaflets. There was moderate regurgitation. - Aorta: Aortic root dimension: 40 mm (ED). - Ascending aorta: The ascending aorta was mildly dilated. - Mitral valve: Mitral valve repair, annuloplasty ring. There was   mild to moderate regurgitation directed eccentrically. - Left atrium: The atrium was moderately dilated. Volume/bsa, ES,   (1-plane Simpson&'s, A2C): 44.6 ml/m^2. - Right ventricle: The cavity size was mildly dilated. Wall   thickness was normal. - Right atrium: The atrium was moderately dilated. - Tricuspid valve: There was moderate regurgitation. - Pulmonic valve: There was moderate regurgitation. - Pulmonary arteries: Systolic pressure was mildly increased. PA   peak pressure: 39 mm Hg (S). - Pericardium, extracardiac: A trivial pericardial effusion was   identified. There was a left pleural effusion.  Patient Profile     84 y.o. male with severe MR s/p repair, HTN, nonobstructive CAD, colon cancer, chronic systolic HF LVEF 89-38% by echo this admit, admitted on 08/10/2017 with AMS and LE edema. From notes medical therapy for CHF has been limited by soft bp's.    Assessment & Plan    CHRONIC SYSTOLIC HF:  Limited medical management.  Tolerating ACE inhibitor.  Change to PO Lasix.    BRADYCARDIA:  Unable to tolerate beta blocker.  ATRIAL FIB:  No recurrent arrhythmia.    We will follow as needed.    For questions or updates, please contact [place] Please consult [email or web address] for contact info under Cardiology/STEMI.   Signed, Minus Breeding, MD  08/17/2017, 8:04 AM

## 2017-08-17 NOTE — Progress Notes (Signed)
Per Doctor ok to discontinue the telemonitoring in preparation for discharge to SNF.

## 2017-08-17 NOTE — Progress Notes (Signed)
Patient's daughter wanted resp. to suction stoma. Called RT.

## 2017-08-17 NOTE — Progress Notes (Signed)
[facility] PROGRESS NOTE  CHETT TANIGUCHI NAT:[id number] DOB: Aug 12, 1924 DOA: 08/10/2017 PCP: Gayland Curry, DO  Brief summary   84 y.o.malewith medical history significant ofsystolic dysfunction CHF with last EF of25% here with Progressive lower extremity edema as well as hallucinations. He has been on Lasix 40 mg daily at home. Patient presented with signs and symptoms of CHF exacerbation.    Assessment/Plan:   Acute on chronic systolic dysfunction KYH:[id number] had significant bilateral lower extremity edema. BNP markedly elevated greater than 2000. Chest x-ray consistent with small bilateral pleural effusions and congestive heart failure.  Possibly could also have pneumonia in the left lung base.  procalcitonin normal.   Improved with IV lasix. Then transition to PO lasix. On low-dose ACE inhibitor and beta blockers. Most recent 2-D echo in May 2018 showed EF of 25-30%.   Repeat Echocardiogram showing 20-25%.  Patient does have a history of low blood pressure in the past and hence was not previously on a beta blocker. On lisinopril 2.5 mg daily and Coreg 3.125 mg twice a day (with hold parameters).  Primary cardiologist is Dr. Radford Pax.  -discontinued BB per cardiology due to bradycardia   Hallucinations:Patient has mild cognitive impairment but this is new according to family.   Could be secondary to the above as well as possible pneumonia.  UA unremarkable. Discontinued levofloxacin, avoid fluoroquinolones in the elderly. MRI of the brain did not show any acute/early subacute stroke or mass.  Severe motion artifact.  Hallucinations resolved. Has some sundowning but stable at this time. Unable to d/c tele monitor/sitter yet   Pharyngocutaneous fistula:Patient will require frequent cleaning and suctioning. So far no evidence of complications  Coronary artery disease:Patient appears stable at this point.  Possible pneumonia:Empirically was started on Levaquin which  has been discontinued.   Received 5 days of Rocephin/doxycycline now.  Pro-calcitonin less than 0.10.   No fever and no white count however.  Avoid azithromycin due to QTC prolongation.   Afebrile.  Stable.   discontinue antibiotics.  Acute on chronic kidney disease: Creatinine at the time of admission was 1.65. It was 1.1 back in November.  Monitor kidney function closely especially with IV Lasix and lisinopril. creatinine is stable   Thrombocytopenia:Patient was on Lovenox for DVT prophylaxis.  His platelet count decreased to 127. Discontinued Lovenox and placed him on lower extremity SCDs.   Continue to monitor platelet count.  Nonspecific rash right upper thigh: Exact etiology unclear at this time.  Improving. No bedbugs per the daughter.    Left knee effusion. Patient has Baker's cyst. Gets periodic periodic aspiration by radiology. Daughter requesting if it can be done this hospitalization. Aspiration left knee requested. D/w IR   Hypokalemia. Potassium replacement ordered.  Debility: Physical therapy consulted.    Code Status: full Family Communication: d/w patient, Therapist, sports. IR updated his family (indicate person spoken with, relationship, and if by phone, the number) Disposition Plan: SNF 24 -48 hrs. Still requiring telesitter    Consultants:  IR  cards  Procedures:  Pend IR  Antibiotics: Anti-infectives (From admission, onward)   Start     Dose/Rate Route Frequency Ordered Stop   08/11/17 0900  doxycycline (VIBRAMYCIN) 100 mg in dextrose 5 % 250 mL IVPB  Status:  Discontinued     100 mg 125 mL/hr over 120 Minutes Intravenous Every 12 hours 08/11/17 0813 08/15/17 1356   08/11/17 0845  cefTRIAXone (ROCEPHIN) 1 g in dextrose 5 % 50 mL IVPB  Status:  Discontinued  1 g 100 mL/hr over 30 Minutes Intravenous Every 24 hours 08/11/17 0813 08/15/17 1356   08/10/17 2000  levofloxacin (LEVAQUIN) IVPB 500 mg  Status:  Discontinued     500 mg 100 mL/hr over 60  Minutes Intravenous Every 24 hours 08/10/17 1927 08/11/17 0803       (indicate start date, and stop date if known)  HPI/Subjective: Alert. No distress. Awaiting IR procedure   Objective: Vitals:   08/17/17 0542 08/17/17 1011  BP: 108/80 (!) 101/54  Pulse: 62 64  Resp: 18   Temp: 98.1 F (36.7 C)   SpO2: 98% 97%    Intake/Output Summary (Last 24 hours) at 08/17/2017 1018 Last data filed at 08/17/2017 0700 Gross per 24 hour  Intake 50 ml  Output 900 ml  Net -850 ml   Filed Weights   08/15/17 0447 08/16/17 0631 08/17/17 0542  Weight: 81.7 kg (180 lb 1.9 oz) 81.8 kg (180 lb 5.4 oz) 74.8 kg (164 lb 14.5 oz)    Exam:   General:  No distress   Cardiovascular: s1,s2 rrr  Respiratory: few crackles LL  Abdomen: soft, nt, nd   Musculoskeletal: left knee edema    Data Reviewed: Basic Metabolic Panel: Recent Labs  Lab 08/12/17 0428 08/13/17 0520 08/14/17 0419 08/15/17 0407 08/16/17 0720  NA 141 140 139 137 142  K 3.1* 3.0* 4.3 3.4* 3.5  CL 111 103 101 99* 106  CO2 21* 27 25 28 26   GLUCOSE 90 92 89 97 84  BUN 32* 28* 29* 26* 32*  CREATININE 1.40* 1.38* 1.44* 1.38* 1.35*  CALCIUM 8.4* 8.5* 8.9 8.6* 8.2*   Liver Function Tests: Recent Labs  Lab 08/10/17 2037  AST 23  ALT 15*  ALKPHOS 93  BILITOT 1.4*  PROT 6.4*  ALBUMIN 3.4*   No results for input(s): LIPASE, AMYLASE in the last 168 hours. No results for input(s): AMMONIA in the last 168 hours. CBC: Recent Labs  Lab 08/11/17 0543 08/13/17 1531 08/14/17 0419 08/15/17 0407 08/16/17 0720  WBC 7.3 8.0 9.8 7.9 6.8  NEUTROABS 5.6  --   --   --   --   HGB 15.6 15.8 16.0 15.1 14.3  HCT 47.0 48.8 49.3 47.1 43.9  MCV 97.5 97.8 98.8 98.1 99.5  PLT 128* 135* 148* 127* 109*   Cardiac Enzymes: Recent Labs  Lab 08/10/17 1610  TROPONINI 0.04*   BNP (last 3 results) Recent Labs    12/06/16 0910 03/23/17 1422 08/10/17 2037  BNP 286.9* 2,357.2* 2,687.6*    ProBNP (last 3 results) No results for  input(s): PROBNP in the last 8760 hours.  CBG: Recent Labs  Lab 08/16/17 1212 08/16/17 1310 08/16/17 1744 08/16/17 2347 08/17/17 0649  GLUCAP 63* 70 107* 85 83    Recent Results (from the past 240 hour(s))  MRSA PCR Screening     Status: None   Collection Time: 08/11/17  6:14 PM  Result Value Ref Range Status   MRSA by PCR NEGATIVE NEGATIVE Final    Comment:        The GeneXpert MRSA Assay (FDA approved for NASAL specimens only), is one component of a comprehensive MRSA colonization surveillance program. It is not intended to diagnose MRSA infection nor to guide or monitor treatment for MRSA infections.      Studies: Dg Abd 1 View  Result Date: 08/16/2017 CLINICAL DATA:  Vomiting. EXAM: ABDOMEN - 1 VIEW COMPARISON:  None. FINDINGS: Normal bowel gas pattern. No bowel dilatation to suggest obstruction. Small  to moderate colonic stool burden. Atherosclerosis of the abdominal aorta. Surgical clips in the right groin. No radiopaque calculi. IMPRESSION: 1. Normal bowel gas pattern. Small to moderate colonic stool burden. 2. Aortic atherosclerosis. Electronically Signed   By: Jeb Levering M.D.   On: 08/16/2017 02:51    Scheduled Meds: . methylPREDNISolone acetate      . aspirin EC  81 mg Oral Daily  . atorvastatin  40 mg Oral Daily  . furosemide  40 mg Oral Daily  . lidocaine (PF)  5 mL Intradermal Once  . lisinopril  2.5 mg Oral Daily  . methylPREDNISolone acetate  120 mg Intra-articular Once  . polyethylene glycol  17 g Oral Daily  . potassium chloride  20 mEq Oral Daily  . senna  1 tablet Oral Daily  . sodium chloride flush  3 mL Intravenous Q12H  . temazepam  15 mg Oral QHS   Continuous Infusions: . sodium chloride      Principal Problem:   Systolic CHF, acute on chronic (HCC) Active Problems:   Coronary artery disease   Mild cognitive impairment   Pharyngocutaneous fistula   Hallucination   CHF (congestive heart failure) (HCC)   Swelling of joint of  left knee    Time spent: >35 minutes     Kinnie Feil  [facility] Pager [phone]. If 7PM-7AM, please contact night-coverage at [email or web address], password [facility] 08/17/2017, 10:18 AM  LOS: 7 days

## 2017-08-17 NOTE — Progress Notes (Signed)
Patient slept well overnight, following command, pt is confused but did not try to pull out the things, vitals stable, CBG maintained, will continue to monitor the patient

## 2017-08-17 NOTE — Care Management Note (Addendum)
Case Management Note  Patient Details  Name: Timothy Suarez MRN: [id number] Date of Birth: [birth year]  Subjective/Objective:    Admitted for congestive heart failure.        Action/Plan: Prior to admission patient lived at [facility] ([place]  Clarise Cruz CSW is following for skilled Nursing facility.  Expected Discharge Date:  (unknown)               Expected Discharge Plan:  [place]  In-House Referral: Social worker  Discharge planning Services  CM Consult  Status of Service:  In process, will continue to follow  [place], RN Case Manager-Orientation [phone] 08/17/2017, 1:41 PM

## 2017-08-17 NOTE — Progress Notes (Addendum)
  Speech Language Pathology Treatment: Dysphagia  Patient Details Name: Timothy Suarez MRN: [id number] DOB: Jul 17, 1925 Today's Date: 08/17/2017 Time:  - 7:30-8:03    Assessment / Plan / Recommendation Clinical Impression  Recommended pt be observed with full meal due to impulsivity and to better assess possibility of pharyngoesophageal fistula. Pt removed large crusty phlegm over stoma. He managed most packages for self feeding with adequate rate but needed cues to mostly clear oral cavity before next bites. Cleared all oral residue independently at end of meal. No regurgitation orally or via stoma over 25 minute period or coughing. Reiterated esophageal strategies with pt. Recommend continue regular diet and thin liquids. ST will sign off.     HPI HPI: 84 y.o.malewith medical history significant ofsystolic dysfunction CHF with last EF of25% here with Progressive lower extremity edema as well as hallucinations. He has been on Lasix 40 mg daily at home. Patient presented with signs and symptoms of CHF exacerbation. Pt has a history of laryngeal cancer (right vocal cord) and total laryngectomy 01/15/16, PEG tube placement, removed 10/08/16. TEP 8/17. 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 Gretta Began 04/2016 and 07/2016.        SLP Plan          Recommendations  Diet recommendations: Regular;Thin liquid Liquids provided via: (prefers cup) Medication Administration: Whole meds with liquid Supervision: Patient able to self feed Compensations: Lingual sweep for clearance of pocketing;Slow rate;Small sips/bites Postural Changes and/or Swallow Maneuvers: Seated upright 90 degrees                Oral Care Recommendations: Oral care BID Follow up Recommendations: Skilled Nursing facility SLP Visit Diagnosis: Dysphagia, unspecified (R13.10)       GO                Houston Siren 08/17/2017, 7:58 AM  Orbie Pyo Colvin Caroli.Ed  [employer] [phone]

## 2017-08-18 DIAGNOSIS — R443 Hallucinations, unspecified: Secondary | ICD-10-CM

## 2017-08-18 DIAGNOSIS — J189 Pneumonia, unspecified organism: Secondary | ICD-10-CM

## 2017-08-18 DIAGNOSIS — N179 Acute kidney failure, unspecified: Secondary | ICD-10-CM

## 2017-08-18 DIAGNOSIS — D696 Thrombocytopenia, unspecified: Secondary | ICD-10-CM

## 2017-08-18 DIAGNOSIS — R7989 Other specified abnormal findings of blood chemistry: Secondary | ICD-10-CM

## 2017-08-18 LAB — GLUCOSE, CAPILLARY
GLUCOSE-CAPILLARY: 107 mg/dL — AB (ref 65–99)
Glucose-Capillary: 107 mg/dL — ABNORMAL HIGH (ref 65–99)
Glucose-Capillary: 117 mg/dL — ABNORMAL HIGH (ref 65–99)

## 2017-08-18 MED ORDER — LISINOPRIL 2.5 MG PO TABS: 2.5000 mg | ORAL_TABLET | Freq: Every day | ORAL | Status: DC

## 2017-08-18 NOTE — Social Work (Signed)
[facility] still has not received insurance authorization for pt. Will notify CSW when they have that available.   3:05pm- CSW spoke with pt daughter Hilda Blades, updated her that insurance authorization is still pending. CSW will call her if Helene Kelp is able to get authorization.  CSW continuing to follow to support discharge to [facility] when authorized.   Alexander Mt, [place] Work [phone]

## 2017-08-18 NOTE — Discharge Summary (Signed)
Physician Discharge Summary  Timothy Suarez YFV:[id number] DOB: 1924/10/01 DOA: 08/10/2017  PCP: Gayland Curry, DO  Admit date: 08/10/2017 Discharge date: 08/18/2017  Recommendations for Outpatient Follow-up:  1. Acute on chronic systolic CHF. Continue low dose ACE-I and furosemide per cardiology. F/u with cardiology for consideration of outpatient ischemic study. 2. Thrombocytopenia present on admission, overall stable, seen once in past, f/u as outpatient as clinically indicated 3. Extensive atherosclerotic calcification of the popliteal artery. 4. Popliteal artery aneurysm should be considered. This could be further evaluated lower extremity arterial ultrasound in the outpatient setting 5. TSH high, 7.512, consider repeat as outpt   Contact information for follow-up providers    Reed, Tiffany L, DO. Schedule an appointment as soon as possible for a visit in 1 week(s).   Specialty:  Geriatric Medicine Contact information: [address] [place] [address] [phone]        Sueanne Margarita, MD .   Specialty:  Cardiology Contact information: [phone] [address] [place] [address] [phone]            Contact information for after-discharge care    Destination    [facility] Follow up.   Service:  Skilled Nursing Contact information: [address] [place] [phone]                   Discharge Diagnoses:  1. Acute on chronic systolic CHF secondary to ischemic cardiomyopathy. 2. Hallucinations 3. Pneumonia  4. Thrombocytopenia 5. AKI on CKD stage III (suspected) 6. Elevated TSH   Discharge Condition: improved Disposition: SNF  Diet recommendation: heart healthy  Filed Weights   08/16/17 0631 08/17/17 0542 08/18/17 0528  Weight: 81.8 kg (180 lb 5.4 oz) 74.8 kg (164 lb 14.5 oz) 74.6 kg (164 lb 7.4 oz)    History of present illness:  89yom living independently at [facility] PMH systolic CHF, afib, stroke, colon cancer, vocal cord cancer s/p trach self-suctions, presented with gouch, generalized weakness, hallucinations, weight gain with severe edema, confusion. Admitted for acute CHF, hallucinations.   Hospital Course:  Treated with aggressive diuresis and seen by cardiology. PT rec SNF. Mental status waxed and waned, but sitter was required 1/8 and then d/c'd 1/8. Seen 1/9 and stable for discharge.  Acute on chronic systolic CHF secondary to ischemic cardiomyopathy. LVEF 25-30%, stable. Not on outpatient meds except diuretics for low BP. - down 6.5 liters since admit, UOP 1850 on PO Lasix.  - did not tolerate BB this admission (and has not been able to tolerate as outpt secondary to low BP) - continue low dose ACE-I and furosemide per cardiology. - f/u with cardiology for consideration of outpatient ischemic study  Hallucinations superimposed on mild cognitive impairment. MRI brain and U/A unremarkable. - resolved, no sitter at bedside, suspect secondary to acute illness  Pneumonia treated with 5 days of abx.  Thrombocytopenia present on admission - overall stable, seen once in past, f/u as outpatient - repeat CBC today. Not on heparin/enoxaparin  AKI on CKD stage III (suspected) - appears stable/improved after diuresis  Left knee Baker's cyst, chronic, gets periodic aspiration - Extensive atherosclerotic calcification of the popliteal artery. Popliteal artery aneurysm should be considered. This could be further evaluated lower extremity arterial ultrasound in the outpatient setting  TSH high, 7.512 - repeat as outpatient  Afib s/p Maze procedure w/o recurrence.  PMH colon cancer s/p partial colectomy PMH pharyngocutaneous fistula, s/p salvage laryngectomy,  s/p vocal cord cancer   Consultants:  Cardiology   Procedures:  S/p aspiration recurrent left Baker's cyst  Echo Study Conclusions  - Left ventricle: The cavity size  was moderately dilated. Wall thickness was increased in a pattern of mild LVH. Systolic function was severely reduced. The estimated ejection fraction was in the range of 20% to 25%. Diffuse hypokinesis. The study is not technically sufficient to allow evaluation of LV diastolic function. - Aortic valve: Trileaflet; mildly thickened, mildly calcified leaflets. There was moderate regurgitation. - Aorta: Aortic root dimension: 40 mm (ED). - Ascending aorta: The ascending aorta was mildly dilated. - Mitral valve: Mitral valve repair, annuloplasty ring. There was mild to moderate regurgitation directed eccentrically. - Left atrium: The atrium was moderately dilated. Volume/bsa, ES, (1-plane Simpson&'s, A2C): 44.6 ml/m^2. - Right ventricle: The cavity size was mildly dilated. Wall thickness was normal. - Right atrium: The atrium was moderately dilated. - Tricuspid valve: There was moderate regurgitation. - Pulmonic valve: There was moderate regurgitation. - Pulmonary arteries: Systolic pressure was mildly increased. PA peak pressure: 39 mm Hg (S). - Pericardium, extracardiac: A trivial pericardial effusion was identified. There was a left pleural effusion.   Today's assessment: S:Feels fine, no complaints.  RN notes sitter removed yesterday, no aggressive outbursts.   O: Vitals:  Vitals:   08/18/17 1041 08/18/17 1210  BP: 112/64 (!) 105/54  Pulse:  (!) 50  Resp:  18  Temp:  98.3 F (36.8 C)  SpO2: 97% 98%    Constitutional:   Appears calm and comfortable Eyes:   pupils and irises appear normal  Normal lids  ENMT:   grossly normal hearing  Respiratory:   CTA bilaterally, no w/r/r.   Respiratory effort normal.  Cardiovascular:   RRR, no m/r/g  No LE extremity edema    Telemetry SR Skin:   No rashes, lesions, ulcers Psychiatric:   Mental status ? Mood, affect appropriate  UOP 1850 -6.5 L since admission  Labs:  CBG  stable  Creatinine stable 1.25 on 1/7  Plts 109 on 1/7   Discharge Instructions   Allergies as of 08/18/2017      Reactions   Ambien [zolpidem] Other (See Comments)   Causes confusion   Citalopram Nausea Only   Trazodone And Nefazodone Other (See Comments)   Dry mouth   Zoloft [sertraline Hcl] Other (See Comments)   dizzy      Medication List    STOP taking these medications   zolpidem 5 MG tablet Commonly known as:  AMBIEN     TAKE these medications   atorvastatin 40 MG tablet Commonly known as:  LIPITOR Take 1 tablet (40 mg total) by mouth daily.   furosemide 40 MG tablet Commonly known as:  LASIX TAKE ONE TABLET BY MOUTH DAILY   lisinopril 2.5 MG tablet Commonly known as:  PRINIVIL,ZESTRIL Take 1 tablet (2.5 mg total) by mouth daily. Start taking on:  08/19/2017      Allergies  Allergen Reactions  . Ambien [Zolpidem] Other (See Comments)    Causes confusion  . Citalopram Nausea Only  . Trazodone And Nefazodone Other (See Comments)    Dry mouth  . Zoloft [Sertraline Hcl] Other (See Comments)    dizzy    The results of significant diagnostics from this hospitalization (including imaging, microbiology, ancillary and laboratory) are listed below for reference.    Significant Diagnostic Studies: Dg Knee 1-2 Views Left  Result Date: 08/14/2017 CLINICAL DATA:  Swelling EXAM: LEFT KNEE - 1-2  VIEW COMPARISON:  None. FINDINGS: There is marked narrowing of the medial, lateral, and patellofemoral compartments associated with osteophytes. Moderate joint effusion is present. There is soft tissue swelling of the anterior aspect of the knee. No acute fracture. There is dense atherosclerotic calcification of the popliteal artery which appears somewhat tortuous or distended. Popliteal artery aneurysm is not excluded. IMPRESSION: 1. Marked degenerative changes and joint effusion.  No fracture. 2. Extensive atherosclerotic calcification of the popliteal artery. Popliteal artery  aneurysm should be considered. This could be further evaluated lower extremity arterial ultrasound. Electronically Signed   By: Nolon Nations M.D.   On: 08/14/2017 19:45   Dg Abd 1 View  Result Date: 08/16/2017 CLINICAL DATA:  Vomiting. EXAM: ABDOMEN - 1 VIEW COMPARISON:  None. FINDINGS: Normal bowel gas pattern. No bowel dilatation to suggest obstruction. Small to moderate colonic stool burden. Atherosclerosis of the abdominal aorta. Surgical clips in the right groin. No radiopaque calculi. IMPRESSION: 1. Normal bowel gas pattern. Small to moderate colonic stool burden. 2. Aortic atherosclerosis. Electronically Signed   By: Jeb Levering M.D.   On: 08/16/2017 02:51   Ct Head Wo Contrast  Result Date: 08/12/2017 CLINICAL DATA:  84 y/o  M; altered mental status.  Hallucinations. EXAM: CT HEAD WITHOUT CONTRAST TECHNIQUE: Contiguous axial images were obtained from the base of the skull through the vertex without intravenous contrast. COMPARISON:  04/06/2014 CT head. FINDINGS: Brain: No evidence of acute infarction, hemorrhage, hydrocephalus, extra-axial collection or mass lesion/mass effect. Stable chronic microvascular ischemic changes and parenchymal volume loss of the brain. Vascular: Calcific atherosclerosis of carotid siphons. No hyperdense vessel. Skull: Normal. Negative for fracture or focal lesion. Sinuses/Orbits: No acute finding. Other: Bilateral intra-ocular lens replacement. IMPRESSION: 1. No acute intracranial abnormality identified. 2. Stable chronic microvascular ischemic changes and parenchymal volume loss of the brain. Electronically Signed   By: Kristine Garbe M.D.   On: 08/12/2017 21:06   Mr Brain Wo Contrast  Result Date: 08/12/2017 CLINICAL DATA:  84 y/o  M; altered mental status and hallucinations. EXAM: MRI HEAD WITHOUT CONTRAST TECHNIQUE: Axial DWI, coronal DWI, axial T2 FLAIR propeller, axial T2 propeller sequences were acquired. As patient was disoriented additional  sequences were not acquired. COMPARISON:  08/12/2017 and 04/06/2014 CT head. FINDINGS: Severe motion degradation of all sequences. No reduced diffusion identified to suggest acute or early subacute infarction. No focal mass effect. Parenchymal volume loss and chronic microvascular ischemic changes of the brain are grossly stable from prior studies. IMPRESSION: Severe motion artifact. No acute/early subacute stroke or mass effect identified. Consider repeat imaging when patient is able to follow commands and hold still. Electronically Signed   By: Kristine Garbe M.D.   On: 08/12/2017 22:37   US Aspiration  Result Date: 08/17/2017 CLINICAL DATA:  Recurrent symptomatic Baker's cyst, left knee EXAM: US ASPIRATION COMPARISON:  05/27/2017 and previous TECHNIQUE: The procedure, risks (including but not limited to bleeding, infection, organ damage ), benefits, and alternatives were explained to the patient. Questions regarding the procedure were encouraged and answered. The patient understands and consents to the procedure. Survey ultrasound of the popliteal fossa was performed and a medial posterior elongated fluid collection was identified. Overlying skin prepped with chlorhexidine, draped in usual sterile fashion, infiltrated locally with 1% lidocaine. An 18-gauge needle was advanced into the collection under ultrasound guidance. Approximately 33mL of clear straw-colored fluid were aspirated. 80 mg Depo-Medrol were then administered into the collection. Patient tolerated procedure . COMPLICATIONS: None immediate IMPRESSION: 1. Technically successful aspiration  and injection of right popliteal cyst. Electronically Signed   By: Lucrezia Europe M.D.   On: 08/17/2017 14:10   Dg Chest Port 1 View  Result Date: 08/10/2017 CLINICAL DATA:  Shortness of breath EXAM: PORTABLE CHEST 1 VIEW COMPARISON:  March 24, 2017 FINDINGS: The mediastinal contour is normal. Heart size is enlarged. The aorta is tortuous. There is  patchy consolidation of left lung base. Bilateral pleural effusions are noted. There is mild interstitial edema. The visualized skeletal structures are unremarkable. IMPRESSION: Mild congestive heart failure. Patchy consolidation of left lung base, pneumonia is not excluded. Small bilateral pleural effusions. Electronically Signed   By: Abelardo Diesel M.D.   On: 08/10/2017 17:49   [place] Lt Lower Extrem Ltd Soft Tissue Non Vascular  Result Date: 08/17/2017 CLINICAL DATA:  Baker cyst. EXAM: ULTRASOUND LEFT LOWER EXTREMITY LIMITED TECHNIQUE: Ultrasound examination of the lower extremity soft tissues was performed in the area of clinical concern. COMPARISON:  08/14/2017. FINDINGS: 3.6 x 1.7 x 5.0 cm complex cyst with thickened septations in the popliteal fossa. This is most likely a Baker cyst. Abscess cannot be completely excluded. IMPRESSION: 3.6 x 1.7 x 5.0 cm complex cyst noted in the popliteal fossa. This is most consistent with a baker cyst. Electronically Signed   By: [place]   On: 08/17/2017 12:57    Microbiology: Recent Results (from the past 240 hour(s))  MRSA PCR Screening     Status: None   Collection Time: 08/11/17  6:14 PM  Result Value Ref Range Status   MRSA by PCR NEGATIVE NEGATIVE Final    Comment:        The GeneXpert MRSA Assay (FDA approved for NASAL specimens only), is one component of a comprehensive MRSA colonization surveillance program. It is not intended to diagnose MRSA infection nor to guide or monitor treatment for MRSA infections.      Labs: Basic Metabolic Panel: Recent Labs  Lab 08/12/17 0428 08/13/17 0520 08/14/17 0419 08/15/17 0407 08/16/17 0720  NA 141 140 139 137 142  K 3.1* 3.0* 4.3 3.4* 3.5  CL 111 103 101 99* 106  CO2 21* 27 25 28 26   GLUCOSE 90 92 89 97 84  BUN 32* 28* 29* 26* 32*  CREATININE 1.40* 1.38* 1.44* 1.38* 1.35*  CALCIUM 8.4* 8.5* 8.9 8.6* 8.2*   CBC: Recent Labs  Lab 08/13/17 1531 08/14/17 0419 08/15/17 0407  08/16/17 0720  WBC 8.0 9.8 7.9 6.8  HGB 15.8 16.0 15.1 14.3  HCT 48.8 49.3 47.1 43.9  MCV 97.8 98.8 98.1 99.5  PLT 135* 148* 127* 109*    Recent Labs    12/06/16 0910 03/23/17 1422 08/10/17 2037  BNP 286.9* 2,357.2* 2,687.6*   CBG: Recent Labs  Lab 08/16/17 2347 08/17/17 0649 08/17/17 1824 08/18/17 0111 08/18/17 0633  GLUCAP 85 83 108* 107* 107*    Principal Problem:   Systolic CHF, acute on chronic (HCC) Active Problems:   Coronary artery disease   Mild cognitive impairment   Pharyngocutaneous fistula   Hallucination   CHF (congestive heart failure) (HCC)   Swelling of joint of left knee   Time coordinating discharge: 45 minutes  Signed:  Murray Hodgkins, MD [facility] 08/18/2017, 1:19 PM

## 2017-08-18 NOTE — Clinical Social Work Note (Signed)
Updated FL2 sent to [facility] and they can accept patient. Admissions coordinator will start [place]. Daughter aware. CSW paged MD to notify.  Dayton Scrape, [place]

## 2017-08-18 NOTE — Clinical Social Work Note (Signed)
[facility] has rescinded bed offer due to suctioning needs. CSW notified patient's daughter. Only other bed offer is [facility]. She still really wants [facility] or [facility] due to good experiences in the past. CSW send information again and left a voicemail for [facility], explaining that he no longer has telesitter and mentation is improving per RN.  Dayton Scrape, [place]

## 2017-08-18 NOTE — Progress Notes (Signed)
Physical Therapy Treatment Patient Details Name: Timothy Suarez MRN: [id number] DOB: 13-Nov-1924 Today's Date: 08/18/2017    History of Present Illness 84yo male with B LE edema, hallucinations, orthopnea, PND with old trach site which per MD note patient had been independent in self-suctioning at home. Dx with acute on chronic CHF, hallucinations. PMH A-fib, aortic valve insufficiency, cerbral ischemia, hx colon CA, CAD, HTN, PAF, thoracic AAA, hx cardiac cath, hx joint replacements, vocal cord CA, hx multiple tracheoesophageal surgeries     PT Comments    Pt performed increased gait during session this afternoon and required min to mod assistance during session.  Plan for SNF remains appropriate to improve strength and function.  Plan next session for continued gait and functional mobility training to improve safety and ease.     Follow Up Recommendations  SNF;Other (comment);Supervision/Assistance - 24 hour(if patient/family refuse SNF he will require a [place] aide and 24/7 assistance.  )     Equipment Recommendations  None recommended by PT    Recommendations for Other Services       Precautions / Restrictions Precautions Precautions: Fall Restrictions Weight Bearing Restrictions: No    Mobility  Bed Mobility Overal bed mobility: Needs Assistance Bed Mobility: Supine to Sit     Supine to sit: Min assist     General bed mobility comments: Pt able to progress B LEs to edge of bed but require min assistance to elevate trunk into sitting edge of bed.  Mod assist to scoot R hip forward to place B feet firmly on the floor.    Transfers Overall transfer level: Needs assistance Equipment used: Rolling walker (2 wheeled) Transfers: Sit to/from Stand Sit to Stand: Mod assist         General transfer comment: Pt required mod assist from edge of bed to elevate in standing.  Pt with posterior lean and increased time required for anterior translation and placement of B hand on RW  handgrips.    Ambulation/Gait Ambulation/Gait assistance: Min assist Ambulation Distance (Feet): 40 Feet Assistive device: Rolling walker (2 wheeled) Gait Pattern/deviations: Step-through pattern;Decreased stance time - left;Decreased step length - right;Narrow base of support;Shuffle   Gait velocity interpretation: Below normal speed for age/gender General Gait Details: Pt performed increased activity during session but remains to present with uneven gait pattern due to pain in L knee.  Pt noted to shuffle R foot forward vs. lifting.     Stairs            Wheelchair Mobility    Modified Rankin (Stroke Patients Only)       Balance Overall balance assessment: Needs assistance   Sitting balance-Leahy Scale: Good       Standing balance-Leahy Scale: Fair                              Cognition Arousal/Alertness: Awake/alert Behavior During Therapy: WFL for tasks assessed/performed Overall Cognitive Status: Difficult to assess                                        Exercises      General Comments        Pertinent Vitals/Pain Pain Assessment: Faces Faces Pain Scale: Hurts little more Pain Location: L knee due to chronic arthritis.   Pain Intervention(s): Monitored during session;Repositioned    Home Living  Prior Function            PT Goals (current goals can now be found in the care plan section) Acute Rehab PT Goals PT Goal Formulation: Patient unable to participate in goal setting Progress towards PT goals: Progressing toward goals    Frequency    Min 2X/week      PT Plan Current plan remains appropriate    Co-evaluation              AM-PAC PT "6 Clicks" Daily Activity  Outcome Measure  Difficulty turning over in bed (including adjusting bedclothes, sheets and blankets)?: Unable Difficulty moving from lying on back to sitting on the side of the bed? : Unable Difficulty  sitting down on and standing up from a chair with arms (e.g., wheelchair, bedside commode, etc,.)?: Unable Help needed moving to and from a bed to chair (including a wheelchair)?: A Little Help needed walking in hospital room?: A Little Help needed climbing 3-5 steps with a railing? : A Lot 6 Click Score: 11    End of Session Equipment Utilized During Treatment: Gait belt Activity Tolerance: Patient tolerated treatment well Patient left: in bed;with nursing/sitter in room;with call bell/phone within reach;with bed alarm set Nurse Communication: Mobility status PT Visit Diagnosis: Unsteadiness on feet (R26.81);Muscle weakness (generalized) (M62.81);Difficulty in walking, not elsewhere classified (R26.2);Other abnormalities of gait and mobility (R26.89)     Time: 2446-9507 PT Time Calculation (min) (ACUTE ONLY): 21 min  Charges:  $Gait Training: 8-22 mins                    G Codes:       Governor Rooks, PTA pager [place] 08/18/2017, 4:06 PM

## 2017-08-18 NOTE — NC FL2 (Signed)
[place] MEDICAID FL2 LEVEL OF CARE SCREENING TOOL     IDENTIFICATION  Patient Name: Timothy Suarez Birthdate: 02-03-1925 Sex: male Admission Date (Current Location): 08/10/2017  [facility] and [place] Number:  Herbalist and Address:  [facility], [place] [address], [place], [place] [address]      Provider Number: [id number]  Attending Physician Name and Address:  Samuella Cota, MD  Relative Name and Phone Number:       Current Level of Care: Hospital Recommended Level of Care: [place] Prior Approval Number:    Date Approved/Denied:   PASRR Number: [id number]  Discharge Plan: SNF    Current Diagnoses: Patient Active Problem List   Diagnosis Date Noted  . Swelling of joint of left knee   . Systolic CHF, acute on chronic ([place]) 08/10/2017  . Hallucination 08/10/2017  . CHF (congestive heart failure) ([place]) 08/10/2017  . Balance problems 07/08/2017  . Baker cyst, left 07/08/2017  . Dilated cardiomyopathy ([place]) 04/19/2017  . Chronic systolic congestive heart failure ([place]) 03/30/2017  . Cough   . Pulmonary edema 12/06/2016  . H/O laryngectomy 03/26/2016  . Protein-calorie malnutrition, severe 02/26/2016  . Pharyngocutaneous fistula 02/21/2016  . S/P laryngectomy 01/15/2016  . S/P biopsy 01/15/2016  . Baker's cyst of knee 12/09/2015  . Other fatigue 12/09/2015  . 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 Prostate cancer ([place]) 01/10/2015  . S/P MVR (mitral valve repair) 12/20/2014  . S/P Maze operation for atrial fibrillation 12/20/2014  . Obesity (BMI 30-39.9) 04/02/2014  . Prolonged Q-T interval on ECG 04/02/2014  . Mild cognitive impairment 04/02/2014  . Major depressive disorder, recurrent episode, mild ([place]) 02/08/2014  . Insomnia 02/08/2014  . Dizziness and giddiness 02/08/2014  . Generalized muscle weakness 02/08/2014  . Cerumen impaction 02/08/2014  . Glaucoma  02/08/2014  . Colon cancer ([place])   . Coronary artery disease   . Moderate aortic insufficiency 01/03/2013  . Mild mitral regurgitation 01/03/2013  . MVP (mitral valve prolapse)   . Paroxysmal atrial fibrillation (HCC)   . HTN (hypertension)   . Depression   . chest wall hernia (lung hernia)     Orientation RESPIRATION BLADDER Height & Weight     Self  Normal(Has a stoma. Suctioned BID or at least once per day.) Incontinent, External catheter Weight: 164 lb 7.4 oz (74.6 kg) Height:  5\' 11"  (180.3 cm)  BEHAVIORAL SYMPTOMS/MOOD NEUROLOGICAL BOWEL NUTRITION STATUS  (Calm, Cooperative.) (None) Continent Diet(2 gram sodium)  AMBULATORY STATUS COMMUNICATION OF NEEDS Skin   Limited Assist Verbally Bruising, Other (Comment)(Excoriated, Petechiae.)                       Personal Care Assistance Level of Assistance  Bathing, Feeding, Dressing Bathing Assistance: Limited assistance Feeding assistance: Limited assistance Dressing Assistance: Limited assistance     Functional Limitations Info  Sight, Hearing, Speech Sight Info: Adequate Hearing Info: Adequate Speech Info: Adequate    SPECIAL CARE FACTORS FREQUENCY  PT (By licensed PT), OT (By licensed OT)     PT Frequency: 5 x week OT Frequency: 5 x week            Contractures Contractures Info: Not present    Additional Factors Info  Code Status, Allergies, Psychotropic Code Status Info: Full Allergies Info: Ambien (Zolpidem), Citalopram, Trazodone And Nefazodone, Zoloft (Sertraline Hcl) Psychotropic Info: Depression, hallucinations: No psychotropic meds.  Current Medications (08/18/2017):  This is the current hospital active medication list Current Facility-Administered Medications  Medication Dose Route Frequency Provider Last Rate Last Dose  . 0.9 %  sodium chloride infusion  250 mL Intravenous PRN Elwyn Reach, MD      . acetaminophen (TYLENOL) tablet 650 mg  650 mg Oral Q4H PRN Elwyn Reach,  MD   650 mg at 08/14/17 0303  . aspirin EC tablet 81 mg  81 mg Oral Daily Gala Romney L, MD   81 mg at 08/18/17 1043  . atorvastatin (LIPITOR) tablet 40 mg  40 mg Oral Daily Gala Romney L, MD   40 mg at 08/18/17 1042  . furosemide (LASIX) tablet 40 mg  40 mg Oral Daily Minus Breeding, MD   40 mg at 08/18/17 1042  . lidocaine (PF) (XYLOCAINE) 1 % injection 5 mL  5 mL Intradermal Once Saverio Danker, PA-C      . lisinopril (PRINIVIL,ZESTRIL) tablet 2.5 mg  2.5 mg Oral Daily Gala Romney L, MD   2.5 mg at 08/18/17 1042  . ondansetron (ZOFRAN) injection 4 mg  4 mg Intravenous Q6H PRN Matcha, Anupama, MD   4 mg at 08/16/17 1223  . polyethylene glycol (MIRALAX / GLYCOLAX) packet 17 g  17 g Oral Daily Kinnie Feil, MD   17 g at 08/18/17 1043  . potassium chloride SA (K-DUR,KLOR-CON) CR tablet 20 mEq  20 mEq Oral Daily Matcha, Anupama, MD   20 mEq at 08/18/17 1043  . senna (SENOKOT) tablet 8.6 mg  1 tablet Oral Daily Kinnie Feil, MD   8.6 mg at 08/18/17 1042  . sodium chloride flush (NS) 0.9 % injection 3 mL  3 mL Intravenous Q12H Gala Romney L, MD   3 mL at 08/18/17 1043  . sodium chloride flush (NS) 0.9 % injection 3 mL  3 mL Intravenous PRN Elwyn Reach, MD      . traZODone (DESYREL) tablet 50 mg  50 mg Oral QHS PRN Neila Gear, NP   Stopped at 08/14/17 0256     Discharge Medications: Please see discharge summary for a list of discharge medications.  Relevant Imaging Results:  Relevant Lab Results:   Additional Information SS#: [id number]. From [place]. [facility] dc'ed yesterday afternoon.  Candie Chroman, LCSW

## 2017-08-19 ENCOUNTER — Telehealth: Payer: Self-pay

## 2017-08-19 DIAGNOSIS — Z9002 Acquired absence of larynx: Secondary | ICD-10-CM | POA: Diagnosis not present

## 2017-08-19 DIAGNOSIS — R7989 Other specified abnormal findings of blood chemistry: Secondary | ICD-10-CM | POA: Diagnosis not present

## 2017-08-19 DIAGNOSIS — E785 Hyperlipidemia, unspecified: Secondary | ICD-10-CM | POA: Diagnosis not present

## 2017-08-19 DIAGNOSIS — I724 Aneurysm of artery of lower extremity: Secondary | ICD-10-CM | POA: Diagnosis not present

## 2017-08-19 DIAGNOSIS — I340 Nonrheumatic mitral (valve) insufficiency: Secondary | ICD-10-CM | POA: Diagnosis not present

## 2017-08-19 DIAGNOSIS — D696 Thrombocytopenia, unspecified: Secondary | ICD-10-CM | POA: Diagnosis not present

## 2017-08-19 DIAGNOSIS — M7122 Synovial cyst of popliteal space [Baker], left knee: Secondary | ICD-10-CM | POA: Diagnosis not present

## 2017-08-19 DIAGNOSIS — J80 Acute respiratory distress syndrome: Secondary | ICD-10-CM | POA: Diagnosis not present

## 2017-08-19 DIAGNOSIS — M1990 Unspecified osteoarthritis, unspecified site: Secondary | ICD-10-CM | POA: Diagnosis not present

## 2017-08-19 DIAGNOSIS — I351 Nonrheumatic aortic (valve) insufficiency: Secondary | ICD-10-CM | POA: Diagnosis not present

## 2017-08-19 DIAGNOSIS — R946 Abnormal results of thyroid function studies: Secondary | ICD-10-CM | POA: Diagnosis not present

## 2017-08-19 DIAGNOSIS — R488 Other symbolic dysfunctions: Secondary | ICD-10-CM | POA: Diagnosis not present

## 2017-08-19 DIAGNOSIS — I5022 Chronic systolic (congestive) heart failure: Secondary | ICD-10-CM | POA: Diagnosis not present

## 2017-08-19 DIAGNOSIS — I5040 Unspecified combined systolic (congestive) and diastolic (congestive) heart failure: Secondary | ICD-10-CM | POA: Diagnosis not present

## 2017-08-19 DIAGNOSIS — I255 Ischemic cardiomyopathy: Secondary | ICD-10-CM | POA: Diagnosis not present

## 2017-08-19 DIAGNOSIS — N179 Acute kidney failure, unspecified: Secondary | ICD-10-CM | POA: Diagnosis not present

## 2017-08-19 DIAGNOSIS — I2510 Atherosclerotic heart disease of native coronary artery without angina pectoris: Secondary | ICD-10-CM | POA: Diagnosis not present

## 2017-08-19 DIAGNOSIS — I420 Dilated cardiomyopathy: Secondary | ICD-10-CM | POA: Diagnosis not present

## 2017-08-19 DIAGNOSIS — I5023 Acute on chronic systolic (congestive) heart failure: Secondary | ICD-10-CM | POA: Diagnosis not present

## 2017-08-19 DIAGNOSIS — N183 Chronic kidney disease, stage 3 (moderate): Secondary | ICD-10-CM | POA: Diagnosis not present

## 2017-08-19 DIAGNOSIS — M6281 Muscle weakness (generalized): Secondary | ICD-10-CM | POA: Diagnosis not present

## 2017-08-19 DIAGNOSIS — I130 Hypertensive heart and chronic kidney disease with heart failure and stage 1 through stage 4 chronic kidney disease, or unspecified chronic kidney disease: Secondary | ICD-10-CM | POA: Diagnosis not present

## 2017-08-19 DIAGNOSIS — G3184 Mild cognitive impairment, so stated: Secondary | ICD-10-CM | POA: Diagnosis not present

## 2017-08-19 DIAGNOSIS — J392 Other diseases of pharynx: Secondary | ICD-10-CM | POA: Diagnosis not present

## 2017-08-19 DIAGNOSIS — R443 Hallucinations, unspecified: Secondary | ICD-10-CM | POA: Diagnosis not present

## 2017-08-19 DIAGNOSIS — I739 Peripheral vascular disease, unspecified: Secondary | ICD-10-CM | POA: Diagnosis not present

## 2017-08-19 DIAGNOSIS — I708 Atherosclerosis of other arteries: Secondary | ICD-10-CM | POA: Diagnosis not present

## 2017-08-19 DIAGNOSIS — C320 Malignant neoplasm of glottis: Secondary | ICD-10-CM | POA: Diagnosis not present

## 2017-08-19 DIAGNOSIS — Z430 Encounter for attention to tracheostomy: Secondary | ICD-10-CM | POA: Diagnosis not present

## 2017-08-19 DIAGNOSIS — R262 Difficulty in walking, not elsewhere classified: Secondary | ICD-10-CM | POA: Diagnosis not present

## 2017-08-19 LAB — GLUCOSE, CAPILLARY: GLUCOSE-CAPILLARY: 119 mg/dL — AB (ref 65–99)

## 2017-08-19 NOTE — Clinical Social Work Note (Signed)
Patient has insurance approval to go to [facility] today. SNF admissions coordinator will contact patient's daughter about paperwork.  Dayton Scrape, [place]

## 2017-08-19 NOTE — Progress Notes (Signed)
  PROGRESS NOTE  Timothy Suarez NMM:[id number] DOB: 1925-05-03 DOA: 08/10/2017 PCP: Gayland Curry, DO  Assessment/Plan Acute on chronic systolic CHFsecondary to ischemic cardiomyopathy. LVEF 25-30%, stable. Not on outpatient meds except diuretics for low BP. - stable; down 6.1 liters since admit - continue low dose ACE-I and furosemide. No BB secondary to h/o bradycardia  Hallucinations superimposed on mild cognitive impairment. MRI brain and U/A unremarkable. - appear resolved\  Appears stable, no changes to d/c summary; can go to SNF today  Murray Hodgkins, MD  [facility] Direct contact: [phone] --Via amion app OR  --[email or web address]; password [email or web address]  7PM-7AM contact night coverage as above 08/19/2017, 10:33 AM  LOS: 9 days   Interval history/Subjective: Doing fine. No complaints. Brief SVT on tele.  Objective: Vitals:  Vitals:   08/18/17 2014 08/19/17 0512  BP: 112/66 (!) 110/43  Pulse: 65 (!) 52  Resp: 18 18  Temp: 98 F (36.7 C) 98 F (36.7 C)  SpO2: 98% 96%    Exam:  Constitutional:  . Appears calm and comfortable, reading newspaper Respiratory:  . CTA bilaterally, no w/r/r.  . Respiratory effort normal. N Cardiovascular:  . RRR with premature beats, no m/r/g . No LE extremity edema   Psychiatric:  . Mental status o Mood, affect appropriate  I have personally reviewed the following:   Labs:  CBG stable  Scheduled Meds: . aspirin EC  81 mg Oral Daily  . atorvastatin  40 mg Oral Daily  . furosemide  40 mg Oral Daily  . lidocaine (PF)  5 mL Intradermal Once  . lisinopril  2.5 mg Oral Daily  . polyethylene glycol  17 g Oral Daily  . potassium chloride  20 mEq Oral Daily  . senna  1 tablet Oral Daily  . sodium chloride flush  3 mL Intravenous Q12H   Continuous Infusions: . sodium chloride      Principal Problem:   Systolic CHF, acute on chronic (HCC) Active Problems:   Coronary artery disease   Mild cognitive impairment    Pharyngocutaneous fistula   Hallucination   CHF (congestive heart failure) (HCC)   Swelling of joint of left knee   LOS: 9 days

## 2017-08-19 NOTE — Telephone Encounter (Signed)
I have made the 1st attempt to contact the patient or family member in charge, in order to follow up from recently being discharged from the hospital. I left a message on voicemail but I will make another attempt at a different time.  

## 2017-08-19 NOTE — Progress Notes (Signed)
Report called to [facility] Patients VS stable for discharge. Daughter Hilda Blades notified of discharge.

## 2017-08-19 NOTE — Clinical Social Work Note (Signed)
CSW facilitated patient discharge including contacting patient family and facility to confirm patient discharge plans. Clinical information faxed to facility and family agreeable with plan. CSW arranged ambulance transport via [facility] to [facility]. RN to call report prior to discharge ([phone]).  CSW will sign off for now as social work intervention is no longer needed. Please consult us again if new needs arise.  Darrion Wyszynski, CSW [phone]   

## 2017-08-19 NOTE — Clinical Social Work Placement (Signed)
   CLINICAL SOCIAL WORK PLACEMENT  NOTE  Date:  08/19/2017  Patient Details  Name: Timothy Suarez MRN: [id number] Date of Birth: 1925-03-05  Clinical Social Work is seeking post-discharge placement for this patient at the [place] level of care (*CSW will initial, date and re-position this form in  chart as items are completed):  Yes   Patient/family provided with [place] Work Department's list of facilities offering this level of care within the geographic area requested by the patient (or if unable, by the patient's family).  Yes   Patient/family informed of their freedom to choose among providers that offer the needed level of care, that participate in Medicare, Medicaid or managed care program needed by the patient, have an available bed and are willing to accept the patient.  Yes   Patient/family informed of [facility]'s ownership interest in [facility] and [facility], as well as of the fact that they are under no obligation to receive care at these facilities.  PASRR submitted to EDS on 08/13/17     PASRR number received on       Existing PASRR number confirmed on 08/13/17     FL2 transmitted to all facilities in geographic area requested by pt/family on 08/13/17     FL2 transmitted to all facilities within larger geographic area on       Patient informed that his/her managed care company has contracts with or will negotiate with certain facilities, including the following:        Yes   Patient/family informed of bed offers received.  Patient chooses bed at [facility] recommends and patient chooses bed at      Patient to be transferred to [facility] on 08/19/17.  Patient to be transferred to facility by [facility]     Patient family notified on 08/19/17 of transfer.  Name of family member notified:  [name]     PHYSICIAN Please prepare prescriptions     Additional  Comment:    _______________________________________________ Candie Chroman, LCSW 08/19/2017, 10:35 AM

## 2017-08-19 NOTE — Progress Notes (Signed)
Patient had 13 beats of wide QRS. Patient asymptomatic, asleep. Paged NP on call [place] and made him aware.   Will continue to monitor.  Sherran Margolis, RN

## 2017-08-20 ENCOUNTER — Encounter: Payer: Self-pay | Admitting: Adult Health

## 2017-08-20 NOTE — Progress Notes (Signed)
This encounter was created in error - please disregard.

## 2017-08-20 NOTE — Telephone Encounter (Signed)
This is a patient of [place], who was admitted to [facility] after hospitalization. [facility] F/U is needed. Hospital discharge from [facility] on 08/19/2017

## 2017-08-25 ENCOUNTER — Encounter: Payer: Self-pay | Admitting: Internal Medicine

## 2017-08-25 ENCOUNTER — Non-Acute Institutional Stay (SKILLED_NURSING_FACILITY): Payer: Medicare Other | Admitting: Internal Medicine

## 2017-08-25 DIAGNOSIS — R443 Hallucinations, unspecified: Secondary | ICD-10-CM

## 2017-08-25 DIAGNOSIS — R7989 Other specified abnormal findings of blood chemistry: Secondary | ICD-10-CM | POA: Diagnosis not present

## 2017-08-25 DIAGNOSIS — I5023 Acute on chronic systolic (congestive) heart failure: Secondary | ICD-10-CM

## 2017-08-25 DIAGNOSIS — I739 Peripheral vascular disease, unspecified: Secondary | ICD-10-CM | POA: Diagnosis not present

## 2017-08-25 DIAGNOSIS — E43 Unspecified severe protein-calorie malnutrition: Secondary | ICD-10-CM | POA: Diagnosis not present

## 2017-08-25 DIAGNOSIS — D696 Thrombocytopenia, unspecified: Secondary | ICD-10-CM | POA: Diagnosis not present

## 2017-08-25 NOTE — Progress Notes (Signed)
NURSING HOME LOCATION:  [facility] ROOM NUMBER:  [id number]  CODE STATUS:  Full Code  PCP:  Gayland Curry, DO  [place] [address]   This is a comprehensive admission note to [facility] performed on this date less than 30 days from date of admission. Included are preadmission medical/surgical history;reconciled medication list; family history; social history and comprehensive review of systems.  Corrections and additions to the records were documented . Comprehensive physical exam was also performed. Additionally a clinical summary was entered for each active diagnosis pertinent to this admission in the Problem List to enhance continuity of care.  HPI: Patient was hospitalized 1/1-08/18/17 for mental status changes with hallucinations associated with weakness, weight gain, and progressive edema. Cardiology directed aggressive diuresis.  His altered mental status waxed and waned requiring a sitter 1/8 He was diagnosed to have acute on chronic systolic heart failure secondary to ischemic cardiomyopathy with EF 20-25%. With aggressive diuresis the patient lost 6.5 L . As in the past the patient was intolerant to beta blockers due to hypotension. Low-dose ACE inhibitor and furosemide were continued. The hallucinations were superimposed on baseline mild cognitive impairment. Urine did not suggest UTI. MRI of the brain was unremarkable. Mental status changes and hallucinations were attributed to the acute illness. Radiographically pneumonia was present for which he received 5 days of antibiotics. Thrombocytopenia was present on admission and was stable. He was not on anticoagulants. Acute on chronic kidney disease stage III was clinically stable and improved with diuresis. The patient did have a left knee Baker's cyst. Extensive atherosclerotic calcification of the popliteal artery was present on imaging. The possibility of a popliteal artery aneurysm considered.  Outpatient arterial ultrasound was recommended. TSH was elevated at 7.512. Repeat as an outpatient was recommended. Predischarge labs revealed BUN 32, creatinine 1.35, calcium 8.2, and platelet count 109,000.  Past medical and surgical history: Includes atrial fibrillation, colon cancer, vocal cord cancer, thoracic aortic aneurysm,  & prostate cancer He has had a partial colectomy and salvage laryngectomy. That was complicated  by pharyngocutaneous fistula.  Social history: The patient has never smoked. He drinks alcoholic beverages rarely.  Family history: Reviewed  Review of systems: Completion was difficult due to his compromised vocal state, but clinically he seemed to comprehend the questions and he followed commands well. He was able to communicate that his hearing aids were in the bedside table. He denies any active cardiopulmonary symptoms except for edema. He specifically denies paroxysmal nocturnal dyspnea. He denies any significant symptoms of hypothyroidism. He denies any history of thyroid disease.  Constitutional: No fever,significant weight change, fatigue  Eyes: No redness, discharge, pain, vision change ENT/mouth: No nasal congestion,  purulent discharge, earache,change in hearing ,sore throat  Cardiovascular: No chest pain, palpitations,paroxysmal nocturnal dyspnea, claudication  Respiratory: No cough, sputum production,hemoptysis, DOE , significant snoring,apnea  Gastrointestinal: No heartburn,dysphagia,abdominal pain, nausea / vomiting,rectal bleeding, melena,change in bowels Genitourinary: No dysuria,hematuria, pyuria,  incontinence, nocturia Musculoskeletal: No joint stiffness, joint swelling, weakness,pain Dermatologic: No rash, pruritus, change in appearance of skin Neurologic: No dizziness,headache,syncope, seizures, numbness , tingling Psychiatric: No significant anxiety , depression, insomnia, anorexia Endocrine: No change in hair/skin/ nails, excessive thirst,  excessive hunger, excessive urination  Hematologic/lymphatic: No significant bruising, lymphadenopathy,abnormal bleeding Allergy/immunology: No itchy/ watery eyes, significant sneezing, urticaria, angioedema  Physical exam:  Pertinent or positive findings: He appears younger than his stated age. Pattern alopecia is present. He is hard of hearing, he is not wearing his  hearing aids. His vocal responses are almost a raspy whisper and difficult to discern. There is intermittent gurgling type rhonchi over the laryngeal area. Heart sounds are distant, irregular and slow. Breath sounds are decreased. There is transmission of the laryngeal sounds into the chest. This xiphoid is prominent. He has 1-1.5+ pitting edema at the sock line. The pulses are decreased, particularly the dorsalis pedis pulses. Baker's cyst is present in the left popliteal space. Fusiform changes of the knees are present. There is a well-healed operative scar over the right knee. He has DIP arthritic changes as well as fusiform changes of the PIP joints. There is interosseous wasting. There is a well-healed operative scar over the distal nose.  General appearance:Adequately nourished; no acute distress , increased work of breathing is present.   Lymphatic: No lymphadenopathy about the head, neck, axilla . Eyes: No conjunctival inflammation or lid edema is present. There is no scleral icterus. Ears:  External ear exam shows no significant lesions or deformities.   Nose:  External nasal examination shows no deformity or inflammation. Nasal mucosa are pink and moist without lesions ,exudates Oral exam: lips and gums are healthy appearing.There is no oropharyngeal erythema or exudate . Neck:  No  masses, tenderness. Open trach as noted.    Heart:  No gallop, murmur, click, rub .  Lungs: without wheezes, rales , rubs. Abdomen:Bowel sounds are normal. Abdomen is soft and nontender with no organomegaly, hernias,masses. GU: deferred    Extremities:  No cyanosis, clubbing  Neurologic exam : Strength equal  in upper & lower extremities Skin: Warm & dry w/o tenting. No significant lesions or rash.  See clinical summary under each active problem in the Problem List with associated updated therapeutic plan

## 2017-08-26 ENCOUNTER — Encounter: Payer: Self-pay | Admitting: Internal Medicine

## 2017-08-26 DIAGNOSIS — R7989 Other specified abnormal findings of blood chemistry: Secondary | ICD-10-CM | POA: Insufficient documentation

## 2017-08-26 DIAGNOSIS — I739 Peripheral vascular disease, unspecified: Secondary | ICD-10-CM | POA: Insufficient documentation

## 2017-08-26 DIAGNOSIS — D696 Thrombocytopenia, unspecified: Secondary | ICD-10-CM | POA: Insufficient documentation

## 2017-08-26 NOTE — Assessment & Plan Note (Signed)
Nutritional consult for protein supplementation This may help the peripheral edema

## 2017-08-26 NOTE — Assessment & Plan Note (Signed)
Intolerant to beta blockers

## 2017-08-26 NOTE — Assessment & Plan Note (Addendum)
08/26/17 patient not on anticoagulants, no active bleeding dyscrasia present Recheck CBC if bleeding occurs

## 2017-08-26 NOTE — Assessment & Plan Note (Signed)
Cardiology outpatient follow-up

## 2017-08-26 NOTE — Patient Instructions (Addendum)
See assessment and plan under each diagnosis in the problem list and acutely for this visit ?Total time 53 minutes; greater than 50% of the visit spent counseling patient and coordinating care for problems addressed at this encounter ? ?

## 2017-08-26 NOTE — Assessment & Plan Note (Signed)
Arterial ultrasound left lower extremity as per cardiology

## 2017-08-26 NOTE — Assessment & Plan Note (Signed)
QT interval > 440 msec for men Meds which can increase  QT interval.   : Verapamil, Biaxin , Z pack, Erythromycin, Fluoroquinolones (Levaquin, Cipro ),Citalopram , Lexapro, &  Amitriptyline.   Paxil, Sertraline , Prozac , & Venlefexine are not associated with this phenomenon.

## 2017-08-26 NOTE — Assessment & Plan Note (Signed)
Full thyroid panel Risk versus benefits of treating hypothyroidism will be assessed Lowest TSH goal would be 4.5-5.5

## 2017-08-27 LAB — TSH: TSH: 5.26 (ref 0.41–5.90)

## 2017-09-01 NOTE — Progress Notes (Signed)
Cardiology Office Note:    Date:  09/02/2017   ID:  Timothy [facility], DOB Feb 10, 1925, MRN [id number]  PCP:  Gayland Curry, DO  Cardiologist:  Fransico Him, MD    Referring MD: Gayland Curry, DO   Chief Complaint  Patient presents with  . Atrial Fibrillation  . Mitral Regurgitation  . Hypertension  . Coronary Artery Disease    History of Present Illness:    Timothy [facility] is a 84 y.o. male with a hx of PAF, MVP with severe MR s/p repair, HTN, nonobstructive CAD, colon cancer, chronic systolic HF. He was admitted to [facility] a few weeks ago with AMS and LE edema and was diagnosed with acute on chronic systolic CHF.  He was started on IV diuretics and diuresed well.  He was started on carvedilol and lisinopril but due to bradycardia the carvedilol was stopped.  Repeat 2D echo showed a decrease in EF from 25-30% down to 20-25%.  He did have some 2nd degree AV block type I and type 2 while hospitalized and BB stopped.    He is here today for followup and is doing well.  He is now at [facility] and is in PT.  He denies any chest pain or pressure, PND, orthopnea,  dizziness, palpitations or syncope. He has chronic LE edema that has improved significantly.  He has been wearing compression hose at [facility] but does not have them on today . He has some mild SOB due to his stoma that he has post laryngectomy for pharyngocutaneous fistula and vocal cord CA.  He is compliant with his meds and is tolerating meds with no SE.  His daughter is with him today and is concerned that he might get CHF again and had a lot of questions related to how to prevent CHF exacerbations in the future.    Past Medical History:  Diagnosis Date  . A-fib (Timothy [facility])   . Aortic valve insufficiency, acquired   . Arthritis   . Cerebral ischemia   . Colon cancer [facility])     s/p partial colectomy  . Complication of anesthesia    "woke up w/confusion and hallucinations once after mitral valve OR"  . Coronary artery disease      50-70% LAD mitral valve repair 4+ yrs ago  . Glaucoma   . Hernia   . HTN (hypertension)    takes Amlodipine daily  . Hyperlipidemia   . Major depression   . Mild cognitive impairment   . MVP (mitral valve prolapse)    S/P Rt mini thoractomy for Mitral Valve repair  . Paroxysmal atrial fibrillation (HCC)    S/P Maze procedure  . 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 Prostate cancer ([facility])   . 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 Sleep disturbance   . Thoracic aortic aneurysm [facility])     Past Surgical History:  Procedure Laterality Date  . CATARACT EXTRACTION W/ INTRAOCULAR LENS  IMPLANT, BILATERAL Bilateral   . COLECTOMY  2004   Dr [name]  . DIRECT LARYNGOSCOPY N/A 01/15/2016   Procedure: DIRECT LARYNGOSCOPY WITH BIOPSY AND FROZEN SECTION;  Surgeon: Izora Gala, MD;  Location: [place];  Service: ENT;  Laterality: N/A;  . FlexHD patch repair of chest wall hernia.  01/28/2011   [name]  . GASTROSTOMY W/ FEEDING TUBE    . IR GENERIC HISTORICAL  10/08/2016   IR GASTROSTOMY TUBE REMOVAL 10/08/2016 Jenny Reichmann  Pascal Lux, MD [facility]-INTERV RAD  . JOINT REPLACEMENT    . LARYNGETOMY N/A 01/15/2016   Procedure:  TOTAL LARYNGECTOMY;  Surgeon: Izora Gala, MD;  Location: [facility] [place] [facility];  Service: ENT;  Laterality: N/A;  . MAZE  12/27/2008   left side lesion set  . MICROLARYNGOSCOPY Right 01/29/2015   Procedure: MICROLARYNGOSCOPY WITH BIOSPY OF RIGHT VOCAL CORD;  Surgeon: Izora Gala, MD;  Location: [place];  Service: ENT;  Laterality: Right;  . MITRAL VALVE REPAIR  12/27/2008   complex valvuloplasty with 9mm Memo 3D annuloplasty ring via right minithoracotomy  . PECTORALIS FLAP Left 05/04/2016   Procedure: PECTORALIS FLAP to neck with possible, split thickness skin graft;  Surgeon: Loel Lofty Dillingham, DO;  Location: [place];  Service: Plastics;  Laterality: Left;  . PROSTATE BIOPSY    . SKIN SPLIT GRAFT Left 05/04/2016   Procedure: PECTORALIS MAJOR  MYOCUTANEOUS FLAP RECONSTRUCTION OF PHARYNX AND SPLIT THICKNESS SKIN GRAFT;  Surgeon: Izora Gala, MD;  Location: [place];  Service: ENT;  Laterality: Left;  . TEE WITHOUT CARDIOVERSION N/A 01/03/2013   Procedure: TRANSESOPHAGEAL ECHOCARDIOGRAM (TEE);  Surgeon: Sueanne Margarita, MD;  Location: [place];  Service: Cardiovascular;  Laterality: N/A;  . TOTAL KNEE ARTHROPLASTY Right 2003  . TRACHEAL ESOPHAGEAL PUNCTURE REPAIR N/A 03/26/2016   Procedure: TRACHEAL ESOPHAGEAL PUNCTURE;  Surgeon: Izora Gala, MD;  Location: [place];  Service: ENT;  Laterality: N/A;  . TRACHEOESOPHAGEAL FISTULA REPAIR N/A 03/26/2016   Procedure: TRACHEO-ESOPHAGEAL   PUNCTURE,FISTULAR CLOSURE;  Surgeon: Izora Gala, MD;  Location: [place];  Service: ENT;  Laterality: N/A;  . TRACHEOESOPHAGEAL FISTULA REPAIR N/A 04/09/2016   Procedure: FISTULA REPAIR WITH  MUSCLE ROTATION FLAP;  Surgeon: Izora Gala, MD;  Location: [place];  Service: ENT;  Laterality: N/A;  . TRACHEOESOPHAGEAL FISTULA REPAIR N/A 07/27/2016   Procedure: CLOSURE OF FISTULA;  Surgeon: Izora Gala, MD;  Location: [place];  Service: ENT;  Laterality: N/A;  . TYMPANOPLASTY  1967   "? side"    Current Medications: Current Meds  Medication Sig  . atorvastatin (LIPITOR) 40 MG tablet Take 1 tablet (40 mg total) by mouth daily.  . furosemide (LASIX) 40 MG tablet TAKE ONE TABLET BY MOUTH DAILY  . lisinopril (PRINIVIL,ZESTRIL) 2.5 MG tablet Take 1 tablet (2.5 mg total) by mouth daily.     Allergies:   Ambien [zolpidem]; Metoprolol; Citalopram; Trazodone and nefazodone; and Zoloft [sertraline hcl]   Social History   Socioeconomic History  . Marital status: Widowed    Spouse name: None  . Number of children: 2  . Years of education: 12  . Highest education level: None  Social Needs  . Financial resource strain: None  . Food insecurity - worry: None  . Food insecurity - inability: None  . Transportation needs - medical: None  . Transportation needs - non-medical:  None  Occupational History  . Occupation: retired Nutritional therapist   Tobacco Use  . Smoking status: Never Smoker  . Smokeless tobacco: Never Used  Substance and Sexual Activity  . Alcohol use: Yes    Alcohol/week: 0.0 oz    Comment: rare  . Drug use: No  . Sexual activity: No  Other Topics Concern  . None  Social History Narrative   Widowed   Never smoked   Alcohol  Occasionally    Exercise - walking the [employer] retail with cane   [employer] [place]  Family History: The patient's family history includes Cancer in his father and mother. There is no history of Heart attack or Stroke.  ROS:   Please see the history of present illness.    ROS  All other systems reviewed and negative.   EKGs/Labs/Other Studies Reviewed:    The following studies were reviewed today: [facility] ontes  EKG:  EKG is not ordered today.    Recent Labs: 12/06/2016: Magnesium 2.1 08/10/2017: ALT 15; B Natriuretic Peptide 2,687.6 08/16/2017: BUN 32; Creatinine, Ser 1.35; Hemoglobin 14.3; Platelets 109; Potassium 3.5; Sodium 142 08/27/2017: TSH 5.26   Recent Lipid Panel    Component Value Date/Time   CHOL 141 12/21/2016 0949   CHOL 144 08/26/2015 1042   TRIG 62 12/21/2016 0949   HDL 52 12/21/2016 0949   HDL 49 08/26/2015 1042   CHOLHDL 2.7 12/21/2016 0949   VLDL 12 12/21/2016 0949   LDLCALC 77 12/21/2016 0949   LDLCALC 68 08/26/2015 1042    Physical Exam:    VS:  BP 110/60   Pulse 60   Ht 6' (1.829 m)   Wt 179 lb (81.2 kg)   SpO2 98%   BMI 24.28 kg/m     Wt Readings from Last 3 Encounters:  09/02/17 179 lb (81.2 kg)  08/25/17 178 lb 3.2 oz (80.8 kg)  08/20/17 178 lb 3.2 oz (80.8 kg)     GEN:  Well nourished, well developed in no acute distress HEENT: Normal NECK: No JVD; No carotid bruits LYMPHATICS: No lymphadenopathy CARDIAC: RRR, no murmurs, rubs, gallops RESPIRATORY:  Clear to auscultation without rales, wheezing or rhonchi  ABDOMEN: Soft,  non-tender, non-distended MUSCULOSKELETAL:  1+ edema; No deformity  SKIN: Warm and dry NEUROLOGIC:  Alert and oriented x 3 PSYCHIATRIC:  Normal affect   ASSESSMENT:    1. Coronary artery disease involving native coronary artery of native heart without angina pectoris   2. Chronic systolic congestive heart failure ([place])   3. Dilated cardiomyopathy ([place])   4. Mild mitral regurgitation   5. Moderate aortic insufficiency   6. Essential hypertension   7. Paroxysmal atrial fibrillation (HCC)    PLAN:    In order of problems listed above:  1.  ASCAD- moderate nonobstructive CAD by cath with 50-70% LAD at time of MV repair.  He denies any anginal symptoms.  His EF has declined some from prior echo.  This is out of proportion to prior CAD.  We discussed further workup of this today and I recommended continued medical therapy given his advanced age, laryngeal CA and other comorbidities.  He is not a surgical candidate.  2.  Chronic systolic CHF - he appears mildly volume overloaded on exam today.  He has LE edema but also does not have his compression hose on.  His weight is stable from last fall at 179lbs.  I have recommended that we increase Lasix to 40mg  qam and 20mg  qpm and repeat a BMET in 1 week.  He will continue on low dose ACE I.  He is intolerant to BB due to bradycardia and heart block in the past.  Could consider addition of aldactone or change to [facility] if he continues to have problems with volume overload. His soft BP precludes any uptitration of ACE I.  3.  DCM- EF 20-25% by recent echo which is down from prior echo in 2017 but similar to echo last Spring.  Given advanced age and comorbities would not pursue ischemic workup at this time and treat conservatively with  medication for HR since he is not having any anginal sx.  This was discussed with patient and his daughter.  4.  S/P MV repair with Mild to moderate MR by recent echo   5.  Moderate AR - this has progressed from mild to  moderate in the past year.  6.  HTN - BP is well controlled on exam today.  He will continue on low dose ACE I.  7.  Paroxymal atrial fibrillation s/p Maze at time of his MV repair.  He has not had any reoccurrence.   Medication Adjustments/Labs and Tests Ordered: Current medicines are reviewed at length with the patient today.  Concerns regarding medicines are outlined above.  No orders of the defined types were placed in this encounter.  No orders of the defined types were placed in this encounter.   Signed, Fransico Him, MD  09/02/2017 9:30 AM    [place]

## 2017-09-02 ENCOUNTER — Encounter: Payer: Self-pay | Admitting: Cardiology

## 2017-09-02 ENCOUNTER — Ambulatory Visit: Payer: Medicare Other | Admitting: Cardiology

## 2017-09-02 VITALS — BP 110/60 | HR 60 | Ht 72.0 in | Wt 179.0 lb

## 2017-09-02 DIAGNOSIS — I5022 Chronic systolic (congestive) heart failure: Secondary | ICD-10-CM | POA: Diagnosis not present

## 2017-09-02 DIAGNOSIS — I480 Paroxysmal atrial fibrillation: Secondary | ICD-10-CM

## 2017-09-02 DIAGNOSIS — I10 Essential (primary) hypertension: Secondary | ICD-10-CM

## 2017-09-02 DIAGNOSIS — I351 Nonrheumatic aortic (valve) insufficiency: Secondary | ICD-10-CM

## 2017-09-02 DIAGNOSIS — I2510 Atherosclerotic heart disease of native coronary artery without angina pectoris: Secondary | ICD-10-CM

## 2017-09-02 DIAGNOSIS — I420 Dilated cardiomyopathy: Secondary | ICD-10-CM | POA: Diagnosis not present

## 2017-09-02 DIAGNOSIS — I340 Nonrheumatic mitral (valve) insufficiency: Secondary | ICD-10-CM | POA: Diagnosis not present

## 2017-09-02 MED ORDER — FUROSEMIDE 40 MG PO TABS: ORAL_TABLET | ORAL | 3 refills | Status: DC

## 2017-09-02 NOTE — Patient Instructions (Signed)
Medication Instructions:  Your physician has recommended you make the following change in your medication  INCREASE: Lasix 40 mg (1tab) in the morning and 20 mg (0.5 tab) in the evening   Labwork: Your physician recommends that you schedule a follow-up appointment in: 1 week for a kidney function test    Testing/Procedures: None ordered   Follow-Up: Your physician recommends that you schedule a follow-up appointment in: 3 months with PA  Your physician wants you to follow-up in: 6 months with Dr. Radford Pax. You will receive a reminder letter in the mail two months in advance. If you don't receive a letter, please call our office to schedule the follow-up appointment.   Any Other Special Instructions Will Be Listed Below (If Applicable).  Your physician recommends you wear compression hose daily during the day.    If you need a refill on your cardiac medications before your next appointment, please call your pharmacy.

## 2017-09-06 ENCOUNTER — Encounter: Payer: Self-pay | Admitting: Internal Medicine

## 2017-09-06 DIAGNOSIS — R4189 Other symptoms and signs involving cognitive functions and awareness: Secondary | ICD-10-CM | POA: Insufficient documentation

## 2017-09-09 ENCOUNTER — Other Ambulatory Visit: Payer: Medicare Other | Admitting: *Deleted

## 2017-09-09 DIAGNOSIS — I5022 Chronic systolic (congestive) heart failure: Secondary | ICD-10-CM

## 2017-09-09 DIAGNOSIS — I2510 Atherosclerotic heart disease of native coronary artery without angina pectoris: Secondary | ICD-10-CM

## 2017-09-09 LAB — BASIC METABOLIC PANEL
BUN/Creatinine Ratio: 23 (ref 10–24)
BUN: 24 mg/dL (ref 10–36)
CO2: 24 mmol/L (ref 20–29)
CREATININE: 1.06 mg/dL (ref 0.76–1.27)
Calcium: 8.7 mg/dL (ref 8.6–10.2)
Chloride: 100 mmol/L (ref 96–106)
GFR calc Af Amer: 71 mL/min/{1.73_m2} (ref >59)
GFR calc non Af Amer: 61 mL/min/{1.73_m2} (ref >59)
GLUCOSE: 98 mg/dL (ref 65–99)
Potassium: 4.1 mmol/L (ref 3.5–5.2)
SODIUM: 140 mmol/L (ref 134–144)

## 2017-09-12 DIAGNOSIS — I480 Paroxysmal atrial fibrillation: Secondary | ICD-10-CM | POA: Diagnosis not present

## 2017-09-12 DIAGNOSIS — I130 Hypertensive heart and chronic kidney disease with heart failure and stage 1 through stage 4 chronic kidney disease, or unspecified chronic kidney disease: Secondary | ICD-10-CM | POA: Diagnosis not present

## 2017-09-12 DIAGNOSIS — I5033 Acute on chronic diastolic (congestive) heart failure: Secondary | ICD-10-CM | POA: Diagnosis not present

## 2017-09-12 DIAGNOSIS — Z9181 History of falling: Secondary | ICD-10-CM | POA: Diagnosis not present

## 2017-09-12 DIAGNOSIS — M6281 Muscle weakness (generalized): Secondary | ICD-10-CM | POA: Diagnosis not present

## 2017-09-12 DIAGNOSIS — I739 Peripheral vascular disease, unspecified: Secondary | ICD-10-CM | POA: Diagnosis not present

## 2017-09-12 DIAGNOSIS — Z85038 Personal history of other malignant neoplasm of large intestine: Secondary | ICD-10-CM | POA: Diagnosis not present

## 2017-09-12 DIAGNOSIS — N183 Chronic kidney disease, stage 3 (moderate): Secondary | ICD-10-CM | POA: Diagnosis not present

## 2017-09-12 DIAGNOSIS — R262 Difficulty in walking, not elsewhere classified: Secondary | ICD-10-CM | POA: Diagnosis not present

## 2017-09-12 DIAGNOSIS — Z8521 Personal history of malignant neoplasm of larynx: Secondary | ICD-10-CM | POA: Diagnosis not present

## 2017-09-12 DIAGNOSIS — E43 Unspecified severe protein-calorie malnutrition: Secondary | ICD-10-CM | POA: Diagnosis not present

## 2017-09-16 ENCOUNTER — Ambulatory Visit (INDEPENDENT_AMBULATORY_CARE_PROVIDER_SITE_OTHER): Payer: Medicare Other | Admitting: Internal Medicine

## 2017-09-16 ENCOUNTER — Encounter: Payer: Self-pay | Admitting: Internal Medicine

## 2017-09-16 VITALS — BP 128/64 | HR 61 | Temp 98.6°F | Ht 72.0 in | Wt 172.4 lb

## 2017-09-16 DIAGNOSIS — E43 Unspecified severe protein-calorie malnutrition: Secondary | ICD-10-CM | POA: Diagnosis not present

## 2017-09-16 DIAGNOSIS — M7122 Synovial cyst of popliteal space [Baker], left knee: Secondary | ICD-10-CM | POA: Diagnosis not present

## 2017-09-16 DIAGNOSIS — Z9002 Acquired absence of larynx: Secondary | ICD-10-CM | POA: Diagnosis not present

## 2017-09-16 DIAGNOSIS — R443 Hallucinations, unspecified: Secondary | ICD-10-CM | POA: Diagnosis not present

## 2017-09-16 DIAGNOSIS — Z7189 Other specified counseling: Secondary | ICD-10-CM | POA: Diagnosis not present

## 2017-09-16 DIAGNOSIS — I5023 Acute on chronic systolic (congestive) heart failure: Secondary | ICD-10-CM

## 2017-09-16 DIAGNOSIS — R7989 Other specified abnormal findings of blood chemistry: Secondary | ICD-10-CM

## 2017-09-16 NOTE — Progress Notes (Signed)
Location:  [facility] clinic Provider: Tyrina Hines L. Mariea Clonts, D.O., C.M.D.  Code Status: previously told me DNR, but now he's not sure with his daughter present.  Will discuss again in the future.  His daughter says they will discuss it between now and next appt.  Goals of Care:  Advanced Directives 09/16/2017  Does Patient Have a Medical Advance Directive? Yes  Type of Advance Directive Living will  Does patient want to make changes to medical advance directive? No - Patient declined  Copy of Sophia in Chart? -  Pre-existing out of facility DNR order (yellow form or pink MOST form) -     Chief Complaint  Patient presents with  . Transitions Of Care    Discharged from [place] [facility]; 09/09/2017(7 days)  . ACP    Living Will    HPI: Patient is a 84 y.o. male seen today for hospital and rehab follow-up s/p admission from 1/1-1/31/19.  He thought his knee was the reason for his swelling, but his daughter went over there and saw his toes were swollen like little balloons and she took him to the hospital.  He was hospitalized from 08/10/17 to 08/19/17 and then went to [place] from 08/19/17 to 09/09/17.    Worked with home healthcare a little already and will having nursing come in twice a week, PT and OT (OT comes Friday).    2 days ago was real bad and 2 days have been really good, he says, but his daughter disagrees.  He says he's not remembering his days.  He was really confused.  Gets delirious especially when hospitalized or in rehab, but then his duaghter says that comes and goes anyway.  He had hallucinations in th ehospital.  He was needing a sitter there.  He was sleeping every day in the daytime until one day he was awake and in shock he'd been at the hospital a week.  Then ok from that point forward.   Last Friday he had hallucinations at home.  He's had some off and on with that, too.  Wt down to 172 again.  Weight had gone up to 178 lbs.  He dropped down to  164 at hospital discharge.  Last Monday, he was 169 lbs per his daughter.  He is to check his weight daily and text her with it.  His eating has not changed.    Past Medical History:  Diagnosis Date  . A-fib ([place])   . Aortic valve insufficiency, acquired   . Arthritis   . Cerebral ischemia   . Colon cancer [facility])     s/p partial colectomy  . Complication of anesthesia    "woke up w/confusion and hallucinations once after mitral valve OR"  . Coronary artery disease    50-70% LAD mitral valve repair 4+ yrs ago  . Glaucoma   . Hernia   . HTN (hypertension)    takes Amlodipine daily  . Hyperlipidemia   . Major depression   . Mild cognitive impairment   . MVP (mitral valve prolapse)    S/P Rt mini thoractomy for Mitral Valve repair  . Paroxysmal atrial fibrillation (HCC)    S/P Maze procedure  . 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 Prostate cancer ([place])   . 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 Sleep disturbance   . Thoracic aortic aneurysm [facility])     Past Surgical  History:  Procedure Laterality Date  . CATARACT EXTRACTION W/ INTRAOCULAR LENS  IMPLANT, BILATERAL Bilateral   . COLECTOMY  2004   Dr [name]  . DIRECT LARYNGOSCOPY N/A 01/15/2016   Procedure: DIRECT LARYNGOSCOPY WITH BIOPSY AND FROZEN SECTION;  Surgeon: Izora Gala, MD;  Location: [place];  Service: ENT;  Laterality: N/A;  . FlexHD patch repair of chest wall hernia.  01/28/2011   [name]  . GASTROSTOMY W/ FEEDING TUBE    . IR GENERIC HISTORICAL  10/08/2016   IR GASTROSTOMY TUBE REMOVAL 10/08/2016 Sandi Mariscal, MD [facility]-INTERV RAD  . JOINT REPLACEMENT    . LARYNGETOMY N/A 01/15/2016   Procedure:  TOTAL LARYNGECTOMY;  Surgeon: Izora Gala, MD;  Location: [facility] OR;  Service: ENT;  Laterality: N/A;  . MAZE  12/27/2008   left side lesion set  . MICROLARYNGOSCOPY Right 01/29/2015   Procedure: MICROLARYNGOSCOPY WITH BIOSPY OF RIGHT VOCAL CORD;  Surgeon: Izora Gala,  MD;  Location: [place];  Service: ENT;  Laterality: Right;  . MITRAL VALVE REPAIR  12/27/2008   complex valvuloplasty with 47m Memo 3D annuloplasty ring via right minithoracotomy  . PECTORALIS FLAP Left 05/04/2016   Procedure: PECTORALIS FLAP to neck with possible, split thickness skin graft;  Surgeon: CLoel LoftyDillingham, DO;  [place]  Service: Plastics;  Laterality: Left;  . PROSTATE BIOPSY    . SKIN SPLIT GRAFT Left 05/04/2016   Procedure: PECTORALIS MAJOR MYOCUTANEOUS FLAP RECONSTRUCTION OF PHARYNX AND SPLIT THICKNESS SKIN GRAFT;  Surgeon: JIzora Gala MD;  [place]  Service: ENT;  Laterality: Left;  . TEE WITHOUT CARDIOVERSION N/A 01/03/2013   Procedure: TRANSESOPHAGEAL ECHOCARDIOGRAM (TEE);  Surgeon: TSueanne Margarita MD;  [place]  Service: Cardiovascular;  Laterality: N/A;  . TOTAL KNEE ARTHROPLASTY Right 2003  . TRACHEAL ESOPHAGEAL PUNCTURE REPAIR N/A 03/26/2016   Procedure: TRACHEAL ESOPHAGEAL PUNCTURE;  Surgeon: JIzora Gala MD;  [facility];  Service: ENT;  Laterality: N/A;  . TRACHEOESOPHAGEAL FISTULA REPAIR N/A 03/26/2016   Procedure: TRACHEO-ESOPHAGEAL   PUNCTURE,FISTULAR CLOSURE;  Surgeon: JIzora Gala MD;  [facility];  Service: ENT;  Laterality: N/A;  . TRACHEOESOPHAGEAL FISTULA REPAIR N/A 04/09/2016   Procedure: FISTULA REPAIR WITH  MUSCLE ROTATION FLAP;  Surgeon: JIzora Gala MD;  [place]  Service: ENT;  Laterality: N/A;  . TRACHEOESOPHAGEAL FISTULA REPAIR N/A 07/27/2016   Procedure: CLOSURE OF FISTULA;  Surgeon: JIzora Gala MD;  [place]  Service: ENT;  Laterality: N/A;  . TYMPANOPLASTY  1967   "? side"    Allergies  Allergen Reactions  . Ambien [Zolpidem] Other (See Comments)    Causes confusion  . Metoprolol Other (See Comments)    Hypersensitive to beta blockers with associated hypotension and bradycardia  . Citalopram Nausea Only  . Trazodone And Nefazodone Other (See Comments)    Dry mouth  . Zoloft [Sertraline  Hcl] Other (See Comments)    dizzy    Outpatient Encounter Medications as of 09/16/2017  Medication Sig  . atorvastatin (LIPITOR) 40 MG tablet Take 1 tablet (40 mg total) by mouth daily.  . furosemide (LASIX) 40 MG tablet Take 1 tablet (40 mg total) by mouth every morning AND 0.5 tablets (20 mg total) every evening.  .Marland Kitchenlisinopril (PRINIVIL,ZESTRIL) 2.5 MG tablet Take 1 tablet (2.5 mg total) by mouth daily.   No facility-administered encounter medications on file as of 09/16/2017.     Review of Systems:  Review of Systems  Constitutional: Positive for malaise/fatigue and weight  loss. Negative for chills and fever.  HENT: Positive for hearing loss. Negative for congestion.        Has artificial larynx  Eyes: Negative for blurred vision.       Glasses  Respiratory: Negative for shortness of breath.   Cardiovascular: Negative for chest pain, palpitations and leg swelling.  Gastrointestinal: Negative for abdominal pain, blood in stool and melena.  Genitourinary: Negative for dysuria.  Musculoskeletal: Negative for falls and joint pain.  Skin: Negative for itching and rash.  Neurological: Positive for weakness. Negative for dizziness and loss of consciousness.  Psychiatric/Behavioral: Positive for memory loss. Negative for depression.    Health Maintenance  Topic Date Due  . TETANUS/TDAP  08/11/2023  . INFLUENZA VACCINE  Completed  . PNA vac Low Risk Adult  Completed    Physical Exam: Vitals:   09/16/17 1008  BP: 128/64  Pulse: 61  Temp: 98.6 F (37 C)  TempSrc: Oral  SpO2: 95%  Weight: 172 lb 6.4 oz (78.2 kg)  Height: 6' (1.829 m)   Body mass index is 23.38 kg/m. Physical Exam  Constitutional: He is oriented to person, place, and time.  Increasingly frail appearing white male  HENT:  Head: Normocephalic and atraumatic.  Hearing aids  Eyes:  glasses  Neck:  Tracheostomy; has autolarynx device to speak with  Cardiovascular: Intact distal pulses.  irreg irreg    Pulmonary/Chest: Effort normal and breath sounds normal. No respiratory distress.  Abdominal: Bowel sounds are normal.  Musculoskeletal:  Ambulating very unsteadily with cane and using furniture and people for additional support  Neurological: He is alert and oriented to person, place, and time.  But gets stories confused and timelines  Skin: Skin is warm and dry. There is pallor.  Psychiatric: He has a normal mood and affect.    Labs reviewed: Basic Metabolic Panel: Recent Labs    12/06/16 1048  07/08/17 0859  08/11/17 1819  08/15/17 0407 08/16/17 0720 08/27/17 09/09/17 1205  NA 138   < > 141   < >  --    < > 137 142  --  140  K 3.8   < > 4.4   < >  --    < > 3.4* 3.5  --  4.1  CL 109   < > 104   < >  --    < > 99* 106  --  100  CO2 21*   < > 28   < >  --    < > 28 26  --  24  GLUCOSE 88   < > 92   < >  --    < > 97 84  --  98  BUN 22*   < > 22   < >  --    < > 26* 32*  --  24  CREATININE 0.88   < > 1.13*   < >  --    < > 1.38* 1.35*  --  1.06  CALCIUM 7.8*   < > 9.2   < >  --    < > 8.6* 8.2*  --  8.7  MG 2.1  --   --   --   --   --   --   --   --   --   PHOS 2.9  --   --   --   --   --   --   --   --   --   TSH  --    < >  8.07*  --  7.512*  --   --   --  5.26  --    < > = values in this interval not displayed.   Liver Function Tests: Recent Labs    12/08/16 0238 03/23/17 1422 08/10/17 2037  AST _0 ALT 18 15 15*  ALKPHOS 68 94 93  BILITOT 0.8 1.1 1.4*  PROT 5.9* 6.2 6.4*  ALBUMIN 2.4* 3.7 3.4*   No results for input(s): LIPASE, AMYLASE in the last 8760 hours. No results for input(s): AMMONIA in the last 8760 hours. CBC: Recent Labs    03/23/17 1422 07/08/17 0859  08/11/17 0543  08/14/17 0419 08/15/17 0407 08/16/17 0720  WBC 6.9 7.5   < > 7.3   < > 9.8 7.9 6.8  NEUTROABS 4,692 5,325  --  5.6  --   --   --   --   HGB 14.7 14.4   < > 15.6   < > 16.0 15.1 14.3  HCT 44.1 43.2   < > 47.0   < > 49.3 47.1 43.9  MCV 96.9 93.7   < > 97.5   < > 98.8 98.1  99.5  PLT 141 182   < > 128*   < > 148* 127* 109*   < > = values in this interval not displayed.   Lipid Panel: Recent Labs    12/21/16 0949  CHOL 141  HDL 52  LDLCALC 77  TRIG 62  CHOLHDL 2.7   Lab Results  Component Value Date   HGBA1C 5.5 08/11/2017    Procedures since last visit: Dg Chest Port 1 View  Result Date: 08/10/2017 CLINICAL DATA:  Shortness of breath EXAM: PORTABLE CHEST 1 VIEW COMPARISON:  March 24, 2017 FINDINGS: The mediastinal contour is normal. Heart size is enlarged. The aorta is tortuous. There is patchy consolidation of left lung base. Bilateral pleural effusions are noted. There is mild interstitial edema. The visualized skeletal structures are unremarkable. IMPRESSION: Mild congestive heart failure. Patchy consolidation of left lung base, pneumonia is not excluded. Small bilateral pleural effusions. Electronically Signed   By: Abelardo Diesel M.D.   On: 08/10/2017 17:49    Assessment/Plan 1. Systolic CHF, acute on chronic (HCC) -seems to be euvolemic now, no signs of acute chf or volume overload, weight up a few lbs but had lost some due to malnutrition with hospitalization also  2. Hallucination -intermittent, seems they are more like delusions; seems his cognitive impairment is progressing into a dementia -lives in [place] with some caregiver support, but needs more for medication mgt and to make sure he gets teds on in the morning -discussed that he would do better in a higher level of care, but his daughter has no plans to make that move  3. Abnormal TSH -likely due to acute illness, f/u before next visit  4. Protein-calorie malnutrition, severe -encouraged po intake, supplemental shakes  5. Baker cyst, left -gets regular aspiration and injection due to this which gives him short term relief  6. S/P laryngectomy -for head and neck cancer; communicates with special device and does quite well now with this unless he talks too fast or gets congested  7.  Advance care planning    Last ACP Note 06/21/2017 to 09/19/2017    ACP (Advance Care Planning) by Gayland Curry, DO at 09/16/2017 10:00 AM    Date of Service   Author Author Type Status Note Type File Time  09/16/2017 Eldridge Scot, Noora Locascio L, DO  Physician Signed ACP (Advance Care Planning) 09/19/2017        Met with pt and his daughter, Hilda Blades.    Has living will on file in documents.  Discussed code status.  We reviewed that prior to Debra's involvement in his care, Mr. Santagata had said he wanted DNR status; however, since his laryngeal cancer, clearly that was revoked.  We reviewed the numbers behind out of hospital cardiopulmonary arrest and the need to consider baseline morbidity and frailty like Mr. Coba has at 84 yo.  He has very few reserves and is unlikely to survive such a resuscitation let alone have a reasonable quality of life.  Hilda Blades suggested that perhaps we could resuscitate him and then decide if he should be kept on the machine (which he has expressed in his living will that he does not want).  I explained how this can be even more difficulty to decide upon at that stressful time.  They said they will discuss this further and we can complete necessary paperwork next visit.  About 20 mins were spent on ACP today.    Ozzie Knobel L. Chapel Silverthorn, D.O. [facility] [address], [place] [address] Cell Phone (Mon-Fri 8am-5pm):  [phone] On Call:  [phone] & follow prompts after 5pm & weekends Office Phone:  [phone] Office Fax:  [phone]     remaining time spent on TOC/med mgt was 30 mins Next appt:  11/04/2017  Kiarah Eckstein L. Meiya Wisler, D.O. [facility] [address], [place] [address] Cell Phone (Mon-Fri 8am-5pm):  [phone] On Call:  [phone] & follow prompts after 5pm & weekends Office Phone:  [phone] Office Fax:  [phone]

## 2017-09-17 DIAGNOSIS — E43 Unspecified severe protein-calorie malnutrition: Secondary | ICD-10-CM | POA: Diagnosis not present

## 2017-09-17 DIAGNOSIS — I739 Peripheral vascular disease, unspecified: Secondary | ICD-10-CM | POA: Diagnosis not present

## 2017-09-17 DIAGNOSIS — Z9181 History of falling: Secondary | ICD-10-CM | POA: Diagnosis not present

## 2017-09-17 DIAGNOSIS — I5033 Acute on chronic diastolic (congestive) heart failure: Secondary | ICD-10-CM | POA: Diagnosis not present

## 2017-09-17 DIAGNOSIS — I480 Paroxysmal atrial fibrillation: Secondary | ICD-10-CM | POA: Diagnosis not present

## 2017-09-17 DIAGNOSIS — Z85038 Personal history of other malignant neoplasm of large intestine: Secondary | ICD-10-CM | POA: Diagnosis not present

## 2017-09-17 DIAGNOSIS — Z8521 Personal history of malignant neoplasm of larynx: Secondary | ICD-10-CM | POA: Diagnosis not present

## 2017-09-17 DIAGNOSIS — I130 Hypertensive heart and chronic kidney disease with heart failure and stage 1 through stage 4 chronic kidney disease, or unspecified chronic kidney disease: Secondary | ICD-10-CM | POA: Diagnosis not present

## 2017-09-17 DIAGNOSIS — R262 Difficulty in walking, not elsewhere classified: Secondary | ICD-10-CM | POA: Diagnosis not present

## 2017-09-17 DIAGNOSIS — M6281 Muscle weakness (generalized): Secondary | ICD-10-CM | POA: Diagnosis not present

## 2017-09-17 DIAGNOSIS — N183 Chronic kidney disease, stage 3 (moderate): Secondary | ICD-10-CM | POA: Diagnosis not present

## 2017-09-19 NOTE — ACP (Advance Care Planning) (Signed)
Met with pt and his daughter, Timothy Suarez.    Has living will on file in documents.  Discussed code status.  We reviewed that prior to Debra's involvement in his care, Timothy Suarez had said he wanted DNR status; however, since his laryngeal cancer, clearly that was revoked.  We reviewed the numbers behind out of hospital cardiopulmonary arrest and the need to consider baseline morbidity and frailty like Timothy Suarez has at 84 yo.  He has very few reserves and is unlikely to survive such a resuscitation let alone have a reasonable quality of life.  Timothy Suarez suggested that perhaps we could resuscitate him and then decide if he should be kept on the machine (which he has expressed in his living will that he does not want).  I explained how this can be even more difficulty to decide upon at that stressful time.  They said they will discuss this further and we can complete necessary paperwork next visit.  About 20 mins were spent on ACP today.    Timothy Suarez, D.O. [facility] [address], [place] [address] Cell Phone (Mon-Fri 8am-5pm):  [phone] On Call:  [phone] & follow prompts after 5pm & weekends Office Phone:  [phone] Office Fax:  [phone]

## 2017-09-20 DIAGNOSIS — Z8521 Personal history of malignant neoplasm of larynx: Secondary | ICD-10-CM | POA: Diagnosis not present

## 2017-09-20 DIAGNOSIS — N183 Chronic kidney disease, stage 3 (moderate): Secondary | ICD-10-CM | POA: Diagnosis not present

## 2017-09-20 DIAGNOSIS — I739 Peripheral vascular disease, unspecified: Secondary | ICD-10-CM | POA: Diagnosis not present

## 2017-09-20 DIAGNOSIS — R262 Difficulty in walking, not elsewhere classified: Secondary | ICD-10-CM | POA: Diagnosis not present

## 2017-09-20 DIAGNOSIS — I130 Hypertensive heart and chronic kidney disease with heart failure and stage 1 through stage 4 chronic kidney disease, or unspecified chronic kidney disease: Secondary | ICD-10-CM | POA: Diagnosis not present

## 2017-09-20 DIAGNOSIS — Z9181 History of falling: Secondary | ICD-10-CM | POA: Diagnosis not present

## 2017-09-20 DIAGNOSIS — M6281 Muscle weakness (generalized): Secondary | ICD-10-CM | POA: Diagnosis not present

## 2017-09-20 DIAGNOSIS — E43 Unspecified severe protein-calorie malnutrition: Secondary | ICD-10-CM | POA: Diagnosis not present

## 2017-09-20 DIAGNOSIS — I480 Paroxysmal atrial fibrillation: Secondary | ICD-10-CM | POA: Diagnosis not present

## 2017-09-20 DIAGNOSIS — Z85038 Personal history of other malignant neoplasm of large intestine: Secondary | ICD-10-CM | POA: Diagnosis not present

## 2017-09-20 DIAGNOSIS — I5033 Acute on chronic diastolic (congestive) heart failure: Secondary | ICD-10-CM | POA: Diagnosis not present

## 2017-09-20 NOTE — Addendum Note (Signed)
Addended by: Gayland Curry on: 09/20/2017 03:53 PM   Modules accepted: Level of Service

## 2017-09-21 DIAGNOSIS — R262 Difficulty in walking, not elsewhere classified: Secondary | ICD-10-CM | POA: Diagnosis not present

## 2017-09-21 DIAGNOSIS — N183 Chronic kidney disease, stage 3 (moderate): Secondary | ICD-10-CM | POA: Diagnosis not present

## 2017-09-21 DIAGNOSIS — Z8521 Personal history of malignant neoplasm of larynx: Secondary | ICD-10-CM | POA: Diagnosis not present

## 2017-09-21 DIAGNOSIS — Z9181 History of falling: Secondary | ICD-10-CM | POA: Diagnosis not present

## 2017-09-21 DIAGNOSIS — M6281 Muscle weakness (generalized): Secondary | ICD-10-CM | POA: Diagnosis not present

## 2017-09-21 DIAGNOSIS — I5033 Acute on chronic diastolic (congestive) heart failure: Secondary | ICD-10-CM | POA: Diagnosis not present

## 2017-09-21 DIAGNOSIS — I130 Hypertensive heart and chronic kidney disease with heart failure and stage 1 through stage 4 chronic kidney disease, or unspecified chronic kidney disease: Secondary | ICD-10-CM | POA: Diagnosis not present

## 2017-09-21 DIAGNOSIS — I739 Peripheral vascular disease, unspecified: Secondary | ICD-10-CM | POA: Diagnosis not present

## 2017-09-21 DIAGNOSIS — E43 Unspecified severe protein-calorie malnutrition: Secondary | ICD-10-CM | POA: Diagnosis not present

## 2017-09-21 DIAGNOSIS — Z85038 Personal history of other malignant neoplasm of large intestine: Secondary | ICD-10-CM | POA: Diagnosis not present

## 2017-09-21 DIAGNOSIS — I480 Paroxysmal atrial fibrillation: Secondary | ICD-10-CM | POA: Diagnosis not present

## 2017-09-22 DIAGNOSIS — I739 Peripheral vascular disease, unspecified: Secondary | ICD-10-CM | POA: Diagnosis not present

## 2017-09-22 DIAGNOSIS — Z85038 Personal history of other malignant neoplasm of large intestine: Secondary | ICD-10-CM | POA: Diagnosis not present

## 2017-09-22 DIAGNOSIS — E43 Unspecified severe protein-calorie malnutrition: Secondary | ICD-10-CM | POA: Diagnosis not present

## 2017-09-22 DIAGNOSIS — M6281 Muscle weakness (generalized): Secondary | ICD-10-CM | POA: Diagnosis not present

## 2017-09-22 DIAGNOSIS — Z8521 Personal history of malignant neoplasm of larynx: Secondary | ICD-10-CM | POA: Diagnosis not present

## 2017-09-22 DIAGNOSIS — I5033 Acute on chronic diastolic (congestive) heart failure: Secondary | ICD-10-CM | POA: Diagnosis not present

## 2017-09-22 DIAGNOSIS — R262 Difficulty in walking, not elsewhere classified: Secondary | ICD-10-CM | POA: Diagnosis not present

## 2017-09-22 DIAGNOSIS — Z9181 History of falling: Secondary | ICD-10-CM | POA: Diagnosis not present

## 2017-09-22 DIAGNOSIS — N183 Chronic kidney disease, stage 3 (moderate): Secondary | ICD-10-CM | POA: Diagnosis not present

## 2017-09-22 DIAGNOSIS — I130 Hypertensive heart and chronic kidney disease with heart failure and stage 1 through stage 4 chronic kidney disease, or unspecified chronic kidney disease: Secondary | ICD-10-CM | POA: Diagnosis not present

## 2017-09-22 DIAGNOSIS — I480 Paroxysmal atrial fibrillation: Secondary | ICD-10-CM | POA: Diagnosis not present

## 2017-09-23 DIAGNOSIS — N183 Chronic kidney disease, stage 3 (moderate): Secondary | ICD-10-CM | POA: Diagnosis not present

## 2017-09-23 DIAGNOSIS — I480 Paroxysmal atrial fibrillation: Secondary | ICD-10-CM | POA: Diagnosis not present

## 2017-09-23 DIAGNOSIS — M6281 Muscle weakness (generalized): Secondary | ICD-10-CM | POA: Diagnosis not present

## 2017-09-23 DIAGNOSIS — I5033 Acute on chronic diastolic (congestive) heart failure: Secondary | ICD-10-CM | POA: Diagnosis not present

## 2017-09-23 DIAGNOSIS — R262 Difficulty in walking, not elsewhere classified: Secondary | ICD-10-CM | POA: Diagnosis not present

## 2017-09-23 DIAGNOSIS — I739 Peripheral vascular disease, unspecified: Secondary | ICD-10-CM | POA: Diagnosis not present

## 2017-09-23 DIAGNOSIS — E43 Unspecified severe protein-calorie malnutrition: Secondary | ICD-10-CM | POA: Diagnosis not present

## 2017-09-23 DIAGNOSIS — Z85038 Personal history of other malignant neoplasm of large intestine: Secondary | ICD-10-CM | POA: Diagnosis not present

## 2017-09-23 DIAGNOSIS — I130 Hypertensive heart and chronic kidney disease with heart failure and stage 1 through stage 4 chronic kidney disease, or unspecified chronic kidney disease: Secondary | ICD-10-CM | POA: Diagnosis not present

## 2017-09-23 DIAGNOSIS — Z8521 Personal history of malignant neoplasm of larynx: Secondary | ICD-10-CM | POA: Diagnosis not present

## 2017-09-23 DIAGNOSIS — Z9181 History of falling: Secondary | ICD-10-CM | POA: Diagnosis not present

## 2017-09-25 DIAGNOSIS — C320 Malignant neoplasm of glottis: Secondary | ICD-10-CM | POA: Diagnosis not present

## 2017-09-25 DIAGNOSIS — Z9002 Acquired absence of larynx: Secondary | ICD-10-CM | POA: Diagnosis not present

## 2017-09-28 DIAGNOSIS — I739 Peripheral vascular disease, unspecified: Secondary | ICD-10-CM | POA: Diagnosis not present

## 2017-09-28 DIAGNOSIS — I5033 Acute on chronic diastolic (congestive) heart failure: Secondary | ICD-10-CM | POA: Diagnosis not present

## 2017-09-28 DIAGNOSIS — I480 Paroxysmal atrial fibrillation: Secondary | ICD-10-CM | POA: Diagnosis not present

## 2017-09-28 DIAGNOSIS — Z9181 History of falling: Secondary | ICD-10-CM | POA: Diagnosis not present

## 2017-09-28 DIAGNOSIS — Z8521 Personal history of malignant neoplasm of larynx: Secondary | ICD-10-CM | POA: Diagnosis not present

## 2017-09-28 DIAGNOSIS — M6281 Muscle weakness (generalized): Secondary | ICD-10-CM | POA: Diagnosis not present

## 2017-09-28 DIAGNOSIS — E43 Unspecified severe protein-calorie malnutrition: Secondary | ICD-10-CM | POA: Diagnosis not present

## 2017-09-28 DIAGNOSIS — Z85038 Personal history of other malignant neoplasm of large intestine: Secondary | ICD-10-CM | POA: Diagnosis not present

## 2017-09-28 DIAGNOSIS — N183 Chronic kidney disease, stage 3 (moderate): Secondary | ICD-10-CM | POA: Diagnosis not present

## 2017-09-28 DIAGNOSIS — R262 Difficulty in walking, not elsewhere classified: Secondary | ICD-10-CM | POA: Diagnosis not present

## 2017-09-28 DIAGNOSIS — I130 Hypertensive heart and chronic kidney disease with heart failure and stage 1 through stage 4 chronic kidney disease, or unspecified chronic kidney disease: Secondary | ICD-10-CM | POA: Diagnosis not present

## 2017-09-30 DIAGNOSIS — I480 Paroxysmal atrial fibrillation: Secondary | ICD-10-CM | POA: Diagnosis not present

## 2017-09-30 DIAGNOSIS — I739 Peripheral vascular disease, unspecified: Secondary | ICD-10-CM | POA: Diagnosis not present

## 2017-09-30 DIAGNOSIS — Z8521 Personal history of malignant neoplasm of larynx: Secondary | ICD-10-CM | POA: Diagnosis not present

## 2017-09-30 DIAGNOSIS — N183 Chronic kidney disease, stage 3 (moderate): Secondary | ICD-10-CM | POA: Diagnosis not present

## 2017-09-30 DIAGNOSIS — R262 Difficulty in walking, not elsewhere classified: Secondary | ICD-10-CM | POA: Diagnosis not present

## 2017-09-30 DIAGNOSIS — I5033 Acute on chronic diastolic (congestive) heart failure: Secondary | ICD-10-CM | POA: Diagnosis not present

## 2017-09-30 DIAGNOSIS — I130 Hypertensive heart and chronic kidney disease with heart failure and stage 1 through stage 4 chronic kidney disease, or unspecified chronic kidney disease: Secondary | ICD-10-CM | POA: Diagnosis not present

## 2017-09-30 DIAGNOSIS — Z85038 Personal history of other malignant neoplasm of large intestine: Secondary | ICD-10-CM | POA: Diagnosis not present

## 2017-09-30 DIAGNOSIS — M6281 Muscle weakness (generalized): Secondary | ICD-10-CM | POA: Diagnosis not present

## 2017-09-30 DIAGNOSIS — Z9181 History of falling: Secondary | ICD-10-CM | POA: Diagnosis not present

## 2017-09-30 DIAGNOSIS — E43 Unspecified severe protein-calorie malnutrition: Secondary | ICD-10-CM | POA: Diagnosis not present

## 2017-10-04 DIAGNOSIS — I5033 Acute on chronic diastolic (congestive) heart failure: Secondary | ICD-10-CM | POA: Diagnosis not present

## 2017-10-04 DIAGNOSIS — R262 Difficulty in walking, not elsewhere classified: Secondary | ICD-10-CM | POA: Diagnosis not present

## 2017-10-04 DIAGNOSIS — Z85038 Personal history of other malignant neoplasm of large intestine: Secondary | ICD-10-CM | POA: Diagnosis not present

## 2017-10-04 DIAGNOSIS — Z9181 History of falling: Secondary | ICD-10-CM | POA: Diagnosis not present

## 2017-10-04 DIAGNOSIS — I130 Hypertensive heart and chronic kidney disease with heart failure and stage 1 through stage 4 chronic kidney disease, or unspecified chronic kidney disease: Secondary | ICD-10-CM | POA: Diagnosis not present

## 2017-10-04 DIAGNOSIS — E43 Unspecified severe protein-calorie malnutrition: Secondary | ICD-10-CM | POA: Diagnosis not present

## 2017-10-04 DIAGNOSIS — N183 Chronic kidney disease, stage 3 (moderate): Secondary | ICD-10-CM | POA: Diagnosis not present

## 2017-10-04 DIAGNOSIS — Z8521 Personal history of malignant neoplasm of larynx: Secondary | ICD-10-CM | POA: Diagnosis not present

## 2017-10-04 DIAGNOSIS — I480 Paroxysmal atrial fibrillation: Secondary | ICD-10-CM | POA: Diagnosis not present

## 2017-10-04 DIAGNOSIS — I739 Peripheral vascular disease, unspecified: Secondary | ICD-10-CM | POA: Diagnosis not present

## 2017-10-04 DIAGNOSIS — M6281 Muscle weakness (generalized): Secondary | ICD-10-CM | POA: Diagnosis not present

## 2017-10-05 DIAGNOSIS — I739 Peripheral vascular disease, unspecified: Secondary | ICD-10-CM | POA: Diagnosis not present

## 2017-10-05 DIAGNOSIS — E43 Unspecified severe protein-calorie malnutrition: Secondary | ICD-10-CM | POA: Diagnosis not present

## 2017-10-05 DIAGNOSIS — M6281 Muscle weakness (generalized): Secondary | ICD-10-CM | POA: Diagnosis not present

## 2017-10-05 DIAGNOSIS — Z85038 Personal history of other malignant neoplasm of large intestine: Secondary | ICD-10-CM | POA: Diagnosis not present

## 2017-10-05 DIAGNOSIS — N183 Chronic kidney disease, stage 3 (moderate): Secondary | ICD-10-CM | POA: Diagnosis not present

## 2017-10-05 DIAGNOSIS — I130 Hypertensive heart and chronic kidney disease with heart failure and stage 1 through stage 4 chronic kidney disease, or unspecified chronic kidney disease: Secondary | ICD-10-CM | POA: Diagnosis not present

## 2017-10-05 DIAGNOSIS — Z8521 Personal history of malignant neoplasm of larynx: Secondary | ICD-10-CM | POA: Diagnosis not present

## 2017-10-05 DIAGNOSIS — R262 Difficulty in walking, not elsewhere classified: Secondary | ICD-10-CM | POA: Diagnosis not present

## 2017-10-05 DIAGNOSIS — Z9181 History of falling: Secondary | ICD-10-CM | POA: Diagnosis not present

## 2017-10-05 DIAGNOSIS — I480 Paroxysmal atrial fibrillation: Secondary | ICD-10-CM | POA: Diagnosis not present

## 2017-10-05 DIAGNOSIS — I5033 Acute on chronic diastolic (congestive) heart failure: Secondary | ICD-10-CM | POA: Diagnosis not present

## 2017-10-06 ENCOUNTER — Other Ambulatory Visit: Payer: Self-pay | Admitting: *Deleted

## 2017-10-06 DIAGNOSIS — I130 Hypertensive heart and chronic kidney disease with heart failure and stage 1 through stage 4 chronic kidney disease, or unspecified chronic kidney disease: Secondary | ICD-10-CM | POA: Diagnosis not present

## 2017-10-06 DIAGNOSIS — I480 Paroxysmal atrial fibrillation: Secondary | ICD-10-CM | POA: Diagnosis not present

## 2017-10-06 DIAGNOSIS — N183 Chronic kidney disease, stage 3 (moderate): Secondary | ICD-10-CM | POA: Diagnosis not present

## 2017-10-06 DIAGNOSIS — Z9181 History of falling: Secondary | ICD-10-CM | POA: Diagnosis not present

## 2017-10-06 DIAGNOSIS — R262 Difficulty in walking, not elsewhere classified: Secondary | ICD-10-CM | POA: Diagnosis not present

## 2017-10-06 DIAGNOSIS — I5033 Acute on chronic diastolic (congestive) heart failure: Secondary | ICD-10-CM | POA: Diagnosis not present

## 2017-10-06 DIAGNOSIS — Z8521 Personal history of malignant neoplasm of larynx: Secondary | ICD-10-CM | POA: Diagnosis not present

## 2017-10-06 DIAGNOSIS — M6281 Muscle weakness (generalized): Secondary | ICD-10-CM | POA: Diagnosis not present

## 2017-10-06 DIAGNOSIS — I739 Peripheral vascular disease, unspecified: Secondary | ICD-10-CM | POA: Diagnosis not present

## 2017-10-06 DIAGNOSIS — Z85038 Personal history of other malignant neoplasm of large intestine: Secondary | ICD-10-CM | POA: Diagnosis not present

## 2017-10-06 DIAGNOSIS — E43 Unspecified severe protein-calorie malnutrition: Secondary | ICD-10-CM | POA: Diagnosis not present

## 2017-10-06 MED ORDER — LISINOPRIL 2.5 MG PO TABS: 2.5000 mg | ORAL_TABLET | Freq: Every day | ORAL | 1 refills | Status: DC

## 2017-10-06 NOTE — Telephone Encounter (Signed)
Timothy Suarez 

## 2017-10-07 DIAGNOSIS — Z9181 History of falling: Secondary | ICD-10-CM | POA: Diagnosis not present

## 2017-10-07 DIAGNOSIS — I5033 Acute on chronic diastolic (congestive) heart failure: Secondary | ICD-10-CM | POA: Diagnosis not present

## 2017-10-07 DIAGNOSIS — I480 Paroxysmal atrial fibrillation: Secondary | ICD-10-CM | POA: Diagnosis not present

## 2017-10-07 DIAGNOSIS — Z85038 Personal history of other malignant neoplasm of large intestine: Secondary | ICD-10-CM | POA: Diagnosis not present

## 2017-10-07 DIAGNOSIS — M6281 Muscle weakness (generalized): Secondary | ICD-10-CM | POA: Diagnosis not present

## 2017-10-07 DIAGNOSIS — I130 Hypertensive heart and chronic kidney disease with heart failure and stage 1 through stage 4 chronic kidney disease, or unspecified chronic kidney disease: Secondary | ICD-10-CM | POA: Diagnosis not present

## 2017-10-07 DIAGNOSIS — Z8521 Personal history of malignant neoplasm of larynx: Secondary | ICD-10-CM | POA: Diagnosis not present

## 2017-10-07 DIAGNOSIS — I739 Peripheral vascular disease, unspecified: Secondary | ICD-10-CM | POA: Diagnosis not present

## 2017-10-07 DIAGNOSIS — R262 Difficulty in walking, not elsewhere classified: Secondary | ICD-10-CM | POA: Diagnosis not present

## 2017-10-07 DIAGNOSIS — N183 Chronic kidney disease, stage 3 (moderate): Secondary | ICD-10-CM | POA: Diagnosis not present

## 2017-10-07 DIAGNOSIS — E43 Unspecified severe protein-calorie malnutrition: Secondary | ICD-10-CM | POA: Diagnosis not present

## 2017-10-11 DIAGNOSIS — I130 Hypertensive heart and chronic kidney disease with heart failure and stage 1 through stage 4 chronic kidney disease, or unspecified chronic kidney disease: Secondary | ICD-10-CM | POA: Diagnosis not present

## 2017-10-11 DIAGNOSIS — I5033 Acute on chronic diastolic (congestive) heart failure: Secondary | ICD-10-CM | POA: Diagnosis not present

## 2017-10-11 DIAGNOSIS — Z85038 Personal history of other malignant neoplasm of large intestine: Secondary | ICD-10-CM | POA: Diagnosis not present

## 2017-10-11 DIAGNOSIS — N183 Chronic kidney disease, stage 3 (moderate): Secondary | ICD-10-CM | POA: Diagnosis not present

## 2017-10-11 DIAGNOSIS — R262 Difficulty in walking, not elsewhere classified: Secondary | ICD-10-CM | POA: Diagnosis not present

## 2017-10-11 DIAGNOSIS — E43 Unspecified severe protein-calorie malnutrition: Secondary | ICD-10-CM | POA: Diagnosis not present

## 2017-10-11 DIAGNOSIS — I739 Peripheral vascular disease, unspecified: Secondary | ICD-10-CM | POA: Diagnosis not present

## 2017-10-11 DIAGNOSIS — M6281 Muscle weakness (generalized): Secondary | ICD-10-CM | POA: Diagnosis not present

## 2017-10-11 DIAGNOSIS — Z9181 History of falling: Secondary | ICD-10-CM | POA: Diagnosis not present

## 2017-10-11 DIAGNOSIS — Z8521 Personal history of malignant neoplasm of larynx: Secondary | ICD-10-CM | POA: Diagnosis not present

## 2017-10-11 DIAGNOSIS — I480 Paroxysmal atrial fibrillation: Secondary | ICD-10-CM | POA: Diagnosis not present

## 2017-10-12 DIAGNOSIS — M6281 Muscle weakness (generalized): Secondary | ICD-10-CM | POA: Diagnosis not present

## 2017-10-12 DIAGNOSIS — I130 Hypertensive heart and chronic kidney disease with heart failure and stage 1 through stage 4 chronic kidney disease, or unspecified chronic kidney disease: Secondary | ICD-10-CM | POA: Diagnosis not present

## 2017-10-12 DIAGNOSIS — I480 Paroxysmal atrial fibrillation: Secondary | ICD-10-CM | POA: Diagnosis not present

## 2017-10-12 DIAGNOSIS — E43 Unspecified severe protein-calorie malnutrition: Secondary | ICD-10-CM | POA: Diagnosis not present

## 2017-10-12 DIAGNOSIS — Z8521 Personal history of malignant neoplasm of larynx: Secondary | ICD-10-CM | POA: Diagnosis not present

## 2017-10-12 DIAGNOSIS — Z85038 Personal history of other malignant neoplasm of large intestine: Secondary | ICD-10-CM | POA: Diagnosis not present

## 2017-10-12 DIAGNOSIS — R262 Difficulty in walking, not elsewhere classified: Secondary | ICD-10-CM | POA: Diagnosis not present

## 2017-10-12 DIAGNOSIS — I739 Peripheral vascular disease, unspecified: Secondary | ICD-10-CM | POA: Diagnosis not present

## 2017-10-12 DIAGNOSIS — Z9181 History of falling: Secondary | ICD-10-CM | POA: Diagnosis not present

## 2017-10-12 DIAGNOSIS — N183 Chronic kidney disease, stage 3 (moderate): Secondary | ICD-10-CM | POA: Diagnosis not present

## 2017-10-12 DIAGNOSIS — I5033 Acute on chronic diastolic (congestive) heart failure: Secondary | ICD-10-CM | POA: Diagnosis not present

## 2017-10-14 ENCOUNTER — Telehealth: Payer: Self-pay

## 2017-10-14 DIAGNOSIS — I480 Paroxysmal atrial fibrillation: Secondary | ICD-10-CM | POA: Diagnosis not present

## 2017-10-14 DIAGNOSIS — I130 Hypertensive heart and chronic kidney disease with heart failure and stage 1 through stage 4 chronic kidney disease, or unspecified chronic kidney disease: Secondary | ICD-10-CM | POA: Diagnosis not present

## 2017-10-14 DIAGNOSIS — Z9181 History of falling: Secondary | ICD-10-CM | POA: Diagnosis not present

## 2017-10-14 DIAGNOSIS — M6281 Muscle weakness (generalized): Secondary | ICD-10-CM | POA: Diagnosis not present

## 2017-10-14 DIAGNOSIS — Z8521 Personal history of malignant neoplasm of larynx: Secondary | ICD-10-CM | POA: Diagnosis not present

## 2017-10-14 DIAGNOSIS — N183 Chronic kidney disease, stage 3 (moderate): Secondary | ICD-10-CM | POA: Diagnosis not present

## 2017-10-14 DIAGNOSIS — R262 Difficulty in walking, not elsewhere classified: Secondary | ICD-10-CM | POA: Diagnosis not present

## 2017-10-14 DIAGNOSIS — Z85038 Personal history of other malignant neoplasm of large intestine: Secondary | ICD-10-CM | POA: Diagnosis not present

## 2017-10-14 DIAGNOSIS — I5033 Acute on chronic diastolic (congestive) heart failure: Secondary | ICD-10-CM | POA: Diagnosis not present

## 2017-10-14 DIAGNOSIS — I739 Peripheral vascular disease, unspecified: Secondary | ICD-10-CM | POA: Diagnosis not present

## 2017-10-14 DIAGNOSIS — E43 Unspecified severe protein-calorie malnutrition: Secondary | ICD-10-CM | POA: Diagnosis not present

## 2017-10-14 NOTE — Telephone Encounter (Signed)
Dr.Reed received paperwork requesting signature with parameter changes for weight.  Per Dr.Reed please call Nanine Means and clarify if they are asking for Dr.Reed to give new parameters of if they are setting parameters and asking for her signature as in agreement with what they set  I called [facility] and spoke with Eustaquio Maize, per [facility] they have set parameter as listed on form and is asking Dr.Reed to sign as in agreement.  Patient's current weight is 179 and is stable since 09/21/17, fluctuates between  178-179  Form was signed by Dr.Reed and forwarded to Medical Records to fax

## 2017-10-15 DIAGNOSIS — I5033 Acute on chronic diastolic (congestive) heart failure: Secondary | ICD-10-CM | POA: Diagnosis not present

## 2017-10-15 DIAGNOSIS — I130 Hypertensive heart and chronic kidney disease with heart failure and stage 1 through stage 4 chronic kidney disease, or unspecified chronic kidney disease: Secondary | ICD-10-CM | POA: Diagnosis not present

## 2017-10-15 DIAGNOSIS — I739 Peripheral vascular disease, unspecified: Secondary | ICD-10-CM | POA: Diagnosis not present

## 2017-10-15 DIAGNOSIS — N183 Chronic kidney disease, stage 3 (moderate): Secondary | ICD-10-CM | POA: Diagnosis not present

## 2017-10-15 DIAGNOSIS — Z9181 History of falling: Secondary | ICD-10-CM | POA: Diagnosis not present

## 2017-10-15 DIAGNOSIS — E43 Unspecified severe protein-calorie malnutrition: Secondary | ICD-10-CM | POA: Diagnosis not present

## 2017-10-15 DIAGNOSIS — I480 Paroxysmal atrial fibrillation: Secondary | ICD-10-CM | POA: Diagnosis not present

## 2017-10-15 DIAGNOSIS — Z85038 Personal history of other malignant neoplasm of large intestine: Secondary | ICD-10-CM | POA: Diagnosis not present

## 2017-10-15 DIAGNOSIS — M6281 Muscle weakness (generalized): Secondary | ICD-10-CM | POA: Diagnosis not present

## 2017-10-15 DIAGNOSIS — R262 Difficulty in walking, not elsewhere classified: Secondary | ICD-10-CM | POA: Diagnosis not present

## 2017-10-15 DIAGNOSIS — Z8521 Personal history of malignant neoplasm of larynx: Secondary | ICD-10-CM | POA: Diagnosis not present

## 2017-10-19 DIAGNOSIS — Z85038 Personal history of other malignant neoplasm of large intestine: Secondary | ICD-10-CM | POA: Diagnosis not present

## 2017-10-19 DIAGNOSIS — I130 Hypertensive heart and chronic kidney disease with heart failure and stage 1 through stage 4 chronic kidney disease, or unspecified chronic kidney disease: Secondary | ICD-10-CM | POA: Diagnosis not present

## 2017-10-19 DIAGNOSIS — I739 Peripheral vascular disease, unspecified: Secondary | ICD-10-CM | POA: Diagnosis not present

## 2017-10-19 DIAGNOSIS — Z9181 History of falling: Secondary | ICD-10-CM | POA: Diagnosis not present

## 2017-10-19 DIAGNOSIS — I5033 Acute on chronic diastolic (congestive) heart failure: Secondary | ICD-10-CM | POA: Diagnosis not present

## 2017-10-19 DIAGNOSIS — E43 Unspecified severe protein-calorie malnutrition: Secondary | ICD-10-CM | POA: Diagnosis not present

## 2017-10-19 DIAGNOSIS — N183 Chronic kidney disease, stage 3 (moderate): Secondary | ICD-10-CM | POA: Diagnosis not present

## 2017-10-19 DIAGNOSIS — R262 Difficulty in walking, not elsewhere classified: Secondary | ICD-10-CM | POA: Diagnosis not present

## 2017-10-19 DIAGNOSIS — Z8521 Personal history of malignant neoplasm of larynx: Secondary | ICD-10-CM | POA: Diagnosis not present

## 2017-10-19 DIAGNOSIS — M6281 Muscle weakness (generalized): Secondary | ICD-10-CM | POA: Diagnosis not present

## 2017-10-19 DIAGNOSIS — I480 Paroxysmal atrial fibrillation: Secondary | ICD-10-CM | POA: Diagnosis not present

## 2017-10-20 ENCOUNTER — Other Ambulatory Visit: Payer: Self-pay | Admitting: *Deleted

## 2017-10-20 DIAGNOSIS — Z8521 Personal history of malignant neoplasm of larynx: Secondary | ICD-10-CM | POA: Diagnosis not present

## 2017-10-20 DIAGNOSIS — I130 Hypertensive heart and chronic kidney disease with heart failure and stage 1 through stage 4 chronic kidney disease, or unspecified chronic kidney disease: Secondary | ICD-10-CM | POA: Diagnosis not present

## 2017-10-20 DIAGNOSIS — I739 Peripheral vascular disease, unspecified: Secondary | ICD-10-CM | POA: Diagnosis not present

## 2017-10-20 DIAGNOSIS — I480 Paroxysmal atrial fibrillation: Secondary | ICD-10-CM | POA: Diagnosis not present

## 2017-10-20 DIAGNOSIS — Z9181 History of falling: Secondary | ICD-10-CM | POA: Diagnosis not present

## 2017-10-20 DIAGNOSIS — Z85038 Personal history of other malignant neoplasm of large intestine: Secondary | ICD-10-CM | POA: Diagnosis not present

## 2017-10-20 DIAGNOSIS — E43 Unspecified severe protein-calorie malnutrition: Secondary | ICD-10-CM | POA: Diagnosis not present

## 2017-10-20 DIAGNOSIS — N183 Chronic kidney disease, stage 3 (moderate): Secondary | ICD-10-CM | POA: Diagnosis not present

## 2017-10-20 DIAGNOSIS — R262 Difficulty in walking, not elsewhere classified: Secondary | ICD-10-CM | POA: Diagnosis not present

## 2017-10-20 DIAGNOSIS — M6281 Muscle weakness (generalized): Secondary | ICD-10-CM | POA: Diagnosis not present

## 2017-10-20 DIAGNOSIS — I5033 Acute on chronic diastolic (congestive) heart failure: Secondary | ICD-10-CM | POA: Diagnosis not present

## 2017-10-20 MED ORDER — FUROSEMIDE 40 MG PO TABS: ORAL_TABLET | ORAL | 3 refills | Status: DC

## 2017-10-20 NOTE — Telephone Encounter (Signed)
[employer] [facility] 

## 2017-10-21 DIAGNOSIS — E43 Unspecified severe protein-calorie malnutrition: Secondary | ICD-10-CM | POA: Diagnosis not present

## 2017-10-21 DIAGNOSIS — I480 Paroxysmal atrial fibrillation: Secondary | ICD-10-CM | POA: Diagnosis not present

## 2017-10-21 DIAGNOSIS — I739 Peripheral vascular disease, unspecified: Secondary | ICD-10-CM | POA: Diagnosis not present

## 2017-10-21 DIAGNOSIS — I5033 Acute on chronic diastolic (congestive) heart failure: Secondary | ICD-10-CM | POA: Diagnosis not present

## 2017-10-21 DIAGNOSIS — N183 Chronic kidney disease, stage 3 (moderate): Secondary | ICD-10-CM | POA: Diagnosis not present

## 2017-10-21 DIAGNOSIS — R262 Difficulty in walking, not elsewhere classified: Secondary | ICD-10-CM | POA: Diagnosis not present

## 2017-10-21 DIAGNOSIS — Z9181 History of falling: Secondary | ICD-10-CM | POA: Diagnosis not present

## 2017-10-21 DIAGNOSIS — I130 Hypertensive heart and chronic kidney disease with heart failure and stage 1 through stage 4 chronic kidney disease, or unspecified chronic kidney disease: Secondary | ICD-10-CM | POA: Diagnosis not present

## 2017-10-21 DIAGNOSIS — Z8521 Personal history of malignant neoplasm of larynx: Secondary | ICD-10-CM | POA: Diagnosis not present

## 2017-10-21 DIAGNOSIS — M6281 Muscle weakness (generalized): Secondary | ICD-10-CM | POA: Diagnosis not present

## 2017-10-21 DIAGNOSIS — Z85038 Personal history of other malignant neoplasm of large intestine: Secondary | ICD-10-CM | POA: Diagnosis not present

## 2017-11-04 ENCOUNTER — Telehealth: Payer: Self-pay | Admitting: *Deleted

## 2017-11-04 ENCOUNTER — Ambulatory Visit (INDEPENDENT_AMBULATORY_CARE_PROVIDER_SITE_OTHER): Payer: Medicare Other | Admitting: Internal Medicine

## 2017-11-04 ENCOUNTER — Encounter: Payer: Self-pay | Admitting: Internal Medicine

## 2017-11-04 VITALS — BP 130/60 | HR 76 | Temp 98.4°F | Ht 72.0 in | Wt 180.0 lb

## 2017-11-04 DIAGNOSIS — I480 Paroxysmal atrial fibrillation: Secondary | ICD-10-CM

## 2017-11-04 DIAGNOSIS — I5023 Acute on chronic systolic (congestive) heart failure: Secondary | ICD-10-CM

## 2017-11-04 DIAGNOSIS — R5383 Other fatigue: Secondary | ICD-10-CM | POA: Diagnosis not present

## 2017-11-04 MED ORDER — APIXABAN 5 MG PO TABS: 5.0000 mg | ORAL_TABLET | Freq: Two times a day (BID) | ORAL | 0 refills | Status: DC

## 2017-11-04 NOTE — Patient Instructions (Addendum)
You are in afib.  This causes your heart to possibly form clots in the top part.  These can travel to the brain causing strokes.  We have started you today on eliquis 5mg  twice a day as a blood thinner.  Take this faithfully.  We will also ask a home health nurse to come back out to check on you and your weights.  I'd also like you to follow up asap with Dr. Radford Pax.  I will send her my note.    I may be calling back if your kidney function has declined and you need to take a smaller dose of eliquis.    Atrial Fibrillation Atrial fibrillation is a type of heartbeat that is irregular or fast (rapid). If you have this condition, your heart keeps quivering in a weird (chaotic) way. This condition can make it so your heart cannot pump blood normally. Having this condition gives a person more risk for stroke, heart failure, and other heart problems. There are different types of atrial fibrillation. Talk with your doctor to learn about the type that you have. Follow these instructions at home:  Take over-the-counter and prescription medicines only as told by your doctor.  If your doctor prescribed a blood-thinning medicine, take it exactly as told. Taking too much of it can cause bleeding. If you do not take enough of it, you will not have the protection that you need against stroke and other problems.  Do not use any tobacco products. These include cigarettes, chewing tobacco, and e-cigarettes. If you need help quitting, ask your doctor.  If you have apnea (obstructive sleep apnea), manage it as told by your doctor.  Do not drink alcohol.  Do not drink beverages that have caffeine. These include coffee, soda, and tea.  Maintain a healthy weight. Do not use diet pills unless your doctor says they are safe for you. Diet pills may make heart problems worse.  Follow diet instructions as told by your doctor.  Exercise regularly as told by your doctor.  Keep all follow-up visits as told by your doctor.  This is important. Contact a doctor if:  You notice a change in the speed, rhythm, or strength of your heartbeat.  You are taking a blood-thinning medicine and you notice more bruising.  You get tired more easily when you move or exercise. Get help right away if:  You have pain in your chest or your belly (abdomen).  You have sweating or weakness.  You feel sick to your stomach (nauseous).  You notice blood in your throw up (vomit), poop (stool), or pee (urine).  You are short of breath.  You suddenly have swollen feet and ankles.  You feel dizzy.  Your suddenly get weak or numb in your face, arms, or legs, especially if it happens on one side of your body.  You have trouble talking, trouble understanding, or both.  Your face or your eyelid droops on one side. These symptoms may be an emergency. Do not wait to see if the symptoms will go away. Get medical help right away. Call your local emergency services (911 in the [place] Do not drive yourself to the hospital. This information is not intended to replace advice given to you by your health care provider. Make sure you discuss any questions you have with your health care provider. Document Released: 05/05/2008 Document Revised: 01/02/2016 Document Reviewed: 11/21/2014 Elsevier Interactive Patient Education  [employer].

## 2017-11-04 NOTE — Progress Notes (Signed)
Location:  [facility] clinic Provider:  Tiffany L. Mariea Clonts, D.O., C.M.D.  Code Status: Full Code  Goals of Care:  Advanced Directives 09/16/2017  Does Patient Have a Medical Advance Directive? Yes  Type of Advance Directive Living will  Does patient want to make changes to medical advance directive? No - Patient declined  Copy of [place] in Chart? -  Pre-existing out of facility DNR order (yellow form or pink MOST form) -     Chief Complaint  Patient presents with  . Medical Management of Chronic Issues    16mth follow-up    HPI: Patient is a 84 y.o. male seen today for medical management of chronic diseases.  Accompanied today with daughter. He is doing well and reports only a few concerns today: low energy levels and Left knee pain.   He reports his energy levels have dropped over the last two weeks since he PT. He had went from the hospitalization to rehab and home with home health. He has been having some form PT during that entire time. He has not been keeping up with the therapies they suggested. He is living at [facility] in independent living.   Left knee-bakers cyst is present, was drained in the hospital. It has been drained several times over a few years.   He reports no trouble with any medicaitons that he is taking.     Past Medical History:  Diagnosis Date  . A-fib ([place])   . Aortic valve insufficiency, acquired   . Arthritis   . Cerebral ischemia   . Colon cancer [facility])     s/p partial colectomy  . Complication of anesthesia    "woke up w/confusion and hallucinations once after mitral valve OR"  . Coronary artery disease    50-70% LAD mitral valve repair 4+ yrs ago  . Glaucoma   . Hernia   . HTN (hypertension)    takes Amlodipine daily  . Hyperlipidemia   . Major depression   . Mild cognitive impairment   . MVP (mitral valve prolapse)    S/P Rt mini thoractomy for Mitral Valve repair  . Paroxysmal atrial fibrillation (HCC)    S/P Maze  procedure  . 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 Prostate cancer ([place])   . 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 Sleep disturbance   . Thoracic aortic aneurysm [facility])     Past Surgical History:  Procedure Laterality Date  . CATARACT EXTRACTION W/ INTRAOCULAR LENS  IMPLANT, BILATERAL Bilateral   . COLECTOMY  2004   Dr [name]  . DIRECT LARYNGOSCOPY N/A 01/15/2016   Procedure: DIRECT LARYNGOSCOPY WITH BIOPSY AND FROZEN SECTION;  Surgeon: Izora Gala, MD;  Location: [place];  Service: ENT;  Laterality: N/A;  . FlexHD patch repair of chest wall hernia.  01/28/2011   [name]  . GASTROSTOMY W/ FEEDING TUBE    . IR GENERIC HISTORICAL  10/08/2016   IR GASTROSTOMY TUBE REMOVAL 10/08/2016 Sandi Mariscal, MD [facility]-INTERV RAD  . JOINT REPLACEMENT    . LARYNGETOMY N/A 01/15/2016   Procedure:  TOTAL LARYNGECTOMY;  Surgeon: Izora Gala, MD;  Location: [facility] OR;  Service: ENT;  Laterality: N/A;  . MAZE  12/27/2008   left side lesion set  . MICROLARYNGOSCOPY Right 01/29/2015   Procedure: MICROLARYNGOSCOPY WITH BIOSPY OF RIGHT VOCAL CORD;  Surgeon: Izora Gala, MD;  Location: [place];  Service: ENT;  Laterality: Right;  .  MITRAL VALVE REPAIR  12/27/2008   complex valvuloplasty with 68mm Memo 3D annuloplasty ring via right minithoracotomy  . PECTORALIS FLAP Left 05/04/2016   Procedure: PECTORALIS FLAP to neck with possible, split thickness skin graft;  Surgeon: Loel Lofty Dillingham, DO;  Location: [place];  Service: Plastics;  Laterality: Left;  . PROSTATE BIOPSY    . SKIN SPLIT GRAFT Left 05/04/2016   Procedure: PECTORALIS MAJOR MYOCUTANEOUS FLAP RECONSTRUCTION OF PHARYNX AND SPLIT THICKNESS SKIN GRAFT;  Surgeon: Izora Gala, MD;  Location: [place];  Service: ENT;  Laterality: Left;  . TEE WITHOUT CARDIOVERSION N/A 01/03/2013   Procedure: TRANSESOPHAGEAL ECHOCARDIOGRAM (TEE);  Surgeon: Sueanne Margarita, MD;  Location: [place];  Service: Cardiovascular;   Laterality: N/A;  . TOTAL KNEE ARTHROPLASTY Right 2003  . TRACHEAL ESOPHAGEAL PUNCTURE REPAIR N/A 03/26/2016   Procedure: TRACHEAL ESOPHAGEAL PUNCTURE;  Surgeon: Izora Gala, MD;  Location: [facility];  Service: ENT;  Laterality: N/A;  . TRACHEOESOPHAGEAL FISTULA REPAIR N/A 03/26/2016   Procedure: TRACHEO-ESOPHAGEAL   PUNCTURE,FISTULAR CLOSURE;  Surgeon: Izora Gala, MD;  Location: [facility] OR;  Service: ENT;  Laterality: N/A;  . TRACHEOESOPHAGEAL FISTULA REPAIR N/A 04/09/2016   Procedure: FISTULA REPAIR WITH  MUSCLE ROTATION FLAP;  Surgeon: Izora Gala, MD;  Location: [place];  Service: ENT;  Laterality: N/A;  . TRACHEOESOPHAGEAL FISTULA REPAIR N/A 07/27/2016   Procedure: CLOSURE OF FISTULA;  Surgeon: Izora Gala, MD;  Location: [place];  Service: ENT;  Laterality: N/A;  . TYMPANOPLASTY  1967   "? side"    Allergies  Allergen Reactions  . Ambien [Zolpidem] Other (See Comments)    Causes confusion  . Metoprolol Other (See Comments)    Hypersensitive to beta blockers with associated hypotension and bradycardia  . Citalopram Nausea Only  . Trazodone And Nefazodone Other (See Comments)    Dry mouth  . Zoloft [Sertraline Hcl] Other (See Comments)    dizzy    Outpatient Encounter Medications as of 11/04/2017  Medication Sig  . atorvastatin (LIPITOR) 40 MG tablet Take 1 tablet (40 mg total) by mouth daily.  . furosemide (LASIX) 40 MG tablet Take 1 tablet (40 mg total) by mouth every morning AND 0.5 tablets (20 mg total) every evening.  Marland Kitchen lisinopril (PRINIVIL,ZESTRIL) 2.5 MG tablet Take 1 tablet (2.5 mg total) by mouth daily.   No facility-administered encounter medications on file as of 11/04/2017.     Review of Systems:  Review of Systems  Constitutional: Negative for chills, fever and malaise/fatigue.  HENT: Positive for hearing loss.        Hearing aids  Has artificial larynx   Eyes: Positive for blurred vision.       Glasses  Respiratory: Positive for shortness of breath. Negative for cough.     Cardiovascular: Positive for leg swelling. Negative for chest pain and palpitations.  Gastrointestinal: Negative for blood in stool, constipation and diarrhea.  Genitourinary: Positive for frequency and urgency. Negative for hematuria.       Medication related   Neurological: Positive for dizziness. Negative for headaches.  Psychiatric/Behavioral: Positive for memory loss. Negative for depression. The patient has insomnia. The patient is not nervous/anxious.     Health Maintenance  Topic Date Due  . TETANUS/TDAP  08/11/2023  . INFLUENZA VACCINE  Completed  . PNA vac Low Risk Adult  Completed    Physical Exam: Vitals:   11/04/17 1053  BP: 130/60  Pulse: 76  Temp: 98.4 F (36.9 C)  TempSrc: Oral  SpO2: 96%  Weight:  180 lb (81.6 kg)  Height: 6' (1.829 m)   Body mass index is 24.41 kg/m. Physical Exam  Constitutional: He is oriented to person, place, and time. He appears well-developed and well-nourished.  Neck:  Tracheostomy; has autolarynx device to speak with   Cardiovascular: Normal rate, regular rhythm, normal heart sounds and intact distal pulses.  Pulmonary/Chest: Effort normal. He has rales.  Mild crackles at base of lungs   Abdominal: Soft. Bowel sounds are normal.  Musculoskeletal: Normal range of motion. He exhibits edema.  Mild 1+ pitting edema -compression hose present  Neurological: He is alert and oriented to person, place, and time.  Skin: Skin is warm and dry. Capillary refill takes less than 2 seconds.  Psychiatric: He has a normal mood and affect. His behavior is normal. Judgment and thought content normal.    Labs reviewed: Basic Metabolic Panel: Recent Labs    12/06/16 1048  07/08/17 0859  08/11/17 1819  08/15/17 0407 08/16/17 0720 08/27/17 09/09/17 1205  NA 138   < > 141   < >  --    < > 137 142  --  140  K 3.8   < > 4.4   < >  --    < > 3.4* 3.5  --  4.1  CL 109   < > 104   < >  --    < > 99* 106  --  100  CO2 21*   < > 28   < >  --    < >  28 26  --  24  GLUCOSE 88   < > 92   < >  --    < > 97 84  --  98  BUN 22*   < > 22   < >  --    < > 26* 32*  --  24  CREATININE 0.88   < > 1.13*   < >  --    < > 1.38* 1.35*  --  1.06  CALCIUM 7.8*   < > 9.2   < >  --    < > 8.6* 8.2*  --  8.7  MG 2.1  --   --   --   --   --   --   --   --   --   PHOS 2.9  --   --   --   --   --   --   --   --   --   TSH  --    < > 8.07*  --  7.512*  --   --   --  5.26  --    < > = values in this interval not displayed.   Liver Function Tests: Recent Labs    12/08/16 0238 03/23/17 1422 08/10/17 2037  AST 17 18 23   ALT 18 15 15*  ALKPHOS 68 94 93  BILITOT 0.8 1.1 1.4*  PROT 5.9* 6.2 6.4*  ALBUMIN 2.4* 3.7 3.4*   No results for input(s): LIPASE, AMYLASE in the last 8760 hours. No results for input(s): AMMONIA in the last 8760 hours. CBC: Recent Labs    03/23/17 1422 07/08/17 0859  08/11/17 0543  08/14/17 0419 08/15/17 0407 08/16/17 0720  WBC 6.9 7.5   < > 7.3   < > 9.8 7.9 6.8  NEUTROABS 4,692 5,325  --  5.6  --   --   --   --   HGB 14.7 14.4   < >  15.6   < > 16.0 15.1 14.3  HCT 44.1 43.2   < > 47.0   < > 49.3 47.1 43.9  MCV 96.9 93.7   < > 97.5   < > 98.8 98.1 99.5  PLT 141 182   < > 128*   < > 148* 127* 109*   < > = values in this interval not displayed.   Lipid Panel: Recent Labs    12/21/16 0949  CHOL 141  HDL 52  LDLCALC 77  TRIG 62  CHOLHDL 2.7   Lab Results  Component Value Date   HGBA1C 5.5 08/11/2017    Procedures since last visit: No results found.  Assessment/Plan 1. Paroxysmal atrial fibrillation (HCC) Found to be back in a.fib today on exam. Per Dr. Radford Pax (Cardiologist) he had not had reoccurrence of this s/p MAZE and MV repair. Per EKG he was in a fib, with RBBB (known per previous EKG) at a rate of 91 bpm. He is without signs of a stroke today at exam. He does report increase in fatigue and noted to have some crackles at base of lungs and mild pitting edema of the ankles and lower leg. He does not notice  the change of rate or rhythm. Will start him on Eliquis and send him to be seen again with Dr. Radford Pax as well as following up with [place] in 2 weeks. As per Dr Landis Gandy note he was intolerant of BB due to previous heart back and SB. We appreciate the collaboration wiht his care.   - EKG 12-Lead - apixaban (ELIQUIS) 5 MG TABS tablet; Take 1 tablet (5 mg total) by mouth 2 (two) times daily.  Dispense: 14 tablet; Refill: 0  2. Systolic CHF, acute on chronic (HCC) He does have some mild volume overloaded on exam today.  His bilateral LE edema with compression present.  His weight is stable from visit with Dr Radford Pax 180lbs. Dr Radford Pax had mentioned changing medications should the volume continue to. Will assess labs and adjust or allow Dr Radford Pax to make adjusts or recommendations.  Last EF was 20-25% by recent ECHO.  - CBC with Differential/Platelet - COMPLETE METABOLIC PANEL WITH GFR - Brain Natriuretic Peptide  3. Fatigue, unspecified type This is new and on going for the last several months. Due to a fib finding during exam and mild volume overload assessed with EKG which correlated the exam finding. See number one. This could be contributing to his new increase in fatigue, or it could be deconditioning from PT and rehab stopping. Will await Dr Landis Gandy changes to medication regimen for a fib and HR control of this. If we can maintain control of this and order PT for him we will see if combination of these will help improve his energy levels.    - CBC with Differential/Platelet - COMPLETE METABOLIC PANEL WITH GFR - Brain Natriuretic Peptide    Labs/tests ordered:   Orders Placed This Encounter  Procedures  . CBC with Differential/Platelet  . COMPLETE METABOLIC PANEL WITH GFR  . Brain Natriuretic Peptide  . EKG 12-Lead     Next appt: 11/15/2017   Karen Kays, RN, DNP Student  [facility] [phone], [place] [address] Cell  Phone (Mon-Fri 8am-5pm):  [phone] On Call:  [phone] & follow prompts after 5pm & weekends Office Phone:  [phone] Office Fax:  [phone]

## 2017-11-04 NOTE — Telephone Encounter (Signed)
Patient daughter, Hilda Blades called and stated that you wanted her to call back with the name of the [place] that seen patient in the past and it was [facility].

## 2017-11-05 LAB — COMPLETE METABOLIC PANEL WITH GFR
AG Ratio: 1.5 (calc) (ref 1.0–2.5)
ALT: 6 U/L — ABNORMAL LOW (ref 9–46)
AST: 13 U/L (ref 10–35)
Albumin: 4 g/dL (ref 3.6–5.1)
Alkaline phosphatase (APISO): 96 U/L (ref 40–115)
BUN/Creatinine Ratio: 17 (calc) (ref 6–22)
BUN: 20 mg/dL (ref 7–25)
CO2: 27 mmol/L (ref 20–32)
Calcium: 9.1 mg/dL (ref 8.6–10.3)
Chloride: 106 mmol/L (ref 98–110)
Creat: 1.15 mg/dL — ABNORMAL HIGH (ref 0.70–1.11)
GFR, Est African American: 65 mL/min/{1.73_m2} (ref >=60)
GFR, Est Non African American: 56 mL/min/{1.73_m2} — ABNORMAL LOW (ref >=60)
Globulin: 2.7 g/dL (calc) (ref 1.9–3.7)
Glucose, Bld: 89 mg/dL (ref 65–99)
Potassium: 4.8 mmol/L (ref 3.5–5.3)
Sodium: 142 mmol/L (ref 135–146)
Total Bilirubin: 1 mg/dL (ref 0.2–1.2)
Total Protein: 6.7 g/dL (ref 6.1–8.1)

## 2017-11-05 LAB — CBC WITH DIFFERENTIAL/PLATELET
Basophils Absolute: 47 cells/uL (ref 0–200)
Basophils Relative: 0.6 %
Eosinophils Absolute: 78 cells/uL (ref 15–500)
Eosinophils Relative: 1 %
HCT: 43 % (ref 38.5–50.0)
Hemoglobin: 14.8 g/dL (ref 13.2–17.1)
Lymphs Abs: 1396 cells/uL (ref 850–3900)
MCH: 32.5 pg (ref 27.0–33.0)
MCHC: 34.4 g/dL (ref 32.0–36.0)
MCV: 94.3 fL (ref 80.0–100.0)
MPV: 10.4 fL (ref 7.5–12.5)
Monocytes Relative: 8.2 %
Neutro Abs: 5639 cells/uL (ref 1500–7800)
Neutrophils Relative %: 72.3 %
Platelets: 170 10*3/uL (ref 140–400)
RBC: 4.56 10*6/uL (ref 4.20–5.80)
RDW: 13.1 % (ref 11.0–15.0)
Total Lymphocyte: 17.9 %
WBC mixed population: 640 cells/uL (ref 200–950)
WBC: 7.8 10*3/uL (ref 3.8–10.8)

## 2017-11-05 LAB — BRAIN NATRIURETIC PEPTIDE: Brain Natriuretic Peptide: 2151 pg/mL — ABNORMAL HIGH (ref <100)

## 2017-11-05 NOTE — Telephone Encounter (Signed)
Ok, I will enter the referral for home health nursing to come back out to monitor his weights, chf, and recurrent afib.

## 2017-11-08 ENCOUNTER — Telehealth: Payer: Self-pay | Admitting: Physician Assistant

## 2017-11-08 ENCOUNTER — Ambulatory Visit: Payer: Medicare Other | Admitting: Physician Assistant

## 2017-11-08 ENCOUNTER — Encounter: Payer: Self-pay | Admitting: Physician Assistant

## 2017-11-08 VITALS — BP 120/62 | HR 77 | Ht 66.0 in | Wt 184.0 lb

## 2017-11-08 DIAGNOSIS — I38 Endocarditis, valve unspecified: Secondary | ICD-10-CM

## 2017-11-08 DIAGNOSIS — I480 Paroxysmal atrial fibrillation: Secondary | ICD-10-CM | POA: Diagnosis not present

## 2017-11-08 DIAGNOSIS — N183 Chronic kidney disease, stage 3 unspecified: Secondary | ICD-10-CM

## 2017-11-08 DIAGNOSIS — R5383 Other fatigue: Secondary | ICD-10-CM

## 2017-11-08 DIAGNOSIS — I2510 Atherosclerotic heart disease of native coronary artery without angina pectoris: Secondary | ICD-10-CM | POA: Diagnosis not present

## 2017-11-08 DIAGNOSIS — I5022 Chronic systolic (congestive) heart failure: Secondary | ICD-10-CM | POA: Diagnosis not present

## 2017-11-08 NOTE — Telephone Encounter (Signed)
I was further reviewing his chart and believe I misunderstood daughter's question about whether to refill Eliquis. I was under the impression given his hx of atrial fib that Eliquis was a long-term medicine, but it appears this was just added by PCP on 11/04/17 for the question of atrial fib on EKG at that time (I had reviewed with Dr. Johnsie Cancel - DOD - in clinic today who did not feel this presented atrial fib). I will forward to Dr. Radford Pax for her review to advise on long-term plan for Eliquis. He was not on this at his OV 08/2017. Michalene, RN, already called the daughter and told her to stay tuned for further input on whether Eliquis would be continued or not. He has enough to get him through Thursday. I am out the rest of the week but [place] will be covering my box.  Dayna Dunn PA-C

## 2017-11-08 NOTE — Progress Notes (Signed)
Cardiology Office Note    Date:  11/08/2017  ID:  Timothy Suarez, DOB 04/17/1925, MRN [id number] PCP:  Gayland Curry, DO  Cardiologist:  Dr. Radford Pax   Chief Complaint: f/u atrial fib, CHF  History of Present Illness:  Timothy Suarez is a 84 y.o. male with history of nonobstructive CAD 2010, mitral valve prolapse and severe MR s/p Rt mini thoracotamy for mitral valve repair and MAZE procedure 12/2008, chest wall hernia at thoracotamy site s/p repair, paroxysmal atrial fib, wandering atrial pacemaker, PACs, PVCs, chronic systolic CHF, 1st degree AVB, 2nd degree AV block type 1 and type 2, RBBB, mild-mod MR, mod AI, CKD III, colon cancer, HTN, HLD, major depression, mild cognitive impairment, vocal cord cancer, pharyngocutaneous fistula s/p salvage laryngectomy, prostate CA, reported thoracic aortic aneurysm (last echo 08/2017 with only mildly dilated aortic root), thrombocytopenia, LE PAD by imaging 08/2017 who presents for f/u atrial fib/CHF. To recap, notes indicate pre-surgical cath 2010 showed 50-60% of the LAD with negative nuclear stress test at that time. Last nuclear stress test 12/2014 showed no ischemia; + physiologic apical thinning, nongated study. He was last admitted 08/2017 with generalized weakness, hallucinations, weight gain, severe edema, and confusion. He was treated for acute on chronic systolic CHF. 2D Echo 08/13/17 showed mild LVH, EF 20-25%, diffuse HK, moderate AI, mildly dilated aortic root, MV repair with mild-moderate MR, moderately dilated RA, mildly dilated RV, moderately dilated RA, moderate TR/PR, mildly increased PASP, trivial pericardial effusion. (For comparison, EF in 12/2016 was 25-30%, 50-55% in 12/2014.) During admission he was noted to be bradycardic with 2nd degree AVB type 1 and 2 so beta blocker was stopped. The issue of pursuing ischemic workup was discussed with Dr. Radford Pax at visit in 08/2017. Per her note, "His EF has declined some from prior echo.  This is out of  proportion to prior CAD.  We discussed further workup of this today and I recommended continued medical therapy given his advanced age, laryngeal CA and other comorbidities.  He is not a surgical candidate."  I do not see any recent recurrences of atrial fib and he has not been on anticoagulation for quite some time until recently.  EKG in 2018 did demonstrate WAP with PACs/PVCs. He saw PCP 11/04/17 with generalized complaint of fatigue. EKG was felt to possibly represent recurrent atrial fib thus he was sent here. Last labs 11/04/2017 showed Cr 1.15 (baseline 1-1.4), K 4.8, LFTs wnl, CBC wnl, BNP 2151. TSH 08/2017 wnl.   He returns for follow-up today with his daughter. He uses a voicebox microphone to help him speak. He reports having good days and bad days. His main complaint is generalized fatigue. This has been a slow decline over the last few months and seems like this has gotten worse ever since he graduated from PT. It takes a lot of energy for him to do anything and he's left feeling weak/tired by the end of some days. He denies any SOB, palpitations, dizziness, chest pain or syncope. He has chronic edema which he states is at baseline. He reports some chronic orthopnea but thinks this may be related to secretions from his stoma. He used to weigh over 200lb but lost quite a bit over the last year or so. He is steadily gaining it back. He does not have control over the sodium intake in his food at [facility] but does not add salt. He is bothered by the excess urination his Lasix causes but does report compliance with  it.  Past Medical History:  Diagnosis Date  . Arthritis   . Cerebral ischemia   . CKD (chronic kidney disease), stage III ([place])   . Colon cancer [facility])     s/p partial colectomy  . Complication of anesthesia    "woke up w/confusion and hallucinations once after mitral valve OR"  . Coronary artery disease    a. pre-surgical cath 2010 showed 50-60% of the LAD with negative nuclear  stress test at that time. b. normal nuc 2016.  . First degree AV block   . Glaucoma   . Hernia   . HTN (hypertension)    takes Amlodipine daily  . Hyperlipidemia   . Major depression   . Mild cognitive impairment   . Mitral regurgitation    a. mild-mod MR by echo 08/2017.  . Mobitz type 1 second degree atrioventricular block   . Mobitz type 2 second degree atrioventricular block   . Moderate aortic insufficiency   . MVP (mitral valve prolapse)    S/P Rt mini thoractomy for Mitral Valve repair  . Paroxysmal atrial fibrillation (HCC)    S/P Maze procedure  . 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 Premature atrial contractions   . Prostate cancer ([place])   . PVC's (premature ventricular contractions)   . RBBB   . 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 Sleep disturbance   . Thoracic aortic aneurysm ([place])    a. last echo 08/2017: mildly dilated aortic root.  . Thrombocytopenia ([place])   . Wandering atrial pacemaker     Past Surgical History:  Procedure Laterality Date  . CATARACT EXTRACTION W/ INTRAOCULAR LENS  IMPLANT, BILATERAL Bilateral   . COLECTOMY  2004   Dr [name]  . DIRECT LARYNGOSCOPY N/A 01/15/2016   Procedure: DIRECT LARYNGOSCOPY WITH BIOPSY AND FROZEN SECTION;  Surgeon: Izora Gala, MD;  Location: [place];  Service: ENT;  Laterality: N/A;  . FlexHD patch repair of chest wall hernia.  01/28/2011   [name]  . GASTROSTOMY W/ FEEDING TUBE    . IR GENERIC HISTORICAL  10/08/2016   IR GASTROSTOMY TUBE REMOVAL 10/08/2016 Sandi Mariscal, MD [facility]-INTERV RAD  . JOINT REPLACEMENT    . LARYNGETOMY N/A 01/15/2016   Procedure:  TOTAL LARYNGECTOMY;  Surgeon: Izora Gala, MD;  Location: [facility] OR;  Service: ENT;  Laterality: N/A;  . MAZE  12/27/2008   left side lesion set  . MICROLARYNGOSCOPY Right 01/29/2015   Procedure: MICROLARYNGOSCOPY WITH BIOSPY OF RIGHT VOCAL CORD;  Surgeon: Izora Gala, MD;  Location: [place];  Service: ENT;   Laterality: Right;  . MITRAL VALVE REPAIR  12/27/2008   complex valvuloplasty with 27mm Memo 3D annuloplasty ring via right minithoracotomy  . PECTORALIS FLAP Left 05/04/2016   Procedure: PECTORALIS FLAP to neck with possible, split thickness skin graft;  Surgeon: Loel Lofty Dillingham, DO;  Location: [place];  Service: Plastics;  Laterality: Left;  . PROSTATE BIOPSY    . SKIN SPLIT GRAFT Left 05/04/2016   Procedure: PECTORALIS MAJOR MYOCUTANEOUS FLAP RECONSTRUCTION OF PHARYNX AND SPLIT THICKNESS SKIN GRAFT;  Surgeon: Izora Gala, MD;  Location: [place];  Service: ENT;  Laterality: Left;  . TEE WITHOUT CARDIOVERSION N/A 01/03/2013   Procedure: TRANSESOPHAGEAL ECHOCARDIOGRAM (TEE);  Surgeon: Sueanne Margarita, MD;  Location: [place];  Service: Cardiovascular;  Laterality: N/A;  . TOTAL KNEE ARTHROPLASTY Right 2003  . TRACHEAL ESOPHAGEAL PUNCTURE REPAIR N/A 03/26/2016   Procedure:  TRACHEAL ESOPHAGEAL PUNCTURE;  Surgeon: Izora Gala, MD;  Location: [place];  Service: ENT;  Laterality: N/A;  . TRACHEOESOPHAGEAL FISTULA REPAIR N/A 03/26/2016   Procedure: TRACHEO-ESOPHAGEAL   PUNCTURE,FISTULAR CLOSURE;  Surgeon: Izora Gala, MD;  Location: [place];  Service: ENT;  Laterality: N/A;  . TRACHEOESOPHAGEAL FISTULA REPAIR N/A 04/09/2016   Procedure: FISTULA REPAIR WITH  MUSCLE ROTATION FLAP;  Surgeon: Izora Gala, MD;  Location: [place];  Service: ENT;  Laterality: N/A;  . TRACHEOESOPHAGEAL FISTULA REPAIR N/A 07/27/2016   Procedure: CLOSURE OF FISTULA;  Surgeon: Izora Gala, MD;  Location: [place];  Service: ENT;  Laterality: N/A;  . TYMPANOPLASTY  1967   "? side"    Current Medications: Current Meds  Medication Sig  . apixaban (ELIQUIS) 5 MG TABS tablet Take 1 tablet (5 mg total) by mouth 2 (two) times daily.  Marland Kitchen atorvastatin (LIPITOR) 40 MG tablet Take 1 tablet (40 mg total) by mouth daily.  . furosemide (LASIX) 40 MG tablet Take 1 tablet (40 mg total) by mouth every morning AND 0.5 tablets (20 mg total) every  evening.  Marland Kitchen lisinopril (PRINIVIL,ZESTRIL) 2.5 MG tablet Take 1 tablet (2.5 mg total) by mouth daily.     Allergies:   Ambien [zolpidem]; Metoprolol; Citalopram; Trazodone and nefazodone; and Zoloft [sertraline hcl]   Social History   Socioeconomic History  . Marital status: Widowed    Spouse name: Not on file  . Number of children: 2  . Years of education: 39  . Highest education level: Not on file  Occupational History  . Occupation: retired Dispensing optician  . Financial resource strain: Not on file  . Food insecurity:    Worry: Not on file    Inability: Not on file  . Transportation needs:    Medical: Not on file    Non-medical: Not on file  Tobacco Use  . Smoking status: Never Smoker  . Smokeless tobacco: Never Used  Substance and Sexual Activity  . Alcohol use: Yes    Alcohol/week: 0.0 oz    Comment: rare  . Drug use: No  . Sexual activity: Never  Lifestyle  . Physical activity:    Days per week: Not on file    Minutes per session: Not on file  . Stress: Not on file  Relationships  . Social connections:    Talks on phone: Not on file    Gets together: Not on file    Attends religious service: Not on file    Active member of club or organization: Not on file    Attends meetings of clubs or organizations: Not on file    Relationship status: Not on file  Other Topics Concern  . Not on file  Social History Narrative   Widowed   Never smoked   Alcohol  Occasionally    Exercise - walking the [employer] retail with cane   [place], HCPOA              Family History:  Family History  Problem Relation Age of Onset  . Cancer Mother        lymphoma  . Cancer Father        pancreatic  . Heart attack Neg Hx   . Stroke Neg Hx     ROS:   Please see the history of present illness.  All other systems are reviewed and otherwise negative.    PHYSICAL EXAM:  VS:  BP 120/62   Pulse 77   Ht 5\' 6"  (1.676 m)   Wt 184 lb (83.5  kg)   SpO2 97%   BMI 29.70 kg/m   BMI: Body mass index is 29.7 kg/m. GEN: Frail elderly WM in no acute distress  HEENT: normocephalic Neck: no JVD, carotid bruits, or masses - stoma in place and speaks with voicebox microphone Cardiac: RRR; no murmurs, rubs, or gallops, 1+ BLE edema (stiff) Respiratory:  clear to auscultation bilaterally, normal work of breathing GI: soft, nontender, nondistended, + BS MS: no deformity or atrophy  Skin: warm and dry, no rash Neuro:  Alert and Oriented x 3, Strength and sensation are intact, follows commands Psych: euthymic mood, full affect  Wt Readings from Last 3 Encounters:  11/08/17 184 lb (83.5 kg)  11/04/17 180 lb (81.6 kg)  09/16/17 172 lb 6.4 oz (78.2 kg)      Studies/Labs Reviewed:   EKG:  EKG was ordered today and personally reviewed by me and demonstrates NSR 77bpm with first degree AV block and frequent PACs/PVCs. PCP EKG reviewed and appears similar. Reviewed with Dr. Johnsie Cancel, no evidence for recurrent atrial fib.  Recent Labs: 12/06/2016: Magnesium 2.1 08/27/2017: TSH 5.26 11/04/2017: ALT 6; Brain Natriuretic Peptide 2,151; BUN 20; Creat 1.15; Hemoglobin 14.8; Platelets 170; Potassium 4.8; Sodium 142   Lipid Panel    Component Value Date/Time   CHOL 141 12/21/2016 0949   CHOL 144 08/26/2015 1042   TRIG 62 12/21/2016 0949   HDL 52 12/21/2016 0949   HDL 49 08/26/2015 1042   CHOLHDL 2.7 12/21/2016 0949   VLDL 12 12/21/2016 0949   LDLCALC 77 12/21/2016 0949   LDLCALC 68 08/26/2015 1042    Additional studies/ records that were reviewed today include: Summarized above.    ASSESSMENT & PLAN:   1. Fatigue - he reports a generalized decline for the last several months, worse since he stopped PT. I am not sure I can totally explain that from one explicit cardiac cause. Per review with Dr. Johnsie Cancel, it does not appear he is back in atrial fib. His BNP is elevated so there may be an element of CHF here, possibly low-output state. I  do not think he would be a candidate for advanced therapies as they would not likely change his long-term course. He expresses hesitance towards titration of diuretic. I do not think his BP would tolerate Entresto. Will start conservatively with a trial of increase in Lasix from 40/20 to 40mg  BID x 3 days then return to regular dose. We did discuss possibility of a 24-hour holter to exclude recurrent bradyarrhythmias causing his fatigue. That being said, his advanced age and comorbidities make it challenging to know what to do with this information if obtained. I am concerned about general failure to thrive especially in the setting of known prior malignancies. I have encouraged him to get back on board with PT if possible to help his functional abilities maintained. 2. Chronic systolic CHF - see above. Reviewed 2g sodium restriction, 2L fluid restriction, daily weights with patient.  3. Paroxysmal atrial fibrillation - per review with Dr. Johnsie Cancel, EKG from 3/28 and today felt to represent NSR with ectopy. Upon finishing this note, I realized what his daughter was asking me during the visit about Eliquis. She asked me if he should continue this. I assumed he had been on this long term but it was only recently started by PCP under the presumption that EKG had shown recurrent atrial  fib. I will reach out to Dr. Radford Pax (see phone note) to review. 4. CAD - nonobstructive by remote cath. I agree with Dr. Radford Pax in conservative management. I do not think an ischemic workup would really change his clinical course. 5. Valvular disease s/p MV repair, with mild-mod MR, moderate AI - follow clinically. 6. CKD III - recently stable by labs.  Disposition: F/u with Dr. Abbey Chatters in 4 weeks.   Medication Adjustments/Labs and Tests Ordered: Current medicines are reviewed at length with the patient today.  Concerns regarding medicines are outlined above. Medication changes, Labs and Tests ordered today are summarized  above and listed in the Patient Instructions accessible in Encounters.   Signed, Charlie Pitter, PA-C  11/08/2017 2:44 PM    [facility] [address], [place], [place]  [address] Phone: [phone]; Fax: [phone]

## 2017-11-08 NOTE — Patient Instructions (Signed)
Your physician has recommended you make the following change in your medication:  1.) FOR THREE (3) DAYS:  Increase furosemide (lasix) to 40 mg twice a day, then go back to your prior dose  Your physician recommends that you schedule a follow-up appointment in: 1 month with Dr. Radford Pax or APP on a day Dr. Radford Pax is in office.

## 2017-11-09 ENCOUNTER — Telehealth: Payer: Self-pay | Admitting: *Deleted

## 2017-11-09 DIAGNOSIS — M7122 Synovial cyst of popliteal space [Baker], left knee: Secondary | ICD-10-CM

## 2017-11-09 NOTE — Telephone Encounter (Signed)
Patient daughter, Timothy Suarez called and stated that patient would like for you to place a referral for him to go to [facility] to have his Baker's Cyst on Left Knee drained. Stated he was having a hard time walking with it.  Also they wanted to make sure you placed an order for PT/Nurse. Please Advise.

## 2017-11-09 NOTE — Telephone Encounter (Signed)
Order for baker's cyst aspiration and injection entered today.  Order for referral for home health nursing was placed last week.

## 2017-11-10 NOTE — Telephone Encounter (Signed)
Reviewed EKGs and all appear to be sinus rhythm with frequent PACs.  I would recommend that we get a 30-day event monitor to rule out silent A. fib.

## 2017-11-10 NOTE — Telephone Encounter (Signed)
Spoke with patient's daughter, Miguel Dibble. We discussed discontinuing Eliquis and placing the 30 day monitor. She was agreeable in stopping the Eliquis but was not sure the 30 day monitor is something her dad would like to do, or could maintain on his own. I advised her to speak with her dad and call us back to let us know. We can schedule him to come in to have it placed at that time.

## 2017-11-10 NOTE — Telephone Encounter (Signed)
LM on pt's VM for return call

## 2017-11-10 NOTE — Telephone Encounter (Signed)
[place] Notified and agreed.

## 2017-11-10 NOTE — Telephone Encounter (Signed)
   Covering for [place] - Please let the patient know we talked with Dr. Radford Pax and most recent EKG's were not thought to be consistent with atrial fibrillation, therefore no indication for Eliquis at this time and he can discontinue. Dr. Radford Pax did recommend he have a 30-day event monitor to rule-out any possible recurrent atrial fibrillation.   Thanks,  Erma Heritage, PA-C 11/10/2017, 10:33 AM

## 2017-11-11 ENCOUNTER — Other Ambulatory Visit: Payer: Self-pay | Admitting: Internal Medicine

## 2017-11-11 DIAGNOSIS — M7122 Synovial cyst of popliteal space [Baker], left knee: Secondary | ICD-10-CM

## 2017-11-11 NOTE — Telephone Encounter (Signed)
Daughter, Hilda Blades called and stated that [facility] did not have the order for the Aspiration.   I called [facility] and spoke with Jocelyn Lamer and they have changed the codes for this procedure it is U/S Guided Needle Placement. She is going to place the order and send it to Dr. Mariea Clonts to sign off. She is also calling the daughter to schedule an appointment.

## 2017-11-11 NOTE — Addendum Note (Signed)
Addended by: Rafael Bihari A on: 11/11/2017 03:18 PM   Modules accepted: Orders

## 2017-11-11 NOTE — Addendum Note (Signed)
Addended by: De Burrs on: 11/11/2017 01:46 PM   Modules accepted: Orders

## 2017-11-12 NOTE — Telephone Encounter (Signed)
Orders were signed yesterday.

## 2017-11-14 DIAGNOSIS — Z85038 Personal history of other malignant neoplasm of large intestine: Secondary | ICD-10-CM | POA: Diagnosis not present

## 2017-11-14 DIAGNOSIS — Z8521 Personal history of malignant neoplasm of larynx: Secondary | ICD-10-CM | POA: Diagnosis not present

## 2017-11-14 DIAGNOSIS — M7122 Synovial cyst of popliteal space [Baker], left knee: Secondary | ICD-10-CM | POA: Diagnosis not present

## 2017-11-14 DIAGNOSIS — I5023 Acute on chronic systolic (congestive) heart failure: Secondary | ICD-10-CM | POA: Diagnosis not present

## 2017-11-14 DIAGNOSIS — I2510 Atherosclerotic heart disease of native coronary artery without angina pectoris: Secondary | ICD-10-CM | POA: Diagnosis not present

## 2017-11-14 DIAGNOSIS — I110 Hypertensive heart disease with heart failure: Secondary | ICD-10-CM | POA: Diagnosis not present

## 2017-11-14 DIAGNOSIS — R531 Weakness: Secondary | ICD-10-CM | POA: Diagnosis not present

## 2017-11-14 DIAGNOSIS — F329 Major depressive disorder, single episode, unspecified: Secondary | ICD-10-CM

## 2017-11-14 DIAGNOSIS — G3184 Mild cognitive impairment, so stated: Secondary | ICD-10-CM | POA: Diagnosis not present

## 2017-11-14 DIAGNOSIS — Z9002 Acquired absence of larynx: Secondary | ICD-10-CM | POA: Diagnosis not present

## 2017-11-14 DIAGNOSIS — R5383 Other fatigue: Secondary | ICD-10-CM | POA: Diagnosis not present

## 2017-11-14 DIAGNOSIS — I480 Paroxysmal atrial fibrillation: Secondary | ICD-10-CM | POA: Diagnosis not present

## 2017-11-15 ENCOUNTER — Ambulatory Visit (INDEPENDENT_AMBULATORY_CARE_PROVIDER_SITE_OTHER): Payer: Medicare Other | Admitting: Internal Medicine

## 2017-11-15 ENCOUNTER — Encounter: Payer: Self-pay | Admitting: Internal Medicine

## 2017-11-15 VITALS — BP 112/60 | HR 68 | Temp 98.5°F | Ht 66.0 in | Wt 184.0 lb

## 2017-11-15 DIAGNOSIS — R490 Dysphonia: Secondary | ICD-10-CM | POA: Insufficient documentation

## 2017-11-15 DIAGNOSIS — I480 Paroxysmal atrial fibrillation: Secondary | ICD-10-CM

## 2017-11-15 DIAGNOSIS — I491 Atrial premature depolarization: Secondary | ICD-10-CM | POA: Diagnosis not present

## 2017-11-15 DIAGNOSIS — I5023 Acute on chronic systolic (congestive) heart failure: Secondary | ICD-10-CM

## 2017-11-15 DIAGNOSIS — R1312 Dysphagia, oropharyngeal phase: Secondary | ICD-10-CM | POA: Insufficient documentation

## 2017-11-15 DIAGNOSIS — R5383 Other fatigue: Secondary | ICD-10-CM

## 2017-11-15 NOTE — Progress Notes (Signed)
Location:  [facility] clinic Provider:  Tiffany L. Mariea Clonts, D.O., C.M.D.  Code Status: Full Code  Goals of Care:  Advanced Directives 11/15/2017  Does Patient Have a Medical Advance Directive? Yes  Type of Advance Directive Living will  Does patient want to make changes to medical advance directive? No - Patient declined  Copy of [place] in Chart? -  Pre-existing out of facility DNR order (yellow form or pink MOST form) -     Chief Complaint  Patient presents with  . Follow-up    2 week for heart and afib  . ACP    LIVING WILL    HPI: Patient is a 84 y.o. male seen today for medical management of chronic diseases. Post visit with Cardiology. He was found to not be in A. Fib per their notes. He was found in NR with frequent A fib. He was also thought to have some CHF and was given some lasix increased over a couple days. He reports no change post this intervention. He continues to feel the same with fatigue. His energy level waxes and wanes from day to day. Dr. Radford Pax suggested a Holter monitor for 21 days. But today in office he feels he does not need this.  He is no longer on Eliquis.   Would like PT to return to help get him in shape, this was ordered last visit. But is still pending. Home Health referral still in. Nurse came yesterday for the first time.     Past Medical History:  Diagnosis Date  . Arthritis   . Cerebral ischemia   . CKD (chronic kidney disease), stage III ([place])   . Colon cancer [facility])     s/p partial colectomy  . Complication of anesthesia    "woke up w/confusion and hallucinations once after mitral valve OR"  . Coronary artery disease    a. pre-surgical cath 2010 showed 50-60% of the LAD with negative nuclear stress test at that time. b. normal nuc 2016.  . First degree AV block   . Glaucoma   . Hernia   . HTN (hypertension)    takes Amlodipine daily  . Hyperlipidemia   . Major depression   . Mild cognitive impairment   . Mitral  regurgitation    a. mild-mod MR by echo 08/2017.  . Mobitz type 1 second degree atrioventricular block   . Mobitz type 2 second degree atrioventricular block   . Moderate aortic insufficiency   . MVP (mitral valve prolapse)    S/P Rt mini thoractomy for Mitral Valve repair  . Paroxysmal atrial fibrillation (HCC)    S/P Maze procedure  . 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 Premature atrial contractions   . Prostate cancer ([place])   . PVC's (premature ventricular contractions)   . RBBB   . 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 Sleep disturbance   . Thoracic aortic aneurysm ([place])    a. last echo 08/2017: mildly dilated aortic root.  . Thrombocytopenia ([place])   . Wandering atrial pacemaker     Past Surgical History:  Procedure Laterality Date  . CATARACT EXTRACTION W/ INTRAOCULAR LENS  IMPLANT, BILATERAL Bilateral   . COLECTOMY  2004   Dr [name]  . DIRECT LARYNGOSCOPY N/A 01/15/2016   Procedure: DIRECT LARYNGOSCOPY WITH BIOPSY AND FROZEN SECTION;  Surgeon: Izora Gala, MD;  Location: [place];  Service: ENT;  Laterality: N/A;  .  FlexHD patch repair of chest wall hernia.  01/28/2011   [name]  . GASTROSTOMY W/ FEEDING TUBE    . IR GENERIC HISTORICAL  10/08/2016   IR GASTROSTOMY TUBE REMOVAL 10/08/2016 Sandi Mariscal, MD [facility]-INTERV RAD  . JOINT REPLACEMENT    . LARYNGETOMY N/A 01/15/2016   Procedure:  TOTAL LARYNGECTOMY;  Surgeon: Izora Gala, MD;  Location: [facility] OR;  Service: ENT;  Laterality: N/A;  . MAZE  12/27/2008   left side lesion set  . MICROLARYNGOSCOPY Right 01/29/2015   Procedure: MICROLARYNGOSCOPY WITH BIOSPY OF RIGHT VOCAL CORD;  Surgeon: Izora Gala, MD;  Location: [place];  Service: ENT;  Laterality: Right;  . MITRAL VALVE REPAIR  12/27/2008   complex valvuloplasty with 83mm Memo 3D annuloplasty ring via right minithoracotomy  . PECTORALIS FLAP Left 05/04/2016   Procedure: PECTORALIS FLAP to neck with possible, split  thickness skin graft;  Surgeon: Loel Lofty Dillingham, DO;  Location: [place];  Service: Plastics;  Laterality: Left;  . PROSTATE BIOPSY    . SKIN SPLIT GRAFT Left 05/04/2016   Procedure: PECTORALIS MAJOR MYOCUTANEOUS FLAP RECONSTRUCTION OF PHARYNX AND SPLIT THICKNESS SKIN GRAFT;  Surgeon: Izora Gala, MD;  Location: [place];  Service: ENT;  Laterality: Left;  . TEE WITHOUT CARDIOVERSION N/A 01/03/2013   Procedure: TRANSESOPHAGEAL ECHOCARDIOGRAM (TEE);  Surgeon: Sueanne Margarita, MD;  Location: [place];  Service: Cardiovascular;  Laterality: N/A;  . TOTAL KNEE ARTHROPLASTY Right 2003  . TRACHEAL ESOPHAGEAL PUNCTURE REPAIR N/A 03/26/2016   Procedure: TRACHEAL ESOPHAGEAL PUNCTURE;  Surgeon: Izora Gala, MD;  Location: [facility] OR;  Service: ENT;  Laterality: N/A;  . TRACHEOESOPHAGEAL FISTULA REPAIR N/A 03/26/2016   Procedure: TRACHEO-ESOPHAGEAL   PUNCTURE,FISTULAR CLOSURE;  Surgeon: Izora Gala, MD;  Location: [facility] OR;  Service: ENT;  Laterality: N/A;  . TRACHEOESOPHAGEAL FISTULA REPAIR N/A 04/09/2016   Procedure: FISTULA REPAIR WITH  MUSCLE ROTATION FLAP;  Surgeon: Izora Gala, MD;  Location: [place];  Service: ENT;  Laterality: N/A;  . TRACHEOESOPHAGEAL FISTULA REPAIR N/A 07/27/2016   Procedure: CLOSURE OF FISTULA;  Surgeon: Izora Gala, MD;  Location: [place];  Service: ENT;  Laterality: N/A;  . TYMPANOPLASTY  1967   "? side"    Allergies  Allergen Reactions  . Ambien [Zolpidem] Other (See Comments)    Causes confusion  . Metoprolol Other (See Comments)    Hypersensitive to beta blockers with associated hypotension and bradycardia  . Citalopram Nausea Only  . Trazodone And Nefazodone Other (See Comments)    Dry mouth  . Zoloft [Sertraline Hcl] Other (See Comments)    dizzy    Outpatient Encounter Medications as of 11/15/2017  Medication Sig  . apixaban (ELIQUIS) 5 MG TABS tablet Take 1 tablet (5 mg total) by mouth 2 (two) times daily.  Marland Kitchen atorvastatin (LIPITOR) 40 MG tablet Take 1 tablet (40 mg total)  by mouth daily.  . furosemide (LASIX) 40 MG tablet Take 1 tablet (40 mg total) by mouth every morning AND 0.5 tablets (20 mg total) every evening.  Marland Kitchen lisinopril (PRINIVIL,ZESTRIL) 2.5 MG tablet Take 1 tablet (2.5 mg total) by mouth daily.   No facility-administered encounter medications on file as of 11/15/2017.     Review of Systems:  Review of Systems  Constitutional: Positive for malaise/fatigue. Negative for chills and fever.  HENT: Positive for hearing loss.        Hearing aids Has artificial larynx    Eyes:       Glasses  Respiratory: Negative for cough and shortness  of breath.   Cardiovascular: Positive for leg swelling. Negative for chest pain and palpitations.  Neurological: Negative for dizziness and headaches.  Psychiatric/Behavioral: Positive for memory loss. Negative for depression. The patient is not nervous/anxious and does not have insomnia.   All other systems reviewed and are negative.   Health Maintenance  Topic Date Due  . INFLUENZA VACCINE  03/10/2018  . TETANUS/TDAP  08/11/2023  . PNA vac Low Risk Adult  Completed    Physical Exam: Vitals:   11/15/17 1351  BP: 112/60  Pulse: 68  Temp: 98.5 F (36.9 C)  TempSrc: Oral  SpO2: 94%  Weight: 184 lb (83.5 kg)   Body mass index is 29.7 kg/m. Physical Exam  Constitutional: He is oriented to person, place, and time. He appears well-developed and well-nourished.  HENT:  Head: Normocephalic.  Tracheostomy; has autolarynx device to speak with   Cardiovascular: Normal rate, regular rhythm and intact distal pulses. Frequent extrasystoles are present.  Ankle swelling with compression hose on  Pulmonary/Chest: Effort normal and breath sounds normal.  Musculoskeletal: Normal range of motion.  Neurological: He is alert and oriented to person, place, and time.  Skin: Skin is warm and dry. Capillary refill takes less than 2 seconds.  Psychiatric: He has a normal mood and affect. His behavior is normal. Judgment and  thought content normal.  Vitals reviewed.   Labs reviewed: Basic Metabolic Panel: Recent Labs    12/06/16 1048  07/08/17 0859  08/11/17 1819  08/16/17 0720 08/27/17 09/09/17 1205 11/04/17 1225  NA 138   < > 141   < >  --    < > 142  --  140 142  K 3.8   < > 4.4   < >  --    < > 3.5  --  4.1 4.8  CL 109   < > 104   < >  --    < > 106  --  100 106  CO2 21*   < > 28   < >  --    < > 26  --  24 27  GLUCOSE 88   < > 92   < >  --    < > 84  --  98 89  BUN 22*   < > 22   < >  --    < > 32*  --  24 20  CREATININE 0.88   < > 1.13*   < >  --    < > 1.35*  --  1.06 1.15*  CALCIUM 7.8*   < > 9.2   < >  --    < > 8.2*  --  8.7 9.1  MG 2.1  --   --   --   --   --   --   --   --   --   PHOS 2.9  --   --   --   --   --   --   --   --   --   TSH  --    < > 8.07*  --  7.512*  --   --  5.26  --   --    < > = values in this interval not displayed.   Liver Function Tests: Recent Labs    12/08/16 0238 03/23/17 1422 08/10/17 2037 11/04/17 1225  AST 17 18 23 13   ALT 18 15 15* 6*  ALKPHOS 68 94 93  --   BILITOT  0.8 1.1 1.4* 1.0  PROT 5.9* 6.2 6.4* 6.7  ALBUMIN 2.4* 3.7 3.4*  --    No results for input(s): LIPASE, AMYLASE in the last 8760 hours. No results for input(s): AMMONIA in the last 8760 hours. CBC: Recent Labs    07/08/17 0859  08/11/17 0543  08/15/17 0407 08/16/17 0720 11/04/17 1225  WBC 7.5   < > 7.3   < > 7.9 6.8 7.8  NEUTROABS 5,325  --  5.6  --   --   --  5,639  HGB 14.4   < > 15.6   < > 15.1 14.3 14.8  HCT 43.2   < > 47.0   < > 47.1 43.9 43.0  MCV 93.7   < > 97.5   < > 98.1 99.5 94.3  PLT 182   < > 128*   < > 127* 109* 170   < > = values in this interval not displayed.   Lipid Panel: Recent Labs    12/21/16 0949  CHOL 141  HDL 52  LDLCALC 77  TRIG 62  CHOLHDL 2.7   Lab Results  Component Value Date   HGBA1C 5.5 08/11/2017    Procedures since last visit: No results found.  Assessment/Plan  1. Paroxysmal atrial fibrillation [facility]) Per Cardiologist Dr  Radford Pax, he did not appear to be in a fib on EKG review. Rather NR with frequent PAC's. But she suggested an Holter monitor to check for silent a fib. He rather not deal with this and declined the monitor. Eliquis was stopped due to the findings at Cards office. Did provide education on the risk of A fib should it be there. He and daughter verbalized understanding.   2. Systolic CHF, acute on chronic (HCC) He appeared to have some volume and Dayna Dunn PA ordered some Lasix increase over a few days to help with this. He reported not feeling any better post this. He presents with bilateral edema of the ankles with compression hose on.   3. Fatigue, unspecified type He reports good and bad days. He is having a good day today in the office. Previous visit was a bad one. PT was ordered still pending.   4. PAC (premature atrial contraction) He is having frequent PAC's today in the office rate of 68-71 bpm. He is without dizziness or fatigue today. Will continue to monitor this over the summer. Advise to call the office should he start feeling bad again, notices a change in HR, or decides he would like the monitor after all.     Labs/tests ordered:  No orders of the defined types were placed in this encounter.    Next appt:  04/13/2018   Karen Kays, RN, DNP Student Ger[employer]s [facility] [phone], [place] [address] Cell Phone (Mon-Fri 8am-5pm):  [phone] On Call:  [phone] & follow prompts after 5pm & weekends Office Phone:  [phone] Office Fax:  [phone]

## 2017-11-16 ENCOUNTER — Encounter: Payer: Self-pay | Admitting: Internal Medicine

## 2017-11-16 NOTE — Addendum Note (Signed)
Addended by: De Burrs on: 11/16/2017 09:09 AM   Modules accepted: Orders

## 2017-11-18 ENCOUNTER — Telehealth: Payer: Self-pay | Admitting: Cardiology

## 2017-11-18 DIAGNOSIS — Z85038 Personal history of other malignant neoplasm of large intestine: Secondary | ICD-10-CM | POA: Diagnosis not present

## 2017-11-18 DIAGNOSIS — R531 Weakness: Secondary | ICD-10-CM | POA: Diagnosis not present

## 2017-11-18 DIAGNOSIS — M7122 Synovial cyst of popliteal space [Baker], left knee: Secondary | ICD-10-CM | POA: Diagnosis not present

## 2017-11-18 DIAGNOSIS — I110 Hypertensive heart disease with heart failure: Secondary | ICD-10-CM | POA: Diagnosis not present

## 2017-11-18 DIAGNOSIS — I5023 Acute on chronic systolic (congestive) heart failure: Secondary | ICD-10-CM | POA: Diagnosis not present

## 2017-11-18 DIAGNOSIS — R5383 Other fatigue: Secondary | ICD-10-CM | POA: Diagnosis not present

## 2017-11-18 DIAGNOSIS — G3184 Mild cognitive impairment, so stated: Secondary | ICD-10-CM | POA: Diagnosis not present

## 2017-11-18 DIAGNOSIS — I2510 Atherosclerotic heart disease of native coronary artery without angina pectoris: Secondary | ICD-10-CM | POA: Diagnosis not present

## 2017-11-18 DIAGNOSIS — I480 Paroxysmal atrial fibrillation: Secondary | ICD-10-CM | POA: Diagnosis not present

## 2017-11-18 DIAGNOSIS — Z9002 Acquired absence of larynx: Secondary | ICD-10-CM | POA: Diagnosis not present

## 2017-11-18 DIAGNOSIS — Z8521 Personal history of malignant neoplasm of larynx: Secondary | ICD-10-CM | POA: Diagnosis not present

## 2017-11-18 NOTE — Telephone Encounter (Signed)
Encourage patient to go back on anticoagulation

## 2017-11-18 NOTE — Telephone Encounter (Signed)
I spoke with Langley Gauss who states that patient is back in afib. I informed her that patient was on eliquis and he stop taking it. Patient also refused the 30 day monitor. She states she will call patient's daughter to follow up on encouraging patient to wear 30 day monitor and take blood thinner. She states she will call back if patient is in agreement.

## 2017-11-18 NOTE — Telephone Encounter (Signed)
New Message   Patient c/o Palpitations:  High priority if patient c/o lightheadedness, shortness of breath, or chest pain  1) How long have you had palpitations/irregular HR/ Afib? Are you having the symptoms now?  Timothy Suarez with [facility] said today that patients HR is irregular today with her home visit. It was in the 60s.   2) Are you currently experiencing lightheadedness, SOB or CP? No, chest pain. Lungs are clear.   3) Do you have a history of afib (atrial fibrillation) or irregular heart rhythm? Afib  4) Have you checked your BP or HR? (document readings if available): HR 55 BP 118/60  5) Are you experiencing any other symptoms? Fatigue on exertion

## 2017-11-19 MED ORDER — APIXABAN 5 MG PO TABS: 5.0000 mg | ORAL_TABLET | Freq: Two times a day (BID) | ORAL | 1 refills | Status: DC

## 2017-11-19 NOTE — Addendum Note (Signed)
Addended by: Teressa Senter on: 11/19/2017 11:30 AM   Modules accepted: Orders

## 2017-11-19 NOTE — Telephone Encounter (Signed)
I spoke with [name], patient's daughter (dpr on file). I explained that per Dr. Radford Pax patient needs to restart his Eliquis due to recurrent afib. She is in agreement with treatment plan and thankful for the call. Patient has f/u with Dr. Radford Pax on 12/28/17.

## 2017-11-22 DIAGNOSIS — I480 Paroxysmal atrial fibrillation: Secondary | ICD-10-CM | POA: Diagnosis not present

## 2017-11-22 DIAGNOSIS — R5383 Other fatigue: Secondary | ICD-10-CM | POA: Diagnosis not present

## 2017-11-22 DIAGNOSIS — Z85038 Personal history of other malignant neoplasm of large intestine: Secondary | ICD-10-CM | POA: Diagnosis not present

## 2017-11-22 DIAGNOSIS — I2510 Atherosclerotic heart disease of native coronary artery without angina pectoris: Secondary | ICD-10-CM | POA: Diagnosis not present

## 2017-11-22 DIAGNOSIS — M7122 Synovial cyst of popliteal space [Baker], left knee: Secondary | ICD-10-CM | POA: Diagnosis not present

## 2017-11-22 DIAGNOSIS — I5023 Acute on chronic systolic (congestive) heart failure: Secondary | ICD-10-CM | POA: Diagnosis not present

## 2017-11-22 DIAGNOSIS — R531 Weakness: Secondary | ICD-10-CM | POA: Diagnosis not present

## 2017-11-22 DIAGNOSIS — I110 Hypertensive heart disease with heart failure: Secondary | ICD-10-CM | POA: Diagnosis not present

## 2017-11-22 DIAGNOSIS — G3184 Mild cognitive impairment, so stated: Secondary | ICD-10-CM | POA: Diagnosis not present

## 2017-11-22 DIAGNOSIS — Z8521 Personal history of malignant neoplasm of larynx: Secondary | ICD-10-CM | POA: Diagnosis not present

## 2017-11-22 DIAGNOSIS — Z9002 Acquired absence of larynx: Secondary | ICD-10-CM | POA: Diagnosis not present

## 2017-11-23 ENCOUNTER — Other Ambulatory Visit: Payer: Self-pay | Admitting: Internal Medicine

## 2017-11-23 ENCOUNTER — Ambulatory Visit
Admission: RE | Admit: 2017-11-23 | Discharge: 2017-11-23 | Disposition: A | Discharge status: Home / Self Care | Payer: Medicare Other | Source: Ambulatory Visit | Attending: Internal Medicine | Admitting: Internal Medicine

## 2017-11-23 DIAGNOSIS — G3184 Mild cognitive impairment, so stated: Secondary | ICD-10-CM | POA: Diagnosis not present

## 2017-11-23 DIAGNOSIS — M7122 Synovial cyst of popliteal space [Baker], left knee: Secondary | ICD-10-CM

## 2017-11-23 DIAGNOSIS — I2510 Atherosclerotic heart disease of native coronary artery without angina pectoris: Secondary | ICD-10-CM | POA: Diagnosis not present

## 2017-11-23 DIAGNOSIS — I5023 Acute on chronic systolic (congestive) heart failure: Secondary | ICD-10-CM | POA: Diagnosis not present

## 2017-11-23 DIAGNOSIS — R531 Weakness: Secondary | ICD-10-CM | POA: Diagnosis not present

## 2017-11-23 DIAGNOSIS — I480 Paroxysmal atrial fibrillation: Secondary | ICD-10-CM | POA: Diagnosis not present

## 2017-11-23 DIAGNOSIS — I110 Hypertensive heart disease with heart failure: Secondary | ICD-10-CM | POA: Diagnosis not present

## 2017-11-23 DIAGNOSIS — R5383 Other fatigue: Secondary | ICD-10-CM | POA: Diagnosis not present

## 2017-11-23 DIAGNOSIS — Z9002 Acquired absence of larynx: Secondary | ICD-10-CM | POA: Diagnosis not present

## 2017-11-23 DIAGNOSIS — Z85038 Personal history of other malignant neoplasm of large intestine: Secondary | ICD-10-CM | POA: Diagnosis not present

## 2017-11-23 DIAGNOSIS — Z8521 Personal history of malignant neoplasm of larynx: Secondary | ICD-10-CM | POA: Diagnosis not present

## 2017-11-24 ENCOUNTER — Telehealth: Payer: Self-pay | Admitting: Cardiology

## 2017-11-24 NOTE — Telephone Encounter (Signed)
Patient with diagnosis of atrial fibrillation on Eliquis for anticoagulation.    Procedure: bakers cyst aspiration and injection Date of procedure: TBD  CHADS2-VASc score of  5 (CHF, HTN, AGE,  CAD, AGE,)  CrCl 50.4 Platelet count 170  Eliquis was discontinued on 4.1.19 by Melina Copa.  Saw his PCP on 4.8.19 and found to be in AF again and was advised to restart.  Per her note, patient declined to restart.  He was then reached by [facility] on 4.11.19 and advised to re-start.    Also in her note Lisbeth Renshaw states that he is not a surgical candidate.  Should he be cleared, and should he still be on Eliquis, he can hold for 2 days prior to procedure.

## 2017-11-24 NOTE — Telephone Encounter (Signed)
Request for surgical clearance:  1. What type of surgery is being performed?  Baker's  Cyst Aspiration and Injection  2. When is this surgery scheduled?  TBD   3. Are there any medications that need to be held prior to surgery and how long? Eliquis 2 days prior to procedure   4. Name of physician performing surgery?  [facility]   5. What is your office phone and fax number? Phone [phone] [phone] 6. Anesthesia- not listed

## 2017-11-25 ENCOUNTER — Other Ambulatory Visit: Payer: Self-pay | Admitting: Internal Medicine

## 2017-11-25 ENCOUNTER — Telehealth: Payer: Self-pay | Admitting: Cardiology

## 2017-11-25 DIAGNOSIS — Z8521 Personal history of malignant neoplasm of larynx: Secondary | ICD-10-CM | POA: Diagnosis not present

## 2017-11-25 DIAGNOSIS — I2510 Atherosclerotic heart disease of native coronary artery without angina pectoris: Secondary | ICD-10-CM | POA: Diagnosis not present

## 2017-11-25 DIAGNOSIS — Z85038 Personal history of other malignant neoplasm of large intestine: Secondary | ICD-10-CM | POA: Diagnosis not present

## 2017-11-25 DIAGNOSIS — R5383 Other fatigue: Secondary | ICD-10-CM | POA: Diagnosis not present

## 2017-11-25 DIAGNOSIS — G3184 Mild cognitive impairment, so stated: Secondary | ICD-10-CM | POA: Diagnosis not present

## 2017-11-25 DIAGNOSIS — I480 Paroxysmal atrial fibrillation: Secondary | ICD-10-CM | POA: Diagnosis not present

## 2017-11-25 DIAGNOSIS — Z9002 Acquired absence of larynx: Secondary | ICD-10-CM | POA: Diagnosis not present

## 2017-11-25 DIAGNOSIS — R531 Weakness: Secondary | ICD-10-CM | POA: Diagnosis not present

## 2017-11-25 DIAGNOSIS — M7122 Synovial cyst of popliteal space [Baker], left knee: Secondary | ICD-10-CM

## 2017-11-25 DIAGNOSIS — I5023 Acute on chronic systolic (congestive) heart failure: Secondary | ICD-10-CM | POA: Diagnosis not present

## 2017-11-25 DIAGNOSIS — I110 Hypertensive heart disease with heart failure: Secondary | ICD-10-CM | POA: Diagnosis not present

## 2017-11-25 NOTE — Telephone Encounter (Signed)
Error/ duplicate See previous phone encounter

## 2017-11-25 NOTE — Telephone Encounter (Signed)
New message    Timothy Suarez from [place] [employer] calling to report HR. Timothy Suarez phone [phone]  STAT if HR is under 50 or over 120 (normal HR is 60-100 beats per minute)  1) What is your heart rate? 54-->56, BP 128/58  2) Do you have a log of your heart rate readings (document readings)? 62-->88 on regular basis   3) Do you have any other symptoms?NO

## 2017-11-25 NOTE — Telephone Encounter (Signed)
Agree with plan 

## 2017-11-25 NOTE — Telephone Encounter (Signed)
I spoke with Langley Gauss from [place] who wanted to make Dr. Radford Pax aware that patient's HR dropped to 56 and pt was breathing heavy. When she asked the patient how does he feel, he states he feels fine and denied all symptoms. I informed her to continue to monitor and patient has f/u appt scheduled with Dr. Radford Pax on 5/21. She verbalized understanding and thankful for the call. Will route to Dr. Radford Pax for further recommendations.

## 2017-11-26 NOTE — Telephone Encounter (Signed)
I reviewed the pt's chart. He is a candidate for baker's cyst aspiration. OK to hold Eliquis x 2 days pre op if needed.  Kerin Ransom PA-C 11/26/2017 1:28 PM

## 2017-11-30 DIAGNOSIS — M7122 Synovial cyst of popliteal space [Baker], left knee: Secondary | ICD-10-CM | POA: Diagnosis not present

## 2017-11-30 DIAGNOSIS — I2510 Atherosclerotic heart disease of native coronary artery without angina pectoris: Secondary | ICD-10-CM | POA: Diagnosis not present

## 2017-11-30 DIAGNOSIS — I5023 Acute on chronic systolic (congestive) heart failure: Secondary | ICD-10-CM | POA: Diagnosis not present

## 2017-11-30 DIAGNOSIS — R531 Weakness: Secondary | ICD-10-CM | POA: Diagnosis not present

## 2017-11-30 DIAGNOSIS — Z9002 Acquired absence of larynx: Secondary | ICD-10-CM | POA: Diagnosis not present

## 2017-11-30 DIAGNOSIS — Z8521 Personal history of malignant neoplasm of larynx: Secondary | ICD-10-CM | POA: Diagnosis not present

## 2017-11-30 DIAGNOSIS — Z85038 Personal history of other malignant neoplasm of large intestine: Secondary | ICD-10-CM | POA: Diagnosis not present

## 2017-11-30 DIAGNOSIS — I110 Hypertensive heart disease with heart failure: Secondary | ICD-10-CM | POA: Diagnosis not present

## 2017-11-30 DIAGNOSIS — G3184 Mild cognitive impairment, so stated: Secondary | ICD-10-CM | POA: Diagnosis not present

## 2017-11-30 DIAGNOSIS — R5383 Other fatigue: Secondary | ICD-10-CM | POA: Diagnosis not present

## 2017-11-30 DIAGNOSIS — I480 Paroxysmal atrial fibrillation: Secondary | ICD-10-CM | POA: Diagnosis not present

## 2017-12-01 ENCOUNTER — Ambulatory Visit
Admission: RE | Admit: 2017-12-01 | Discharge: 2017-12-01 | Disposition: A | Discharge status: Home / Self Care | Payer: Medicare Other | Source: Ambulatory Visit | Attending: Internal Medicine | Admitting: Internal Medicine

## 2017-12-01 DIAGNOSIS — M7122 Synovial cyst of popliteal space [Baker], left knee: Secondary | ICD-10-CM

## 2017-12-02 DIAGNOSIS — I2510 Atherosclerotic heart disease of native coronary artery without angina pectoris: Secondary | ICD-10-CM | POA: Diagnosis not present

## 2017-12-02 DIAGNOSIS — Z9002 Acquired absence of larynx: Secondary | ICD-10-CM | POA: Diagnosis not present

## 2017-12-02 DIAGNOSIS — I480 Paroxysmal atrial fibrillation: Secondary | ICD-10-CM | POA: Diagnosis not present

## 2017-12-02 DIAGNOSIS — Z8521 Personal history of malignant neoplasm of larynx: Secondary | ICD-10-CM | POA: Diagnosis not present

## 2017-12-02 DIAGNOSIS — Z85038 Personal history of other malignant neoplasm of large intestine: Secondary | ICD-10-CM | POA: Diagnosis not present

## 2017-12-02 DIAGNOSIS — M7122 Synovial cyst of popliteal space [Baker], left knee: Secondary | ICD-10-CM | POA: Diagnosis not present

## 2017-12-02 DIAGNOSIS — R531 Weakness: Secondary | ICD-10-CM | POA: Diagnosis not present

## 2017-12-02 DIAGNOSIS — I110 Hypertensive heart disease with heart failure: Secondary | ICD-10-CM | POA: Diagnosis not present

## 2017-12-02 DIAGNOSIS — G3184 Mild cognitive impairment, so stated: Secondary | ICD-10-CM | POA: Diagnosis not present

## 2017-12-02 DIAGNOSIS — R5383 Other fatigue: Secondary | ICD-10-CM | POA: Diagnosis not present

## 2017-12-02 DIAGNOSIS — I5023 Acute on chronic systolic (congestive) heart failure: Secondary | ICD-10-CM | POA: Diagnosis not present

## 2017-12-06 DIAGNOSIS — Z9002 Acquired absence of larynx: Secondary | ICD-10-CM | POA: Diagnosis not present

## 2017-12-06 DIAGNOSIS — G3184 Mild cognitive impairment, so stated: Secondary | ICD-10-CM | POA: Diagnosis not present

## 2017-12-06 DIAGNOSIS — I2510 Atherosclerotic heart disease of native coronary artery without angina pectoris: Secondary | ICD-10-CM | POA: Diagnosis not present

## 2017-12-06 DIAGNOSIS — I5023 Acute on chronic systolic (congestive) heart failure: Secondary | ICD-10-CM | POA: Diagnosis not present

## 2017-12-06 DIAGNOSIS — M7122 Synovial cyst of popliteal space [Baker], left knee: Secondary | ICD-10-CM | POA: Diagnosis not present

## 2017-12-06 DIAGNOSIS — R5383 Other fatigue: Secondary | ICD-10-CM | POA: Diagnosis not present

## 2017-12-06 DIAGNOSIS — Z85038 Personal history of other malignant neoplasm of large intestine: Secondary | ICD-10-CM | POA: Diagnosis not present

## 2017-12-06 DIAGNOSIS — I480 Paroxysmal atrial fibrillation: Secondary | ICD-10-CM | POA: Diagnosis not present

## 2017-12-06 DIAGNOSIS — I110 Hypertensive heart disease with heart failure: Secondary | ICD-10-CM | POA: Diagnosis not present

## 2017-12-06 DIAGNOSIS — R531 Weakness: Secondary | ICD-10-CM | POA: Diagnosis not present

## 2017-12-06 DIAGNOSIS — Z8521 Personal history of malignant neoplasm of larynx: Secondary | ICD-10-CM | POA: Diagnosis not present

## 2017-12-07 ENCOUNTER — Telehealth: Payer: Self-pay | Admitting: *Deleted

## 2017-12-07 DIAGNOSIS — Z85038 Personal history of other malignant neoplasm of large intestine: Secondary | ICD-10-CM | POA: Diagnosis not present

## 2017-12-07 DIAGNOSIS — G3184 Mild cognitive impairment, so stated: Secondary | ICD-10-CM | POA: Diagnosis not present

## 2017-12-07 DIAGNOSIS — R5383 Other fatigue: Secondary | ICD-10-CM | POA: Diagnosis not present

## 2017-12-07 DIAGNOSIS — I5023 Acute on chronic systolic (congestive) heart failure: Secondary | ICD-10-CM | POA: Diagnosis not present

## 2017-12-07 DIAGNOSIS — Z8521 Personal history of malignant neoplasm of larynx: Secondary | ICD-10-CM | POA: Diagnosis not present

## 2017-12-07 DIAGNOSIS — R531 Weakness: Secondary | ICD-10-CM | POA: Diagnosis not present

## 2017-12-07 DIAGNOSIS — M7122 Synovial cyst of popliteal space [Baker], left knee: Secondary | ICD-10-CM | POA: Diagnosis not present

## 2017-12-07 DIAGNOSIS — I110 Hypertensive heart disease with heart failure: Secondary | ICD-10-CM | POA: Diagnosis not present

## 2017-12-07 DIAGNOSIS — I2510 Atherosclerotic heart disease of native coronary artery without angina pectoris: Secondary | ICD-10-CM | POA: Diagnosis not present

## 2017-12-07 DIAGNOSIS — I480 Paroxysmal atrial fibrillation: Secondary | ICD-10-CM | POA: Diagnosis not present

## 2017-12-07 DIAGNOSIS — Z9002 Acquired absence of larynx: Secondary | ICD-10-CM | POA: Diagnosis not present

## 2017-12-07 NOTE — Telephone Encounter (Signed)
Daughter, Miguel Dibble called and stated that Levada Dy with [facility] called and stated that patient's PT/Nursing Laquilla Dault have to be canceled due to Provider not signing the Plan of Care.   I called Levada Dy with [facility] [phone] and she stated that the order had been faxed and not returned.  I checked with Roland Earl and the fax was received this morning 12/07/17 at 1:14am. I informed Levada Dy this and she stated she wasn't sure what her office was expecting and she assured me patient would have his therapy. I told her the Plan of Care would be placed in Dr. Cyndi Lennert folder and signed Thursday when she returned to the office. Agreed  I called daughter and informed her, had to leave message on VM.

## 2017-12-08 ENCOUNTER — Encounter: Payer: Self-pay | Admitting: Internal Medicine

## 2017-12-10 DIAGNOSIS — I5023 Acute on chronic systolic (congestive) heart failure: Secondary | ICD-10-CM | POA: Diagnosis not present

## 2017-12-10 DIAGNOSIS — I110 Hypertensive heart disease with heart failure: Secondary | ICD-10-CM | POA: Diagnosis not present

## 2017-12-10 DIAGNOSIS — I480 Paroxysmal atrial fibrillation: Secondary | ICD-10-CM | POA: Diagnosis not present

## 2017-12-10 DIAGNOSIS — I2510 Atherosclerotic heart disease of native coronary artery without angina pectoris: Secondary | ICD-10-CM | POA: Diagnosis not present

## 2017-12-10 DIAGNOSIS — Z9002 Acquired absence of larynx: Secondary | ICD-10-CM | POA: Diagnosis not present

## 2017-12-10 DIAGNOSIS — M7122 Synovial cyst of popliteal space [Baker], left knee: Secondary | ICD-10-CM | POA: Diagnosis not present

## 2017-12-10 DIAGNOSIS — R531 Weakness: Secondary | ICD-10-CM | POA: Diagnosis not present

## 2017-12-10 DIAGNOSIS — Z8521 Personal history of malignant neoplasm of larynx: Secondary | ICD-10-CM | POA: Diagnosis not present

## 2017-12-10 DIAGNOSIS — R5383 Other fatigue: Secondary | ICD-10-CM | POA: Diagnosis not present

## 2017-12-10 DIAGNOSIS — Z85038 Personal history of other malignant neoplasm of large intestine: Secondary | ICD-10-CM | POA: Diagnosis not present

## 2017-12-10 DIAGNOSIS — G3184 Mild cognitive impairment, so stated: Secondary | ICD-10-CM | POA: Diagnosis not present

## 2017-12-13 DIAGNOSIS — M7122 Synovial cyst of popliteal space [Baker], left knee: Secondary | ICD-10-CM | POA: Diagnosis not present

## 2017-12-13 DIAGNOSIS — I480 Paroxysmal atrial fibrillation: Secondary | ICD-10-CM | POA: Diagnosis not present

## 2017-12-13 DIAGNOSIS — I2510 Atherosclerotic heart disease of native coronary artery without angina pectoris: Secondary | ICD-10-CM | POA: Diagnosis not present

## 2017-12-13 DIAGNOSIS — Z8521 Personal history of malignant neoplasm of larynx: Secondary | ICD-10-CM | POA: Diagnosis not present

## 2017-12-13 DIAGNOSIS — R5383 Other fatigue: Secondary | ICD-10-CM | POA: Diagnosis not present

## 2017-12-13 DIAGNOSIS — G3184 Mild cognitive impairment, so stated: Secondary | ICD-10-CM | POA: Diagnosis not present

## 2017-12-13 DIAGNOSIS — I110 Hypertensive heart disease with heart failure: Secondary | ICD-10-CM | POA: Diagnosis not present

## 2017-12-13 DIAGNOSIS — R531 Weakness: Secondary | ICD-10-CM | POA: Diagnosis not present

## 2017-12-13 DIAGNOSIS — I5023 Acute on chronic systolic (congestive) heart failure: Secondary | ICD-10-CM | POA: Diagnosis not present

## 2017-12-13 DIAGNOSIS — Z9002 Acquired absence of larynx: Secondary | ICD-10-CM | POA: Diagnosis not present

## 2017-12-13 DIAGNOSIS — Z85038 Personal history of other malignant neoplasm of large intestine: Secondary | ICD-10-CM | POA: Diagnosis not present

## 2017-12-14 DIAGNOSIS — I480 Paroxysmal atrial fibrillation: Secondary | ICD-10-CM | POA: Diagnosis not present

## 2017-12-14 DIAGNOSIS — R531 Weakness: Secondary | ICD-10-CM | POA: Diagnosis not present

## 2017-12-14 DIAGNOSIS — M7122 Synovial cyst of popliteal space [Baker], left knee: Secondary | ICD-10-CM | POA: Diagnosis not present

## 2017-12-14 DIAGNOSIS — I5023 Acute on chronic systolic (congestive) heart failure: Secondary | ICD-10-CM | POA: Diagnosis not present

## 2017-12-14 DIAGNOSIS — G3184 Mild cognitive impairment, so stated: Secondary | ICD-10-CM | POA: Diagnosis not present

## 2017-12-14 DIAGNOSIS — Z9002 Acquired absence of larynx: Secondary | ICD-10-CM | POA: Diagnosis not present

## 2017-12-14 DIAGNOSIS — R5383 Other fatigue: Secondary | ICD-10-CM | POA: Diagnosis not present

## 2017-12-14 DIAGNOSIS — I2510 Atherosclerotic heart disease of native coronary artery without angina pectoris: Secondary | ICD-10-CM | POA: Diagnosis not present

## 2017-12-14 DIAGNOSIS — Z8521 Personal history of malignant neoplasm of larynx: Secondary | ICD-10-CM | POA: Diagnosis not present

## 2017-12-14 DIAGNOSIS — I110 Hypertensive heart disease with heart failure: Secondary | ICD-10-CM | POA: Diagnosis not present

## 2017-12-14 DIAGNOSIS — Z85038 Personal history of other malignant neoplasm of large intestine: Secondary | ICD-10-CM | POA: Diagnosis not present

## 2017-12-16 DIAGNOSIS — I110 Hypertensive heart disease with heart failure: Secondary | ICD-10-CM | POA: Diagnosis not present

## 2017-12-16 DIAGNOSIS — R5383 Other fatigue: Secondary | ICD-10-CM | POA: Diagnosis not present

## 2017-12-16 DIAGNOSIS — G3184 Mild cognitive impairment, so stated: Secondary | ICD-10-CM | POA: Diagnosis not present

## 2017-12-16 DIAGNOSIS — Z85038 Personal history of other malignant neoplasm of large intestine: Secondary | ICD-10-CM | POA: Diagnosis not present

## 2017-12-16 DIAGNOSIS — I480 Paroxysmal atrial fibrillation: Secondary | ICD-10-CM | POA: Diagnosis not present

## 2017-12-16 DIAGNOSIS — I2510 Atherosclerotic heart disease of native coronary artery without angina pectoris: Secondary | ICD-10-CM | POA: Diagnosis not present

## 2017-12-16 DIAGNOSIS — R531 Weakness: Secondary | ICD-10-CM | POA: Diagnosis not present

## 2017-12-16 DIAGNOSIS — Z9002 Acquired absence of larynx: Secondary | ICD-10-CM | POA: Diagnosis not present

## 2017-12-16 DIAGNOSIS — M7122 Synovial cyst of popliteal space [Baker], left knee: Secondary | ICD-10-CM | POA: Diagnosis not present

## 2017-12-16 DIAGNOSIS — Z8521 Personal history of malignant neoplasm of larynx: Secondary | ICD-10-CM | POA: Diagnosis not present

## 2017-12-16 DIAGNOSIS — I5023 Acute on chronic systolic (congestive) heart failure: Secondary | ICD-10-CM | POA: Diagnosis not present

## 2017-12-21 DIAGNOSIS — Z85038 Personal history of other malignant neoplasm of large intestine: Secondary | ICD-10-CM | POA: Diagnosis not present

## 2017-12-21 DIAGNOSIS — R5383 Other fatigue: Secondary | ICD-10-CM | POA: Diagnosis not present

## 2017-12-21 DIAGNOSIS — I2510 Atherosclerotic heart disease of native coronary artery without angina pectoris: Secondary | ICD-10-CM | POA: Diagnosis not present

## 2017-12-21 DIAGNOSIS — M7122 Synovial cyst of popliteal space [Baker], left knee: Secondary | ICD-10-CM | POA: Diagnosis not present

## 2017-12-21 DIAGNOSIS — I5023 Acute on chronic systolic (congestive) heart failure: Secondary | ICD-10-CM | POA: Diagnosis not present

## 2017-12-21 DIAGNOSIS — I480 Paroxysmal atrial fibrillation: Secondary | ICD-10-CM | POA: Diagnosis not present

## 2017-12-21 DIAGNOSIS — Z8521 Personal history of malignant neoplasm of larynx: Secondary | ICD-10-CM | POA: Diagnosis not present

## 2017-12-21 DIAGNOSIS — I110 Hypertensive heart disease with heart failure: Secondary | ICD-10-CM | POA: Diagnosis not present

## 2017-12-21 DIAGNOSIS — G3184 Mild cognitive impairment, so stated: Secondary | ICD-10-CM | POA: Diagnosis not present

## 2017-12-21 DIAGNOSIS — R531 Weakness: Secondary | ICD-10-CM | POA: Diagnosis not present

## 2017-12-21 DIAGNOSIS — Z9002 Acquired absence of larynx: Secondary | ICD-10-CM | POA: Diagnosis not present

## 2017-12-22 DIAGNOSIS — I2510 Atherosclerotic heart disease of native coronary artery without angina pectoris: Secondary | ICD-10-CM | POA: Diagnosis not present

## 2017-12-22 DIAGNOSIS — G3184 Mild cognitive impairment, so stated: Secondary | ICD-10-CM | POA: Diagnosis not present

## 2017-12-22 DIAGNOSIS — I110 Hypertensive heart disease with heart failure: Secondary | ICD-10-CM | POA: Diagnosis not present

## 2017-12-22 DIAGNOSIS — Z85038 Personal history of other malignant neoplasm of large intestine: Secondary | ICD-10-CM | POA: Diagnosis not present

## 2017-12-22 DIAGNOSIS — I480 Paroxysmal atrial fibrillation: Secondary | ICD-10-CM | POA: Diagnosis not present

## 2017-12-22 DIAGNOSIS — Z9002 Acquired absence of larynx: Secondary | ICD-10-CM | POA: Diagnosis not present

## 2017-12-22 DIAGNOSIS — M7122 Synovial cyst of popliteal space [Baker], left knee: Secondary | ICD-10-CM | POA: Diagnosis not present

## 2017-12-22 DIAGNOSIS — Z8521 Personal history of malignant neoplasm of larynx: Secondary | ICD-10-CM | POA: Diagnosis not present

## 2017-12-22 DIAGNOSIS — I5023 Acute on chronic systolic (congestive) heart failure: Secondary | ICD-10-CM | POA: Diagnosis not present

## 2017-12-22 DIAGNOSIS — R5383 Other fatigue: Secondary | ICD-10-CM | POA: Diagnosis not present

## 2017-12-22 DIAGNOSIS — R531 Weakness: Secondary | ICD-10-CM | POA: Diagnosis not present

## 2017-12-25 ENCOUNTER — Other Ambulatory Visit: Payer: Self-pay | Admitting: Internal Medicine

## 2017-12-28 ENCOUNTER — Encounter: Payer: Self-pay | Admitting: Cardiology

## 2017-12-28 ENCOUNTER — Ambulatory Visit: Payer: Medicare Other | Admitting: Cardiology

## 2017-12-28 VITALS — BP 110/54 | HR 47 | Ht 66.0 in | Wt 177.0 lb

## 2017-12-28 DIAGNOSIS — I341 Nonrheumatic mitral (valve) prolapse: Secondary | ICD-10-CM

## 2017-12-28 DIAGNOSIS — I2510 Atherosclerotic heart disease of native coronary artery without angina pectoris: Secondary | ICD-10-CM | POA: Diagnosis not present

## 2017-12-28 DIAGNOSIS — I5022 Chronic systolic (congestive) heart failure: Secondary | ICD-10-CM | POA: Diagnosis not present

## 2017-12-28 DIAGNOSIS — I480 Paroxysmal atrial fibrillation: Secondary | ICD-10-CM | POA: Diagnosis not present

## 2017-12-28 DIAGNOSIS — I420 Dilated cardiomyopathy: Secondary | ICD-10-CM | POA: Diagnosis not present

## 2017-12-28 DIAGNOSIS — I351 Nonrheumatic aortic (valve) insufficiency: Secondary | ICD-10-CM | POA: Diagnosis not present

## 2017-12-28 DIAGNOSIS — I10 Essential (primary) hypertension: Secondary | ICD-10-CM | POA: Diagnosis not present

## 2017-12-28 NOTE — Progress Notes (Signed)
Cardiology Office Note:    Date:  12/28/2017   ID:  Timothy Suarez, DOB 1925/01/27, MRN [id number]  PCP:  Gayland Curry, DO  Cardiologist:  Fransico Him, MD    Referring MD: Gayland Curry, DO   Chief Complaint  Patient presents with  . Mitral Regurgitation  . Hypertension  . Coronary Artery Disease  . Congestive Heart Failure  . Cardiomyopathy    History of Present Illness:    Timothy Suarez is a 84 y.o. male with a hx of PAF, MVP with severe MR s/p repair, HTN, nonobstructive CAD by cath with 50-70% LAD at time of MV repair, colon cancer, chronic systolic HF with DCM (EF 16-01%) and second degree AVB type 1 and 2.  He has been on medical management and no further invasive workup recommended given his advanced age, laryngeal CA and other comorbidities.  He is here today for followup and is doing well.  He denies any chest pain or pressure, SOB, DOE, PND, orthopnea, LE edema, dizziness, palpitations or syncope. He is compliant with his meds and is tolerating meds with no SE.    Past Medical History:  Diagnosis Date  . Arthritis   . Cerebral ischemia   . CKD (chronic kidney disease), stage III ([place])   . Colon cancer [facility])     s/p partial colectomy  . Complication of anesthesia    "woke up w/confusion and hallucinations once after mitral valve OR"  . Coronary artery disease    a. pre-surgical cath 2010 showed 50-60% of the LAD with negative nuclear stress test at that time. b. normal nuc 2016.  . First degree AV block   . Glaucoma   . Hernia   . HTN (hypertension)    takes Amlodipine daily  . Hyperlipidemia   . Major depression   . Mild cognitive impairment   . Mitral regurgitation    a. mild-mod MR by echo 08/2017.  . Mobitz type 1 second degree atrioventricular block   . Mobitz type 2 second degree atrioventricular block   . Moderate aortic insufficiency   . MVP (mitral valve prolapse)    S/P Rt mini thoractomy for Mitral Valve repair  . Paroxysmal atrial  fibrillation (HCC)    S/P Maze procedure  . 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 Premature atrial contractions   . Prostate cancer ([place])   . PVC's (premature ventricular contractions)   . RBBB   . 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 Sleep disturbance   . Thoracic aortic aneurysm ([place])    a. last echo 08/2017: mildly dilated aortic root.  . Thrombocytopenia ([place])   . Wandering atrial pacemaker     Past Surgical History:  Procedure Laterality Date  . CATARACT EXTRACTION W/ INTRAOCULAR LENS  IMPLANT, BILATERAL Bilateral   . COLECTOMY  2004   Dr [name]  . DIRECT LARYNGOSCOPY N/A 01/15/2016   Procedure: DIRECT LARYNGOSCOPY WITH BIOPSY AND FROZEN SECTION;  Surgeon: Izora Gala, MD;  Location: [place];  Service: ENT;  Laterality: N/A;  . FlexHD patch repair of chest wall hernia.  01/28/2011   [name]  . GASTROSTOMY W/ FEEDING TUBE    . IR GENERIC HISTORICAL  10/08/2016   IR GASTROSTOMY TUBE REMOVAL 10/08/2016 Sandi Mariscal, MD [facility]-INTERV RAD  . JOINT REPLACEMENT    . LARYNGETOMY N/A 01/15/2016   Procedure:  TOTAL LARYNGECTOMY;  Surgeon: Izora Gala, MD;  Location: [facility]  OR;  Service: ENT;  Laterality: N/A;  . MAZE  12/27/2008   left side lesion set  . MICROLARYNGOSCOPY Right 01/29/2015   Procedure: MICROLARYNGOSCOPY WITH BIOSPY OF RIGHT VOCAL CORD;  Surgeon: Izora Gala, MD;  Location: [place];  Service: ENT;  Laterality: Right;  . MITRAL VALVE REPAIR  12/27/2008   complex valvuloplasty with 1mm Memo 3D annuloplasty ring via right minithoracotomy  . PECTORALIS FLAP Left 05/04/2016   Procedure: PECTORALIS FLAP to neck with possible, split thickness skin graft;  Surgeon: Loel Lofty Dillingham, DO;  Location: [place];  Service: Plastics;  Laterality: Left;  . PROSTATE BIOPSY    . SKIN SPLIT GRAFT Left 05/04/2016   Procedure: PECTORALIS MAJOR MYOCUTANEOUS FLAP RECONSTRUCTION OF PHARYNX AND SPLIT THICKNESS SKIN GRAFT;  Surgeon: Izora Gala,  MD;  Location: [place];  Service: ENT;  Laterality: Left;  . TEE WITHOUT CARDIOVERSION N/A 01/03/2013   Procedure: TRANSESOPHAGEAL ECHOCARDIOGRAM (TEE);  Surgeon: Sueanne Margarita, MD;  Location: [place];  Service: Cardiovascular;  Laterality: N/A;  . TOTAL KNEE ARTHROPLASTY Right 2003  . TRACHEAL ESOPHAGEAL PUNCTURE REPAIR N/A 03/26/2016   Procedure: TRACHEAL ESOPHAGEAL PUNCTURE;  Surgeon: Izora Gala, MD;  Location: [place];  Service: ENT;  Laterality: N/A;  . TRACHEOESOPHAGEAL FISTULA REPAIR N/A 03/26/2016   Procedure: TRACHEO-ESOPHAGEAL   PUNCTURE,FISTULAR CLOSURE;  Surgeon: Izora Gala, MD;  Location: [place];  Service: ENT;  Laterality: N/A;  . TRACHEOESOPHAGEAL FISTULA REPAIR N/A 04/09/2016   Procedure: FISTULA REPAIR WITH  MUSCLE ROTATION FLAP;  Surgeon: Izora Gala, MD;  Location: [place];  Service: ENT;  Laterality: N/A;  . TRACHEOESOPHAGEAL FISTULA REPAIR N/A 07/27/2016   Procedure: CLOSURE OF FISTULA;  Surgeon: Izora Gala, MD;  Location: [place];  Service: ENT;  Laterality: N/A;  . TYMPANOPLASTY  1967   "? side"    Current Medications: Current Meds  Medication Sig  . apixaban (ELIQUIS) 5 MG TABS tablet Take 1 tablet (5 mg total) by mouth 2 (two) times daily.  Marland Kitchen atorvastatin (LIPITOR) 40 MG tablet TAKE ONE TABLET BY MOUTH DAILY  . furosemide (LASIX) 40 MG tablet Take 1 tablet (40 mg total) by mouth every morning AND 0.5 tablets (20 mg total) every evening.  Marland Kitchen lisinopril (PRINIVIL,ZESTRIL) 2.5 MG tablet Take 1 tablet (2.5 mg total) by mouth daily.     Allergies:   Ambien [zolpidem]; Metoprolol; Citalopram; Trazodone and nefazodone; and Zoloft [sertraline hcl]   Social History   Socioeconomic History  . Marital status: Widowed    Spouse name: Not on file  . Number of children: 2  . Years of education: 14  . Highest education level: Not on file  Occupational History  . Occupation: retired Dispensing optician  . Financial resource strain: Not on file  . Food insecurity:     Worry: Not on file    Inability: Not on file  . Transportation needs:    Medical: Not on file    Non-medical: Not on file  Tobacco Use  . Smoking status: Never Smoker  . Smokeless tobacco: Never Used  Substance and Sexual Activity  . Alcohol use: Yes    Alcohol/week: 0.0 oz    Comment: rare  . Drug use: No  . Sexual activity: Never  Lifestyle  . Physical activity:    Days per week: Not on file    Minutes per session: Not on file  . Stress: Not on file  Relationships  . Social connections:    Talks on phone: Not  on file    Gets together: Not on file    Attends religious service: Not on file    Active member of club or organization: Not on file    Attends meetings of clubs or organizations: Not on file    Relationship status: Not on file  Other Topics Concern  . 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 There is no history of Heart attack or Stroke.  ROS:   Please see the history of present illness.    ROS  All other systems reviewed and negative.   EKGs/Labs/Other Studies Reviewed:    The following studies were reviewed today: none  EKG:  EKG is  ordered today NSR with RBBB and PVCs  Recent Labs: 08/27/2017: TSH 5.26 11/04/2017: ALT 6; Brain Natriuretic Peptide 2,151; BUN 20; Creat 1.15; Hemoglobin 14.8; Platelets 170; Potassium 4.8; Sodium 142   Recent Lipid Panel    Component Value Date/Time   CHOL 141 12/21/2016 0949   CHOL 144 08/26/2015 1042   TRIG 62 12/21/2016 0949   HDL 52 12/21/2016 0949   HDL 49 08/26/2015 1042   CHOLHDL 2.7 12/21/2016 0949   VLDL 12 12/21/2016 0949   LDLCALC 77 12/21/2016 0949   LDLCALC 68 08/26/2015 1042    Physical Exam:    VS:  BP (!) 110/54   Pulse (!) 47   Ht 5\' 6"  (1.676 m)    Wt 177 lb (80.3 kg)   BMI 28.57 kg/m     Wt Readings from Last 3 Encounters:  12/28/17 177 lb (80.3 kg)  11/15/17 184 lb (83.5 kg)  11/08/17 184 lb (83.5 kg)     GEN:  Well nourished, well developed in no acute distress HEENT: Normal NECK: No JVD; No carotid bruits LYMPHATICS: No lymphadenopathy CARDIAC: RRR, no murmurs, rubs, gallops RESPIRATORY:  Clear to auscultation without rales, wheezing or rhonchi  ABDOMEN: Soft, non-tender, non-distended MUSCULOSKELETAL:  No edema; No deformity  SKIN: Warm and dry NEUROLOGIC:  Alert and oriented x 3 PSYCHIATRIC:  Normal affect   ASSESSMENT:    1. MVP (mitral valve prolapse)   2. Paroxysmal atrial fibrillation (HCC)   3. Essential hypertension   4. Moderate aortic insufficiency   5. Coronary artery disease involving native coronary artery of native heart without angina pectoris   6. Chronic systolic congestive heart failure ([place])   7. Dilated cardiomyopathy ([place])    PLAN:    In order of problems listed above:  1.  MVP s/p MV repair - 2D echo 08/13/2017 showed mild to moderate mitral regurgitation.  He is a symptomatic.  2.  PAF - status post Maze procedure.  He is maintaining normal sinus rhythm on exam.  He will continue on apixaban 5 mg twice daily.  CHADS2VASC score is 5. Creatinine is stable 1.15 on 11/04/2017.  3.  HTN - BP is well controlled on exam today.  He will continue lisinopril 2.5 mg daily.  4.  Aortic insuff - moderate AR by echo 08/2017.  5.  ASCAD - cath at time of mitral valve repair showed nonobstructive CAD with a 2 to 70% mid LAD stenosis.  He  has not had any anginal symptoms although he is chronically fatigued.  He does not want any type of invasive procedures.Marland Kitchen  He is not on aspirin due to DOAC.  He will continue on statin therapy.  6.  Chronic systolic CHF - he appears euvolemic on exam today and weight is stable.  He will continue on Lasix 40 mg every morning and 20 mg every afternoon.  His creatinine was  stable at 1.15 on 11/04/2017.  We will continue on low-dose ACE inhibitor.  BP is too soft to titrate medicines further.  7.  DCM -2D echocardiogram 08/13/2017 showed EF 20 to 25% with diffuse hypokinesis.   Medication Adjustments/Labs and Tests Ordered: Current medicines are reviewed at length with the patient today.  Concerns regarding medicines are outlined above.  No orders of the defined types were placed in this encounter.  No orders of the defined types were placed in this encounter.   Signed, Fransico Him, MD  12/28/2017 11:23 AM    [place]

## 2017-12-28 NOTE — Patient Instructions (Signed)
Medication Instructions:  Your physician recommends that you continue on your current medications as directed. Please refer to the Current Medication list given to you today.  If you need a refill on your cardiac medications, please contact your pharmacy first.  Labwork: None ordered   Testing/Procedures: None ordered   Follow-Up: Your physician wants you to follow-up in: 6 months with PA  You will receive a reminder letter in the mail two months in advance. If you don't receive a letter, please call our office to schedule the follow-up appointment.  Your physician wants you to follow-up in: 1 year with Dr. Turner. You will receive a reminder letter in the mail two months in advance. If you don't receive a letter, please call our office to schedule the follow-up appointment.  Any Other Special Instructions Will Be Listed Below (If Applicable).   Thank you for choosing [facility]    Rena Alexes Lamarque, RN  [phone]  If you need a refill on your cardiac medications before your next appointment, please call your pharmacy.   

## 2017-12-29 DIAGNOSIS — I5023 Acute on chronic systolic (congestive) heart failure: Secondary | ICD-10-CM | POA: Diagnosis not present

## 2017-12-29 DIAGNOSIS — Z9002 Acquired absence of larynx: Secondary | ICD-10-CM | POA: Diagnosis not present

## 2017-12-29 DIAGNOSIS — I480 Paroxysmal atrial fibrillation: Secondary | ICD-10-CM | POA: Diagnosis not present

## 2017-12-29 DIAGNOSIS — I110 Hypertensive heart disease with heart failure: Secondary | ICD-10-CM | POA: Diagnosis not present

## 2017-12-29 DIAGNOSIS — I2510 Atherosclerotic heart disease of native coronary artery without angina pectoris: Secondary | ICD-10-CM | POA: Diagnosis not present

## 2017-12-29 DIAGNOSIS — R5383 Other fatigue: Secondary | ICD-10-CM | POA: Diagnosis not present

## 2017-12-29 DIAGNOSIS — G3184 Mild cognitive impairment, so stated: Secondary | ICD-10-CM | POA: Diagnosis not present

## 2017-12-29 DIAGNOSIS — Z8521 Personal history of malignant neoplasm of larynx: Secondary | ICD-10-CM | POA: Diagnosis not present

## 2017-12-29 DIAGNOSIS — M7122 Synovial cyst of popliteal space [Baker], left knee: Secondary | ICD-10-CM | POA: Diagnosis not present

## 2017-12-29 DIAGNOSIS — R531 Weakness: Secondary | ICD-10-CM | POA: Diagnosis not present

## 2017-12-29 DIAGNOSIS — Z85038 Personal history of other malignant neoplasm of large intestine: Secondary | ICD-10-CM | POA: Diagnosis not present

## 2017-12-30 ENCOUNTER — Telehealth: Payer: Self-pay | Admitting: Internal Medicine

## 2017-12-30 NOTE — Telephone Encounter (Signed)
Left msg w/ pt's dtr, Hilda Blades, asking her to confirm this AWV-S w/ nurse first. Last AWV 03/01/17. VDM (DD)

## 2017-12-31 DIAGNOSIS — Z85038 Personal history of other malignant neoplasm of large intestine: Secondary | ICD-10-CM | POA: Diagnosis not present

## 2017-12-31 DIAGNOSIS — Z8521 Personal history of malignant neoplasm of larynx: Secondary | ICD-10-CM | POA: Diagnosis not present

## 2017-12-31 DIAGNOSIS — I2510 Atherosclerotic heart disease of native coronary artery without angina pectoris: Secondary | ICD-10-CM | POA: Diagnosis not present

## 2017-12-31 DIAGNOSIS — M7122 Synovial cyst of popliteal space [Baker], left knee: Secondary | ICD-10-CM | POA: Diagnosis not present

## 2017-12-31 DIAGNOSIS — R5383 Other fatigue: Secondary | ICD-10-CM | POA: Diagnosis not present

## 2017-12-31 DIAGNOSIS — R531 Weakness: Secondary | ICD-10-CM | POA: Diagnosis not present

## 2017-12-31 DIAGNOSIS — I5023 Acute on chronic systolic (congestive) heart failure: Secondary | ICD-10-CM | POA: Diagnosis not present

## 2017-12-31 DIAGNOSIS — G3184 Mild cognitive impairment, so stated: Secondary | ICD-10-CM | POA: Diagnosis not present

## 2017-12-31 DIAGNOSIS — I110 Hypertensive heart disease with heart failure: Secondary | ICD-10-CM | POA: Diagnosis not present

## 2017-12-31 DIAGNOSIS — I480 Paroxysmal atrial fibrillation: Secondary | ICD-10-CM | POA: Diagnosis not present

## 2017-12-31 DIAGNOSIS — Z9002 Acquired absence of larynx: Secondary | ICD-10-CM | POA: Diagnosis not present

## 2018-01-05 DIAGNOSIS — R531 Weakness: Secondary | ICD-10-CM | POA: Diagnosis not present

## 2018-01-05 DIAGNOSIS — I480 Paroxysmal atrial fibrillation: Secondary | ICD-10-CM | POA: Diagnosis not present

## 2018-01-05 DIAGNOSIS — M7122 Synovial cyst of popliteal space [Baker], left knee: Secondary | ICD-10-CM | POA: Diagnosis not present

## 2018-01-05 DIAGNOSIS — R5383 Other fatigue: Secondary | ICD-10-CM | POA: Diagnosis not present

## 2018-01-05 DIAGNOSIS — G3184 Mild cognitive impairment, so stated: Secondary | ICD-10-CM | POA: Diagnosis not present

## 2018-01-05 DIAGNOSIS — I110 Hypertensive heart disease with heart failure: Secondary | ICD-10-CM | POA: Diagnosis not present

## 2018-01-05 DIAGNOSIS — Z85038 Personal history of other malignant neoplasm of large intestine: Secondary | ICD-10-CM | POA: Diagnosis not present

## 2018-01-05 DIAGNOSIS — Z9002 Acquired absence of larynx: Secondary | ICD-10-CM | POA: Diagnosis not present

## 2018-01-05 DIAGNOSIS — I5023 Acute on chronic systolic (congestive) heart failure: Secondary | ICD-10-CM | POA: Diagnosis not present

## 2018-01-05 DIAGNOSIS — I2510 Atherosclerotic heart disease of native coronary artery without angina pectoris: Secondary | ICD-10-CM | POA: Diagnosis not present

## 2018-01-05 DIAGNOSIS — Z8521 Personal history of malignant neoplasm of larynx: Secondary | ICD-10-CM | POA: Diagnosis not present

## 2018-01-08 ENCOUNTER — Other Ambulatory Visit: Payer: Self-pay | Admitting: Internal Medicine

## 2018-01-08 DIAGNOSIS — G4700 Insomnia, unspecified: Secondary | ICD-10-CM

## 2018-01-14 ENCOUNTER — Other Ambulatory Visit: Payer: Self-pay | Admitting: Cardiology

## 2018-01-14 NOTE — Telephone Encounter (Signed)
Eliquis 5mg  refill request received; pt is 84 yrs old, wt-80.3kg, Crea-1.15 on 11/04/17, last seen by Dr. Radford Pax on 12/28/17; will send in refill to requested pharmacy.

## 2018-01-20 ENCOUNTER — Other Ambulatory Visit: Payer: Self-pay | Admitting: Internal Medicine

## 2018-01-20 DIAGNOSIS — G4700 Insomnia, unspecified: Secondary | ICD-10-CM

## 2018-01-25 ENCOUNTER — Telehealth: Payer: Self-pay | Admitting: *Deleted

## 2018-01-25 ENCOUNTER — Other Ambulatory Visit: Payer: Self-pay | Admitting: Internal Medicine

## 2018-01-25 DIAGNOSIS — G4700 Insomnia, unspecified: Secondary | ICD-10-CM

## 2018-01-25 MED ORDER — ZOLPIDEM TARTRATE 5 MG PO TABS: 5.0000 mg | ORAL_TABLET | Freq: Every evening | ORAL | 0 refills | Status: DC | PRN

## 2018-01-25 NOTE — Telephone Encounter (Signed)
I agree with Dee's comment below about finding out the cash price.  I believe [employer] is one of the insurances that does give a 90 day supply only for a year.  The alternative medications will have similar restrictions for him.  If the cost is prohibitive, trazodone 25mg  or remeron 7.5mg  could be tried at bedtime.

## 2018-01-25 NOTE — Telephone Encounter (Signed)
Ok, I researched that.  It looks like when he was hospitalized at the end of last year, they stopped it.  We haven't sent a Rx in since. I guess he has not been using much since it's now June.  I'll send in Nitro 5mg  po qhs prn insomnia.

## 2018-01-25 NOTE — Telephone Encounter (Signed)
No, the Rx was Denied being filled by our office and is no longer in his current medication list. Medication was Refused being filled on 01/20/2018 by our office and taken out of medication list. Please Advise.

## 2018-01-25 NOTE — Telephone Encounter (Signed)
Timothy Suarez, daughter called and stated that patient's Ambien was denied. She stated that she was not surprised. Stated that patient needs something to help sleep, stated he was a walking zombie without something. Stated that he has tried the OTC things and Melatonin with no relief. Please Advise.

## 2018-01-25 NOTE — Telephone Encounter (Signed)
Patient daughter notified and agreed.  

## 2018-01-25 NOTE — Telephone Encounter (Signed)
With my experience, most patient's are only allowed 90 tablets per year with some insurances then they have to pay out of pocket. She might want to check the cash price.

## 2018-02-21 ENCOUNTER — Other Ambulatory Visit: Payer: Self-pay | Admitting: *Deleted

## 2018-02-21 MED ORDER — LISINOPRIL 2.5 MG PO TABS: 2.5000 mg | ORAL_TABLET | Freq: Every day | ORAL | 1 refills | Status: DC

## 2018-02-21 NOTE — Telephone Encounter (Signed)
Timothy Suarez Timothy Suarez 

## 2018-03-02 ENCOUNTER — Other Ambulatory Visit: Payer: Self-pay | Admitting: Internal Medicine

## 2018-03-02 DIAGNOSIS — G4700 Insomnia, unspecified: Secondary | ICD-10-CM

## 2018-03-02 NOTE — Telephone Encounter (Signed)
Patient's Ambien was checked in the database, filled at the same pharmacy. Medication was phoned to [facility].

## 2018-03-28 ENCOUNTER — Ambulatory Visit (INDEPENDENT_AMBULATORY_CARE_PROVIDER_SITE_OTHER): Payer: Medicare Other | Admitting: Internal Medicine

## 2018-03-28 ENCOUNTER — Encounter: Payer: Self-pay | Admitting: Internal Medicine

## 2018-03-28 VITALS — BP 126/78 | HR 83 | Temp 98.0°F | Ht 66.0 in | Wt 177.5 lb

## 2018-03-28 DIAGNOSIS — K5900 Constipation, unspecified: Secondary | ICD-10-CM | POA: Diagnosis not present

## 2018-03-28 NOTE — Progress Notes (Signed)
    This is a Chief Financial Officer office visit  follow up for specific acute issue of constipation x1 week.  Interim medical record and care since last [place] visit was updated with review of diagnostic studies and change in clinical status since last visit were documented.  HPI: He states he has had constipation for approximately a week.  He denies any other GI symptoms such as abdominal pain, melena, or rectal bleeding.  He consulted 3 pharmacists who recommended 3 different products.  Unfortunately he cannot tell me what the recommendations were.  He does not recognize the name MiraLAX.  He states he tried to employ a rectal suppository but could not insert it.  Significant past history includes a wandering atrial pacemaker, thrombocytopenia, thoracic aortic aneurysm, vocal cord cancer, prostate cancer, depression, essential hypertension, colon cancer, CKD stage III, and coronary artery disease.  Gastroenterology has released him from any additional colonoscopic survelliance. Hypothyroidism is not listed as a diagnosis but his TSH was 8.07 on 07/08/2017.  Most recent TSH on 08/27/2017 was 5.26, therapeutic. He is not on opioids.  Review of systems:  Constitutional: No fever, significant weight change  Gastrointestinal: No heartburn, dysphagia, abdominal pain, nausea /vomiting  Physical exam:  Pertinent or positive findings: Tracheostomy present.  He uses a Health visitor to communicate.  Heart sounds are distant and irregular, heard best in the epigastrium.  He has low-grade rhonchi related to breathing through the trach.  There is midline up scar of the abdomen which is well-healed.  Abdomen is soft and nontender without distention or ileus.  Rectum is empty; no impaction present He exhibits a broad-based unsteady gait.  He ambulates with a rolling walker. Clinically he appears anxious  General appearance: Adequately nourished; no acute distress, increased work of breathing is present.     Lymphatic: No lymphadenopathy about the head, neck, axilla. Eyes: No conjunctival inflammation or lid edema is present. There is no scleral icterus. Neck:  No masses, tenderness noted.    Heart:  No gallop, murmur, click, rub .  Skin: Warm & dry w/o tenting. No significant lesions or rash.  See summary under each active problem in the Problem List with associated updated therapeutic plan

## 2018-03-28 NOTE — Patient Instructions (Addendum)
Miralax every day until you have a bowel movement and then every third day as needed for constipation. Encourage oral hydration; drinking to thirst up to 24 oz of fluids daily. If constipation persists despite MiraLAX, your TSH should be rechecked to rule out hypothyroidism.

## 2018-04-02 ENCOUNTER — Other Ambulatory Visit: Payer: Self-pay | Admitting: Internal Medicine

## 2018-04-07 ENCOUNTER — Other Ambulatory Visit: Payer: Self-pay | Admitting: Internal Medicine

## 2018-04-07 DIAGNOSIS — G4700 Insomnia, unspecified: Secondary | ICD-10-CM

## 2018-04-07 NOTE — Telephone Encounter (Signed)
Database checked and verified rx called into pharmacy

## 2018-04-13 ENCOUNTER — Ambulatory Visit: Payer: Medicare Other | Admitting: Internal Medicine

## 2018-05-02 ENCOUNTER — Ambulatory Visit (INDEPENDENT_AMBULATORY_CARE_PROVIDER_SITE_OTHER): Payer: Medicare Other | Admitting: Internal Medicine

## 2018-05-02 ENCOUNTER — Ambulatory Visit (INDEPENDENT_AMBULATORY_CARE_PROVIDER_SITE_OTHER): Payer: Medicare Other

## 2018-05-02 ENCOUNTER — Other Ambulatory Visit: Payer: Self-pay | Admitting: Internal Medicine

## 2018-05-02 ENCOUNTER — Encounter: Payer: Self-pay | Admitting: Internal Medicine

## 2018-05-02 VITALS — BP 140/64 | HR 93 | Temp 97.7°F | Ht 66.0 in | Wt 178.0 lb

## 2018-05-02 VITALS — BP 140/64 | HR 93 | Temp 97.4°F | Ht 66.0 in | Wt 178.0 lb

## 2018-05-02 DIAGNOSIS — R42 Dizziness and giddiness: Secondary | ICD-10-CM

## 2018-05-02 DIAGNOSIS — K5900 Constipation, unspecified: Secondary | ICD-10-CM

## 2018-05-02 DIAGNOSIS — Z9002 Acquired absence of larynx: Secondary | ICD-10-CM

## 2018-05-02 DIAGNOSIS — Z0000 Encounter for general adult medical examination without abnormal findings: Secondary | ICD-10-CM | POA: Diagnosis not present

## 2018-05-02 DIAGNOSIS — I480 Paroxysmal atrial fibrillation: Secondary | ICD-10-CM | POA: Diagnosis not present

## 2018-05-02 DIAGNOSIS — G4700 Insomnia, unspecified: Secondary | ICD-10-CM

## 2018-05-02 DIAGNOSIS — M7122 Synovial cyst of popliteal space [Baker], left knee: Secondary | ICD-10-CM | POA: Diagnosis not present

## 2018-05-02 DIAGNOSIS — R7989 Other specified abnormal findings of blood chemistry: Secondary | ICD-10-CM | POA: Diagnosis not present

## 2018-05-02 DIAGNOSIS — Z23 Encounter for immunization: Secondary | ICD-10-CM

## 2018-05-02 LAB — TSH: TSH: 6.09 mIU/L — ABNORMAL HIGH (ref 0.40–4.50)

## 2018-05-02 NOTE — Progress Notes (Signed)
Stop ace.  Check with Dr. Radford Pax.

## 2018-05-02 NOTE — Progress Notes (Signed)
Location:  [facility] clinic Provider:  Ewa Hipp L. Mariea Clonts, D.O., C.M.D.  Goals of Care:  Advanced Directives 05/02/2018  Does Patient Have a Medical Advance Directive? Yes  Type of Advance Directive Living will  Does patient want to make changes to medical advance directive? -  Copy of [place] in Chart? -  Pre-existing out of facility DNR order (yellow form or pink MOST form) -   Chief Complaint  Patient presents with  . Medical Management of Chronic Issues    64mth follow-up    HPI: Patient is a 84 y.o. male seen today for medical management of chronic diseases.    Pt and his daughter report that he is not on lisinopril anymore.  His bp was at high end at first but had just arrived and walked in.  Also gets dizzy at times with lower bps.  Pt out of lisinopril.  bp was good on it, but now he's out.  Was ranging in 258N systolic most days by his report.  Dizziness goes away when he sits down.  Explained that last note from Dr. Radford Pax indicates he was to continue the low dose lisinopril b/c of his systolic function.    He was having difficulty with constipation.  He was on miralax daily, but backed down to every couple of days and bowels are moving.  No diarrhea.  He says he only uses 1/2 [place] on rare occasion if he cannot rest, not using it regularly.    Left knee is doing better.  Used some aleve for his knee (2 the other day). Discussed being careful about using often.  His daughter reports he is doing very well--she had breast cancer this summer.  He did well being pretty independent during that time. He'd forgotten his appt this morning.    He got his flu shot with Clarise Cruz this morning.  Weight is stable.   MMSE - Mini Mental State Exam 05/02/2018 03/01/2017 12/09/2015  Orientation to time 5 5 3   Orientation to Place 5 5 4   Registration 3 3 3   Attention/ Calculation 4 5 4   Recall 0 0 1  Language- name 2 objects 2 2 2   Language- repeat 1 1 1   Language- follow 3 step  command 3 3 3   Language- read & follow direction 1 1 1   Write a sentence 1 1 1   Copy design 1 0 1  Total score 26 26 24   Passed clock  Past Medical History:  Diagnosis Date  . Arthritis   . Cerebral ischemia   . CKD (chronic kidney disease), stage III ([facility])   . Colon cancer [facility])     s/p partial colectomy  . Complication of anesthesia    "woke up w/confusion and hallucinations once after mitral valve OR"  . Coronary artery disease    a. pre-surgical cath 2010 showed 50-60% of the LAD with negative nuclear stress test at that time. b. normal nuc 2016.  . First degree AV block   . Glaucoma   . Hernia   . HTN (hypertension)    takes Amlodipine daily  . Hyperlipidemia   . Major depression   . Mild cognitive impairment   . Mitral regurgitation    a. mild-mod MR by echo 08/2017.  . Mobitz type 1 second degree atrioventricular block   . Mobitz type 2 second degree atrioventricular block   . Moderate aortic insufficiency   . MVP (mitral valve prolapse)    S/P Rt mini thoractomy for  Mitral Valve repair  . Paroxysmal atrial fibrillation (HCC)    S/P Maze procedure  . 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 Premature atrial contractions   . Prostate cancer ([place])   . PVC's (premature ventricular contractions)   . RBBB   . 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 Sleep disturbance   . Thoracic aortic aneurysm ([place])    a. last echo 08/2017: mildly dilated aortic root.  . Thrombocytopenia ([place])   . Wandering atrial pacemaker     Past Surgical History:  Procedure Laterality Date  . CATARACT EXTRACTION W/ INTRAOCULAR LENS  IMPLANT, BILATERAL Bilateral   . COLECTOMY  2004   Dr [name]  . DIRECT LARYNGOSCOPY N/A 01/15/2016   Procedure: DIRECT LARYNGOSCOPY WITH BIOPSY AND FROZEN SECTION;  Surgeon: Izora Gala, MD;  Location: [place];  Service: ENT;  Laterality: N/A;  . FlexHD patch repair of chest wall hernia.   01/28/2011   [name]  . GASTROSTOMY W/ FEEDING TUBE    . IR GENERIC HISTORICAL  10/08/2016   IR GASTROSTOMY TUBE REMOVAL 10/08/2016 Sandi Mariscal, MD [facility]-INTERV RAD  . JOINT REPLACEMENT    . LARYNGETOMY N/A 01/15/2016   Procedure:  TOTAL LARYNGECTOMY;  Surgeon: Izora Gala, MD;  [facility];  Service: ENT;  Laterality: N/A;  . MAZE  12/27/2008   left side lesion set  . MICROLARYNGOSCOPY Right 01/29/2015   Procedure: MICROLARYNGOSCOPY WITH BIOSPY OF RIGHT VOCAL CORD;  Surgeon: Izora Gala, MD;  Location: [place];  Service: ENT;  Laterality: Right;  . MITRAL VALVE REPAIR  12/27/2008   complex valvuloplasty with 61mm Memo 3D annuloplasty ring via right minithoracotomy  . PECTORALIS FLAP Left 05/04/2016   Procedure: PECTORALIS FLAP to neck with possible, split thickness skin graft;  Surgeon: Loel Lofty Dillingham, DO;  Location: [place];  Service: Plastics;  Laterality: Left;  . PROSTATE BIOPSY    . SKIN SPLIT GRAFT Left 05/04/2016   Procedure: PECTORALIS MAJOR MYOCUTANEOUS FLAP RECONSTRUCTION OF PHARYNX AND SPLIT THICKNESS SKIN GRAFT;  Surgeon: Izora Gala, MD;  Location: [place];  Service: ENT;  Laterality: Left;  . TEE WITHOUT CARDIOVERSION N/A 01/03/2013   Procedure: TRANSESOPHAGEAL ECHOCARDIOGRAM (TEE);  Surgeon: Sueanne Margarita, MD;  Location: [place];  Service: Cardiovascular;  Laterality: N/A;  . TOTAL KNEE ARTHROPLASTY Right 2003  . TRACHEAL ESOPHAGEAL PUNCTURE REPAIR N/A 03/26/2016   Procedure: TRACHEAL ESOPHAGEAL PUNCTURE;  Surgeon: Izora Gala, MD;  Location[facility];  Service: ENT;  Laterality: N/A;  . TRACHEOESOPHAGEAL FISTULA REPAIR N/A 03/26/2016   Procedure: TRACHEO-ESOPHAGEAL   PUNCTURE,FISTULAR CLOSURE;  Surgeon: Izora Gala, MD;  Location: [facility];  Service: ENT;  Laterality: N/A;  . TRACHEOESOPHAGEAL FISTULA REPAIR N/A 04/09/2016   Procedure: FISTULA REPAIR WITH  MUSCLE ROTATION FLAP;  Surgeon: Izora Gala, MD;  Location: [place];  Service: ENT;  Laterality: N/A;  . TRACHEOESOPHAGEAL FISTULA REPAIR  N/A 07/27/2016   Procedure: CLOSURE OF FISTULA;  Surgeon: Izora Gala, MD;  Location: [place];  Service: ENT;  Laterality: N/A;  . TYMPANOPLASTY  1967   "? side"    Allergies  Allergen Reactions  . Metoprolol Other (See Comments)    Hypersensitive to beta blockers with associated hypotension and bradycardia  . Citalopram Nausea Only  . Trazodone And Nefazodone Other (See Comments)    Dry mouth  . Zoloft [Sertraline Hcl] Other (See Comments)    dizzy    Outpatient Encounter Medications as  of 05/02/2018  Medication Sig  . atorvastatin (LIPITOR) 40 MG tablet TAKE ONE TABLET BY MOUTH DAILY  . ELIQUIS 5 MG TABS tablet TAKE ONE TABLET BY MOUTH TWICE A DAY  . furosemide (LASIX) 40 MG tablet Take 1 tablet (40 mg total) by mouth every morning AND 0.5 tablets (20 mg total) every evening.  Marland Kitchen lisinopril (PRINIVIL,ZESTRIL) 2.5 MG tablet Take 1 tablet (2.5 mg total) by mouth daily.  Marland Kitchen zolpidem (AMBIEN) 5 MG tablet TAKE 1 TABLET BY MOUTH EVERY NIGHT AT BEDTIME AS NEEDED FOR SLEEP   No facility-administered encounter medications on file as of 05/02/2018.     Review of Systems:  Review of Systems  Constitutional: Negative for chills and fever.  HENT: Positive for hearing loss.        Layrngectomy  Eyes: Negative for blurred vision.  Respiratory: Negative for cough and shortness of breath.   Cardiovascular: Negative for chest pain, palpitations and leg swelling.  Gastrointestinal: Negative for abdominal pain, blood in stool, constipation, diarrhea and melena.       Bowels moving with miralax  Genitourinary: Negative for dysuria.  Musculoskeletal: Positive for joint pain. Negative for falls.  Neurological: Positive for dizziness. Negative for loss of consciousness, weakness and headaches.  Psychiatric/Behavioral: Positive for memory loss. Negative for depression. The patient is not nervous/anxious and does not have insomnia.        Some mild cognitive impairment, his daughter helps with meds at  pharmacy, but he takes them on his own and lives independently at [facility] quality of life since laryngectomy not so good    Health Maintenance  Topic Date Due  . INFLUENZA VACCINE  03/10/2018  . TETANUS/TDAP  08/11/2023  . PNA vac Low Risk Adult  Completed    Physical Exam: Vitals:   05/02/18 1012  BP: 140/64  Pulse: 93  Temp: 97.7 F (36.5 C)  TempSrc: Oral  SpO2: 97%  Weight: 178 lb (80.7 kg)  Height: 5\' 6"  (1.676 m)   Body mass index is 28.73 kg/m. Physical Exam  Constitutional: He appears well-developed. No distress.  HENT:  Head: Normocephalic and atraumatic.  Neck:  Laryngectomy  Cardiovascular: Normal rate, regular rhythm, normal heart sounds and intact distal pulses.  Pulmonary/Chest: Effort normal and breath sounds normal. No respiratory distress.  Abdominal: Soft. Bowel sounds are normal. He exhibits no distension.  Musculoskeletal: Normal range of motion. He exhibits no tenderness.  Ambulates with rollator walker  Neurological: He is alert.  Skin: Skin is warm and dry. Capillary refill takes less than 2 seconds.  Psychiatric: He has a normal mood and affect.    Labs reviewed: Basic Metabolic Panel: Recent Labs    07/08/17 0859  08/11/17 1819  08/16/17 0720 08/27/17 09/09/17 1205 11/04/17 1225  NA 141   < >  --    < > 142  --  140 142  K 4.4   < >  --    < > 3.5  --  4.1 4.8  CL 104   < >  --    < > 106  --  100 106  CO2 28   < >  --    < > 26  --  24 27  GLUCOSE 92   < >  --    < > 84  --  98 89  BUN 22   < >  --    < > 32*  --  24 20  CREATININE 1.13*   < >  --    < >  1.35*  --  1.06 1.15*  CALCIUM 9.2   < >  --    < > 8.2*  --  8.7 9.1  TSH 8.07*  --  7.512*  --   --  5.26  --   --    < > = values in this interval not displayed.   Liver Function Tests: Recent Labs    08/10/17 2037 11/04/17 1225  AST 23 13  ALT 15* 6*  ALKPHOS 93  --   BILITOT 1.4* 1.0  PROT 6.4* 6.7  ALBUMIN 3.4*  --    No results for input(s): LIPASE,  AMYLASE in the last 8760 hours. No results for input(s): AMMONIA in the last 8760 hours. CBC: Recent Labs    07/08/17 0859  08/11/17 0543  08/15/17 0407 08/16/17 0720 11/04/17 1225  WBC 7.5   < > 7.3   < > 7.9 6.8 7.8  NEUTROABS 5,325  --  5.6  --   --   --  5,639  HGB 14.4   < > 15.6   < > 15.1 14.3 14.8  HCT 43.2   < > 47.0   < > 47.1 43.9 43.0  MCV 93.7   < > 97.5   < > 98.1 99.5 94.3  PLT 182   < > 128*   < > 127* 109* 170   < > = values in this interval not displayed.   Lipid Panel: No results for input(s): CHOL, HDL, LDLCALC, TRIG, CHOLHDL, LDLDIRECT in the last 8760 hours. Lab Results  Component Value Date   HGBA1C 5.5 08/11/2017    Assessment/Plan 1. Constipation, unspecified constipation type -better with miralax--currently using every other day -will recheck TSH, but suspect was abnormal post surgery and inflammation, recurrent fistulas he had in his neck  2. Insomnia, unspecified type -cont rare ambien--I've tried to get him off this to no avail--cannot sleep with alternatives  3. Paroxysmal atrial fibrillation (HCC) -cont eliquis, no rate control issues  4. Baker cyst, left - no problems recently -advised RARE aleve ok but not to use daily  5. Abnormal TSH - suspect was related to surgeries and fistulas, f/u lab due to recent increased constipation (his daughter is not sure his history was accurate about no bm in over a week at time of that visit) - TSH  6. S/P laryngectomy -uses electrolarynx, frustrated that others where he lives do not understand him well, affects QOL  7.  Dizziness -it's very difficult to ascertain whether this is confusion or true dizziness when standing and walking vs sitting -seems it's a mixture to him  -reports bps in 120s typically at max when on lisinopril--currently out--will check with Dr. Radford Pax to make sure she still wants him to take with his dizziness and reports of poor quality of life at this age with his  laryngectomy--he wants to focus on QOL at this point and doesn't want to be dizzy  Got flu shot with RN at [facility] before this  Labs/tests ordered:   Orders Placed This Encounter  Procedures  . TSH    Next appt:  4 mos med mgt  Lylla Eifler L. Bernisha Verma, D.O. [facility] [address], [place] [address] Cell Phone (Mon-Fri 8am-5pm):  [phone] On Call:  [phone] & follow prompts after 5pm & weekends Office Phone:  [phone] Office Fax:  [phone]

## 2018-05-02 NOTE — Patient Instructions (Signed)
Timothy Suarez , Thank you for taking time to come for your Medicare Wellness Visit. I appreciate your ongoing commitment to your health goals. Please review the following plan we discussed and let me know if I can assist you in the future.   Screening recommendations/referrals: Colonoscopy excluded, over age 82 Recommended yearly ophthalmology/optometry visit for glaucoma screening and checkup Recommended yearly dental visit for hygiene and checkup  Vaccinations: Influenza vaccine high dose given today Pneumococcal vaccine up to date, completed Tdap vaccine up to date, due 08/11/2023 Shingles vaccine due, prescription sent to pharmacy    Advanced directives: Please bring [place] a copy  Conditions/risks identified: none  Next appointment: Timothy Dense, RN 05/05/2019 @ 10am  Preventive Care 65 Years and Older, Male Preventive care refers to lifestyle choices and visits with your health care provider that can promote health and wellness. What does preventive care include?  A yearly physical exam. This is also called an annual well check.  Dental exams once or twice a year.  Routine eye exams. Ask your health care provider how often you should have your eyes checked.  Personal lifestyle choices, including:  Daily care of your teeth and gums.  Regular physical activity.  Eating a healthy diet.  Avoiding tobacco and drug use.  Limiting alcohol use.  Practicing safe sex.  Taking low doses of aspirin every day.  Taking vitamin and mineral supplements as recommended by your health care provider. What happens during an annual well check? The services and screenings done by your health care provider during your annual well check will depend on your age, overall health, lifestyle risk factors, and family history of disease. Counseling  Your health care provider may ask you questions about your:  Alcohol use.  Tobacco use.  Drug use.  Emotional well-being.  Home and  relationship well-being.  Sexual activity.  Eating habits.  History of falls.  Memory and ability to understand (cognition).  Work and work Statistician. Screening  You may have the following tests or measurements:  Height, weight, and BMI.  Blood pressure.  Lipid and cholesterol levels. These may be checked every 5 years, or more frequently if you are over 6 years old.  Skin check.  Lung cancer screening. You may have this screening every year starting at age 7 if you have a 30-pack-year history of smoking and currently smoke or have quit within the past 15 years.  Fecal occult blood test (FOBT) of the stool. You may have this test every year starting at age 74.  Flexible sigmoidoscopy or colonoscopy. You may have a sigmoidoscopy every 5 years or a colonoscopy every 10 years starting at age 30.  Prostate cancer screening. Recommendations will vary depending on your family history and other risks.  Hepatitis C blood test.  Hepatitis B blood test.  Sexually transmitted disease (STD) testing.  Diabetes screening. This is done by checking your blood sugar (glucose) after you have not eaten for a while (fasting). You may have this done every 1-3 years.  Abdominal aortic aneurysm (AAA) screening. You may need this if you are a current or former smoker.  Osteoporosis. You may be screened starting at age 36 if you are at high risk. Talk with your health care provider about your test results, treatment options, and if necessary, the need for more tests. Vaccines  Your health care provider may recommend certain vaccines, such as:  Influenza vaccine. This is recommended every year.  Tetanus, diphtheria, and acellular pertussis (Tdap, Td) vaccine. You  may need a Td booster every 10 years.  Zoster vaccine. You may need this after age 27.  Pneumococcal 13-valent conjugate (PCV13) vaccine. One dose is recommended after age 30.  Pneumococcal polysaccharide (PPSV23) vaccine. One  dose is recommended after age 65. Talk to your health care provider about which screenings and vaccines you need and how often you need them. This information is not intended to replace advice given to you by your health care provider. Make sure you discuss any questions you have with your health care provider. Document Released: 08/23/2015 Document Revised: 04/15/2016 Document Reviewed: 05/28/2015 Elsevier Interactive Patient Education  2017 [place] Prevention in the Home Falls can cause injuries. They can happen to people of all ages. There are many things you can do to make your home safe and to help prevent falls. What can I do on the outside of my home?  Regularly fix the edges of walkways and driveways and fix any cracks.  Remove anything that might make you trip as you walk through a door, such as a raised step or threshold.  Trim any bushes or trees on the path to your home.  Use bright outdoor lighting.  Clear any walking paths of anything that might make someone trip, such as rocks or tools.  Regularly check to see if handrails are loose or broken. Make sure that both sides of any steps have handrails.  Any raised decks and porches should have guardrails on the edges.  Have any leaves, snow, or ice cleared regularly.  Use sand or salt on walking paths during winter.  Clean up any spills in your garage right away. This includes oil or grease spills. What can I do in the bathroom?  Use night lights.  Install grab bars by the toilet and in the tub and shower. Do not use towel bars as grab bars.  Use non-skid mats or decals in the tub or shower.  If you need to sit down in the shower, use a plastic, non-slip stool.  Keep the floor dry. Clean up any water that spills on the floor as soon as it happens.  Remove soap buildup in the tub or shower regularly.  Attach bath mats securely with double-sided non-slip rug tape.  Do not have throw rugs and other  things on the floor that can make you trip. What can I do in the bedroom?  Use night lights.  Make sure that you have a light by your bed that is easy to reach.  Do not use any sheets or blankets that are too big for your bed. They should not hang down onto the floor.  Have a firm chair that has side arms. You can use this for support while you get dressed.  Do not have throw rugs and other things on the floor that can make you trip. What can I do in the kitchen?  Clean up any spills right away.  Avoid walking on wet floors.  Keep items that you use a lot in easy-to-reach places.  If you need to reach something above you, use a strong step stool that has a grab bar.  Keep electrical cords out of the way.  Do not use floor polish or wax that makes floors slippery. If you must use wax, use non-skid floor wax.  Do not have throw rugs and other things on the floor that can make you trip. What can I do with my stairs?  Do not leave any items  on the stairs.  Make sure that there are handrails on both sides of the stairs and use them. Fix handrails that are broken or loose. Make sure that handrails are as long as the stairways.  Check any carpeting to make sure that it is firmly attached to the stairs. Fix any carpet that is loose or worn.  Avoid having throw rugs at the top or bottom of the stairs. If you do have throw rugs, attach them to the floor with carpet tape.  Make sure that you have a light switch at the top of the stairs and the bottom of the stairs. If you do not have them, ask someone to add them for you. What else can I do to help prevent falls?  Wear shoes that:  Do not have high heels.  Have rubber bottoms.  Are comfortable and fit you well.  Are closed at the toe. Do not wear sandals.  If you use a stepladder:  Make sure that it is fully opened. Do not climb a closed stepladder.  Make sure that both sides of the stepladder are locked into place.  Ask  someone to hold it for you, if possible.  Clearly mark and make sure that you can see:  Any grab bars or handrails.  First and last steps.  Where the edge of each step is.  Use tools that help you move around (mobility aids) if they are needed. These include:  Canes.  Walkers.  Scooters.  Crutches.  Turn on the lights when you go into a dark area. Replace any light bulbs as soon as they burn out.  Set up your furniture so you have a clear path. Avoid moving your furniture around.  If any of your floors are uneven, fix them.  If there are any pets around you, be aware of where they are.  Review your medicines with your doctor. Some medicines can make you feel dizzy. This can increase your chance of falling. Ask your doctor what other things that you can do to help prevent falls. This information is not intended to replace advice given to you by your health care provider. Make sure you discuss any questions you have with your health care provider. Document Released: 05/23/2009 Document Revised: 01/02/2016 Document Reviewed: 08/31/2014 Elsevier Interactive Patient Education  2017 [employer].

## 2018-05-02 NOTE — Progress Notes (Signed)
Subjective:   Timothy Suarez is a 84 y.o. male who presents for Medicare Annual/Subsequent preventive examination.  Last AWV-03/01/2017    Objective:    Vitals: BP 140/64 (BP Location: Left Arm, Patient Position: Sitting)   Pulse 93   Temp (!) 97.4 F (36.3 C) (Oral)   Ht 5\' 6"  (1.676 m)   Wt 178 lb (80.7 kg)   SpO2 97%   BMI 28.73 kg/m   Body mass index is 28.73 kg/m.  Advanced Directives 05/02/2018 03/28/2018 11/15/2017 09/16/2017 08/12/2017 08/10/2017 07/08/2017  Does Patient Have a Medical Advance Directive? Yes Yes Yes Yes Yes Yes Yes  Type of Advance Directive Living will Living will Living will Living will [place];Living will [place];Living will Living will  Does patient want to make changes to medical advance directive? - - No - Patient declined No - Patient declined No - Patient declined - No - Patient declined  Copy of [place] in Chart? - - - - No - copy requested - -  Pre-existing out of facility DNR order (yellow form or pink MOST form) - - - - - - -    Tobacco Social History   Tobacco Use  Smoking Status Never Smoker  Smokeless Tobacco Never Used     Counseling given: Not Answered   Clinical Intake:  Pre-visit preparation completed: No  Pain : No/denies pain     Diabetes: No  How often do you need to have someone help you when you read instructions, pamphlets, or other written materials from your doctor or pharmacy?: 1 - Never What is the last grade level you completed in school?: Med school  Interpreter Needed?: No  Information entered by :: [name], RN  Past Medical History:  Diagnosis Date  . Arthritis   . Cerebral ischemia   . CKD (chronic kidney disease), stage III ([place])   . Colon cancer [facility])     s/p partial colectomy  . Complication of anesthesia    "woke up w/confusion and hallucinations once after mitral valve OR"  . Coronary artery disease    a. pre-surgical cath  2010 showed 50-60% of the LAD with negative nuclear stress test at that time. b. normal nuc 2016.  . First degree AV block   . Glaucoma   . Hernia   . HTN (hypertension)    takes Amlodipine daily  . Hyperlipidemia   . Major depression   . Mild cognitive impairment   . Mitral regurgitation    a. mild-mod MR by echo 08/2017.  . Mobitz type 1 second degree atrioventricular block   . Mobitz type 2 second degree atrioventricular block   . Moderate aortic insufficiency   . MVP (mitral valve prolapse)    S/P Rt mini thoractomy for Mitral Valve repair  . Paroxysmal atrial fibrillation (HCC)    S/P Maze procedure  . 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 Premature atrial contractions   . Prostate cancer ([place])   . PVC's (premature ventricular contractions)   . RBBB   . 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 Sleep disturbance   . Thoracic aortic aneurysm ([place])    a. last echo 08/2017: mildly dilated aortic root.  . Thrombocytopenia ([place])   . Wandering atrial pacemaker    Past Surgical History:  Procedure Laterality Date  . CATARACT EXTRACTION W/ INTRAOCULAR LENS  IMPLANT, BILATERAL Bilateral   .  COLECTOMY  2004   Dr [name]  . DIRECT LARYNGOSCOPY N/A 01/15/2016   Procedure: DIRECT LARYNGOSCOPY WITH BIOPSY AND FROZEN SECTION;  Surgeon: Izora Gala, MD;  Location: [place];  Service: ENT;  Laterality: N/A;  . FlexHD patch repair of chest wall hernia.  01/28/2011   [name]  . GASTROSTOMY W/ FEEDING TUBE    . IR GENERIC HISTORICAL  10/08/2016   IR GASTROSTOMY TUBE REMOVAL 10/08/2016 Sandi Mariscal, MD [facility]-INTERV RAD  . JOINT REPLACEMENT    . LARYNGETOMY N/A 01/15/2016   Procedure:  TOTAL LARYNGECTOMY;  Surgeon: Izora Gala, MD;  Location: [facility] OR;  Service: ENT;  Laterality: N/A;  . MAZE  12/27/2008   left side lesion set  . MICROLARYNGOSCOPY Right 01/29/2015   Procedure: MICROLARYNGOSCOPY WITH BIOSPY OF RIGHT VOCAL CORD;  Surgeon:  Izora Gala, MD;  Location: [place];  Service: ENT;  Laterality: Right;  . MITRAL VALVE REPAIR  12/27/2008   complex valvuloplasty with 92mm Memo 3D annuloplasty ring via right minithoracotomy  . PECTORALIS FLAP Left 05/04/2016   Procedure: PECTORALIS FLAP to neck with possible, split thickness skin graft;  Surgeon: Loel Lofty Dillingham, DO;  Location: [place];  Service: Plastics;  Laterality: Left;  . PROSTATE BIOPSY    . SKIN SPLIT GRAFT Left 05/04/2016   Procedure: PECTORALIS MAJOR MYOCUTANEOUS FLAP RECONSTRUCTION OF PHARYNX AND SPLIT THICKNESS SKIN GRAFT;  Surgeon: Izora Gala, MD;  Location: [place];  Service: ENT;  Laterality: Left;  . TEE WITHOUT CARDIOVERSION N/A 01/03/2013   Procedure: TRANSESOPHAGEAL ECHOCARDIOGRAM (TEE);  Surgeon: Sueanne Margarita, MD;  Location: [place];  Service: Cardiovascular;  Laterality: N/A;  . TOTAL KNEE ARTHROPLASTY Right 2003  . TRACHEAL ESOPHAGEAL PUNCTURE REPAIR N/A 03/26/2016   Procedure: TRACHEAL ESOPHAGEAL PUNCTURE;  Surgeon: Izora Gala, MD;  Location: [place];  Service: ENT;  Laterality: N/A;  . TRACHEOESOPHAGEAL FISTULA REPAIR N/A 03/26/2016   Procedure: TRACHEO-ESOPHAGEAL   PUNCTURE,FISTULAR CLOSURE;  Surgeon: Izora Gala, MD;  Location: [place];  Service: ENT;  Laterality: N/A;  . TRACHEOESOPHAGEAL FISTULA REPAIR N/A 04/09/2016   Procedure: FISTULA REPAIR WITH  MUSCLE ROTATION FLAP;  Surgeon: Izora Gala, MD;  Location: [place];  Service: ENT;  Laterality: N/A;  . TRACHEOESOPHAGEAL FISTULA REPAIR N/A 07/27/2016   Procedure: CLOSURE OF FISTULA;  Surgeon: Izora Gala, MD;  Location: [place];  Service: ENT;  Laterality: N/A;  . TYMPANOPLASTY  1967   "? side"   Family History  Problem Relation Age of Onset  . Cancer Mother        lymphoma  . Cancer Father        pancreatic  . Heart attack Neg Hx   . Stroke Neg Hx    Social History   Socioeconomic History  . Marital status: Widowed    Spouse name: Not on file  . Number of children: 2  . Years of  education: 85  . Highest education level: Not on file  Occupational History  . Occupation: retired Dispensing optician  . Financial resource strain: Not hard at all  . Food insecurity:    Worry: Never true    Inability: Never true  . Transportation needs:    Medical: No    Non-medical: No  Tobacco Use  . Smoking status: Never Smoker  . Smokeless tobacco: Never Used  Substance and Sexual Activity  . Alcohol use: Yes    Alcohol/week: 0.0 standard drinks    Comment: rare  . Drug use: No  . Sexual  activity: Never  Lifestyle  . Physical activity:    Days per week: 0 days    Minutes per session: 0 min  . Stress: Not at all  Relationships  . Social connections:    Talks on phone: More than three times a week    Gets together: More than three times a week    Attends religious service: Never    Active member of club or organization: No    Attends meetings of clubs or organizations: Never    Relationship status: Widowed  Other Topics Concern  . Not on file  Social History Narrative   Widowed   Never smoked   Alcohol  Occasionally    Exercise - walking the [employer], retail with [employer], HCPOA             Outpatient Encounter Medications as of 05/02/2018  Medication Sig  . atorvastatin (LIPITOR) 40 MG tablet TAKE ONE TABLET BY MOUTH DAILY  . ELIQUIS 5 MG TABS tablet TAKE ONE TABLET BY MOUTH TWICE A DAY  . furosemide (LASIX) 40 MG tablet Take 1 tablet (40 mg total) by mouth every morning AND 0.5 tablets (20 mg total) every evening.  Marland Kitchen lisinopril (PRINIVIL,ZESTRIL) 2.5 MG tablet Take 1 tablet (2.5 mg total) by mouth daily.  Marland Kitchen zolpidem (AMBIEN) 5 MG tablet TAKE 1 TABLET BY MOUTH EVERY NIGHT AT BEDTIME AS NEEDED FOR SLEEP  . Zoster Vaccine Adjuvanted [facility]) injection Inject 0.5 mLs into the muscle once.   No facility-administered encounter medications on file as of 05/02/2018.     Activities of Daily Living In your present state of  health, do you have any difficulty performing the following activities: 05/02/2018 08/11/2017  Hearing? N Y  Vision? N N  Difficulty concentrating or making decisions? N N  Walking or climbing stairs? Y Y  Dressing or bathing? N Y  Doing errands, shopping? Y N  Preparing Food and eating ? N -  Using the Toilet? N -  In the past six months, have you accidently leaked urine? N -  Do you have problems with loss of bowel control? N -  Managing your Medications? N -  Managing your Finances? N -  Housekeeping or managing your Housekeeping? N -  Some recent data might be hidden    Patient Care Team: Gayland Curry, DO as PCP - General (Geriatric Medicine) Sueanne Margarita, MD as PCP - Cardiology (Cardiology) Sueanne Margarita, MD as Consulting Physician (Cardiology) Tyler Pita, MD as Consulting Physician (Radiation Oncology) Izora Gala, MD as Consulting Physician (Otolaryngology)   Assessment:   This is a routine wellness examination for Ryett.  Exercise Activities and Dietary recommendations Current Exercise Habits: The patient does not participate in regular exercise at present, Exercise limited by: orthopedic condition(s)  Goals    . Increase water intake     Patient will drink more water.       Fall Risk Fall Risk  05/02/2018 03/28/2018 11/15/2017 11/04/2017 09/16/2017  Falls in the past year? No No No No No  Number falls in past yr: - - - - -  Injury with Fall? - - - - -  Risk for fall due to : - - - - -  Follow up - - - - -   Is the patient's home free of loose throw rugs in walkways, pet beds, electrical cords, etc?   yes  Grab bars in the bathroom? yes      Handrails on the stairs?   yes      Adequate lighting?   yes  Depression Screen PHQ 2/9 Scores 05/02/2018 11/15/2017 11/04/2017 09/16/2017  PHQ - 2 Score 0 0 0 0  PHQ- 9 Score - - - -    Cognitive Function MMSE - Mini Mental State Exam 05/02/2018 03/01/2017 12/09/2015 11/09/2014 04/02/2014  Orientation to time 5 5 3  5 4   Orientation to Place 5 5 4 5 5   Registration 3 3 3 3 3   Attention/ Calculation 4 5 4 5 3   Recall 0 0 1 1 1   Language- name 2 objects 2 2 2 2 2   Language- repeat 1 1 1 1 1   Language- follow 3 step command 3 3 3 3 3   Language- read & follow direction 1 1 1 1 1   Write a sentence 1 1 1 1 1   Copy design 1 0 1 1 1   Total score 26 26 24 28 25         Immunization History  Administered Date(s) Administered  . Influenza, High Dose Seasonal PF 05/20/2017, 05/02/2018  . Influenza,inj,Quad PF,6+ Mos 05/02/2015, 06/19/2016  . Influenza-Unspecified 05/10/2013, 06/11/2014  . Pneumococcal Conjugate-13 06/11/2014  . Pneumococcal Polysaccharide-23 08/10/2013  . Td 08/10/2013    Qualifies for Shingles Vaccine? Yes, educated and ordered to pharmacy  Screening Tests Health Maintenance  Topic Date Due  . Samul Dada  08/11/2023  . INFLUENZA VACCINE  Completed  . PNA vac Low Risk Adult  Completed   Cancer Screenings: Lung: Low Dose CT Chest recommended if Age 33-80 years, 30 pack-year currently smoking OR have quit w/in 15years. Patient does not qualify. Colorectal: up to date  Additional Screenings:  Hepatitis C Screening: declined Flu vaccine due: high dose given      Plan:    I have personally reviewed and addressed the Medicare Annual Wellness questionnaire and have noted the following in the patient's chart:  A. Medical and social history B. Use of alcohol, tobacco or illicit drugs  C. Current medications and supplements D. Functional ability and status E.  Nutritional status F.  Physical activity G. Advance directives H. List of other physicians I.  Hospitalizations, surgeries, and ER visits in previous 12 months J.  [place] to include hearing, vision, cognitive, depression L. Referrals and appointments - none  In addition, I have reviewed and discussed with patient certain preventive protocols, quality metrics, and best practice recommendations. A written  personalized care plan for preventive services as well as general preventive health recommendations were provided to patient.  See attached scanned questionnaire for additional information.   Signed,   [name], RN Nurse Health Advisor  Patient Concerns: None

## 2018-05-03 ENCOUNTER — Telehealth: Payer: Self-pay | Admitting: *Deleted

## 2018-05-03 NOTE — Telephone Encounter (Signed)
Spoke with Jackelyn Poling and advised results

## 2018-05-03 NOTE — Telephone Encounter (Signed)
-----   Message from Gayland Curry, DO sent at 05/02/2018  5:00 PM EDT ----- Please notify Jackelyn Poling that pt may stop the lisinopril. I checked with Dr. Radford Pax.

## 2018-05-11 ENCOUNTER — Other Ambulatory Visit: Payer: Self-pay | Admitting: Internal Medicine

## 2018-05-11 DIAGNOSIS — G4700 Insomnia, unspecified: Secondary | ICD-10-CM

## 2018-05-11 NOTE — Telephone Encounter (Signed)
rx called into pharmacy

## 2018-06-07 DIAGNOSIS — H52203 Unspecified astigmatism, bilateral: Secondary | ICD-10-CM | POA: Diagnosis not present

## 2018-06-07 DIAGNOSIS — Z961 Presence of intraocular lens: Secondary | ICD-10-CM | POA: Diagnosis not present

## 2018-06-07 DIAGNOSIS — H524 Presbyopia: Secondary | ICD-10-CM | POA: Diagnosis not present

## 2018-06-20 ENCOUNTER — Other Ambulatory Visit: Payer: Self-pay | Admitting: Internal Medicine

## 2018-06-20 DIAGNOSIS — G4700 Insomnia, unspecified: Secondary | ICD-10-CM

## 2018-06-21 ENCOUNTER — Other Ambulatory Visit: Payer: Self-pay | Admitting: Internal Medicine

## 2018-06-24 DIAGNOSIS — H5203 Hypermetropia, bilateral: Secondary | ICD-10-CM | POA: Diagnosis not present

## 2018-06-24 DIAGNOSIS — H04123 Dry eye syndrome of bilateral lacrimal glands: Secondary | ICD-10-CM | POA: Diagnosis not present

## 2018-06-24 DIAGNOSIS — Z961 Presence of intraocular lens: Secondary | ICD-10-CM | POA: Diagnosis not present

## 2018-06-24 DIAGNOSIS — H401232 Low-tension glaucoma, bilateral, moderate stage: Secondary | ICD-10-CM | POA: Diagnosis not present

## 2018-06-24 DIAGNOSIS — H52223 Regular astigmatism, bilateral: Secondary | ICD-10-CM | POA: Diagnosis not present

## 2018-07-15 ENCOUNTER — Other Ambulatory Visit: Payer: Self-pay | Admitting: Internal Medicine

## 2018-07-15 DIAGNOSIS — G4700 Insomnia, unspecified: Secondary | ICD-10-CM

## 2018-07-20 ENCOUNTER — Other Ambulatory Visit: Payer: Self-pay | Admitting: Internal Medicine

## 2018-07-20 DIAGNOSIS — G4700 Insomnia, unspecified: Secondary | ICD-10-CM

## 2018-07-20 NOTE — Telephone Encounter (Signed)
[place] Database verified and compliance confirmed   

## 2018-07-29 DIAGNOSIS — H401212 Low-tension glaucoma, right eye, moderate stage: Secondary | ICD-10-CM | POA: Diagnosis not present

## 2018-07-29 DIAGNOSIS — H401223 Low-tension glaucoma, left eye, severe stage: Secondary | ICD-10-CM | POA: Diagnosis not present

## 2018-08-22 DIAGNOSIS — H401212 Low-tension glaucoma, right eye, moderate stage: Secondary | ICD-10-CM | POA: Diagnosis not present

## 2018-08-22 DIAGNOSIS — H401223 Low-tension glaucoma, left eye, severe stage: Secondary | ICD-10-CM | POA: Diagnosis not present

## 2018-08-23 ENCOUNTER — Other Ambulatory Visit: Payer: Self-pay | Admitting: Internal Medicine

## 2018-08-23 DIAGNOSIS — G4700 Insomnia, unspecified: Secondary | ICD-10-CM

## 2018-09-01 ENCOUNTER — Ambulatory Visit: Payer: Medicare Other | Admitting: Internal Medicine

## 2018-09-16 ENCOUNTER — Other Ambulatory Visit: Payer: Self-pay | Admitting: Internal Medicine

## 2018-09-19 DIAGNOSIS — H6123 Impacted cerumen, bilateral: Secondary | ICD-10-CM | POA: Diagnosis not present

## 2018-09-22 ENCOUNTER — Ambulatory Visit: Payer: Medicare Other | Admitting: Internal Medicine

## 2018-10-03 ENCOUNTER — Other Ambulatory Visit: Payer: Self-pay | Admitting: Internal Medicine

## 2018-10-03 DIAGNOSIS — G4700 Insomnia, unspecified: Secondary | ICD-10-CM

## 2018-10-03 NOTE — Telephone Encounter (Signed)
[place] Database verified and compliance confirmed   Last filled 08/25/2018

## 2018-10-04 ENCOUNTER — Other Ambulatory Visit: Payer: Self-pay | Admitting: Internal Medicine

## 2018-10-14 NOTE — Addendum Note (Signed)
Addended by: De Burrs on: 10/14/2018 05:23 PM   Modules accepted: Orders

## 2018-11-03 ENCOUNTER — Ambulatory Visit: Payer: Medicare Other | Admitting: Internal Medicine

## 2018-11-14 ENCOUNTER — Telehealth: Payer: Self-pay | Admitting: *Deleted

## 2018-11-14 ENCOUNTER — Ambulatory Visit: Payer: Medicare Other | Admitting: Internal Medicine

## 2018-11-14 NOTE — Telephone Encounter (Signed)
Jackelyn Poling, daughter called and stated that patient lives at [facility]. Stated that he has "lost it" mentally. Confused, Hallucinating. Daughter is thinking that he has a UTI. They cannot take him out of the facility. Daughter is wanting to know if a antibiotic could be called in.  Please Advise.   Daughter stated that [facility] is going to go and check on him today and take vitals. She stated that she will call back after they check on him.

## 2018-11-14 NOTE — Telephone Encounter (Signed)
I thought a home health nurse was coming out.  Noted that he was baseline per nursing there.  Glad to hear this.  Thanks.  Continue routine monitoring.

## 2018-11-14 NOTE — Telephone Encounter (Signed)
He may have a UTI, but I'm wondering about fever or respiratory symptoms since patients there have had COVID-19.  The vitals from home health will be helpful.  Let's be sure we have a UA c+s order and perhaps the nurse can draw blood when there--cbc with diff and cmp while she's visiting so we can get more information.  These will be for delirium.

## 2018-11-14 NOTE — Telephone Encounter (Signed)
Sounds great. Thanks

## 2018-11-14 NOTE — Telephone Encounter (Signed)
Called and spoke with Izora Gala, she is with [facility], they are private pay and charges $5-8 for 15 minutes. She suggested going to the room four times daily to check patient. They just do the basic and check on patients, remind them to take medications, etc. She stated that she will call us if she notices anything "off" with patient.  Daughter notified and agreed. Daughter stated that she will give them a call to set up.

## 2018-11-14 NOTE — Telephone Encounter (Signed)
Called and spoke with Home health nurse Venetia Constable [phone] and she stated that they cannot draw blood or collect urine, they have no supplies and not skilled nursing. Stated that [facility] does not do this either because he is in independent living.  Nurse stated that when she saw him today he wasn't confused and his vitals were normal, no temp. (couldn't give me the exact number due to [facility] doing vitals.) Stated patient had no complaints with urination. Please Advise.

## 2018-11-16 ENCOUNTER — Other Ambulatory Visit: Payer: Self-pay

## 2018-11-16 ENCOUNTER — Inpatient Hospital Stay (HOSPITAL_COMMUNITY)
Admission: EM | Admit: 2018-11-16 | Discharge: 2018-11-23 | DRG: 469 | Disposition: A | Discharge status: Skilled Nursing Facility | Payer: Medicare Other | Source: Skilled Nursing Facility | Attending: Internal Medicine | Admitting: Internal Medicine

## 2018-11-16 ENCOUNTER — Encounter (HOSPITAL_COMMUNITY): Payer: Self-pay | Admitting: Emergency Medicine

## 2018-11-16 ENCOUNTER — Emergency Department (HOSPITAL_COMMUNITY): Payer: Medicare Other

## 2018-11-16 DIAGNOSIS — Z01810 Encounter for preprocedural cardiovascular examination: Secondary | ICD-10-CM | POA: Diagnosis not present

## 2018-11-16 DIAGNOSIS — Z8546 Personal history of malignant neoplasm of prostate: Secondary | ICD-10-CM

## 2018-11-16 DIAGNOSIS — Z79899 Other long term (current) drug therapy: Secondary | ICD-10-CM | POA: Diagnosis not present

## 2018-11-16 DIAGNOSIS — Z7901 Long term (current) use of anticoagulants: Secondary | ICD-10-CM | POA: Diagnosis not present

## 2018-11-16 DIAGNOSIS — G92 Toxic encephalopathy: Secondary | ICD-10-CM | POA: Diagnosis not present

## 2018-11-16 DIAGNOSIS — I2510 Atherosclerotic heart disease of native coronary artery without angina pectoris: Secondary | ICD-10-CM | POA: Diagnosis present

## 2018-11-16 DIAGNOSIS — Z85038 Personal history of other malignant neoplasm of large intestine: Secondary | ICD-10-CM

## 2018-11-16 DIAGNOSIS — R0902 Hypoxemia: Secondary | ICD-10-CM | POA: Diagnosis not present

## 2018-11-16 DIAGNOSIS — I420 Dilated cardiomyopathy: Secondary | ICD-10-CM | POA: Diagnosis present

## 2018-11-16 DIAGNOSIS — Y92129 Unspecified place in nursing home as the place of occurrence of the external cause: Secondary | ICD-10-CM

## 2018-11-16 DIAGNOSIS — Z8673 Personal history of transient ischemic attack (TIA), and cerebral infarction without residual deficits: Secondary | ICD-10-CM | POA: Diagnosis not present

## 2018-11-16 DIAGNOSIS — Z952 Presence of prosthetic heart valve: Secondary | ICD-10-CM

## 2018-11-16 DIAGNOSIS — Z9049 Acquired absence of other specified parts of digestive tract: Secondary | ICD-10-CM

## 2018-11-16 DIAGNOSIS — Z66 Do not resuscitate: Secondary | ICD-10-CM | POA: Diagnosis present

## 2018-11-16 DIAGNOSIS — T424X5A Adverse effect of benzodiazepines, initial encounter: Secondary | ICD-10-CM | POA: Diagnosis not present

## 2018-11-16 DIAGNOSIS — E876 Hypokalemia: Secondary | ICD-10-CM | POA: Diagnosis present

## 2018-11-16 DIAGNOSIS — I5022 Chronic systolic (congestive) heart failure: Secondary | ICD-10-CM | POA: Diagnosis not present

## 2018-11-16 DIAGNOSIS — I480 Paroxysmal atrial fibrillation: Secondary | ICD-10-CM | POA: Diagnosis not present

## 2018-11-16 DIAGNOSIS — Z96651 Presence of right artificial knee joint: Secondary | ICD-10-CM | POA: Diagnosis present

## 2018-11-16 DIAGNOSIS — T428X5A Adverse effect of antiparkinsonism drugs and other central muscle-tone depressants, initial encounter: Secondary | ICD-10-CM | POA: Diagnosis not present

## 2018-11-16 DIAGNOSIS — I5043 Acute on chronic combined systolic (congestive) and diastolic (congestive) heart failure: Secondary | ICD-10-CM | POA: Diagnosis not present

## 2018-11-16 DIAGNOSIS — N183 Chronic kidney disease, stage 3 (moderate): Secondary | ICD-10-CM | POA: Diagnosis present

## 2018-11-16 DIAGNOSIS — S72002A Fracture of unspecified part of neck of left femur, initial encounter for closed fracture: Secondary | ICD-10-CM | POA: Diagnosis present

## 2018-11-16 DIAGNOSIS — Z01811 Encounter for preprocedural respiratory examination: Secondary | ICD-10-CM | POA: Diagnosis not present

## 2018-11-16 DIAGNOSIS — Z8521 Personal history of malignant neoplasm of larynx: Secondary | ICD-10-CM | POA: Diagnosis not present

## 2018-11-16 DIAGNOSIS — Z930 Tracheostomy status: Secondary | ICD-10-CM | POA: Diagnosis not present

## 2018-11-16 DIAGNOSIS — W010XXA Fall on same level from slipping, tripping and stumbling without subsequent striking against object, initial encounter: Secondary | ICD-10-CM | POA: Diagnosis present

## 2018-11-16 DIAGNOSIS — S51812A Laceration without foreign body of left forearm, initial encounter: Secondary | ICD-10-CM | POA: Diagnosis not present

## 2018-11-16 DIAGNOSIS — S51012A Laceration without foreign body of left elbow, initial encounter: Secondary | ICD-10-CM | POA: Diagnosis not present

## 2018-11-16 DIAGNOSIS — W19XXXA Unspecified fall, initial encounter: Secondary | ICD-10-CM | POA: Diagnosis not present

## 2018-11-16 DIAGNOSIS — N179 Acute kidney failure, unspecified: Secondary | ICD-10-CM | POA: Diagnosis not present

## 2018-11-16 DIAGNOSIS — Z419 Encounter for procedure for purposes other than remedying health state, unspecified: Secondary | ICD-10-CM

## 2018-11-16 DIAGNOSIS — S59902A Unspecified injury of left elbow, initial encounter: Secondary | ICD-10-CM | POA: Diagnosis not present

## 2018-11-16 DIAGNOSIS — M25552 Pain in left hip: Secondary | ICD-10-CM | POA: Diagnosis not present

## 2018-11-16 DIAGNOSIS — D696 Thrombocytopenia, unspecified: Secondary | ICD-10-CM | POA: Diagnosis not present

## 2018-11-16 DIAGNOSIS — E162 Hypoglycemia, unspecified: Secondary | ICD-10-CM | POA: Diagnosis not present

## 2018-11-16 DIAGNOSIS — S72042A Displaced fracture of base of neck of left femur, initial encounter for closed fracture: Secondary | ICD-10-CM | POA: Diagnosis not present

## 2018-11-16 DIAGNOSIS — I130 Hypertensive heart and chronic kidney disease with heart failure and stage 1 through stage 4 chronic kidney disease, or unspecified chronic kidney disease: Secondary | ICD-10-CM | POA: Diagnosis present

## 2018-11-16 DIAGNOSIS — E785 Hyperlipidemia, unspecified: Secondary | ICD-10-CM | POA: Diagnosis not present

## 2018-11-16 DIAGNOSIS — Z96649 Presence of unspecified artificial hip joint: Secondary | ICD-10-CM

## 2018-11-16 DIAGNOSIS — K5900 Constipation, unspecified: Secondary | ICD-10-CM

## 2018-11-16 DIAGNOSIS — S72012A Unspecified intracapsular fracture of left femur, initial encounter for closed fracture: Principal | ICD-10-CM | POA: Diagnosis present

## 2018-11-16 DIAGNOSIS — Z01818 Encounter for other preprocedural examination: Secondary | ICD-10-CM | POA: Diagnosis not present

## 2018-11-16 DIAGNOSIS — I5023 Acute on chronic systolic (congestive) heart failure: Secondary | ICD-10-CM | POA: Diagnosis present

## 2018-11-16 DIAGNOSIS — R739 Hyperglycemia, unspecified: Secondary | ICD-10-CM | POA: Diagnosis present

## 2018-11-16 DIAGNOSIS — I6782 Cerebral ischemia: Secondary | ICD-10-CM | POA: Diagnosis not present

## 2018-11-16 DIAGNOSIS — M1712 Unilateral primary osteoarthritis, left knee: Secondary | ICD-10-CM | POA: Diagnosis not present

## 2018-11-16 DIAGNOSIS — R609 Edema, unspecified: Secondary | ICD-10-CM | POA: Diagnosis not present

## 2018-11-16 DIAGNOSIS — R52 Pain, unspecified: Secondary | ICD-10-CM | POA: Diagnosis not present

## 2018-11-16 DIAGNOSIS — R58 Hemorrhage, not elsewhere classified: Secondary | ICD-10-CM | POA: Diagnosis not present

## 2018-11-16 DIAGNOSIS — I4891 Unspecified atrial fibrillation: Secondary | ICD-10-CM | POA: Diagnosis not present

## 2018-11-16 MED ORDER — BACITRACIN ZINC 500 UNIT/GM EX OINT
TOPICAL_OINTMENT | Freq: Once | CUTANEOUS | Status: AC
  Administered 2018-11-17: 1 via TOPICAL
  Filled 2018-11-16: qty 0.9

## 2018-11-16 NOTE — ED Provider Notes (Signed)
[facility] EMERGENCY DEPARTMENT Provider Note   CSN: [id number] Arrival date & time: 11/16/18  2248    History   Chief Complaint Chief Complaint  Patient presents with   Fall    HPI Timothy Suarez is a 85 y.o. male.     History limited by: Communication made more difficult by patient's need to speak with voice voice synthesizer.     Timothy Suarez is a 85 y.o. male, with a history of A fib, CKD stage III, mitral regurg, hyperlipidemia, HTN, mitral valve prolapse, vocal cord cancer, presenting to the ED for evaluation of injuries from a fall that occurred shortly prior to arrival.  Patient is from a nursing facility and fall was unwitnessed by staff.  Patient states he ambulates with a walker, lost his balance, and fell backward. He complains of most of his pain to the left anterior knee, unable to clearly rate or describe the pain, nonradiating.  Also complains of left hip pain and wounds to the left elbow..  Denies head injury, neck/back pain, LOC, neuro deficits, chest pain, shortness of breath, abdominal pain, numbness, weakness, syncope, or any other complaints.    Past Medical History:  Diagnosis Date   Arthritis    Cerebral ischemia    CKD (chronic kidney disease), stage III ([place])    Colon cancer ([place])     s/p partial colectomy   Complication of anesthesia    "woke up w/confusion and hallucinations once after mitral valve OR"   Coronary artery disease    a. pre-surgical cath 2010 showed 50-60% of the LAD with negative nuclear stress test at that time. b. normal nuc 2016.   First degree AV block    Glaucoma    Hernia    HTN (hypertension)    takes Amlodipine daily   Hyperlipidemia    Major depression    Mild cognitive impairment    Mitral regurgitation    a. mild-mod MR by echo 08/2017.   Mobitz type 1 second degree atrioventricular block    Mobitz type 2 second degree atrioventricular block    Moderate aortic  insufficiency    MVP (mitral valve prolapse)    S/P Rt mini thoractomy for Mitral Valve repair   Paroxysmal atrial fibrillation (HCC)    S/P Maze procedure   Pharyngocutaneous fistula hospitalized 02/21/2016    s/p salvage laryngectomy   Premature atrial contractions    Prostate cancer ([place])    PVC's (premature ventricular contractions)    RBBB    Right vocal cord cancer ([place])    invasive squamous cell carcinoma    Sleep disturbance    Thoracic aortic aneurysm ([place])    a. last echo 08/2017: mildly dilated aortic root.   Thrombocytopenia ([place])    Wandering atrial pacemaker     Patient Active Problem List   Diagnosis Date Noted   Hip fracture ([place]) 11/17/2018   Oropharyngeal dysphagia 11/15/2017   Alaryngeal voice 11/15/2017   Cognitive deficits 09/06/2017   Abnormal TSH 08/26/2017   Thrombocytopenia ([place]) 08/26/2017   Peripheral vascular disease ([place]) 08/26/2017   Hallucination 08/10/2017   Balance problems 07/08/2017   Baker cyst, left 07/08/2017   Dilated cardiomyopathy ([place]) 03/23/4817   Chronic systolic congestive heart failure ([place]) 03/30/2017   Left ventricular ejection fraction of 21% to 30% 12/29/2016   Pulmonary edema 12/06/2016   Presbycusis of both ears 05/25/2016   Protein-calorie malnutrition, severe 02/26/2016   Pharyngocutaneous fistula 02/21/2016   S/P laryngectomy 01/15/2016  History of laryngeal cancer 12/31/2015   Other fatigue 12/09/2015   Stage T1a Squamous Cell Carcinoma of the Right True Vocal Cord 01/30/2015   Prostate cancer ([place]) 01/10/2015   S/P MVR (mitral valve repair) 12/20/2014   S/P Maze operation for atrial fibrillation 12/20/2014   Obesity (BMI 30-39.9) 04/02/2014   Prolonged Q-T interval on ECG 04/02/2014   Mild cognitive impairment 04/02/2014   Major depressive disorder, recurrent episode, mild ([place]) 02/08/2014   Insomnia 02/08/2014   Dizziness and giddiness 02/08/2014   Generalized muscle  weakness 02/08/2014   Glaucoma 02/08/2014   Colon cancer ([place])    Coronary artery disease    Moderate aortic insufficiency 01/03/2013   Mild mitral regurgitation 01/03/2013   Primary open angle glaucoma 12/03/2011   MVP (mitral valve prolapse)    Paroxysmal atrial fibrillation (HCC)    HTN (hypertension)    Depression    chest wall hernia (lung hernia)     Past Surgical History:  Procedure Laterality Date   CATARACT EXTRACTION W/ INTRAOCULAR LENS  IMPLANT, BILATERAL Bilateral    COLECTOMY  2004   Dr [name]   DIRECT LARYNGOSCOPY N/A 01/15/2016   Procedure: DIRECT LARYNGOSCOPY WITH BIOPSY AND FROZEN SECTION;  Surgeon: Izora Gala, MD;  Location: [place];  Service: ENT;  Laterality: N/A;   FlexHD patch repair of chest wall hernia.  01/28/2011   [place]   GASTROSTOMY W/ FEEDING TUBE     IR GENERIC HISTORICAL  10/08/2016   IR GASTROSTOMY TUBE REMOVAL 10/08/2016 Sandi Mariscal, MD [facility]-INTERV RAD   JOINT REPLACEMENT     LARYNGETOMY N/A 01/15/2016   Procedure:  TOTAL LARYNGECTOMY;  Surgeon: Izora Gala, MD;  Location: [place];  Service: ENT;  Laterality: N/A;   MAZE  12/27/2008   left side lesion set   MICROLARYNGOSCOPY Right 01/29/2015   Procedure: MICROLARYNGOSCOPY WITH BIOSPY OF RIGHT VOCAL CORD;  Surgeon: Izora Gala, MD;  Location: [place];  Service: ENT;  Laterality: Right;   MITRAL VALVE REPAIR  12/27/2008   complex valvuloplasty with 57mm Memo 3D annuloplasty ring via right minithoracotomy   PECTORALIS FLAP Left 05/04/2016   Procedure: PECTORALIS FLAP to neck with possible, split thickness skin graft;  Surgeon: Loel Lofty Dillingham, DO;  Location: [place];  Service: Plastics;  Laterality: Left;   PROSTATE BIOPSY     SKIN SPLIT GRAFT Left 05/04/2016   Procedure: PECTORALIS MAJOR MYOCUTANEOUS FLAP RECONSTRUCTION OF PHARYNX AND SPLIT THICKNESS SKIN GRAFT;  Surgeon: Izora Gala, MD;  Location: [place];  Service: ENT;  Laterality: Left;   TEE WITHOUT CARDIOVERSION N/A 01/03/2013    Procedure: TRANSESOPHAGEAL ECHOCARDIOGRAM (TEE);  Surgeon: Sueanne Margarita, MD;  Location: [place];  Service: Cardiovascular;  Laterality: N/A;   TOTAL KNEE ARTHROPLASTY Right 2003   TRACHEAL ESOPHAGEAL PUNCTURE REPAIR N/A 03/26/2016   Procedure: TRACHEAL ESOPHAGEAL PUNCTURE;  Surgeon: Izora Gala, MD;  Location: [facility];  Service: ENT;  Laterality: N/A;   TRACHEOESOPHAGEAL FISTULA REPAIR N/A 03/26/2016   Procedure: TRACHEO-ESOPHAGEAL   PUNCTURE,FISTULAR CLOSURE;  Surgeon: Izora Gala, MD;  Location: [facility];  Service: ENT;  Laterality: N/A;   TRACHEOESOPHAGEAL FISTULA REPAIR N/A 04/09/2016   Procedure: FISTULA REPAIR WITH  MUSCLE ROTATION FLAP;  Surgeon: Izora Gala, MD;  Location: [place];  Service: ENT;  Laterality: N/A;   TRACHEOESOPHAGEAL FISTULA REPAIR N/A 07/27/2016   Procedure: CLOSURE OF FISTULA;  Surgeon: Izora Gala, MD;  Location: [place];  Service: ENT;  Laterality: N/A;   TYMPANOPLASTY  1967   "? side"  Home Medications    Prior to Admission medications   Medication Sig Start Date End Date Taking? Authorizing Provider  atorvastatin (LIPITOR) 40 MG tablet TAKE ONE TABLET BY MOUTH DAILY 09/16/18   Reed, Tiffany L, DO  ELIQUIS 5 MG TABS tablet TAKE ONE TABLET BY MOUTH TWICE A DAY 01/14/18   Sueanne Margarita, MD  furosemide (LASIX) 40 MG tablet Take 1 tablet (40 mg total) by mouth every morning AND 0.5 tablets (20 mg total) every evening. 10/20/17 10/20/18  Reed, Tiffany L, DO  ROCKLATAN 0.02-0.005 % SOLN  08/21/18   [provider]  zolpidem (AMBIEN) 5 MG tablet TAKE ONE TABLET BY MOUTH DAILY AS NEEDED FOR SLEEP 10/03/18   Reed, Tiffany L, DO  Zoster Vaccine Adjuvanted [facility]) injection Inject 0.5 mLs into the muscle once.    [provider]    Family History Family History  Problem Relation Age of Onset   Cancer Mother        lymphoma   Cancer Father        pancreatic   Heart attack Neg Hx    Stroke Neg Hx     Social History Social History    Tobacco Use   Smoking status: Never Smoker   Smokeless tobacco: Never Used  Substance Use Topics   Alcohol use: Yes    Alcohol/week: 0.0 standard drinks    Comment: rare   Drug use: No     Allergies   Metoprolol; Citalopram; Trazodone and nefazodone; and Zoloft [sertraline hcl]   Review of Systems Review of Systems  Constitutional: Negative for diaphoresis and fever.  Respiratory: Negative for shortness of breath.   Cardiovascular: Negative for chest pain.  Gastrointestinal: Negative for abdominal pain, nausea and vomiting.  Musculoskeletal: Positive for arthralgias. Negative for back pain and neck pain.  Skin: Positive for wound.  Neurological: Negative for dizziness, syncope, weakness, light-headedness, numbness and headaches.  All other systems reviewed and are negative.    Physical Exam Updated Vital Signs BP (!) 130/98 (BP Location: Left Arm)    Pulse 99    Temp 97.8 F (36.6 C) (Oral)    Resp (!) 22    SpO2 100%   Physical Exam Vitals signs and nursing note reviewed.  Constitutional:      General: He is not in acute distress.    Appearance: He is well-developed. He is not diaphoretic.  HENT:     Head: Normocephalic and atraumatic.     Comments: No noted tenderness, swelling, wounds, deformity, or instability to the scalp or face.    Nose: Nose normal.     Mouth/Throat:     Mouth: Mucous membranes are moist.     Pharynx: Oropharynx is clear.  Eyes:     Extraocular Movements: Extraocular movements intact.     Conjunctiva/sclera: Conjunctivae normal.     Pupils: Pupils are equal, round, and reactive to light.  Neck:     Musculoskeletal: Neck supple.  Cardiovascular:     Rate and Rhythm: Normal rate and regular rhythm.     Pulses: Normal pulses.          Radial pulses are 2+ on the right side and 2+ on the left side.       Posterior tibial pulses are 2+ on the right side and 2+ on the left side.     Heart sounds: Normal heart sounds.     Comments:  Tactile temperature in the extremities appropriate and equal bilaterally. Pulmonary:  Effort: Pulmonary effort is normal. No respiratory distress.     Breath sounds: Normal breath sounds.  Chest:     Chest wall: No tenderness.  Abdominal:     Palpations: Abdomen is soft.     Tenderness: There is no abdominal tenderness. There is no guarding.  Musculoskeletal:        General: Tenderness present. No deformity.     Right lower leg: Edema present.     Left lower leg: Edema present.     Comments: Patient has elastic knee sleeve in place upon arrival.  Tenderness to the left anterior medial knee with no noted swelling, deformity, or instability.  No noted laxity.  Some crepitus with range of motion.  Denies significant pain with range of motion.  Patient indicates he has pain to the left anterior hip, but no tenderness, swelling, noted deformity, or instability.  No pain, swelling, tenderness, deformity, or instability noted to the right lower extremity.  Full range of motion in the shoulders, elbows, and wrists without noted pain or difficulty.  No deformity, swelling, or instability to the upper extremities.  Normal motor function intact in all extremities. No midline spinal tenderness.   Pitting edema to the bilateral lower extremities, but patient states this is not unusual for him.  Lymphadenopathy:     Cervical: No cervical adenopathy.  Skin:    General: Skin is warm and dry.     Comments: 2 skin tears measuring approximately 3 cm in diameter to the left proximal forearm and distal humeral region close to the elbow, but not overlying the joint.  Neurological:     Mental Status: He is alert.     Comments: Sensation to light touch grossly intact in each of the extremities. Patient accurately follows commands and answers questions. Motor function intact in each of the extremities.  Psychiatric:        Mood and Affect: Mood and affect normal.        Speech: Speech normal.         Behavior: Behavior normal.      ED Treatments / Results  Labs (all labs ordered are listed, but only abnormal results are displayed) Labs Reviewed  BASIC METABOLIC PANEL - Abnormal; Notable for the following components:      Result Value   Potassium 3.4 (*)    Glucose, Bld 111 (*)    BUN 27 (*)    Creatinine, Ser 1.50 (*)    Calcium 8.8 (*)    GFR calc non Af Amer 40 (*)    GFR calc Af Amer 47 (*)    All other components within normal limits  CBC WITH DIFFERENTIAL/PLATELET - Abnormal; Notable for the following components:   MCV 101.0 (*)    All other components within normal limits  PROTIME-INR - Abnormal; Notable for the following components:   Prothrombin Time 16.6 (*)    INR 1.4 (*)    All other components within normal limits  APTT - Abnormal; Notable for the following components:   aPTT 39 (*)    All other components within normal limits    EKG EKG Interpretation  Date/Time:  Wednesday November 16 2018 22:55:57 EDT Ventricular Rate:  89 PR Interval:    QRS Duration: 189 QT Interval:  504 QTC Calculation: 614 R Axis:   98 Text Interpretation:  Atrial fibrillation Ventricular premature complex Right bundle branch block Repol abnrm suggests ischemia, anterolateral Baseline wander in lead(s) II Confirmed by Addison Lank ([id number]) on 11/16/2018 11:27:28  PM   Radiology Dg Chest 1 View  Result Date: 11/17/2018 CLINICAL DATA:  Initial evaluation for acute hip fracture, preoperative evaluation. EXAM: CHEST  1 VIEW COMPARISON:  Prior radiograph from 08/10/2017. FINDINGS: Advanced cardiomegaly, stable. Mediastinal silhouette within normal limits. Aortic atherosclerosis. Lungs mildly hypoinflated. Mild diffuse pulmonary interstitial congestion without frank alveolar edema. Suspected small bilateral pleural effusions. No consolidative opacity. No pneumothorax. No acute osseous finding. IMPRESSION: 1. Cardiomegaly with mild diffuse pulmonary interstitial congestion and small bilateral  pleural effusions. 2. Aortic atherosclerosis. Electronically Signed   By: Jeannine Boga M.D.   On: 11/17/2018 01:20   Dg Elbow Complete Left  Result Date: 11/17/2018 CLINICAL DATA:  Initial evaluation for acute trauma, fall. EXAM: LEFT ELBOW - COMPLETE 3+ VIEW COMPARISON:  None. FINDINGS: No acute fracture dislocation. No joint effusion. Radial head intact. Mild degenerative spurring at the medial and lateral epicondyles noted. No acute soft tissue abnormality. IMPRESSION: No acute osseous abnormality about the left elbow. Electronically Signed   By: Jeannine Boga M.D.   On: 11/17/2018 00:48   Ct Head Wo Contrast  Result Date: 11/17/2018 CLINICAL DATA:  Initial evaluation for acute trauma, fall. EXAM: CT HEAD WITHOUT CONTRAST TECHNIQUE: Contiguous axial images were obtained from the base of the skull through the vertex without intravenous contrast. COMPARISON:  Prior MRI from 08/12/2017. FINDINGS: Brain: Moderately advanced age-related cerebral atrophy with chronic small vessel ischemic disease. Superimposed small remote left cerebellar infarct. No acute intracranial hemorrhage. No acute large vessel territory infarct. No mass lesion, midline shift or mass effect. No hydrocephalus. No extra-axial fluid collection. Vascular: No hyperdense vessel. Calcified atherosclerosis present at the skull base. Skull: Scalp soft tissues within normal limits.  Calvarium intact. Sinuses/Orbits: Globes and orbital soft tissues demonstrate no acute finding. Mild mucosal thickening within the ethmoidal air cells and maxillary sinuses. Paranasal sinuses are otherwise clear. Trace bilateral mastoid effusions, of doubtful significance. Other: None. IMPRESSION: 1. No acute intracranial abnormality. 2. Moderately advanced age-related cerebral atrophy with chronic microvascular ischemic disease. Electronically Signed   By: Jeannine Boga M.D.   On: 11/17/2018 01:17   Dg Knee Complete 4 Views Left  Result Date:  11/17/2018 CLINICAL DATA:  Initial evaluation for acute trauma, fall. EXAM: LEFT KNEE - COMPLETE 4+ VIEW COMPARISON:  None. FINDINGS: No acute fracture dislocation. No joint effusion. Advanced osteoarthritic changes about the knee. No acute soft tissue abnormality. Prominent atherosclerotic change noted. IMPRESSION: 1. No acute osseous abnormality about the left knee. 2. Advanced degenerative osteoarthrosis. Electronically Signed   By: Jeannine Boga M.D.   On: 11/17/2018 00:46   Dg Hip Unilat W Or Wo Pelvis 2-3 Views Left  Result Date: 11/17/2018 CLINICAL DATA:  Initial evaluation for acute trauma, fall. EXAM: DG HIP (WITH OR WITHOUT PELVIS) 2-3V LEFT COMPARISON:  None. FINDINGS: There is an acute minimally displaced and impacted subcapital fracture of the left femoral neck. Left femoral head remains normally position within the acetabulum. Femoral head height maintained. Bony pelvis intact. Limited views of the right hip demonstrate no acute finding. No acute soft tissue abnormality. Prominent atherosclerosis noted within the proximal thighs. Vascular clips overlie the proximal right thigh. IMPRESSION: Acute mildly displaced subcapital fracture of the left femoral neck. Electronically Signed   By: Jeannine Boga M.D.   On: 11/17/2018 00:45    Procedures Procedures (including critical care time)  Medications Ordered in ED Medications  bacitracin ointment (1 application Topical Given 11/17/18 0137)  fentaNYL (SUBLIMAZE) injection 75 mcg (75 mcg Intravenous Given 11/17/18  0137)     Initial Impression / Assessment and Plan / ED Course  I have reviewed the triage vital signs and the nursing notes.  Pertinent labs & imaging results that were available during my care of the patient were reviewed by me and considered in my medical decision making (see chart for details).  Clinical Course as of Nov 17 227  Thu Nov 17, 2018  0122 Spoke with Dr. Marlou Sa, orthopedics.  Admit via Medicine service.  Hold Eliquis. He will see the patient later today.   [SJ]  0134 Patient has lower extremity edema that he states is chronic.  He does not complain of any shortness of breath or chest pain.  No increased work of breathing.  SPO2 is adequate on room air.  No rales on exam.  DG Chest 1 View [SJ]  0153 Spoke with hospitalist. Agrees to admit the patient.   [SJ]    Clinical Course User Index [SJ] Emmalene Kattner C, PA-C       Patient presents following a mechanical fall.  Subcapital fracture of the left femoral neck noted on x-ray.  No signs of neurovascular compromise.  Orthopedics to manage this. Chest x-ray discussed above.  Some worsening in patient's creatinine and BUN.  Admitted via hospitalist, who will manage these findings further.  Labs and radiological studies were personally reviewed by me.  Findings and plan of care discussed with Addison Lank, MD. Dr. Leonette Monarch personally evaluated and examined this patient.   Final Clinical Impressions(s) / ED Diagnoses   Final diagnoses:  Closed fracture of neck of left femur, initial encounter [facility])  Fall, initial encounter    ED Discharge Orders    None       Layla Maw 11/17/18 0229    Fatima Blank, MD 11/19/18 [phone]

## 2018-11-16 NOTE — ED Triage Notes (Signed)
BIB [facility] from [facility] with c/o of unwitnessed fall. Per EMS pt reports pain to back of head, left hip and left knee.

## 2018-11-17 ENCOUNTER — Emergency Department (HOSPITAL_COMMUNITY): Payer: Medicare Other

## 2018-11-17 DIAGNOSIS — I420 Dilated cardiomyopathy: Secondary | ICD-10-CM | POA: Diagnosis not present

## 2018-11-17 DIAGNOSIS — E162 Hypoglycemia, unspecified: Secondary | ICD-10-CM | POA: Diagnosis not present

## 2018-11-17 DIAGNOSIS — I2510 Atherosclerotic heart disease of native coronary artery without angina pectoris: Secondary | ICD-10-CM | POA: Diagnosis not present

## 2018-11-17 DIAGNOSIS — R0902 Hypoxemia: Secondary | ICD-10-CM | POA: Diagnosis not present

## 2018-11-17 DIAGNOSIS — G92 Toxic encephalopathy: Secondary | ICD-10-CM | POA: Diagnosis not present

## 2018-11-17 DIAGNOSIS — Y92129 Unspecified place in nursing home as the place of occurrence of the external cause: Secondary | ICD-10-CM | POA: Diagnosis not present

## 2018-11-17 DIAGNOSIS — N179 Acute kidney failure, unspecified: Secondary | ICD-10-CM

## 2018-11-17 DIAGNOSIS — I480 Paroxysmal atrial fibrillation: Secondary | ICD-10-CM

## 2018-11-17 DIAGNOSIS — Z930 Tracheostomy status: Secondary | ICD-10-CM | POA: Diagnosis not present

## 2018-11-17 DIAGNOSIS — I5043 Acute on chronic combined systolic (congestive) and diastolic (congestive) heart failure: Secondary | ICD-10-CM | POA: Diagnosis not present

## 2018-11-17 DIAGNOSIS — I5022 Chronic systolic (congestive) heart failure: Secondary | ICD-10-CM | POA: Diagnosis not present

## 2018-11-17 DIAGNOSIS — S59902A Unspecified injury of left elbow, initial encounter: Secondary | ICD-10-CM | POA: Diagnosis not present

## 2018-11-17 DIAGNOSIS — I130 Hypertensive heart and chronic kidney disease with heart failure and stage 1 through stage 4 chronic kidney disease, or unspecified chronic kidney disease: Secondary | ICD-10-CM | POA: Diagnosis not present

## 2018-11-17 DIAGNOSIS — M2550 Pain in unspecified joint: Secondary | ICD-10-CM | POA: Diagnosis not present

## 2018-11-17 DIAGNOSIS — I10 Essential (primary) hypertension: Secondary | ICD-10-CM | POA: Diagnosis not present

## 2018-11-17 DIAGNOSIS — Z79899 Other long term (current) drug therapy: Secondary | ICD-10-CM | POA: Diagnosis not present

## 2018-11-17 DIAGNOSIS — Z7901 Long term (current) use of anticoagulants: Secondary | ICD-10-CM | POA: Diagnosis not present

## 2018-11-17 DIAGNOSIS — R278 Other lack of coordination: Secondary | ICD-10-CM | POA: Diagnosis not present

## 2018-11-17 DIAGNOSIS — R2689 Other abnormalities of gait and mobility: Secondary | ICD-10-CM | POA: Diagnosis not present

## 2018-11-17 DIAGNOSIS — J90 Pleural effusion, not elsewhere classified: Secondary | ICD-10-CM | POA: Diagnosis not present

## 2018-11-17 DIAGNOSIS — R2681 Unsteadiness on feet: Secondary | ICD-10-CM | POA: Diagnosis not present

## 2018-11-17 DIAGNOSIS — Z96651 Presence of right artificial knee joint: Secondary | ICD-10-CM | POA: Diagnosis present

## 2018-11-17 DIAGNOSIS — R498 Other voice and resonance disorders: Secondary | ICD-10-CM | POA: Diagnosis not present

## 2018-11-17 DIAGNOSIS — T424X5A Adverse effect of benzodiazepines, initial encounter: Secondary | ICD-10-CM | POA: Diagnosis not present

## 2018-11-17 DIAGNOSIS — Z471 Aftercare following joint replacement surgery: Secondary | ICD-10-CM | POA: Diagnosis not present

## 2018-11-17 DIAGNOSIS — S72002A Fracture of unspecified part of neck of left femur, initial encounter for closed fracture: Secondary | ICD-10-CM | POA: Diagnosis present

## 2018-11-17 DIAGNOSIS — S72012A Unspecified intracapsular fracture of left femur, initial encounter for closed fracture: Secondary | ICD-10-CM | POA: Diagnosis not present

## 2018-11-17 DIAGNOSIS — D696 Thrombocytopenia, unspecified: Secondary | ICD-10-CM | POA: Diagnosis present

## 2018-11-17 DIAGNOSIS — Z9049 Acquired absence of other specified parts of digestive tract: Secondary | ICD-10-CM | POA: Diagnosis not present

## 2018-11-17 DIAGNOSIS — Z01811 Encounter for preprocedural respiratory examination: Secondary | ICD-10-CM | POA: Diagnosis present

## 2018-11-17 DIAGNOSIS — Z01818 Encounter for other preprocedural examination: Secondary | ICD-10-CM | POA: Diagnosis not present

## 2018-11-17 DIAGNOSIS — K5900 Constipation, unspecified: Secondary | ICD-10-CM | POA: Diagnosis not present

## 2018-11-17 DIAGNOSIS — W19XXXA Unspecified fall, initial encounter: Secondary | ICD-10-CM

## 2018-11-17 DIAGNOSIS — Z8673 Personal history of transient ischemic attack (TIA), and cerebral infarction without residual deficits: Secondary | ICD-10-CM | POA: Diagnosis not present

## 2018-11-17 DIAGNOSIS — E876 Hypokalemia: Secondary | ICD-10-CM | POA: Diagnosis present

## 2018-11-17 DIAGNOSIS — I6782 Cerebral ischemia: Secondary | ICD-10-CM | POA: Diagnosis not present

## 2018-11-17 DIAGNOSIS — I5023 Acute on chronic systolic (congestive) heart failure: Secondary | ICD-10-CM | POA: Diagnosis not present

## 2018-11-17 DIAGNOSIS — S72042A Displaced fracture of base of neck of left femur, initial encounter for closed fracture: Secondary | ICD-10-CM | POA: Diagnosis not present

## 2018-11-17 DIAGNOSIS — N183 Chronic kidney disease, stage 3 (moderate): Secondary | ICD-10-CM | POA: Diagnosis present

## 2018-11-17 DIAGNOSIS — Z8521 Personal history of malignant neoplasm of larynx: Secondary | ICD-10-CM | POA: Diagnosis not present

## 2018-11-17 DIAGNOSIS — Z01810 Encounter for preprocedural cardiovascular examination: Secondary | ICD-10-CM

## 2018-11-17 DIAGNOSIS — E785 Hyperlipidemia, unspecified: Secondary | ICD-10-CM | POA: Diagnosis not present

## 2018-11-17 DIAGNOSIS — R1312 Dysphagia, oropharyngeal phase: Secondary | ICD-10-CM | POA: Diagnosis not present

## 2018-11-17 DIAGNOSIS — M6281 Muscle weakness (generalized): Secondary | ICD-10-CM | POA: Diagnosis not present

## 2018-11-17 DIAGNOSIS — Z96642 Presence of left artificial hip joint: Secondary | ICD-10-CM | POA: Diagnosis not present

## 2018-11-17 DIAGNOSIS — W010XXA Fall on same level from slipping, tripping and stumbling without subsequent striking against object, initial encounter: Secondary | ICD-10-CM | POA: Diagnosis present

## 2018-11-17 DIAGNOSIS — Z85038 Personal history of other malignant neoplasm of large intestine: Secondary | ICD-10-CM | POA: Diagnosis not present

## 2018-11-17 DIAGNOSIS — Z9181 History of falling: Secondary | ICD-10-CM | POA: Diagnosis not present

## 2018-11-17 DIAGNOSIS — R5381 Other malaise: Secondary | ICD-10-CM | POA: Diagnosis not present

## 2018-11-17 DIAGNOSIS — Z7401 Bed confinement status: Secondary | ICD-10-CM | POA: Diagnosis not present

## 2018-11-17 DIAGNOSIS — M1712 Unilateral primary osteoarthritis, left knee: Secondary | ICD-10-CM | POA: Diagnosis not present

## 2018-11-17 DIAGNOSIS — Z8546 Personal history of malignant neoplasm of prostate: Secondary | ICD-10-CM | POA: Diagnosis not present

## 2018-11-17 LAB — BASIC METABOLIC PANEL
Anion gap: 11 (ref 5–15)
Anion gap: 15 (ref 5–15)
BUN: 27 mg/dL — ABNORMAL HIGH (ref 8–23)
BUN: 29 mg/dL — ABNORMAL HIGH (ref 8–23)
CO2: 23 mmol/L (ref 22–32)
CO2: 19 mmol/L — ABNORMAL LOW (ref 22–32)
Calcium: 8.8 mg/dL — ABNORMAL LOW (ref 8.9–10.3)
Calcium: 8.7 mg/dL — ABNORMAL LOW (ref 8.9–10.3)
Chloride: 107 mmol/L (ref 98–111)
Chloride: 108 mmol/L (ref 98–111)
Creatinine, Ser: 1.5 mg/dL — ABNORMAL HIGH (ref 0.61–1.24)
Creatinine, Ser: 1.58 mg/dL — ABNORMAL HIGH (ref 0.61–1.24)
GFR calc Af Amer: 47 mL/min — ABNORMAL LOW (ref >60)
GFR calc Af Amer: 44 mL/min — ABNORMAL LOW (ref >60)
GFR calc non Af Amer: 40 mL/min — ABNORMAL LOW (ref >60)
GFR calc non Af Amer: 38 mL/min — ABNORMAL LOW (ref >60)
Glucose, Bld: 111 mg/dL — ABNORMAL HIGH (ref 70–99)
Glucose, Bld: 115 mg/dL — ABNORMAL HIGH (ref 70–99)
Potassium: 3.4 mmol/L — ABNORMAL LOW (ref 3.5–5.1)
Potassium: 3.3 mmol/L — ABNORMAL LOW (ref 3.5–5.1)
Sodium: 141 mmol/L (ref 135–145)
Sodium: 142 mmol/L (ref 135–145)

## 2018-11-17 LAB — CBC WITH DIFFERENTIAL/PLATELET
Abs Immature Granulocytes: 0.01 10*3/uL (ref 0.00–0.07)
Basophils Absolute: 0 10*3/uL (ref 0.0–0.1)
Basophils Relative: 1 %
Eosinophils Absolute: 0.1 10*3/uL (ref 0.0–0.5)
Eosinophils Relative: 2 %
HCT: 49 % (ref 39.0–52.0)
Hemoglobin: 15.4 g/dL (ref 13.0–17.0)
Immature Granulocytes: 0 %
Lymphocytes Relative: 15 %
Lymphs Abs: 1.1 10*3/uL (ref 0.7–4.0)
MCH: 31.8 pg (ref 26.0–34.0)
MCHC: 31.4 g/dL (ref 30.0–36.0)
MCV: 101 fL — ABNORMAL HIGH (ref 80.0–100.0)
Monocytes Absolute: 0.5 10*3/uL (ref 0.1–1.0)
Monocytes Relative: 7 %
Neutro Abs: 5.6 10*3/uL (ref 1.7–7.7)
Neutrophils Relative %: 75 %
Platelets: 150 10*3/uL (ref 150–400)
RBC: 4.85 MIL/uL (ref 4.22–5.81)
RDW: 14 % (ref 11.5–15.5)
WBC: 7.4 10*3/uL (ref 4.0–10.5)
nRBC: 0 % (ref 0.0–0.2)

## 2018-11-17 LAB — MAGNESIUM: Magnesium: 2.2 mg/dL (ref 1.7–2.4)

## 2018-11-17 LAB — APTT: aPTT: 39 seconds — ABNORMAL HIGH (ref 24–36)

## 2018-11-17 LAB — TROPONIN I: Troponin I: 0.03 ng/mL (ref <0.03)

## 2018-11-17 LAB — SURGICAL PCR SCREEN
MRSA, PCR: NEGATIVE
Staphylococcus aureus: NEGATIVE

## 2018-11-17 LAB — PROTIME-INR
INR: 1.4 — ABNORMAL HIGH (ref 0.8–1.2)
Prothrombin Time: 16.6 seconds — ABNORMAL HIGH (ref 11.4–15.2)

## 2018-11-17 LAB — BRAIN NATRIURETIC PEPTIDE: B Natriuretic Peptide: 3122.2 pg/mL — ABNORMAL HIGH (ref 0.0–100.0)

## 2018-11-17 MED ORDER — LORAZEPAM 2 MG/ML IJ SOLN
0.5000 mg | Freq: Two times a day (BID) | INTRAMUSCULAR | Status: DC | PRN
  Administered 2018-11-17 – 2018-11-20 (×2): 0.5 mg via INTRAVENOUS
  Filled 2018-11-17 (×2): qty 1

## 2018-11-17 MED ORDER — HYDROCODONE-ACETAMINOPHEN 5-325 MG PO TABS: 1.0000 | ORAL_TABLET | Freq: Four times a day (QID) | ORAL | Status: DC | PRN

## 2018-11-17 MED ORDER — FUROSEMIDE 10 MG/ML IJ SOLN
40.0000 mg | Freq: Two times a day (BID) | INTRAMUSCULAR | Status: DC
  Administered 2018-11-17 – 2018-11-18 (×3): 40 mg via INTRAVENOUS
  Filled 2018-11-17 (×3): qty 4

## 2018-11-17 MED ORDER — POTASSIUM CHLORIDE CRYS ER 20 MEQ PO TBCR
40.0000 meq | EXTENDED_RELEASE_TABLET | Freq: Once | ORAL | Status: AC
  Administered 2018-11-17: 40 meq via ORAL
  Filled 2018-11-17: qty 2

## 2018-11-17 MED ORDER — POVIDONE-IODINE 10 % EX SWAB
2.0000 "application " | Freq: Once | CUTANEOUS | Status: DC
  Administered 2018-11-18: 2 via TOPICAL

## 2018-11-17 MED ORDER — POLYETHYLENE GLYCOL 3350 17 G PO PACK
17.0000 g | PACK | Freq: Every day | ORAL | Status: DC
  Administered 2018-11-17 – 2018-11-23 (×6): 17 g via ORAL
  Filled 2018-11-17 (×6): qty 1

## 2018-11-17 MED ORDER — SENNOSIDES-DOCUSATE SODIUM 8.6-50 MG PO TABS: 1.0000 | ORAL_TABLET | Freq: Every evening | ORAL | Status: DC | PRN

## 2018-11-17 MED ORDER — FENTANYL CITRATE (PF) 100 MCG/2ML IJ SOLN
75.0000 ug | Freq: Once | INTRAMUSCULAR | Status: AC
  Administered 2018-11-17: 02:00:00 75 ug via INTRAVENOUS
  Filled 2018-11-17: qty 2

## 2018-11-17 MED ORDER — DOCUSATE SODIUM 100 MG PO CAPS
100.0000 mg | ORAL_CAPSULE | Freq: Two times a day (BID) | ORAL | Status: DC
  Administered 2018-11-17: 09:00:00 100 mg via ORAL
  Filled 2018-11-17 (×2): qty 1

## 2018-11-17 MED ORDER — TRANEXAMIC ACID-NACL 1000-0.7 MG/100ML-% IV SOLN
1000.0000 mg | INTRAVENOUS | Status: AC
  Administered 2018-11-18: 08:00:00 1000 mg via INTRAVENOUS
  Filled 2018-11-17: qty 100

## 2018-11-17 MED ORDER — MORPHINE SULFATE (PF) 2 MG/ML IV SOLN
0.5000 mg | INTRAVENOUS | Status: DC | PRN
  Administered 2018-11-17 (×3): 0.5 mg via INTRAVENOUS
  Filled 2018-11-17 (×3): qty 1

## 2018-11-17 MED ORDER — CEFAZOLIN SODIUM-DEXTROSE 2-4 GM/100ML-% IV SOLN
2.0000 g | INTRAVENOUS | Status: AC
  Administered 2018-11-18: 2 g via INTRAVENOUS

## 2018-11-17 MED ORDER — TRANEXAMIC ACID 1000 MG/10ML IV SOLN
2000.0000 mg | INTRAVENOUS | Status: AC
  Administered 2018-11-18 (×2): 2000 mg via TOPICAL
  Filled 2018-11-17 (×3): qty 20

## 2018-11-17 NOTE — Consult Note (Signed)
Reason for Consult:Left hip fx Referring Physician: B Regalado  Timothy Suarez is an 85 y.o. male.  HPI: Timothy Suarez fell after losing his balance. History is limited as pt has a very hard time communicating with his electrolarynx. He was brought to the hospital where x-rays showed a femoral neck fx and orthopedic surgery was consulted.  Past Medical History:  Diagnosis Date  . Arthritis   . Cerebral ischemia   . CKD (chronic kidney disease), stage III ([place])   . Colon cancer [facility])     s/p partial colectomy  . Complication of anesthesia    "woke up w/confusion and hallucinations once after mitral valve OR"  . Coronary artery disease    a. pre-surgical cath 2010 showed 50-60% of the LAD with negative nuclear stress test at that time. b. normal nuc 2016.  . First degree AV block   . Glaucoma   . Hernia   . HTN (hypertension)    takes Amlodipine daily  . Hyperlipidemia   . Major depression   . Mild cognitive impairment   . Mitral regurgitation    a. mild-mod MR by echo 08/2017.  . Mobitz type 1 second degree atrioventricular block   . Mobitz type 2 second degree atrioventricular block   . Moderate aortic insufficiency   . MVP (mitral valve prolapse)    S/P Rt mini thoractomy for Mitral Valve repair  . Paroxysmal atrial fibrillation (HCC)    S/P Maze procedure  . 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 Premature atrial contractions   . Prostate cancer ([place])   . PVC's (premature ventricular contractions)   . RBBB   . 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 Sleep disturbance   . Thoracic aortic aneurysm ([place])    a. last echo 08/2017: mildly dilated aortic root.  . Thrombocytopenia ([place])   . Wandering atrial pacemaker     Past Surgical History:  Procedure Laterality Date  . CATARACT EXTRACTION W/ INTRAOCULAR LENS  IMPLANT, BILATERAL Bilateral   . COLECTOMY  2004   Dr [name]  . DIRECT LARYNGOSCOPY N/A  01/15/2016   Procedure: DIRECT LARYNGOSCOPY WITH BIOPSY AND FROZEN SECTION;  Surgeon: Izora Gala, MD;  Location: [place];  Service: ENT;  Laterality: N/A;  . FlexHD patch repair of chest wall hernia.  01/28/2011   [name]  . GASTROSTOMY W/ FEEDING TUBE    . IR GENERIC HISTORICAL  10/08/2016   IR GASTROSTOMY TUBE REMOVAL 10/08/2016 Sandi Mariscal, MD [facility]-INTERV RAD  . JOINT REPLACEMENT    . LARYNGETOMY N/A 01/15/2016   Procedure:  TOTAL LARYNGECTOMY;  Surgeon: Izora Gala, MD;  Location: [facility] OR;  Service: ENT;  Laterality: N/A;  . MAZE  12/27/2008   left side lesion set  . MICROLARYNGOSCOPY Right 01/29/2015   Procedure: MICROLARYNGOSCOPY WITH BIOSPY OF RIGHT VOCAL CORD;  Surgeon: Izora Gala, MD;  Location: [place];  Service: ENT;  Laterality: Right;  . MITRAL VALVE REPAIR  12/27/2008   complex valvuloplasty with 65mm Memo 3D annuloplasty ring via right minithoracotomy  . PECTORALIS FLAP Left 05/04/2016   Procedure: PECTORALIS FLAP to neck with possible, split thickness skin graft;  Surgeon: Loel Lofty Dillingham, DO;  Location: [place];  Service: Plastics;  Laterality: Left;  . PROSTATE BIOPSY    . SKIN SPLIT GRAFT Left 05/04/2016   Procedure: PECTORALIS MAJOR MYOCUTANEOUS FLAP RECONSTRUCTION OF PHARYNX AND SPLIT THICKNESS SKIN GRAFT;  Surgeon: Izora Gala, MD;  Location: [facility];  Service: ENT;  Laterality: Left;  . TEE WITHOUT CARDIOVERSION N/A 01/03/2013   Procedure: TRANSESOPHAGEAL ECHOCARDIOGRAM (TEE);  Surgeon: Sueanne Margarita, MD;  Location: [place];  Service: Cardiovascular;  Laterality: N/A;  . TOTAL KNEE ARTHROPLASTY Right 2003  . TRACHEAL ESOPHAGEAL PUNCTURE REPAIR N/A 03/26/2016   Procedure: TRACHEAL ESOPHAGEAL PUNCTURE;  Surgeon: Izora Gala, MD;  Location: [place];  Service: ENT;  Laterality: N/A;  . TRACHEOESOPHAGEAL FISTULA REPAIR N/A 03/26/2016   Procedure: TRACHEO-ESOPHAGEAL   PUNCTURE,FISTULAR CLOSURE;  Surgeon: Izora Gala, MD;  Location: [place];  Service: ENT;  Laterality: N/A;  . TRACHEOESOPHAGEAL  FISTULA REPAIR N/A 04/09/2016   Procedure: FISTULA REPAIR WITH  MUSCLE ROTATION FLAP;  Surgeon: Izora Gala, MD;  Location: [place];  Service: ENT;  Laterality: N/A;  . TRACHEOESOPHAGEAL FISTULA REPAIR N/A 07/27/2016   Procedure: CLOSURE OF FISTULA;  Surgeon: Izora Gala, MD;  Location: [place];  Service: ENT;  Laterality: N/A;  . TYMPANOPLASTY  1967   "? side"    Family History  Problem Relation Age of Onset  . Cancer Mother        lymphoma  . Cancer Father        pancreatic  . Heart attack Neg Hx   . Stroke Neg Hx     Social History:  reports that he has never smoked. He has never used smokeless tobacco. He reports current alcohol use. He reports that he does not use drugs.  Allergies:  Allergies  Allergen Reactions  . Metoprolol Other (See Comments)    Hypersensitive to beta blockers with associated hypotension and bradycardia  . Citalopram Nausea Only  . Trazodone And Nefazodone Other (See Comments)    Dry mouth  . Zoloft [Sertraline Hcl] Other (See Comments)    dizzy    Medications: I have reviewed the patient's current medications.  Results for orders placed or performed during the hospital encounter of 11/16/18 (from the past 48 hour(s))  Basic metabolic panel     Status: Abnormal   Collection Time: 11/17/18  1:18 AM  Result Value Ref Range   Sodium 141 135 - 145 mmol/L   Potassium 3.4 (L) 3.5 - 5.1 mmol/L   Chloride 107 98 - 111 mmol/L   CO2 23 22 - 32 mmol/L   Glucose, Bld 111 (H) 70 - 99 mg/dL   BUN 27 (H) 8 - 23 mg/dL   Creatinine, Ser 1.50 (H) 0.61 - 1.24 mg/dL   Calcium 8.8 (L) 8.9 - 10.3 mg/dL   GFR calc non Af Amer 40 (L) >60 mL/min   GFR calc Af Amer 47 (L) >60 mL/min   Anion gap 11 5 - 15    Comment: Performed at [facility], [address], [place], [place] [address]  CBC with Differential     Status: Abnormal   Collection Time: 11/17/18  1:18 AM  Result Value Ref Range   WBC 7.4 4.0 - 10.5 K/uL   RBC 4.85 4.22 - 5.81 MIL/uL   Hemoglobin 15.4  13.0 - 17.0 g/dL   HCT 49.0 39.0 - 52.0 %   MCV 101.0 (H) 80.0 - 100.0 fL   MCH 31.8 26.0 - 34.0 pg   MCHC 31.4 30.0 - 36.0 g/dL   RDW 14.0 11.5 - 15.5 %   Platelets 150 150 - 400 K/uL   nRBC 0.0 0.0 - 0.2 %   Neutrophils Relative % 75 %   Neutro Abs 5.6 1.7 - 7.7 K/uL  Lymphocytes Relative 15 %   Lymphs Abs 1.1 0.7 - 4.0 K/uL   Monocytes Relative 7 %   Monocytes Absolute 0.5 0.1 - 1.0 K/uL   Eosinophils Relative 2 %   Eosinophils Absolute 0.1 0.0 - 0.5 K/uL   Basophils Relative 1 %   Basophils Absolute 0.0 0.0 - 0.1 K/uL   Immature Granulocytes 0 %   Abs Immature Granulocytes 0.01 0.00 - 0.07 K/uL    Comment: Performed at [facility], [place] [address], [place], [place] [address]  Protime-INR     Status: Abnormal   Collection Time: 11/17/18  1:18 AM  Result Value Ref Range   Prothrombin Time 16.6 (H) 11.4 - 15.2 seconds   INR 1.4 (H) 0.8 - 1.2    Comment: (NOTE) INR goal varies based on device and disease states. Performed at [facility], [place] [address] [place], [place] [address]   APTT     Status: Abnormal   Collection Time: 11/17/18  1:18 AM  Result Value Ref Range   aPTT 39 (H) 24 - 36 seconds    Comment:        IF BASELINE aPTT IS ELEVATED, SUGGEST PATIENT RISK ASSESSMENT BE USED TO DETERMINE APPROPRIATE ANTICOAGULANT THERAPY. Performed at [facility], [place] [address], [place], [place] [address]   Brain natriuretic peptide     Status: Abnormal   Collection Time: 11/17/18  1:18 AM  Result Value Ref Range   B Natriuretic Peptide 3,122.2 (H) 0.0 - 100.0 pg/mL    Comment: Performed at [facility] [address] [place], [place] [address]  Surgical pcr screen     Status: None   Collection Time: 11/17/18  3:09 AM  Result Value Ref Range   MRSA, PCR NEGATIVE NEGATIVE   Staphylococcus aureus NEGATIVE NEGATIVE    Comment: (NOTE) The Xpert SA Assay (FDA approved for NASAL specimens in patients 83 years of age and older), is one component of  a comprehensive surveillance program. It is not intended to diagnose infection nor to guide or monitor treatment. Performed at [facility], [place] [address], [place], [place] [address]   Troponin I - ONCE - STAT     Status: Abnormal   Collection Time: 11/17/18  3:29 AM  Result Value Ref Range   Troponin I 0.03 (HH) <0.03 ng/mL    Comment: CRITICAL RESULT CALLED TO, READ BACK BY AND VERIFIED WITH: Jenetta Loges D,RN 11/17/18 0439 WAYK Performed at [facility], [place] [address] [place], [place] [address]   Basic metabolic panel     Status: Abnormal   Collection Time: 11/17/18  3:29 AM  Result Value Ref Range   Sodium 142 135 - 145 mmol/L   Potassium 3.3 (L) 3.5 - 5.1 mmol/L   Chloride 108 98 - 111 mmol/L   CO2 19 (L) 22 - 32 mmol/L   Glucose, Bld 115 (H) 70 - 99 mg/dL   BUN 29 (H) 8 - 23 mg/dL   Creatinine, Ser 1.58 (H) 0.61 - 1.24 mg/dL   Calcium 8.7 (L) 8.9 - 10.3 mg/dL   GFR calc non Af Amer 38 (L) >60 mL/min   GFR calc Af Amer 44 (L) >60 mL/min   Anion gap 15 5 - 15    Comment: Performed at [facility] [address], [place],  [address]    Dg Chest 1 View  Result Date: 11/17/2018 CLINICAL DATA:  Initial evaluation for acute hip  fracture, preoperative evaluation. EXAM: CHEST  1 VIEW COMPARISON:  Prior radiograph from 08/10/2017. FINDINGS: Advanced cardiomegaly, stable. Mediastinal silhouette within normal limits. Aortic atherosclerosis. Lungs mildly hypoinflated. Mild diffuse pulmonary interstitial congestion without frank alveolar edema. Suspected small bilateral pleural effusions. No consolidative opacity. No pneumothorax. No acute osseous finding. IMPRESSION: 1. Cardiomegaly with mild diffuse pulmonary interstitial congestion and small bilateral pleural effusions. 2. Aortic atherosclerosis. Electronically Signed   By: Jeannine Boga M.D.   On: 11/17/2018 01:20   Dg Elbow Complete Left  Result Date: 11/17/2018 CLINICAL DATA:  Initial evaluation for  acute trauma, fall. EXAM: LEFT ELBOW - COMPLETE 3+ VIEW COMPARISON:  None. FINDINGS: No acute fracture dislocation. No joint effusion. Radial head intact. Mild degenerative spurring at the medial and lateral epicondyles noted. No acute soft tissue abnormality. IMPRESSION: No acute osseous abnormality about the left elbow. Electronically Signed   By: Jeannine Boga M.D.   On: 11/17/2018 00:48   Ct Head Wo Contrast  Result Date: 11/17/2018 CLINICAL DATA:  Initial evaluation for acute trauma, fall. EXAM: CT HEAD WITHOUT CONTRAST TECHNIQUE: Contiguous axial images were obtained from the base of the skull through the vertex without intravenous contrast. COMPARISON:  Prior MRI from 08/12/2017. FINDINGS: Brain: Moderately advanced age-related cerebral atrophy with chronic small vessel ischemic disease. Superimposed small remote left cerebellar infarct. No acute intracranial hemorrhage. No acute large vessel territory infarct. No mass lesion, midline shift or mass effect. No hydrocephalus. No extra-axial fluid collection. Vascular: No hyperdense vessel. Calcified atherosclerosis present at the skull base. Skull: Scalp soft tissues within normal limits.  Calvarium intact. Sinuses/Orbits: Globes and orbital soft tissues demonstrate no acute finding. Mild mucosal thickening within the ethmoidal air cells and maxillary sinuses. Paranasal sinuses are otherwise clear. Trace bilateral mastoid effusions, of doubtful significance. Other: None. IMPRESSION: 1. No acute intracranial abnormality. 2. Moderately advanced age-related cerebral atrophy with chronic microvascular ischemic disease. Electronically Signed   By: Jeannine Boga M.D.   On: 11/17/2018 01:17   Dg Knee Complete 4 Views Left  Result Date: 11/17/2018 CLINICAL DATA:  Initial evaluation for acute trauma, fall. EXAM: LEFT KNEE - COMPLETE 4+ VIEW COMPARISON:  None. FINDINGS: No acute fracture dislocation. No joint effusion. Advanced osteoarthritic changes  about the knee. No acute soft tissue abnormality. Prominent atherosclerotic change noted. IMPRESSION: 1. No acute osseous abnormality about the left knee. 2. Advanced degenerative osteoarthrosis. Electronically Signed   By: Jeannine Boga M.D.   On: 11/17/2018 00:46   Dg Hip Unilat W Or Wo Pelvis 2-3 Views Left  Result Date: 11/17/2018 CLINICAL DATA:  Initial evaluation for acute trauma, fall. EXAM: DG HIP (WITH OR WITHOUT PELVIS) 2-3V LEFT COMPARISON:  None. FINDINGS: There is an acute minimally displaced and impacted subcapital fracture of the left femoral neck. Left femoral head remains normally position within the acetabulum. Femoral head height maintained. Bony pelvis intact. Limited views of the right hip demonstrate no acute finding. No acute soft tissue abnormality. Prominent atherosclerosis noted within the proximal thighs. Vascular clips overlie the proximal right thigh. IMPRESSION: Acute mildly displaced subcapital fracture of the left femoral neck. Electronically Signed   By: Jeannine Boga M.D.   On: 11/17/2018 00:45    Review of Systems  Constitutional: Negative for weight loss.  HENT: Negative for ear discharge, ear pain, hearing loss and tinnitus.   Eyes: Negative for blurred vision, double vision, photophobia and pain.  Respiratory: Negative for cough, sputum production and shortness of breath.   Cardiovascular: Negative for chest pain.  Gastrointestinal: Negative for abdominal pain, nausea and vomiting.  Genitourinary: Negative for dysuria, flank pain, frequency and urgency.  Musculoskeletal: Positive for joint pain (Left hip). Negative for back pain, falls, myalgias and neck pain.  Neurological: Negative for dizziness, tingling, sensory change, focal weakness, loss of consciousness and headaches.  Endo/Heme/Allergies: Does not bruise/bleed easily.  Psychiatric/Behavioral: Negative for depression, memory loss and substance abuse. The patient is not nervous/anxious.     Blood pressure 140/79, pulse 84, temperature 97.9 F (36.6 C), temperature source Axillary, resp. rate 19, SpO2 97 %. Physical Exam  Constitutional: He appears well-developed and well-nourished. No distress.  HENT:  Head: Normocephalic and atraumatic.  Eyes: Conjunctivae are normal. Right eye exhibits no discharge. Left eye exhibits no discharge. No scleral icterus.  Neck: Normal range of motion.  Cardiovascular: Normal rate and regular rhythm.  Respiratory: Effort normal. No respiratory distress.  Musculoskeletal:     Comments: LLE No traumatic wounds, ecchymosis, or rash  Mod TTP hip  No knee or ankle effusion  Knee stable to varus/ valgus and anterior/posterior stress  Sens DPN, SPN, TN intact  Motor EHL, ext, flex, evers 5/5  DP 0, PT 0, 4+ pitting edema  Neurological: He is alert.  Skin: Skin is warm and dry. He is not diaphoretic.  Psychiatric: He has a normal mood and affect. His behavior is normal.    Assessment/Plan: Left femoral neck fx -- Plan hip hemi Friday by Dr. Erlinda Hong. NPO after MN tomorrow. Multiple medical problems including CKD stage III, nonobstructive CAD, chronic systolic CHF with dilated cardiomyopathy (EF 20 to 25%), second-degree AV block type I and II, mitral valve prolapse with severe mitral regurgitation status post repair, paroxysmal atrial fibrillation on Eliquis, thoracic aortic aneurysm, hypertension, hyperlipidemia, mild cognitive impairment, CVA, colon cancer, vocal cord cancer status post laryngectomy -- Please hold Eliquis. Cardiology to see to provide cardiac clearance. Appears to need diuresis, IM to manage that. I spoke with daughter [name]) regarding plans.    Lisette Abu, PA-C Orthopedic Surgery [phone] 11/17/2018, 9:54 AM

## 2018-11-17 NOTE — Progress Notes (Signed)
I am aware of patient and will see the patient for a full consult today.   Agree with cardiology consult for management and periop risk assessment. Hold eliquis.  Tentative plan is for surgery Friday morning.  Azucena Cecil, MD [place] 8:53 AM

## 2018-11-17 NOTE — Anesthesia Preprocedure Evaluation (Addendum)
Anesthesia Evaluation  Patient identified by MRN, date of birth, ID band Patient awake    Reviewed: Allergy & Precautions, NPO status , Patient's Chart, lab work & pertinent test results  History of Anesthesia Complications Negative for: history of anesthetic complications  Airway Mallampati: Trach  TM Distance: >3 FB Neck ROM: Full    Dental   N/A:   Pulmonary  S/p laryngectomy for pharyngocutaneous fistula/TEF   breath sounds clear to auscultation       Cardiovascular hypertension, Pt. on medications + CAD and + Peripheral Vascular Disease (mildly dilated aortic root)  + dysrhythmias Atrial Fibrillation + Valvular Problems/Murmurs (s/p MV repair) MVP and AI  Rhythm:Regular Rate:Normal  '19 ECHO: EF 20-25%, mod AI, mod MR   Neuro/Psych Depression Dementia negative neurological ROS     GI/Hepatic negative GI ROS, Neg liver ROS,   Endo/Other  negative endocrine ROS  Renal/GU Renal InsufficiencyRenal disease (creat 1.34)     Musculoskeletal  (+) Arthritis ,   Abdominal   Peds  Hematology Eliquis, INR 1.4, plt 150k   Anesthesia Other Findings   Reproductive/Obstetrics                            Anesthesia Physical Anesthesia Plan  ASA: III  Anesthesia Plan: General   Post-op Pain Management:    Induction: Intravenous  PONV Risk Score and Plan: 3 and Ondansetron, Dexamethasone and Treatment may vary due to age or medical condition  Airway Management Planned: Tracheostomy  Additional Equipment:   Intra-op Plan:   Post-operative Plan: Extubation in OR  Informed Consent: I have reviewed the patients History and Physical, chart, labs and discussed the procedure including the risks, benefits and alternatives for the proposed anesthesia with the patient or authorized representative who has indicated his/her understanding and acceptance.     Consent reviewed with POA  Plan  Discussed with: CRNA and Surgeon  Anesthesia Plan Comments: (Plan routine monitors, GETA via trach stoma Discussed with daughter, [name], by telephone)       Anesthesia Quick Evaluation

## 2018-11-17 NOTE — H&P (Signed)
History and Physical    Timothy Suarez LOV:[id number] DOB: 05/15/1925 DOA: 11/16/2018  PCP: Timothy Curry, DO Patient coming from: [facility]  Chief Complaint: Fall  HPI: Timothy Suarez is a 85 y.o. male with medical history significant of CKD stage III, nonobstructive CAD, chronic systolic CHF with dilated cardiomyopathy (EF 20 to 25%), second-degree AV block type I and II, mitral valve prolapse with severe mitral regurgitation status post repair, paroxysmal atrial fibrillation, thoracic aortic aneurysm, hypertension, hyperlipidemia, mild cognitive impairment, CVA, colon cancer, vocal cord cancer status post tracheostomy presenting to the hospital via EMS for evaluation of an unwitnessed fall.  Patient used a passy muir valve to communicate but it was extremely difficult to understand him due to device malfunction.  He could answer questions by nodding yes and no and writing a few words on a paper.  History very limited.  It seems patient is trying to communicate that he fell after losing balance.  Denies loss of consciousness.  Denies any chest pain prior to the fall.  Does report feeling lightheaded and a little short of breath.  No additional history could be obtained from the patient.  Review of Systems: As per HPI otherwise 10 point review of systems negative.  Past Medical History:  Diagnosis Date   Arthritis    Cerebral ischemia    CKD (chronic kidney disease), stage III (Timothy Suarez)    Colon cancer (Timothy Suarez)     s/p partial colectomy   Complication of anesthesia    "woke up w/confusion and hallucinations once after mitral valve OR"   Coronary artery disease    a. pre-surgical cath 2010 showed 50-60% of the LAD with negative nuclear stress test at that time. b. normal nuc 2016.   First degree AV block    Glaucoma    Hernia    HTN (hypertension)    takes Amlodipine daily   Hyperlipidemia    Major depression    Mild cognitive impairment    Mitral regurgitation      a. mild-mod MR by echo 08/2017.   Mobitz type 1 second degree atrioventricular block    Mobitz type 2 second degree atrioventricular block    Moderate aortic insufficiency    MVP (mitral valve prolapse)    S/P Rt mini thoractomy for Mitral Valve repair   Paroxysmal atrial fibrillation (HCC)    S/P Maze procedure   Pharyngocutaneous fistula hospitalized 02/21/2016    s/p salvage laryngectomy   Premature atrial contractions    Prostate cancer (Timothy Suarez)    PVC's (premature ventricular contractions)    RBBB    Right vocal cord cancer (Timothy Suarez)    invasive squamous cell carcinoma    Sleep disturbance    Thoracic aortic aneurysm ([place])    a. last echo 08/2017: mildly dilated aortic root.   Thrombocytopenia (Timothy Suarez)    Wandering atrial pacemaker     Past Surgical History:  Procedure Laterality Date   CATARACT EXTRACTION W/ INTRAOCULAR LENS  IMPLANT, BILATERAL Bilateral    COLECTOMY  2004   Dr Timothy Suarez   DIRECT LARYNGOSCOPY N/A 01/15/2016   Procedure: DIRECT LARYNGOSCOPY WITH BIOPSY AND FROZEN SECTION;  Surgeon: Timothy Gala, MD;  Location: [facility];  Service: ENT;  Laterality: N/A;   FlexHD patch repair of chest wall hernia.  01/28/2011   Timothy Suarez   GASTROSTOMY W/ FEEDING TUBE     IR GENERIC HISTORICAL  10/08/2016   IR GASTROSTOMY TUBE REMOVAL 10/08/2016 Timothy Mariscal, MD [facility]   JOINT REPLACEMENT  LARYNGETOMY N/A 01/15/2016   Procedure:  TOTAL LARYNGECTOMY;  Surgeon: Timothy Gala, MD;  Location: [facility];  Service: ENT;  Laterality: N/A;   MAZE  12/27/2008   left side lesion set   MICROLARYNGOSCOPY Right 01/29/2015   Procedure: MICROLARYNGOSCOPY WITH BIOSPY OF RIGHT VOCAL CORD;  Surgeon: Timothy Gala, MD;  Location: [facility];  Service: ENT;  Laterality: Right;   MITRAL VALVE REPAIR  12/27/2008   complex valvuloplasty with 23mm Memo 3D annuloplasty ring via right minithoracotomy   PECTORALIS FLAP Left 05/04/2016   Procedure: PECTORALIS FLAP to neck with possible, split thickness skin  graft;  Surgeon: Timothy Lofty Dillingham, DO;  Location: [facility];  Service: Plastics;  Laterality: Left;   PROSTATE BIOPSY     SKIN SPLIT GRAFT Left 05/04/2016   Procedure: PECTORALIS MAJOR MYOCUTANEOUS FLAP RECONSTRUCTION OF PHARYNX AND SPLIT THICKNESS SKIN GRAFT;  Surgeon: Timothy Gala, MD;  Location: [facility];  Service: ENT;  Laterality: Left;   TEE WITHOUT CARDIOVERSION N/A 01/03/2013   Procedure: TRANSESOPHAGEAL ECHOCARDIOGRAM (TEE);  Surgeon: Timothy Margarita, MD;  Location: [facility];  Service: Cardiovascular;  Laterality: N/A;   TOTAL KNEE ARTHROPLASTY Right 2003   TRACHEAL ESOPHAGEAL PUNCTURE REPAIR N/A 03/26/2016   Procedure: TRACHEAL ESOPHAGEAL PUNCTURE;  Surgeon: Timothy Gala, MD;  [facility];  Service: ENT;  Laterality: N/A;   TRACHEOESOPHAGEAL FISTULA REPAIR N/A 03/26/2016   Procedure: TRACHEO-ESOPHAGEAL   PUNCTURE,FISTULAR CLOSURE;  Surgeon: Timothy Gala, MD;  [facility];  Service: ENT;  Laterality: N/A;   TRACHEOESOPHAGEAL FISTULA REPAIR N/A 04/09/2016   Procedure: FISTULA REPAIR WITH  MUSCLE ROTATION FLAP;  Surgeon: Timothy Gala, MD;  Location: [facility];  Service: ENT;  Laterality: N/A;   TRACHEOESOPHAGEAL FISTULA REPAIR N/A 07/27/2016   Procedure: CLOSURE OF FISTULA;  Surgeon: Timothy Gala, MD;  Location: [facility];  Service: ENT;  Laterality: N/A;   TYMPANOPLASTY  1967   "? side"     reports that he has never smoked. He has never used smokeless tobacco. He reports current alcohol use. He reports that he does not use drugs.  Allergies  Allergen Reactions   Metoprolol Other (See Comments)    Hypersensitive to beta blockers with associated hypotension and bradycardia   Citalopram Nausea Only   Trazodone And Nefazodone Other (See Comments)    Dry mouth   Zoloft [Sertraline Hcl] Other (See Comments)    dizzy    Family History  Problem Relation Age of Onset   Cancer Mother        lymphoma   Cancer Father        pancreatic   Heart attack Neg Hx    Stroke Neg Hx       Prior to Admission medications   Medication Sig Start Date End Date Taking? Authorizing Provider  atorvastatin (LIPITOR) 40 MG tablet TAKE ONE TABLET BY MOUTH DAILY 09/16/18   Suarez, Tiffany L, DO  ELIQUIS 5 MG TABS tablet TAKE ONE TABLET BY MOUTH TWICE A DAY 01/14/18   Timothy Margarita, MD  furosemide (LASIX) 40 MG tablet Take 1 tablet (40 mg total) by mouth every morning AND 0.5 tablets (20 mg total) every evening. 10/20/17 10/20/18  Suarez, Tiffany L, DO  ROCKLATAN 0.02-0.005 % SOLN  08/21/18   [provider]  zolpidem (AMBIEN) 5 MG tablet TAKE ONE TABLET BY MOUTH DAILY AS NEEDED FOR SLEEP 10/03/18   Suarez, Tiffany L, DO  Zoster Vaccine Adjuvanted [facility]) injection Inject 0.5 mLs into the muscle once.  [provider]    Physical Exam: Vitals:   11/16/18 2243 11/16/18 2300 11/16/18 2315 11/17/18 0200  BP: (!) 130/98 (!) 124/93 (!) 152/96 117/87  Pulse: 99 (!) 52 85 (!) 101  Resp: (!) 22 20 15  (!) 21  Temp: 97.8 F (36.6 C)     TempSrc: Oral     SpO2: 100% 96% 95% 94%    Physical Exam  Constitutional: He appears well-developed and well-nourished. No distress.  HENT:  Head: Normocephalic.  Eyes: Right eye exhibits no discharge. Left eye exhibits no discharge.  Neck: Neck supple. No JVD present.  Cardiovascular: Normal rate, regular rhythm and intact distal pulses.  Pulmonary/Chest: Effort normal. He has no wheezes.  Anterior lung fields auscultated Equal air entry bilaterally Satting well on room air  Abdominal: Soft. Bowel sounds are normal. He exhibits no distension. There is no abdominal tenderness. There is no guarding.  Musculoskeletal:        General: Edema present.     Comments: +2 to +3 pitting edema of bilateral lower extremities Left lower extremity shortened and externally rotated  Neurological:  Awake and alert  Skin: Skin is warm and dry. He is not diaphoretic.     Labs on Admission: I have personally reviewed following labs and imaging  studies  CBC: Recent Labs  Lab 11/17/18 0118  WBC 7.4  NEUTROABS 5.6  HGB 15.4  HCT 49.0  MCV 101.0*  PLT 703   Basic Metabolic Panel: Recent Labs  Lab 11/17/18 0118  NA 141  K 3.4*  CL 107  CO2 23  GLUCOSE 111*  BUN 27*  CREATININE 1.50*  CALCIUM 8.8*   GFR: CrCl cannot be calculated (Unknown ideal weight.). Liver Function Tests: No results for input(s): AST, ALT, ALKPHOS, BILITOT, PROT, ALBUMIN in the last 168 hours. No results for input(s): LIPASE, AMYLASE in the last 168 hours. No results for input(s): AMMONIA in the last 168 hours. Coagulation Profile: Recent Labs  Lab 11/17/18 0118  INR 1.4*   Cardiac Enzymes: No results for input(s): CKTOTAL, CKMB, CKMBINDEX, TROPONINI in the last 168 hours. BNP (last 3 results) No results for input(s): PROBNP in the last 8760 hours. HbA1C: No results for input(s): HGBA1C in the last 72 hours. CBG: No results for input(s): GLUCAP in the last 168 hours. Lipid Profile: No results for input(s): CHOL, HDL, LDLCALC, TRIG, CHOLHDL, LDLDIRECT in the last 72 hours. Thyroid Function Tests: No results for input(s): TSH, T4TOTAL, FREET4, T3FREE, THYROIDAB in the last 72 hours. Anemia Panel: No results for input(s): VITAMINB12, FOLATE, FERRITIN, TIBC, IRON, RETICCTPCT in the last 72 hours. Urine analysis:    Component Value Date/Time   COLORURINE STRAW (A) 08/11/2017 1400   APPEARANCEUR CLEAR 08/11/2017 1400   LABSPEC 1.006 08/11/2017 1400   PHURINE 5.0 08/11/2017 1400   GLUCOSEU NEGATIVE 08/11/2017 1400   HGBUR NEGATIVE 08/11/2017 1400   BILIRUBINUR NEGATIVE 08/11/2017 1400   KETONESUR NEGATIVE 08/11/2017 1400   PROTEINUR NEGATIVE 08/11/2017 1400   UROBILINOGEN 0.2 01/26/2011 1220   NITRITE NEGATIVE 08/11/2017 1400   LEUKOCYTESUR NEGATIVE 08/11/2017 1400    Radiological Exams on Admission: Dg Chest 1 View  Result Date: 11/17/2018 CLINICAL DATA:  Initial evaluation for acute hip fracture, preoperative evaluation.  EXAM: CHEST  1 VIEW COMPARISON:  Prior radiograph from 08/10/2017. FINDINGS: Advanced cardiomegaly, stable. Mediastinal silhouette within normal limits. Aortic atherosclerosis. Lungs mildly hypoinflated. Mild diffuse pulmonary interstitial congestion without frank alveolar edema. Suspected small bilateral pleural effusions. No consolidative opacity. No pneumothorax. No  acute osseous finding. IMPRESSION: 1. Cardiomegaly with mild diffuse pulmonary interstitial congestion and small bilateral pleural effusions. 2. Aortic atherosclerosis. Electronically Signed   By: Jeannine Boga M.D.   On: 11/17/2018 01:20   Dg Elbow Complete Left  Result Date: 11/17/2018 CLINICAL DATA:  Initial evaluation for acute trauma, fall. EXAM: LEFT ELBOW - COMPLETE 3+ VIEW COMPARISON:  None. FINDINGS: No acute fracture dislocation. No joint effusion. Radial head intact. Mild degenerative spurring at the medial and lateral epicondyles noted. No acute soft tissue abnormality. IMPRESSION: No acute osseous abnormality about the left elbow. Electronically Signed   By: Jeannine Boga M.D.   On: 11/17/2018 00:48   Ct Head Wo Contrast  Result Date: 11/17/2018 CLINICAL DATA:  Initial evaluation for acute trauma, fall. EXAM: CT HEAD WITHOUT CONTRAST TECHNIQUE: Contiguous axial images were obtained from the base of the skull through the vertex without intravenous contrast. COMPARISON:  Prior MRI from 08/12/2017. FINDINGS: Brain: Moderately advanced age-related cerebral atrophy with chronic small vessel ischemic disease. Superimposed small remote left cerebellar infarct. No acute intracranial hemorrhage. No acute large vessel territory infarct. No mass lesion, midline shift or mass effect. No hydrocephalus. No extra-axial fluid collection. Vascular: No hyperdense vessel. Calcified atherosclerosis present at the skull base. Skull: Scalp soft tissues within normal limits.  Calvarium intact. Sinuses/Orbits: Globes and orbital soft tissues  demonstrate no acute finding. Mild mucosal thickening within the ethmoidal air cells and maxillary sinuses. Paranasal sinuses are otherwise clear. Trace bilateral mastoid effusions, of doubtful significance. Other: None. IMPRESSION: 1. No acute intracranial abnormality. 2. Moderately advanced age-related cerebral atrophy with chronic microvascular ischemic disease. Electronically Signed   By: Jeannine Boga M.D.   On: 11/17/2018 01:17   Dg Knee Complete 4 Views Left  Result Date: 11/17/2018 CLINICAL DATA:  Initial evaluation for acute trauma, fall. EXAM: LEFT KNEE - COMPLETE 4+ VIEW COMPARISON:  None. FINDINGS: No acute fracture dislocation. No joint effusion. Advanced osteoarthritic changes about the knee. No acute soft tissue abnormality. Prominent atherosclerotic change noted. IMPRESSION: 1. No acute osseous abnormality about the left knee. 2. Advanced degenerative osteoarthrosis. Electronically Signed   By: Jeannine Boga M.D.   On: 11/17/2018 00:46   Dg Hip Unilat W Or Wo Pelvis 2-3 Views Left  Result Date: 11/17/2018 CLINICAL DATA:  Initial evaluation for acute trauma, fall. EXAM: DG HIP (WITH OR WITHOUT PELVIS) 2-3V LEFT COMPARISON:  None. FINDINGS: There is an acute minimally displaced and impacted subcapital fracture of the left femoral neck. Left femoral head remains normally position within the acetabulum. Femoral head height maintained. Bony pelvis intact. Limited views of the right hip demonstrate no acute finding. No acute soft tissue abnormality. Prominent atherosclerosis noted within the proximal thighs. Vascular clips overlie the proximal right thigh. IMPRESSION: Acute mildly displaced subcapital fracture of the left femoral neck. Electronically Signed   By: Jeannine Boga M.D.   On: 11/17/2018 00:45    EKG: Independently reviewed.  A. fib, RBBB, PVCs, QTC prolongation.  RBBB seen on previous tracing as well.  Assessment/Plan Principal Problem:   Hip fracture  (HCC) Active Problems:   Paroxysmal atrial fibrillation (HCC)   Chronic systolic congestive heart failure ([place])   Fall   AKI (acute kidney injury) ([place])  Left hip fracture X-ray with evidence of acute mildly displaced subcapital fracture of the left femoral neck.  ED provider discussed with Dr. Marlou Sa from orthopedics, patient will be seen in the morning. -Keep n.p.o. -IV morphine 0.5 mg every 2 hours as needed -Norco 5-325  mg 1 to 2 tablets every 6 hours as needed  Unwitnessed fall Seems mechanical based on limited history provided by the patient.  Denies any chest pain prior to the fall.  Does report feeling lightheaded and a little dyspneic.  Not hypotensive.  Currently no signs of respiratory distress and satting well on room air.  PE less likely as patient denies loss of consciousness.  Currently not tachycardic or tachypneic.  Infectious etiology less likely as patient is afebrile and does not have leukocytosis.  Chest x-ray not suggestive of pneumonia.  Head CT negative for acute finding.  X-ray with evidence of left hip fracture as mentioned above.  X-ray of left knee negative for acute finding.  X-ray of left elbow negative for acute finding. -Cardiac monitoring -Patient will need PT and OT evaluation after surgery -EKG not suggestive of ACS.  Check troponin level. -Check UA to rule out UTI -Check orthostatics  AKI on CKD 3 -BUN 27.  Creatinine 1.5, baseline 1.0-1.1 a year ago. -Will hold off giving IV fluid at this time as chest x-ray suggestive of mild pulmonary congestion.  BNP will be checked. -Continue to monitor renal function -Avoid nephrotoxic agents  Chronic systolic congestive heart failure Chest x-ray showing cardiomegaly with mild diffuse pulmonary interstitial congestion and small bilateral pleural effusions.  Patient currently not displaying any signs of respiratory distress.  He is satting well on room air.  Does have bilateral lower extremity edema but no JVD on  exam.  No rales appreciated on auscultation of anterior lung fields.  Unable to auscultate posterior lung fields as patient is not able to sit up in bed due to pain from hip fracture. Last echo done in January 2019 with EF 20 to 25%. -Check BNP to assess need for diuresis -Continue to monitor   Paroxysmal atrial fibrillation Currently rate controlled. CHADS2VASC 5.  Hold Eliquis at this time as hip surgery anticipated in the morning.   Unable to safely order home medications at this time as pharmacy medication reconciliation is pending.  DVT prophylaxis: SCDs Code Status: Full code.  Unable to discuss with the patient at this time.  Family Communication: No family available. Disposition Plan: Anticipate discharge after clinical improvement. Consults called: Orthopedics Admission status: It is my clinical opinion that admission to INPATIENT is reasonable and necessary in this 85 y.o. male  presenting with left hip fracture secondary to fall  in the context of PMH including: See above  with pertinent positives on physical exam including: Left lower extremity shortened and externally rotated  and pertinent positives on radiographic and laboratory data including: X-ray with evidence of left hip fracture.  Workup and treatment include plan mentioned above and evaluation by orthopedics in the morning.  Patient will need IV pain medication.  Given the aforementioned, the predictability of an adverse outcome is felt to be significant. I expect that the patient will require at least 2 midnights in the hospital to treat this condition.   This chart was dictated using voice recognition software.  Despite best efforts to proofread, errors can occur which can change the documentation meaning.  Shela Leff MD [facility] Pager [phone]  If 7PM-7AM, please contact night-coverage [email or web address] Password [email or web address]  11/17/2018, 3:04 AM

## 2018-11-17 NOTE — ED Notes (Signed)
ED TO INPATIENT HANDOFF REPORT  ED Nurse Name and Phone #: Suezanne Jacquet [phone]  S Name/Age/Gender Timothy Suarez 85 y.o. male Room/Bed: 022C/022C  Code Status   Code Status: Prior  Home/SNF/Other Nursing Home Patient oriented to: self and place Is this baseline? Yes   Triage Complete: Triage complete  Chief Complaint 85yo fall unknown downtime  Triage Note BIB [facility] from [facility] with c/o of unwitnessed fall. Per EMS pt reports pain to back of head, left hip and left knee.    Allergies Allergies  Allergen Reactions  . Metoprolol Other (See Comments)    Hypersensitive to beta blockers with associated hypotension and bradycardia  . Citalopram Nausea Only  . Trazodone And Nefazodone Other (See Comments)    Dry mouth  . Zoloft [Sertraline Hcl] Other (See Comments)    dizzy    Level of Care/Admitting Diagnosis ED Disposition    ED Disposition Condition [facility] Area: [place] [[id number]]  Level of Care: Telemetry Medical [104]  Diagnosis: Hip fracture [facility]) [[id number]]  Admitting Physician: Shela Leff [[id number]]  Attending Physician: Shela Leff [[id number]]  Estimated length of stay: past midnight tomorrow  Certification:: I certify this patient will need inpatient services for at least 2 midnights  PT Class (Do Not Modify): Inpatient [101]  PT Acc Code (Do Not Modify): Private [1]       B Medical/Surgery History Past Medical History:  Diagnosis Date  . Arthritis   . Cerebral ischemia   . CKD (chronic kidney disease), stage III ([place])   . Colon cancer [facility])     s/p partial colectomy  . Complication of anesthesia    "woke up w/confusion and hallucinations once after mitral valve OR"  . Coronary artery disease    a. pre-surgical cath 2010 showed 50-60% of the LAD with negative nuclear stress test at that time. b. normal nuc 2016.  . First degree AV block   . Glaucoma   . Hernia   . HTN (hypertension)     takes Amlodipine daily  . Hyperlipidemia   . Major depression   . Mild cognitive impairment   . Mitral regurgitation    a. mild-mod MR by echo 08/2017.  . Mobitz type 1 second degree atrioventricular block   . Mobitz type 2 second degree atrioventricular block   . Moderate aortic insufficiency   . MVP (mitral valve prolapse)    S/P Rt mini thoractomy for Mitral Valve repair  . Paroxysmal atrial fibrillation (HCC)    S/P Maze procedure  . 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 Premature atrial contractions   . Prostate cancer ([place])   . PVC's (premature ventricular contractions)   . RBBB   . 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 Sleep disturbance   . Thoracic aortic aneurysm ([place])    a. last echo 08/2017: mildly dilated aortic root.  . Thrombocytopenia ([place])   . Wandering atrial pacemaker    Past Surgical History:  Procedure Laterality Date  . CATARACT EXTRACTION W/ INTRAOCULAR LENS  IMPLANT, BILATERAL Bilateral   . COLECTOMY  2004   Dr [name]  . DIRECT LARYNGOSCOPY N/A 01/15/2016   Procedure: DIRECT LARYNGOSCOPY WITH BIOPSY AND FROZEN SECTION;  Surgeon: Izora Gala, MD;  Location: [place];  Service: ENT;  Laterality: N/A;  . FlexHD patch repair of chest wall hernia.  01/28/2011   [name]  . GASTROSTOMY W/ FEEDING  TUBE    . IR GENERIC HISTORICAL  10/08/2016   IR GASTROSTOMY TUBE REMOVAL 10/08/2016 Sandi Mariscal, MD [facility]-INTERV RAD  . JOINT REPLACEMENT    . LARYNGETOMY N/A 01/15/2016   Procedure:  TOTAL LARYNGECTOMY;  Surgeon: Izora Gala, MD;  Location: [facility] OR;  Service: ENT;  Laterality: N/A;  . MAZE  12/27/2008   left side lesion set  . MICROLARYNGOSCOPY Right 01/29/2015   Procedure: MICROLARYNGOSCOPY WITH BIOSPY OF RIGHT VOCAL CORD;  Surgeon: Izora Gala, MD;  Location: [place];  Service: ENT;  Laterality: Right;  . MITRAL VALVE REPAIR  12/27/2008   complex valvuloplasty with 4mm Memo 3D annuloplasty ring via right  minithoracotomy  . PECTORALIS FLAP Left 05/04/2016   Procedure: PECTORALIS FLAP to neck with possible, split thickness skin graft;  Surgeon: Loel Lofty Dillingham, DO;  Location: [place];  Service: Plastics;  Laterality: Left;  . PROSTATE BIOPSY    . SKIN SPLIT GRAFT Left 05/04/2016   Procedure: PECTORALIS MAJOR MYOCUTANEOUS FLAP RECONSTRUCTION OF PHARYNX AND SPLIT THICKNESS SKIN GRAFT;  Surgeon: Izora Gala, MD;  Location: [place];  Service: ENT;  Laterality: Left;  . TEE WITHOUT CARDIOVERSION N/A 01/03/2013   Procedure: TRANSESOPHAGEAL ECHOCARDIOGRAM (TEE);  Surgeon: Sueanne Margarita, MD;  Location: [place];  Service: Cardiovascular;  Laterality: N/A;  . TOTAL KNEE ARTHROPLASTY Right 2003  . TRACHEAL ESOPHAGEAL PUNCTURE REPAIR N/A 03/26/2016   Procedure: TRACHEAL ESOPHAGEAL PUNCTURE;  Surgeon: Izora Gala, MD;  Location: [place];  Service: ENT;  Laterality: N/A;  . TRACHEOESOPHAGEAL FISTULA REPAIR N/A 03/26/2016   Procedure: TRACHEO-ESOPHAGEAL   PUNCTURE,FISTULAR CLOSURE;  Surgeon: Izora Gala, MD;  Location: [place];  Service: ENT;  Laterality: N/A;  . TRACHEOESOPHAGEAL FISTULA REPAIR N/A 04/09/2016   Procedure: FISTULA REPAIR WITH  MUSCLE ROTATION FLAP;  Surgeon: Izora Gala, MD;  Location: [place];  Service: ENT;  Laterality: N/A;  . TRACHEOESOPHAGEAL FISTULA REPAIR N/A 07/27/2016   Procedure: CLOSURE OF FISTULA;  Surgeon: Izora Gala, MD;  Location: [place];  Service: ENT;  Laterality: N/A;  . TYMPANOPLASTY  1967   "? side"     A IV Location/Drains/Wounds Patient Lines/Drains/Airways Status   Active Line/Drains/Airways    Name:   Placement date:   Placement time:   Site:   Days:   Peripheral IV 08/17/17 Right;Posterior Forearm   08/17/17    2325    Forearm   457   Closed System Drain 1 Midline Neck Bulb (JP) 7 Fr.   04/09/16    0831    Neck   952   External Urinary Catheter   12/06/16    1420    -   711   Incision (Closed) 03/26/16 Neck Other (Comment)   03/26/16    1222     966   Incision  (Closed) 04/09/16 Neck Other (Comment)   04/09/16    0847     952   Incision (Closed) 05/04/16 Chest Left   05/04/16    1007     927   Incision (Closed) 07/27/16 Neck Other (Comment)   07/27/16    1057     843   Wound / Incision (Open or Dehisced) Throat pharyngocutaneous fistula   -    -    Throat             Intake/Output Last 24 hours No intake or output data in the 24 hours ending 11/17/18 0204  Labs/Imaging Results for orders placed or performed during the hospital encounter of 11/16/18 (  from the past 48 hour(s))  Basic metabolic panel     Status: Abnormal   Collection Time: 11/17/18  1:18 AM  Result Value Ref Range   Sodium 141 135 - 145 mmol/L   Potassium 3.4 (L) 3.5 - 5.1 mmol/L   Chloride 107 98 - 111 mmol/L   CO2 23 22 - 32 mmol/L   Glucose, Bld 111 (H) 70 - 99 mg/dL   BUN 27 (H) 8 - 23 mg/dL   Creatinine, Ser 1.50 (H) 0.61 - 1.24 mg/dL   Calcium 8.8 (L) 8.9 - 10.3 mg/dL   GFR calc non Af Amer 40 (L) >60 mL/min   GFR calc Af Amer 47 (L) >60 mL/min   Anion gap 11 5 - 15    Comment: Performed at [facility] [address], [place], [place] [address]  CBC with Differential     Status: Abnormal   Collection Time: 11/17/18  1:18 AM  Result Value Ref Range   WBC 7.4 4.0 - 10.5 K/uL   RBC 4.85 4.22 - 5.81 MIL/uL   Hemoglobin 15.4 13.0 - 17.0 g/dL   HCT 49.0 39.0 - 52.0 %   MCV 101.0 (H) 80.0 - 100.0 fL   MCH 31.8 26.0 - 34.0 pg   MCHC 31.4 30.0 - 36.0 g/dL   RDW 14.0 11.5 - 15.5 %   Platelets 150 150 - 400 K/uL   nRBC 0.0 0.0 - 0.2 %   Neutrophils Relative % 75 %   Neutro Abs 5.6 1.7 - 7.7 K/uL   Lymphocytes Relative 15 %   Lymphs Abs 1.1 0.7 - 4.0 K/uL   Monocytes Relative 7 %   Monocytes Absolute 0.5 0.1 - 1.0 K/uL   Eosinophils Relative 2 %   Eosinophils Absolute 0.1 0.0 - 0.5 K/uL   Basophils Relative 1 %   Basophils Absolute 0.0 0.0 - 0.1 K/uL   Immature Granulocytes 0 %   Abs Immature Granulocytes 0.01 0.00 - 0.07 K/uL    Comment: Performed at  [facility] [address], [place], [place] [address]  Protime-INR     Status: Abnormal   Collection Time: 11/17/18  1:18 AM  Result Value Ref Range   Prothrombin Time 16.6 (H) 11.4 - 15.2 seconds   INR 1.4 (H) 0.8 - 1.2    Comment: (NOTE) INR goal varies based on device and disease states. Performed at [facility], [place] [address], [place], [place] [address]   APTT     Status: Abnormal   Collection Time: 11/17/18  1:18 AM  Result Value Ref Range   aPTT 39 (H) 24 - 36 seconds    Comment:        IF BASELINE aPTT IS ELEVATED, SUGGEST PATIENT RISK ASSESSMENT BE USED TO DETERMINE APPROPRIATE ANTICOAGULANT THERAPY. Performed at [facility], [place] [address], [place], [place] [address]    Dg Chest 1 View  Result Date: 11/17/2018 CLINICAL DATA:  Initial evaluation for acute hip fracture, preoperative evaluation. EXAM: CHEST  1 VIEW COMPARISON:  Prior radiograph from 08/10/2017. FINDINGS: Advanced cardiomegaly, stable. Mediastinal silhouette within normal limits. Aortic atherosclerosis. Lungs mildly hypoinflated. Mild diffuse pulmonary interstitial congestion without frank alveolar edema. Suspected small bilateral pleural effusions. No consolidative opacity. No pneumothorax. No acute osseous finding. IMPRESSION: 1. Cardiomegaly with mild diffuse pulmonary interstitial congestion and small bilateral pleural effusions. 2. Aortic atherosclerosis. Electronically Signed   By: Jeannine Boga M.D.   On: 11/17/2018 01:20   Dg  Elbow Complete Left  Result Date: 11/17/2018 CLINICAL DATA:  Initial evaluation for acute trauma, fall. EXAM: LEFT ELBOW - COMPLETE 3+ VIEW COMPARISON:  None. FINDINGS: No acute fracture dislocation. No joint effusion. Radial head intact. Mild degenerative spurring at the medial and lateral epicondyles noted. No acute soft tissue abnormality. IMPRESSION: No acute osseous abnormality about the left elbow. Electronically Signed   By: Jeannine Boga M.D.    On: 11/17/2018 00:48   Ct Head Wo Contrast  Result Date: 11/17/2018 CLINICAL DATA:  Initial evaluation for acute trauma, fall. EXAM: CT HEAD WITHOUT CONTRAST TECHNIQUE: Contiguous axial images were obtained from the base of the skull through the vertex without intravenous contrast. COMPARISON:  Prior MRI from 08/12/2017. FINDINGS: Brain: Moderately advanced age-related cerebral atrophy with chronic small vessel ischemic disease. Superimposed small remote left cerebellar infarct. No acute intracranial hemorrhage. No acute large vessel territory infarct. No mass lesion, midline shift or mass effect. No hydrocephalus. No extra-axial fluid collection. Vascular: No hyperdense vessel. Calcified atherosclerosis present at the skull base. Skull: Scalp soft tissues within normal limits.  Calvarium intact. Sinuses/Orbits: Globes and orbital soft tissues demonstrate no acute finding. Mild mucosal thickening within the ethmoidal air cells and maxillary sinuses. Paranasal sinuses are otherwise clear. Trace bilateral mastoid effusions, of doubtful significance. Other: None. IMPRESSION: 1. No acute intracranial abnormality. 2. Moderately advanced age-related cerebral atrophy with chronic microvascular ischemic disease. Electronically Signed   By: Jeannine Boga M.D.   On: 11/17/2018 01:17   Dg Knee Complete 4 Views Left  Result Date: 11/17/2018 CLINICAL DATA:  Initial evaluation for acute trauma, fall. EXAM: LEFT KNEE - COMPLETE 4+ VIEW COMPARISON:  None. FINDINGS: No acute fracture dislocation. No joint effusion. Advanced osteoarthritic changes about the knee. No acute soft tissue abnormality. Prominent atherosclerotic change noted. IMPRESSION: 1. No acute osseous abnormality about the left knee. 2. Advanced degenerative osteoarthrosis. Electronically Signed   By: Jeannine Boga M.D.   On: 11/17/2018 00:46   Dg Hip Unilat W Or Wo Pelvis 2-3 Views Left  Result Date: 11/17/2018 CLINICAL DATA:  Initial  evaluation for acute trauma, fall. EXAM: DG HIP (WITH OR WITHOUT PELVIS) 2-3V LEFT COMPARISON:  None. FINDINGS: There is an acute minimally displaced and impacted subcapital fracture of the left femoral neck. Left femoral head remains normally position within the acetabulum. Femoral head height maintained. Bony pelvis intact. Limited views of the right hip demonstrate no acute finding. No acute soft tissue abnormality. Prominent atherosclerosis noted within the proximal thighs. Vascular clips overlie the proximal right thigh. IMPRESSION: Acute mildly displaced subcapital fracture of the left femoral neck. Electronically Signed   By: Jeannine Boga M.D.   On: 11/17/2018 00:45    Pending Labs Unresulted Labs (From admission, onward)   None      Vitals/Pain Today's Vitals   11/16/18 2243 11/16/18 2245 11/16/18 2300 11/16/18 2315  BP: (!) 130/98  (!) 124/93 (!) 152/96  Pulse: 99  (!) 52 85  Resp: (!) 22  20 15   Temp: 97.8 F (36.6 C)     TempSrc: Oral     SpO2: 100%  96% 95%  PainSc:  8       Isolation Precautions No active isolations  Medications Medications  bacitracin ointment (1 application Topical Given 11/17/18 0137)  fentaNYL (SUBLIMAZE) injection 75 mcg (75 mcg Intravenous Given 11/17/18 0137)    Mobility non-ambulatory High fall risk   Focused Assessments Ortho   R Recommendations: See Admitting Provider Note  Report given to:  Additional Notes:  Pt speaks with passymuirr  valve and is difficult to understand.

## 2018-11-17 NOTE — Progress Notes (Signed)
Obtained surgical consent for left partial hip replacement via phone with [name], [place]. Verified by Remo Lipps, RN.

## 2018-11-17 NOTE — Consult Note (Signed)
Cardiology Consultation:   Patient ID: [name] MRN: [id number]; DOB: August 22, 1924  Admit date: 11/16/2018 Date of Consult: 11/17/2018  Primary Care Provider: Gayland Curry, DO Primary Cardiologist: Fransico Him, MD  Primary Electrophysiologist:  None    Patient Profile:   Timothy Suarez is a 85 y.o. male w/ h/o laryngeal cancer s/p tracheostomy and uses a passy muir speaking valve to communicate, as well as h/o colon cancer, MVP w/ severe MR s/p minimally invasive mitral valve repair, nonobstructive CAD by cath at time of valve repair (50-70% LAD lesion), chronic systolic HF/DCM w/ EF of 53-29%, PAF s/p Maze procedure, chronic anticoagulation w/ Eliquis, thoracic aortic aneurysm, second-degree AV block type I and II, RBBB, HTN, HLD, h/o CVA and mild cognitive impairment, admitted after a unwitnessed, presumed, mechanical fall resulting in left femoral neck fracture. He is being seen today for preoperative evaluation/cardiac risk assessment for hip arthoplasty, at the request of Dr. Tyrell Antonio, Internal Medicine and Dr. Erlinda Hong, Orthopedics.  History of Present Illness:   Timothy Suarez is a 85 y.o. male, followed by Dr. Radford Pax,  with a hx of PAF s/p Maze procedure, chronic anticoagulation w/ Eliquis, MVP with severe MR s/p repair, HTN, nonobstructive CAD by cath with 50-70% LAD at time of MV repair, colon cancer, chronic systolic HF with DCM (EF 92-42%), chronic RBBB and second degree AVB type 1 and 2.  Per Dr. Theodosia Blender last office note, he has been on medical management and no further invasive workup recommended given his advanced age, laryngeal CA and other comorbidities.   Pt  has a h/o laryngeal cancer s/p tracheostomy. It is mentioned in H&P that patient uses a passy muir speaking valve to communicate but it was extremely difficult to understand him in the ED due to device malfunction. Thus, history from the patient has been very limited.   Pt presented to the [facility] ED overnight after  sustaining an unwitnessed fall. Presumed to be mechanical, based on what has been obtained from history. He sustained a left femoral neck fracture. Ortho has seen and pt is tentatively scheduled to go to OR tomorrow, 11/18/18, for surgical repair. Cardiology asked to assess for preoperative evaluation/ cardiac risk assessment.   Other notable findings include elevated BNP at 3,122. Troponin I 0.03. CXR shows cardiomegaly with mild diffuse pulmonary interstitial congestion and small bilateral pleural effusions. EKG shows chronic RBBB, afib and PVCs. Scr up from baseline of 1.1 to 1.5 on admit. Also hypokalemic at 3.3. Mildly hypertensive with SBPs in the 140s-150s. Pt started on IV Lasix for acute on chronic CHF. Eliquis held in anticipation for surgery. IM asking cardiology to weigh in on need for bridging.    Past Medical History:  Diagnosis Date  . Arthritis   . Cerebral ischemia   . CKD (chronic kidney disease), stage III ([place])   . Colon cancer [facility])     s/p partial colectomy  . Complication of anesthesia    "woke up w/confusion and hallucinations once after mitral valve OR"  . Coronary artery disease    a. pre-surgical cath 2010 showed 50-60% of the LAD with negative nuclear stress test at that time. b. normal nuc 2016.  . First degree AV block   . Glaucoma   . Hernia   . HTN (hypertension)    takes Amlodipine daily  . Hyperlipidemia   . Major depression   . Mild cognitive impairment   . Mitral regurgitation    a. mild-mod MR by echo 08/2017.  Marland Kitchen  Mobitz type 1 second degree atrioventricular block   . Mobitz type 2 second degree atrioventricular block   . Moderate aortic insufficiency   . MVP (mitral valve prolapse)    S/P Rt mini thoractomy for Mitral Valve repair  . Paroxysmal atrial fibrillation (HCC)    S/P Maze procedure  . 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 Premature atrial contractions   . Prostate cancer ([place])   . PVC's  (premature ventricular contractions)   . RBBB   . 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 Sleep disturbance   . Thoracic aortic aneurysm ([place])    a. last echo 08/2017: mildly dilated aortic root.  . Thrombocytopenia ([place])   . Wandering atrial pacemaker     Past Surgical History:  Procedure Laterality Date  . CATARACT EXTRACTION W/ INTRAOCULAR LENS  IMPLANT, BILATERAL Bilateral   . COLECTOMY  2004   Dr [name]  . DIRECT LARYNGOSCOPY N/A 01/15/2016   Procedure: DIRECT LARYNGOSCOPY WITH BIOPSY AND FROZEN SECTION;  Surgeon: Izora Gala, MD;  Location: [place];  Service: ENT;  Laterality: N/A;  . FlexHD patch repair of chest wall hernia.  01/28/2011   [name]  . GASTROSTOMY W/ FEEDING TUBE    . IR GENERIC HISTORICAL  10/08/2016   IR GASTROSTOMY TUBE REMOVAL 10/08/2016 Sandi Mariscal, MD [facility]-INTERV RAD  . JOINT REPLACEMENT    . LARYNGETOMY N/A 01/15/2016   Procedure:  TOTAL LARYNGECTOMY;  Surgeon: Izora Gala, MD;  Location: [facility] OR;  Service: ENT;  Laterality: N/A;  . MAZE  12/27/2008   left side lesion set  . MICROLARYNGOSCOPY Right 01/29/2015   Procedure: MICROLARYNGOSCOPY WITH BIOSPY OF RIGHT VOCAL CORD;  Surgeon: Izora Gala, MD;  Location: [place];  Service: ENT;  Laterality: Right;  . MITRAL VALVE REPAIR  12/27/2008   complex valvuloplasty with 49mm Memo 3D annuloplasty ring via right minithoracotomy  . PECTORALIS FLAP Left 05/04/2016   Procedure: PECTORALIS FLAP to neck with possible, split thickness skin graft;  Surgeon: Loel Lofty Dillingham, DO;  Location: [place];  Service: Plastics;  Laterality: Left;  . PROSTATE BIOPSY    . SKIN SPLIT GRAFT Left 05/04/2016   Procedure: PECTORALIS MAJOR MYOCUTANEOUS FLAP RECONSTRUCTION OF PHARYNX AND SPLIT THICKNESS SKIN GRAFT;  Surgeon: Izora Gala, MD;  Location: [place];  Service: ENT;  Laterality: Left;  . TEE WITHOUT CARDIOVERSION N/A 01/03/2013   Procedure: TRANSESOPHAGEAL ECHOCARDIOGRAM (TEE);  Surgeon: Sueanne Margarita, MD;  Location: [place];  Service: Cardiovascular;  Laterality: N/A;  . TOTAL KNEE ARTHROPLASTY Right 2003  . TRACHEAL ESOPHAGEAL PUNCTURE REPAIR N/A 03/26/2016   Procedure: TRACHEAL ESOPHAGEAL PUNCTURE;  Surgeon: Izora Gala, MD;  Location: [facility] OR;  Service: ENT;  Laterality: N/A;  . TRACHEOESOPHAGEAL FISTULA REPAIR N/A 03/26/2016   Procedure: TRACHEO-ESOPHAGEAL   PUNCTURE,FISTULAR CLOSURE;  Surgeon: Izora Gala, MD;  Location: [facility] OR;  Service: ENT;  Laterality: N/A;  . TRACHEOESOPHAGEAL FISTULA REPAIR N/A 04/09/2016   Procedure: FISTULA REPAIR WITH  MUSCLE ROTATION FLAP;  Surgeon: Izora Gala, MD;  Location: [place];  Service: ENT;  Laterality: N/A;  . TRACHEOESOPHAGEAL FISTULA REPAIR N/A 07/27/2016   Procedure: CLOSURE OF FISTULA;  Surgeon: Izora Gala, MD;  Location: [place];  Service: ENT;  Laterality: N/A;  . TYMPANOPLASTY  1967   "? side"     Home Medications:  Prior to Admission medications   Medication Sig Start Date End Date Taking? Authorizing Provider  atorvastatin (  LIPITOR) 40 MG tablet TAKE ONE TABLET BY MOUTH DAILY 09/16/18   Reed, Tiffany L, DO  ELIQUIS 5 MG TABS tablet TAKE ONE TABLET BY MOUTH TWICE A DAY 01/14/18   Sueanne Margarita, MD  furosemide (LASIX) 40 MG tablet Take 1 tablet (40 mg total) by mouth every morning AND 0.5 tablets (20 mg total) every evening. 10/20/17 10/20/18  Reed, Tiffany L, DO  ROCKLATAN 0.02-0.005 % SOLN  08/21/18   [provider]  zolpidem (AMBIEN) 5 MG tablet TAKE ONE TABLET BY MOUTH DAILY AS NEEDED FOR SLEEP 10/03/18   Reed, Tiffany L, DO  Zoster Vaccine Adjuvanted [facility]) injection Inject 0.5 mLs into the muscle once.    [provider]    Inpatient Medications: Scheduled Meds: . docusate sodium  100 mg Oral BID  . furosemide  40 mg Intravenous Q12H   Continuous Infusions:  PRN Meds: HYDROcodone-acetaminophen, morphine injection, senna-docusate  Allergies:    Allergies  Allergen Reactions  . Metoprolol Other (See Comments)    Hypersensitive  to beta blockers with associated hypotension and bradycardia  . Citalopram Nausea Only  . Trazodone And Nefazodone Other (See Comments)    Dry mouth  . Zoloft [Sertraline Hcl] Other (See Comments)    dizzy    Social History:   Social History   Socioeconomic History  . Marital status: Widowed    Spouse name: Not on file  . Number of children: 2  . Years of education: 44  . Highest education level: Not on file  Occupational History  . Occupation: retired Dispensing optician  . Financial resource strain: Not hard at all  . Food insecurity:    Worry: Never true    Inability: Never true  . Transportation needs:    Medical: No    Non-medical: No  Tobacco Use  . Smoking status: Never Smoker  . Smokeless tobacco: Never Used  Substance and Sexual Activity  . Alcohol use: Yes    Alcohol/week: 0.0 standard drinks    Comment: rare  . Drug use: No  . Sexual activity: Never  Lifestyle  . Physical activity:    Days per week: 0 days    Minutes per session: 0 min  . Stress: Not at all  Relationships  . Social connections:    Talks on phone: More than three times a week    Gets together: More than three times a week    Attends religious service: Never    Active member of club or organization: No    Attends meetings of clubs or organizations: Never    Relationship status: Widowed  . Intimate partner violence:    Fear of current or ex partner: No    Emotionally abused: No    Physically abused: No    Forced sexual activity: No  Other Topics Concern  . Not on file  Social History Narrative   Widowed   Never smoked   Alcohol  Occasionally    Exercise - walking the [employer], retail with cane   [place], HCPOA             Family History:    Family History  Problem Relation Age of Onset  . Cancer Mother        lymphoma  . Cancer Father        pancreatic  . Heart attack Neg Hx   . Stroke Neg Hx      ROS:  Please see the history of present  illness.   All other ROS reviewed and negative.     Physical Exam/Data:   Vitals:   11/16/18 2315 11/17/18 0200 11/17/18 0307 11/17/18 0700  BP: (!) 152/96 117/87 140/79   Pulse: 85 (!) 101 (!) 120 84  Resp: 15 (!) 21 19   Temp:   97.9 F (36.6 C)   TempSrc:   Axillary   SpO2: 95% 94% 97%     Intake/Output Summary (Last 24 hours) at 11/17/2018 0857 Last data filed at 11/17/2018 5277 Gross per 24 hour  Intake 0 ml  Output 0 ml  Net 0 ml   Last 3 Weights 05/02/2018 05/02/2018 03/28/2018  Weight (lbs) 178 lb 178 lb 177 lb 8 oz  Weight (kg) 80.74 kg 80.74 kg 80.513 kg     There is no height or weight on file to calculate BMI.   Physical Exam per MD. See MD portion of note for exam findings.   EKG:  The EKG was personally reviewed and demonstrates:  Afib, PVCs, chronic RBBB Telemetry:  To be reviewed by MD. See MD portion of note  Relevant CV Studies: 2D Echo 08/13/2017 Study Conclusions  - Left ventricle: The cavity size was moderately dilated. Wall   thickness was increased in a pattern of mild LVH. Systolic   function was severely reduced. The estimated ejection fraction   was in the range of 20% to 25%. Diffuse hypokinesis. The study is   not technically sufficient to allow evaluation of LV diastolic   function. - Aortic valve: Trileaflet; mildly thickened, mildly calcified   leaflets. There was moderate regurgitation. - Aorta: Aortic root dimension: 40 mm (ED). - Ascending aorta: The ascending aorta was mildly dilated. - Mitral valve: Mitral valve repair, annuloplasty ring. There was   mild to moderate regurgitation directed eccentrically. - Left atrium: The atrium was moderately dilated. Volume/bsa, ES,   (1-plane Simpson&'s, A2C): 44.6 ml/m^2. - Right ventricle: The cavity size was mildly dilated. Wall   thickness was normal. - Right atrium: The atrium was moderately dilated. - Tricuspid valve: There was moderate regurgitation. - Pulmonic valve: There was moderate  regurgitation. - Pulmonary arteries: Systolic pressure was mildly increased. PA   peak pressure: 39 mm Hg (S). - Pericardium, extracardiac: A trivial pericardial effusion was   identified. There was a left pleural effusion.  Laboratory Data:  Chemistry Recent Labs  Lab 11/17/18 0118 11/17/18 0329  NA 141 142  K 3.4* 3.3*  CL 107 108  CO2 23 19*  GLUCOSE 111* 115*  BUN 27* 29*  CREATININE 1.50* 1.58*  CALCIUM 8.8* 8.7*  GFRNONAA 40* 38*  GFRAA 47* 44*  ANIONGAP 11 15    No results for input(s): PROT, ALBUMIN, AST, ALT, ALKPHOS, BILITOT in the last 168 hours. Hematology Recent Labs  Lab 11/17/18 0118  WBC 7.4  RBC 4.85  HGB 15.4  HCT 49.0  MCV 101.0*  MCH 31.8  MCHC 31.4  RDW 14.0  PLT 150   Cardiac Enzymes Recent Labs  Lab 11/17/18 0329  TROPONINI 0.03*   No results for input(s): TROPIPOC in the last 168 hours.  BNP Recent Labs  Lab 11/17/18 0118  BNP 3,122.2*    DDimer No results for input(s): DDIMER in the last 168 hours.  Radiology/Studies:  Dg Chest 1 View  Result Date: 11/17/2018 CLINICAL DATA:  Initial evaluation for acute hip fracture, preoperative evaluation. EXAM: CHEST  1 VIEW COMPARISON:  Prior radiograph from 08/10/2017. FINDINGS: Advanced  cardiomegaly, stable. Mediastinal silhouette within normal limits. Aortic atherosclerosis. Lungs mildly hypoinflated. Mild diffuse pulmonary interstitial congestion without frank alveolar edema. Suspected small bilateral pleural effusions. No consolidative opacity. No pneumothorax. No acute osseous finding. IMPRESSION: 1. Cardiomegaly with mild diffuse pulmonary interstitial congestion and small bilateral pleural effusions. 2. Aortic atherosclerosis. Electronically Signed   By: Jeannine Boga M.D.   On: 11/17/2018 01:20   Dg Elbow Complete Left  Result Date: 11/17/2018 CLINICAL DATA:  Initial evaluation for acute trauma, fall. EXAM: LEFT ELBOW - COMPLETE 3+ VIEW COMPARISON:  None. FINDINGS: No acute  fracture dislocation. No joint effusion. Radial head intact. Mild degenerative spurring at the medial and lateral epicondyles noted. No acute soft tissue abnormality. IMPRESSION: No acute osseous abnormality about the left elbow. Electronically Signed   By: Jeannine Boga M.D.   On: 11/17/2018 00:48   Ct Head Wo Contrast  Result Date: 11/17/2018 CLINICAL DATA:  Initial evaluation for acute trauma, fall. EXAM: CT HEAD WITHOUT CONTRAST TECHNIQUE: Contiguous axial images were obtained from the base of the skull through the vertex without intravenous contrast. COMPARISON:  Prior MRI from 08/12/2017. FINDINGS: Brain: Moderately advanced age-related cerebral atrophy with chronic small vessel ischemic disease. Superimposed small remote left cerebellar infarct. No acute intracranial hemorrhage. No acute large vessel territory infarct. No mass lesion, midline shift or mass effect. No hydrocephalus. No extra-axial fluid collection. Vascular: No hyperdense vessel. Calcified atherosclerosis present at the skull base. Skull: Scalp soft tissues within normal limits.  Calvarium intact. Sinuses/Orbits: Globes and orbital soft tissues demonstrate no acute finding. Mild mucosal thickening within the ethmoidal air cells and maxillary sinuses. Paranasal sinuses are otherwise clear. Trace bilateral mastoid effusions, of doubtful significance. Other: None. IMPRESSION: 1. No acute intracranial abnormality. 2. Moderately advanced age-related cerebral atrophy with chronic microvascular ischemic disease. Electronically Signed   By: Jeannine Boga M.D.   On: 11/17/2018 01:17   Dg Knee Complete 4 Views Left  Result Date: 11/17/2018 CLINICAL DATA:  Initial evaluation for acute trauma, fall. EXAM: LEFT KNEE - COMPLETE 4+ VIEW COMPARISON:  None. FINDINGS: No acute fracture dislocation. No joint effusion. Advanced osteoarthritic changes about the knee. No acute soft tissue abnormality. Prominent atherosclerotic change noted.  IMPRESSION: 1. No acute osseous abnormality about the left knee. 2. Advanced degenerative osteoarthrosis. Electronically Signed   By: Jeannine Boga M.D.   On: 11/17/2018 00:46   Dg Hip Unilat W Or Wo Pelvis 2-3 Views Left  Result Date: 11/17/2018 CLINICAL DATA:  Initial evaluation for acute trauma, fall. EXAM: DG HIP (WITH OR WITHOUT PELVIS) 2-3V LEFT COMPARISON:  None. FINDINGS: There is an acute minimally displaced and impacted subcapital fracture of the left femoral neck. Left femoral head remains normally position within the acetabulum. Femoral head height maintained. Bony pelvis intact. Limited views of the right hip demonstrate no acute finding. No acute soft tissue abnormality. Prominent atherosclerosis noted within the proximal thighs. Vascular clips overlie the proximal right thigh. IMPRESSION: Acute mildly displaced subcapital fracture of the left femoral neck. Electronically Signed   By: Jeannine Boga M.D.   On: 11/17/2018 00:45    Assessment and Plan:   Timothy Suarez is a 85 y.o. male w/ h/o laryngeal cancer s/p tracheostomy and uses a passy muir speaking valve to communicate, as well as h/o colon cancer, MVP w/ severe MR s/p minimally invasive mitral valve repair, nonobstructive CAD by cath at time of valve repair (50-70% LAD lesion), chronic systolic HF/DCM w/ EF of 37-62%, PAF s/p Maze procedure, chronic  anticoagulation w/ Eliquis, thoracic aortic aneurysm, second-degree AV block type I and II, RBBB, HTN, HLD, h/o CVA and mild cognitive impairment, admitted after a unwitnessed, presumed, mechanical fall resulting in left femoral neck fracture. He is being seen today for preoperative evaluation/cardiac risk assessment for hip arthoplasty, at the request of Dr. Tyrell Antonio, Internal Medicine and Dr. Erlinda Hong, Orthopedics.  Of note, pt also found to be in acute on chronic systolic CHF based on CXR and elevated BNP.   1. Left Femoral Neck Fx: secondary to mechanical fall. Tentatively  scheduled to go to OR tomorrow for surgical repair per ortho. Eliquis is on hold. H/H WNL.   2. Preoperative Evaluation: given advanced age and multiple co morbidities, including chronic systolic HF, he is at increased risk for perioperative complications, however morbidity from hip fracture not treated surgically is also high. MD to formally see and will provide final assessment and recs.   3. A/C Systolic CHF: EF 20-10%. Appears to be in acute CHF based on elevated BNP at 3,122 and CXR showing  pulmonary interstitial congestion and small bilateral pleural effusions. Agree w/ IV lasix for diuresis. Monitor UOP, renal function, electrolytes and BP. Give supplemental K for hypokalemia. He was hypokalemic on admit, at 3.4. Persistent today at 3.3. SBP in the 140s-150s. Per review of home med list, he was not on guidelines medical therapy for systolic HF. If renal function improves w/ diuresis, will need to consider addition of an ACE/ARB/Entresto +/- spironolactone, post operatively. Also judicious use of IVFs during the perioperative period.   3. Atrial Fibrillation: monitor ventricular rates closely during the perioperative period on tele. Eliquis is on hold in anticipation for surgery. He does have a h/o CVA. Will need to resume Eliquis as soon as safe to do so from a surgical standpoint.   4. H/o MVP w/ Severe MR: s/p repair.  5. CAD: moderate but nonobstructive CAD on cath done just prior to MV repair. A 50-70% LAD lesion was noted at that time.   6. Renal Insuffiencey: SCr 1.5 on admit. Up from 1.15 1 yr ago. Monitor closely while diuresing.   7. Hypokalemia: 3.4 on admit. 3.3 today. Give supplemental K while diuresing. If renal function improves, consider addition of ACE/ARB/Entresto +/- spironolactone, given chronic systolic CHF, post operatively.   Dr. Debara Pickett will see pt and will provide additional rec.     For questions or updates, please contact [place] Please consult  [email or web address] for contact info under     Signed, Lyda Jester, PA-C  11/17/2018 8:57 AM

## 2018-11-17 NOTE — Progress Notes (Addendum)
PROGRESS NOTE    Timothy Suarez  KNL:[id number] DOB: June 28, 1925 DOA: 11/16/2018 PCP: Timothy Curry, DO   Brief Narrative: Timothy Suarez is a 85 y.o. male with medical history significant of CKD stage III, nonobstructive CAD, chronic systolic CHF with dilated cardiomyopathy (EF 20 to 25%), second-degree AV block type I and II, mitral valve prolapse with severe mitral regurgitation status post repair, paroxysmal atrial fibrillation, thoracic aortic aneurysm, hypertension, hyperlipidemia, mild cognitive impairment, CVA, colon cancer, vocal cord cancer status post tracheostomy presenting to the hospital via EMS for evaluation of an unwitnessed fall.   Assessment & Plan:   Principal Problem:   Hip fracture ([place]) Active Problems:   Paroxysmal atrial fibrillation (HCC)   Chronic systolic congestive heart failure ([place])   Fall   AKI (acute kidney injury) ([place])  Left femoral neck fracture, sub capital ;  Presume mechanical fall.  Poor historian.  Pain medication available.  Orthopedic consulted.  Cardiology consulted, pre-op evaluation and management of heart failure.   Acute systolic Heart failure exacerbation. EF 20--25 % Chest x ray with pulmonary edema, BNP elevated.  Started on IV lasix.  Cardiology consulted.   AKI on CKD stage III Prior cr 1.1 Cr on admission elevated at 1.5, monitor on IV lasix.  Might be related to heart failure.   Unwitnessed Fall;  Patient is confuse, he is not able to provide accurate history. Presume mechanical fall.   Paroxysmal A fib;  Holding eliquis.  Will defer to cardiology starting bridge with heparin.  Does not appears to be on rate controlled medications.    Hypokalemia;  Replete orally.    Vocal cord cancer S/P tracheostomy.  tracheostomy in place.    Estimated body mass index is 28.73 kg/m as calculated from the following:   Height as of 05/02/18: 5\' 6"  (1.676 m).   Weight as of 05/02/18: 80.7 kg.   DVT prophylaxis:  SCD Code Status: full code Family Communication: I called daughter and inform her of patient admission and plan of care; orthopoedic consult, cardiology consult.  Disposition Plan: remain in the hospital.   Consultants:   Cardiology  Orthopedic.    Procedures:   none   Antimicrobials:   none   Subjective: He use passy muir valve  speak. Difficult to understand, he denies dyspnea, chest pain.  He appears some what confuse.   Objective: Vitals:   11/16/18 2315 11/17/18 0200 11/17/18 0307 11/17/18 0700  BP: (!) 152/96 117/87 140/79   Pulse: 85 (!) 101 (!) 120 84  Resp: 15 (!) 21 19   Temp:   97.9 F (36.6 C)   TempSrc:   Axillary   SpO2: 95% 94% 97%     Intake/Output Summary (Last 24 hours) at 11/17/2018 0807 Last data filed at 11/17/2018 7902 Gross per 24 hour  Intake 0 ml  Output 0 ml  Net 0 ml   There were no vitals filed for this visit.  Examination:  General exam: Appears calm and comfortable  Respiratory system: Tracheostomy , crackles bases Cardiovascular system: S1 & S2 heard, RRR. Positive JVD Gastrointestinal system: Abdomen is nondistended, soft and nontender. No organomegaly or masses felt. Normal bowel sounds heard. Central nervous system: Alert and oriented. No focal neurological deficits. Extremities:  Left LE with edema, knee immobilizer. Plus 2 edema.  Skin: No rashes, lesions or ulcers    Data Reviewed: I have personally reviewed following labs and imaging studies  CBC: Recent Labs  Lab 11/17/18 0118  WBC 7.4  NEUTROABS 5.6  HGB 15.4  HCT 49.0  MCV 101.0*  PLT 099   Basic Metabolic Panel: Recent Labs  Lab 11/17/18 0118 11/17/18 0329  NA 141 142  K 3.4* 3.3*  CL 107 108  CO2 23 19*  GLUCOSE 111* 115*  BUN 27* 29*  CREATININE 1.50* 1.58*  CALCIUM 8.8* 8.7*   GFR: CrCl cannot be calculated (Unknown ideal weight.). Liver Function Tests: No results for input(s): AST, ALT, ALKPHOS, BILITOT, PROT, ALBUMIN in the last 168  hours. No results for input(s): LIPASE, AMYLASE in the last 168 hours. No results for input(s): AMMONIA in the last 168 hours. Coagulation Profile: Recent Labs  Lab 11/17/18 0118  INR 1.4*   Cardiac Enzymes: Recent Labs  Lab 11/17/18 0329  TROPONINI 0.03*   BNP (last 3 results) No results for input(s): PROBNP in the last 8760 hours. HbA1C: No results for input(s): HGBA1C in the last 72 hours. CBG: No results for input(s): GLUCAP in the last 168 hours. Lipid Profile: No results for input(s): CHOL, HDL, LDLCALC, TRIG, CHOLHDL, LDLDIRECT in the last 72 hours. Thyroid Function Tests: No results for input(s): TSH, T4TOTAL, FREET4, T3FREE, THYROIDAB in the last 72 hours. Anemia Panel: No results for input(s): VITAMINB12, FOLATE, FERRITIN, TIBC, IRON, RETICCTPCT in the last 72 hours. Sepsis Labs: No results for input(s): PROCALCITON, LATICACIDVEN in the last 168 hours.  Recent Results (from the past 240 hour(s))  Surgical pcr screen     Status: None   Collection Time: 11/17/18  3:09 AM  Result Value Ref Range Status   MRSA, PCR NEGATIVE NEGATIVE Final   Staphylococcus aureus NEGATIVE NEGATIVE Final    Comment: (NOTE) The Xpert SA Assay (FDA approved for NASAL specimens in patients 37 years of age and older), is one component of a comprehensive surveillance program. It is not intended to diagnose infection nor to guide or monitor treatment. Performed at [facility], [place] [address], [place], [place] [address]          Radiology Studies: Dg Chest 1 View  Result Date: 11/17/2018 CLINICAL DATA:  Initial evaluation for acute hip fracture, preoperative evaluation. EXAM: CHEST  1 VIEW COMPARISON:  Prior radiograph from 08/10/2017. FINDINGS: Advanced cardiomegaly, stable. Mediastinal silhouette within normal limits. Aortic atherosclerosis. Lungs mildly hypoinflated. Mild diffuse pulmonary interstitial congestion without frank alveolar edema. Suspected small bilateral  pleural effusions. No consolidative opacity. No pneumothorax. No acute osseous finding. IMPRESSION: 1. Cardiomegaly with mild diffuse pulmonary interstitial congestion and small bilateral pleural effusions. 2. Aortic atherosclerosis. Electronically Signed   By: Jeannine Boga M.D.   On: 11/17/2018 01:20   Dg Elbow Complete Left  Result Date: 11/17/2018 CLINICAL DATA:  Initial evaluation for acute trauma, fall. EXAM: LEFT ELBOW - COMPLETE 3+ VIEW COMPARISON:  None. FINDINGS: No acute fracture dislocation. No joint effusion. Radial head intact. Mild degenerative spurring at the medial and lateral epicondyles noted. No acute soft tissue abnormality. IMPRESSION: No acute osseous abnormality about the left elbow. Electronically Signed   By: Jeannine Boga M.D.   On: 11/17/2018 00:48   Ct Head Wo Contrast  Result Date: 11/17/2018 CLINICAL DATA:  Initial evaluation for acute trauma, fall. EXAM: CT HEAD WITHOUT CONTRAST TECHNIQUE: Contiguous axial images were obtained from the base of the skull through the vertex without intravenous contrast. COMPARISON:  Prior MRI from 08/12/2017. FINDINGS: Brain: Moderately advanced age-related cerebral atrophy with chronic small vessel ischemic disease. Superimposed small remote left cerebellar infarct. No acute intracranial hemorrhage. No acute large vessel  territory infarct. No mass lesion, midline shift or mass effect. No hydrocephalus. No extra-axial fluid collection. Vascular: No hyperdense vessel. Calcified atherosclerosis present at the skull base. Skull: Scalp soft tissues within normal limits.  Calvarium intact. Sinuses/Orbits: Globes and orbital soft tissues demonstrate no acute finding. Mild mucosal thickening within the ethmoidal air cells and maxillary sinuses. Paranasal sinuses are otherwise clear. Trace bilateral mastoid effusions, of doubtful significance. Other: None. IMPRESSION: 1. No acute intracranial abnormality. 2. Moderately advanced age-related  cerebral atrophy with chronic microvascular ischemic disease. Electronically Signed   By: Jeannine Boga M.D.   On: 11/17/2018 01:17   Dg Knee Complete 4 Views Left  Result Date: 11/17/2018 CLINICAL DATA:  Initial evaluation for acute trauma, fall. EXAM: LEFT KNEE - COMPLETE 4+ VIEW COMPARISON:  None. FINDINGS: No acute fracture dislocation. No joint effusion. Advanced osteoarthritic changes about the knee. No acute soft tissue abnormality. Prominent atherosclerotic change noted. IMPRESSION: 1. No acute osseous abnormality about the left knee. 2. Advanced degenerative osteoarthrosis. Electronically Signed   By: Jeannine Boga M.D.   On: 11/17/2018 00:46   Dg Hip Unilat W Or Wo Pelvis 2-3 Views Left  Result Date: 11/17/2018 CLINICAL DATA:  Initial evaluation for acute trauma, fall. EXAM: DG HIP (WITH OR WITHOUT PELVIS) 2-3V LEFT COMPARISON:  None. FINDINGS: There is an acute minimally displaced and impacted subcapital fracture of the left femoral neck. Left femoral head remains normally position within the acetabulum. Femoral head height maintained. Bony pelvis intact. Limited views of the right hip demonstrate no acute finding. No acute soft tissue abnormality. Prominent atherosclerosis noted within the proximal thighs. Vascular clips overlie the proximal right thigh. IMPRESSION: Acute mildly displaced subcapital fracture of the left femoral neck. Electronically Signed   By: Jeannine Boga M.D.   On: 11/17/2018 00:45        Scheduled Meds:  docusate sodium  100 mg Oral BID   furosemide  40 mg Intravenous Q12H   potassium chloride  40 mEq Oral Once   Continuous Infusions:   LOS: 0 days    Time spent:35 minutes     Elmarie Shiley, MD [facility] Pager [phone]  If 7PM-7AM, please contact night-coverage [email or web address] Password [email or web address] 11/17/2018, 8:07 AM

## 2018-11-18 ENCOUNTER — Encounter (HOSPITAL_COMMUNITY)
Admission: EM | Disposition: A | Discharge status: Skilled Nursing Facility | Payer: Self-pay | Source: Skilled Nursing Facility | Attending: Internal Medicine

## 2018-11-18 ENCOUNTER — Inpatient Hospital Stay (HOSPITAL_COMMUNITY): Payer: Medicare Other

## 2018-11-18 ENCOUNTER — Inpatient Hospital Stay (HOSPITAL_COMMUNITY): Payer: Medicare Other | Admitting: Anesthesiology

## 2018-11-18 DIAGNOSIS — I5022 Chronic systolic (congestive) heart failure: Secondary | ICD-10-CM

## 2018-11-18 HISTORY — PX: TOTAL HIP ARTHROPLASTY: SHX124

## 2018-11-18 LAB — CBC
HCT: 44.4 % (ref 39.0–52.0)
Hemoglobin: 14.4 g/dL (ref 13.0–17.0)
MCH: 31.7 pg (ref 26.0–34.0)
MCHC: 32.4 g/dL (ref 30.0–36.0)
MCV: 97.8 fL (ref 80.0–100.0)
Platelets: 126 10*3/uL — ABNORMAL LOW (ref 150–400)
RBC: 4.54 MIL/uL (ref 4.22–5.81)
RDW: 14 % (ref 11.5–15.5)
WBC: 8.5 10*3/uL (ref 4.0–10.5)
nRBC: 0 % (ref 0.0–0.2)

## 2018-11-18 LAB — BASIC METABOLIC PANEL
Anion gap: 10 (ref 5–15)
BUN: 21 mg/dL (ref 8–23)
CO2: 22 mmol/L (ref 22–32)
Calcium: 8.6 mg/dL — ABNORMAL LOW (ref 8.9–10.3)
Chloride: 111 mmol/L (ref 98–111)
Creatinine, Ser: 1.34 mg/dL — ABNORMAL HIGH (ref 0.61–1.24)
GFR calc Af Amer: 53 mL/min — ABNORMAL LOW (ref >60)
GFR calc non Af Amer: 46 mL/min — ABNORMAL LOW (ref >60)
Glucose, Bld: 171 mg/dL — ABNORMAL HIGH (ref 70–99)
Potassium: 3.4 mmol/L — ABNORMAL LOW (ref 3.5–5.1)
Sodium: 143 mmol/L (ref 135–145)

## 2018-11-18 SURGERY — ARTHROPLASTY, HIP, TOTAL, ANTERIOR APPROACH
Anesthesia: General | Laterality: Left

## 2018-11-18 MED ORDER — SORBITOL 70 % SOLN
30.0000 mL | Freq: Every day | Status: DC | PRN
  Filled 2018-11-18: qty 30

## 2018-11-18 MED ORDER — PROPOFOL 10 MG/ML IV BOLUS
INTRAVENOUS | Status: AC
  Filled 2018-11-18: qty 20

## 2018-11-18 MED ORDER — DEXAMETHASONE SODIUM PHOSPHATE 10 MG/ML IJ SOLN
INTRAMUSCULAR | Status: DC | PRN
  Administered 2018-11-18: 10 mg via INTRAVENOUS

## 2018-11-18 MED ORDER — ACETAMINOPHEN 10 MG/ML IV SOLN
INTRAVENOUS | Status: DC | PRN
  Administered 2018-11-18: 1000 mg via INTRAVENOUS

## 2018-11-18 MED ORDER — SODIUM CHLORIDE 0.9 % IV SOLN
INTRAVENOUS | Status: DC | PRN
  Administered 2018-11-18: 08:00:00 30 ug/min via INTRAVENOUS

## 2018-11-18 MED ORDER — LACTATED RINGERS IV SOLN
INTRAVENOUS | Status: DC | PRN
  Administered 2018-11-18: 07:00:00 via INTRAVENOUS

## 2018-11-18 MED ORDER — SUGAMMADEX SODIUM 200 MG/2ML IV SOLN
INTRAVENOUS | Status: DC | PRN
  Administered 2018-11-18: 200 mg via INTRAVENOUS

## 2018-11-18 MED ORDER — ROCURONIUM BROMIDE 10 MG/ML (PF) SYRINGE
PREFILLED_SYRINGE | INTRAVENOUS | Status: DC | PRN
  Administered 2018-11-18: 10 mg via INTRAVENOUS
  Administered 2018-11-18: 40 mg via INTRAVENOUS

## 2018-11-18 MED ORDER — CEFAZOLIN SODIUM 1 G IJ SOLR
INTRAMUSCULAR | Status: AC
  Filled 2018-11-18: qty 20

## 2018-11-18 MED ORDER — ACETAMINOPHEN 500 MG PO TABS
500.0000 mg | ORAL_TABLET | Freq: Four times a day (QID) | ORAL | Status: AC
  Administered 2018-11-18 – 2018-11-19 (×2): 500 mg via ORAL
  Filled 2018-11-18 (×2): qty 1

## 2018-11-18 MED ORDER — DOCUSATE SODIUM 100 MG PO CAPS
100.0000 mg | ORAL_CAPSULE | Freq: Two times a day (BID) | ORAL | Status: DC
  Administered 2018-11-18 – 2018-11-23 (×8): 100 mg via ORAL
  Filled 2018-11-18 (×9): qty 1

## 2018-11-18 MED ORDER — EPHEDRINE SULFATE-NACL 50-0.9 MG/10ML-% IV SOSY
PREFILLED_SYRINGE | INTRAVENOUS | Status: DC | PRN
  Administered 2018-11-18: 2.5 mg via INTRAVENOUS

## 2018-11-18 MED ORDER — LIDOCAINE 2% (20 MG/ML) 5 ML SYRINGE
INTRAMUSCULAR | Status: DC | PRN
  Administered 2018-11-18 (×2): 40 mg via INTRAVENOUS

## 2018-11-18 MED ORDER — VANCOMYCIN HCL 1 G IV SOLR
INTRAVENOUS | Status: DC | PRN
  Administered 2018-11-18: 1000 mg

## 2018-11-18 MED ORDER — SODIUM CHLORIDE 0.9 % IV SOLN: INTRAVENOUS | Status: DC

## 2018-11-18 MED ORDER — SODIUM CHLORIDE 0.9 % IR SOLN
Status: DC | PRN
  Administered 2018-11-18: 3000 mL

## 2018-11-18 MED ORDER — FUROSEMIDE 10 MG/ML IJ SOLN: 40.0000 mg | Freq: Two times a day (BID) | INTRAMUSCULAR | Status: DC

## 2018-11-18 MED ORDER — MAGNESIUM CITRATE PO SOLN: 1.0000 | Freq: Once | ORAL | Status: DC | PRN

## 2018-11-18 MED ORDER — MORPHINE SULFATE (PF) 2 MG/ML IV SOLN: 1.0000 mg | INTRAVENOUS | Status: DC | PRN

## 2018-11-18 MED ORDER — FENTANYL CITRATE (PF) 250 MCG/5ML IJ SOLN
INTRAMUSCULAR | Status: AC
  Filled 2018-11-18: qty 5

## 2018-11-18 MED ORDER — VANCOMYCIN HCL 1000 MG IV SOLR
INTRAVENOUS | Status: AC
  Filled 2018-11-18: qty 1000

## 2018-11-18 MED ORDER — ONDANSETRON HCL 4 MG/2ML IJ SOLN
INTRAMUSCULAR | Status: AC
  Filled 2018-11-18: qty 2

## 2018-11-18 MED ORDER — DEXAMETHASONE SODIUM PHOSPHATE 10 MG/ML IJ SOLN
INTRAMUSCULAR | Status: AC
  Filled 2018-11-18: qty 1

## 2018-11-18 MED ORDER — CEFAZOLIN SODIUM-DEXTROSE 2-4 GM/100ML-% IV SOLN
2.0000 g | Freq: Four times a day (QID) | INTRAVENOUS | Status: AC
  Administered 2018-11-18 – 2018-11-19 (×3): 2 g via INTRAVENOUS
  Filled 2018-11-18 (×3): qty 100

## 2018-11-18 MED ORDER — ONDANSETRON HCL 4 MG/2ML IJ SOLN
INTRAMUSCULAR | Status: DC | PRN
  Administered 2018-11-18: 4 mg via INTRAVENOUS

## 2018-11-18 MED ORDER — HYDROCODONE-ACETAMINOPHEN 5-325 MG PO TABS: 1.0000 | ORAL_TABLET | Freq: Four times a day (QID) | ORAL | 0 refills | Status: AC | PRN

## 2018-11-18 MED ORDER — METHOCARBAMOL 1000 MG/10ML IJ SOLN
500.0000 mg | Freq: Four times a day (QID) | INTRAVENOUS | Status: DC | PRN
  Administered 2018-11-18: 500 mg via INTRAVENOUS
  Filled 2018-11-18 (×2): qty 5

## 2018-11-18 MED ORDER — ACETAMINOPHEN 325 MG PO TABS
325.0000 mg | ORAL_TABLET | Freq: Four times a day (QID) | ORAL | Status: DC | PRN
  Administered 2018-11-19 – 2018-11-20 (×2): 650 mg via ORAL
  Filled 2018-11-18 (×2): qty 2

## 2018-11-18 MED ORDER — ALUM & MAG HYDROXIDE-SIMETH 200-200-20 MG/5ML PO SUSP: 30.0000 mL | ORAL | Status: DC | PRN

## 2018-11-18 MED ORDER — METHOCARBAMOL 500 MG PO TABS
500.0000 mg | ORAL_TABLET | Freq: Four times a day (QID) | ORAL | Status: DC | PRN
  Administered 2018-11-20: 500 mg via ORAL
  Filled 2018-11-18: qty 1

## 2018-11-18 MED ORDER — PHENYLEPHRINE 40 MCG/ML (10ML) SYRINGE FOR IV PUSH (FOR BLOOD PRESSURE SUPPORT)
PREFILLED_SYRINGE | INTRAVENOUS | Status: DC | PRN
  Administered 2018-11-18 (×2): 80 ug via INTRAVENOUS
  Administered 2018-11-18: 40 ug via INTRAVENOUS
  Administered 2018-11-18 (×2): 80 ug via INTRAVENOUS

## 2018-11-18 MED ORDER — SUCCINYLCHOLINE CHLORIDE 200 MG/10ML IV SOSY
PREFILLED_SYRINGE | INTRAVENOUS | Status: AC
  Filled 2018-11-18: qty 10

## 2018-11-18 MED ORDER — PHENOL 1.4 % MT LIQD: 1.0000 | OROMUCOSAL | Status: DC | PRN

## 2018-11-18 MED ORDER — ETOMIDATE 2 MG/ML IV SOLN
INTRAVENOUS | Status: DC | PRN
  Administered 2018-11-18: 14 mg via INTRAVENOUS

## 2018-11-18 MED ORDER — POLYETHYLENE GLYCOL 3350 17 G PO PACK: 17.0000 g | PACK | Freq: Every day | ORAL | Status: DC | PRN

## 2018-11-18 MED ORDER — MENTHOL 3 MG MT LOZG: 1.0000 | LOZENGE | OROMUCOSAL | Status: DC | PRN

## 2018-11-18 MED ORDER — APIXABAN 5 MG PO TABS
5.0000 mg | ORAL_TABLET | Freq: Two times a day (BID) | ORAL | Status: DC
  Administered 2018-11-19 – 2018-11-23 (×9): 5 mg via ORAL
  Filled 2018-11-18 (×9): qty 1

## 2018-11-18 MED ORDER — ACETAMINOPHEN 10 MG/ML IV SOLN
INTRAVENOUS | Status: AC
  Filled 2018-11-18: qty 100

## 2018-11-18 MED ORDER — ENSURE PRE-SURGERY PO LIQD
296.0000 mL | Freq: Once | ORAL | Status: AC
  Administered 2018-11-18: 296 mL via ORAL
  Filled 2018-11-18: qty 296

## 2018-11-18 MED ORDER — POTASSIUM CHLORIDE CRYS ER 20 MEQ PO TBCR
40.0000 meq | EXTENDED_RELEASE_TABLET | Freq: Once | ORAL | Status: DC
  Filled 2018-11-18: qty 2

## 2018-11-18 MED ORDER — LIDOCAINE 2% (20 MG/ML) 5 ML SYRINGE
INTRAMUSCULAR | Status: AC
  Filled 2018-11-18: qty 5

## 2018-11-18 MED ORDER — ETOMIDATE 2 MG/ML IV SOLN
INTRAVENOUS | Status: AC
  Filled 2018-11-18: qty 10

## 2018-11-18 MED ORDER — EPHEDRINE 5 MG/ML INJ
INTRAVENOUS | Status: AC
  Filled 2018-11-18: qty 10

## 2018-11-18 MED ORDER — HYDROCODONE-ACETAMINOPHEN 7.5-325 MG PO TABS: 1.0000 | ORAL_TABLET | ORAL | Status: DC | PRN

## 2018-11-18 MED ORDER — FENTANYL CITRATE (PF) 100 MCG/2ML IJ SOLN: 25.0000 ug | INTRAMUSCULAR | Status: DC | PRN

## 2018-11-18 MED ORDER — HYDROCODONE-ACETAMINOPHEN 5-325 MG PO TABS: 1.0000 | ORAL_TABLET | ORAL | Status: DC | PRN

## 2018-11-18 MED ORDER — FENTANYL CITRATE (PF) 250 MCG/5ML IJ SOLN
INTRAMUSCULAR | Status: DC | PRN
  Administered 2018-11-18 (×3): 25 ug via INTRAVENOUS

## 2018-11-18 MED ORDER — PHENYLEPHRINE 40 MCG/ML (10ML) SYRINGE FOR IV PUSH (FOR BLOOD PRESSURE SUPPORT)
PREFILLED_SYRINGE | INTRAVENOUS | Status: AC
  Filled 2018-11-18: qty 10

## 2018-11-18 MED ORDER — ROCURONIUM BROMIDE 50 MG/5ML IV SOSY
PREFILLED_SYRINGE | INTRAVENOUS | Status: AC
  Filled 2018-11-18: qty 5

## 2018-11-18 MED ORDER — 0.9 % SODIUM CHLORIDE (POUR BTL) OPTIME
TOPICAL | Status: DC | PRN
  Administered 2018-11-18: 09:00:00 1000 mL

## 2018-11-18 SURGICAL SUPPLY — 61 items
BAG DECANTER FOR FLEXI CONT (MISCELLANEOUS) ×3 IMPLANT
BIPOLAR PROS AML 55 (Hips) ×2 IMPLANT
BIPOLAR PROS AML 55MM (Hips) ×1 IMPLANT
CELLS DAT CNTRL 66122 CELL SVR (MISCELLANEOUS) IMPLANT
COVER PERINEAL POST (MISCELLANEOUS) ×3 IMPLANT
COVER SURGICAL LIGHT HANDLE (MISCELLANEOUS) ×3 IMPLANT
COVER WAND RF STERILE (DRAPES) ×3 IMPLANT
DRAPE C-ARM 42X72 X-RAY (DRAPES) ×3 IMPLANT
DRAPE POUCH INSTRU U-SHP 10X18 (DRAPES) ×3 IMPLANT
DRAPE STERI IOBAN 125X83 (DRAPES) ×3 IMPLANT
DRAPE U-SHAPE 47X51 STRL (DRAPES) ×6 IMPLANT
DRSG AQUACEL AG ADV 3.5X10 (GAUZE/BANDAGES/DRESSINGS) ×3 IMPLANT
DURAPREP 26ML APPLICATOR (WOUND CARE) ×6 IMPLANT
ELECT BLADE 4.0 EZ CLEAN MEGAD (MISCELLANEOUS) ×3
ELECT REM PT RETURN 9FT ADLT (ELECTROSURGICAL) ×3
ELECTRODE BLDE 4.0 EZ CLN MEGD (MISCELLANEOUS) ×1 IMPLANT
ELECTRODE REM PT RTRN 9FT ADLT (ELECTROSURGICAL) ×1 IMPLANT
GLOVE BIOGEL PI IND STRL 7.0 (GLOVE) ×1 IMPLANT
GLOVE BIOGEL PI INDICATOR 7.0 (GLOVE) ×2
GLOVE ECLIPSE 7.0 STRL STRAW (GLOVE) ×8 IMPLANT
GLOVE SKINSENSE NS SZ7.5 (GLOVE) ×2
GLOVE SKINSENSE STRL SZ7.5 (GLOVE) ×1 IMPLANT
GLOVE SURG SYN 7.5  E (GLOVE) ×8
GLOVE SURG SYN 7.5 E (GLOVE) ×4 IMPLANT
GLOVE SURG SYN 7.5 PF PI (GLOVE) ×4 IMPLANT
GOWN SRG XL XLNG 56XLVL 4 (GOWN DISPOSABLE) ×1 IMPLANT
GOWN STRL NON-REIN XL XLG LVL4 (GOWN DISPOSABLE) ×1
GOWN STRL REUS W/ TWL LRG LVL3 (GOWN DISPOSABLE) IMPLANT
GOWN STRL REUS W/ TWL XL LVL3 (GOWN DISPOSABLE) ×1 IMPLANT
GOWN STRL REUS W/TWL LRG LVL3 (GOWN DISPOSABLE)
GOWN STRL REUS W/TWL XL LVL3 (GOWN DISPOSABLE) ×2
HANDPIECE INTERPULSE COAX TIP (DISPOSABLE) ×2
HEAD BIPOLAR PROS AML 55 (Hips) IMPLANT
HEAD FEM STD 28X+1.5 STRL (Hips) ×2 IMPLANT
HOOD PEEL AWAY FLYTE STAYCOOL (MISCELLANEOUS) ×6 IMPLANT
IV NS IRRIG 3000ML ARTHROMATIC (IV SOLUTION) ×3 IMPLANT
KIT BASIN OR (CUSTOM PROCEDURE TRAY) ×3 IMPLANT
MARKER SKIN DUAL TIP RULER LAB (MISCELLANEOUS) ×3 IMPLANT
NDL SPNL 18GX3.5 QUINCKE PK (NEEDLE) ×1 IMPLANT
NEEDLE SPNL 18GX3.5 QUINCKE PK (NEEDLE) ×3 IMPLANT
PACK TOTAL JOINT (CUSTOM PROCEDURE TRAY) ×3 IMPLANT
PACK UNIVERSAL I (CUSTOM PROCEDURE TRAY) ×3 IMPLANT
RETRACTOR WND ALEXIS 18 MED (MISCELLANEOUS) IMPLANT
RTRCTR WOUND ALEXIS 18CM MED (MISCELLANEOUS)
SAW OSC TIP CART 19.5X105X1.3 (SAW) ×3 IMPLANT
SET HNDPC FAN SPRY TIP SCT (DISPOSABLE) ×1 IMPLANT
STAPLER VISISTAT 35W (STAPLE) IMPLANT
STEM CORAIL KA13 (Stem) ×2 IMPLANT
SUT ETHIBOND 2 V 37 (SUTURE) ×3 IMPLANT
SUT ETHILON 2 0 FS 18 (SUTURE) ×6 IMPLANT
SUT VIC AB 0 CT1 27 (SUTURE) ×4
SUT VIC AB 0 CT1 27XBRD ANBCTR (SUTURE) IMPLANT
SUT VIC AB 1 CT1 27 (SUTURE) ×2
SUT VIC AB 1 CT1 27XBRD ANBCTR (SUTURE) ×1 IMPLANT
SUT VIC AB 2-0 CT1 27 (SUTURE) ×4
SUT VIC AB 2-0 CT1 TAPERPNT 27 (SUTURE) ×2 IMPLANT
SYR 50ML LL SCALE MARK (SYRINGE) ×2 IMPLANT
SYRINGE 60CC LL (MISCELLANEOUS) ×2 IMPLANT
TOWEL OR 17X26 10 PK STRL BLUE (TOWEL DISPOSABLE) ×3 IMPLANT
TRAY CATH 16FR W/PLASTIC CATH (SET/KITS/TRAYS/PACK) IMPLANT
YANKAUER SUCT BULB TIP NO VENT (SUCTIONS) ×3 IMPLANT

## 2018-11-18 NOTE — Progress Notes (Signed)
PROGRESS NOTE    Timothy Suarez  NID:[id number] DOB: [birth year] DOA: 11/16/2018 PCP: Gayland Curry, DO   Brief Narrative: Timothy Suarez is a 85 y.o. male with medical history significant of CKD stage III, nonobstructive CAD, chronic systolic CHF with dilated cardiomyopathy (EF 20 to 25%), second-degree AV block type I and II, mitral valve prolapse with severe mitral regurgitation status post repair, paroxysmal atrial fibrillation, thoracic aortic aneurysm, hypertension, hyperlipidemia, mild cognitive impairment, CVA, colon cancer, vocal cord cancer status post tracheostomy presenting to the hospital via EMS for evaluation of an unwitnessed fall.   Assessment & Plan:   Principal Problem:   Closed displaced fracture of left femoral neck (HCC) Active Problems:   Paroxysmal atrial fibrillation (HCC)   Chronic systolic congestive heart failure ([place])   Fall   AKI (acute kidney injury) ([place])  Left femoral neck fracture, sub capital ;  Presume mechanical fall.  Poor historian.  Pain medication available.  Orthopedic consulted.  Cardiology consulted, pre-op evaluation and management of heart failure.  Appreciate cardio evaluation.  Patient underwent sx prosthetic replacement for femoral neck fracture.   Acute systolic Heart failure exacerbation. EF 20--25 % Chest x ray with pulmonary edema, BNP elevated.  Will skip night dose of lasix IV for tonight, patient probably wont be able to drink fluids today. Would avoid IV fluids. Resume IV lasix tomorrow. He received a dose of IV lasix today Cardiology consulted.   AKI on CKD stage III Prior cr 1.1 Cr on admission elevated at 1.5, monitor on IV lasix.  Might be related to heart failure.  Improved on lasix.   Unwitnessed Fall;  Patient is confuse, he is not able to provide accurate history. Presume mechanical fall.   Paroxysmal A fib;  Holding eliquis.  Does not appears to be on rate controlled medications.  Resume eliquis  when ok by Ortho.    Hypokalemia;  Replete orally.    Vocal cord cancer S/P tracheostomy.  tracheostomy in place.   Thrombocytopenia; mild monitor.   Estimated body mass index is 27.72 kg/m as calculated from the following:   Height as of 05/02/18: 5\' 6"  (1.676 m).   Weight as of this encounter: 77.9 kg.   DVT prophylaxis: SCD Code Status: full code Family Communication: I called daughter and inform her of patient admission and plan of care; orthopoedic consult, cardiology consult.  Disposition Plan: remain in the hospital.   Consultants:   Cardiology  Orthopedic.    Procedures:   none   Antimicrobials:   none   Subjective: He is sedated, seen post sx. Moves arm passively.   Objective: Vitals:   11/18/18 1025 11/18/18 1040 11/18/18 1055 11/18/18 1110  BP: (!) 100/51 (!) 95/51 (!) 93/49 (!) 99/54  Pulse: (!) 58 61 (!) 59 72  Resp: 18 19 19 18   Temp:    97.7 F (36.5 C)  TempSrc:      SpO2: 95% 96% 97% 98%  Weight:        Intake/Output Summary (Last 24 hours) at 11/18/2018 1209 Last data filed at 11/18/2018 0957 Gross per 24 hour  Intake 1036 ml  Output 2490 ml  Net -1454 ml   Filed Weights   11/18/18 0500  Weight: 77.9 kg    Examination:  General exam: NAD Respiratory system: Tracheostomy , CTA Cardiovascular system: S 1 , S 2 RRR Gastrointestinal system: BS present, soft, nt Central nervous system: sleepy  Extremities: trace edema Skin: No rashes, lesions or ulcers  Data Reviewed: I have personally reviewed following labs and imaging studies  CBC: Recent Labs  Lab 11/17/18 0118 11/18/18 0343  WBC 7.4 8.5  NEUTROABS 5.6  --   HGB 15.4 14.4  HCT 49.0 44.4  MCV 101.0* 97.8  PLT 150 357*   Basic Metabolic Panel: Recent Labs  Lab 11/17/18 0118 11/17/18 0329 11/18/18 0343  NA 141 142 143  K 3.4* 3.3* 3.4*  CL 107 108 111  CO2 23 19* 22  GLUCOSE 111* 115* 171*  BUN 27* 29* 21  CREATININE 1.50* 1.58* 1.34*  CALCIUM 8.8*  8.7* 8.6*  MG  --  2.2  --    GFR: CrCl cannot be calculated (Unknown ideal weight.). Liver Function Tests: No results for input(s): AST, ALT, ALKPHOS, BILITOT, PROT, ALBUMIN in the last 168 hours. No results for input(s): LIPASE, AMYLASE in the last 168 hours. No results for input(s): AMMONIA in the last 168 hours. Coagulation Profile: Recent Labs  Lab 11/17/18 0118  INR 1.4*   Cardiac Enzymes: Recent Labs  Lab 11/17/18 0329  TROPONINI 0.03*   BNP (last 3 results) No results for input(s): PROBNP in the last 8760 hours. HbA1C: No results for input(s): HGBA1C in the last 72 hours. CBG: No results for input(s): GLUCAP in the last 168 hours. Lipid Profile: No results for input(s): CHOL, HDL, LDLCALC, TRIG, CHOLHDL, LDLDIRECT in the last 72 hours. Thyroid Function Tests: No results for input(s): TSH, T4TOTAL, FREET4, T3FREE, THYROIDAB in the last 72 hours. Anemia Panel: No results for input(s): VITAMINB12, FOLATE, FERRITIN, TIBC, IRON, RETICCTPCT in the last 72 hours. Sepsis Labs: No results for input(s): PROCALCITON, LATICACIDVEN in the last 168 hours.  Recent Results (from the past 240 hour(s))  Surgical pcr screen     Status: None   Collection Time: 11/17/18  3:09 AM  Result Value Ref Range Status   MRSA, PCR NEGATIVE NEGATIVE Final   Staphylococcus aureus NEGATIVE NEGATIVE Final    Comment: (NOTE) The Xpert SA Assay (FDA approved for NASAL specimens in patients 59 years of age and older), is one component of a comprehensive surveillance program. It is not intended to diagnose infection nor to guide or monitor treatment. Performed at [facility], [place] [address], [place], [place] [address]          Radiology Studies: Dg Chest 1 View  Result Date: 11/17/2018 CLINICAL DATA:  Initial evaluation for acute hip fracture, preoperative evaluation. EXAM: CHEST  1 VIEW COMPARISON:  Prior radiograph from 08/10/2017. FINDINGS: Advanced cardiomegaly, stable.  Mediastinal silhouette within normal limits. Aortic atherosclerosis. Lungs mildly hypoinflated. Mild diffuse pulmonary interstitial congestion without frank alveolar edema. Suspected small bilateral pleural effusions. No consolidative opacity. No pneumothorax. No acute osseous finding. IMPRESSION: 1. Cardiomegaly with mild diffuse pulmonary interstitial congestion and small bilateral pleural effusions. 2. Aortic atherosclerosis. Electronically Signed   By: Jeannine Boga M.D.   On: 11/17/2018 01:20   Dg Elbow Complete Left  Result Date: 11/17/2018 CLINICAL DATA:  Initial evaluation for acute trauma, fall. EXAM: LEFT ELBOW - COMPLETE 3+ VIEW COMPARISON:  None. FINDINGS: No acute fracture dislocation. No joint effusion. Radial head intact. Mild degenerative spurring at the medial and lateral epicondyles noted. No acute soft tissue abnormality. IMPRESSION: No acute osseous abnormality about the left elbow. Electronically Signed   By: Jeannine Boga M.D.   On: 11/17/2018 00:48   Ct Head Wo Contrast  Result Date: 11/17/2018 CLINICAL DATA:  Initial evaluation for acute trauma, fall. EXAM: CT HEAD WITHOUT  CONTRAST TECHNIQUE: Contiguous axial images were obtained from the base of the skull through the vertex without intravenous contrast. COMPARISON:  Prior MRI from 08/12/2017. FINDINGS: Brain: Moderately advanced age-related cerebral atrophy with chronic small vessel ischemic disease. Superimposed small remote left cerebellar infarct. No acute intracranial hemorrhage. No acute large vessel territory infarct. No mass lesion, midline shift or mass effect. No hydrocephalus. No extra-axial fluid collection. Vascular: No hyperdense vessel. Calcified atherosclerosis present at the skull base. Skull: Scalp soft tissues within normal limits.  Calvarium intact. Sinuses/Orbits: Globes and orbital soft tissues demonstrate no acute finding. Mild mucosal thickening within the ethmoidal air cells and maxillary sinuses.  Paranasal sinuses are otherwise clear. Trace bilateral mastoid effusions, of doubtful significance. Other: None. IMPRESSION: 1. No acute intracranial abnormality. 2. Moderately advanced age-related cerebral atrophy with chronic microvascular ischemic disease. Electronically Signed   By: Jeannine Boga M.D.   On: 11/17/2018 01:17   Pelvis Portable  Result Date: 11/18/2018 CLINICAL DATA:  Status post left hip replacement EXAM: PORTABLE PELVIS 1-2 VIEWS COMPARISON:  None. FINDINGS: Generalized osteopenia. Interval left hip arthroplasty without hardware failure or complication. Postsurgical changes in the surrounding soft tissues. No acute fracture or dislocation. Mild osteoarthritis of the right hip. IMPRESSION: Interval left hip arthroplasty. Electronically Signed   By: Kathreen Devoid   On: 11/18/2018 10:18   Dg Knee Complete 4 Views Left  Result Date: 11/17/2018 CLINICAL DATA:  Initial evaluation for acute trauma, fall. EXAM: LEFT KNEE - COMPLETE 4+ VIEW COMPARISON:  None. FINDINGS: No acute fracture dislocation. No joint effusion. Advanced osteoarthritic changes about the knee. No acute soft tissue abnormality. Prominent atherosclerotic change noted. IMPRESSION: 1. No acute osseous abnormality about the left knee. 2. Advanced degenerative osteoarthrosis. Electronically Signed   By: Jeannine Boga M.D.   On: 11/17/2018 00:46   Dg C-arm 1-60 Min  Result Date: 11/18/2018 CLINICAL DATA:  Left hip replacement EXAM: OPERATIVE left HIP (WITH PELVIS IF PERFORMED) 2 VIEWS TECHNIQUE: Fluoroscopic spot image(s) were submitted for interpretation post-operatively. COMPARISON:  For a 2020 FINDINGS: Bipolar hip replacement on the left. Components appear well positioned. No radiographically detectable complication. IMPRESSION: Good appearance following bipolar hip replacement on the left. Electronically Signed   By: Nelson Chimes M.D.   On: 11/18/2018 09:37   Dg Hip Operative Unilat W Or W/o Pelvis  Left  Result Date: 11/18/2018 CLINICAL DATA:  Left hip replacement EXAM: OPERATIVE left HIP (WITH PELVIS IF PERFORMED) 2 VIEWS TECHNIQUE: Fluoroscopic spot image(s) were submitted for interpretation post-operatively. COMPARISON:  For a 2020 FINDINGS: Bipolar hip replacement on the left. Components appear well positioned. No radiographically detectable complication. IMPRESSION: Good appearance following bipolar hip replacement on the left. Electronically Signed   By: Nelson Chimes M.D.   On: 11/18/2018 09:37   Dg Hip Unilat W Or Wo Pelvis 2-3 Views Left  Result Date: 11/17/2018 CLINICAL DATA:  Initial evaluation for acute trauma, fall. EXAM: DG HIP (WITH OR WITHOUT PELVIS) 2-3V LEFT COMPARISON:  None. FINDINGS: There is an acute minimally displaced and impacted subcapital fracture of the left femoral neck. Left femoral head remains normally position within the acetabulum. Femoral head height maintained. Bony pelvis intact. Limited views of the right hip demonstrate no acute finding. No acute soft tissue abnormality. Prominent atherosclerosis noted within the proximal thighs. Vascular clips overlie the proximal right thigh. IMPRESSION: Acute mildly displaced subcapital fracture of the left femoral neck. Electronically Signed   By: Jeannine Boga M.D.   On: 11/17/2018 00:45  Scheduled Meds:  docusate sodium  100 mg Oral BID   furosemide  40 mg Intravenous Q12H   polyethylene glycol  17 g Oral Daily   Continuous Infusions:  methocarbamol (ROBAXIN) IV       LOS: 1 day    Time spent:35 minutes     Elmarie Shiley, MD [facility] Pager [phone]  If 7PM-7AM, please contact night-coverage [email or web address] Password [email or web address] 11/18/2018, 12:09 PM

## 2018-11-18 NOTE — Progress Notes (Signed)
Orthopedic Tech Progress Note Patient Details:  Timothy Suarez 12/30/1924 [id number] Went to apply over head frame with trapeze for patient but he has the old frame on the back of bed still. I let the RN know what was the situation and she said ok.. she didn't believe the patient would have used it anyways Patient ID: Timothy Suarez, male   DOB: August 06, 1925, 85 y.o.   MRN: [id number]   Janit Pagan 11/18/2018, 12:08 PM

## 2018-11-18 NOTE — Anesthesia Postprocedure Evaluation (Signed)
Anesthesia Post Note  Patient: Timothy Suarez  Procedure(s) Performed: PATIAL HIP ARTHROPLASTY ANTERIOR APPROACH (Left )     Patient location during evaluation: PACU Anesthesia Type: General Level of consciousness: awake and alert, patient cooperative and oriented Pain management: pain level controlled Vital Signs Assessment: post-procedure vital signs reviewed and stable Respiratory status: spontaneous breathing, nonlabored ventilation, respiratory function stable and patient connected to tracheostomy mask oxygen Cardiovascular status: blood pressure returned to baseline and stable Postop Assessment: no apparent nausea or vomiting Anesthetic complications: no    Last Vitals:  Vitals:   11/18/18 1310 11/18/18 1408  BP: 106/62 (!) 110/58  Pulse: 69 (!) 108  Resp: 16   Temp: (!) 36.4 C   SpO2:      Last Pain:  Vitals:   11/18/18 1310  TempSrc: Axillary  PainSc:                  Sherrick Araki,E. Kendria Halberg

## 2018-11-18 NOTE — Plan of Care (Signed)
  Problem: Pain Managment: Goal: General experience of comfort will improve Outcome: Progressing   Problem: Skin Integrity: Goal: Risk for impaired skin integrity will decrease Outcome: Progressing   

## 2018-11-18 NOTE — Op Note (Signed)
PATIAL HIP ARTHROPLASTY ANTERIOR APPROACH  Procedure Note Timothy Suarez   [id number]  Pre-op Diagnosis: LEFT HIP FRACTURE     Post-op Diagnosis: same   Operative Procedures  1. Prosthetic replacement for femoral neck fracture. CPT [phone]  Personnel  Surgeon(s): Leandrew Koyanagi, MD  ASSIST: Madalyn Rob, PA-C; necessary for the timely completion of procedure and due to complexity of procedure.   Anesthesia: general  Prosthesis: Depuy Femur: Corail KA 13 Head: 55 mm size: +1.5 Bearing Type: bipolar  Hip Hemiarthroplasty (Anterior Approach) Op Note:  After informed consent was obtained and the operative extremity marked in the holding area, the patient was brought back to the operating room and placed supine on the HANA table. Next, the operative extremity was prepped and draped in normal sterile fashion. Surgical timeout occurred verifying patient identification, surgical site, surgical procedure and administration of antibiotics.  A modified anterior Smith-Peterson approach to the hip was performed, using the interval between tensor fascia lata and sartorius.  Dissection was carried bluntly down onto the anterior hip capsule. The lateral femoral circumflex vessels were identified and coagulated. A capsulotomy was performed and the capsular flaps tagged for later repair.  Fluoroscopy was utilized to prepare for the femoral neck cut. The neck osteotomy was performed. The femoral head was removed and found a 55 mm head was the appropriate fit.    We then turned our attention to the femur.  After placing the femoral hook, the leg was taken to externally rotated, extended and adducted position taking care to perform soft tissue releases to allow for adequate mobilization of the femur. Soft tissue was cleared from the shoulder of the greater trochanter and the hook elevator used to improve exposure of the proximal femur. Sequential broaching performed up to a size 13. Trial neck and  head were placed. The leg was brought back up to neutral and the construct reduced. The position and sizing of components, offset and leg lengths were checked using fluoroscopy. Stability of the construct was checked in extension and external rotation without any subluxation or impingement of prosthesis. We dislocated the prosthesis, dropped the leg back into position, removed trial components, and irrigated copiously. The final stem and head was then placed, the leg brought back up, the system reduced and fluoroscopy used to verify positioning.  We irrigated, obtained hemostasis and closed the capsule using #2 ethibond suture.  The fascia was closed with #1 vicryl plus, the deep fat layer was closed with 0 vicryl, the subcutaneous layers closed with 2.0 Vicryl Plus and the skin closed with staples. A sterile dressing was applied. The patient was awakened in the operating room and taken to recovery in stable condition. All sponge, needle, and instrument counts were correct at the end of the case.   Position: supine  Complications: none.  Time Out: performed   Drains/Packing: none  Estimated blood loss: see anesthesia record  Returned to Recovery Room: in good condition.   Antibiotics: yes   Mechanical VTE (DVT) Prophylaxis: sequential compression devices, TED thigh-high  Chemical VTE (DVT) Prophylaxis: lovenox  Fluid Replacement: Crystalloid: see anesthesia record  Specimens Removed: 1 to pathology   Sponge and Instrument Count Correct? yes   PACU: portable radiograph - low AP   Admission: inpatient status, start PT & OT POD#1  Plan/RTC: Return in 2 weeks for staple removal. Return in 6 weeks to see MD.  Weight Bearing/Load Lower Extremity: full  Hip precautions: none  N. Eduard Roux, MD [facility]  [phone] 9:17 AM    Implant Name Type Inv. Item Serial No. Manufacturer Lot No. LRB No. Used  BIPOLAR PROS AML 55MM - [id number] Hips BIPOLAR PROS AML 55MM  DEPUY  SYNTHES [id number] Left 1  HIP BALL ARTICU DEPUY - [id number] Hips HIP BALL ARTICU DEPUY  DEPUY SYNTHES [id number] Left 1  STEM CORAIL KA13 - [id number] Stem STEM CORAIL KA13  DEPUY SYNTHES [id number] Left 1

## 2018-11-18 NOTE — Progress Notes (Signed)
DAILY PROGRESS NOTE   Patient Name: Timothy Suarez Date of Encounter: 11/18/2018 Cardiologist: Fransico Him, MD  Chief Complaint   Somnolent  Patient Profile   Timothy Suarez is a 85 y.o. male w/ h/o laryngeal cancer s/p tracheostomy and uses a passy muir speaking valveto communicate, as well as h/o colon cancer, MVP w/ severe MR s/p minimally invasive mitral valve repair, nonobstructive CAD by cath at time of valve repair (50-70% LAD lesion), chronic systolic HF/DCM w/ EF of 17-40%, PAF s/p Maze procedure, chronic anticoagulation w/ Eliquis, thoracic aortic aneurysm, second-degree AV block type I and II, RBBB, HTN, HLD, h/o CVA and mild cognitive impairment, admitted after a unwitnessed, presumed, mechanical fall resulting in left femoral neck fracture. He is being seen today for preoperative evaluation/cardiac risk assessment for hip arthoplasty, at the request of Dr. Tyrell Antonio, Internal Medicine and Dr. Erlinda Hong, Orthopedics.  Subjective   OR this morning with successful surgery. Appears to have diuresed 1.5L negative overnight. Weight 78 kg today. Creatinine improved from 1.58 to 1.34. He was somnolent when I examined him - did arouse to stimulation but did not answer questions.  Objective   Vitals:   11/17/18 1500 11/17/18 2318 11/18/18 0458 11/18/18 0500  BP: 119/73 134/85 114/75   Pulse: 75 78  95  Resp: 16 20 18    Temp: (!) 97.4 F (36.3 C) (!) 97.5 F (36.4 C) (!) 97.4 F (36.3 C)   TempSrc: Oral Axillary Oral   SpO2: 94% 93% 95% 95%  Weight:    77.9 kg    Intake/Output Summary (Last 24 hours) at 11/18/2018 8144 Last data filed at 11/18/2018 8185 Gross per 24 hour  Intake 536 ml  Output 2490 ml  Net -1954 ml   Filed Weights   11/18/18 0500  Weight: 77.9 kg    Physical Exam   General appearance: somnolent, apperas pale Neck: no carotid bruit, no JVD, thyroid not enlarged, symmetric, no tenderness/mass/nodules and s/p layngectomy with stoma Lungs:  diminished breath sounds bibasilar Heart: regular rate and rhythm Abdomen: soft, non-tender; bowel sounds normal; no masses,  no organomegaly Extremities: edema 1+ edema Pulses: 2+ and symmetric Skin: pale, dry, wam Neurologic: Mental status: somnolent, minimally arousable Psych: Cannot assess  Inpatient Medications    Scheduled Meds: . [MAR Hold] docusate sodium  100 mg Oral BID  . [MAR Hold] furosemide  40 mg Intravenous Q12H  . [MAR Hold] polyethylene glycol  17 g Oral Daily  . povidone-iodine  2 application Topical Once  . povidone-iodine  2 application Topical Once    Continuous Infusions:   PRN Meds: 0.9 % irrigation (POUR BTL), [MAR Hold] HYDROcodone-acetaminophen, [MAR Hold] LORazepam, [MAR Hold]  morphine injection, [MAR Hold] senna-docusate, sodium chloride irrigation, vancomycin   Labs   Results for orders placed or performed during the hospital encounter of 11/16/18 (from the past 48 hour(s))  Basic metabolic panel     Status: Abnormal   Collection Time: 11/17/18  1:18 AM  Result Value Ref Range   Sodium 141 135 - 145 mmol/L   Potassium 3.4 (L) 3.5 - 5.1 mmol/L   Chloride 107 98 - 111 mmol/L   CO2 23 22 - 32 mmol/L   Glucose, Bld 111 (H) 70 - 99 mg/dL   BUN 27 (H) 8 - 23 mg/dL   Creatinine, Ser 1.50 (H) 0.61 - 1.24 mg/dL   Calcium 8.8 (L) 8.9 - 10.3 mg/dL   GFR calc non Af Amer 40 (L) >60 mL/min   GFR  calc Af Amer 47 (L) >60 mL/min   Anion gap 11 5 - 15    Comment: Performed at [facility] [address], [place], [place] [address]  CBC with Differential     Status: Abnormal   Collection Time: 11/17/18  1:18 AM  Result Value Ref Range   WBC 7.4 4.0 - 10.5 K/uL   RBC 4.85 4.22 - 5.81 MIL/uL   Hemoglobin 15.4 13.0 - 17.0 g/dL   HCT 49.0 39.0 - 52.0 %   MCV 101.0 (H) 80.0 - 100.0 fL   MCH 31.8 26.0 - 34.0 pg   MCHC 31.4 30.0 - 36.0 g/dL   RDW 14.0 11.5 - 15.5 %   Platelets 150 150 - 400 K/uL   nRBC 0.0 0.0 - 0.2 %   Neutrophils Relative % 75 %    Neutro Abs 5.6 1.7 - 7.7 K/uL   Lymphocytes Relative 15 %   Lymphs Abs 1.1 0.7 - 4.0 K/uL   Monocytes Relative 7 %   Monocytes Absolute 0.5 0.1 - 1.0 K/uL   Eosinophils Relative 2 %   Eosinophils Absolute 0.1 0.0 - 0.5 K/uL   Basophils Relative 1 %   Basophils Absolute 0.0 0.0 - 0.1 K/uL   Immature Granulocytes 0 %   Abs Immature Granulocytes 0.01 0.00 - 0.07 K/uL    Comment: Performed at [facility] [address], [place], [place] [address]  Protime-INR     Status: Abnormal   Collection Time: 11/17/18  1:18 AM  Result Value Ref Range   Prothrombin Time 16.6 (H) 11.4 - 15.2 seconds   INR 1.4 (H) 0.8 - 1.2    Comment: (NOTE) INR goal varies based on device and disease states. Performed at [facility], [place] [address] [place], [place] [address]   APTT     Status: Abnormal   Collection Time: 11/17/18  1:18 AM  Result Value Ref Range   aPTT 39 (H) 24 - 36 seconds    Comment:        IF BASELINE aPTT IS ELEVATED, SUGGEST PATIENT RISK ASSESSMENT BE USED TO DETERMINE APPROPRIATE ANTICOAGULANT THERAPY. Performed at [facility], [place] [address], [place], [place] [address]   Brain natriuretic peptide     Status: Abnormal   Collection Time: 11/17/18  1:18 AM  Result Value Ref Range   B Natriuretic Peptide 3,122.2 (H) 0.0 - 100.0 pg/mL    Comment: Performed at [facility] [address] [place], [place] [address]  Surgical pcr screen     Status: None   Collection Time: 11/17/18  3:09 AM  Result Value Ref Range   MRSA, PCR NEGATIVE NEGATIVE   Staphylococcus aureus NEGATIVE NEGATIVE    Comment: (NOTE) The Xpert SA Assay (FDA approved for NASAL specimens in patients 84 years of age and older), is one component of a comprehensive surveillance program. It is not intended to diagnose infection nor to guide or monitor treatment. Performed at [facility], [place] [address] [place], [place] [address]   Troponin I - ONCE - STAT     Status: Abnormal    Collection Time: 11/17/18  3:29 AM  Result Value Ref Range   Troponin I 0.03 (HH) <0.03 ng/mL    Comment: CRITICAL RESULT CALLED TO, READ BACK BY AND VERIFIED WITH: Jenetta Loges D,RN 11/17/18 0439 WAYK Performed at [facility], [place] [address], [place], [place] [address]   Basic metabolic panel  Status: Abnormal   Collection Time: 11/17/18  3:29 AM  Result Value Ref Range   Sodium 142 135 - 145 mmol/L   Potassium 3.3 (L) 3.5 - 5.1 mmol/L   Chloride 108 98 - 111 mmol/L   CO2 19 (L) 22 - 32 mmol/L   Glucose, Bld 115 (H) 70 - 99 mg/dL   BUN 29 (H) 8 - 23 mg/dL   Creatinine, Ser 1.58 (H) 0.61 - 1.24 mg/dL   Calcium 8.7 (L) 8.9 - 10.3 mg/dL   GFR calc non Af Amer 38 (L) >60 mL/min   GFR calc Af Amer 44 (L) >60 mL/min   Anion gap 15 5 - 15    Comment: Performed at [facility] [address], [place], [place] [address]  Magnesium     Status: None   Collection Time: 11/17/18  3:29 AM  Result Value Ref Range   Magnesium 2.2 1.7 - 2.4 mg/dL    Comment: Performed at [facility] [address], [place], [place] [address]  CBC     Status: Abnormal   Collection Time: 11/18/18  3:43 AM  Result Value Ref Range   WBC 8.5 4.0 - 10.5 K/uL   RBC 4.54 4.22 - 5.81 MIL/uL   Hemoglobin 14.4 13.0 - 17.0 g/dL   HCT 44.4 39.0 - 52.0 %   MCV 97.8 80.0 - 100.0 fL   MCH 31.7 26.0 - 34.0 pg   MCHC 32.4 30.0 - 36.0 g/dL   RDW 14.0 11.5 - 15.5 %   Platelets 126 (L) 150 - 400 K/uL   nRBC 0.0 0.0 - 0.2 %    Comment: Performed at [facility], [place] [address] [place], [place] [address]  Basic metabolic panel     Status: Abnormal   Collection Time: 11/18/18  3:43 AM  Result Value Ref Range   Sodium 143 135 - 145 mmol/L   Potassium 3.4 (L) 3.5 - 5.1 mmol/L   Chloride 111 98 - 111 mmol/L   CO2 22 22 - 32 mmol/L   Glucose, Bld 171 (H) 70 - 99 mg/dL   BUN 21 8 - 23 mg/dL   Creatinine, Ser 1.34 (H) 0.61 - 1.24 mg/dL   Calcium 8.6 (L) 8.9 - 10.3 mg/dL   GFR calc non Af Amer 46  (L) >60 mL/min   GFR calc Af Amer 53 (L) >60 mL/min   Anion gap 10 5 - 15    Comment: Performed at [facility] [address], [place], [place] [address]    ECG   N/A  Telemetry   Afib with CVR, PVC's - Personally Reviewed  Radiology    Dg Chest 1 View  Result Date: 11/17/2018 CLINICAL DATA:  Initial evaluation for acute hip fracture, preoperative evaluation. EXAM: CHEST  1 VIEW COMPARISON:  Prior radiograph from 08/10/2017. FINDINGS: Advanced cardiomegaly, stable. Mediastinal silhouette within normal limits. Aortic atherosclerosis. Lungs mildly hypoinflated. Mild diffuse pulmonary interstitial congestion without frank alveolar edema. Suspected small bilateral pleural effusions. No consolidative opacity. No pneumothorax. No acute osseous finding. IMPRESSION: 1. Cardiomegaly with mild diffuse pulmonary interstitial congestion and small bilateral pleural effusions. 2. Aortic atherosclerosis. Electronically Signed   By: Jeannine Boga M.D.   On: 11/17/2018 01:20   Dg Elbow Complete Left  Result Date: 11/17/2018 CLINICAL DATA:  Initial evaluation for acute trauma, fall. EXAM: LEFT ELBOW - COMPLETE 3+ VIEW COMPARISON:  None. FINDINGS: No acute fracture dislocation. No joint effusion. Radial head intact. Mild  degenerative spurring at the medial and lateral epicondyles noted. No acute soft tissue abnormality. IMPRESSION: No acute osseous abnormality about the left elbow. Electronically Signed   By: Jeannine Boga M.D.   On: 11/17/2018 00:48   Ct Head Wo Contrast  Result Date: 11/17/2018 CLINICAL DATA:  Initial evaluation for acute trauma, fall. EXAM: CT HEAD WITHOUT CONTRAST TECHNIQUE: Contiguous axial images were obtained from the base of the skull through the vertex without intravenous contrast. COMPARISON:  Prior MRI from 08/12/2017. FINDINGS: Brain: Moderately advanced age-related cerebral atrophy with chronic small vessel ischemic disease. Superimposed small remote left  cerebellar infarct. No acute intracranial hemorrhage. No acute large vessel territory infarct. No mass lesion, midline shift or mass effect. No hydrocephalus. No extra-axial fluid collection. Vascular: No hyperdense vessel. Calcified atherosclerosis present at the skull base. Skull: Scalp soft tissues within normal limits.  Calvarium intact. Sinuses/Orbits: Globes and orbital soft tissues demonstrate no acute finding. Mild mucosal thickening within the ethmoidal air cells and maxillary sinuses. Paranasal sinuses are otherwise clear. Trace bilateral mastoid effusions, of doubtful significance. Other: None. IMPRESSION: 1. No acute intracranial abnormality. 2. Moderately advanced age-related cerebral atrophy with chronic microvascular ischemic disease. Electronically Signed   By: Jeannine Boga M.D.   On: 11/17/2018 01:17   Dg Knee Complete 4 Views Left  Result Date: 11/17/2018 CLINICAL DATA:  Initial evaluation for acute trauma, fall. EXAM: LEFT KNEE - COMPLETE 4+ VIEW COMPARISON:  None. FINDINGS: No acute fracture dislocation. No joint effusion. Advanced osteoarthritic changes about the knee. No acute soft tissue abnormality. Prominent atherosclerotic change noted. IMPRESSION: 1. No acute osseous abnormality about the left knee. 2. Advanced degenerative osteoarthrosis. Electronically Signed   By: Jeannine Boga M.D.   On: 11/17/2018 00:46   Dg Hip Unilat W Or Wo Pelvis 2-3 Views Left  Result Date: 11/17/2018 CLINICAL DATA:  Initial evaluation for acute trauma, fall. EXAM: DG HIP (WITH OR WITHOUT PELVIS) 2-3V LEFT COMPARISON:  None. FINDINGS: There is an acute minimally displaced and impacted subcapital fracture of the left femoral neck. Left femoral head remains normally position within the acetabulum. Femoral head height maintained. Bony pelvis intact. Limited views of the right hip demonstrate no acute finding. No acute soft tissue abnormality. Prominent atherosclerosis noted within the proximal  thighs. Vascular clips overlie the proximal right thigh. IMPRESSION: Acute mildly displaced subcapital fracture of the left femoral neck. Electronically Signed   By: Jeannine Boga M.D.   On: 11/17/2018 00:45    Cardiac Studies   N/A  Assessment   1. Principal Problem: 2.   Closed displaced fracture of left femoral neck ([place]) 3. Active Problems: 4.   Paroxysmal atrial fibrillation (HCC) 5.   Chronic systolic congestive heart failure ([place]) 6.   Fall 7.   AKI (acute kidney injury) ([place]) 8.   Plan   1. Good response to diuretics - continue IV diuresis today. Resume Eliquis when ok with Ortho. Creatinine improving.  Time Spent Directly with Patient:  I have spent a total of 25 minutes with the patient reviewing hospital notes, telemetry, EKGs, labs and examining the patient as well as establishing an assessment and plan that was discussed personally with the patient.  > 50% of time was spent in direct patient care.  Length of Stay:  LOS: 1 day   Pixie Casino, MD, [facility], [place] Director of the Advanced Lipid Disorders &  Cardiovascular Risk Reduction Clinic Diplomate of the American Board of Clinical  Lipidology Attending Cardiologist  Direct Dial: [phone]  Fax: [phone]  Website:  [email or web address] 11/18/2018, 9:39 AM

## 2018-11-18 NOTE — TOC Initial Note (Signed)
Transition of Care [facility]) - Initial/Assessment Note    Patient Details  Name: Timothy Suarez MRN: [id number] Date of Birth: [birth year]  Transition of Care [facility]) CM/SW Contact:    Alexander Mt, [place] Phone Number: 11/18/2018, 12:42 PM  Clinical Narrative:                 CSW spoke with pt daughter due to pt difficulty speaking, reached her via telephone. Introduced self, role, reason for call. Pt from [facility]. He normally moves around well, but pt daughter does expect that he will need SNF at discharge. Pt has previously been to [facility] and pt daughter prefers [facility]. Explained to pt daughter that many SNFs are requiring coronavirus test prior to admission and at this time pt did not have test ordered. Pt daughter states understanding, okay with referral being sent out further.   Will follow for PT/OT evaluations and send referrals.   Expected Discharge Plan: Skilled Nursing Facility Barriers to Discharge: Continued Medical Work up, Ship broker, Environmental education officer)   Patient Goals and CMS Choice Patient states their goals for this hospitalization and ongoing recovery are:: "for him to go to rehab and eventually transfer from [place] to ALF at [facility]."- pt daughter CMS Medicare.gov Compare Post Acute Care list provided to:: Patient Represenative (must comment) Choice offered to / list presented to : Adult Children  Expected Discharge Plan and Services Expected Discharge Plan: [place] In-house Referral: NA Discharge Planning Services: NA Post Acute Care Choice: [place] Living arrangements for the past 2 months: [place]                          Prior Living Arrangements/Services Living arrangements for the past 2 months: [place] Lives with:: Self, Facility Resident Patient language and need for interpreter reviewed:: No Do you feel safe going  back to the place where you live?: Yes(post rehab)      Need for Family Participation in Patient Care: Yes (Comment)(decision making; physically supporting as able) Care giver support system in place?: Yes (comment)(adult daughters) Current home services: DME Criminal Activity/Legal Involvement Pertinent to Current Situation/Hospitalization: No - Comment as needed  Activities of Daily Living   ADL Screening (condition at time of admission) Is the patient deaf or have difficulty hearing?: Yes  Permission Sought/Granted Permission sought to share information with : Family Supports Permission granted to share information with : No(pt is unable to provide thorough assessment due to stoma/lack of working [employer])  Share Information with NAME: [name]  Permission granted to share info w AGENCY: SNFs- Preference for [employer] granted to share info w Relationship: daughter  Permission granted to share info w Contact Information: [phone]  Emotional Assessment Appearance:: Other (Comment Required(completed eval with pt daughter) Attitude/Demeanor/Rapport: Unable to Assess Affect (typically observed): Unable to Assess Orientation: : Oriented to Self, Fluctuating Orientation (Suspected and/or reported [place]) Alcohol / Substance Use: Not Applicable Psych Involvement: No (comment)  Admission diagnosis:  Pre-op chest exam [L24.401] Fall, initial encounter [W19.XXXA] Closed fracture of neck of left femur, initial encounter ([place]) [S72.002A] Hip fracture ([place]) [S72.009A] Patient Active Problem List   Diagnosis Date Noted  . Closed displaced fracture of left femoral neck ([place]) 11/17/2018  . Fall 11/17/2018  . AKI (acute kidney injury) ([place]) 11/17/2018  . Oropharyngeal dysphagia 11/15/2017  . Alaryngeal voice 11/15/2017  . Cognitive deficits 09/06/2017  . Abnormal  TSH 08/26/2017  . Thrombocytopenia ([place]) 08/26/2017  . Peripheral vascular disease ([place]) 08/26/2017  .  Hallucination 08/10/2017  . Balance problems 07/08/2017  . Baker cyst, left 07/08/2017  . Dilated cardiomyopathy ([place]) 04/19/2017  . Chronic systolic congestive heart failure ([place]) 03/30/2017  . Left ventricular ejection fraction of 21% to 30% 12/29/2016  . Pulmonary edema 12/06/2016  . Presbycusis of both ears 05/25/2016  . Protein-calorie malnutrition, severe 02/26/2016  . Pharyngocutaneous fistula 02/21/2016  . S/P laryngectomy 01/15/2016  . History of laryngeal cancer 12/31/2015  . Other fatigue 12/09/2015  . 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 Prostate cancer ([place]) 01/10/2015  . S/P MVR (mitral valve repair) 12/20/2014  . S/P Maze operation for atrial fibrillation 12/20/2014  . Obesity (BMI 30-39.9) 04/02/2014  . Prolonged Q-T interval on ECG 04/02/2014  . Mild cognitive impairment 04/02/2014  . Major depressive disorder, recurrent episode, mild ([place]) 02/08/2014  . Insomnia 02/08/2014  . Dizziness and giddiness 02/08/2014  . Generalized muscle weakness 02/08/2014  . Glaucoma 02/08/2014  . Colon cancer ([place])   . Coronary artery disease   . Moderate aortic insufficiency 01/03/2013  . Mild mitral regurgitation 01/03/2013  . Primary open angle glaucoma 12/03/2011  . MVP (mitral valve prolapse)   . Paroxysmal atrial fibrillation (HCC)   . HTN (hypertension)   . Depression   . chest wall hernia (lung hernia)    PCP:  Gayland Curry, DO Pharmacy:   [facility] [address] [place] [address] [place] [address] Phone: [phone] Fax: [phone]     Social Determinants of Health (SDOH) Interventions    Readmission Risk Interventions No flowsheet data found.

## 2018-11-18 NOTE — Anesthesia Procedure Notes (Signed)
Procedure Name: Intubation Date/Time: 11/18/2018 7:55 AM Performed by: Jearld Pies, CRNA Pre-anesthesia Checklist: Patient identified, Emergency Drugs available, Suction available and Patient being monitored Patient Re-evaluated:Patient Re-evaluated prior to induction Oxygen Delivery Method: Circle System Utilized Preoxygenation: Pre-oxygenation with 100% oxygen Induction Type: IV induction Ventilation: Unable to mask ventilate Tube type: Reinforced Tube size: 7.0 mm Number of attempts: 1 Placement Confirmation: positive ETCO2 and breath sounds checked- equal and bilateral Tube secured with: Tape Dental Injury: Teeth and Oropharynx as per pre-operative assessment  Comments: Laryngectomy - 7.0 reinforced ETT passed through stoma via Glennon Mac MD.

## 2018-11-18 NOTE — [place] FL2 (Signed)
[place] MEDICAID FL2 LEVEL OF CARE SCREENING TOOL     IDENTIFICATION  Patient Name: Timothy Suarez Birthdate: May 14, 1925 Sex: male Admission Date (Current Location): 11/16/2018  [facility] and [place] Number:  Herbalist and Address:  [facility] [place]. [facility], [place] [address], [place], [place] [address]      Provider Number: [id number]  Attending Physician Name and Address:  Elmarie Shiley, MD  Relative Name and Phone Number:  [name]; daughter; [phone]    Current Level of Care: Hospital Recommended Level of Care: [place] Prior Approval Number:    Date Approved/Denied:   PASRR Number: [id number]  Discharge Plan:      Current Diagnoses: Patient Active Problem List   Diagnosis Date Noted  . Closed displaced fracture of left femoral neck ([place]) 11/17/2018  . Fall 11/17/2018  . AKI (acute kidney injury) ([place]) 11/17/2018  . Oropharyngeal dysphagia 11/15/2017  . Alaryngeal voice 11/15/2017  . Cognitive deficits 09/06/2017  . Abnormal TSH 08/26/2017  . Thrombocytopenia ([place]) 08/26/2017  . Peripheral vascular disease ([place]) 08/26/2017  . Hallucination 08/10/2017  . Balance problems 07/08/2017  . Baker cyst, left 07/08/2017  . Dilated cardiomyopathy ([place]) 04/19/2017  . Chronic systolic congestive heart failure ([place]) 03/30/2017  . Left ventricular ejection fraction of 21% to 30% 12/29/2016  . Pulmonary edema 12/06/2016  . Presbycusis of both ears 05/25/2016  . Protein-calorie malnutrition, severe 02/26/2016  . Pharyngocutaneous fistula 02/21/2016  . S/P laryngectomy 01/15/2016  . History of laryngeal cancer 12/31/2015  . Other fatigue 12/09/2015  . 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 Prostate cancer ([place]) 01/10/2015  . S/P MVR (mitral valve repair) 12/20/2014  . S/P Maze operation for atrial fibrillation 12/20/2014  . Obesity (BMI 30-39.9) 04/02/2014  . Prolonged Q-T interval  on ECG 04/02/2014  . Mild cognitive impairment 04/02/2014  . Major depressive disorder, recurrent episode, mild ([place]) 02/08/2014  . Insomnia 02/08/2014  . Dizziness and giddiness 02/08/2014  . Generalized muscle weakness 02/08/2014  . Glaucoma 02/08/2014  . Colon cancer ([place])   . Coronary artery disease   . Moderate aortic insufficiency 01/03/2013  . Mild mitral regurgitation 01/03/2013  . Primary open angle glaucoma 12/03/2011  . MVP (mitral valve prolapse)   . Paroxysmal atrial fibrillation (HCC)   . HTN (hypertension)   . Depression   . chest wall hernia (lung hernia)     Orientation RESPIRATION BLADDER Height & Weight     Self, Place  Normal(Has a stoma. Suctioned BID or at least once per day (not new on this admission)) Continent, External catheter Weight: 171 lb 11.8 oz (77.9 kg) Height:     BEHAVIORAL SYMPTOMS/MOOD NEUROLOGICAL BOWEL NUTRITION STATUS      Continent Diet(heart healthy diet; thin liquids)  AMBULATORY STATUS COMMUNICATION OF NEEDS Skin   Extensive Assist Verbally- utilizes passy muir valve; also able to write answers Surgical wounds, Other (Comment)(incision on hip with hydrocolloid dressing; )                       Personal Care Assistance Level of Assistance  Bathing, Feeding, Dressing Bathing Assistance: Maximum assistance Feeding assistance: Independent Dressing Assistance: Maximum assistance     Functional Limitations Info  Sight, Hearing, Speech Sight Info: Adequate Hearing Info: Adequate Speech Info: Impaired    SPECIAL CARE FACTORS FREQUENCY  PT (By licensed PT), OT (By licensed OT)     PT Frequency: 5x week  OT Frequency: 5x week            Contractures Contractures Info: Not present    Additional Factors Info  Code Status, Allergies Code Status Info: Full Code Allergies Info: METOPROLOL, CITALOPRAM, TRAZODONE AND NEFAZODONE, ZOLOFT SERTRALINE HCL            Current Medications (11/18/2018):  This is the current  hospital active medication list Current Facility-Administered Medications  Medication Dose Route Frequency Provider Last Rate Last Dose  . 0.9 %  sodium chloride infusion   Intravenous Continuous Leandrew Koyanagi, MD      . Derrill Memo ON 11/19/2018] acetaminophen (TYLENOL) tablet 325-650 mg  325-650 mg Oral Q6H PRN Leandrew Koyanagi, MD      . acetaminophen (TYLENOL) tablet 500 mg  500 mg Oral Q6H Leandrew Koyanagi, MD      . alum & mag hydroxide-simeth (MAALOX/MYLANTA) 200-200-20 MG/5ML suspension 30 mL  30 mL Oral Q4H PRN Leandrew Koyanagi, MD      . Derrill Memo ON 11/19/2018] apixaban (ELIQUIS) tablet 5 mg  5 mg Oral BID Leandrew Koyanagi, MD      . ceFAZolin (ANCEF) IVPB 2g/100 mL premix  2 g Intravenous Q6H Leandrew Koyanagi, MD      . docusate sodium (COLACE) capsule 100 mg  100 mg Oral BID Leandrew Koyanagi, MD      . Derrill Memo ON 11/19/2018] furosemide (LASIX) injection 40 mg  40 mg Intravenous Q12H Regalado, Belkys A, MD      . HYDROcodone-acetaminophen (NORCO) 7.5-325 MG per tablet 1-2 tablet  1-2 tablet Oral Q4H PRN Leandrew Koyanagi, MD      . HYDROcodone-acetaminophen (NORCO/VICODIN) 5-325 MG per tablet 1-2 tablet  1-2 tablet Oral Q4H PRN Leandrew Koyanagi, MD      . LORazepam (ATIVAN) injection 0.5 mg  0.5 mg Intravenous BID PRN Leandrew Koyanagi, MD   0.5 mg at 11/17/18 1656  . magnesium citrate solution 1 Bottle  1 Bottle Oral Once PRN Leandrew Koyanagi, MD      . menthol-cetylpyridinium (CEPACOL) lozenge 3 mg  1 lozenge Oral PRN Leandrew Koyanagi, MD       Or  . phenol (CHLORASEPTIC) mouth spray 1 spray  1 spray Mouth/Throat PRN Leandrew Koyanagi, MD      . methocarbamol (ROBAXIN) tablet 500 mg  500 mg Oral Q6H PRN Leandrew Koyanagi, MD       Or  . methocarbamol (ROBAXIN) 500 mg in dextrose 5 % 50 mL IVPB  500 mg Intravenous Q6H PRN Leandrew Koyanagi, MD      . morphine 2 MG/ML injection 1 mg  1 mg Intravenous Q2H PRN Leandrew Koyanagi, MD      . polyethylene glycol (MIRALAX / GLYCOLAX) packet 17 g  17 g Oral Daily Leandrew Koyanagi, MD   17 g at 11/17/18  1503  . polyethylene glycol (MIRALAX / GLYCOLAX) packet 17 g  17 g Oral Daily PRN Leandrew Koyanagi, MD      . potassium chloride SA (K-DUR,KLOR-CON) CR tablet 40 mEq  40 mEq Oral Once Regalado, Belkys A, MD      . senna-docusate (Senokot-S) tablet 1 tablet  1 tablet Oral QHS PRN Leandrew Koyanagi, MD      . sorbitol 70 % solution 30 mL  30 mL Oral Daily PRN Leandrew Koyanagi, MD         Discharge Medications: Please see discharge summary for a list  of discharge medications.  Relevant Imaging Results:  Relevant Lab Results:   Additional Information SS#: [id number]. From [place], plans to return post rehab.  Alexander Mt, LCSWA

## 2018-11-18 NOTE — Transfer of Care (Signed)
Immediate Anesthesia Transfer of Care Note  Patient: Timothy Suarez  Procedure(s) Performed: PATIAL HIP ARTHROPLASTY ANTERIOR APPROACH (Left )  Patient Location: PACU  Anesthesia Type:General  Level of Consciousness: drowsy and responds to stimulation  Airway & Oxygen Therapy: Patient Spontanous Breathing and Patient connected to T-piece oxygen  Post-op Assessment: Report given to RN and Post -op Vital signs reviewed and stable  Post vital signs: Reviewed and stable  Last Vitals:  Vitals Value Taken Time  BP 113/45 11/18/2018  9:54 AM  Temp    Pulse 68 11/18/2018  9:58 AM  Resp 20 11/18/2018  9:58 AM  SpO2 98 % 11/18/2018  9:58 AM  Vitals shown include unvalidated device data.  Last Pain:  Vitals:   11/18/18 0458  TempSrc: Oral  PainSc:      Report to Lovena Le RN in PACU. Patient transported on trach collar oxygen system and maintained in PACU. VSS. Laryngectomy stoma site WNL, full report given.    Complications: No apparent anesthesia complications

## 2018-11-18 NOTE — Progress Notes (Signed)
Pt transfer to OR for surgery. Preop checklist done. Electronic voice box sent with pt.

## 2018-11-19 LAB — BASIC METABOLIC PANEL
Anion gap: 15 (ref 5–15)
BUN: 23 mg/dL (ref 8–23)
CO2: 23 mmol/L (ref 22–32)
Calcium: 8.5 mg/dL — ABNORMAL LOW (ref 8.9–10.3)
Chloride: 103 mmol/L (ref 98–111)
Creatinine, Ser: 1.51 mg/dL — ABNORMAL HIGH (ref 0.61–1.24)
GFR calc Af Amer: 46 mL/min — ABNORMAL LOW (ref >60)
GFR calc non Af Amer: 40 mL/min — ABNORMAL LOW (ref >60)
Glucose, Bld: 144 mg/dL — ABNORMAL HIGH (ref 70–99)
Potassium: 3.4 mmol/L — ABNORMAL LOW (ref 3.5–5.1)
Sodium: 141 mmol/L (ref 135–145)

## 2018-11-19 LAB — CBC
HCT: 40.7 % (ref 39.0–52.0)
Hemoglobin: 13.5 g/dL (ref 13.0–17.0)
MCH: 33.3 pg (ref 26.0–34.0)
MCHC: 33.2 g/dL (ref 30.0–36.0)
MCV: 100.2 fL — ABNORMAL HIGH (ref 80.0–100.0)
Platelets: 116 10*3/uL — ABNORMAL LOW (ref 150–400)
RBC: 4.06 MIL/uL — ABNORMAL LOW (ref 4.22–5.81)
RDW: 13.9 % (ref 11.5–15.5)
WBC: 9.9 10*3/uL (ref 4.0–10.5)
nRBC: 0.2 % (ref 0.0–0.2)

## 2018-11-19 LAB — GLUCOSE, CAPILLARY
Glucose-Capillary: 100 mg/dL — ABNORMAL HIGH (ref 70–99)
Glucose-Capillary: 98 mg/dL (ref 70–99)

## 2018-11-19 MED ORDER — FUROSEMIDE 20 MG PO TABS: 20.0000 mg | ORAL_TABLET | Freq: Every evening | ORAL | Status: DC

## 2018-11-19 MED ORDER — FUROSEMIDE 40 MG PO TABS
40.0000 mg | ORAL_TABLET | Freq: Every day | ORAL | Status: DC
  Administered 2018-11-19: 40 mg via ORAL
  Filled 2018-11-19: qty 1

## 2018-11-19 NOTE — Plan of Care (Signed)
  Problem: Pain Managment: Goal: General experience of comfort will improve Outcome: Progressing   

## 2018-11-19 NOTE — Plan of Care (Signed)

## 2018-11-19 NOTE — Progress Notes (Signed)
Pharmacist Heart Failure Core Measure Documentation  Assessment: Timothy Suarez has an EF documented as 20-25% on 08/13/17 by ECHO.  Rationale: Heart failure patients with left ventricular systolic dysfunction (LVSD) and an EF < 40% should be prescribed an angiotensin converting enzyme inhibitor (ACEI) or angiotensin receptor blocker (ARB) at discharge unless a contraindication is documented in the medical record.  This patient is not currently on an ACEI or ARB for HF.  This note is being placed in the record in order to provide documentation that a contraindication to the use of these agents is present for this encounter.  ACE Inhibitor or Angiotensin Receptor Blocker is contraindicated (specify all that apply)  []   ACEI allergy AND ARB allergy []   Angioedema []   Moderate or severe aortic stenosis []   Hyperkalemia []   Hypotension []   Renal artery stenosis [x]   Worsening renal function, preexisting renal disease or dysfunction   Brain Hilts 11/19/2018 10:01 AM

## 2018-11-19 NOTE — Evaluation (Signed)
Physical Therapy Evaluation Patient Details Name: COPELAN MAULTSBY MRN: [id number] DOB: July 14, 1925 Today's Date: 11/19/2018   History of Present Illness  85 y.o. male admitted on 11/16/18 after falling at [facility] and sustaining a L femoral neck fx for which he underwent a L direct anterior THA on 11/18/18 and is WBAT post op.  Pt has significant PMH of thrombocytopenia, wandering aortic pacemaker, R vocal cord CA with trach, RBB, PVCs, PAF, MVP, HTN, glaucoma, CAD, colon CA, CKD, cerebral ischemia, R TKA, MVR, laryngectomy, and colectomy.    Clinical Impression  Pt was able to stand EOB with mod assist and RW and take a few sideways steps up the [facility].  He tolerated in bed exercises well.  He is confused and daughter reports this is normal when he is hospitalized.  Pt will need post acute rehab before returning to [place].  Family is in agreeable.   PT to follow acutely for deficits listed below.      Follow Up Recommendations SNF;Supervision/Assistance - 24 hour    Equipment Recommendations  Wheelchair (measurements PT);Wheelchair cushion (measurements PT)    Recommendations for Other Services   NA     Precautions / Restrictions Precautions Precautions: Fall      Mobility  Bed Mobility Overal bed mobility: Needs Assistance Bed Mobility: Sit to Supine;Supine to Sit     Supine to sit: Mod assist;HOB elevated Sit to supine: Mod assist;HOB elevated   General bed mobility comments: Mod assist to help progress legs over to EOB and assist with lifting up trunk to EOB.  Assist needed mostly to help lift both legs when returning back to bed.  Pt was able to help significantly when moving up towards HOB (used bil UEs and bed in trendelenberg).    Transfers Overall transfer level: Needs assistance Equipment used: Rolling walker (2 wheeled) Transfers: Sit to/from Stand Sit to Stand: Mod assist;From elevated surface         General transfer comment: Mod assist to  support trunk during transition to stand at EOB.  Pt stood twice with the support of RW and therapist.   Ambulation/Gait Ambulation/Gait assistance: Mod assist Gait Distance (Feet): 3 Feet Assistive device: Rolling walker (2 wheeled) Gait Pattern/deviations: Step-to pattern     General Gait Details: Pt was able to side step a few times towards the [facility] with mod assist at his trunk and to help steer his RW.  Verbal cues for upright posture.  Difficulty WB through his left leg due to pain.          Balance Overall balance assessment: Needs assistance Sitting-balance support: Feet supported;Bilateral upper extremity supported;No upper extremity supported Sitting balance-Leahy Scale: Good Sitting balance - Comments: supervision EOB.  We sat EOB for >30 mins as pt explained to me his theory on why he is here.     Standing balance support: Bilateral upper extremity supported Standing balance-Leahy Scale: Poor Standing balance comment: mod assist EOB.                              Pertinent Vitals/Pain Pain Assessment: Faces Faces Pain Scale: Hurts even more Pain Location: left hip Pain Descriptors / Indicators: Grimacing;Guarding Pain Intervention(s): Limited activity within patient's tolerance;Monitored during session;Repositioned    Home Living Family/patient expects to be discharged to:: Skilled nursing facility                 Additional Comments: Per daughter,  Jackelyn Poling, the family is seeking SNF for rehab.     Prior Function Level of Independence: Independent with assistive device(s);Needs assistance   Gait / Transfers Assistance Needed: per Jackelyn Poling, pt uses rollator at baseline, but is "wobbly on his feet"              Extremity/Trunk Assessment   Upper Extremity Assessment Upper Extremity Assessment: Overall WFL for tasks assessed    Lower Extremity Assessment Lower Extremity Assessment: RLE deficits/detail;LLE deficits/detail RLE Deficits /  Details: h/o R TKA LLE Deficits / Details: normal post op strength and weakness, ankle 3/5, knee 3-/5, hip 2/5.     Cervical / Trunk Assessment Cervical / Trunk Assessment: Normal  Communication   Communication: HOH(uses a electrolarynx)  Cognition Arousal/Alertness: Awake/alert Behavior During Therapy: Restless Overall Cognitive Status: Impaired/Different from baseline Area of Impairment: Orientation;Attention;Memory;Following commands;Safety/judgement;Awareness;Problem solving                 Orientation Level: Disoriented to;Time;Situation;Place Current Attention Level: Sustained Memory: Decreased short-term memory;Decreased recall of precautions Following Commands: Follows one step commands consistently Safety/Judgement: Decreased awareness of safety;Decreased awareness of deficits Awareness: Intellectual   General Comments: Pt thinks he has been kidnapped and that the surgery is a hoax.  He is paranoid and thinks he is being held against his will.  We called his daughter Jackelyn Poling and he continued to ask her over and over to come get him.  I showed Mr. Barbarann Ehlers his hip incision and he still said he didn't believe me.           Exercises Total Joint Exercises Ankle Circles/Pumps: AROM;Both;20 reps Quad Sets: AROM;Left;10 reps Short Arc Quad: AROM;Left;10 reps Heel Slides: AAROM;Left;10 reps   Assessment/Plan    PT Assessment Patient needs continued PT services  PT Problem List Decreased range of motion;Decreased strength;Decreased activity tolerance;Decreased mobility;Decreased balance;Decreased knowledge of use of DME;Decreased cognition;Decreased safety awareness;Pain       PT Treatment Interventions DME instruction;Gait training;Functional mobility training;Therapeutic activities;Therapeutic exercise;Balance training;Cognitive remediation;Patient/family education;Manual techniques;Modalities    PT Goals (Current goals can be found in the Care Plan section)  Acute Rehab  PT Goals Patient Stated Goal: Family would like him to go to SNF for rehab PT Goal Formulation: With family Time For Goal Achievement: 12/03/18 Potential to Achieve Goals: Good    Frequency Min 3X/week           AM-PAC PT "6 Clicks" Mobility  Outcome Measure Help needed turning from your back to your side while in a flat bed without using bedrails?: A Lot Help needed moving from lying on your back to sitting on the side of a flat bed without using bedrails?: A Lot Help needed moving to and from a bed to a chair (including a wheelchair)?: A Lot Help needed standing up from a chair using your arms (e.g., wheelchair or bedside chair)?: A Lot Help needed to walk in hospital room?: A Lot Help needed climbing 3-5 steps with a railing? : Total 6 Click Score: 11    End of Session   Activity Tolerance: Patient limited by pain Patient left: in bed;with call bell/phone within reach;with bed alarm set Nurse Communication: Mobility status PT Visit Diagnosis: Muscle weakness (generalized) (M62.81);Difficulty in walking, not elsewhere classified (R26.2);Pain Pain - Right/Left: Left Pain - part of body: Hip    Time: 1062-6948 PT Time Calculation (min) (ACUTE ONLY): 66 min   Charges:          Wells Guiles B. Duncan Alejandro, PT, DPT  Acute Rehabilitation #[phone] pager #[phone] office   PT Evaluation $PT Eval Moderate Complexity: 1 Mod PT Treatments $Therapeutic Exercise: 8-22 mins $Therapeutic Activity: 23-37 mins        11/19/2018, 3:56 PM

## 2018-11-19 NOTE — Progress Notes (Signed)
PROGRESS NOTE    Timothy Suarez  ZPH:[id number] DOB: 12/25/1924 DOA: 11/16/2018 PCP: Gayland Curry, DO   Brief Narrative: Timothy Suarez is a 85 y.o. male with medical history significant of CKD stage III, nonobstructive CAD, chronic systolic CHF with dilated cardiomyopathy (EF 20 to 25%), second-degree AV block type I and II, mitral valve prolapse with severe mitral regurgitation status post repair, paroxysmal atrial fibrillation, thoracic aortic aneurysm, hypertension, hyperlipidemia, mild cognitive impairment, CVA, colon cancer, vocal cord cancer status post tracheostomy presenting to the hospital via EMS for evaluation of an unwitnessed fall.   Assessment & Plan:   Principal Problem:   Closed displaced fracture of left femoral neck (HCC) Active Problems:   Paroxysmal atrial fibrillation (HCC)   Chronic systolic congestive heart failure ([place])   Fall   AKI (acute kidney injury) ([place])  Left femoral neck fracture, sub capital ;  Presume mechanical fall.  Poor historian.  Pain medication available.  Orthopedic consulted.  Cardiology consulted, pre-op evaluation and management of heart failure.  Appreciate cardio evaluation.  Patient underwent sx prosthetic replacement for femoral neck fracture.   Acute systolic Heart failure exacerbation. EF 20--25 % Chest x ray with pulmonary edema, BNP elevated.  Cr up, cardiology recommending holding lasix. Patient received oral dose lasix today.  Cardiology consulted.  Negative 1.2 L.   AKI on CKD stage III Prior cr 1.1 Cr on admission elevated at 1.5, monitor on IV lasix.  Might be related to heart failure.  Improved on lasix initially. Cr up again today. Plan to hold lasix.   Unwitnessed Fall;  Patient is confuse, he is not able to provide accurate history. Presume mechanical fall.   Paroxysmal A fib;  Holding eliquis.  Does not appears to be on rate controlled medications.  Resume eliquis today.    Hypokalemia;  Replete  orally.    Vocal cord cancer S/P tracheostomy.  tracheostomy in place.   Thrombocytopenia; mild monitor.   Estimated body mass index is 27.72 kg/m as calculated from the following:   Height as of this encounter: 5\' 6"  (1.676 m).   Weight as of this encounter: 77.9 kg.   DVT prophylaxis: SCD Code Status: full code Family Communication: Daughter Updated.  Disposition Plan: remain in the hospital.   Consultants:   Cardiology  Orthopedic.    Procedures:   none   Antimicrobials:   none   Subjective: He is alert, denies dyspnea.  Objective: Vitals:   11/19/18 0135 11/19/18 0406 11/19/18 0638 11/19/18 0645  BP: (!) 100/47 (!) 107/53 99/79   Pulse: (!) 55 68 (!) 108 98  Resp: 18 18 18    Temp: (!) 97.5 F (36.4 C) 98 F (36.7 C) 97.8 F (36.6 C)   TempSrc: Oral Oral Oral   SpO2: 92% 97% 95%   Weight:      Height:        Intake/Output Summary (Last 24 hours) at 11/19/2018 1115 Last data filed at 11/19/2018 8016 Gross per 24 hour  Intake 848.89 ml  Output 675 ml  Net 173.89 ml   Filed Weights   11/18/18 0500 11/18/18 1310 11/18/18 1607  Weight: 77.9 kg 77.9 kg 77.9 kg    Examination:  General exam: BAD Respiratory system: Tracheostomy , crackles bases Cardiovascular system: S 1, S 2 RRR Gastrointestinal system: BS present, soft, nt Central nervous system: alert.  Extremities: trace edema, left hip with dressing Skin: No rashes, lesions or ulcers    Data Reviewed: I have personally reviewed  following labs and imaging studies  CBC: Recent Labs  Lab 11/17/18 0118 11/18/18 0343 11/19/18 0254  WBC 7.4 8.5 9.9  NEUTROABS 5.6  --   --   HGB 15.4 14.4 13.5  HCT 49.0 44.4 40.7  MCV 101.0* 97.8 100.2*  PLT 150 126* 628*   Basic Metabolic Panel: Recent Labs  Lab 11/17/18 0118 11/17/18 0329 11/18/18 0343 11/19/18 0254  NA 141 142 143 141  K 3.4* 3.3* 3.4* 3.4*  CL 107 108 111 103  CO2 23 19* 22 23  GLUCOSE 111* 115* 171* 144*  BUN 27*  29* 21 23  CREATININE 1.50* 1.58* 1.34* 1.51*  CALCIUM 8.8* 8.7* 8.6* 8.5*  MG  --  2.2  --   --    GFR: Estimated Creatinine Clearance: 31.3 mL/min (A) (by C-G formula based on SCr of 1.51 mg/dL (H)). Liver Function Tests: No results for input(s): AST, ALT, ALKPHOS, BILITOT, PROT, ALBUMIN in the last 168 hours. No results for input(s): LIPASE, AMYLASE in the last 168 hours. No results for input(s): AMMONIA in the last 168 hours. Coagulation Profile: Recent Labs  Lab 11/17/18 0118  INR 1.4*   Cardiac Enzymes: Recent Labs  Lab 11/17/18 0329  TROPONINI 0.03*   BNP (last 3 results) No results for input(s): PROBNP in the last 8760 hours. HbA1C: No results for input(s): HGBA1C in the last 72 hours. CBG: No results for input(s): GLUCAP in the last 168 hours. Lipid Profile: No results for input(s): CHOL, HDL, LDLCALC, TRIG, CHOLHDL, LDLDIRECT in the last 72 hours. Thyroid Function Tests: No results for input(s): TSH, T4TOTAL, FREET4, T3FREE, THYROIDAB in the last 72 hours. Anemia Panel: No results for input(s): VITAMINB12, FOLATE, FERRITIN, TIBC, IRON, RETICCTPCT in the last 72 hours. Sepsis Labs: No results for input(s): PROCALCITON, LATICACIDVEN in the last 168 hours.  Recent Results (from the past 240 hour(s))  Surgical pcr screen     Status: None   Collection Time: 11/17/18  3:09 AM  Result Value Ref Range Status   MRSA, PCR NEGATIVE NEGATIVE Final   Staphylococcus aureus NEGATIVE NEGATIVE Final    Comment: (NOTE) The Xpert SA Assay (FDA approved for NASAL specimens in patients 59 years of age and older), is one component of a comprehensive surveillance program. It is not intended to diagnose infection nor to guide or monitor treatment. Performed at [facility], [place] [address] [place], [place] [address]          Radiology Studies: Pelvis Portable  Result Date: 11/18/2018 CLINICAL DATA:  Status post left hip replacement EXAM: PORTABLE PELVIS 1-2 VIEWS  COMPARISON:  None. FINDINGS: Generalized osteopenia. Interval left hip arthroplasty without hardware failure or complication. Postsurgical changes in the surrounding soft tissues. No acute fracture or dislocation. Mild osteoarthritis of the right hip. IMPRESSION: Interval left hip arthroplasty. Electronically Signed   By: Kathreen Devoid   On: 11/18/2018 10:18   Dg C-arm 1-60 Min  Result Date: 11/18/2018 CLINICAL DATA:  Left hip replacement EXAM: OPERATIVE left HIP (WITH PELVIS IF PERFORMED) 2 VIEWS TECHNIQUE: Fluoroscopic spot image(s) were submitted for interpretation post-operatively. COMPARISON:  For a 2020 FINDINGS: Bipolar hip replacement on the left. Components appear well positioned. No radiographically detectable complication. IMPRESSION: Good appearance following bipolar hip replacement on the left. Electronically Signed   By: Nelson Chimes M.D.   On: 11/18/2018 09:37   Dg Hip Operative Unilat W Or W/o Pelvis Left  Result Date: 11/18/2018 CLINICAL DATA:  Left hip replacement EXAM: OPERATIVE left  HIP (WITH PELVIS IF PERFORMED) 2 VIEWS TECHNIQUE: Fluoroscopic spot image(s) were submitted for interpretation post-operatively. COMPARISON:  For a 2020 FINDINGS: Bipolar hip replacement on the left. Components appear well positioned. No radiographically detectable complication. IMPRESSION: Good appearance following bipolar hip replacement on the left. Electronically Signed   By: Nelson Chimes M.D.   On: 11/18/2018 09:37        Scheduled Meds: . acetaminophen  500 mg Oral Q6H  . apixaban  5 mg Oral BID  . docusate sodium  100 mg Oral BID  . furosemide  20 mg Oral QPM  . furosemide  40 mg Oral Daily  . polyethylene glycol  17 g Oral Daily  . potassium chloride  40 mEq Oral Once   Continuous Infusions: . methocarbamol (ROBAXIN) IV Stopped (11/18/18 2206)     LOS: 2 days    Time spent:35 minutes     Elmarie Shiley, MD [facility] Pager [phone]  If 7PM-7AM, please  contact night-coverage [email or web address] Password [facility] 11/19/2018, 11:15 AM

## 2018-11-19 NOTE — Progress Notes (Signed)
DAILY PROGRESS NOTE   Patient Name: Timothy Suarez Date of Encounter: 11/19/2018 Cardiologist: Fransico Him, MD  Chief Complaint   Somnolent  Patient Profile   Timothy Suarez is a 85 y.o. male w/ h/o laryngeal cancer s/p tracheostomy and uses a passy muir speaking valveto communicate, as well as h/o colon cancer, MVP w/ severe MR s/p minimally invasive mitral valve repair, nonobstructive CAD by cath at time of valve repair (50-70% LAD lesion), chronic systolic HF/DCM w/ EF of 56-43%, PAF s/p Maze procedure, chronic anticoagulation w/ Eliquis, thoracic aortic aneurysm, second-degree AV block type I and II, RBBB, HTN, HLD, h/o CVA and mild cognitive impairment, admitted after a unwitnessed, presumed, mechanical fall resulting in left femoral neck fracture. He is being seen today for preoperative evaluation/cardiac risk assessment for hip arthoplasty, at the request of Dr. Tyrell Antonio, Internal Medicine and Dr. Erlinda Hong, Orthopedics.  Subjective   S/p femoral neck fracture. Sitting up in bed. speaking valve not in place. Creatinine elevated today.  Objective   Vitals:   11/19/18 0135 11/19/18 0406 11/19/18 0638 11/19/18 0645  BP: (!) 100/47 (!) 107/53 99/79   Pulse: (!) 55 68 (!) 108 98  Resp: 18 18 18    Temp: (!) 97.5 F (36.4 C) 98 F (36.7 C) 97.8 F (36.6 C)   TempSrc: Oral Oral Oral   SpO2: 92% 97% 95%   Weight:      Height:        Intake/Output Summary (Last 24 hours) at 11/19/2018 1105 Last data filed at 11/19/2018 3295 Gross per 24 hour  Intake 848.89 ml  Output 675 ml  Net 173.89 ml   Filed Weights   11/18/18 0500 11/18/18 1310 11/18/18 1607  Weight: 77.9 kg 77.9 kg 77.9 kg    Physical Exam   GEN: Well nourished, well developed, in no acute distress  HEENT: normal  Neck: no JVD, carotid bruits, or masses Cardiac: iRRR; no murmurs, rubs, or gallops,no edema  Respiratory:  clear to auscultation bilaterally, normal work of breathing GI: soft, nontender,  nondistended, + BS MS: no deformity or atrophy  Skin: warm and dry Neuro:  Strength and sensation are intact Psych: euthymic mood, full affect   Inpatient Medications    Scheduled Meds: . acetaminophen  500 mg Oral Q6H  . apixaban  5 mg Oral BID  . docusate sodium  100 mg Oral BID  . furosemide  20 mg Oral QPM  . furosemide  40 mg Oral Daily  . polyethylene glycol  17 g Oral Daily  . potassium chloride  40 mEq Oral Once    Continuous Infusions: . methocarbamol (ROBAXIN) IV Stopped (11/18/18 2206)    PRN Meds: acetaminophen, alum & mag hydroxide-simeth, HYDROcodone-acetaminophen, HYDROcodone-acetaminophen, LORazepam, magnesium citrate, menthol-cetylpyridinium **OR** phenol, methocarbamol **OR** methocarbamol (ROBAXIN) IV, morphine injection, polyethylene glycol, senna-docusate, sorbitol   Labs   Results for orders placed or performed during the hospital encounter of 11/16/18 (from the past 48 hour(s))  CBC     Status: Abnormal   Collection Time: 11/18/18  3:43 AM  Result Value Ref Range   WBC 8.5 4.0 - 10.5 K/uL   RBC 4.54 4.22 - 5.81 MIL/uL   Hemoglobin 14.4 13.0 - 17.0 g/dL   HCT 44.4 39.0 - 52.0 %   MCV 97.8 80.0 - 100.0 fL   MCH 31.7 26.0 - 34.0 pg   MCHC 32.4 30.0 - 36.0 g/dL   RDW 14.0 11.5 - 15.5 %   Platelets 126 (L) 150 - 400  K/uL   nRBC 0.0 0.0 - 0.2 %    Comment: Performed at [facility], [place] [address] [place], [place] [address]  Basic metabolic panel     Status: Abnormal   Collection Time: 11/18/18  3:43 AM  Result Value Ref Range   Sodium 143 135 - 145 mmol/L   Potassium 3.4 (L) 3.5 - 5.1 mmol/L   Chloride 111 98 - 111 mmol/L   CO2 22 22 - 32 mmol/L   Glucose, Bld 171 (H) 70 - 99 mg/dL   BUN 21 8 - 23 mg/dL   Creatinine, Ser 1.34 (H) 0.61 - 1.24 mg/dL   Calcium 8.6 (L) 8.9 - 10.3 mg/dL   GFR calc non Af Amer 46 (L) >60 mL/min   GFR calc Af Amer 53 (L) >60 mL/min   Anion gap 10 5 - 15    Comment: Performed at [facility] [address], [place], [place] [address]  CBC     Status: Abnormal   Collection Time: 11/19/18  2:54 AM  Result Value Ref Range   WBC 9.9 4.0 - 10.5 K/uL   RBC 4.06 (L) 4.22 - 5.81 MIL/uL   Hemoglobin 13.5 13.0 - 17.0 g/dL   HCT 40.7 39.0 - 52.0 %   MCV 100.2 (H) 80.0 - 100.0 fL   MCH 33.3 26.0 - 34.0 pg   MCHC 33.2 30.0 - 36.0 g/dL   RDW 13.9 11.5 - 15.5 %   Platelets 116 (L) 150 - 400 K/uL    Comment: REPEATED TO VERIFY PLATELET COUNT CONFIRMED BY SMEAR Immature Platelet Fraction may be clinically indicated, consider ordering this additional test [id number]    nRBC 0.2 0.0 - 0.2 %    Comment: Performed at [facility], [place] [address], [place], [place] [address]  Basic metabolic panel     Status: Abnormal   Collection Time: 11/19/18  2:54 AM  Result Value Ref Range   Sodium 141 135 - 145 mmol/L   Potassium 3.4 (L) 3.5 - 5.1 mmol/L   Chloride 103 98 - 111 mmol/L   CO2 23 22 - 32 mmol/L   Glucose, Bld 144 (H) 70 - 99 mg/dL   BUN 23 8 - 23 mg/dL   Creatinine, Ser 1.51 (H) 0.61 - 1.24 mg/dL   Calcium 8.5 (L) 8.9 - 10.3 mg/dL   GFR calc non Af Amer 40 (L) >60 mL/min   GFR calc Af Amer 46 (L) >60 mL/min   Anion gap 15 5 - 15    Comment: Performed at [facility] [address] [place], [place] [address]    ECG   N/A  Telemetry   Afib with CVR, PVC's - Personally Reviewed  Radiology    Pelvis Portable  Result Date: 11/18/2018 CLINICAL DATA:  Status post left hip replacement EXAM: PORTABLE PELVIS 1-2 VIEWS COMPARISON:  None. FINDINGS: Generalized osteopenia. Interval left hip arthroplasty without hardware failure or complication. Postsurgical changes in the surrounding soft tissues. No acute fracture or dislocation. Mild osteoarthritis of the right hip. IMPRESSION: Interval left hip arthroplasty. Electronically Signed   By: Kathreen Devoid   On: 11/18/2018 10:18   Dg C-arm 1-60 Min  Result Date: 11/18/2018 CLINICAL DATA:  Left hip replacement EXAM: OPERATIVE left HIP  (WITH PELVIS IF PERFORMED) 2 VIEWS TECHNIQUE: Fluoroscopic spot image(s) were submitted for interpretation post-operatively. COMPARISON:  For a 2020 FINDINGS: Bipolar hip replacement on the left. Components appear well positioned. No radiographically detectable  complication. IMPRESSION: Good appearance following bipolar hip replacement on the left. Electronically Signed   By: Nelson Chimes M.D.   On: 11/18/2018 09:37   Dg Hip Operative Unilat W Or W/o Pelvis Left  Result Date: 11/18/2018 CLINICAL DATA:  Left hip replacement EXAM: OPERATIVE left HIP (WITH PELVIS IF PERFORMED) 2 VIEWS TECHNIQUE: Fluoroscopic spot image(s) were submitted for interpretation post-operatively. COMPARISON:  For a 2020 FINDINGS: Bipolar hip replacement on the left. Components appear well positioned. No radiographically detectable complication. IMPRESSION: Good appearance following bipolar hip replacement on the left. Electronically Signed   By: Nelson Chimes M.D.   On: 11/18/2018 09:37    Cardiac Studies   N/A  Assessment   Principal Problem:   Closed displaced fracture of left femoral neck (HCC) Active Problems:   Paroxysmal atrial fibrillation (HCC)   Chronic systolic congestive heart failure ([place])   Fall   AKI (acute kidney injury) ([place])   Plan   1. Creatinine back up today. Will plan to hold off on diuresis. Patient net negative 1.2 L.   Will Martin Camnitz 11/19/2018, 11:05 AM

## 2018-11-19 NOTE — Discharge Instructions (Signed)
° ° °  1. Change dressings as needed 2. May shower but keep incisions covered and dry 3. Take eliquis to prevent blood clots 4. Take stool softeners as needed 5. Take pain meds as needed   Information on my medicine - ELIQUIS (apixaban)   Why was Eliquis prescribed for you? Eliquis was prescribed for you to reduce the risk of a blood clot forming that can cause a stroke if you have a medical condition called atrial fibrillation (a type of irregular heartbeat).  What do You need to know about Eliquis ? Take your Eliquis TWICE DAILY - one tablet in the morning and one tablet in the evening with or without food. If you have difficulty swallowing the tablet whole please discuss with your pharmacist how to take the medication safely.  Take Eliquis exactly as prescribed by your doctor and DO NOT stop taking Eliquis without talking to the doctor who prescribed the medication.  Stopping may increase your risk of developing a stroke.  Refill your prescription before you run out.  After discharge, you should have regular check-up appointments with your healthcare provider that is prescribing your Eliquis.  In the future your dose may need to be changed if your kidney function or weight changes by a significant amount or as you get older.  What do you do if you miss a dose? If you miss a dose, take it as soon as you remember on the same day and resume taking twice daily.  Do not take more than one dose of ELIQUIS at the same time to make up a missed dose.  Important Safety Information A possible side effect of Eliquis is bleeding. You should call your healthcare provider right away if you experience any of the following: ? Bleeding from an injury or your nose that does not stop. ? Unusual colored urine (red or dark brown) or unusual colored stools (red or black). ? Unusual bruising for unknown reasons. ? A serious fall or if you hit your head (even if there is no bleeding).  Some medicines  may interact with Eliquis and might increase your risk of bleeding or clotting while on Eliquis. To help avoid this, consult your healthcare provider or pharmacist prior to using any new prescription or non-prescription medications, including herbals, vitamins, non-steroidal anti-inflammatory drugs (NSAIDs) and supplements.  This website has more information on Eliquis (apixaban): http://www.eliquis.com/eliquis/home 

## 2018-11-20 LAB — BASIC METABOLIC PANEL
Anion gap: 11 (ref 5–15)
BUN: 27 mg/dL — ABNORMAL HIGH (ref 8–23)
CO2: 23 mmol/L (ref 22–32)
Calcium: 8.5 mg/dL — ABNORMAL LOW (ref 8.9–10.3)
Chloride: 108 mmol/L (ref 98–111)
Creatinine, Ser: 1.43 mg/dL — ABNORMAL HIGH (ref 0.61–1.24)
GFR calc Af Amer: 49 mL/min — ABNORMAL LOW (ref >60)
GFR calc non Af Amer: 43 mL/min — ABNORMAL LOW (ref >60)
Glucose, Bld: 99 mg/dL (ref 70–99)
Potassium: 3.4 mmol/L — ABNORMAL LOW (ref 3.5–5.1)
Sodium: 142 mmol/L (ref 135–145)

## 2018-11-20 LAB — GLUCOSE, CAPILLARY
Glucose-Capillary: 76 mg/dL (ref 70–99)
Glucose-Capillary: 82 mg/dL (ref 70–99)
Glucose-Capillary: 70 mg/dL (ref 70–99)

## 2018-11-20 LAB — CBC
HCT: 42.5 % (ref 39.0–52.0)
Hemoglobin: 13.8 g/dL (ref 13.0–17.0)
MCH: 31.9 pg (ref 26.0–34.0)
MCHC: 32.5 g/dL (ref 30.0–36.0)
MCV: 98.2 fL (ref 80.0–100.0)
Platelets: 120 10*3/uL — ABNORMAL LOW (ref 150–400)
RBC: 4.33 MIL/uL (ref 4.22–5.81)
RDW: 13.5 % (ref 11.5–15.5)
WBC: 9.8 10*3/uL (ref 4.0–10.5)
nRBC: 0 % (ref 0.0–0.2)

## 2018-11-20 MED ORDER — POTASSIUM CHLORIDE CRYS ER 20 MEQ PO TBCR
20.0000 meq | EXTENDED_RELEASE_TABLET | Freq: Once | ORAL | Status: AC
  Administered 2018-11-20: 20 meq via ORAL
  Filled 2018-11-20: qty 1

## 2018-11-20 NOTE — Progress Notes (Signed)
DAILY PROGRESS NOTE   Patient Name: Timothy Suarez Date of Encounter: 11/20/2018 Cardiologist: Fransico Him, MD  Chief Complaint   Feeling well  Patient Profile   Timothy Suarez is a 85 y.o. male w/ h/o laryngeal cancer s/p tracheostomy and uses a passy muir speaking valveto communicate, as well as h/o colon cancer, MVP w/ severe MR s/p minimally invasive mitral valve repair, nonobstructive CAD by cath at time of valve repair (50-70% LAD lesion), chronic systolic HF/DCM w/ EF of 37-62%, PAF s/p Maze procedure, chronic anticoagulation w/ Eliquis, thoracic aortic aneurysm, second-degree AV block type I and II, RBBB, HTN, HLD, h/o CVA and mild cognitive impairment, admitted after a unwitnessed, presumed, mechanical fall resulting in left femoral neck fracture. He is being seen today for preoperative evaluation/cardiac risk assessment for hip arthoplasty, at the request of Dr. Tyrell Antonio, Internal Medicine and Dr. Erlinda Hong, Orthopedics.  Subjective   S/p femoral neck fracture. Today in no acute distess  Objective   Vitals:   11/19/18 0645 11/19/18 1315 11/19/18 1957 11/20/18 0328  BP:  112/67 122/65 118/62  Pulse: 98 71 87 78  Resp:  18    Temp:  98.2 F (36.8 C) 97.6 F (36.4 C) 97.8 F (36.6 C)  TempSrc:  Oral Oral Oral  SpO2:  96% 99% 95%  Weight:      Height:        Intake/Output Summary (Last 24 hours) at 11/20/2018 1026 Last data filed at 11/20/2018 0700 Gross per 24 hour  Intake 600 ml  Output 775 ml  Net -175 ml   Filed Weights   11/18/18 0500 11/18/18 1310 11/18/18 1607  Weight: 77.9 kg 77.9 kg 77.9 kg    Physical Exam   GEN: Well nourished, well developed, in no acute distress  HEENT: normal  Neck: no JVD, carotid bruits, or masses Cardiac: iRRR; no murmurs, rubs, or gallops,no edema  Respiratory:  clear to auscultation bilaterally, normal work of breathing GI: soft, nontender, nondistended, + BS MS: no deformity or atrophy  Skin: warm and dry Neuro:   Strength and sensation are intact Psych: euthymic mood, full affect   Inpatient Medications    Scheduled Meds: . apixaban  5 mg Oral BID  . docusate sodium  100 mg Oral BID  . polyethylene glycol  17 g Oral Daily  . potassium chloride  40 mEq Oral Once    Continuous Infusions: . methocarbamol (ROBAXIN) IV Stopped (11/18/18 2206)    PRN Meds: acetaminophen, alum & mag hydroxide-simeth, HYDROcodone-acetaminophen, HYDROcodone-acetaminophen, LORazepam, magnesium citrate, menthol-cetylpyridinium **OR** phenol, methocarbamol **OR** methocarbamol (ROBAXIN) IV, morphine injection, polyethylene glycol, senna-docusate, sorbitol   Labs   Results for orders placed or performed during the hospital encounter of 11/16/18 (from the past 48 hour(s))  CBC     Status: Abnormal   Collection Time: 11/19/18  2:54 AM  Result Value Ref Range   WBC 9.9 4.0 - 10.5 K/uL   RBC 4.06 (L) 4.22 - 5.81 MIL/uL   Hemoglobin 13.5 13.0 - 17.0 g/dL   HCT 40.7 39.0 - 52.0 %   MCV 100.2 (H) 80.0 - 100.0 fL   MCH 33.3 26.0 - 34.0 pg   MCHC 33.2 30.0 - 36.0 g/dL   RDW 13.9 11.5 - 15.5 %   Platelets 116 (L) 150 - 400 K/uL    Comment: REPEATED TO VERIFY PLATELET COUNT CONFIRMED BY SMEAR Immature Platelet Fraction may be clinically indicated, consider ordering this additional test [id number]    nRBC 0.2 0.0 -  0.2 %    Comment: Performed at [facility], [place] [address] [place], [place] [address]  Basic metabolic panel     Status: Abnormal   Collection Time: 11/19/18  2:54 AM  Result Value Ref Range   Sodium 141 135 - 145 mmol/L   Potassium 3.4 (L) 3.5 - 5.1 mmol/L   Chloride 103 98 - 111 mmol/L   CO2 23 22 - 32 mmol/L   Glucose, Bld 144 (H) 70 - 99 mg/dL   BUN 23 8 - 23 mg/dL   Creatinine, Ser 1.51 (H) 0.61 - 1.24 mg/dL   Calcium 8.5 (L) 8.9 - 10.3 mg/dL   GFR calc non Af Amer 40 (L) >60 mL/min   GFR calc Af Amer 46 (L) >60 mL/min   Anion gap 15 5 - 15    Comment: Performed at [facility] [address] [place], [place] [address]  Glucose, capillary     Status: None   Collection Time: 11/19/18  4:00 PM  Result Value Ref Range   Glucose-Capillary 98 70 - 99 mg/dL  Glucose, capillary     Status: Abnormal   Collection Time: 11/19/18 11:44 PM  Result Value Ref Range   Glucose-Capillary 100 (H) 70 - 99 mg/dL   Comment 1 Document in Chart   CBC     Status: Abnormal   Collection Time: 11/20/18  3:02 AM  Result Value Ref Range   WBC 9.8 4.0 - 10.5 K/uL   RBC 4.33 4.22 - 5.81 MIL/uL   Hemoglobin 13.8 13.0 - 17.0 g/dL   HCT 42.5 39.0 - 52.0 %   MCV 98.2 80.0 - 100.0 fL   MCH 31.9 26.0 - 34.0 pg   MCHC 32.5 30.0 - 36.0 g/dL   RDW 13.5 11.5 - 15.5 %   Platelets 120 (L) 150 - 400 K/uL    Comment: Immature Platelet Fraction may be clinically indicated, consider ordering this additional test [id number]    nRBC 0.0 0.0 - 0.2 %    Comment: Performed at [facility], [place] [address], [place], [place] [address]  Basic metabolic panel     Status: Abnormal   Collection Time: 11/20/18  3:02 AM  Result Value Ref Range   Sodium 142 135 - 145 mmol/L   Potassium 3.4 (L) 3.5 - 5.1 mmol/L   Chloride 108 98 - 111 mmol/L   CO2 23 22 - 32 mmol/L   Glucose, Bld 99 70 - 99 mg/dL   BUN 27 (H) 8 - 23 mg/dL   Creatinine, Ser 1.43 (H) 0.61 - 1.24 mg/dL   Calcium 8.5 (L) 8.9 - 10.3 mg/dL   GFR calc non Af Amer 43 (L) >60 mL/min   GFR calc Af Amer 49 (L) >60 mL/min   Anion gap 11 5 - 15    Comment: Performed at [facility] [address], [place], [place] [address]  Glucose, capillary     Status: None   Collection Time: 11/20/18  8:53 AM  Result Value Ref Range   Glucose-Capillary 76 70 - 99 mg/dL    ECG   N/A  Telemetry   SR with PACs and PVCs - Personally Reviewed  Radiology    No results found.  Cardiac Studies   N/A  Assessment   Principal Problem:   Closed displaced fracture of left femoral neck (HCC) Active Problems:   Paroxysmal atrial fibrillation  (HCC)   Chronic systolic congestive heart failure ([place])  Fall   AKI (acute kidney injury) ([place])   Plan   1. Creatinine trending down. Continuing to hold diuresis.  [facility] Lakyn Alsteen sign off.   Medication Recommendations:  none Other recommendations (labs, testing, etc):  none Follow up as an outpatient:  n/a    Lakynn Halvorsen Meredith Leeds 11/20/2018, 10:26 AM

## 2018-11-20 NOTE — Progress Notes (Signed)
   Subjective: 2 Days Post-Op Procedure(s) (LRB): PATIAL HIP ARTHROPLASTY ANTERIOR APPROACH (Left) Patient reports pain as mild and moderate.    Objective: Vital signs in last 24 hours: Temp:  [97.6 F (36.4 C)-98.2 F (36.8 C)] 97.8 F (36.6 C) (04/12 0328) Pulse Rate:  [71-87] 78 (04/12 0328) Resp:  [18] 18 (04/11 1315) BP: (112-122)/(62-67) 118/62 (04/12 0328) SpO2:  [95 %-99 %] 95 % (04/12 0328)  Intake/Output from previous day: 04/11 0701 - 04/12 0700 In: 600 [P.O.:600] Out: 775 [Urine:775] Intake/Output this shift: No intake/output data recorded.  Recent Labs    11/18/18 0343 11/19/18 0254 11/20/18 0302  HGB 14.4 13.5 13.8   Recent Labs    11/19/18 0254 11/20/18 0302  WBC 9.9 9.8  RBC 4.06* 4.33  HCT 40.7 42.5  PLT 116* 120*   Recent Labs    11/19/18 0254 11/20/18 0302  NA 141 142  K 3.4* 3.4*  CL 103 108  CO2 23 23  BUN 23 27*  CREATININE 1.51* 1.43*  GLUCOSE 144* 99  CALCIUM 8.5* 8.5*   No results for input(s): LABPT, INR in the last 72 hours.  NVI. LL equal, dressing dry No results found.  Assessment/Plan: 2 Days Post-Op Procedure(s) (LRB): PATIAL HIP ARTHROPLASTY ANTERIOR APPROACH (Left) Up with therapy  Timothy Suarez 11/20/2018, 10:40 AM

## 2018-11-20 NOTE — Evaluation (Signed)
Occupational Therapy Evaluation Patient Details Name: Timothy Suarez MRN: [id number] DOB: Apr 10, 1925 Today's Date: 11/20/2018    History of Present Illness 85 y.o. male admitted on 11/16/18 after falling at [facility] and sustaining a L femoral neck fx for which he underwent a L direct anterior THA on 11/18/18 and is WBAT post op.  Pt has significant PMH of thrombocytopenia, wandering aortic pacemaker, R vocal cord CA with trach, RBB, PVCs, PAF, MVP, HTN, glaucoma, CAD, colon CA, CKD, cerebral ischemia, R TKA, MVR, laryngectomy, and colectomy.     Clinical Impression   Pt PTA: living in ALF and fell. ADL/IADL provided for him. Currently, pt performing bed mobility with modA; ADL functional mobility with MaxA for sit to stand and taking a few shuffled steps, decreased step height from bed to recliner. Pt modA for UB ADL, maxA for LB ADL.  Pt following commands, using speaking device and hard to understand him. Pt would greatly benefit from continued OT skilled services for ADL, mobility and safety in SNF setting. OT to follow acutely.    Follow Up Recommendations  SNF;Supervision/Assistance - 24 hour    Equipment Recommendations  Other (comment)(to be determined)    Recommendations for Other Services       Precautions / Restrictions Precautions Precautions: Fall Restrictions Weight Bearing Restrictions: No      Mobility Bed Mobility Overal bed mobility: Needs Assistance Bed Mobility: Supine to Sit     Supine to sit: Mod assist;HOB elevated     General bed mobility comments: Mod A for LB maneuvering towards EOB  Transfers Overall transfer level: Needs assistance Equipment used: Rolling walker (2 wheeled) Transfers: Sit to/from Stand Sit to Stand: Max assist;From elevated surface         General transfer comment: Pt stood x1 before transfer as LLE not moving at all. pt maxA and able to take a few steps from bed to recliner    Balance Overall balance assessment:  Needs assistance Sitting-balance support: No upper extremity supported Sitting balance-Leahy Scale: Good     Standing balance support: Bilateral upper extremity supported Standing balance-Leahy Scale: Poor                             ADL either performed or assessed with clinical judgement   ADL Overall ADL's : Needs assistance/impaired Eating/Feeding: Set up;Sitting   Grooming: Set up;Sitting   Upper Body Bathing: Moderate assistance;Sitting   Lower Body Bathing: Maximal assistance;Total assistance;Sit to/from stand;Sitting/lateral leans   Upper Body Dressing : Moderate assistance;Sitting   Lower Body Dressing: Maximal assistance;Total assistance;Sitting/lateral leans;Sit to/from stand   Toilet Transfer: Maximal assistance;Stand-pivot;RW;BSC;+2 for physical assistance;+2 for safety/equipment   Toileting- Clothing Manipulation and Hygiene: Maximal assistance;Total assistance;+2 for physical assistance;+2 for safety/equipment;Sitting/lateral lean;Sit to/from stand       Functional mobility during ADLs: Moderate assistance;+2 for physical assistance;+2 for safety/equipment;Rolling walker       Vision Baseline Vision/History: Wears glasses Vision Assessment?: No apparent visual deficits     Perception     Praxis      Pertinent Vitals/Pain Pain Assessment: Faces Faces Pain Scale: Hurts even more Pain Location: left hip Pain Descriptors / Indicators: Grimacing;Guarding Pain Intervention(s): Limited activity within patient's tolerance     Hand Dominance Right   Extremity/Trunk Assessment Upper Extremity Assessment Upper Extremity Assessment: Generalized weakness   Lower Extremity Assessment Lower Extremity Assessment: Defer to PT evaluation;LLE deficits/detail LLE Deficits / Details: weakness s/p L Hip hemi  Cervical / Trunk Assessment Cervical / Trunk Assessment: Normal   Communication Communication Communication: HOH   Cognition  Arousal/Alertness: Awake/alert Behavior During Therapy: Restless Overall Cognitive Status: Impaired/Different from baseline Area of Impairment: Orientation;Attention;Memory;Following commands;Safety/judgement;Awareness;Problem solving                   Current Attention Level: Sustained Memory: Decreased short-term memory;Decreased recall of precautions Following Commands: Follows one step commands consistently Safety/Judgement: Decreased awareness of safety;Decreased awareness of deficits Awareness: Intellectual   General Comments: Pt agreeable to therapy today, but unaware of situation and how he got here.Pt unaware of hip sx.   General Comments       Exercises     Shoulder Instructions      Home Living Family/patient expects to be discharged to:: Skilled nursing facility                                 Additional Comments: Per daughter, Jackelyn Poling, the family is seeking SNF for rehab.      Prior Functioning/Environment Level of Independence: Independent with assistive device(s);Needs assistance  Gait / Transfers Assistance Needed: per Jackelyn Poling, pt uses rollator at baseline, but is "wobbly on his feet"   Communication / Swallowing Assistance Needed: very HOH, uses an electrolarynx.          OT Problem List: Decreased strength;Decreased range of motion;Decreased activity tolerance;Impaired balance (sitting and/or standing);Decreased coordination;Decreased safety awareness;Decreased cognition;Decreased knowledge of precautions;Increased edema;Pain      OT Treatment/Interventions: Self-care/ADL training;Therapeutic exercise;Neuromuscular education;Energy conservation;DME and/or AE instruction;Therapeutic activities;Patient/family education;Balance training    OT Goals(Current goals can be found in the care plan section) Acute Rehab OT Goals Patient Stated Goal: Family would like him to go to SNF for rehab OT Goal Formulation: With patient Time For Goal  Achievement: 12/04/18 Potential to Achieve Goals: Good ADL Goals Pt Will Perform Grooming: with modified independence;sitting Pt Will Transfer to Toilet: with min assist;stand pivot transfer;ambulating Additional ADL Goal #1: Pt will perform  x5 mins dynamic standing fair balance task for ADL.  OT Frequency: Min 3X/week   Barriers to D/C:            Co-evaluation              AM-PAC OT "6 Clicks" Daily Activity     Outcome Measure Help from another person eating meals?: None Help from another person taking care of personal grooming?: A Little Help from another person toileting, which includes using toliet, bedpan, or urinal?: A Lot Help from another person bathing (including washing, rinsing, drying)?: A Lot Help from another person to put on and taking off regular upper body clothing?: A Little Help from another person to put on and taking off regular lower body clothing?: A Lot 6 Click Score: 16   End of Session Equipment Utilized During Treatment: Gait belt;Rolling walker Nurse Communication: Mobility status  Activity Tolerance: Patient tolerated treatment well Patient left: in chair;with call bell/phone within reach;with chair alarm set  OT Visit Diagnosis: Unsteadiness on feet (R26.81);Other abnormalities of gait and mobility (R26.89);Muscle weakness (generalized) (M62.81)                Time: 2229-7989 OT Time Calculation (min): 43 min Charges:  OT General Charges $OT Visit: 1 Visit OT Evaluation $OT Eval Moderate Complexity: 1 Mod OT Treatments $Self Care/Home Management : 8-22 mins $Neuromuscular Re-education: 8-22 mins  Ebony Hail Harold Hedge) Marsa Aris OTR/L Acute Rehabilitation Services Pager: [phone] Office:  [place] 11/20/2018, 3:12 PM

## 2018-11-20 NOTE — Progress Notes (Addendum)
PROGRESS NOTE    Timothy Suarez  TOI:[id number] DOB: 1925-03-06 DOA: 11/16/2018 PCP: Gayland Curry, DO   Brief Narrative: Timothy Suarez is a 85 y.o. male with medical history significant of CKD stage III, nonobstructive CAD, chronic systolic CHF with dilated cardiomyopathy (EF 20 to 25%), second-degree AV block type I and II, mitral valve prolapse with severe mitral regurgitation status post repair, paroxysmal atrial fibrillation, thoracic aortic aneurysm, hypertension, hyperlipidemia, mild cognitive impairment, CVA, colon cancer, vocal cord cancer status post tracheostomy presenting to the hospital via EMS for evaluation of an unwitnessed fall.   Assessment & Plan:   Principal Problem:   Closed displaced fracture of left femoral neck (HCC) Active Problems:   Paroxysmal atrial fibrillation (HCC)   Chronic systolic congestive heart failure ([place])   Fall   AKI (acute kidney injury) ([place])  Left femoral neck fracture, sub capital ;  Presume mechanical fall.  Poor historian.  Pain medication available.  Orthopedic consulted.  Cardiology consulted, pre-op evaluation and management of heart failure.  Appreciate cardio evaluation.  Patient underwent sx prosthetic replacement for femoral neck fracture.  Up with PT today. Hopefully SNF tomorrow.   Acute systolic Heart failure exacerbation. EF 20--25 % Chest x ray with pulmonary edema, BNP elevated.  Cr up, cardiology recommending holding lasix. Patient received oral dose lasix 4-11. Cardiology consulted. Recommending to hold lasix today.  Negative 975.  AKI on CKD stage III Prior cr 1.1 Cr on admission elevated at 1.5, monitor on IV lasix.  Might be related to heart failure.  Improved on lasix initially. Cr up again today. Plan to hold lasix.  Cr improved today.   Unwitnessed Fall;  Patient is confuse, he is not able to provide accurate history. Presume mechanical fall.   Paroxysmal A fib;  Holding eliquis.  Does not  appears to be on rate controlled medications.  Back on eliquis.    Hypokalemia;  Resolved.    Vocal cord cancer S/P tracheostomy.  tracheostomy in place.  Respiratory therapy proving local care.   Thrombocytopenia; mild monitor. Improved.    Estimated body mass index is 27.72 kg/m as calculated from the following:   Height as of this encounter: 5\' 6"  (1.676 m).   Weight as of this encounter: 77.9 kg.   DVT prophylaxis: SCD Code Status: full code Family Communication: Daughter Updated.  Disposition Plan: remain in the hospital.   Consultants:   Cardiology  Orthopedic.    Procedures:   none   Antimicrobials:   none   Subjective: He is alert, in no distress. Denies dyspnea.  Confuse.  passy muir valve not working well.   Objective: Vitals:   11/19/18 0645 11/19/18 1315 11/19/18 1957 11/20/18 0328  BP:  112/67 122/65 118/62  Pulse: 98 71 87 78  Resp:  18    Temp:  98.2 F (36.8 C) 97.6 F (36.4 C) 97.8 F (36.6 C)  TempSrc:  Oral Oral Oral  SpO2:  96% 99% 95%  Weight:      Height:        Intake/Output Summary (Last 24 hours) at 11/20/2018 1322 Last data filed at 11/20/2018 1030 Gross per 24 hour  Intake 840 ml  Output 625 ml  Net 215 ml   Filed Weights   11/18/18 0500 11/18/18 1310 11/18/18 1607  Weight: 77.9 kg 77.9 kg 77.9 kg    Examination:  General exam: NAD Respiratory system: Tracheostomy , CTA Cardiovascular system: S 1, S 2 RRR Gastrointestinal system: BS present, soft,  nt Central nervous system: alert.  Extremities: trace edema, left hip with dressing.  Skin: No rashes, lesions or ulcers    Data Reviewed: I have personally reviewed following labs and imaging studies  CBC: Recent Labs  Lab 11/17/18 0118 11/18/18 0343 11/19/18 0254 11/20/18 0302  WBC 7.4 8.5 9.9 9.8  NEUTROABS 5.6  --   --   --   HGB 15.4 14.4 13.5 13.8  HCT 49.0 44.4 40.7 42.5  MCV 101.0* 97.8 100.2* 98.2  PLT 150 126* 116* 762*   Basic Metabolic  Panel: Recent Labs  Lab 11/17/18 0118 11/17/18 0329 11/18/18 0343 11/19/18 0254 11/20/18 0302  NA 141 142 143 141 142  K 3.4* 3.3* 3.4* 3.4* 3.4*  CL 107 108 111 103 108  CO2 23 19* 22 23 23   GLUCOSE 111* 115* 171* 144* 99  BUN 27* 29* 21 23 27*  CREATININE 1.50* 1.58* 1.34* 1.51* 1.43*  CALCIUM 8.8* 8.7* 8.6* 8.5* 8.5*  MG  --  2.2  --   --   --    GFR: Estimated Creatinine Clearance: 33 mL/min (A) (by C-G formula based on SCr of 1.43 mg/dL (H)). Liver Function Tests: No results for input(s): AST, ALT, ALKPHOS, BILITOT, PROT, ALBUMIN in the last 168 hours. No results for input(s): LIPASE, AMYLASE in the last 168 hours. No results for input(s): AMMONIA in the last 168 hours. Coagulation Profile: Recent Labs  Lab 11/17/18 0118  INR 1.4*   Cardiac Enzymes: Recent Labs  Lab 11/17/18 0329  TROPONINI 0.03*   BNP (last 3 results) No results for input(s): PROBNP in the last 8760 hours. HbA1C: No results for input(s): HGBA1C in the last 72 hours. CBG: Recent Labs  Lab 11/19/18 1600 11/19/18 2344 11/20/18 0853  GLUCAP 98 100* 76   Lipid Profile: No results for input(s): CHOL, HDL, LDLCALC, TRIG, CHOLHDL, LDLDIRECT in the last 72 hours. Thyroid Function Tests: No results for input(s): TSH, T4TOTAL, FREET4, T3FREE, THYROIDAB in the last 72 hours. Anemia Panel: No results for input(s): VITAMINB12, FOLATE, FERRITIN, TIBC, IRON, RETICCTPCT in the last 72 hours. Sepsis Labs: No results for input(s): PROCALCITON, LATICACIDVEN in the last 168 hours.  Recent Results (from the past 240 hour(s))  Surgical pcr screen     Status: None   Collection Time: 11/17/18  3:09 AM  Result Value Ref Range Status   MRSA, PCR NEGATIVE NEGATIVE Final   Staphylococcus aureus NEGATIVE NEGATIVE Final    Comment: (NOTE) The Xpert SA Assay (FDA approved for NASAL specimens in patients 92 years of age and older), is one component of a comprehensive surveillance program. It is not intended to  diagnose infection nor to guide or monitor treatment. Performed at [facility], [place] [address] [place], [place] [address]          Radiology Studies: No results found.      Scheduled Meds: . apixaban  5 mg Oral BID  . docusate sodium  100 mg Oral BID  . polyethylene glycol  17 g Oral Daily  . potassium chloride  40 mEq Oral Once   Continuous Infusions: . methocarbamol (ROBAXIN) IV Stopped (11/18/18 2206)     LOS: 3 days    Time spent:35 minutes     Elmarie Shiley, MD [facility] Pager [phone]  If 7PM-7AM, please contact night-coverage [email or web address] Password [email or web address] 11/20/2018, 1:22 PM

## 2018-11-20 NOTE — Plan of Care (Signed)
  Problem: Pain Managment: Goal: General experience of comfort will improve Outcome: Progressing   

## 2018-11-21 ENCOUNTER — Inpatient Hospital Stay (HOSPITAL_COMMUNITY): Payer: Medicare Other

## 2018-11-21 ENCOUNTER — Encounter (HOSPITAL_COMMUNITY): Payer: Self-pay | Admitting: Orthopaedic Surgery

## 2018-11-21 LAB — CBC
HCT: 40.6 % (ref 39.0–52.0)
Hemoglobin: 13.4 g/dL (ref 13.0–17.0)
MCH: 32.8 pg (ref 26.0–34.0)
MCHC: 33 g/dL (ref 30.0–36.0)
MCV: 99.5 fL (ref 80.0–100.0)
Platelets: 114 10*3/uL — ABNORMAL LOW (ref 150–400)
RBC: 4.08 MIL/uL — ABNORMAL LOW (ref 4.22–5.81)
RDW: 13.8 % (ref 11.5–15.5)
WBC: 8.6 10*3/uL (ref 4.0–10.5)
nRBC: 0 % (ref 0.0–0.2)

## 2018-11-21 LAB — BASIC METABOLIC PANEL
Anion gap: 8 (ref 5–15)
BUN: 25 mg/dL — ABNORMAL HIGH (ref 8–23)
CO2: 24 mmol/L (ref 22–32)
Calcium: 8.5 mg/dL — ABNORMAL LOW (ref 8.9–10.3)
Chloride: 111 mmol/L (ref 98–111)
Creatinine, Ser: 1.13 mg/dL (ref 0.61–1.24)
GFR calc Af Amer: >60 mL/min (ref >60)
GFR calc non Af Amer: 57 mL/min — ABNORMAL LOW (ref >60)
Glucose, Bld: 85 mg/dL (ref 70–99)
Potassium: 3.7 mmol/L (ref 3.5–5.1)
Sodium: 143 mmol/L (ref 135–145)

## 2018-11-21 LAB — CORTISOL-AM, BLOOD: Cortisol - AM: 12.7 ug/dL (ref 6.7–22.6)

## 2018-11-21 LAB — BLOOD GAS, ARTERIAL
Acid-Base Excess: 0.3 mmol/L (ref 0.0–2.0)
Bicarbonate: 23.4 mmol/L (ref 20.0–28.0)
Drawn by: 441371
FIO2: 21
O2 Saturation: 95.2 %
Patient temperature: 97.9
pCO2 arterial: 30.9 mmHg — ABNORMAL LOW (ref 32.0–48.0)
pH, Arterial: 7.49 — ABNORMAL HIGH (ref 7.350–7.450)
pO2, Arterial: 71.4 mmHg — ABNORMAL LOW (ref 83.0–108.0)

## 2018-11-21 LAB — GLUCOSE, CAPILLARY
Glucose-Capillary: 65 mg/dL — ABNORMAL LOW (ref 70–99)
Glucose-Capillary: 117 mg/dL — ABNORMAL HIGH (ref 70–99)
Glucose-Capillary: 82 mg/dL (ref 70–99)
Glucose-Capillary: 63 mg/dL — ABNORMAL LOW (ref 70–99)
Glucose-Capillary: 135 mg/dL — ABNORMAL HIGH (ref 70–99)
Glucose-Capillary: 58 mg/dL — ABNORMAL LOW (ref 70–99)
Glucose-Capillary: 122 mg/dL — ABNORMAL HIGH (ref 70–99)
Glucose-Capillary: 100 mg/dL — ABNORMAL HIGH (ref 70–99)
Glucose-Capillary: 73 mg/dL (ref 70–99)
Glucose-Capillary: 80 mg/dL (ref 70–99)
Glucose-Capillary: 89 mg/dL (ref 70–99)
Glucose-Capillary: 98 mg/dL (ref 70–99)
Glucose-Capillary: 119 mg/dL — ABNORMAL HIGH (ref 70–99)

## 2018-11-21 LAB — URINALYSIS, ROUTINE W REFLEX MICROSCOPIC
Bilirubin Urine: NEGATIVE
Glucose, UA: 50 mg/dL — AB
Hgb urine dipstick: NEGATIVE
Ketones, ur: NEGATIVE mg/dL
Leukocytes,Ua: NEGATIVE
Nitrite: NEGATIVE
Protein, ur: NEGATIVE mg/dL
Specific Gravity, Urine: 1.013 (ref 1.005–1.030)
pH: 6 (ref 5.0–8.0)

## 2018-11-21 LAB — MAGNESIUM: Magnesium: 2.4 mg/dL (ref 1.7–2.4)

## 2018-11-21 LAB — TROPONIN I: Troponin I: 0.03 ng/mL (ref <0.03)

## 2018-11-21 MED ORDER — LATANOPROST 0.005 % OP SOLN
1.0000 [drp] | Freq: Every day | OPHTHALMIC | Status: DC
  Administered 2018-11-22: 1 [drp] via OPHTHALMIC
  Filled 2018-11-21: qty 2.5

## 2018-11-21 MED ORDER — DEXTROSE 50 % IV SOLN
1.0000 | Freq: Once | INTRAVENOUS | Status: AC
  Administered 2018-11-21: 50 mL via INTRAVENOUS
  Filled 2018-11-21: qty 50

## 2018-11-21 MED ORDER — GLUCAGON HCL RDNA (DIAGNOSTIC) 1 MG IJ SOLR
INTRAMUSCULAR | Status: AC
  Administered 2018-11-21: 1 mg
  Filled 2018-11-21: qty 1

## 2018-11-21 MED ORDER — DEXTROSE 50 % IV SOLN
INTRAVENOUS | Status: AC
  Filled 2018-11-21: qty 50

## 2018-11-21 MED ORDER — ENSURE ENLIVE PO LIQD
237.0000 mL | Freq: Two times a day (BID) | ORAL | Status: DC
  Administered 2018-11-21 – 2018-11-23 (×6): 237 mL via ORAL

## 2018-11-21 MED ORDER — FUROSEMIDE 10 MG/ML IJ SOLN
40.0000 mg | Freq: Two times a day (BID) | INTRAMUSCULAR | Status: AC
  Administered 2018-11-21 (×2): 40 mg via INTRAVENOUS
  Filled 2018-11-21 (×2): qty 4

## 2018-11-21 MED ORDER — NYSTATIN 100000 UNIT/ML MT SUSP
5.0000 mL | Freq: Four times a day (QID) | OROMUCOSAL | Status: DC
  Administered 2018-11-21 – 2018-11-23 (×10): 500000 [IU] via ORAL
  Filled 2018-11-21 (×10): qty 5

## 2018-11-21 MED ORDER — GLUCAGON HCL RDNA (DIAGNOSTIC) 1 MG IJ SOLR
1.0000 mg | Freq: Once | INTRAMUSCULAR | Status: DC | PRN
  Filled 2018-11-21: qty 1

## 2018-11-21 MED ORDER — DEXTROSE 5 % IV SOLN
INTRAVENOUS | Status: DC
  Administered 2018-11-21: 12:00:00 via INTRAVENOUS

## 2018-11-21 MED ORDER — GLUCAGON HCL RDNA (DIAGNOSTIC) 1 MG IJ SOLR
1.0000 mg | Freq: Once | INTRAMUSCULAR | Status: AC | PRN
  Administered 2018-11-21: 1 mg via INTRAVENOUS

## 2018-11-21 MED ORDER — GLUCOSE 40 % PO GEL
ORAL | Status: AC
  Administered 2018-11-21: 10:00:00
  Filled 2018-11-21: qty 1

## 2018-11-21 MED ORDER — NETARSUDIL-LATANOPROST 0.02-0.005 % OP SOLN: 1.0000 [drp] | Freq: Every day | OPHTHALMIC | Status: DC

## 2018-11-21 NOTE — Progress Notes (Signed)
CSW consulted with patient's daughter Hilda Blades regarding bed offers, she reports choosing [facility]. Due to Quarantine procedures [facility] can take patient on Wednesday.   CSW to continue to follow for discharge needs.   [place], [place]

## 2018-11-21 NOTE — Progress Notes (Addendum)
PROGRESS NOTE    Timothy Suarez  VZC:[id number] DOB: May 11, 1925 DOA: 11/16/2018 PCP: Gayland Curry, DO   Brief Narrative: Timothy Suarez is a 85 y.o. male with medical history significant of CKD stage III, nonobstructive CAD, chronic systolic CHF with dilated cardiomyopathy (EF 20 to 25%), second-degree AV block type I and II, mitral valve prolapse with severe mitral regurgitation status post repair, paroxysmal atrial fibrillation, thoracic aortic aneurysm, hypertension, hyperlipidemia, mild cognitive impairment, CVA, colon cancer, vocal cord cancer status post tracheostomy presenting to the hospital via EMS for evaluation of an unwitnessed fall.   Assessment & Plan:   Principal Problem:   Closed displaced fracture of left femoral neck (HCC) Active Problems:   Paroxysmal atrial fibrillation (HCC)   Chronic systolic congestive heart failure ([place])   Fall   AKI (acute kidney injury) ([place])  Left femoral neck fracture, sub capital ;  Presume mechanical fall.  Poor historian.  Pain medication available.  Orthopedic consulted.  Cardiology consulted, pre-op evaluation and management of heart failure.  Appreciate cardio evaluation.  Patient underwent sx prosthetic replacement for femoral neck fracture.    Acute metabolic encephalopathy; Suspect multifactorial: hypoglycemia, hypoxemia, medications.  He received last night ativan and robaxin.  I have discontinue sedatives.  He received Amp D 50 for hypoglycemia.  ABG with PO A2 at 70. Started oxygen supplementation.  Check chest x ray, UA.  Came back to check on patient at 1;40, he is alert, following command.   Hypoxemia;  Will check Chest  X ray.  Started oxygen supplementation.  Depending on X ray might need lasix. Cardiology to follow in consultation.   Hypoglycemia;  Amp D 50. Started low rate D 5.  Check cortisol level.   Acute systolic Heart failure exacerbation. EF 20--25 % Chest x ray with pulmonary edema, BNP  elevated.  Cr up, cardiology recommending holding lasix. Patient received oral dose lasix 4-11. Cardiology consulted. Recommending to hold lasix. More hypoxic and lethargic. Check chest x ray. \ Cardiology following.    AKI on CKD stage III Prior cr 1.1 Cr on admission elevated at 1.5, monitor on IV lasix.  Might be related to heart failure.  Improved on lasix initially. Cr up again today. Plan to hold lasix.  Cr improved.   Unwitnessed Fall;  Patient is confuse, he is not able to provide accurate history. Presume mechanical fall.   Paroxysmal A fib;  Holding eliquis.  Does not appears to be on rate controlled medications.  Back on eliquis.    Hypokalemia;  Resolved.    Vocal cord cancer S/P tracheostomy.  tracheostomy in place.   Respiratory therapy proving local care.   Thrombocytopenia; mild monitor. Improved.    Estimated body mass index is 28.43 kg/m as calculated from the following:   Height as of this encounter: 5\' 6"  (1.676 m).   Weight as of this encounter: 79.9 kg.   DVT prophylaxis: SCD Code Status: full code Family Communication: Daughter Updated.  Disposition Plan: remain in the hospital.   Consultants:   Cardiology  Orthopedic.    Procedures:   none   Antimicrobials:   none   Subjective: Patient is lethargic today.  Open eyes fall back sleep.   Objective: Vitals:   11/20/18 1435 11/20/18 2036 11/21/18 0427 11/21/18 0500  BP:  114/67 110/69   Pulse:  80 81   Resp:  18 18   Temp:  97.7 F (36.5 C) 97.9 F (36.6 C)   TempSrc:  Oral Oral  SpO2: 93% 94% 96%   Weight:    79.9 kg  Height:        Intake/Output Summary (Last 24 hours) at 11/21/2018 1100 Last data filed at 11/21/2018 0428 Gross per 24 hour  Intake 0 ml  Output 500 ml  Net -500 ml   Filed Weights   11/18/18 1310 11/18/18 1607 11/21/18 0500  Weight: 77.9 kg 77.9 kg 79.9 kg    Examination:  General exam: Lethargic Respiratory system: Tracheostomy ,  CTA  Cardiovascular system: S 1, S 2  Gastrointestinal system: BS present, soft, nt Central nervous system: lethargic open eyes to voice, fall back to sleep. Moves upper extremities at times.  Extremities: trace edema, Left Hip with dressing.  Skin: No rashes, lesions or ulcers    Data Reviewed: I have personally reviewed following labs and imaging studies  CBC: Recent Labs  Lab 11/17/18 0118 11/18/18 0343 11/19/18 0254 11/20/18 0302 11/21/18 0330  WBC 7.4 8.5 9.9 9.8 8.6  NEUTROABS 5.6  --   --   --   --   HGB 15.4 14.4 13.5 13.8 13.4  HCT 49.0 44.4 40.7 42.5 40.6  MCV 101.0* 97.8 100.2* 98.2 99.5  PLT 150 126* 116* 120* 607*   Basic Metabolic Panel: Recent Labs  Lab 11/17/18 0329 11/18/18 0343 11/19/18 0254 11/20/18 0302 11/21/18 0852  NA 142 143 141 142 143  K 3.3* 3.4* 3.4* 3.4* 3.7  CL 108 111 103 108 111  CO2 19* 22 23 23 24   GLUCOSE 115* 171* 144* 99 85  BUN 29* 21 23 27* 25*  CREATININE 1.58* 1.34* 1.51* 1.43* 1.13  CALCIUM 8.7* 8.6* 8.5* 8.5* 8.5*  MG 2.2  --   --   --   --    GFR: Estimated Creatinine Clearance: 42.3 mL/min (by C-G formula based on SCr of 1.13 mg/dL). Liver Function Tests: No results for input(s): AST, ALT, ALKPHOS, BILITOT, PROT, ALBUMIN in the last 168 hours. No results for input(s): LIPASE, AMYLASE in the last 168 hours. No results for input(s): AMMONIA in the last 168 hours. Coagulation Profile: Recent Labs  Lab 11/17/18 0118  INR 1.4*   Cardiac Enzymes: Recent Labs  Lab 11/17/18 0329  TROPONINI 0.03*   BNP (last 3 results) No results for input(s): PROBNP in the last 8760 hours. HbA1C: No results for input(s): HGBA1C in the last 72 hours. CBG: Recent Labs  Lab 11/21/18 0532 11/21/18 0802 11/21/18 0921 11/21/18 0946 11/21/18 1008  GLUCAP 117* 82 63* 58* 135*   Lipid Profile: No results for input(s): CHOL, HDL, LDLCALC, TRIG, CHOLHDL, LDLDIRECT in the last 72 hours. Thyroid Function Tests: No results for input(s):  TSH, T4TOTAL, FREET4, T3FREE, THYROIDAB in the last 72 hours. Anemia Panel: No results for input(s): VITAMINB12, FOLATE, FERRITIN, TIBC, IRON, RETICCTPCT in the last 72 hours. Sepsis Labs: No results for input(s): PROCALCITON, LATICACIDVEN in the last 168 hours.  Recent Results (from the past 240 hour(s))  Surgical pcr screen     Status: None   Collection Time: 11/17/18  3:09 AM  Result Value Ref Range Status   MRSA, PCR NEGATIVE NEGATIVE Final   Staphylococcus aureus NEGATIVE NEGATIVE Final    Comment: (NOTE) The Xpert SA Assay (FDA approved for NASAL specimens in patients 49 years of age and older), is one component of a comprehensive surveillance program. It is not intended to diagnose infection nor to guide or monitor treatment. Performed at [facility], [place] [address], [place], [place] [address]  Radiology Studies: Dg Abd Portable 1v  Result Date: 11/21/2018 CLINICAL DATA:  Constipation. EXAM: PORTABLE ABDOMEN - 1 VIEW COMPARISON:  CT scan of the abdomen dated 02/27/2016 FINDINGS: The bowel gas pattern is normal. There is no excessive stool in the colon. No fecal impaction. Aortic atherosclerosis with tortuosity of the thoracic aorta. Lumbar scoliosis with deformity of the L1 vertebral body, unchanged. Left proximal femoral prosthesis in place. IMPRESSION: No acute abnormality of the abdomen. Specifically, no evidence of excessive stool or fecal impaction. Aortic Atherosclerosis (ICD10-I70.0). Electronically Signed   By: Lorriane Shire M.D.   On: 11/21/2018 10:39        Scheduled Meds: . apixaban  5 mg Oral BID  . dextrose      . docusate sodium  100 mg Oral BID  . feeding supplement (ENSURE ENLIVE)  237 mL Oral BID BM  . nystatin  5 mL Oral QID  . polyethylene glycol  17 g Oral Daily  . potassium chloride  40 mEq Oral Once   Continuous Infusions: . dextrose       LOS: 4 days    Time spent:35 minutes     Elmarie Shiley, MD [facility] Pager [phone]  If 7PM-7AM, please contact night-coverage [email or web address] Password [email or web address] 11/21/2018, 11:00 AM

## 2018-11-21 NOTE — Progress Notes (Signed)
Pt placed on ATC 5 L 28% per MD request.  Patient has an existing stoma.

## 2018-11-21 NOTE — Progress Notes (Signed)
PT Cancellation Note  Patient Details Name: DAIVD FREDERICKSEN MRN: [id number] DOB: Aug 07, 1925   Cancelled Treatment:    Reason Eval/Treat Not Completed: Medical issues which prohibited therapy.  Per RN, pt is very lethargic today with increased AMS.  X-ray and EKG in room.  RN recommended holding for today.  PT to check back tomorrow.  Thanks,  Barbarann Ehlers. Quenna Doepke, PT, DPT  Acute Rehabilitation [phone] pager [phone]) [phone] office     Wells Guiles B Athene Schuhmacher 11/21/2018, 12:02 PM

## 2018-11-21 NOTE — Progress Notes (Signed)
BS 117
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
## 2018-11-21 NOTE — Progress Notes (Signed)
PT BS 65, responsive but unable to wake up enough to swallow. Administered 1mg  glucagon injection due to IV access infiltrated. Will recheck BS in 15 minutes. MD aware.

## 2018-11-21 NOTE — Care Management Important Message (Signed)
Important Message  Patient Details  Name: KRISTIN LAMAGNA MRN: [id number] Date of Birth: 07-22-1925   Medicare Important Message Given:  Yes    Smitty Ackerley Montine Circle 11/21/2018, 4:15 PM

## 2018-11-21 NOTE — Progress Notes (Addendum)
Progress Note  Patient Name: Timothy Suarez Date of Encounter: 11/21/2018  Primary Cardiologist: Fransico Him, MD   Subjective   The patient is somnolent and doesn't answer questions.   Inpatient Medications    Scheduled Meds: . apixaban  5 mg Oral BID  . dextrose      . docusate sodium  100 mg Oral BID  . feeding supplement (ENSURE ENLIVE)  237 mL Oral BID BM  . nystatin  5 mL Oral QID  . polyethylene glycol  17 g Oral Daily  . potassium chloride  40 mEq Oral Once   Continuous Infusions: . dextrose 35 mL/hr at 11/21/18 1131   PRN Meds: acetaminophen, alum & mag hydroxide-simeth, glucagon (human recombinant), magnesium citrate, menthol-cetylpyridinium **OR** phenol, polyethylene glycol, senna-docusate, sorbitol   Vital Signs    Vitals:   11/20/18 2036 11/21/18 0427 11/21/18 0500 11/21/18 1208  BP: 114/67 110/69    Pulse: 80 81  (!) 59  Resp: 18 18  18   Temp: 97.7 F (36.5 C) 97.9 F (36.6 C)    TempSrc: Oral Oral    SpO2: 94% 96%  98%  Weight:   79.9 kg   Height:        Intake/Output Summary (Last 24 hours) at 11/21/2018 1219 Last data filed at 11/21/2018 0428 Gross per 24 hour  Intake 0 ml  Output 500 ml  Net -500 ml   Last 3 Weights 11/21/2018 11/18/2018 11/18/2018  Weight (lbs) 176 lb 2.4 oz 171 lb 11.8 oz 171 lb 11.8 oz  Weight (kg) 79.9 kg 77.9 kg 77.9 kg      Telemetry    2.AVB, Mobitz I - Personally Reviewed  ECG    No new- Personally Reviewed  Physical Exam  GEN: pt obtunded   Neck: No JVD Cardiac: RRR, no murmurs, rubs, or gallops.  Respiratory: crackles to auscultation bilaterally. GI: Soft, nontender, non-distended  MS: No edema; No deformity. Neuro:  Nonfocal  Psych: Normal affect   Labs    Chemistry Recent Labs  Lab 11/19/18 0254 11/20/18 0302 11/21/18 0852  NA 141 142 143  K 3.4* 3.4* 3.7  CL 103 108 111  CO2 23 23 24   GLUCOSE 144* 99 85  BUN 23 27* 25*  CREATININE 1.51* 1.43* 1.13  CALCIUM 8.5* 8.5* 8.5*   GFRNONAA 40* 43* 57*  GFRAA 46* 49* >60  ANIONGAP 15 11 8      Hematology Recent Labs  Lab 11/19/18 0254 11/20/18 0302 11/21/18 0330  WBC 9.9 9.8 8.6  RBC 4.06* 4.33 4.08*  HGB 13.5 13.8 13.4  HCT 40.7 42.5 40.6  MCV 100.2* 98.2 99.5  MCH 33.3 31.9 32.8  MCHC 33.2 32.5 33.0  RDW 13.9 13.5 13.8  PLT 116* 120* 114*    Cardiac Enzymes Recent Labs  Lab 11/17/18 0329  TROPONINI 0.03*   No results for input(s): TROPIPOC in the last 168 hours.   BNP Recent Labs  Lab 11/17/18 0118  BNP 3,122.2*     DDimer No results for input(s): DDIMER in the last 168 hours.   Radiology    Dg Abd Portable 1v  Result Date: 11/21/2018 CLINICAL DATA:  Constipation. EXAM: PORTABLE ABDOMEN - 1 VIEW COMPARISON:  CT scan of the abdomen dated 02/27/2016 FINDINGS: The bowel gas pattern is normal. There is no excessive stool in the colon. No fecal impaction. Aortic atherosclerosis with tortuosity of the thoracic aorta. Lumbar scoliosis with deformity of the L1 vertebral body, unchanged. Left proximal femoral prosthesis in place.  IMPRESSION: No acute abnormality of the abdomen. Specifically, no evidence of excessive stool or fecal impaction. Aortic Atherosclerosis (ICD10-I70.0). Electronically Signed   By: Lorriane Shire M.D.   On: 11/21/2018 10:39    Cardiac Studies     Patient Profile     85 y.o. male w/ h/o laryngeal cancer s/ptracheostomyandusesa passy muir speakingvalveto communicate, as well as h/o colon cancer, MVP w/ severe MR s/p minimally invasive mitral valve repair, nonobstructive CAD by cath at time of valve repair (50-70% LAD lesion), chronic systolic HF/DCM w/ EF of 03-54%, PAF s/p Maze procedure, chronic anticoagulation w/ Eliquis, thoracic aortic aneurysm,second-degree AV block type I and II, RBBB, HTN, HLD, h/o CVA and mild cognitive impairment, admitted after a unwitnessed, presumed, mechanical fall resulting in left femoral neck fracture. He isbeing seen today  forpreoperative evaluation/cardiac risk assessment for hip arthoplasty,at the request ofDr. Regalado, Internal Medicine and Dr. Erlinda Hong, Orthopedics.  Assessment & Plan    1. Closed displaced fracture of left femoral neck (HCC) 2.  Paroxysmal atrial fibrillation ([place]) 3. Acute on Chronic systolic congestive heart failure ([place]) 4.  AKI (acute kidney injury) ([place])  Crea improved, the patient is fluid overloaded on physical exam, BNP up, CXR with pulmonary edema, I would restart lasix 40 mg iv BID x 3 doses, follow crea, K closely.   For questions or updates, please contact [place] Please consult [email or web address] for contact info under        Signed, Ena Dawley, MD  11/21/2018, 12:19 PM

## 2018-11-22 LAB — CBC
HCT: 42.5 % (ref 39.0–52.0)
HCT: 43.8 % (ref 39.0–52.0)
Hemoglobin: 14.1 g/dL (ref 13.0–17.0)
Hemoglobin: 14.4 g/dL (ref 13.0–17.0)
MCH: 32.7 pg (ref 26.0–34.0)
MCH: 32.7 pg (ref 26.0–34.0)
MCHC: 33.2 g/dL (ref 30.0–36.0)
MCHC: 32.9 g/dL (ref 30.0–36.0)
MCV: 98.6 fL (ref 80.0–100.0)
MCV: 99.3 fL (ref 80.0–100.0)
Platelets: 117 10*3/uL — ABNORMAL LOW (ref 150–400)
Platelets: 117 10*3/uL — ABNORMAL LOW (ref 150–400)
RBC: 4.31 MIL/uL (ref 4.22–5.81)
RBC: 4.41 MIL/uL (ref 4.22–5.81)
RDW: 13.7 % (ref 11.5–15.5)
RDW: 13.8 % (ref 11.5–15.5)
WBC: 8.8 10*3/uL (ref 4.0–10.5)
WBC: 8.2 10*3/uL (ref 4.0–10.5)
nRBC: 0 % (ref 0.0–0.2)
nRBC: 0 % (ref 0.0–0.2)

## 2018-11-22 LAB — GLUCOSE, CAPILLARY
Glucose-Capillary: 102 mg/dL — ABNORMAL HIGH (ref 70–99)
Glucose-Capillary: 104 mg/dL — ABNORMAL HIGH (ref 70–99)
Glucose-Capillary: 109 mg/dL — ABNORMAL HIGH (ref 70–99)
Glucose-Capillary: 110 mg/dL — ABNORMAL HIGH (ref 70–99)
Glucose-Capillary: 111 mg/dL — ABNORMAL HIGH (ref 70–99)
Glucose-Capillary: 75 mg/dL (ref 70–99)
Glucose-Capillary: 77 mg/dL (ref 70–99)
Glucose-Capillary: 79 mg/dL (ref 70–99)
Glucose-Capillary: 88 mg/dL (ref 70–99)
Glucose-Capillary: 88 mg/dL (ref 70–99)
Glucose-Capillary: 92 mg/dL (ref 70–99)

## 2018-11-22 LAB — BASIC METABOLIC PANEL
Anion gap: 11 (ref 5–15)
BUN: 23 mg/dL (ref 8–23)
CO2: 26 mmol/L (ref 22–32)
Calcium: 8.4 mg/dL — ABNORMAL LOW (ref 8.9–10.3)
Chloride: 106 mmol/L (ref 98–111)
Creatinine, Ser: 1.16 mg/dL (ref 0.61–1.24)
GFR calc Af Amer: >60 mL/min (ref >60)
GFR calc non Af Amer: 55 mL/min — ABNORMAL LOW (ref >60)
Glucose, Bld: 92 mg/dL (ref 70–99)
Potassium: 3.7 mmol/L (ref 3.5–5.1)
Sodium: 143 mmol/L (ref 135–145)

## 2018-11-22 MED ORDER — POTASSIUM CHLORIDE CRYS ER 20 MEQ PO TBCR
20.0000 meq | EXTENDED_RELEASE_TABLET | Freq: Once | ORAL | Status: AC
  Administered 2018-11-22: 15:00:00 20 meq via ORAL
  Filled 2018-11-22: qty 1

## 2018-11-22 MED ORDER — FUROSEMIDE 20 MG PO TABS
20.0000 mg | ORAL_TABLET | Freq: Every day | ORAL | Status: DC
  Administered 2018-11-22 – 2018-11-23 (×2): 20 mg via ORAL
  Filled 2018-11-22 (×2): qty 1

## 2018-11-22 NOTE — Progress Notes (Signed)
Upon hourly rounding , pt had removed TC and taken apart tubing. Reattached tubing and maintained current settings 28%/5L. Call central monitoring to check pt with PVC's, noted prior hx and continued afib at 85.

## 2018-11-22 NOTE — Progress Notes (Signed)
Progress Note  Patient Name: BLAS RICHES Date of Encounter: 11/22/2018  Primary Cardiologist: Fransico Him, MD   Subjective   The patient is much better  Today, out of bed with help, walking to bathroom.  Inpatient Medications    Scheduled Meds:  apixaban  5 mg Oral BID   docusate sodium  100 mg Oral BID   feeding supplement (ENSURE ENLIVE)  237 mL Oral BID BM   latanoprost  1 drop Both Eyes QHS   nystatin  5 mL Oral QID   polyethylene glycol  17 g Oral Daily   potassium chloride  40 mEq Oral Once   Continuous Infusions:  dextrose 20 mL/hr at 11/21/18 1544   PRN Meds: acetaminophen, alum & mag hydroxide-simeth, glucagon (human recombinant), magnesium citrate, menthol-cetylpyridinium **OR** phenol, polyethylene glycol, senna-docusate, sorbitol   Vital Signs    Vitals:   11/22/18 0222 11/22/18 0404 11/22/18 0809 11/22/18 0942  BP: 106/71 121/72  (!) 97/54  Pulse: (!) 116 70 68 (!) 102  Resp: 18 19 18 18   Temp: 98.4 F (36.9 C) 98.2 F (36.8 C)    TempSrc: Oral Oral    SpO2: 99% 99% 98% (!) 74%  Weight:      Height:        Intake/Output Summary (Last 24 hours) at 11/22/2018 0944 Last data filed at 11/22/2018 0500 Gross per 24 hour  Intake 266.93 ml  Output 1300 ml  Net -1033.07 ml   Last 3 Weights 11/21/2018 11/18/2018 11/18/2018  Weight (lbs) 176 lb 2.4 oz 171 lb 11.8 oz 171 lb 11.8 oz  Weight (kg) 79.9 kg 77.9 kg 77.9 kg      Telemetry    2.AVB, Mobitz I - Personally Reviewed  ECG    No new- Personally Reviewed  Physical Exam  GEN: pt obtunded   Neck: No JVD Cardiac: RRR, no murmurs, rubs, or gallops.  Respiratory: dry rales to auscultation bilaterally. GI: Soft, nontender, non-distended  MS: No edema; No deformity. Neuro:  Nonfocal  Psych: Normal affect   Labs    Chemistry Recent Labs  Lab 11/20/18 0302 11/21/18 0852 11/22/18 0822  NA 142 143 143  K 3.4* 3.7 3.7  CL 108 111 106  CO2 23 24 26   GLUCOSE 99 85 92  BUN 27*  25* 23  CREATININE 1.43* 1.13 1.16  CALCIUM 8.5* 8.5* 8.4*  GFRNONAA 43* 57* 55*  GFRAA 49* >60 >60  ANIONGAP 11 8 11      Hematology Recent Labs  Lab 11/21/18 0330 11/22/18 0256 11/22/18 0822  WBC 8.6 8.8 8.2  RBC 4.08* 4.31 4.41  HGB 13.4 14.1 14.4  HCT 40.6 42.5 43.8  MCV 99.5 98.6 99.3  MCH 32.8 32.7 32.7  MCHC 33.0 33.2 32.9  RDW 13.8 13.7 13.8  PLT 114* 117* 117*    Cardiac Enzymes Recent Labs  Lab 11/17/18 0329 11/21/18 1208  TROPONINI 0.03* 0.03*   No results for input(s): TROPIPOC in the last 168 hours.   BNP Recent Labs  Lab 11/17/18 0118  BNP 3,122.2*    DDimer No results for input(s): DDIMER in the last 168 hours.   Radiology    Dg Chest Port 1 View  Result Date: 11/21/2018 CLINICAL DATA:  Low oxygen saturation.  Cyanotic.  Lethargy. EXAM: PORTABLE CHEST 1 VIEW COMPARISON:  11/16/2018; 08/10/2017 FINDINGS: Grossly unchanged enlarged cardiac silhouette and mediastinal contours with atherosclerotic plaque within thoracic aorta. Post valve replacement. Pulmonary vasculature remains indistinct with cephalization of flow. Unchanged trace  bilateral effusions with associated bibasilar opacities, left greater than right. No new focal airspace opacities. No pneumothorax. No acute osseus abnormalities. IMPRESSION: Similar findings of cardiomegaly, mild pulmonary edema, trace pleural effusions and associated bibasilar opacities, left greater than right, similar to the 08/2017 examination Electronically Signed   By: Sandi Mariscal M.D.   On: 11/21/2018 13:06   Dg Abd Portable 1v  Result Date: 11/21/2018 CLINICAL DATA:  Constipation. EXAM: PORTABLE ABDOMEN - 1 VIEW COMPARISON:  CT scan of the abdomen dated 02/27/2016 FINDINGS: The bowel gas pattern is normal. There is no excessive stool in the colon. No fecal impaction. Aortic atherosclerosis with tortuosity of the thoracic aorta. Lumbar scoliosis with deformity of the L1 vertebral body, unchanged. Left proximal femoral  prosthesis in place. IMPRESSION: No acute abnormality of the abdomen. Specifically, no evidence of excessive stool or fecal impaction. Aortic Atherosclerosis (ICD10-I70.0). Electronically Signed   By: Lorriane Shire M.D.   On: 11/21/2018 10:39   Cardiac Studies     Patient Profile     85 y.o. male w/ h/o laryngeal cancer s/ptracheostomyandusesa passy muir speakingvalveto communicate, as well as h/o colon cancer, MVP w/ severe MR s/p minimally invasive mitral valve repair, nonobstructive CAD by cath at time of valve repair (50-70% LAD lesion), chronic systolic HF/DCM w/ EF of 62-69%, PAF s/p Maze procedure, chronic anticoagulation w/ Eliquis, thoracic aortic aneurysm,second-degree AV block type I and II, RBBB, HTN, HLD, h/o CVA and mild cognitive impairment, admitted after a unwitnessed, presumed, mechanical fall resulting in left femoral neck fracture. He isbeing seen today forpreoperative evaluation/cardiac risk assessment for hip arthoplasty,at the request ofDr. Regalado, Internal Medicine and Dr. Erlinda Hong, Orthopedics.  Assessment & Plan    1. Closed displaced fracture of left femoral neck (HCC) 2.  Paroxysmal atrial fibrillation ([place]) 3. Acute on Chronic systolic congestive heart failure ([place]) 4.  AKI (acute kidney injury) ([place])  Volume status improved, negative 2 L overnight, Crea is stable, I would start lasix 20 mg po daily.  [facility] will sign off.   Medication Recommendations:  Lasix 20 mg po daily Other recommendations (labs, testing, etc):  No further tests needed Follow up as an outpatient:  As needed   For questions or updates, please contact [place] Please consult [email or web address] for contact info under    Signed, Ena Dawley, MD  11/22/2018, 9:44 AM

## 2018-11-22 NOTE — Progress Notes (Signed)
Occupational Therapy Treatment Patient Details Name: Timothy Suarez MRN: [id number] DOB: Sep 06, 1924 Today's Date: 11/22/2018    History of present illness Pt is a 85 y.o. male admitted on 11/16/18 after falling at [place] and sustaining a L femoral neck fx. Now s/p  L direct anterior partial hip arthroplasty on 11/18/18 (direct anterior approach; WBAT). PMH includes thrombocytopenia, wandering aortic pacemaker, R vocal cord CA with trach, RBB, PVCs, PAF, MVP, HTN, glaucoma, CAD, colon CA, CKD, cerebral ischemia, R TKA, MVR, laryngectomy, colectomy.     OT comments  Pt progressed to [facility] and then to chair this session. Pt noted to have a leaking condom cath with bed saturated on arrival. Bed linen changed during session and pt upright in chair reading newspaper at the end of session. Pt does have an ipad present and family would appreciate (A) to call them during acute stay. Suzi Roots is the local daughter helping to coordinate care.   Follow Up Recommendations  SNF;Supervision/Assistance - 24 hour    Equipment Recommendations  Other (comment)    Recommendations for Other Services      Precautions / Restrictions Precautions Precautions: Fall Precaution Comments: Vent/trach, electrolarynx Restrictions Weight Bearing Restrictions: Yes LLE Weight Bearing: Weight bearing as tolerated       Mobility Bed Mobility Overal bed mobility: Needs Assistance Bed Mobility: Supine to Sit     Supine to sit: Mod assist Sit to supine: Mod assist;HOB elevated   General bed mobility comments: ModA to assist trunk elevation; initiating BLE movement to EOB well  Transfers Overall transfer level: Needs assistance Equipment used: Rolling walker (2 wheeled) Transfers: Sit to/from Stand Sit to Stand: +2 safety/equipment;From elevated surface;Mod assist         General transfer comment: ModA to assist trunk elevation standing from EOB and from [facility]; cues for hand placement    Balance  Overall balance assessment: Needs assistance Sitting-balance support: No upper extremity supported Sitting balance-Leahy Scale: Fair Sitting balance - Comments: supervision EOB.  We sat EOB for >30 mins as pt explained to me his theory on why he is here.     Standing balance support: Bilateral upper extremity supported Standing balance-Leahy Scale: Poor Standing balance comment: Reliant on UE support                           ADL either performed or assessed with clinical judgement   ADL Overall ADL's : Needs assistance/impaired Eating/Feeding: Set up;Sitting   Grooming: Set up;Sitting                   Toilet Transfer: Min guard;BSC;RW   Toileting- Clothing Manipulation and Hygiene: Maximal assistance;Sit to/from stand       Functional mobility during ADLs: Minimal assistance;Rolling walker General ADL Comments: pt completed oob to BSc and then to chair. pt appropriate and making requests appropriately. pt does make request to children to leave to go home. family reports patient does have some confusion on the COVID situation and that he should not have IPAD at night because he calls at [place]: Awake/alert Behavior During Therapy: WFL for tasks assessed/performed Overall Cognitive Status: Impaired/Different from baseline Area of Impairment: Attention;Memory;Following commands;Safety/judgement;Problem solving;Awareness                 Orientation Level: Disoriented to;Time;Situation;Place Current Attention Level:  Sustained Memory: Decreased short-term memory;Decreased recall of precautions Following Commands: Follows one step commands with increased time;Follows multi-step commands inconsistently Safety/Judgement: Decreased awareness of safety;Decreased awareness of deficits Awareness: Intellectual Problem Solving: Requires verbal cues;Requires tactile cues General Comments:  Did not ask orientation questons; able to state name/nickname. Answering questions and following majority of simple commands appropriately; required increased time and increased cues. pt perseverating on "when can i go home" pt asking daughters multiple times to go home        Exercises     Shoulder Instructions       General Comments facetimed daughter Dolores Frame and daughter Lattie Haw. pt able to communicate with them this session. family very excited to see him.     Pertinent Vitals/ Pain       Pain Assessment: Faces Faces Pain Scale: Hurts a little bit Pain Location: left hip Pain Descriptors / Indicators: Guarding Pain Intervention(s): Monitored during session;Premedicated before session;Repositioned  Home Living                                          Prior Functioning/Environment              Frequency  Min 3X/week        Progress Toward Goals  OT Goals(current goals can now be found in the care plan section)  Progress towards OT goals: Progressing toward goals  Acute Rehab OT Goals Patient Stated Goal: Family would like him to go to SNF for rehab OT Goal Formulation: With patient Time For Goal Achievement: 12/04/18 Potential to Achieve Goals: Good ADL Goals Pt Will Perform Grooming: with modified independence;sitting Pt Will Transfer to Toilet: with min assist;stand pivot transfer;ambulating Additional ADL Goal #1: Pt will perform  x5 mins dynamic standing fair balance task for ADL.  Plan Discharge plan remains appropriate    Co-evaluation      Reason for Co-Treatment: Necessary to address cognition/behavior during functional activity;For patient/therapist safety;To address functional/ADL transfers(agitation overnight)          AM-PAC OT "6 Clicks" Daily Activity     Outcome Measure   Help from another person eating meals?: None Help from another person taking care of personal grooming?: A Little Help from another person toileting,  which includes using toliet, bedpan, or urinal?: A Lot Help from another person bathing (including washing, rinsing, drying)?: A Lot Help from another person to put on and taking off regular upper body clothing?: A Little Help from another person to put on and taking off regular lower body clothing?: A Lot 6 Click Score: 16    End of Session Equipment Utilized During Treatment: Gait belt;Rolling walker  OT Visit Diagnosis: Unsteadiness on feet (R26.81);Other abnormalities of gait and mobility (R26.89);Muscle weakness (generalized) (M62.81)   Activity Tolerance Patient tolerated treatment well   Patient Left in chair;with call bell/phone within reach;with chair alarm set   Nurse Communication Mobility status        Time: 0321-2248 OT Time Calculation (min): 49 min  Charges: OT General Charges $OT Visit: 1 Visit OT Treatments $Self Care/Home Management : 23-37 mins   Jeri Modena, OTR/L  Acute Rehabilitation Services Pager: [phone] Office: [phone] .    Jeri Modena 11/22/2018, 1:44 PM

## 2018-11-22 NOTE — Progress Notes (Signed)
Physical Therapy Treatment Patient Details Name: Timothy Suarez MRN: [id number] DOB: 1925-03-17 Today's Date: 11/22/2018    History of Present Illness Pt is a 85 y.o. male admitted on 11/16/18 after falling at [place] and sustaining a L femoral neck fx. Now s/p  L direct anterior partial hip arthroplasty on 11/18/18 (direct anterior approach; WBAT). PMH includes thrombocytopenia, wandering aortic pacemaker, R vocal cord CA with trach, RBB, PVCs, PAF, MVP, HTN, glaucoma, CAD, colon CA, CKD, cerebral ischemia, R TKA, MVR, laryngectomy, colectomy.     PT Comments    Pt progressing well with mobility. Able to stand and initiate gait training with RW and min-modA for mobility (assist +2 for safety with lines). Pt with increased ability to WBAT through LLE. Currently requiring assist for ADLs. Recommendation for SNF-level therapies remains appropriate. Will continue to follow acutely.   Follow Up Recommendations  SNF;Supervision/Assistance - 24 hour     Equipment Recommendations  Other (comment)(TBD)    Recommendations for Other Services Rehab consult     Precautions / Restrictions Precautions Precautions: Fall Precaution Comments: Vent/trach, electrolarynx Restrictions Weight Bearing Restrictions: Yes LLE Weight Bearing: Weight bearing as tolerated    Mobility  Bed Mobility Overal bed mobility: Needs Assistance Bed Mobility: Supine to Sit     Supine to sit: Mod assist     General bed mobility comments: ModA to assist trunk elevation; initiating BLE movement to EOB well  Transfers Overall transfer level: Needs assistance Equipment used: Rolling walker (2 wheeled) Transfers: Sit to/from Stand Sit to Stand: +2 safety/equipment;From elevated surface;Mod assist         General transfer comment: ModA to assist trunk elevation standing from EOB and from [facility]; cues for hand placement  Ambulation/Gait Ambulation/Gait assistance: Min assist;Min guard Gait Distance  (Feet): 8 Feet Assistive device: Rolling walker (2 wheeled) Gait Pattern/deviations: Step-to pattern;Antalgic;Trunk flexed Gait velocity: Decreased Gait velocity interpretation: <1.31 ft/sec, indicative of household ambulator General Gait Details: Pt took steps from bed to [facility] to recliner with RW and intermittent minA for balance; slow, antalgic steps with intermittent cues for sequencing. Increased ability to WBAT through LLE   Stairs             Wheelchair Mobility    Modified Rankin (Stroke Patients Only)       Balance Overall balance assessment: Needs assistance Sitting-balance support: No upper extremity supported Sitting balance-Leahy Scale: Fair     Standing balance support: Bilateral upper extremity supported   Standing balance comment: Reliant on UE support                            Cognition Arousal/Alertness: Awake/alert Behavior During Therapy: WFL for tasks assessed/performed Overall Cognitive Status: Impaired/Different from baseline Area of Impairment: Attention;Memory;Following commands;Safety/judgement;Problem solving;Awareness                   Current Attention Level: Sustained Memory: Decreased short-term memory;Decreased recall of precautions Following Commands: Follows one step commands with increased time;Follows multi-step commands inconsistently Safety/Judgement: Decreased awareness of safety;Decreased awareness of deficits   Problem Solving: Requires verbal cues;Requires tactile cues General Comments: Did not ask orientation questons; able to state name/nickname. Answering questions and following majority of simple commands appropriately; required increased time and increased cues.       Exercises      General Comments General comments (skin integrity, edema, etc.): OT able to assist patient in Facetime with family. Pt found to have soft knee  brace in belongings; per family, wears on L knee when walking for comfort        Pertinent Vitals/Pain Pain Assessment: Faces Faces Pain Scale: Hurts a little bit Pain Location: left hip Pain Descriptors / Indicators: Guarding Pain Intervention(s): Monitored during session;Limited activity within patient's tolerance    Home Living                      Prior Function            PT Goals (current goals can now be found in the care plan section) Acute Rehab PT Goals Patient Stated Goal: Family would like him to go to SNF for rehab PT Goal Formulation: With family Time For Goal Achievement: 12/03/18 Potential to Achieve Goals: Good Progress towards PT goals: Progressing toward goals    Frequency           PT Plan Current plan remains appropriate    Co-evaluation PT/OT/SLP Co-Evaluation/Treatment: Yes Reason for Co-Treatment: Necessary to address cognition/behavior during functional activity;For patient/therapist safety;To address functional/ADL transfers(agitation overnight)          AM-PAC PT "6 Clicks" Mobility   Outcome Measure  Help needed turning from your back to your side while in a flat bed without using bedrails?: A Little Help needed moving from lying on your back to sitting on the side of a flat bed without using bedrails?: A Lot Help needed moving to and from a bed to a chair (including a wheelchair)?: A Lot Help needed standing up from a chair using your arms (e.g., wheelchair or bedside chair)?: A Little Help needed to walk in hospital room?: A Little Help needed climbing 3-5 steps with a railing? : Total 6 Click Score: 14    End of Session Equipment Utilized During Treatment: Gait belt Activity Tolerance: Patient tolerated treatment well Patient left: in chair;with call bell/phone within reach;with chair alarm set Nurse Communication: Mobility status PT Visit Diagnosis: Muscle weakness (generalized) (M62.81);Difficulty in walking, not elsewhere classified (R26.2);Pain Pain - Right/Left: Left Pain - part of body:  Hip     Time: 6433-2951 PT Time Calculation (min) (ACUTE ONLY): 32 min  Charges:  $Gait Training: 8-22 mins                    Mabeline Caras, PT, DPT Acute Rehabilitation Services  Pager [phone] Office [place] 11/22/2018, 10:19 AM

## 2018-11-22 NOTE — Plan of Care (Signed)
  Problem: Coping: Goal: Level of anxiety will decrease 11/22/2018 0214 by Josepha Pigg, RN Outcome: Progressing 11/22/2018 0214 by Josepha Pigg, RN Outcome: Progressing   Problem: Nutrition: Goal: Adequate nutrition will be maintained 11/22/2018 0214 by Josepha Pigg, RN Outcome: Progressing 11/22/2018 0214 by Josepha Pigg, RN Outcome: Progressing

## 2018-11-22 NOTE — Progress Notes (Signed)
PROGRESS NOTE    Timothy Suarez  ZOX:[id number] DOB: March 31, 1925 DOA: 11/16/2018 PCP: Gayland Curry, DO   Brief Narrative: Timothy Suarez is a 85 y.o. male with medical history significant of CKD stage III, nonobstructive CAD, chronic systolic CHF with dilated cardiomyopathy (EF 20 to 25%), second-degree AV block type I and II, mitral valve prolapse with severe mitral regurgitation status post repair, paroxysmal atrial fibrillation, thoracic aortic aneurysm, hypertension, hyperlipidemia, mild cognitive impairment, CVA, colon cancer, vocal cord cancer status post tracheostomy presenting to the hospital via EMS for evaluation of an unwitnessed fall.  Patient admitted with left femoral neck fracture, underwent prostatic replacement for femoral neck fracture on 11-18-2018.  Patient.  Course complicated by acute CHF exacerbation and acute metabolic encephalopathy, hypoglycemia, In regards heart failure exacerbation he has improved today.  Cardiology is recommending to resume 20 mg of oral Lasix.  Patient is more alert today, metabolic encephalopathy likely related to medication and hypoxemia.  He received Ativan and baclofen. Hypoglycemia related to poor oral intake.  He is eating a little bit more today.  Assessment & Plan:   Principal Problem:   Closed displaced fracture of left femoral neck (HCC) Active Problems:   Paroxysmal atrial fibrillation (HCC)   Chronic systolic congestive heart failure (HCC)   Acute on chronic combined systolic and diastolic CHF (congestive heart failure) ([place])   Fall   AKI (acute kidney injury) ([place])  Left femoral neck fracture, sub capital ;  Presume mechanical fall.  Poor historian.  Pain medication available.  Orthopedic consulted.  Cardiology consulted, pre-op evaluation and management of heart failure.  Appreciate cardio evaluation.  Patient underwent sx prosthetic replacement for femoral neck fracture.    Acute metabolic encephalopathy; Suspect  multifactorial: hypoglycemia, hypoxemia, medications.  He received ativan and robaxin night of 4-12. I have discontinue sedatives.  He received Amp D 50 for hypoglycemia.  ABG with PO A2 at 70. Started oxygen supplementation.  Improved today.   Acute Hypoxemia; related to pulmonary edema.  Improved.  Started oxygen supplementation.  Received IV lasix 4-13.  Hypoglycemia;  Amp D 50. Received low rate D 5. IV fluid discontinue Cortisol level normal.   Acute systolic Heart failure exacerbation. EF 20--25 % Chest x ray with pulmonary edema, BNP elevated.  Cr up, cardiology recommending holding lasix. Patient received oral dose lasix 4-11. Cardiology consulted. Recommending to hold lasix. More hypoxic and lethargic. Chest x ray pulmonary edema. Received IV lasix 40 BID 4-13. Cardiology following, recommend start 20 mg oral lasix.    AKI on CKD stage III Prior cr 1.1 Cr on admission elevated at 1.5, monitor on IV lasix.  Might be related to heart failure.  Improved on lasix initially. Cr up again today. Plan to hold lasix.  Cr improved.   Unwitnessed Fall;  Patient is confuse, he is not able to provide accurate history. Presume mechanical fall.   Paroxysmal A fib;  Holding eliquis.  Does not appears to be on rate controlled medications.  Back on eliquis.    Hypokalemia;  Resolved.    Vocal cord cancer S/P tracheostomy.  tracheostomy in place.   Respiratory therapy proving local care.   Thrombocytopenia; mild monitor. Improved.    Estimated body mass index is 28.43 kg/m as calculated from the following:   Height as of this encounter: 5\' 6"  (1.676 m).   Weight as of this encounter: 79.9 kg.   DVT prophylaxis: SCD Code Status: full code Family Communication: Daughter Updated.  Disposition Plan: remain  in the hospital.   Consultants:   Cardiology  Orthopedic.    Procedures:   none   Antimicrobials:   none   Subjective: He is more alert, he try to  use valve to speak. He wants to go home  Objective: Vitals:   11/22/18 0222 11/22/18 0404 11/22/18 0809 11/22/18 0942  BP: 106/71 121/72  (!) 97/54  Pulse: (!) 116 70 68 (!) 102  Resp: 18 19 18 18   Temp: 98.4 F (36.9 C) 98.2 F (36.8 C)  98.1 F (36.7 C)  TempSrc: Oral Oral    SpO2: 99% 99% 98% (!) 74%  Weight:      Height:        Intake/Output Summary (Last 24 hours) at 11/22/2018 1309 Last data filed at 11/22/2018 1012 Gross per 24 hour  Intake 266.93 ml  Output 1750 ml  Net -1483.07 ml   Filed Weights   11/18/18 1310 11/18/18 1607 11/21/18 0500  Weight: 77.9 kg 77.9 kg 79.9 kg    Examination:  General exam: Alert, follow command Respiratory system: Tracheostomy ,  CTA Cardiovascular system: S 1, S 2 Gastrointestinal system: BS present, soft, nt Central nervous system: alert, follows command Extremities: trace edema, Left Hip with clean dressing.     Data Reviewed: I have personally reviewed following labs and imaging studies  CBC: Recent Labs  Lab 11/17/18 0118  11/19/18 0254 11/20/18 0302 11/21/18 0330 11/22/18 0256 11/22/18 0822  WBC 7.4   < > 9.9 9.8 8.6 8.8 8.2  NEUTROABS 5.6  --   --   --   --   --   --   HGB 15.4   < > 13.5 13.8 13.4 14.1 14.4  HCT 49.0   < > 40.7 42.5 40.6 42.5 43.8  MCV 101.0*   < > 100.2* 98.2 99.5 98.6 99.3  PLT 150   < > 116* 120* 114* 117* 117*   < > = values in this interval not displayed.   Basic Metabolic Panel: Recent Labs  Lab 11/17/18 0329 11/18/18 0343 11/19/18 0254 11/20/18 0302 11/21/18 0852 11/21/18 1208 11/22/18 0822  NA 142 143 141 142 143  --  143  K 3.3* 3.4* 3.4* 3.4* 3.7  --  3.7  CL 108 111 103 108 111  --  106  CO2 19* 22 23 23 24   --  26  GLUCOSE 115* 171* 144* 99 85  --  92  BUN 29* 21 23 27* 25*  --  23  CREATININE 1.58* 1.34* 1.51* 1.43* 1.13  --  1.16  CALCIUM 8.7* 8.6* 8.5* 8.5* 8.5*  --  8.4*  MG 2.2  --   --   --   --  2.4  --    GFR: Estimated Creatinine Clearance: 41.2 mL/min  (by C-G formula based on SCr of 1.16 mg/dL). Liver Function Tests: No results for input(s): AST, ALT, ALKPHOS, BILITOT, PROT, ALBUMIN in the last 168 hours. No results for input(s): LIPASE, AMYLASE in the last 168 hours. No results for input(s): AMMONIA in the last 168 hours. Coagulation Profile: Recent Labs  Lab 11/17/18 0118  INR 1.4*   Cardiac Enzymes: Recent Labs  Lab 11/17/18 0329 11/21/18 1208  TROPONINI 0.03* 0.03*   BNP (last 3 results) No results for input(s): PROBNP in the last 8760 hours. HbA1C: No results for input(s): HGBA1C in the last 72 hours. CBG: Recent Labs  Lab 11/22/18 0403 11/22/18 0640 11/22/18 0759 11/22/18 0940 11/22/18 1127  GLUCAP 88 88 79  77 102*   Lipid Profile: No results for input(s): CHOL, HDL, LDLCALC, TRIG, CHOLHDL, LDLDIRECT in the last 72 hours. Thyroid Function Tests: No results for input(s): TSH, T4TOTAL, FREET4, T3FREE, THYROIDAB in the last 72 hours. Anemia Panel: No results for input(s): VITAMINB12, FOLATE, FERRITIN, TIBC, IRON, RETICCTPCT in the last 72 hours. Sepsis Labs: No results for input(s): PROCALCITON, LATICACIDVEN in the last 168 hours.  Recent Results (from the past 240 hour(s))  Surgical pcr screen     Status: None   Collection Time: 11/17/18  3:09 AM  Result Value Ref Range Status   MRSA, PCR NEGATIVE NEGATIVE Final   Staphylococcus aureus NEGATIVE NEGATIVE Final    Comment: (NOTE) The Xpert SA Assay (FDA approved for NASAL specimens in patients 4 years of age and older), is one component of a comprehensive surveillance program. It is not intended to diagnose infection nor to guide or monitor treatment. Performed at [facility], [place] [address], [place], [place] [address]          Radiology Studies: Dg Chest Port 1 View  Result Date: 11/21/2018 CLINICAL DATA:  Low oxygen saturation.  Cyanotic.  Lethargy. EXAM: PORTABLE CHEST 1 VIEW COMPARISON:  11/16/2018; 08/10/2017 FINDINGS: Grossly  unchanged enlarged cardiac silhouette and mediastinal contours with atherosclerotic plaque within thoracic aorta. Post valve replacement. Pulmonary vasculature remains indistinct with cephalization of flow. Unchanged trace bilateral effusions with associated bibasilar opacities, left greater than right. No new focal airspace opacities. No pneumothorax. No acute osseus abnormalities. IMPRESSION: Similar findings of cardiomegaly, mild pulmonary edema, trace pleural effusions and associated bibasilar opacities, left greater than right, similar to the 08/2017 examination Electronically Signed   By: Sandi Mariscal M.D.   On: 11/21/2018 13:06   Dg Abd Portable 1v  Result Date: 11/21/2018 CLINICAL DATA:  Constipation. EXAM: PORTABLE ABDOMEN - 1 VIEW COMPARISON:  CT scan of the abdomen dated 02/27/2016 FINDINGS: The bowel gas pattern is normal. There is no excessive stool in the colon. No fecal impaction. Aortic atherosclerosis with tortuosity of the thoracic aorta. Lumbar scoliosis with deformity of the L1 vertebral body, unchanged. Left proximal femoral prosthesis in place. IMPRESSION: No acute abnormality of the abdomen. Specifically, no evidence of excessive stool or fecal impaction. Aortic Atherosclerosis (ICD10-I70.0). Electronically Signed   By: Lorriane Shire M.D.   On: 11/21/2018 10:39        Scheduled Meds:  apixaban  5 mg Oral BID   docusate sodium  100 mg Oral BID   feeding supplement (ENSURE ENLIVE)  237 mL Oral BID BM   furosemide  20 mg Oral Daily   latanoprost  1 drop Both Eyes QHS   nystatin  5 mL Oral QID   polyethylene glycol  17 g Oral Daily   potassium chloride  40 mEq Oral Once   Continuous Infusions:    LOS: 5 days    Time spent:35 minutes     Elmarie Shiley, MD [facility] Pager [phone]  If 7PM-7AM, please contact night-coverage [email or web address] Password [facility] 11/22/2018, 1:09 PM

## 2018-11-23 DIAGNOSIS — R2681 Unsteadiness on feet: Secondary | ICD-10-CM | POA: Diagnosis not present

## 2018-11-23 DIAGNOSIS — D6949 Other primary thrombocytopenia: Secondary | ICD-10-CM | POA: Diagnosis not present

## 2018-11-23 DIAGNOSIS — W19XXXA Unspecified fall, initial encounter: Secondary | ICD-10-CM | POA: Diagnosis not present

## 2018-11-23 DIAGNOSIS — Z7401 Bed confinement status: Secondary | ICD-10-CM | POA: Diagnosis not present

## 2018-11-23 DIAGNOSIS — R296 Repeated falls: Secondary | ICD-10-CM | POA: Diagnosis not present

## 2018-11-23 DIAGNOSIS — I712 Thoracic aortic aneurysm, without rupture: Secondary | ICD-10-CM | POA: Diagnosis not present

## 2018-11-23 DIAGNOSIS — I130 Hypertensive heart and chronic kidney disease with heart failure and stage 1 through stage 4 chronic kidney disease, or unspecified chronic kidney disease: Secondary | ICD-10-CM | POA: Diagnosis not present

## 2018-11-23 DIAGNOSIS — M6281 Muscle weakness (generalized): Secondary | ICD-10-CM | POA: Diagnosis not present

## 2018-11-23 DIAGNOSIS — I440 Atrioventricular block, first degree: Secondary | ICD-10-CM | POA: Diagnosis not present

## 2018-11-23 DIAGNOSIS — Z66 Do not resuscitate: Secondary | ICD-10-CM | POA: Diagnosis not present

## 2018-11-23 DIAGNOSIS — J9621 Acute and chronic respiratory failure with hypoxia: Secondary | ICD-10-CM | POA: Diagnosis not present

## 2018-11-23 DIAGNOSIS — J181 Lobar pneumonia, unspecified organism: Secondary | ICD-10-CM | POA: Diagnosis not present

## 2018-11-23 DIAGNOSIS — R042 Hemoptysis: Secondary | ICD-10-CM | POA: Diagnosis not present

## 2018-11-23 DIAGNOSIS — E785 Hyperlipidemia, unspecified: Secondary | ICD-10-CM | POA: Diagnosis not present

## 2018-11-23 DIAGNOSIS — I482 Chronic atrial fibrillation, unspecified: Secondary | ICD-10-CM | POA: Diagnosis not present

## 2018-11-23 DIAGNOSIS — I10 Essential (primary) hypertension: Secondary | ICD-10-CM | POA: Diagnosis not present

## 2018-11-23 DIAGNOSIS — D649 Anemia, unspecified: Secondary | ICD-10-CM | POA: Diagnosis not present

## 2018-11-23 DIAGNOSIS — Z8521 Personal history of malignant neoplasm of larynx: Secondary | ICD-10-CM | POA: Diagnosis not present

## 2018-11-23 DIAGNOSIS — B349 Viral infection, unspecified: Secondary | ICD-10-CM | POA: Diagnosis not present

## 2018-11-23 DIAGNOSIS — D696 Thrombocytopenia, unspecified: Secondary | ICD-10-CM | POA: Diagnosis not present

## 2018-11-23 DIAGNOSIS — I420 Dilated cardiomyopathy: Secondary | ICD-10-CM | POA: Diagnosis not present

## 2018-11-23 DIAGNOSIS — I441 Atrioventricular block, second degree: Secondary | ICD-10-CM | POA: Diagnosis not present

## 2018-11-23 DIAGNOSIS — Z471 Aftercare following joint replacement surgery: Secondary | ICD-10-CM | POA: Diagnosis not present

## 2018-11-23 DIAGNOSIS — J189 Pneumonia, unspecified organism: Secondary | ICD-10-CM | POA: Diagnosis not present

## 2018-11-23 DIAGNOSIS — Z8673 Personal history of transient ischemic attack (TIA), and cerebral infarction without residual deficits: Secondary | ICD-10-CM | POA: Diagnosis not present

## 2018-11-23 DIAGNOSIS — H409 Unspecified glaucoma: Secondary | ICD-10-CM | POA: Diagnosis not present

## 2018-11-23 DIAGNOSIS — Z85038 Personal history of other malignant neoplasm of large intestine: Secondary | ICD-10-CM | POA: Diagnosis not present

## 2018-11-23 DIAGNOSIS — R278 Other lack of coordination: Secondary | ICD-10-CM | POA: Diagnosis not present

## 2018-11-23 DIAGNOSIS — D6959 Other secondary thrombocytopenia: Secondary | ICD-10-CM | POA: Diagnosis not present

## 2018-11-23 DIAGNOSIS — M2550 Pain in unspecified joint: Secondary | ICD-10-CM | POA: Diagnosis not present

## 2018-11-23 DIAGNOSIS — N183 Chronic kidney disease, stage 3 (moderate): Secondary | ICD-10-CM | POA: Diagnosis not present

## 2018-11-23 DIAGNOSIS — R2689 Other abnormalities of gait and mobility: Secondary | ICD-10-CM | POA: Diagnosis not present

## 2018-11-23 DIAGNOSIS — A4189 Other specified sepsis: Secondary | ICD-10-CM | POA: Diagnosis not present

## 2018-11-23 DIAGNOSIS — N179 Acute kidney failure, unspecified: Secondary | ICD-10-CM | POA: Diagnosis not present

## 2018-11-23 DIAGNOSIS — Z9181 History of falling: Secondary | ICD-10-CM | POA: Diagnosis not present

## 2018-11-23 DIAGNOSIS — Z515 Encounter for palliative care: Secondary | ICD-10-CM | POA: Diagnosis not present

## 2018-11-23 DIAGNOSIS — Z79899 Other long term (current) drug therapy: Secondary | ICD-10-CM | POA: Diagnosis not present

## 2018-11-23 DIAGNOSIS — Z96642 Presence of left artificial hip joint: Secondary | ICD-10-CM | POA: Diagnosis not present

## 2018-11-23 DIAGNOSIS — R498 Other voice and resonance disorders: Secondary | ICD-10-CM | POA: Diagnosis not present

## 2018-11-23 DIAGNOSIS — I480 Paroxysmal atrial fibrillation: Secondary | ICD-10-CM | POA: Diagnosis not present

## 2018-11-23 DIAGNOSIS — S72002A Fracture of unspecified part of neck of left femur, initial encounter for closed fracture: Secondary | ICD-10-CM | POA: Diagnosis not present

## 2018-11-23 DIAGNOSIS — R5381 Other malaise: Secondary | ICD-10-CM | POA: Diagnosis not present

## 2018-11-23 DIAGNOSIS — A419 Sepsis, unspecified organism: Secondary | ICD-10-CM | POA: Diagnosis not present

## 2018-11-23 DIAGNOSIS — I700 Atherosclerosis of aorta: Secondary | ICD-10-CM | POA: Diagnosis not present

## 2018-11-23 DIAGNOSIS — I5022 Chronic systolic (congestive) heart failure: Secondary | ICD-10-CM | POA: Diagnosis not present

## 2018-11-23 DIAGNOSIS — I2510 Atherosclerotic heart disease of native coronary artery without angina pectoris: Secondary | ICD-10-CM | POA: Diagnosis not present

## 2018-11-23 DIAGNOSIS — R69 Illness, unspecified: Secondary | ICD-10-CM | POA: Diagnosis present

## 2018-11-23 DIAGNOSIS — I959 Hypotension, unspecified: Secondary | ICD-10-CM | POA: Diagnosis not present

## 2018-11-23 DIAGNOSIS — R7309 Other abnormal glucose: Secondary | ICD-10-CM | POA: Diagnosis not present

## 2018-11-23 DIAGNOSIS — M1990 Unspecified osteoarthritis, unspecified site: Secondary | ICD-10-CM | POA: Diagnosis not present

## 2018-11-23 DIAGNOSIS — J1289 Other viral pneumonia: Secondary | ICD-10-CM | POA: Diagnosis not present

## 2018-11-23 DIAGNOSIS — R1312 Dysphagia, oropharyngeal phase: Secondary | ICD-10-CM | POA: Diagnosis not present

## 2018-11-23 LAB — GLUCOSE, CAPILLARY
Glucose-Capillary: 89 mg/dL (ref 70–99)
Glucose-Capillary: 90 mg/dL (ref 70–99)
Glucose-Capillary: 84 mg/dL (ref 70–99)
Glucose-Capillary: 136 mg/dL — ABNORMAL HIGH (ref 70–99)
Glucose-Capillary: 162 mg/dL — ABNORMAL HIGH (ref 70–99)

## 2018-11-23 MED ORDER — MUPIROCIN 2 % EX OINT
TOPICAL_OINTMENT | CUTANEOUS | Status: AC
  Administered 2018-11-23: 10:00:00
  Filled 2018-11-23: qty 22

## 2018-11-23 NOTE — Progress Notes (Signed)
[facility] was called [phone] IA the receptionist was unable to locate a nurse to give report.  A supervisor's number was called [phone] unable to get an answer but left a brief message in regards to giving report on this patient.

## 2018-11-23 NOTE — Progress Notes (Signed)
Another attempt was made to give report to the staff at [facility].  No answer when called.  A brief message was left regarding the need to give report on this patient.  Plans for [facility] to pick the patient up for transfer at 3 pm today

## 2018-11-23 NOTE — TOC Transition Note (Signed)
Transition of Care [facility]) - CM/SW Discharge Note   Patient Details  Name: Timothy Suarez MRN: [id number] Date of Birth: [birth year]  Transition of Care [facility]) CM/SW Contact:  [name], LCSW Phone Number: 11/23/2018, 11:01 AM   Clinical Narrative:     Patient will DC to: [place] Anticipated DC date: 11/23/2018 Family notified: Hilda Blades Transport by: Corey Harold  Per MD patient ready for DC to [place] . RN, patient, patient's family, and facility notified of DC. Discharge Summary sent to facility. RN given number for report [phone]. DC packet on chart. Ambulance transport requested for patient for 3:00 pm.  CSW signing off.  [place], Alex   Final next level of care: Skilled Nursing Facility Barriers to Discharge: No Barriers Identified   Patient Goals and CMS Choice Patient states their goals for this hospitalization and ongoing recovery are:: to go to rehab then home CMS Medicare.gov Compare Post Acute Care list provided to:: Patient Represenative (must comment)(Debra (daughter)) Choice offered to / list presented to : Adult Children  Discharge Placement PASRR number recieved: 11/18/18            Patient [facility] bed at: [facility] Patient to be transferred to facility by: Leia Coletti Name of family member notified: Debra Patient and family notified of of transfer: 11/23/18  Discharge Plan and Services In-house Referral: NA Discharge Planning Services: NA Post Acute Care Choice: [place]          DME Arranged: N/A DME Agency: NA HH Arranged: NA     Social Determinants of Health (SDOH) Interventions     Readmission Risk Interventions No flowsheet data found.

## 2018-11-23 NOTE — Progress Notes (Signed)
Physical Therapy Treatment Patient Details Name: Timothy Suarez MRN: [id number] DOB: [birth year] Today's Date: 11/23/2018    History of Present Illness Pt is a 85 y.o. male admitted on 11/16/18 after falling at [place] and sustaining a L femoral neck fx. Now s/p  L direct anterior partial hip arthroplasty on 11/18/18 (direct anterior approach; WBAT). PMH includes thrombocytopenia, wandering aortic pacemaker, R vocal cord CA with trach, RBB, PVCs, PAF, MVP, HTN, glaucoma, CAD, colon CA, CKD, cerebral ischemia, R TKA, MVR, laryngectomy, colectomy.      PT Comments    Pt performed gait training with +1 min to moderate assistance.  As patient fatigues he requires increased assistance.  Plan for SNF placement remains appropriate.     Follow Up Recommendations  SNF;Supervision/Assistance - 24 hour     Equipment Recommendations  Other (comment)(TBD)    Recommendations for Other Services Rehab consult     Precautions / Restrictions Precautions Precautions: Fall Precaution Comments: Vent/trach, electrolarynx Restrictions Weight Bearing Restrictions: No LLE Weight Bearing: Weight bearing as tolerated    Mobility  Bed Mobility Overal bed mobility: Needs Assistance Bed Mobility: Sit to Supine       Sit to supine: Min assist   General bed mobility comments: Min assistance to lift LLE against gravity back to bed.  Pt able to boost himself to [facility] with use of rails.   Transfers Overall transfer level: Needs assistance Equipment used: Rolling walker (2 wheeled) Transfers: Sit to/from Stand Sit to Stand: Mod assist         General transfer comment: Pt required moderate assistance to boost into standing.  Pt with flexed hip and forward posture.  Cues for upper trunk control and hip extension,  reports mild dizziness in standing.    Ambulation/Gait Ambulation/Gait assistance: Min assist;Mod assist Gait Distance (Feet): 25 Feet Assistive device: Rolling walker (2  wheeled) Gait Pattern/deviations: Step-to pattern;Antalgic;Trunk flexed     General Gait Details: Pt required cues for posture and increasing B stride length.  Pt fatigues quickly and would benefit from chair follow.  Pt required moderate assistance as he began to fatigue   Stairs             Wheelchair Mobility    Modified Rankin (Stroke Patients Only)       Balance Overall balance assessment: Needs assistance           Standing balance-Leahy Scale: Poor                              Cognition Arousal/Alertness: Awake/alert Behavior During Therapy: WFL for tasks assessed/performed Overall Cognitive Status: Impaired/Different from baseline Area of Impairment: Attention;Memory;Following commands;Safety/judgement;Problem solving;Awareness                 Orientation Level: Disoriented to;Time;Situation;Place Current Attention Level: Sustained Memory: Decreased short-term memory;Decreased recall of precautions Following Commands: Follows one step commands with increased time;Follows multi-step commands inconsistently Safety/Judgement: Decreased awareness of safety;Decreased awareness of deficits Awareness: Intellectual Problem Solving: Requires verbal cues;Requires tactile cues        Exercises      General Comments        Pertinent Vitals/Pain Pain Assessment: Faces Faces Pain Scale: Hurts a little bit Pain Location: left hip Pain Descriptors / Indicators: Guarding Pain Intervention(s): Monitored during session;Repositioned    Home Living  Prior Function            PT Goals (current goals can now be found in the care plan section) Acute Rehab PT Goals Patient Stated Goal: Family would like him to go to SNF for rehab Potential to Achieve Goals: Good Progress towards PT goals: Progressing toward goals    Frequency    Min 3X/week      PT Plan Current plan remains appropriate     Co-evaluation              AM-PAC PT "6 Clicks" Mobility   Outcome Measure  Help needed turning from your back to your side while in a flat bed without using bedrails?: A Little Help needed moving from lying on your back to sitting on the side of a flat bed without using bedrails?: A Little Help needed moving to and from a bed to a chair (including a wheelchair)?: A Lot Help needed standing up from a chair using your arms (e.g., wheelchair or bedside chair)?: A Lot Help needed to walk in hospital room?: A Lot Help needed climbing 3-5 steps with a railing? : A Lot 6 Click Score: 14    End of Session Equipment Utilized During Treatment: Gait belt Activity Tolerance: Patient tolerated treatment well Patient left: in chair;with call bell/phone within reach;with chair alarm set Nurse Communication: Mobility status PT Visit Diagnosis: Muscle weakness (generalized) (M62.81);Difficulty in walking, not elsewhere classified (R26.2);Pain Pain - Right/Left: Left Pain - part of body: Hip     Time: 5997-7414 PT Time Calculation (min) (ACUTE ONLY): 17 min  Charges:  $Gait Training: 8-22 mins                     Governor Rooks, PTA Acute Rehabilitation Services Pager [phone] Office [phone]     Kattia Selley Eli Hose 11/23/2018, 12:27 PM

## 2018-11-23 NOTE — Discharge Summary (Signed)
Physician Discharge Summary  Timothy Suarez [id number] DOB: [birth year] DOA: 11/16/2018  PCP: Gayland Curry, DO  Admit date: 11/15/2081 Discharge date: 11/23/2018  Admitted From: [facility] Disposition: Skilled nursing facility-[facility]  Recommendations for Outpatient Follow-up:  1. Follow up with PCP in 1-2 weeks 2. Please obtain BMP/CBC in one week your next doctors visit.  3. Change Lasix to 20 mg orally daily per cardiology 4. Per cardiology follow-up outpatient as needed   Discharge Condition: Stable CODE STATUS: DNR Diet recommendation: Cardiac  Brief/Interim Summary: 85 y.o.malewith medical history significant ofCKD stage III, nonobstructive CAD, chronic systolic CHF with dilated cardiomyopathy (EF 20 to 25%), second-degree AV block type I and II, mitral valve prolapse with severe mitral regurgitation status post repair, paroxysmal atrial fibrillation, thoracic aortic aneurysm, hypertension, hyperlipidemia, mild cognitive impairment, CVA, colon cancer, vocal cord cancer status post tracheostomy presenting to the hospital via EMS for evaluation of an unwitnessed fall. Patient admitted with left femoral neck fracture, underwent prostatic replacement for femoral neck fracture on 11-18-2018. Patient mentation returned back to normal and blood glucose was staying stable after having episode of hypoglycemia.  He was tolerating oral diet without any issues.  Hospital course was also complicated by fluid overload therefore cardiology was consulted.  Patient was diuresed with IV Lasix and on the day of discharge appeared euvolemic. Patient has reached max and benefit from hospital stay and stable for discharge today.   Discharge Diagnoses:  Principal Problem:   Closed displaced fracture of left femoral neck (HCC) Active Problems:   Paroxysmal atrial fibrillation (HCC)   Chronic systolic congestive heart failure (HCC)   Acute on chronic combined systolic and diastolic CHF  (congestive heart failure) ([place])   Fall   AKI (acute kidney injury) ([place])  Mechanical fall causing left femoral neck fracture -Status post prosthetic replacement of the left femoral neck per orthopedic 11/18/2018.  Pain medication prescribed.  Transition to skilled nursing facility.  Acute systolic congestive heart failure exacerbation with reduced ejection fraction, 20% -Appears to be euvolemic in nature today.  Cardiology recommends Lasix 20 mg orally daily.  Resume rest of the medications.  Acute metabolic encephalopathy -Secondary to medications and hypoglycemia.  Although this has resolved.  Mentation is back at his baseline.  Hyperglycemia, resolved  Paroxysmal atrial fibrillation -Resume home medications including Eliquis  History of vocal cord cancer status post tracheostomy  Discharged today in stable condition CODE STATUS DNR.  DNR form signed.  Consultations:  Cardiology  Orthopedic  Subjective: No complaints, feels back to himself except slight hip pain.  He is in good spirits.  Mentation is at his baseline.  Discharge Exam: Vitals:   11/23/18 0501 11/23/18 0855  BP: 120/65   Pulse: 93 83  Resp: 16 16  Temp: 98.2 F (36.8 C)   SpO2: 94% 93%   Vitals:   11/22/18 2118 11/22/18 2348 11/23/18 0501 11/23/18 0855  BP:  (!) 126/91 120/65   Pulse: 63 (!) 107 93 83  Resp: 16 18 16 16   Temp:  98.6 F (37 C) 98.2 F (36.8 C)   TempSrc:  Oral Oral   SpO2: 96% 97% 94% 93%  Weight:      Height:        General: Pt is alert, awake, not in acute distress Cardiovascular: RRR, S1/S2 +, no rubs, no gallops Respiratory: CTA bilaterally, no wheezing, no rhonchi Abdominal: Soft, NT, ND, bowel sounds + Extremities: no edema, no cyanosis Tracheostomy in place.  Left hip dressing appears to  be dry and clean Alert awake oriented X3.  Discharge Instructions  Discharge Instructions    Weight bearing as tolerated   Complete by:  As directed      Allergies as of  11/23/2018      Reactions   Metoprolol Other (See Comments)   Hypersensitive to beta blockers with associated hypotension and bradycardia   Citalopram Nausea Only   Trazodone And Nefazodone Other (See Comments)   Dry mouth   Zoloft [sertraline Hcl] Other (See Comments)   dizzy      Medication List    STOP taking these medications   furosemide 40 MG tablet Commonly known as:  LASIX     TAKE these medications   atorvastatin 40 MG tablet Commonly known as:  LIPITOR TAKE ONE TABLET BY MOUTH DAILY What changed:  when to take this   Eliquis 5 MG Tabs tablet Generic drug:  apixaban TAKE ONE TABLET BY MOUTH TWICE A DAY What changed:  how much to take   HYDROcodone-acetaminophen 5-325 MG tablet Commonly known as:  Norco Take 1-2 tablets by mouth every 6 (six) hours as needed.   Rocklatan 0.02-0.005 % Soln Generic drug:  Netarsudil-Latanoprost Place 1 drop into both eyes at bedtime.   zolpidem 5 MG tablet Commonly known as:  AMBIEN TAKE ONE TABLET BY MOUTH DAILY AS NEEDED FOR SLEEP What changed:  See the new instructions.            Discharge Care Instructions  (From admission, onward)         Start     Ordered   11/18/18 0000  Weight bearing as tolerated     11/18/18 0919          Contact information for follow-up providers    Leandrew Koyanagi, MD In 2 weeks.   Specialty:  Orthopedic Surgery Why:  For suture removal, For wound re-check Contact information: [place] [id number] [phone]            Contact information for after-discharge care    Destination    [facility] .   Service:  Skilled Nursing Contact information: [place] [address] [phone]                 Allergies  Allergen Reactions  . Metoprolol Other (See Comments)    Hypersensitive to beta blockers with associated hypotension and bradycardia  . Citalopram Nausea Only  . Trazodone And  Nefazodone Other (See Comments)    Dry mouth  . Zoloft [Sertraline Hcl] Other (See Comments)    dizzy    You were cared for by a hospitalist during your hospital stay. If you have any questions about your discharge medications or the care you received while you were in the hospital after you are discharged, you can call the unit and asked to speak with the hospitalist on call if the hospitalist that took care of you is not available. Once you are discharged, your primary care physician will handle any further medical issues. Please note that no refills for any discharge medications will be authorized once you are discharged, as it is imperative that you return to your primary care physician (or establish a relationship with a primary care physician if you do not have one) for your aftercare needs so that they can reassess your need for medications and monitor your lab values.   Procedures/Studies: Dg Chest 1 View  Result Date: 11/17/2018 CLINICAL DATA:  Initial  evaluation for acute hip fracture, preoperative evaluation. EXAM: CHEST  1 VIEW COMPARISON:  Prior radiograph from 08/10/2017. FINDINGS: Advanced cardiomegaly, stable. Mediastinal silhouette within normal limits. Aortic atherosclerosis. Lungs mildly hypoinflated. Mild diffuse pulmonary interstitial congestion without frank alveolar edema. Suspected small bilateral pleural effusions. No consolidative opacity. No pneumothorax. No acute osseous finding. IMPRESSION: 1. Cardiomegaly with mild diffuse pulmonary interstitial congestion and small bilateral pleural effusions. 2. Aortic atherosclerosis. Electronically Signed   By: Jeannine Boga M.D.   On: 11/17/2018 01:20   Dg Elbow Complete Left  Result Date: 11/17/2018 CLINICAL DATA:  Initial evaluation for acute trauma, fall. EXAM: LEFT ELBOW - COMPLETE 3+ VIEW COMPARISON:  None. FINDINGS: No acute fracture dislocation. No joint effusion. Radial head intact. Mild degenerative spurring at the  medial and lateral epicondyles noted. No acute soft tissue abnormality. IMPRESSION: No acute osseous abnormality about the left elbow. Electronically Signed   By: Jeannine Boga M.D.   On: 11/17/2018 00:48   Ct Head Wo Contrast  Result Date: 11/17/2018 CLINICAL DATA:  Initial evaluation for acute trauma, fall. EXAM: CT HEAD WITHOUT CONTRAST TECHNIQUE: Contiguous axial images were obtained from the base of the skull through the vertex without intravenous contrast. COMPARISON:  Prior MRI from 08/12/2017. FINDINGS: Brain: Moderately advanced age-related cerebral atrophy with chronic small vessel ischemic disease. Superimposed small remote left cerebellar infarct. No acute intracranial hemorrhage. No acute large vessel territory infarct. No mass lesion, midline shift or mass effect. No hydrocephalus. No extra-axial fluid collection. Vascular: No hyperdense vessel. Calcified atherosclerosis present at the skull base. Skull: Scalp soft tissues within normal limits.  Calvarium intact. Sinuses/Orbits: Globes and orbital soft tissues demonstrate no acute finding. Mild mucosal thickening within the ethmoidal air cells and maxillary sinuses. Paranasal sinuses are otherwise clear. Trace bilateral mastoid effusions, of doubtful significance. Other: None. IMPRESSION: 1. No acute intracranial abnormality. 2. Moderately advanced age-related cerebral atrophy with chronic microvascular ischemic disease. Electronically Signed   By: Jeannine Boga M.D.   On: 11/17/2018 01:17   Pelvis Portable  Result Date: 11/18/2018 CLINICAL DATA:  Status post left hip replacement EXAM: PORTABLE PELVIS 1-2 VIEWS COMPARISON:  None. FINDINGS: Generalized osteopenia. Interval left hip arthroplasty without hardware failure or complication. Postsurgical changes in the surrounding soft tissues. No acute fracture or dislocation. Mild osteoarthritis of the right hip. IMPRESSION: Interval left hip arthroplasty. Electronically Signed   By:  Kathreen Devoid   On: 11/18/2018 10:18   Dg Chest Port 1 View  Result Date: 11/21/2018 CLINICAL DATA:  Low oxygen saturation.  Cyanotic.  Lethargy. EXAM: PORTABLE CHEST 1 VIEW COMPARISON:  11/16/2018; 08/10/2017 FINDINGS: Grossly unchanged enlarged cardiac silhouette and mediastinal contours with atherosclerotic plaque within thoracic aorta. Post valve replacement. Pulmonary vasculature remains indistinct with cephalization of flow. Unchanged trace bilateral effusions with associated bibasilar opacities, left greater than right. No new focal airspace opacities. No pneumothorax. No acute osseus abnormalities. IMPRESSION: Similar findings of cardiomegaly, mild pulmonary edema, trace pleural effusions and associated bibasilar opacities, left greater than right, similar to the 08/2017 examination Electronically Signed   By: Sandi Mariscal M.D.   On: 11/21/2018 13:06   Dg Knee Complete 4 Views Left  Result Date: 11/17/2018 CLINICAL DATA:  Initial evaluation for acute trauma, fall. EXAM: LEFT KNEE - COMPLETE 4+ VIEW COMPARISON:  None. FINDINGS: No acute fracture dislocation. No joint effusion. Advanced osteoarthritic changes about the knee. No acute soft tissue abnormality. Prominent atherosclerotic change noted. IMPRESSION: 1. No acute osseous abnormality about the left knee. 2. Advanced degenerative  osteoarthrosis. Electronically Signed   By: Jeannine Boga M.D.   On: 11/17/2018 00:46   Dg Abd Portable 1v  Result Date: 11/21/2018 CLINICAL DATA:  Constipation. EXAM: PORTABLE ABDOMEN - 1 VIEW COMPARISON:  CT scan of the abdomen dated 02/27/2016 FINDINGS: The bowel gas pattern is normal. There is no excessive stool in the colon. No fecal impaction. Aortic atherosclerosis with tortuosity of the thoracic aorta. Lumbar scoliosis with deformity of the L1 vertebral body, unchanged. Left proximal femoral prosthesis in place. IMPRESSION: No acute abnormality of the abdomen. Specifically, no evidence of excessive stool  or fecal impaction. Aortic Atherosclerosis (ICD10-I70.0). Electronically Signed   By: Lorriane Shire M.D.   On: 11/21/2018 10:39   Dg C-arm 1-60 Min  Result Date: 11/18/2018 CLINICAL DATA:  Left hip replacement EXAM: OPERATIVE left HIP (WITH PELVIS IF PERFORMED) 2 VIEWS TECHNIQUE: Fluoroscopic spot image(s) were submitted for interpretation post-operatively. COMPARISON:  For a 2020 FINDINGS: Bipolar hip replacement on the left. Components appear well positioned. No radiographically detectable complication. IMPRESSION: Good appearance following bipolar hip replacement on the left. Electronically Signed   By: Nelson Chimes M.D.   On: 11/18/2018 09:37   Dg Hip Operative Unilat W Or W/o Pelvis Left  Result Date: 11/18/2018 CLINICAL DATA:  Left hip replacement EXAM: OPERATIVE left HIP (WITH PELVIS IF PERFORMED) 2 VIEWS TECHNIQUE: Fluoroscopic spot image(s) were submitted for interpretation post-operatively. COMPARISON:  For a 2020 FINDINGS: Bipolar hip replacement on the left. Components appear well positioned. No radiographically detectable complication. IMPRESSION: Good appearance following bipolar hip replacement on the left. Electronically Signed   By: Nelson Chimes M.D.   On: 11/18/2018 09:37   Dg Hip Unilat W Or Wo Pelvis 2-3 Views Left  Result Date: 11/17/2018 CLINICAL DATA:  Initial evaluation for acute trauma, fall. EXAM: DG HIP (WITH OR WITHOUT PELVIS) 2-3V LEFT COMPARISON:  None. FINDINGS: There is an acute minimally displaced and impacted subcapital fracture of the left femoral neck. Left femoral head remains normally position within the acetabulum. Femoral head height maintained. Bony pelvis intact. Limited views of the right hip demonstrate no acute finding. No acute soft tissue abnormality. Prominent atherosclerosis noted within the proximal thighs. Vascular clips overlie the proximal right thigh. IMPRESSION: Acute mildly displaced subcapital fracture of the left femoral neck. Electronically  Signed   By: Jeannine Boga M.D.   On: 11/17/2018 00:45      The results of significant diagnostics from this hospitalization (including imaging, microbiology, ancillary and laboratory) are listed below for reference.     Microbiology: Recent Results (from the past 240 hour(s))  Surgical pcr screen     Status: None   Collection Time: 11/17/18  3:09 AM  Result Value Ref Range Status   MRSA, PCR NEGATIVE NEGATIVE Final   Staphylococcus aureus NEGATIVE NEGATIVE Final    Comment: (NOTE) The Xpert SA Assay (FDA approved for NASAL specimens in patients 23 years of age and older), is one component of a comprehensive surveillance program. It is not intended to diagnose infection nor to guide or monitor treatment. Performed at [facility], [place] [address], [place], [place] [address]      Labs: BNP (last 3 results) Recent Labs    11/17/18 0118  BNP 1,572.6*   Basic Metabolic Panel: Recent Labs  Lab 11/17/18 0329 11/18/18 0343 11/19/18 0254 11/20/18 0302 11/21/18 0852 11/21/18 1208 11/22/18 0822  NA 142 143 141 142 143  --  143  K 3.3* 3.4* 3.4* 3.4* 3.7  --  3.7  CL 108 111 103 108 111  --  106  CO2 19* 22 23 23 24   --  26  GLUCOSE 115* 171* 144* 99 85  --  92  BUN 29* 21 23 27* 25*  --  23  CREATININE 1.58* 1.34* 1.51* 1.43* 1.13  --  1.16  CALCIUM 8.7* 8.6* 8.5* 8.5* 8.5*  --  8.4*  MG 2.2  --   --   --   --  2.4  --    Liver Function Tests: No results for input(s): AST, ALT, ALKPHOS, BILITOT, PROT, ALBUMIN in the last 168 hours. No results for input(s): LIPASE, AMYLASE in the last 168 hours. No results for input(s): AMMONIA in the last 168 hours. CBC: Recent Labs  Lab 11/17/18 0118  11/19/18 0254 11/20/18 0302 11/21/18 0330 11/22/18 0256 11/22/18 0822  WBC 7.4   < > 9.9 9.8 8.6 8.8 8.2  NEUTROABS 5.6  --   --   --   --   --   --   HGB 15.4   < > 13.5 13.8 13.4 14.1 14.4  HCT 49.0   < > 40.7 42.5 40.6 42.5 43.8  MCV 101.0*   < > 100.2* 98.2  99.5 98.6 99.3  PLT 150   < > 116* 120* 114* 117* 117*   < > = values in this interval not displayed.   Cardiac Enzymes: Recent Labs  Lab 11/17/18 0329 11/21/18 1208  TROPONINI 0.03* 0.03*   BNP: Invalid input(s): POCBNP CBG: Recent Labs  Lab 11/22/18 2350 11/23/18 0137 11/23/18 0503 11/23/18 0822 11/23/18 1010  GLUCAP 111* 89 90 84 136*   D-Dimer No results for input(s): DDIMER in the last 72 hours. Hgb A1c No results for input(s): HGBA1C in the last 72 hours. Lipid Profile No results for input(s): CHOL, HDL, LDLCALC, TRIG, CHOLHDL, LDLDIRECT in the last 72 hours. Thyroid function studies No results for input(s): TSH, T4TOTAL, T3FREE, THYROIDAB in the last 72 hours.  Invalid input(s): FREET3 Anemia work up No results for input(s): VITAMINB12, FOLATE, FERRITIN, TIBC, IRON, RETICCTPCT in the last 72 hours. Urinalysis    Component Value Date/Time   COLORURINE YELLOW 11/21/2018 1310   APPEARANCEUR CLEAR 11/21/2018 1310   LABSPEC 1.013 11/21/2018 1310   PHURINE 6.0 11/21/2018 1310   GLUCOSEU 50 (A) 11/21/2018 1310   HGBUR NEGATIVE 11/21/2018 1310   BILIRUBINUR NEGATIVE 11/21/2018 1310   KETONESUR NEGATIVE 11/21/2018 1310   PROTEINUR NEGATIVE 11/21/2018 1310   UROBILINOGEN 0.2 01/26/2011 1220   NITRITE NEGATIVE 11/21/2018 1310   LEUKOCYTESUR NEGATIVE 11/21/2018 1310   Sepsis Labs Invalid input(s): PROCALCITONIN,  WBC,  LACTICIDVEN Microbiology Recent Results (from the past 240 hour(s))  Surgical pcr screen     Status: None   Collection Time: 11/17/18  3:09 AM  Result Value Ref Range Status   MRSA, PCR NEGATIVE NEGATIVE Final   Staphylococcus aureus NEGATIVE NEGATIVE Final    Comment: (NOTE) The Xpert SA Assay (FDA approved for NASAL specimens in patients 60 years of age and older), is one component of a comprehensive surveillance program. It is not intended to diagnose infection nor to guide or monitor treatment. Performed at [facility], [place] [address] [place], [place] [address]      Time coordinating discharge:  I have spent 35 minutes face to face with the patient and on the ward discussing the patients care, assessment, plan and disposition with other care givers. >50% of the time was devoted counseling the  patient about the risks and benefits of treatment/Discharge disposition and coordinating care.   SIGNED:   Damita Lack, MD  [facility] 11/23/2018, 10:18 AM   If 7PM-7AM, please contact night-coverage [email or web address]

## 2018-11-24 DIAGNOSIS — D6949 Other primary thrombocytopenia: Secondary | ICD-10-CM | POA: Diagnosis not present

## 2018-11-24 DIAGNOSIS — D6959 Other secondary thrombocytopenia: Secondary | ICD-10-CM | POA: Diagnosis not present

## 2018-11-24 DIAGNOSIS — I700 Atherosclerosis of aorta: Secondary | ICD-10-CM | POA: Diagnosis not present

## 2018-11-24 DIAGNOSIS — I480 Paroxysmal atrial fibrillation: Secondary | ICD-10-CM | POA: Diagnosis not present

## 2018-11-26 ENCOUNTER — Other Ambulatory Visit: Payer: Self-pay | Admitting: Internal Medicine

## 2018-11-28 MED ORDER — FUROSEMIDE 20 MG PO TABS: 20.0000 mg | ORAL_TABLET | Freq: Every day | ORAL | 1 refills | Status: AC

## 2018-12-06 DIAGNOSIS — I5022 Chronic systolic (congestive) heart failure: Secondary | ICD-10-CM | POA: Diagnosis not present

## 2018-12-06 DIAGNOSIS — R7309 Other abnormal glucose: Secondary | ICD-10-CM | POA: Diagnosis not present

## 2018-12-06 DIAGNOSIS — I480 Paroxysmal atrial fibrillation: Secondary | ICD-10-CM | POA: Diagnosis not present

## 2018-12-06 DIAGNOSIS — R296 Repeated falls: Secondary | ICD-10-CM | POA: Diagnosis not present

## 2018-12-21 ENCOUNTER — Ambulatory Visit (INDEPENDENT_AMBULATORY_CARE_PROVIDER_SITE_OTHER): Payer: Medicare Other

## 2018-12-21 ENCOUNTER — Other Ambulatory Visit: Payer: Self-pay

## 2018-12-21 ENCOUNTER — Ambulatory Visit (INDEPENDENT_AMBULATORY_CARE_PROVIDER_SITE_OTHER): Payer: Medicare Other | Admitting: Orthopaedic Surgery

## 2018-12-21 ENCOUNTER — Encounter: Payer: Self-pay | Admitting: Physician Assistant

## 2018-12-21 DIAGNOSIS — Z96642 Presence of left artificial hip joint: Secondary | ICD-10-CM

## 2018-12-21 MED ORDER — CEPHALEXIN 500 MG PO CAPS: ORAL_CAPSULE | ORAL | 0 refills | Status: AC

## 2018-12-21 NOTE — Progress Notes (Signed)
Post-Op Visit Note   Patient: Timothy Suarez           Date of Birth: 03/23/1925           MRN: [id number] Visit Date: 12/21/2018 PCP: Gayland Curry, DO   Assessment & Plan:  Chief Complaint:  Chief Complaint  Patient presents with  . Left Hip - Routine Post Op   Visit Diagnoses:  1. History of hemiarthroplasty of left hip     Plan: Patient is a pleasant 85 year old gentleman who presents our clinic today 33 days status post left hip hemiarthroplasty, date of surgery 11/18/2018.  He has been residing at [facility].  He denies any fevers or chills.  He has minimal pain to the left hip.  Examination of left hip reveals a well healing surgical incision with nylon sutures in place.  He has slight erythema surrounding the sutures.  He does have a small but fluctuant seroma.  This is nontender.  No drainage.  Calves are soft and nontender.  He is neurovascular intact distally.  Sutures were removed today.  We have recommended ice and heat as well as 2 weeks of Keflex.  We have written this on his nursing facility paperwork as well as printed out a paper prescription for the Keflex should he leave early.  He will continue with physical therapy in the meantime.  Call with concerns or questions in the meantime.  Follow-Up Instructions: Return in about 4 weeks (around 01/18/2019).   Orders:  Orders Placed This Encounter  Procedures  . XR HIP UNILAT W OR W/O PELVIS 2-3 VIEWS LEFT   Meds ordered this encounter  Medications  . cephALEXin (KEFLEX) 500 MG capsule    Sig: Take one tab qid x 2 weeks    Dispense:  56 capsule    Refill:  0    Imaging: Xr Hip Unilat W Or W/o Pelvis 2-3 Views Left  Result Date: 12/21/2018 X-rays demonstrate a well-seated prosthesis without complication   PMFS History: Patient Active Problem List   Diagnosis Date Noted  . Closed displaced fracture of left femoral neck ([place]) 11/17/2018  . Fall 11/17/2018  . AKI (acute kidney injury) ([place]) 11/17/2018   . Oropharyngeal dysphagia 11/15/2017  . Alaryngeal voice 11/15/2017  . Cognitive deficits 09/06/2017  . Abnormal TSH 08/26/2017  . Thrombocytopenia ([place]) 08/26/2017  . Peripheral vascular disease ([place]) 08/26/2017  . Acute on chronic combined systolic and diastolic CHF (congestive heart failure) ([place]) 08/10/2017  . Hallucination 08/10/2017  . Balance problems 07/08/2017  . Baker cyst, left 07/08/2017  . Dilated cardiomyopathy ([place]) 04/19/2017  . Chronic systolic congestive heart failure ([place]) 03/30/2017  . Left ventricular ejection fraction of 21% to 30% 12/29/2016  . Pulmonary edema 12/06/2016  . Presbycusis of both ears 05/25/2016  . Protein-calorie malnutrition, severe 02/26/2016  . Pharyngocutaneous fistula 02/21/2016  . S/P laryngectomy 01/15/2016  . History of laryngeal cancer 12/31/2015  . Other fatigue 12/09/2015  . 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 Prostate cancer ([place]) 01/10/2015  . S/P MVR (mitral valve repair) 12/20/2014  . S/P Maze operation for atrial fibrillation 12/20/2014  . Obesity (BMI 30-39.9) 04/02/2014  . Prolonged Q-T interval on ECG 04/02/2014  . Mild cognitive impairment 04/02/2014  . Major depressive disorder, recurrent episode, mild ([place]) 02/08/2014  . Insomnia 02/08/2014  . Dizziness and giddiness 02/08/2014  . Generalized muscle weakness 02/08/2014  . Glaucoma 02/08/2014  . Colon cancer ([place])   .  Coronary artery disease   . Moderate aortic insufficiency 01/03/2013  . Mild mitral regurgitation 01/03/2013  . Primary open angle glaucoma 12/03/2011  . MVP (mitral valve prolapse)   . Paroxysmal atrial fibrillation (H[facility])   . HTN (hypertension)   . Depression   . chest wall hernia (lung hernia)    Past Medical History:  Diagnosis Date  . Arthritis   . Cerebral ischemia   . CKD (chronic kidney disease), stage III ([place])   . Colon cancer [facility])     s/p partial colectomy  . Complication of anesthesia     "woke up w/confusion and hallucinations once after mitral valve OR"  . Coronary artery disease    a. pre-surgical cath 2010 showed 50-60% of the LAD with negative nuclear stress test at that time. b. normal nuc 2016.  . First degree AV block   . Glaucoma   . Hernia   . HTN (hypertension)    takes Amlodipine daily  . Hyperlipidemia   . Major depression   . Mild cognitive impairment   . Mitral regurgitation    a. mild-mod MR by echo 08/2017.  . Mobitz type 1 second degree atrioventricular block   . Mobitz type 2 second degree atrioventricular block   . Moderate aortic insufficiency   . MVP (mitral valve prolapse)    S/P Rt mini thoractomy for Mitral Valve repair  . Paroxysmal atrial fibrillation (HCC)    S/P Maze procedure  . 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 Premature atrial contractions   . Prostate cancer ([place])   . PVC's (premature ventricular contractions)   . RBBB   . 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 Sleep disturbance   . Thoracic aortic aneurysm ([place])    a. last echo 08/2017: mildly dilated aortic root.  . Thrombocytopenia ([place])   . Wandering atrial pacemaker     Family History  Problem Relation Age of Onset  . Cancer Mother        lymphoma  . Cancer Father        pancreatic  . Heart attack Neg Hx   . Stroke Neg Hx     Past Surgical History:  Procedure Laterality Date  . CATARACT EXTRACTION W/ INTRAOCULAR LENS  IMPLANT, BILATERAL Bilateral   . COLECTOMY  2004   Dr [name]  . DIRECT LARYNGOSCOPY N/A 01/15/2016   Procedure: DIRECT LARYNGOSCOPY WITH BIOPSY AND FROZEN SECTION;  Surgeon: Izora Gala, MD;  Location: [place];  Service: ENT;  Laterality: N/A;  . FlexHD patch repair of chest wall hernia.  01/28/2011   [name]  . GASTROSTOMY W/ FEEDING TUBE    . IR GENERIC HISTORICAL  10/08/2016   IR GASTROSTOMY TUBE REMOVAL 10/08/2016 Sandi Mariscal, MD [facility]-INTERV RAD  . JOINT REPLACEMENT    .  LARYNGETOMY N/A 01/15/2016   Procedure:  TOTAL LARYNGECTOMY;  Surgeon: Izora Gala, MD;  Location[facility] OR;  Service: ENT;  Laterality: N/A;  . MAZE  12/27/2008   left side lesion set  . MICROLARYNGOSCOPY Right 01/29/2015   Procedure: MICROLARYNGOSCOPY WITH BIOSPY OF RIGHT VOCAL CORD;  Surgeon: Izora Gala, MD;  Location: [place];  Service: ENT;  Laterality: Right;  . MITRAL VALVE REPAIR  12/27/2008   complex valvuloplasty with 69mm Memo 3D annuloplasty ring via right minithoracotomy  . PECTORALIS FLAP Left 05/04/2016   Procedure: PECTORALIS FLAP to neck with possible, split thickness skin graft;  Surgeon: Loel Lofty  Dillingham, DO;  Location: [place];  Service: Plastics;  Laterality: Left;  . PROSTATE BIOPSY    . SKIN SPLIT GRAFT Left 05/04/2016   Procedure: PECTORALIS MAJOR MYOCUTANEOUS FLAP RECONSTRUCTION OF PHARYNX AND SPLIT THICKNESS SKIN GRAFT;  Surgeon: Izora Gala, MD;  Location: [place];  Service: ENT;  Laterality: Left;  . TEE WITHOUT CARDIOVERSION N/A 01/03/2013   Procedure: TRANSESOPHAGEAL ECHOCARDIOGRAM (TEE);  Surgeon: Sueanne Margarita, MD;  Location: [place];  Service: Cardiovascular;  Laterality: N/A;  . TOTAL HIP ARTHROPLASTY Left 11/18/2018   Procedure: PATIAL HIP ARTHROPLASTY ANTERIOR APPROACH;  Surgeon: Leandrew Koyanagi, MD;  Location: [place];  Service: Orthopedics;  Laterality: Left;  . TOTAL KNEE ARTHROPLASTY Right 2003  . TRACHEAL ESOPHAGEAL PUNCTURE REPAIR N/A 03/26/2016   Procedure: TRACHEAL ESOPHAGEAL PUNCTURE;  Surgeon: Izora Gala, MD;  Location[facility] OR;  Service: ENT;  Laterality: N/A;  . TRACHEOESOPHAGEAL FISTULA REPAIR N/A 03/26/2016   Procedure: TRACHEO-ESOPHAGEAL   PUNCTURE,FISTULAR CLOSURE;  Surgeon: Izora Gala, MD;  Location[facility] OR;  Service: ENT;  Laterality: N/A;  . TRACHEOESOPHAGEAL FISTULA REPAIR N/A 04/09/2016   Procedure: FISTULA REPAIR WITH  MUSCLE ROTATION FLAP;  Surgeon: Izora Gala, MD;  Location: [place];  Service: ENT;  Laterality: N/A;  . TRACHEOESOPHAGEAL FISTULA  REPAIR N/A 07/27/2016   Procedure: CLOSURE OF FISTULA;  Surgeon: Izora Gala, MD;  Location: [place];  Service: ENT;  Laterality: N/A;  . TYMPANOPLASTY  1967   "? side"   Social History   Occupational History  . Occupation: retired Nutritional therapist   Tobacco Use  . Smoking status: Never Smoker  . Smokeless tobacco: Never Used  Substance and Sexual Activity  . Alcohol use: Yes    Alcohol/week: 0.0 standard drinks    Comment: rare  . Drug use: No  . Sexual activity: Never

## 2018-12-27 ENCOUNTER — Emergency Department (HOSPITAL_COMMUNITY): Payer: Medicare Other

## 2018-12-27 ENCOUNTER — Inpatient Hospital Stay (HOSPITAL_COMMUNITY)
Admission: EM | Admit: 2018-12-27 | Discharge: 2019-01-09 | DRG: 871 | Disposition: E | Payer: Medicare Other | Source: Skilled Nursing Facility | Attending: Internal Medicine | Admitting: Internal Medicine

## 2018-12-27 DIAGNOSIS — Z807 Family history of other malignant neoplasms of lymphoid, hematopoietic and related tissues: Secondary | ICD-10-CM

## 2018-12-27 DIAGNOSIS — R69 Illness, unspecified: Secondary | ICD-10-CM

## 2018-12-27 DIAGNOSIS — A4189 Other specified sepsis: Principal | ICD-10-CM | POA: Diagnosis present

## 2018-12-27 DIAGNOSIS — Z9002 Acquired absence of larynx: Secondary | ICD-10-CM

## 2018-12-27 DIAGNOSIS — I441 Atrioventricular block, second degree: Secondary | ICD-10-CM | POA: Diagnosis not present

## 2018-12-27 DIAGNOSIS — Z66 Do not resuscitate: Secondary | ICD-10-CM | POA: Diagnosis present

## 2018-12-27 DIAGNOSIS — J9621 Acute and chronic respiratory failure with hypoxia: Secondary | ICD-10-CM | POA: Diagnosis not present

## 2018-12-27 DIAGNOSIS — I5022 Chronic systolic (congestive) heart failure: Secondary | ICD-10-CM | POA: Diagnosis not present

## 2018-12-27 DIAGNOSIS — I2510 Atherosclerotic heart disease of native coronary artery without angina pectoris: Secondary | ICD-10-CM | POA: Diagnosis present

## 2018-12-27 DIAGNOSIS — I351 Nonrheumatic aortic (valve) insufficiency: Secondary | ICD-10-CM | POA: Diagnosis present

## 2018-12-27 DIAGNOSIS — Z85038 Personal history of other malignant neoplasm of large intestine: Secondary | ICD-10-CM | POA: Diagnosis not present

## 2018-12-27 DIAGNOSIS — E861 Hypovolemia: Secondary | ICD-10-CM | POA: Diagnosis present

## 2018-12-27 DIAGNOSIS — J1289 Other viral pneumonia: Secondary | ICD-10-CM | POA: Diagnosis not present

## 2018-12-27 DIAGNOSIS — Z8673 Personal history of transient ischemic attack (TIA), and cerebral infarction without residual deficits: Secondary | ICD-10-CM | POA: Diagnosis not present

## 2018-12-27 DIAGNOSIS — Z8521 Personal history of malignant neoplasm of larynx: Secondary | ICD-10-CM

## 2018-12-27 DIAGNOSIS — N179 Acute kidney failure, unspecified: Secondary | ICD-10-CM | POA: Diagnosis not present

## 2018-12-27 DIAGNOSIS — I420 Dilated cardiomyopathy: Secondary | ICD-10-CM | POA: Diagnosis present

## 2018-12-27 DIAGNOSIS — U071 COVID-19: Secondary | ICD-10-CM | POA: Diagnosis present

## 2018-12-27 DIAGNOSIS — H409 Unspecified glaucoma: Secondary | ICD-10-CM | POA: Diagnosis present

## 2018-12-27 DIAGNOSIS — I480 Paroxysmal atrial fibrillation: Secondary | ICD-10-CM | POA: Diagnosis present

## 2018-12-27 DIAGNOSIS — Z96651 Presence of right artificial knee joint: Secondary | ICD-10-CM | POA: Diagnosis present

## 2018-12-27 DIAGNOSIS — N183 Chronic kidney disease, stage 3 unspecified: Secondary | ICD-10-CM

## 2018-12-27 DIAGNOSIS — Z96642 Presence of left artificial hip joint: Secondary | ICD-10-CM | POA: Diagnosis present

## 2018-12-27 DIAGNOSIS — J1282 Pneumonia due to coronavirus disease 2019: Secondary | ICD-10-CM

## 2018-12-27 DIAGNOSIS — R918 Other nonspecific abnormal finding of lung field: Secondary | ICD-10-CM | POA: Diagnosis not present

## 2018-12-27 DIAGNOSIS — M1990 Unspecified osteoarthritis, unspecified site: Secondary | ICD-10-CM | POA: Diagnosis not present

## 2018-12-27 DIAGNOSIS — R0600 Dyspnea, unspecified: Secondary | ICD-10-CM

## 2018-12-27 DIAGNOSIS — Z974 Presence of external hearing-aid: Secondary | ICD-10-CM

## 2018-12-27 DIAGNOSIS — F0390 Unspecified dementia without behavioral disturbance: Secondary | ICD-10-CM | POA: Diagnosis present

## 2018-12-27 DIAGNOSIS — I10 Essential (primary) hypertension: Secondary | ICD-10-CM | POA: Diagnosis present

## 2018-12-27 DIAGNOSIS — E785 Hyperlipidemia, unspecified: Secondary | ICD-10-CM | POA: Diagnosis not present

## 2018-12-27 DIAGNOSIS — I712 Thoracic aortic aneurysm, without rupture: Secondary | ICD-10-CM | POA: Diagnosis present

## 2018-12-27 DIAGNOSIS — I130 Hypertensive heart and chronic kidney disease with heart failure and stage 1 through stage 4 chronic kidney disease, or unspecified chronic kidney disease: Secondary | ICD-10-CM | POA: Diagnosis present

## 2018-12-27 DIAGNOSIS — J181 Lobar pneumonia, unspecified organism: Secondary | ICD-10-CM | POA: Diagnosis not present

## 2018-12-27 DIAGNOSIS — Z8546 Personal history of malignant neoplasm of prostate: Secondary | ICD-10-CM

## 2018-12-27 DIAGNOSIS — R042 Hemoptysis: Secondary | ICD-10-CM | POA: Diagnosis present

## 2018-12-27 DIAGNOSIS — Z888 Allergy status to other drugs, medicaments and biological substances status: Secondary | ICD-10-CM

## 2018-12-27 DIAGNOSIS — I482 Chronic atrial fibrillation, unspecified: Secondary | ICD-10-CM | POA: Diagnosis present

## 2018-12-27 DIAGNOSIS — I959 Hypotension, unspecified: Secondary | ICD-10-CM | POA: Diagnosis not present

## 2018-12-27 DIAGNOSIS — D696 Thrombocytopenia, unspecified: Secondary | ICD-10-CM | POA: Diagnosis not present

## 2018-12-27 DIAGNOSIS — J189 Pneumonia, unspecified organism: Secondary | ICD-10-CM | POA: Diagnosis not present

## 2018-12-27 DIAGNOSIS — A419 Sepsis, unspecified organism: Secondary | ICD-10-CM

## 2018-12-27 DIAGNOSIS — Z930 Tracheostomy status: Secondary | ICD-10-CM

## 2018-12-27 DIAGNOSIS — I440 Atrioventricular block, first degree: Secondary | ICD-10-CM | POA: Diagnosis not present

## 2018-12-27 DIAGNOSIS — G479 Sleep disorder, unspecified: Secondary | ICD-10-CM | POA: Diagnosis present

## 2018-12-27 DIAGNOSIS — Z515 Encounter for palliative care: Secondary | ICD-10-CM | POA: Diagnosis not present

## 2018-12-27 DIAGNOSIS — Z9049 Acquired absence of other specified parts of digestive tract: Secondary | ICD-10-CM

## 2018-12-27 DIAGNOSIS — R0602 Shortness of breath: Secondary | ICD-10-CM | POA: Diagnosis not present

## 2018-12-27 DIAGNOSIS — I4510 Unspecified right bundle-branch block: Secondary | ICD-10-CM | POA: Diagnosis present

## 2018-12-27 DIAGNOSIS — Z7901 Long term (current) use of anticoagulants: Secondary | ICD-10-CM

## 2018-12-27 DIAGNOSIS — J188 Other pneumonia, unspecified organism: Secondary | ICD-10-CM

## 2018-12-27 LAB — COMPREHENSIVE METABOLIC PANEL
ALT: 11 U/L (ref 0–44)
AST: 27 U/L (ref 15–41)
Albumin: 2.4 g/dL — ABNORMAL LOW (ref 3.5–5.0)
Alkaline Phosphatase: 106 U/L (ref 38–126)
Anion gap: 11 (ref 5–15)
BUN: 23 mg/dL (ref 8–23)
CO2: 19 mmol/L — ABNORMAL LOW (ref 22–32)
Calcium: 7.6 mg/dL — ABNORMAL LOW (ref 8.9–10.3)
Chloride: 108 mmol/L (ref 98–111)
Creatinine, Ser: 1.54 mg/dL — ABNORMAL HIGH (ref 0.61–1.24)
GFR calc Af Amer: 45 mL/min — ABNORMAL LOW (ref >60)
GFR calc non Af Amer: 39 mL/min — ABNORMAL LOW (ref >60)
Glucose, Bld: 134 mg/dL — ABNORMAL HIGH (ref 70–99)
Potassium: 3.6 mmol/L (ref 3.5–5.1)
Sodium: 138 mmol/L (ref 135–145)
Total Bilirubin: 1.5 mg/dL — ABNORMAL HIGH (ref 0.3–1.2)
Total Protein: 5.3 g/dL — ABNORMAL LOW (ref 6.5–8.1)

## 2018-12-27 LAB — CBC WITH DIFFERENTIAL/PLATELET
Abs Immature Granulocytes: 0.02 10*3/uL (ref 0.00–0.07)
Basophils Absolute: 0 10*3/uL (ref 0.0–0.1)
Basophils Relative: 0 %
Eosinophils Absolute: 0 10*3/uL (ref 0.0–0.5)
Eosinophils Relative: 0 %
HCT: 40.3 % (ref 39.0–52.0)
Hemoglobin: 13.1 g/dL (ref 13.0–17.0)
Immature Granulocytes: 1 %
Lymphocytes Relative: 8 %
Lymphs Abs: 0.3 10*3/uL — ABNORMAL LOW (ref 0.7–4.0)
MCH: 31.9 pg (ref 26.0–34.0)
MCHC: 32.5 g/dL (ref 30.0–36.0)
MCV: 98.1 fL (ref 80.0–100.0)
Monocytes Absolute: 0.1 10*3/uL (ref 0.1–1.0)
Monocytes Relative: 3 %
Neutro Abs: 3.5 10*3/uL (ref 1.7–7.7)
Neutrophils Relative %: 88 %
Platelets: 95 10*3/uL — ABNORMAL LOW (ref 150–400)
RBC: 4.11 MIL/uL — ABNORMAL LOW (ref 4.22–5.81)
RDW: 14.9 % (ref 11.5–15.5)
WBC: 3.9 10*3/uL — ABNORMAL LOW (ref 4.0–10.5)
nRBC: 0 % (ref 0.0–0.2)

## 2018-12-27 LAB — POCT I-STAT EG7
Acid-base deficit: 4 mmol/L — ABNORMAL HIGH (ref 0.0–2.0)
Bicarbonate: 18.6 mmol/L — ABNORMAL LOW (ref 20.0–28.0)
Calcium, Ion: 1 mmol/L — ABNORMAL LOW (ref 1.15–1.40)
HCT: 37 % — ABNORMAL LOW (ref 39.0–52.0)
Hemoglobin: 12.6 g/dL — ABNORMAL LOW (ref 13.0–17.0)
O2 Saturation: 98 %
Potassium: 3.6 mmol/L (ref 3.5–5.1)
Sodium: 141 mmol/L (ref 135–145)
TCO2: 19 mmol/L — ABNORMAL LOW (ref 22–32)
pCO2, Ven: 27.9 mmHg — ABNORMAL LOW (ref 44.0–60.0)
pH, Ven: 7.433 — ABNORMAL HIGH (ref 7.250–7.430)
pO2, Ven: 92 mmHg — ABNORMAL HIGH (ref 32.0–45.0)

## 2018-12-27 LAB — BRAIN NATRIURETIC PEPTIDE: B Natriuretic Peptide: 3218.2 pg/mL — ABNORMAL HIGH (ref 0.0–100.0)

## 2018-12-27 LAB — FERRITIN: Ferritin: 726 ng/mL — ABNORMAL HIGH (ref 24–336)

## 2018-12-27 LAB — URINALYSIS, ROUTINE W REFLEX MICROSCOPIC
Bilirubin Urine: NEGATIVE
Glucose, UA: NEGATIVE mg/dL
Hgb urine dipstick: NEGATIVE
Ketones, ur: NEGATIVE mg/dL
Leukocytes,Ua: NEGATIVE
Nitrite: NEGATIVE
Protein, ur: NEGATIVE mg/dL
Specific Gravity, Urine: 1.012 (ref 1.005–1.030)
pH: 5 (ref 5.0–8.0)

## 2018-12-27 LAB — LIPASE, BLOOD: Lipase: 28 U/L (ref 11–51)

## 2018-12-27 LAB — LACTATE DEHYDROGENASE: LDH: 382 U/L — ABNORMAL HIGH (ref 98–192)

## 2018-12-27 LAB — LACTIC ACID, PLASMA: Lactic Acid, Venous: 3.3 mmol/L (ref 0.5–1.9)

## 2018-12-27 LAB — SARS CORONAVIRUS 2 BY RT PCR (HOSPITAL ORDER, PERFORMED IN ~~LOC~~ HOSPITAL LAB): SARS Coronavirus 2: POSITIVE — AB

## 2018-12-27 LAB — PROCALCITONIN: Procalcitonin: <0.1 ng/mL

## 2018-12-27 LAB — SEDIMENTATION RATE: Sed Rate: 7 mm/hr (ref 0–16)

## 2018-12-27 LAB — D-DIMER, QUANTITATIVE: D-Dimer, Quant: 1.8 ug/mL-FEU — ABNORMAL HIGH (ref 0.00–0.50)

## 2018-12-27 LAB — C-REACTIVE PROTEIN: CRP: 7.8 mg/dL — ABNORMAL HIGH (ref <1.0)

## 2018-12-27 MED ORDER — SODIUM CHLORIDE 0.9 % IV BOLUS
250.0000 mL | Freq: Once | INTRAVENOUS | Status: AC
  Administered 2018-12-27: 250 mL via INTRAVENOUS

## 2018-12-27 MED ORDER — VANCOMYCIN HCL 10 G IV SOLR
1500.0000 mg | Freq: Once | INTRAVENOUS | Status: AC
  Administered 2018-12-27: 1500 mg via INTRAVENOUS
  Filled 2018-12-27: qty 1500

## 2018-12-27 MED ORDER — APIXABAN 2.5 MG PO TABS
2.5000 mg | ORAL_TABLET | Freq: Two times a day (BID) | ORAL | Status: DC
  Filled 2018-12-27 (×2): qty 1

## 2018-12-27 MED ORDER — VITAMIN C 500 MG PO TABS
500.0000 mg | ORAL_TABLET | Freq: Every day | ORAL | Status: DC
  Administered 2018-12-28 – 2018-12-29 (×2): 500 mg via ORAL
  Filled 2018-12-27 (×6): qty 1

## 2018-12-27 MED ORDER — ATORVASTATIN CALCIUM 40 MG PO TABS
40.0000 mg | ORAL_TABLET | Freq: Every evening | ORAL | Status: DC
  Administered 2018-12-28 – 2018-12-30 (×3): 40 mg via ORAL
  Filled 2018-12-27 (×3): qty 1

## 2018-12-27 MED ORDER — ACETAMINOPHEN 325 MG PO TABS: 650.0000 mg | ORAL_TABLET | Freq: Four times a day (QID) | ORAL | Status: DC | PRN

## 2018-12-27 MED ORDER — VANCOMYCIN HCL IN DEXTROSE 1-5 GM/200ML-% IV SOLN: 1000.0000 mg | Freq: Once | INTRAVENOUS | Status: DC

## 2018-12-27 MED ORDER — SODIUM CHLORIDE 0.9 % IV BOLUS
500.0000 mL | Freq: Once | INTRAVENOUS | Status: AC
  Administered 2018-12-27: 500 mL via INTRAVENOUS

## 2018-12-27 MED ORDER — SODIUM CHLORIDE 0.9 % IV SOLN
2.0000 g | Freq: Once | INTRAVENOUS | Status: AC
  Administered 2018-12-27: 2 g via INTRAVENOUS
  Filled 2018-12-27: qty 2

## 2018-12-27 MED ORDER — IPRATROPIUM-ALBUTEROL 20-100 MCG/ACT IN AERS
1.0000 | INHALATION_SPRAY | Freq: Four times a day (QID) | RESPIRATORY_TRACT | Status: DC
  Filled 2018-12-27 (×2): qty 4

## 2018-12-27 MED ORDER — VANCOMYCIN HCL 10 G IV SOLR: 1500.0000 mg | INTRAVENOUS | Status: DC

## 2018-12-27 MED ORDER — SODIUM CHLORIDE 0.9% FLUSH
3.0000 mL | Freq: Two times a day (BID) | INTRAVENOUS | Status: DC
  Administered 2018-12-28 – 2019-01-03 (×13): 3 mL via INTRAVENOUS

## 2018-12-27 MED ORDER — HYDROCOD POLST-CPM POLST ER 10-8 MG/5ML PO SUER: 5.0000 mL | Freq: Two times a day (BID) | ORAL | Status: DC | PRN

## 2018-12-27 MED ORDER — SODIUM CHLORIDE 0.9 % IV SOLN
2.0000 g | Freq: Two times a day (BID) | INTRAVENOUS | Status: DC
  Administered 2018-12-28: 2 g via INTRAVENOUS
  Filled 2018-12-27: qty 2

## 2018-12-27 MED ORDER — ZINC SULFATE 220 (50 ZN) MG PO CAPS
220.0000 mg | ORAL_CAPSULE | Freq: Every day | ORAL | Status: DC
  Administered 2018-12-28 – 2018-12-29 (×2): 220 mg via ORAL
  Filled 2018-12-27 (×6): qty 1

## 2018-12-27 MED ORDER — GUAIFENESIN-DM 100-10 MG/5ML PO SYRP: 10.0000 mL | ORAL_SOLUTION | ORAL | Status: DC | PRN

## 2018-12-27 NOTE — H&P (Signed)
History and Physical    TARVIS BLOSSOM QQI:[id number] DOB: 15-Jan-1925 DOA: 12/26/2018  PCP: Gayland Curry, DO  Patient coming from: [place]  I have personally briefly reviewed patient's old medical records in [place]  Chief Complaint: Dyspnea  HPI: SIMS LADAY is a 85 y.o. male with medical history significant for CAD, hypertension, hyperlipidemia, atrial fibrillation status post maze on Eliquis, chronic systolic CHF (EF 94-17%), CKD stage III, mitral valve prolapse with severe mitral regurgitation, history of colon cancer, history of vocal cord cancer status post laryngectomy requiring use of electrolarynx, thrombocytopenia, RBBB, and second-degree type I AV block was brought to the ED from his skilled nursing facility for worsening shortness of breath.  History is limited from patient due to lethargy and nonverbal status therefore entirety of history is obtained from EDP, chart review, and patient's daughter.  Patient was recently admitted from 11/16/2018-11/23/2018 for a left femoral neck fracture and underwent prosthetic replacement on 11/18/2018.  Hospitalization was complicated by fluid overload which improved with diuresis.  He was subsequently discharged to [facility] skilled nursing facility.  Per daughter he has been quarantine at the nursing facility for 30 days.  He has apparently had multiple negative coronavirus test at his nursing facility.  He reportedly was recently diagnosed with pneumonia and started on oral antibiotics, which appears to be Levaquin.  He was sent to the ED due to worsening dyspnea.  ED Course:  Initial vitals showed BP 82/52, pulse 69, RR 29, temp 98.5 Fahrenheit, SPO2 96% on room air.  Labs are notable for WBC 3.9, hemoglobin 13.1, platelets 95,000, sodium 138, potassium 3.6, BUN 23, creatinine 1.54, GFR 39, bicarb 19, BNP 3218.2, lactic acid 3.3, lipase 28, procalcitonin less than 0.10, urinalysis negative for UTI.  Blood cultures were  drawn and pending.  VBG showed pH 7.433, PCO2 27.9, PO2 92.  SARS-CoV-2 test is positive.  Portable chest x-ray shows multifocal bilateral groundglass opacities.  Patient was started on IV vancomycin and cefepime and given to 50 mL's of normal saline.  The hospitalist service was consulted admit for further evaluation and management.   Review of Systems:  Unable to be obtained due to patient lethargy and inability to verbally communicate without electro larynx.   Past Medical History:  Diagnosis Date  . Arthritis   . Cerebral ischemia   . CKD (chronic kidney disease), stage III ([place])   . Colon cancer [facility])     s/p partial colectomy  . Complication of anesthesia    "woke up w/confusion and hallucinations once after mitral valve OR"  . Coronary artery disease    a. pre-surgical cath 2010 showed 50-60% of the LAD with negative nuclear stress test at that time. b. normal nuc 2016.  . First degree AV block   . Glaucoma   . Hernia   . HTN (hypertension)    takes Amlodipine daily  . Hyperlipidemia   . Major depression   . Mild cognitive impairment   . Mitral regurgitation    a. mild-mod MR by echo 08/2017.  . Mobitz type 1 second degree atrioventricular block   . Mobitz type 2 second degree atrioventricular block   . Moderate aortic insufficiency   . MVP (mitral valve prolapse)    S/P Rt mini thoractomy for Mitral Valve repair  . Paroxysmal atrial fibrillation (HCC)    S/P Maze procedure  . 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 Premature atrial contractions   .  Prostate cancer ([place])   . PVC's (premature ventricular contractions)   . RBBB   . 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 Sleep disturbance   . Thoracic aortic aneurysm ([place])    a. last echo 08/2017: mildly dilated aortic root.  . Thrombocytopenia ([place])   . Wandering atrial pacemaker     Past Surgical History:  Procedure Laterality Date  .  CATARACT EXTRACTION W/ INTRAOCULAR LENS  IMPLANT, BILATERAL Bilateral   . COLECTOMY  2004   Dr [name]  . DIRECT LARYNGOSCOPY N/A 01/15/2016   Procedure: DIRECT LARYNGOSCOPY WITH BIOPSY AND FROZEN SECTION;  Surgeon: Izora Gala, MD;  Location: [place];  Service: ENT;  Laterality: N/A;  . FlexHD patch repair of chest wall hernia.  01/28/2011   [name]  . GASTROSTOMY W/ FEEDING TUBE    . IR GENERIC HISTORICAL  10/08/2016   IR GASTROSTOMY TUBE REMOVAL 10/08/2016 Sandi Mariscal, MD [facility]-INTERV RAD  . JOINT REPLACEMENT    . LARYNGETOMY N/A 01/15/2016   Procedure:  TOTAL LARYNGECTOMY;  Surgeon: Izora Gala, MD;  Location: [facility];  Service: ENT;  Laterality: N/A;  . MAZE  12/27/2008   left side lesion set  . MICROLARYNGOSCOPY Right 01/29/2015   Procedure: MICROLARYNGOSCOPY WITH BIOSPY OF RIGHT VOCAL CORD;  Surgeon: Izora Gala, MD;  Location: [place];  Service: ENT;  Laterality: Right;  . MITRAL VALVE REPAIR  12/27/2008   complex valvuloplasty with 19mm Memo 3D annuloplasty ring via right minithoracotomy  . PECTORALIS FLAP Left 05/04/2016   Procedure: PECTORALIS FLAP to neck with possible, split thickness skin graft;  Surgeon: Loel Lofty Dillingham, DO;  Location: [place];  Service: Plastics;  Laterality: Left;  . PROSTATE BIOPSY    . SKIN SPLIT GRAFT Left 05/04/2016   Procedure: PECTORALIS MAJOR MYOCUTANEOUS FLAP RECONSTRUCTION OF PHARYNX AND SPLIT THICKNESS SKIN GRAFT;  Surgeon: Izora Gala, MD;  Location: [place];  Service: ENT;  Laterality: Left;  . TEE WITHOUT CARDIOVERSION N/A 01/03/2013   Procedure: TRANSESOPHAGEAL ECHOCARDIOGRAM (TEE);  Surgeon: Sueanne Margarita, MD;  Location: [place];  Service: Cardiovascular;  Laterality: N/A;  . TOTAL HIP ARTHROPLASTY Left 11/18/2018   Procedure: PATIAL HIP ARTHROPLASTY ANTERIOR APPROACH;  Surgeon: Leandrew Koyanagi, MD;  Location: [place];  Service: Orthopedics;  Laterality: Left;  . TOTAL KNEE ARTHROPLASTY Right 2003  . TRACHEAL ESOPHAGEAL PUNCTURE REPAIR N/A 03/26/2016   Procedure:  TRACHEAL ESOPHAGEAL PUNCTURE;  Surgeon: Izora Gala, MD;  Location: [facility] OR;  Service: ENT;  Laterality: N/A;  . TRACHEOESOPHAGEAL FISTULA REPAIR N/A 03/26/2016   Procedure: TRACHEO-ESOPHAGEAL   PUNCTURE,FISTULAR CLOSURE;  Surgeon: Izora Gala, MD;  Location: [place];  Service: ENT;  Laterality: N/A;  . TRACHEOESOPHAGEAL FISTULA REPAIR N/A 04/09/2016   Procedure: FISTULA REPAIR WITH  MUSCLE ROTATION FLAP;  Surgeon: Izora Gala, MD;  Location: [place];  Service: ENT;  Laterality: N/A;  . TRACHEOESOPHAGEAL FISTULA REPAIR N/A 07/27/2016   Procedure: CLOSURE OF FISTULA;  Surgeon: Izora Gala, MD;  Location: [place];  Service: ENT;  Laterality: N/A;  . TYMPANOPLASTY  1967   "? side"    Social History:  reports that he has never smoked. He has never used smokeless tobacco. He reports current alcohol use. He reports that he does not use drugs.  Allergies  Allergen Reactions  . Metoprolol Other (See Comments)    Hypersensitive to beta blockers with associated hypotension and bradycardia  . Citalopram Nausea Only  . Trazodone And Nefazodone Other (See  Comments)    Dry mouth  . Zoloft [Sertraline Hcl] Other (See Comments)    dizzy    Family History  Problem Relation Age of Onset  . Cancer Mother        lymphoma  . Cancer Father        pancreatic  . Heart attack Neg Hx   . Stroke Neg Hx      Prior to Admission medications   Medication Sig Start Date End Date Taking? Authorizing Provider  atorvastatin (LIPITOR) 40 MG tablet TAKE ONE TABLET BY MOUTH DAILY Patient taking differently: Take 40 mg by mouth every evening.  09/16/18  Yes Reed, Tiffany L, DO  cephALEXin (KEFLEX) 500 MG capsule Take one tab qid x 2 weeks Patient taking differently: Take 500 mg by mouth 4 (four) times daily. For 2 weeks started on May 13 End Date May 28 12/21/18  Yes Stanbery, Mary L, PA-C  ELIQUIS 5 MG TABS tablet TAKE ONE TABLET BY MOUTH TWICE A DAY Patient taking differently: Take 5 mg by mouth 2 (two) times daily.   01/14/18  Yes Turner, Eber Hong, MD  furosemide (LASIX) 40 MG tablet Take 40 mg by mouth daily.   Yes [provider]  levofloxacin (LEVAQUIN) 500 MG tablet Take 500 mg by mouth daily. For 3 days for pneumonia 12/26/18 12/29/18 Yes [provider]  Lidocaine (ASPERCREME LIDOCAINE) 4 % PTCH Apply 1 patch topically daily. To left hip   Yes [provider]  Melatonin 1 MG TABS Take 1 mg by mouth at bedtime.   Yes [provider]  ROCKLATAN 0.02-0.005 % SOLN Place 1 drop into both eyes at bedtime.  08/21/18  Yes [provider]  furosemide (LASIX) 20 MG tablet Take 1 tablet (20 mg total) by mouth daily. Patient not taking: Reported on 12/24/2018 11/28/18   Hollace Kinnier L, DO  HYDROcodone-acetaminophen (NORCO) 5-325 MG tablet Take 1-2 tablets by mouth every 6 (six) hours as needed. Patient not taking: Reported on 12/31/2018 11/18/18   Leandrew Koyanagi, MD  zolpidem (AMBIEN) 5 MG tablet TAKE ONE TABLET BY MOUTH DAILY AS NEEDED FOR SLEEP Patient not taking: No sig reported 10/03/18   Gayland Curry, DO    Physical Exam: Vitals:   12/19/2018 1830 01/06/2019 1845 01/05/2019 1900 12/28/2018 1945  BP: (!) 91/48 107/60 (!) 97/50 (!) 95/47  Pulse: (!) 57 (!) 49 79 (!) 50  Resp: (!) 24 (!) 27 (!) 21 (!) 21  Temp:      TempSrc:      SpO2: 97% 98% 96% 95%  Weight:      Height:        Constitutional: Elderly man resting supine in bed, lethargic but intermittently awakens to voice and light stimulus.  Occasionally nods head yes/no otherwise nonverbal. Eyes: PERRL, lids and conjunctivae normal ENMT: Mucous membranes are moist. Posterior pharynx clear of any exudate or lesions.Normal dentition.  Neck: Status post tracheostomy with dried blood in stoma Respiratory: Intermittent tachypnea, basilar crackles anteriorly Cardiovascular: Regular rate and rhythm. 2+ pedal pulses. Abdomen: no tenderness, no masses palpated. No hepatosplenomegaly. Bowel sounds positive.  GU: Condom cath  in place Musculoskeletal: no clubbing / cyanosis. No joint deformity upper and lower extremities.  Moving all extremities spontaneously. Skin: no rashes, lesions, ulcers. No induration Neurologic: Exam limited due to lethargy, intermittently awakens to light stimulus and moves all extremities spontaneously but not following commands. Psychiatric: Lethargic, intermittently awakens to light stimulus otherwise nonverbal and not following commands.  Labs on Admission: I have personally reviewed following labs and imaging studies  CBC: Recent Labs  Lab 01/02/2019 1631 12/25/2018 1652  WBC 3.9*  --   NEUTROABS 3.5  --   HGB 13.1 12.6*  HCT 40.3 37.0*  MCV 98.1  --   PLT 95*  --    Basic Metabolic Panel: Recent Labs  Lab 12/13/2018 1631 12/28/2018 1652  NA 138 141  K 3.6 3.6  CL 108  --   CO2 19*  --   GLUCOSE 134*  --   BUN 23  --   CREATININE 1.54*  --   CALCIUM 7.6*  --    GFR: Estimated Creatinine Clearance: 30.9 mL/min (A) (by C-G formula based on SCr of 1.54 mg/dL (H)). Liver Function Tests: Recent Labs  Lab 12/26/2018 1631  AST 27  ALT 11  ALKPHOS 106  BILITOT 1.5*  PROT 5.3*  ALBUMIN 2.4*   Recent Labs  Lab 12/16/2018 1631  LIPASE 28   No results for input(s): AMMONIA in the last 168 hours. Coagulation Profile: No results for input(s): INR, PROTIME in the last 168 hours. Cardiac Enzymes: No results for input(s): CKTOTAL, CKMB, CKMBINDEX, TROPONINI in the last 168 hours. BNP (last 3 results) No results for input(s): PROBNP in the last 8760 hours. HbA1C: No results for input(s): HGBA1C in the last 72 hours. CBG: No results for input(s): GLUCAP in the last 168 hours. Lipid Profile: No results for input(s): CHOL, HDL, LDLCALC, TRIG, CHOLHDL, LDLDIRECT in the last 72 hours. Thyroid Function Tests: No results for input(s): TSH, T4TOTAL, FREET4, T3FREE, THYROIDAB in the last 72 hours. Anemia Panel: No results for input(s): VITAMINB12, FOLATE, FERRITIN, TIBC,  IRON, RETICCTPCT in the last 72 hours. Urine analysis:    Component Value Date/Time   COLORURINE YELLOW 12/22/2018 [place] 01/02/2019 1851   LABSPEC 1.012 12/15/2018 1851   PHURINE 5.0 01/02/2019 1851   GLUCOSEU NEGATIVE 12/22/2018 1851   HGBUR NEGATIVE 01/02/2019 [place] 12/21/2018 1851   [place] 12/09/2018 1851   PROTEINUR NEGATIVE 01/01/2019 1851   UROBILINOGEN 0.2 01/26/2011 1220   NITRITE NEGATIVE 01/07/2019 [place] 12/14/2018 1851    Radiological Exams on Admission: Dg Chest Port 1 View  Result Date: 01/01/2019 CLINICAL DATA:  Mental status change EXAM: PORTABLE CHEST 1 VIEW COMPARISON:  11/21/2018, 11/16/2018 FINDINGS: Cardiomegaly with small left greater than right pleural effusions. Multifocal bilateral ground-glass opacity with consolidative process at the left base. Aortic atherosclerosis. No pneumothorax. IMPRESSION: 1. Development of multifocal bilateral ground-glass opacity, suspicious for pneumonia, could consider atypical or viral pneumonia in the appropriate clinical setting. 2. Cardiomegaly with suspected small pleural effusion. Persistent dense consolidation at the left lung base. Electronically Signed   By: Donavan Foil M.D.   On: 12/13/2018 17:07    EKG: Independently reviewed.  Sinus rhythm, Mobitz type II AV block, RBBB no significant change from prior.  Assessment/Plan Principal Problem:   COVID-19 virus infection Active Problems:   Paroxysmal atrial fibrillation (HCC)   HTN (hypertension)   Coronary artery disease   S/P laryngectomy   Chronic systolic congestive heart failure (HCC)   Thrombocytopenia (HCC)   Multifocal pneumonia   CKD (chronic kidney disease) stage 3, GFR 30-59 ml/min (HCC)  VERDUN RACKLEY is a 85 y.o. male with medical history significant for CAD, hypertension, hyperlipidemia, atrial fibrillation status post maze on Eliquis, chronic systolic CHF (EF 03-50%), CKD  stage III, mitral valve prolapse with severe mitral  regurgitation, history of colon cancer, history of vocal cord cancer status post laryngectomy requiring use of electrolarynx, thrombocytopenia, RBBB, and second-degree type I AV block was admitted with sepsis due to COVID-19 virus infection.   Sepsis due to COVID-19 virus infection with multifocal pneumonia: Patient with recent hospitalization at [facility] and coming from [facility].  Currently oxygenating well on room air, however high risk for further decompensation. -Admit to [facility] progressive care bed -Continue broad-spectrum IV antibiotics for now, vancomycin and cefepime -Continue supplemental oxygen -Continue antitussives and vitamin C, zinc -Give additional 500 mL fluid bolus -Obtain baseline COVID-19 labs and monitor daily labs  Chronic systolic CHF EF 54-56%: Appears overall volume depleted.  Give additional 500 mL normal saline bolus and further fluids depending on response.  Hold home Lasix.  Monitor strict I/O's.  Paroxysmal atrial fibrillation on Eliquis: In sinus rhythm with type II AV block.  Rate currently controlled.  Reduce home Eliquis to 2.5 mg p.o. daily given renal function.  CKD stage III: Baseline creatinine appears to be between 1.5-1.6 over the last year.  Currently near his baseline.  Vocal cord cancer status post laryngectomy: Requires the use of an electropharynx communication.  Daughter will be able to bring to hospital tomorrow if able to use.  Goals of care: Given age and comorbidities, prognosis is grim.  I discussed the plan to transfer to [facility] for further management with patient's daughter.  We also discussed that if patient should decline further that we will transition to comfort care.  Daughter, Jackelyn Poling, understands and agrees and that patient's comfort is top priority. -Patient is DNR/DNI, not to be escalated to intensive care -Transition to comfort care if worsening  condition despite supportive care   DVT prophylaxis: Eliquis Code Status: DNR/DNI, confirmed with patient's daughter Jackelyn Poling by phone [phone].  If patient begins to decline further, begin comfort care measures per my conversation with Debbie. Family Communication: Discussed with patient's daughter Jackelyn Poling by phone Disposition Plan: Admit to [employer] called: None Admission status: Inpatient   Zada Finders MD [facility]  If 7PM-7AM, please contact night-coverage [email or web address]  12/17/2018, 8:24 PM

## 2018-12-27 NOTE — ED Notes (Signed)
Pt from [facility]

## 2018-12-27 NOTE — ED Notes (Signed)
Nurse navigator spoke with daughter Jackelyn Poling [phone] regarding plan of care and waiting test results. Explained ED doctor will call her back with results.

## 2018-12-27 NOTE — ED Notes (Signed)
EMT Note: Rectal temp. 99.5 RN notified.

## 2018-12-27 NOTE — ED Notes (Signed)
ED TO INPATIENT HANDOFF REPORT  ED Nurse Name and Phone #: [phone] Lucita Ferrara Name/Age/Gender Timothy Suarez 85 y.o. male Room/Bed: 025C/025C  Code Status   Code Status: Prior  Home/SNF/Other Skilled nursing facility Patient disoriented x4 Is this baseline? Yes   Triage Complete: Triage complete  Chief Complaint hypoxia  Triage Note Presents from SNF with reports of difficulty breathing.  Pt has been on abx for pneumonia prior.  Pt confused and alert to speech.  Only non-verbal responses for this RN   Allergies Allergies  Allergen Reactions  . Metoprolol Other (See Comments)    Hypersensitive to beta blockers with associated hypotension and bradycardia  . Citalopram Nausea Only  . Trazodone And Nefazodone Other (See Comments)    Dry mouth  . Zoloft [Sertraline Hcl] Other (See Comments)    dizzy    Level of Care/Admitting Diagnosis ED Disposition    ED Disposition Condition [facility] Area: [place] [[id number]]  Level of Care: Progressive [102]  Covid Evaluation: Confirmed COVID Positive  Isolation Risk Level: Low Risk/Droplet (Less than 4L [place] supplementation)  Diagnosis: COVID-19 virus infection [[id number]]  Admitting Physician: Lenore Cordia [[id number]]  Attending Physician: Lenore Cordia [[id number]]  Estimated length of stay: past midnight tomorrow  Certification:: I certify this patient will need inpatient services for at least 2 midnights  PT Class (Do Not Modify): Inpatient [101]  PT Acc Code (Do Not Modify): Private [1]       B Medical/Surgery History Past Medical History:  Diagnosis Date  . Arthritis   . Cerebral ischemia   . CKD (chronic kidney disease), stage III ([place])   . Colon cancer [facility])     s/p partial colectomy  . Complication of anesthesia    "woke up w/confusion and hallucinations once after mitral valve OR"  . Coronary artery disease    a. pre-surgical cath 2010 showed 50-60% of the LAD with  negative nuclear stress test at that time. b. normal nuc 2016.  . First degree AV block   . Glaucoma   . Hernia   . HTN (hypertension)    takes Amlodipine daily  . Hyperlipidemia   . Major depression   . Mild cognitive impairment   . Mitral regurgitation    a. mild-mod MR by echo 08/2017.  . Mobitz type 1 second degree atrioventricular block   . Mobitz type 2 second degree atrioventricular block   . Moderate aortic insufficiency   . MVP (mitral valve prolapse)    S/P Rt mini thoractomy for Mitral Valve repair  . Paroxysmal atrial fibrillation (HCC)    S/P Maze procedure  . 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 Premature atrial contractions   . Prostate cancer ([place])   . PVC's (premature ventricular contractions)   . RBBB   . 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 Sleep disturbance   . Thoracic aortic aneurysm ([place])    a. last echo 08/2017: mildly dilated aortic root.  . Thrombocytopenia ([place])   . Wandering atrial pacemaker    Past Surgical History:  Procedure Laterality Date  . CATARACT EXTRACTION W/ INTRAOCULAR LENS  IMPLANT, BILATERAL Bilateral   . COLECTOMY  2004   Dr [name]  . DIRECT LARYNGOSCOPY N/A 01/15/2016   Procedure: DIRECT LARYNGOSCOPY WITH BIOPSY AND FROZEN SECTION;  Surgeon: Izora Gala, MD;  Location: [place];  Service: ENT;  Laterality:  N/A;  . FlexHD patch repair of chest wall hernia.  01/28/2011   [name]  . GASTROSTOMY W/ FEEDING TUBE    . IR GENERIC HISTORICAL  10/08/2016   IR GASTROSTOMY TUBE REMOVAL 10/08/2016 Sandi Mariscal, MD [facility]-INTERV RAD  . JOINT REPLACEMENT    . LARYNGETOMY N/A 01/15/2016   Procedure:  TOTAL LARYNGECTOMY;  Surgeon: Izora Gala, MD;  Location[facility];  Service: ENT;  Laterality: N/A;  . MAZE  12/27/2008   left side lesion set  . MICROLARYNGOSCOPY Right 01/29/2015   Procedure: MICROLARYNGOSCOPY WITH BIOSPY OF RIGHT VOCAL CORD;  Surgeon: Izora Gala, MD;  Location: [place];   Service: ENT;  Laterality: Right;  . MITRAL VALVE REPAIR  12/27/2008   complex valvuloplasty with 91mm Memo 3D annuloplasty ring via right minithoracotomy  . PECTORALIS FLAP Left 05/04/2016   Procedure: PECTORALIS FLAP to neck with possible, split thickness skin graft;  Surgeon: Loel Lofty Dillingham, DO;  Location: [place];  Service: Plastics;  Laterality: Left;  . PROSTATE BIOPSY    . SKIN SPLIT GRAFT Left 05/04/2016   Procedure: PECTORALIS MAJOR MYOCUTANEOUS FLAP RECONSTRUCTION OF PHARYNX AND SPLIT THICKNESS SKIN GRAFT;  Surgeon: Izora Gala, MD;  Location: [place];  Service: ENT;  Laterality: Left;  . TEE WITHOUT CARDIOVERSION N/A 01/03/2013   Procedure: TRANSESOPHAGEAL ECHOCARDIOGRAM (TEE);  Surgeon: Sueanne Margarita, MD;  Location: [place];  Service: Cardiovascular;  Laterality: N/A;  . TOTAL HIP ARTHROPLASTY Left 11/18/2018   Procedure: PATIAL HIP ARTHROPLASTY ANTERIOR APPROACH;  Surgeon: Leandrew Koyanagi, MD;  Location: [place];  Service: Orthopedics;  Laterality: Left;  . TOTAL KNEE ARTHROPLASTY Right 2003  . TRACHEAL ESOPHAGEAL PUNCTURE REPAIR N/A 03/26/2016   Procedure: TRACHEAL ESOPHAGEAL PUNCTURE;  Surgeon: Izora Gala, MD;  Location: [place];  Service: ENT;  Laterality: N/A;  . TRACHEOESOPHAGEAL FISTULA REPAIR N/A 03/26/2016   Procedure: TRACHEO-ESOPHAGEAL   PUNCTURE,FISTULAR CLOSURE;  Surgeon: Izora Gala, MD;  Location: [place];  Service: ENT;  Laterality: N/A;  . TRACHEOESOPHAGEAL FISTULA REPAIR N/A 04/09/2016   Procedure: FISTULA REPAIR WITH  MUSCLE ROTATION FLAP;  Surgeon: Izora Gala, MD;  Location: [place];  Service: ENT;  Laterality: N/A;  . TRACHEOESOPHAGEAL FISTULA REPAIR N/A 07/27/2016   Procedure: CLOSURE OF FISTULA;  Surgeon: Izora Gala, MD;  Location: [facility] OR;  Service: ENT;  Laterality: N/A;  . TYMPANOPLASTY  1967   "? side"     A IV Location/Drains/Wounds Patient Lines/Drains/Airways Status   Active Line/Drains/Airways    Name:   Placement date:   Placement time:   Site:   Days:    Peripheral IV 12/31/2018 Right Forearm   12/30/2018    1735    Forearm   less than 1   Peripheral IV 01/02/2019 Left Antecubital   12/15/2018    1747    Antecubital   less than 1   Closed System Drain 1 Midline Neck Bulb (JP) 7 Fr.   04/09/16    0831    Neck   992   External Urinary Catheter   12/06/16    1420    -   751   Incision (Closed) 03/26/16 Neck Other (Comment)   03/26/16    1222     1006   Incision (Closed) 04/09/16 Neck Other (Comment)   04/09/16    0847     992   Incision (Closed) 05/04/16 Chest Left   05/04/16    1007     967   Incision (Closed) 07/27/16 Neck Other (Comment)  07/27/16    1057     883   Incision (Closed) 11/18/18 Hip Left   11/18/18    0846     39   Tracheostomy Other (Comment)   11/21/18    -    -   36   Wound / Incision (Open or Dehisced) Throat pharyngocutaneous fistula   -    -    Throat             Intake/Output Last 24 hours  Intake/Output Summary (Last 24 hours) at 01/04/2019 1949 Last data filed at 12/18/2018 1852 Gross per 24 hour  Intake 600 ml  Output -  Net 600 ml    Labs/Imaging Results for orders placed or performed during the hospital encounter of 01/04/2019 (from the past 48 hour(s))  Lactic acid, plasma     Status: Abnormal   Collection Time: 12/28/2018  4:31 PM  Result Value Ref Range   Lactic Acid, Venous 3.3 (HH) 0.5 - 1.9 mmol/L    Comment: CRITICAL RESULT CALLED TO, READ BACK BY AND VERIFIED WITH: M.GAGE RN 1714 12/14/2018 MCCORMICK K Performed at [facility], [address], [place], [place] [address]   Comprehensive metabolic panel     Status: Abnormal   Collection Time: 12/15/2018  4:31 PM  Result Value Ref Range   Sodium 138 135 - 145 mmol/L   Potassium 3.6 3.5 - 5.1 mmol/L   Chloride 108 98 - 111 mmol/L   CO2 19 (L) 22 - 32 mmol/L   Glucose, Bld 134 (H) 70 - 99 mg/dL   BUN 23 8 - 23 mg/dL   Creatinine, Ser 1.54 (H) 0.61 - 1.24 mg/dL   Calcium 7.6 (L) 8.9 - 10.3 mg/dL   Total Protein 5.3 (L) 6.5 - 8.1 g/dL   Albumin 2.4  (L) 3.5 - 5.0 g/dL   AST 27 15 - 41 U/L   ALT 11 0 - 44 U/L   Alkaline Phosphatase 106 38 - 126 U/L   Total Bilirubin 1.5 (H) 0.3 - 1.2 mg/dL   GFR calc non Af Amer 39 (L) >60 mL/min   GFR calc Af Amer 45 (L) >60 mL/min   Anion gap 11 5 - 15    Comment: Performed at [facility], [place] [address] [place], [place] [address]  CBC WITH DIFFERENTIAL     Status: Abnormal   Collection Time: 12/10/2018  4:31 PM  Result Value Ref Range   WBC 3.9 (L) 4.0 - 10.5 K/uL   RBC 4.11 (L) 4.22 - 5.81 MIL/uL   Hemoglobin 13.1 13.0 - 17.0 g/dL   HCT 40.3 39.0 - 52.0 %   MCV 98.1 80.0 - 100.0 fL   MCH 31.9 26.0 - 34.0 pg   MCHC 32.5 30.0 - 36.0 g/dL   RDW 14.9 11.5 - 15.5 %   Platelets 95 (L) 150 - 400 K/uL    Comment: REPEATED TO VERIFY PLATELET COUNT CONFIRMED BY SMEAR Immature Platelet Fraction may be clinically indicated, consider ordering this additional test [id number]    nRBC 0.0 0.0 - 0.2 %   Neutrophils Relative % 88 %   Neutro Abs 3.5 1.7 - 7.7 K/uL   Lymphocytes Relative 8 %   Lymphs Abs 0.3 (L) 0.7 - 4.0 K/uL   Monocytes Relative 3 %   Monocytes Absolute 0.1 0.1 - 1.0 K/uL   Eosinophils Relative 0 %   Eosinophils Absolute 0.0 0.0 - 0.5 K/uL   Basophils Relative 0 %  Basophils Absolute 0.0 0.0 - 0.1 K/uL   RBC Morphology BURR CELLS     Comment: Acanthocytes present   Immature Granulocytes 1 %   Abs Immature Granulocytes 0.02 0.00 - 0.07 K/uL    Comment: Performed at [facility], [place] [address], [place], [place] [address]  Brain natriuretic peptide     Status: Abnormal   Collection Time: 12/28/2018  4:31 PM  Result Value Ref Range   B Natriuretic Peptide 3,218.2 (H) 0.0 - 100.0 pg/mL    Comment: Performed at [facility] [address] [place], [place] [address]  Lipase, blood     Status: None   Collection Time: 12/17/2018  4:31 PM  Result Value Ref Range   Lipase 28 11 - 51 U/L    Comment: Performed at [facility], [place] [address], [place], [place]  [address]  Procalcitonin     Status: None   Collection Time: 12/17/2018  4:31 PM  Result Value Ref Range   Procalcitonin <0.10 ng/mL    Comment:        Interpretation: PCT (Procalcitonin) <= 0.5 ng/mL: Systemic infection (sepsis) is not likely. Local bacterial infection is possible. (NOTE)       Sepsis PCT Algorithm           Lower Respiratory Tract                                      Infection PCT Algorithm    ----------------------------     ----------------------------         PCT < 0.25 ng/mL                PCT < 0.10 ng/mL         Strongly encourage             Strongly discourage   discontinuation of antibiotics    initiation of antibiotics    ----------------------------     -----------------------------       PCT 0.25 - 0.50 ng/mL            PCT 0.10 - 0.25 ng/mL               OR       >80% decrease in PCT            Discourage initiation of                                            antibiotics      Encourage discontinuation           of antibiotics    ----------------------------     -----------------------------         PCT >= 0.50 ng/mL              PCT 0.26 - 0.50 ng/mL               AND        <80% decrease in PCT             Encourage initiation of  antibiotics       Encourage continuation           of antibiotics    ----------------------------     -----------------------------        PCT >= 0.50 ng/mL                  PCT > 0.50 ng/mL               AND         increase in PCT                  Strongly encourage                                      initiation of antibiotics    Strongly encourage escalation           of antibiotics                                     -----------------------------                                           PCT <= 0.25 ng/mL                                                 OR                                        > 80% decrease in PCT                                     Discontinue / Do not  initiate                                             antibiotics Performed at [facility], [address] [place], [place] [address]   POCT I-Stat EG7     Status: Abnormal   Collection Time: 12/09/2018  4:52 PM  Result Value Ref Range   pH, Ven 7.433 (H) 7.250 - 7.430   pCO2, Ven 27.9 (L) 44.0 - 60.0 mmHg   pO2, Ven 92.0 (H) 32.0 - 45.0 mmHg   Bicarbonate 18.6 (L) 20.0 - 28.0 mmol/L   TCO2 19 (L) 22 - 32 mmol/L   O2 Saturation 98.0 %   Acid-base deficit 4.0 (H) 0.0 - 2.0 mmol/L   Sodium 141 135 - 145 mmol/L   Potassium 3.6 3.5 - 5.1 mmol/L   Calcium, Ion 1.00 (L) 1.15 - 1.40 mmol/L   HCT 37.0 (L) 39.0 - 52.0 %   Hemoglobin 12.6 (L) 13.0 - 17.0 g/dL   Patient temperature HIDE    Collection site BRACHIAL ARTERY    Sample type VENOUS   SARS Coronavirus 2 (CEPHEID- Performed in [facility]  hospital lab), Hosp Order     Status: Abnormal   Collection Time: 12/19/2018  5:28 PM  Result Value Ref Range   SARS Coronavirus 2 POSITIVE (A) NEGATIVE    Comment: RESULT CALLED TO, READ BACK BY AND VERIFIED WITH: S Nijee Heatwole RN 12/30/2018 1906 JDW (NOTE) If result is NEGATIVE SARS-CoV-2 target nucleic acids are NOT DETECTED. The SARS-CoV-2 RNA is generally detectable in upper and lower  respiratory specimens during the acute phase of infection. The lowest  concentration of SARS-CoV-2 viral copies this assay can detect is 250  copies / mL. A negative result does not preclude SARS-CoV-2 infection  and should not be used as the sole basis for treatment or other  patient management decisions.  A negative result may occur with  improper specimen collection / handling, submission of specimen other  than nasopharyngeal swab, presence of viral mutation(s) within the  areas targeted by this assay, and inadequate number of viral copies  (<250 copies / mL). A negative result must be combined with clinical  observations, patient history, and epidemiological information. If result is  POSITIVE SARS-CoV-2 target nucleic acids are DETECTED. The  SARS-CoV-2 RNA is generally detectable in upper and lower  respiratory specimens during the acute phase of infection.  Positive  results are indicative of active infection with SARS-CoV-2.  Clinical  correlation with patient history and other diagnostic information is  necessary to determine patient infection status.  Positive results do  not rule out bacterial infection or co-infection with other viruses. If result is PRESUMPTIVE POSTIVE SARS-CoV-2 nucleic acids MAY BE PRESENT.   A presumptive positive result was obtained on the submitted specimen  and confirmed on repeat testing.  While 2019 novel coronavirus  (SARS-CoV-2) nucleic acids may be present in the submitted sample  additional confirmatory testing may be necessary for epidemiological  and / or clinical management purposes  to differentiate between  SARS-CoV-2 and other Sarbecovirus currently known to infect humans.  If clinically indicated additional testing with an alternate test  methodology [phone]) is ad vised. The SARS-CoV-2 RNA is generally  detectable in upper and lower respiratory specimens during the acute  phase of infection. The expected result is Negative. Fact Sheet for Patients:  StrictlyIdeas.no Fact Sheet for Healthcare Providers: BankingDealers.co.za This test is not yet approved or cleared by the [place] FDA and has been authorized for detection and/or diagnosis of SARS-CoV-2 by FDA under an Emergency Use Authorization (EUA).  This EUA will remain in effect (meaning this test can be used) for the duration of the COVID-19 declaration under Section 564(b)(1) of the Act, 21 U.S.C. section 360bbb-3(b)(1), unless the authorization is terminated or revoked sooner. Performed at [facility], [place] [address], [place], [place] [address]   Urinalysis, Routine w reflex microscopic     Status:  None   Collection Time: 01/02/2019  6:51 PM  Result Value Ref Range   Color, Urine YELLOW YELLOW   APPearance CLEAR CLEAR   Specific Gravity, Urine 1.012 1.005 - 1.030   pH 5.0 5.0 - 8.0   Glucose, UA NEGATIVE NEGATIVE mg/dL   Hgb urine dipstick NEGATIVE NEGATIVE   Bilirubin Urine NEGATIVE NEGATIVE   Ketones, ur NEGATIVE NEGATIVE mg/dL   Protein, ur NEGATIVE NEGATIVE mg/dL   Nitrite NEGATIVE NEGATIVE   Leukocytes,Ua NEGATIVE NEGATIVE    Comment: Performed at [facility] [address] [place], [place] [address]   Dg Chest Port 1 View  Result Date: 12/23/2018 CLINICAL DATA:  Mental  status change EXAM: PORTABLE CHEST 1 VIEW COMPARISON:  11/21/2018, 11/16/2018 FINDINGS: Cardiomegaly with small left greater than right pleural effusions. Multifocal bilateral ground-glass opacity with consolidative process at the left base. Aortic atherosclerosis. No pneumothorax. IMPRESSION: 1. Development of multifocal bilateral ground-glass opacity, suspicious for pneumonia, could consider atypical or viral pneumonia in the appropriate clinical setting. 2. Cardiomegaly with suspected small pleural effusion. Persistent dense consolidation at the left lung base. Electronically Signed   By: Donavan Foil M.D.   On: 12/22/2018 17:07    Pending Labs Unresulted Labs (From admission, onward)    Start     Ordered   12/26/2018 1631  Lactic acid, plasma  Now then every 2 hours,   STAT     12/17/2018 1631   12/29/2018 1631  Blood Culture (routine x 2)  BLOOD CULTURE X 2,   STAT     12/22/2018 1631          Vitals/Pain Today's Vitals   12/22/2018 1830 12/13/2018 1845 12/22/2018 1900 12/23/2018 1945  BP: (!) 91/48 107/60 (!) 97/50 (!) 95/47  Pulse: (!) 57 (!) 49 79 (!) 50  Resp: (!) 24 (!) 27 (!) 21 (!) 21  Temp:      TempSrc:      SpO2: 97% 98% 96% 95%  Weight:      Height:        Isolation Precautions Droplet and Contact precautions  Medications Medications  vancomycin (VANCOCIN) 1,500 mg in sodium  chloride 0.9 % 500 mL IVPB (has no administration in time range)  ceFEPIme (MAXIPIME) 2 g in sodium chloride 0.9 % 100 mL IVPB (has no administration in time range)  ceFEPIme (MAXIPIME) 2 g in sodium chloride 0.9 % 100 mL IVPB (0 g Intravenous Stopped 12/26/2018 1852)  vancomycin (VANCOCIN) 1,500 mg in sodium chloride 0.9 % 500 mL IVPB (1,500 mg Intravenous New Bag/Given 12/28/2018 1739)  sodium chloride 0.9 % bolus 250 mL (250 mLs Intravenous New Bag/Given 01/06/2019 1749)    Mobility non-ambulatory High fall risk   Focused Assessments Pulmonary Assessment Handoff:  Lung sounds:   O2 Device: Room Air        R Recommendations: See Admitting Provider Note  Report given to:   Additional Notes: N/A

## 2018-12-27 NOTE — ED Provider Notes (Addendum)
[facility] EMERGENCY DEPARTMENT Provider Note   CSN: [id number] Arrival date & time: 12/15/2018  1602    History   Chief Complaint No chief complaint on file.   HPI Timothy Suarez is a 85 y.o. male.     HPI  85 y.o.malewith medical history significant ofCKD stage III, nonobstructive CAD, chronic systolic CHF with dilated cardiomyopathy (EF 20 to 25%), second-degree AV block type I and II, mitral valve prolapse with severe mitral regurgitation status post repair, paroxysmal atrial fibrillation, thoracic aortic aneurysm, hypertension, hyperlipidemia, mild cognitive impairment, CVA, colon cancer, vocal cord cancer status post tracheostomy Patient transferred from SNF for report of increased difficulty breathing.  Reportedly, patient is taking antibiotics for prior diagnosis of pneumonia.  Very limited history obtained at this time.  Patient can awaken but seems quite hard of hearing and has difficulty giving historical answers to questions.  EMR review shows patient was discharged 4\15\20 after left femoral neck fracture repair with hemiarthroplasty by Dr. Erlinda Hong.  Patient was discharged to [facility].  I did talk to patient's daughter Miguel Dibble, she reports that he had had multiple coronavirus tests which have tested negative and he was in quarantine at [facility]. They Do not suspect coronavirus.   Past Medical History:  Diagnosis Date  . Arthritis   . Cerebral ischemia   . CKD (chronic kidney disease), stage III ([place])   . Colon cancer [facility])     s/p partial colectomy  . Complication of anesthesia    "woke up w/confusion and hallucinations once after mitral valve OR"  . Coronary artery disease    a. pre-surgical cath 2010 showed 50-60% of the LAD with negative nuclear stress test at that time. b. normal nuc 2016.  . First degree AV block   . Glaucoma   . Hernia   . HTN (hypertension)    takes Amlodipine daily  . Hyperlipidemia   . Major depression    . Mild cognitive impairment   . Mitral regurgitation    a. mild-mod MR by echo 08/2017.  . Mobitz type 1 second degree atrioventricular block   . Mobitz type 2 second degree atrioventricular block   . Moderate aortic insufficiency   . MVP (mitral valve prolapse)    S/P Rt mini thoractomy for Mitral Valve repair  . Paroxysmal atrial fibrillation (HCC)    S/P Maze procedure  . 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 Premature atrial contractions   . Prostate cancer ([place])   . PVC's (premature ventricular contractions)   . RBBB   . 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 Sleep disturbance   . Thoracic aortic aneurysm ([place])    a. last echo 08/2017: mildly dilated aortic root.  . Thrombocytopenia ([place])   . Wandering atrial pacemaker     Patient Active Problem List   Diagnosis Date Noted  . Closed displaced fracture of left femoral neck ([place]) 11/17/2018  . Fall 11/17/2018  . AKI (acute kidney injury) ([place]) 11/17/2018  . Oropharyngeal dysphagia 11/15/2017  . Alaryngeal voice 11/15/2017  . Cognitive deficits 09/06/2017  . Abnormal TSH 08/26/2017  . Thrombocytopenia ([place]) 08/26/2017  . Peripheral vascular disease ([place]) 08/26/2017  . Acute on chronic combined systolic and diastolic CHF (congestive heart failure) ([place]) 08/10/2017  . Hallucination 08/10/2017  . Balance problems 07/08/2017  . Baker cyst, left 07/08/2017  . Dilated cardiomyopathy ([place]) 04/19/2017  . Chronic systolic congestive  heart failure ([place]) 03/30/2017  . Left ventricular ejection fraction of 21% to 30% 12/29/2016  . Pulmonary edema 12/06/2016  . Presbycusis of both ears 05/25/2016  . Protein-calorie malnutrition, severe 02/26/2016  . Pharyngocutaneous fistula 02/21/2016  . S/P laryngectomy 01/15/2016  . History of laryngeal cancer 12/31/2015  . Other fatigue 12/09/2015  . Stage T1a Squamous Cell Carcinoma of the Right True Vocal Cord  01/30/2015  . Prostate cancer ([place]) 01/10/2015  . S/P MVR (mitral valve repair) 12/20/2014  . S/P Maze operation for atrial fibrillation 12/20/2014  . Obesity (BMI 30-39.9) 04/02/2014  . Prolonged Q-T interval on ECG 04/02/2014  . Mild cognitive impairment 04/02/2014  . Major depressive disorder, recurrent episode, mild ([place]) 02/08/2014  . Insomnia 02/08/2014  . Dizziness and giddiness 02/08/2014  . Generalized muscle weakness 02/08/2014  . Glaucoma 02/08/2014  . Colon cancer ([place])   . Coronary artery disease   . Moderate aortic insufficiency 01/03/2013  . Mild mitral regurgitation 01/03/2013  . Primary open angle glaucoma 12/03/2011  . MVP (mitral valve prolapse)   . Paroxysmal atrial fibrillation (HCC)   . HTN (hypertension)   . Depression   . chest wall hernia (lung hernia)     Past Surgical History:  Procedure Laterality Date  . CATARACT EXTRACTION W/ INTRAOCULAR LENS  IMPLANT, BILATERAL Bilateral   . COLECTOMY  2004   Dr [name]  . DIRECT LARYNGOSCOPY N/A 01/15/2016   Procedure: DIRECT LARYNGOSCOPY WITH BIOPSY AND FROZEN SECTION;  Surgeon: Izora Gala, MD;  Location: [place];  Service: ENT;  Laterality: N/A;  . FlexHD patch repair of chest wall hernia.  01/28/2011   [name]  . GASTROSTOMY W/ FEEDING TUBE    . IR GENERIC HISTORICAL  10/08/2016   IR GASTROSTOMY TUBE REMOVAL 10/08/2016 Sandi Mariscal, MD [facility]-INTERV RAD  . JOINT REPLACEMENT    . LARYNGETOMY N/A 01/15/2016   Procedure:  TOTAL LARYNGECTOMY;  Surgeon: Izora Gala, MD;  Location: [facility] OR;  Service: ENT;  Laterality: N/A;  . MAZE  12/27/2008   left side lesion set  . MICROLARYNGOSCOPY Right 01/29/2015   Procedure: MICROLARYNGOSCOPY WITH BIOSPY OF RIGHT VOCAL CORD;  Surgeon: Izora Gala, MD;  Location: [place];  Service: ENT;  Laterality: Right;  . MITRAL VALVE REPAIR  12/27/2008   complex valvuloplasty with 3mm Memo 3D annuloplasty ring via right minithoracotomy  . PECTORALIS FLAP Left 05/04/2016   Procedure: PECTORALIS FLAP to neck  with possible, split thickness skin graft;  Surgeon: Loel Lofty Dillingham, DO;  Location: [place];  Service: Plastics;  Laterality: Left;  . PROSTATE BIOPSY    . SKIN SPLIT GRAFT Left 05/04/2016   Procedure: PECTORALIS MAJOR MYOCUTANEOUS FLAP RECONSTRUCTION OF PHARYNX AND SPLIT THICKNESS SKIN GRAFT;  Surgeon: Izora Gala, MD;  Location: [place];  Service: ENT;  Laterality: Left;  . TEE WITHOUT CARDIOVERSION N/A 01/03/2013   Procedure: TRANSESOPHAGEAL ECHOCARDIOGRAM (TEE);  Surgeon: Sueanne Margarita, MD;  Location: [place];  Service: Cardiovascular;  Laterality: N/A;  . TOTAL HIP ARTHROPLASTY Left 11/18/2018   Procedure: PATIAL HIP ARTHROPLASTY ANTERIOR APPROACH;  Surgeon: Leandrew Koyanagi, MD;  Location: [place];  Service: Orthopedics;  Laterality: Left;  . TOTAL KNEE ARTHROPLASTY Right 2003  . TRACHEAL ESOPHAGEAL PUNCTURE REPAIR N/A 03/26/2016   Procedure: TRACHEAL ESOPHAGEAL PUNCTURE;  Surgeon: Izora Gala, MD;  Location: [place];  Service: ENT;  Laterality: N/A;  . TRACHEOESOPHAGEAL FISTULA REPAIR N/A 03/26/2016   Procedure: TRACHEO-ESOPHAGEAL   PUNCTURE,FISTULAR CLOSURE;  Surgeon: Izora Gala, MD;  Location: [place];  Service: ENT;  Laterality: N/A;  . TRACHEOESOPHAGEAL FISTULA REPAIR N/A 04/09/2016   Procedure: FISTULA REPAIR WITH  MUSCLE ROTATION FLAP;  Surgeon: Izora Gala, MD;  Location: [place];  Service: ENT;  Laterality: N/A;  . TRACHEOESOPHAGEAL FISTULA REPAIR N/A 07/27/2016   Procedure: CLOSURE OF FISTULA;  Surgeon: Izora Gala, MD;  Location: [place];  Service: ENT;  Laterality: N/A;  . TYMPANOPLASTY  1967   "? side"        Home Medications    Prior to Admission medications   Medication Sig Start Date End Date Taking? Authorizing Provider  atorvastatin (LIPITOR) 40 MG tablet TAKE ONE TABLET BY MOUTH DAILY Patient taking differently: Take 40 mg by mouth every evening.  09/16/18  Yes Reed, Tiffany L, DO  cephALEXin (KEFLEX) 500 MG capsule Take one tab qid x 2 weeks Patient taking differently:  Take 500 mg by mouth 4 (four) times daily. For 2 weeks started on May 13 End Date May 28 12/21/18  Yes Stanbery, Mary L, PA-C  ELIQUIS 5 MG TABS tablet TAKE ONE TABLET BY MOUTH TWICE A DAY Patient taking differently: Take 5 mg by mouth 2 (two) times daily.  01/14/18  Yes Turner, Eber Hong, MD  furosemide (LASIX) 40 MG tablet Take 40 mg by mouth daily.   Yes [provider]  levofloxacin (LEVAQUIN) 500 MG tablet Take 500 mg by mouth daily. For 3 days for pneumonia 12/26/18 12/29/18 Yes [provider]  Lidocaine (ASPERCREME LIDOCAINE) 4 % PTCH Apply 1 patch topically daily. To left hip   Yes [provider]  Melatonin 1 MG TABS Take 1 mg by mouth at bedtime.   Yes [provider]  ROCKLATAN 0.02-0.005 % SOLN Place 1 drop into both eyes at bedtime.  08/21/18  Yes [provider]  furosemide (LASIX) 20 MG tablet Take 1 tablet (20 mg total) by mouth daily. Patient not taking: Reported on 01/05/2019 11/28/18   Hollace Kinnier L, DO  HYDROcodone-acetaminophen (NORCO) 5-325 MG tablet Take 1-2 tablets by mouth every 6 (six) hours as needed. Patient not taking: Reported on 01/07/2019 11/18/18   Leandrew Koyanagi, MD  zolpidem (AMBIEN) 5 MG tablet TAKE ONE TABLET BY MOUTH DAILY AS NEEDED FOR SLEEP Patient not taking: No sig reported 10/03/18   Gayland Curry, DO    Family History Family History  Problem Relation Age of Onset  . Cancer Mother        lymphoma  . Cancer Father        pancreatic  . Heart attack Neg Hx   . Stroke Neg Hx     Social History Social History   Tobacco Use  . Smoking status: Never Smoker  . Smokeless tobacco: Never Used  Substance Use Topics  . Alcohol use: Yes    Alcohol/week: 0.0 standard drinks    Comment: rare  . Drug use: No     Allergies   Metoprolol; Citalopram; Trazodone and nefazodone; and Zoloft [sertraline hcl]   Review of Systems Review of Systems Level 5 caveat cannot obtain review of systems due to patient  condition/Dementia or HOH  Physical Exam Updated Vital Signs BP (!) 97/50   Pulse 79   Temp 99.5 F (37.5 C) (Rectal)   Resp (!) 21   Ht 5\' 6"  (1.676 m)   Wt 79 kg   SpO2 96%   BMI 28.11 kg/m   Physical Exam Constitutional:      Comments: Patient is resting quietly.  He does not  appear to be in distress.  Does appear slightly tachypneic.  He does arouse to light stimulus.  He appears alert and as though he would answer question but then just does not.  HENT:     Head: Normocephalic and atraumatic.     Mouth/Throat:     Mouth: Mucous membranes are moist.     Pharynx: Oropharynx is clear.  Eyes:     Extraocular Movements: Extraocular movements intact.  Neck:     Musculoskeletal: Neck supple.  Cardiovascular:     Rate and Rhythm: Normal rate and regular rhythm.  Pulmonary:     Comments: Mild tachypnea.  Patient has tracheostomy that has dried blood around it but does not have any fresh blood or any type of gurgling sound associated.  Lungs auscultated anteriorly.  Some crackle at the bases laterally. Abdominal:     Comments: Abdomen is soft.  No guarding.  No significant distention.  Genitourinary:    Penis: Normal.   Musculoskeletal:     Comments: Healing incision over left lateral hip.  Incision site is very clean dry without signs of secondary infection.  Patient has about 3+ pitting edema of the left lower extremity including the foot and lower leg.  About 2+ pitting edema of the right lower leg.  Feet are warm and dry.  radial pulses are strong 2+.  Skin:    General: Skin is warm and dry.  Neurological:     Comments: Patient is fairly inactive.  He will awaken to light stimulus.  Does not really follow commands.  No areas that suggest flaccid paralysis.      ED Treatments / Results  Labs (all labs ordered are listed, but only abnormal results are displayed) Labs Reviewed  SARS CORONAVIRUS 2 ([facility]) - Abnormal;  Notable for the following components:      Result Value   SARS Coronavirus 2 POSITIVE (*)    All other components within normal limits  LACTIC ACID, PLASMA - Abnormal; Notable for the following components:   Lactic Acid, Venous 3.3 (*)    All other components within normal limits  COMPREHENSIVE METABOLIC PANEL - Abnormal; Notable for the following components:   CO2 19 (*)    Glucose, Bld 134 (*)    Creatinine, Ser 1.54 (*)    Calcium 7.6 (*)    Total Protein 5.3 (*)    Albumin 2.4 (*)    Total Bilirubin 1.5 (*)    GFR calc non Af Amer 39 (*)    GFR calc Af Amer 45 (*)    All other components within normal limits  CBC WITH DIFFERENTIAL/PLATELET - Abnormal; Notable for the following components:   WBC 3.9 (*)    RBC 4.11 (*)    Platelets 95 (*)    Lymphs Abs 0.3 (*)    All other components within normal limits  BRAIN NATRIURETIC PEPTIDE - Abnormal; Notable for the following components:   B Natriuretic Peptide 3,218.2 (*)    All other components within normal limits  POCT I-STAT EG7 - Abnormal; Notable for the following components:   pH, Ven 7.433 (*)    pCO2, Ven 27.9 (*)    pO2, Ven 92.0 (*)    Bicarbonate 18.6 (*)    TCO2 19 (*)    Acid-base deficit 4.0 (*)    Calcium, Ion 1.00 (*)    HCT 37.0 (*)    Hemoglobin 12.6 (*)    All other components within normal  limits  CULTURE, BLOOD (ROUTINE X 2)  CULTURE, BLOOD (ROUTINE X 2)  URINALYSIS, ROUTINE W REFLEX MICROSCOPIC  LIPASE, BLOOD  PROCALCITONIN  LACTIC ACID, PLASMA  I-STAT VENOUS BLOOD GAS, ED    EKG EKG Interpretation  Date/Time:  Tuesday Dec 27 2018 16:02:22 EDT Ventricular Rate:  75 PR Interval:    QRS Duration: 177 QT Interval:  534 QTC Calculation: 597 R Axis:   134 Text Interpretation:  Sinus rhythm Atrial premature complexes Prolonged PR interval Consider right atrial enlargement Right bundle branch block old RBBB. no sig change from previous Confirmed by Charlesetta Shanks [phone]) on 12/25/2018 4:06:40 PM    Radiology Dg Chest Port 1 View  Result Date: 12/10/2018 CLINICAL DATA:  Mental status change EXAM: PORTABLE CHEST 1 VIEW COMPARISON:  11/21/2018, 11/16/2018 FINDINGS: Cardiomegaly with small left greater than right pleural effusions. Multifocal bilateral ground-glass opacity with consolidative process at the left base. Aortic atherosclerosis. No pneumothorax. IMPRESSION: 1. Development of multifocal bilateral ground-glass opacity, suspicious for pneumonia, could consider atypical or viral pneumonia in the appropriate clinical setting. 2. Cardiomegaly with suspected small pleural effusion. Persistent dense consolidation at the left lung base. Electronically Signed   By: Donavan Foil M.D.   On: 12/15/2018 17:07    Procedures Procedures (including critical care time) CRITICAL CARE Performed by: Charlesetta Shanks   Total critical care time: 30 minutes  Critical care time was exclusive of separately billable procedures and treating other patients.  Critical care was necessary to treat or prevent imminent or life-threatening deterioration.  Critical care was time spent personally by me on the following activities: development of treatment plan with patient and/or surrogate as well as nursing, discussions with consultants, evaluation of patient's response to treatment, examination of patient, obtaining history from patient or surrogate, ordering and performing treatments and interventions, ordering and review of laboratory studies, ordering and review of radiographic studies, pulse oximetry and re-evaluation of patient's condition. Medications Ordered in ED Medications  vancomycin (VANCOCIN) 1,500 mg in sodium chloride 0.9 % 500 mL IVPB (1,500 mg Intravenous New Bag/Given 12/10/2018 1739)  vancomycin (VANCOCIN) 1,500 mg in sodium chloride 0.9 % 500 mL IVPB (has no administration in time range)  ceFEPIme (MAXIPIME) 2 g in sodium chloride 0.9 % 100 mL IVPB (has no administration in time range)  ceFEPIme  (MAXIPIME) 2 g in sodium chloride 0.9 % 100 mL IVPB (0 g Intravenous Stopped 12/29/2018 1852)  sodium chloride 0.9 % bolus 250 mL (250 mLs Intravenous New Bag/Given 12/12/2018 1749)     Initial Impression / Assessment and Plan / ED Course  I have reviewed the triage vital signs and the nursing notes.  Pertinent labs & imaging results that were available during my care of the patient were reviewed by me and considered in my medical decision making (see chart for details).        Consult: Tried hospitalist for admission.  Patient presents as outlined above.  Suspicion is for pneumonia.  Chest x-ray shows areas of patchy infiltrate.  Patient does have leukocytosis and mild lactic acidosis.  He has severe underlying heart failure with EF 20 to 25% on last echo.  Patient has mild tachypnea but is not showing significant increased work of breathing at this time.  Fluid administered in small aliquots of 250 cc x 2 for hypotension.  She has responded to that with blood pressure improvement.  Biotics initiated.  Patient also has risk for coronavirus.  He does come from SNF and has had symptoms of pneumonia.  Coronavirus test has returned positive.  Patient's family called and updated.  Final Clinical Impressions(s) / ED Diagnoses   Final diagnoses:  Sepsis, due to unspecified organism, unspecified whether acute organ dysfunction present [facility])  Severe comorbid illness  Pneumonia due to COVID-19 virus    ED Discharge Orders    None       Charlesetta Shanks, MD 12/28/2018 Junious Dresser    Charlesetta Shanks, MD 12/26/2018 Doran Heater    Charlesetta Shanks, MD 01/02/2019 1929

## 2018-12-27 NOTE — ED Notes (Signed)
Report given to [facility]. All questions answered

## 2018-12-27 NOTE — ED Notes (Signed)
No gross changes noted.  Remains unresponsive to voice and pain.  No verbal responses, did withdraw with IV start.

## 2018-12-27 NOTE — ED Notes (Signed)
EMT Note: Condom cath placed on pt with new brief. Pt is one assist.

## 2018-12-27 NOTE — ED Triage Notes (Signed)
Presents from SNF with reports of difficulty breathing.  Pt has been on abx for pneumonia prior.  Pt confused and alert to speech.  Only non-verbal responses for this RN

## 2018-12-27 NOTE — Progress Notes (Signed)
Pharmacy Antibiotic Note  Timothy Suarez is a 85 y.o. male admitted on 01/01/2019 with pneumonia.  Pharmacy has been consulted for vancomycin and cefepime dosing.  Presenting from SNF with SOB, on cephalexin and levaquin prior to admit for PNA. WBC 3.9, LA 3.3, afebrile. Scr 1.54 (CrCl 30 mL/min).   Plan: Vancomycin 1500 mg IV every 48 hours  Cefepime 2g IV every 12 hours Monitor renal fx, cx results, clinical pic, and vanc levels as needed  Height: 5\' 6"  (167.6 cm) Weight: 174 lb 2.6 oz (79 kg) IBW/kg (Calculated) : 63.8  Temp (24hrs), Avg:98.5 F (36.9 C), Min:98.5 F (36.9 C), Max:98.5 F (36.9 C)  No results for input(s): WBC, CREATININE, LATICACIDVEN, VANCOTROUGH, VANCOPEAK, VANCORANDOM, GENTTROUGH, GENTPEAK, GENTRANDOM, TOBRATROUGH, TOBRAPEAK, TOBRARND, AMIKACINPEAK, AMIKACINTROU, AMIKACIN in the last 168 hours.  CrCl cannot be calculated (Patient's most recent lab result is older than the maximum 21 days allowed.).    Allergies  Allergen Reactions  . Metoprolol Other (See Comments)    Hypersensitive to beta blockers with associated hypotension and bradycardia  . Citalopram Nausea Only  . Trazodone And Nefazodone Other (See Comments)    Dry mouth  . Zoloft [Sertraline Hcl] Other (See Comments)    dizzy    Antimicrobials this admission: Vanc 5/19 >>  Cefepime 5/19 >>   Dose adjustments this admission: N/A  Microbiology results: 5/19 BCx: sent 5/19 COVID Cx: sent   Thank you for allowing pharmacy to be a part of this patient's care.  Antonietta Jewel, PharmD, [place] Clinical Pharmacist  Pager: [phone] Phone: [phone] 01/02/2019 4:38 PM

## 2018-12-28 ENCOUNTER — Inpatient Hospital Stay (HOSPITAL_COMMUNITY): Payer: Medicare Other

## 2018-12-28 DIAGNOSIS — D696 Thrombocytopenia, unspecified: Secondary | ICD-10-CM

## 2018-12-28 DIAGNOSIS — J9621 Acute and chronic respiratory failure with hypoxia: Secondary | ICD-10-CM

## 2018-12-28 DIAGNOSIS — I10 Essential (primary) hypertension: Secondary | ICD-10-CM

## 2018-12-28 DIAGNOSIS — I480 Paroxysmal atrial fibrillation: Secondary | ICD-10-CM

## 2018-12-28 DIAGNOSIS — N183 Chronic kidney disease, stage 3 (moderate): Secondary | ICD-10-CM

## 2018-12-28 LAB — POCT I-STAT 7, (LYTES, BLD GAS, ICA,H+H)
Acid-base deficit: 6 mmol/L — ABNORMAL HIGH (ref 0.0–2.0)
Bicarbonate: 16.6 mmol/L — ABNORMAL LOW (ref 20.0–28.0)
Calcium, Ion: 1.04 mmol/L — ABNORMAL LOW (ref 1.15–1.40)
HCT: 44 % (ref 39.0–52.0)
Hemoglobin: 15 g/dL (ref 13.0–17.0)
O2 Saturation: 96 %
Patient temperature: 98.6
Potassium: 3.7 mmol/L (ref 3.5–5.1)
Sodium: 142 mmol/L (ref 135–145)
TCO2: 17 mmol/L — ABNORMAL LOW (ref 22–32)
pCO2 arterial: 25.5 mmHg — ABNORMAL LOW (ref 32.0–48.0)
pH, Arterial: 7.423 (ref 7.350–7.450)
pO2, Arterial: 81 mmHg — ABNORMAL LOW (ref 83.0–108.0)

## 2018-12-28 LAB — LACTIC ACID, PLASMA: Lactic Acid, Venous: 5.1 mmol/L (ref 0.5–1.9)

## 2018-12-28 MED ORDER — SODIUM CHLORIDE 0.9 % IV BOLUS
500.0000 mL | Freq: Once | INTRAVENOUS | Status: AC
  Administered 2018-12-28: 500 mL via INTRAVENOUS

## 2018-12-28 MED ORDER — METHYLPREDNISOLONE SODIUM SUCC 125 MG IJ SOLR
40.0000 mg | Freq: Three times a day (TID) | INTRAMUSCULAR | Status: DC
  Administered 2018-12-28 – 2018-12-31 (×9): 40 mg via INTRAVENOUS
  Filled 2018-12-28 (×10): qty 2

## 2018-12-28 MED ORDER — METOPROLOL TARTRATE 5 MG/5ML IV SOLN: 5.0000 mg | Freq: Four times a day (QID) | INTRAVENOUS | Status: DC | PRN

## 2018-12-28 MED ORDER — CHLORHEXIDINE GLUCONATE 0.12 % MT SOLN
15.0000 mL | Freq: Two times a day (BID) | OROMUCOSAL | Status: DC
  Administered 2018-12-28 – 2019-01-03 (×12): 15 mL via OROMUCOSAL
  Filled 2018-12-28 (×10): qty 15

## 2018-12-28 MED ORDER — IPRATROPIUM-ALBUTEROL 20-100 MCG/ACT IN AERS
1.0000 | INHALATION_SPRAY | Freq: Four times a day (QID) | RESPIRATORY_TRACT | Status: DC | PRN
  Filled 2018-12-28: qty 4

## 2018-12-28 MED ORDER — ORAL CARE MOUTH RINSE
15.0000 mL | Freq: Two times a day (BID) | OROMUCOSAL | Status: DC
  Administered 2018-12-28 – 2019-01-03 (×14): 15 mL via OROMUCOSAL

## 2018-12-28 MED ORDER — FUROSEMIDE 10 MG/ML IJ SOLN: 40.0000 mg | Freq: Two times a day (BID) | INTRAMUSCULAR | Status: DC

## 2018-12-28 MED ORDER — METOPROLOL TARTRATE 5 MG/5ML IV SOLN: 2.5000 mg | Freq: Four times a day (QID) | INTRAVENOUS | Status: DC | PRN

## 2018-12-28 NOTE — Progress Notes (Signed)
Spoke with Dr Shanon Brow re: elevated Lactic Acid after giving her his VS, orders were received for NSS 500cc bolus but will give with caution due to BNP of 3000 and EF 25-30%, will continue to monitor.

## 2018-12-28 NOTE — Progress Notes (Signed)
[facility] PROGRESS NOTE    Progress Note  Timothy Suarez  ZSW:[id number] DOB: 01-27-1925 DOA: 01/01/2019 PCP: Timothy Curry, DO     Brief Narrative:   Timothy Suarez is an 85 y.o. male past medical history significant for CAD essential hypertension, chronic atrial fibrillation status post Maze procedure on Eliquis, chronic systolic heart failure with an EF of 20%, chronic kidney disease III, mitral valve prolapse with severe mitral regurgitation status post laryngectomy due to a laryngeal cancer, right bundle branch block with a second-degree AV block type I who is transfer from [place] facility comes in for shortness of breath.  Most of the history was obtained from the chart as the patient was lethargic on admission. Admitted from 11/16/2018 through 11/23/2018 for left femoral fracture who underwent ORIF on 11/18/2018, his hospitalization was complicated for respiratory failure which responded to diuresis. As per daughter he was recently started on Levaquin for pneumonia, and was sent to the ED for worsening dyspnea. Assessment/Plan:   Acute on chronic respiratory failure with hypoxia due to COVID-19 virus infection due to SARS-CoV-2 infection: Recently discharged from the hospital on 11/26/2018. He was desaturating on admission, he was started on supplemental oxygen. He was also started empirically on vitamin C and zinc. SARS-CoV-2 to PCR was positive. He is in distress breathing about 20-30 times per minute, his saturations have been satting greater than 95% on 15 L nonrebreather through his trach stoma. He is lymphopenic, with a less than 0.1 procalcitonin will discontinue IV vancomycin and cefepime.  He is acute respiratory failure is likely due to SARS-CoV-2.  Chronic systolic heart failure with an EF of 20%: He appeared hypovolemic on admission he was given 500 cc bolus of normal saline. Diuretic therapies were held. He is clearly not on a beta-blocker and  ACE inhibitor. Borderline we will continue to monitor closely.  Paroxysmal atrial fibrillation [facility]): Currently rate controlled not on a beta-blocker. Hold eluquis as he is having hemoptysis.  Hemoptysis: Stop Eliquis check CXR.  His saturations have remained stable, and had a long discussion with the daughter and she agrees for him to be a DNR/DNI, she does not wish to escalate care and she wishes to treat him conservatively. He is waiting for his sister to come up from [place] to come and see her dad.  Chronic Kidney disease stage III: Creatinine seems to be at baseline.  Vocal cord cancer status post laryngectomy: Daughter to bring his electro pharyngeal communication and his hearing aid.  Goals of care care ethics: Due to his multiple comorbidities age and poor prognosis, the daughter agreed for DNR/DNI. She would not like escalation of care but she understands that if he decompensates comfort will be our top priority. Keep patient DNR/DNI.   DVT prophylaxis: SCD Family Communication:duaghter Disposition Plan/Barrier to D/C: unable to determine Code Status:     Code Status Orders  (From admission, onward)         Start     Ordered   12/12/2018 2030  Do not attempt resuscitation (DNR)  Continuous    Question Answer Comment  In the event of cardiac or respiratory ARREST Do not call a "code blue"   In the event of cardiac or respiratory ARREST Do not perform Intubation, CPR, defibrillation or ACLS   In the event of cardiac or respiratory ARREST Use medication by any route, position, wound care, and other measures to relive pain and suffering. May use oxygen, suction and manual treatment  of airway obstruction as needed for comfort.      01/01/2019 2032        Code Status History    Date Active Date Inactive Code Status Order ID Comments User Context   11/20/2018 1333 11/23/2018 1847 DNR [id number]  Timothy Shiley, MD Inpatient   11/17/2018 0235 11/20/2018 1333 Full Code  [id number]  Timothy Leff, MD ED   08/10/2017 2025 08/19/2017 1457 Full Code [id number]  Timothy Reach, MD ED   12/06/2016 1301 12/08/2016 1905 Full Code [id number]  Timothy Mocha, MD ED   09/21/2016 1123 12/06/2016 0840 DNR [id number]  Timothy Curry, DO Outpatient   05/04/2016 1113 05/15/2016 2053 Full Code [id number]  Timothy Gala, MD Inpatient   04/09/2016 1057 04/10/2016 1446 Full Code [id number]  Timothy Gala, MD Inpatient   03/26/2016 1424 03/27/2016 1453 Full Code [id number]  Timothy Gala, MD Inpatient   02/21/2016 2340 03/03/2016 1733 Full Code [id number]  Timothy Quitter, MD Inpatient        IV Access:    Peripheral IV   Procedures and diagnostic studies:   Dg Chest Port 1 View  Result Date: 12/29/2018 CLINICAL DATA:  Mental status change EXAM: PORTABLE CHEST 1 VIEW COMPARISON:  11/21/2018, 11/16/2018 FINDINGS: Cardiomegaly with small left greater than right pleural effusions. Multifocal bilateral ground-glass opacity with consolidative process at the left base. Aortic atherosclerosis. No pneumothorax. IMPRESSION: 1. Development of multifocal bilateral ground-glass opacity, suspicious for pneumonia, could consider atypical or viral pneumonia in the appropriate clinical setting. 2. Cardiomegaly with suspected small pleural effusion. Persistent dense consolidation at the left lung base. Electronically Signed   By: Donavan Foil M.D.   On: 12/25/2018 17:07     Medical Consultants:    None.  Anti-Infectives:   None  Subjective:    Timothy Suarez nonverbal, appears uncomfortable.  Objective:    Vitals:   01/07/2019 2230 12/28/18 0001 12/28/18 0115 12/28/18 0548  BP: 113/71 99/60  115/90  Pulse: (!) 29 65  (!) 105  Resp: (!) 26 (!) 31  (!) 40  Temp:    98.2 F (36.8 C)  TempSrc:    Axillary  SpO2: 97%   92%  Weight:   79 kg   Height:   5\' 6"  (1.676 m)     Intake/Output Summary (Last 24 hours) at 12/28/2018 0820 Last data filed at 12/28/2018 0700 Gross per 24 hour   Intake 641.43 ml  Output 150 ml  Net 491.43 ml   Filed Weights   01/04/2019 1603 12/17/2018 1635 12/28/18 0115  Weight: 79 kg 79 kg 79 kg    Exam: General exam: Appears uncomfortable. Respiratory system: Good air movement with diffuse crackles at bases. Cardiovascular system: S1 & S2 heard, RRR.  Gastrointestinal system: Abdomen is nondistended, soft and nontender.  Extremities: No pedal edema. Skin: No rashes, lesions or ulcers tracheostomy in place residual blood.    Data Reviewed:    Labs: Basic Metabolic Panel: Recent Labs  Lab 12/13/2018 1631 12/14/2018 1652  NA 138 141  K 3.6 3.6  CL 108  --   CO2 19*  --   GLUCOSE 134*  --   BUN 23  --   CREATININE 1.54*  --   CALCIUM 7.6*  --    GFR Estimated Creatinine Clearance: 30.9 mL/min (A) (by C-G formula based on SCr of 1.54 mg/dL (H)). Liver Function Tests: Recent Labs  Lab 12/18/2018 1631  AST 27  ALT 11  ALKPHOS 106  BILITOT 1.5*  PROT 5.3*  ALBUMIN 2.4*   Recent Labs  Lab 12/30/2018 1631  LIPASE 28   No results for input(s): [place] in the last 168 hours. Coagulation profile No results for input(s): INR, PROTIME in the last 168 hours. COVID-19 Labs  Recent Labs    01/06/2019 2203  DDIMER 1.80*  FERRITIN 726*  LDH 382*  CRP 7.8*    Lab Results  Component Value Date   SARSCOV2NAA POSITIVE (A) 12/21/2018    CBC: Recent Labs  Lab 12/11/2018 1631 12/20/2018 1652  WBC 3.9*  --   NEUTROABS 3.5  --   HGB 13.1 12.6*  HCT 40.3 37.0*  MCV 98.1  --   PLT 95*  --    Cardiac Enzymes: No results for input(s): CKTOTAL, CKMB, CKMBINDEX, TROPONINI in the last 168 hours. BNP (last 3 results) No results for input(s): PROBNP in the last 8760 hours. CBG: No results for input(s): GLUCAP in the last 168 hours. D-Dimer: Recent Labs    12/28/2018 2203  DDIMER 1.80*   Hgb A1c: No results for input(s): HGBA1C in the last 72 hours. Lipid Profile: No results for input(s): CHOL, HDL, LDLCALC, TRIG, CHOLHDL,  LDLDIRECT in the last 72 hours. Thyroid function studies: No results for input(s): TSH, T4TOTAL, T3FREE, THYROIDAB in the last 72 hours.  Invalid input(s): FREET3 Anemia work up: Recent Labs    12/18/2018 2203  FERRITIN 726*   Sepsis Labs: Recent Labs  Lab 12/26/2018 [place] <0.10  WBC 3.9*  LATICACIDVEN 3.3*   Microbiology Recent Results (from the past 240 hour(s))  SARS Coronavirus 2 (CEPHEID- Performed in [place] hospital lab), Hosp Order     Status: Abnormal   Collection Time: 12/29/2018  5:28 PM  Result Value Ref Range Status   SARS Coronavirus 2 POSITIVE (A) NEGATIVE Final    Comment: RESULT CALLED TO, READ BACK BY AND VERIFIED WITH: S GRINDSTAFF RN 12/30/2018 1906 JDW (NOTE) If result is NEGATIVE SARS-CoV-2 target nucleic acids are NOT DETECTED. The SARS-CoV-2 RNA is generally detectable in upper and lower  respiratory specimens during the acute phase of infection. The lowest  concentration of SARS-CoV-2 viral copies this assay can detect is 250  copies / mL. A negative result does not preclude SARS-CoV-2 infection  and should not be used as the sole basis for treatment or other  patient management decisions.  A negative result may occur with  improper specimen collection / handling, submission of specimen other  than nasopharyngeal swab, presence of viral mutation(s) within the  areas targeted by this assay, and inadequate number of viral copies  (<250 copies / mL). A negative result must be combined with clinical  observations, patient history, and epidemiological information. If result is POSITIVE SARS-CoV-2 target nucleic acids are DETECTED. The  SARS-CoV-2 RNA is generally detectable in upper and lower  respiratory specimens during the acute phase of infection.  Positive  results are indicative of active infection with SARS-CoV-2.  Clinical  correlation with patient history and other diagnostic information is  necessary to determine patient infection  status.  Positive results do  not rule out bacterial infection or co-infection with other viruses. If result is PRESUMPTIVE POSTIVE SARS-CoV-2 nucleic acids MAY BE PRESENT.   A presumptive positive result was obtained on the submitted specimen  and confirmed on repeat testing.  While 2019 novel coronavirus  (SARS-CoV-2) nucleic acids may be present in the submitted sample  additional confirmatory testing may be necessary for epidemiological  and / or clinical management  purposes  to differentiate between  SARS-CoV-2 and other Sarbecovirus currently known to infect humans.  If clinically indicated additional testing with an alternate test  methodology [phone]) is ad vised. The SARS-CoV-2 RNA is generally  detectable in upper and lower respiratory specimens during the acute  phase of infection. The expected result is Negative. Fact Sheet for Patients:  StrictlyIdeas.no Fact Sheet for Healthcare Providers: BankingDealers.co.za This test is not yet approved or cleared by the [place] FDA and has been authorized for detection and/or diagnosis of SARS-CoV-2 by FDA under an Emergency Use Authorization (EUA).  This EUA will remain in effect (meaning this test can be used) for the duration of the COVID-19 declaration under Section 564(b)(1) of the Act, 21 U.S.C. section 360bbb-3(b)(1), unless the authorization is terminated or revoked sooner. Performed at [facility], [place] [address], [place], [place] [address]      Medications:   . apixaban  2.5 mg Oral BID  . atorvastatin  40 mg Oral QPM  . chlorhexidine  15 mL Mouth Rinse BID  . furosemide  40 mg Intravenous Q12H  . mouth rinse  15 mL Mouth Rinse q12n4p  . sodium chloride flush  3 mL Intravenous Q12H  . vitamin C  500 mg Oral Daily  . zinc sulfate  220 mg Oral Daily   Continuous Infusions: . ceFEPime (MAXIPIME) IV 2 g (12/28/18 0644)  . [START ON 12/29/2018] vancomycin         LOS: 1 day   Charlynne Cousins  [facility]  12/28/2018, 8:20 AM

## 2018-12-29 LAB — CBC WITH DIFFERENTIAL/PLATELET
Abs Immature Granulocytes: 0.04 10*3/uL (ref 0.00–0.07)
Basophils Absolute: 0 10*3/uL (ref 0.0–0.1)
Basophils Relative: 0 %
Eosinophils Absolute: 0 10*3/uL (ref 0.0–0.5)
Eosinophils Relative: 0 %
HCT: 46.7 % (ref 39.0–52.0)
Hemoglobin: 14.6 g/dL (ref 13.0–17.0)
Immature Granulocytes: 1 %
Lymphocytes Relative: 8 %
Lymphs Abs: 0.5 10*3/uL — ABNORMAL LOW (ref 0.7–4.0)
MCH: 31.5 pg (ref 26.0–34.0)
MCHC: 31.3 g/dL (ref 30.0–36.0)
MCV: 100.9 fL — ABNORMAL HIGH (ref 80.0–100.0)
Monocytes Absolute: 0.1 10*3/uL (ref 0.1–1.0)
Monocytes Relative: 1 %
Neutro Abs: 6 10*3/uL (ref 1.7–7.7)
Neutrophils Relative %: 90 %
Platelets: 117 10*3/uL — ABNORMAL LOW (ref 150–400)
RBC: 4.63 MIL/uL (ref 4.22–5.81)
RDW: 15.3 % (ref 11.5–15.5)
WBC: 6.7 10*3/uL (ref 4.0–10.5)
nRBC: 0 % (ref 0.0–0.2)

## 2018-12-29 LAB — COMPREHENSIVE METABOLIC PANEL
ALT: 11 U/L (ref 0–44)
AST: 36 U/L (ref 15–41)
Albumin: 2.3 g/dL — ABNORMAL LOW (ref 3.5–5.0)
Alkaline Phosphatase: 114 U/L (ref 38–126)
Anion gap: 8 (ref 5–15)
BUN: 29 mg/dL — ABNORMAL HIGH (ref 8–23)
CO2: 23 mmol/L (ref 22–32)
Calcium: 7.3 mg/dL — ABNORMAL LOW (ref 8.9–10.3)
Chloride: 113 mmol/L — ABNORMAL HIGH (ref 98–111)
Creatinine, Ser: 1.29 mg/dL — ABNORMAL HIGH (ref 0.61–1.24)
GFR calc Af Amer: 56 mL/min — ABNORMAL LOW (ref >60)
GFR calc non Af Amer: 48 mL/min — ABNORMAL LOW (ref >60)
Glucose, Bld: 111 mg/dL — ABNORMAL HIGH (ref 70–99)
Potassium: 4 mmol/L (ref 3.5–5.1)
Sodium: 144 mmol/L (ref 135–145)
Total Bilirubin: 1.2 mg/dL (ref 0.3–1.2)
Total Protein: 5.5 g/dL — ABNORMAL LOW (ref 6.5–8.1)

## 2018-12-29 LAB — FERRITIN: Ferritin: 1625 ng/mL — ABNORMAL HIGH (ref 24–336)

## 2018-12-29 LAB — PHOSPHORUS: Phosphorus: 4.3 mg/dL (ref 2.5–4.6)

## 2018-12-29 LAB — CK: Total CK: 307 U/L (ref 49–397)

## 2018-12-29 LAB — C-REACTIVE PROTEIN: CRP: 15 mg/dL — ABNORMAL HIGH (ref <1.0)

## 2018-12-29 LAB — D-DIMER, QUANTITATIVE: D-Dimer, Quant: 3.23 ug/mL-FEU — ABNORMAL HIGH (ref 0.00–0.50)

## 2018-12-29 LAB — MAGNESIUM: Magnesium: 2.1 mg/dL (ref 1.7–2.4)

## 2018-12-29 MED ORDER — TOCILIZUMAB 400 MG/20ML IV SOLN
8.0000 mg/kg | Freq: Once | INTRAVENOUS | Status: DC
  Filled 2018-12-29: qty 31.6

## 2018-12-29 NOTE — Progress Notes (Signed)
[facility] PROGRESS NOTE    Progress Note  Timothy Suarez  NIO:[id number] DOB: 02-20-1925 DOA: 01/07/2019 PCP: Gayland Curry, DO     Brief Narrative:   Timothy Suarez is an 85 y.o. male past medical history significant for CAD essential hypertension, chronic atrial fibrillation status post Maze procedure on Eliquis, chronic systolic heart failure with an EF of 20%, chronic kidney disease III, mitral valve prolapse with severe mitral regurgitation status post laryngectomy due to a laryngeal cancer, right bundle branch block with a second-degree AV block type I who is transfer from [place] facility comes in for shortness of breath.  Most of the history was obtained from the chart as the patient was lethargic on admission. Admitted from 11/16/2018 through 11/23/2018 for left femoral fracture who underwent ORIF on 11/18/2018, his hospitalization was complicated for respiratory failure which responded to diuresis. As per daughter he was recently started on Levaquin for pneumonia, and was sent to the ED for worsening dyspnea.  Medications: 12/27/2020 12/31/2018 IV steroids 12/29/2018 IV Actemra Assessment/Plan:   Acute on chronic respiratory failure with hypoxia due to COVID-19 virus infection due to SARS-CoV-2 infection: Recently discharged from the hospital on 11/26/2018. He was desaturating on admission, he was started on supplemental oxygen. He was also started empirically on vitamin C and zinc. SARS-CoV-2 to PCR was positive. No further hemoptysis his respiration is slightly improved compared to yesterday.  Aspiration rate this morning is 22. Inflammatory markers are trending up, will go ahead and give him Actemra. The treatment plan and use of medications (Actemra)  and known side effects were discussed with patient/family, they were clearly explained that there is no proven definitive treatment for COVID-19 infection, any medications used here are based on published  clinical articles/anecdotal data which are not peer-reviewed or randomized control trials.  Complete risks and long-term side effects are unknown, however in the best clinical judgment they seem to be of some clinical benefit rather than medical risks.  Patient/family agree with the treatment plan and want to receive the given medications.  COVID-19 Labs  Recent Labs    12/16/2018 2203 12/29/18 0354  DDIMER 1.80* 3.23*  FERRITIN 726* 1,625*  LDH 382*  --   CRP 7.8* 15.0*    Lab Results  Component Value Date   SARSCOV2NAA POSITIVE (A) 01/02/2019     Chronic systolic heart failure with an EF of 20%: He appeared hypovolemic on admission he was given 500 cc bolus of normal saline. Diuretic therapies were held. He is clearly not on a beta-blocker and ACE inhibitor. Borderline we will continue to monitor closely.  Paroxysmal atrial fibrillation [facility]): Currently rate controlled not on a beta-blocker. Hold eluquis as he is having hemoptysis. I have spoken to the daughter about stroke risk of stopping his Eliquis.  Hemoptysis: No further hemoptysis today. I have personally reviewed the chest x-ray showed bilateral opacities with slight interval worsening of the right midlung and possible diffuse infiltrate with a small pleural effusion on the right. His saturations have remained stable, and had a long discussion with the daughter and she agrees for him to be a DNR/DNI, she does not wish to escalate care and she wishes to treat him conservatively.  Acute kidney injury on chronic Kidney disease stage III: Baseline creatinine of around 1, likely hemodynamically mediated in the setting of SARS-CoV-2 infection has improved significantly with IV fluid resuscitation.  Vocal cord cancer status post laryngectomy: Daughter to bring his electro pharyngeal communication and his  hearing aid.  Goals of care care ethics: Due to his multiple comorbidities age and poor prognosis, the daughter agreed  for DNR/DNI. She would not like escalation of care but she understands that if he decompensates comfort will be our top priority. Keep patient DNR/DNI.   DVT prophylaxis: SCD Family Communication:duaghter Disposition Plan/Barrier to D/C: unable to determine Code Status:     Code Status Orders  (From admission, onward)         Start     Ordered   12/11/2018 2030  Do not attempt resuscitation (DNR)  Continuous    Question Answer Comment  In the event of cardiac or respiratory ARREST Do not call a "code blue"   In the event of cardiac or respiratory ARREST Do not perform Intubation, CPR, defibrillation or ACLS   In the event of cardiac or respiratory ARREST Use medication by any route, position, wound care, and other measures to relive pain and suffering. May use oxygen, suction and manual treatment of airway obstruction as needed for comfort.      12/20/2018 2032        Code Status History    Date Active Date Inactive Code Status Order ID Comments User Context   11/20/2018 1333 11/23/2018 1847 DNR [id number]  Elmarie Shiley, MD Inpatient   11/17/2018 0235 11/20/2018 1333 Full Code [id number]  Shela Leff, MD ED   08/10/2017 2025 08/19/2017 1457 Full Code [id number]  Elwyn Reach, MD ED   12/06/2016 1301 12/08/2016 1905 Full Code [id number]  Elwin Mocha, MD ED   09/21/2016 1123 12/06/2016 0840 DNR [id number]  Gayland Curry, DO Outpatient   05/04/2016 1113 05/15/2016 2053 Full Code [id number]  Izora Gala, MD Inpatient   04/09/2016 1057 04/10/2016 1446 Full Code [id number]  Izora Gala, MD Inpatient   03/26/2016 1424 03/27/2016 1453 Full Code [id number]  Izora Gala, MD Inpatient   02/21/2016 2340 03/03/2016 1733 Full Code [id number]  Melida Quitter, MD Inpatient        IV Access:    Peripheral IV   Procedures and diagnostic studies:   Dg Chest Port 1 View  Result Date: 12/28/2018 CLINICAL DATA:  Shortness of breath. EXAM: PORTABLE CHEST 1 VIEW COMPARISON:  12/29/2018 FINDINGS:  The cardiac silhouette remains enlarged. There is mild worsening of right midlung opacity which appears more confluent than on the prior study. Left mid lung and bibasilar opacities have not significantly changed. There are small left and likely small right pleural effusions, similar to the prior study. No pneumothorax is identified. IMPRESSION: Bilateral lung opacities with slight interval worsening in the right midlung, possibly multifocal pneumonia or edema with small pleural effusions. Electronically Signed   By: Logan Bores M.D.   On: 12/28/2018 09:24   Dg Chest Port 1 View  Result Date: 12/24/2018 CLINICAL DATA:  Mental status change EXAM: PORTABLE CHEST 1 VIEW COMPARISON:  11/21/2018, 11/16/2018 FINDINGS: Cardiomegaly with small left greater than right pleural effusions. Multifocal bilateral ground-glass opacity with consolidative process at the left base. Aortic atherosclerosis. No pneumothorax. IMPRESSION: 1. Development of multifocal bilateral ground-glass opacity, suspicious for pneumonia, could consider atypical or viral pneumonia in the appropriate clinical setting. 2. Cardiomegaly with suspected small pleural effusion. Persistent dense consolidation at the left lung base. Electronically Signed   By: Donavan Foil M.D.   On: 01/01/2019 17:07     Medical Consultants:    None.  Anti-Infectives:   None  Subjective:    Timothy Suarez appears comfortable today giving thumbs  up.  Daughter is to bring his machine so he can speak to [place].  Objective:    Vitals:   12/28/18 2300 12/29/18 0000 12/29/18 0100 12/29/18 0621  BP: (!) 105/58 (!) 111/58 114/76 105/69  Pulse: 79 64 62 69  Resp: (!) 29 (!) 27 (!) 27 (!) 33  Temp:    (!) 97.5 F (36.4 C)  TempSrc:    Oral  SpO2: 100% 100% 100% 92%  Weight:      Height:        Intake/Output Summary (Last 24 hours) at 12/29/2018 0823 Last data filed at 12/29/2018 0631 Gross per 24 hour  Intake 703 ml  Output 200 ml  Net 503 ml    Filed Weights   12/31/2018 1603 12/18/2018 1635 12/28/18 0115  Weight: 79 kg 79 kg 79 kg    Exam: General exam: In no acute distres ostomy right blood no signs of infection. Respiratory system: Good air movement and crackles. Cardiovascular system: S1 & S2 heard, RRR. Gastrointestinal system: Abdomen is nondistended, soft and nontender.  Central nervous system: Alert and oriented. No focal neurological deficits. Extremities: No pedal edema. Skin: No rashes, lesions or ulcers  Data Reviewed:    Labs: Basic Metabolic Panel: Recent Labs  Lab 12/25/2018 1631 12/11/2018 1652 12/28/18 0832 12/29/18 0354  NA 138 141 142 144  K 3.6 3.6 3.7 4.0  CL 108  --   --  113*  CO2 19*  --   --  23  GLUCOSE 134*  --   --  111*  BUN 23  --   --  29*  CREATININE 1.54*  --   --  1.29*  CALCIUM 7.6*  --   --  7.3*  MG  --   --   --  2.1  PHOS  --   --   --  4.3   GFR Estimated Creatinine Clearance: 36.9 mL/min (A) (by C-G formula based on SCr of 1.29 mg/dL (H)). Liver Function Tests: Recent Labs  Lab 12/30/2018 1631 12/29/18 0354  AST 27 36  ALT 11 11  ALKPHOS 106 114  BILITOT 1.5* 1.2  PROT 5.3* 5.5*  ALBUMIN 2.4* 2.3*   Recent Labs  Lab 12/28/2018 1631  LIPASE 28   No results for input(s): AMMONIA in the last 168 hours. Coagulation profile No results for input(s): INR, PROTIME in the last 168 hours. COVID-19 Labs  Recent Labs    12/13/2018 2203 12/29/18 0354  DDIMER 1.80* 3.23*  FERRITIN 726* 1,625*  LDH 382*  --   CRP 7.8* 15.0*    Lab Results  Component Value Date   SARSCOV2NAA POSITIVE (A) 12/15/2018    CBC: Recent Labs  Lab 01/07/2019 1631 12/28/2018 1652 12/28/18 0832 12/29/18 0354  WBC 3.9*  --   --  6.7  NEUTROABS 3.5  --   --  6.0  HGB 13.1 12.6* 15.0 14.6  HCT 40.3 37.0* 44.0 46.7  MCV 98.1  --   --  100.9*  PLT 95*  --   --  117*   Cardiac Enzymes: Recent Labs  Lab 12/29/18 0354  CKTOTAL 307   BNP (last 3 results) No results for input(s): PROBNP in  the last 8760 hours. CBG: No results for input(s): GLUCAP in the last 168 hours. D-Dimer: Recent Labs    12/15/2018 2203 12/29/18 0354  DDIMER 1.80* 3.23*   Hgb A1c: No results for input(s): HGBA1C in the last 72 hours. Lipid Profile: No results for input(s): CHOL, HDL,  LDLCALC, TRIG, CHOLHDL, LDLDIRECT in the last 72 hours. Thyroid function studies: No results for input(s): TSH, T4TOTAL, T3FREE, THYROIDAB in the last 72 hours.  Invalid input(s): FREET3 Anemia work up: Recent Labs    12/20/2018 2203 12/29/18 0354  FERRITIN 726* 1,625*   Sepsis Labs: Recent Labs  Lab 12/19/2018 1631 12/28/18 1800 12/29/18 0354  PROCALCITON <0.10  --   --   WBC 3.9*  --  6.7  LATICACIDVEN 3.3* 5.1*  --    Microbiology Recent Results (from the past 240 hour(s))  Blood Culture (routine x 2)     Status: None (Preliminary result)   Collection Time: 01/05/2019  4:31 PM  Result Value Ref Range Status   Specimen Description BLOOD SITE NOT SPECIFIED  Final   Special Requests   Final    BOTTLES DRAWN AEROBIC AND ANAEROBIC Blood Culture adequate volume   Culture   Final    NO GROWTH < 24 HOURS Performed at [facility], [address] [place], [place] [address]    Report Status PENDING  Incomplete  Blood Culture (routine x 2)     Status: None (Preliminary result)   Collection Time: 01/02/2019  5:12 PM  Result Value Ref Range Status   Specimen Description BLOOD SITE NOT SPECIFIED  Final   Special Requests   Final    BOTTLES DRAWN AEROBIC AND ANAEROBIC Blood Culture results may not be optimal due to an inadequate volume of blood received in culture bottles   Culture   Final    NO GROWTH < 24 HOURS Performed at [facility], [place] [address], [place], [place] [address]    Report Status PENDING  Incomplete  SARS Coronavirus 2 (CEPHEID- Performed in [place] hospital lab), Hosp Order     Status: Abnormal   Collection Time: 12/18/2018  5:28 PM  Result Value Ref Range Status   SARS  Coronavirus 2 POSITIVE (A) NEGATIVE Final    Comment: RESULT CALLED TO, READ BACK BY AND VERIFIED WITH: S GRINDSTAFF RN 01/04/2019 1906 JDW (NOTE) If result is NEGATIVE SARS-CoV-2 target nucleic acids are NOT DETECTED. The SARS-CoV-2 RNA is generally detectable in upper and lower  respiratory specimens during the acute phase of infection. The lowest  concentration of SARS-CoV-2 viral copies this assay can detect is 250  copies / mL. A negative result does not preclude SARS-CoV-2 infection  and should not be used as the sole basis for treatment or other  patient management decisions.  A negative result may occur with  improper specimen collection / handling, submission of specimen other  than nasopharyngeal swab, presence of viral mutation(s) within the  areas targeted by this assay, and inadequate number of viral copies  (<250 copies / mL). A negative result must be combined with clinical  observations, patient history, and epidemiological information. If result is POSITIVE SARS-CoV-2 target nucleic acids are DETECTED. The  SARS-CoV-2 RNA is generally detectable in upper and lower  respiratory specimens during the acute phase of infection.  Positive  results are indicative of active infection with SARS-CoV-2.  Clinical  correlation with patient history and other diagnostic information is  necessary to determine patient infection status.  Positive results do  not rule out bacterial infection or co-infection with other viruses. If result is PRESUMPTIVE POSTIVE SARS-CoV-2 nucleic acids MAY BE PRESENT.   A presumptive positive result was obtained on the submitted specimen  and confirmed on repeat testing.  While 2019 novel coronavirus  (SARS-CoV-2) nucleic acids may  be present in the submitted sample  additional confirmatory testing may be necessary for epidemiological  and / or clinical management purposes  to differentiate between  SARS-CoV-2 and other Sarbecovirus currently known to  infect humans.  If clinically indicated additional testing with an alternate test  methodology [phone]) is ad vised. The SARS-CoV-2 RNA is generally  detectable in upper and lower respiratory specimens during the acute  phase of infection. The expected result is Negative. Fact Sheet for Patients:  StrictlyIdeas.no Fact Sheet for Healthcare Providers: BankingDealers.co.za This test is not yet approved or cleared by the [place] FDA and has been authorized for detection and/or diagnosis of SARS-CoV-2 by FDA under an Emergency Use Authorization (EUA).  This EUA will remain in effect (meaning this test can be used) for the duration of the COVID-19 declaration under Section 564(b)(1) of the Act, 21 U.S.C. section 360bbb-3(b)(1), unless the authorization is terminated or revoked sooner. Performed at [facility], [place] [address] [place], [place] [address]      Medications:   . atorvastatin  40 mg Oral QPM  . chlorhexidine  15 mL Mouth Rinse BID  . mouth rinse  15 mL Mouth Rinse q12n4p  . methylPREDNISolone (SOLU-MEDROL) injection  40 mg Intravenous Q8H  . sodium chloride flush  3 mL Intravenous Q12H  . vitamin C  500 mg Oral Daily  . zinc sulfate  220 mg Oral Daily   Continuous Infusions:     LOS: 2 days   Charlynne Cousins  [facility]  12/29/2018, 8:23 AM

## 2018-12-29 NOTE — TOC Initial Note (Signed)
Transition of Care [facility]) - Initial/Assessment Note    Patient Details  Name: Timothy Suarez MRN: [id number] Date of Birth: 20-Apr-1925  Transition of Care [facility]) CM/SW Contact:    Eileen Stanford, LCSW Phone Number: 12/29/2018, 2:05 PM  Clinical Narrative: Pt is only alert to self. Pt came from [facility] and was at [facility]( ILF) prior to SNF. CSW spoke with pt's daughter, Hilda Blades, via telephone. Pt's daughter states she was under the impression that pt would not survive to make it out of the hospital however if pt does, family has decided pt is under "NO circumstances going back to [employer] Pt's daughter states if pt is able to d/c he will go back to [employer] (ILF) and the family will hire private caregivers for 24/7 hour care.                  Expected Discharge Plan: [place] Barriers to Discharge: Continued Medical Work up   Patient Goals and CMS Choice Patient states their goals for this hospitalization and ongoing recovery are:: "to return to ILF with caregivers" per daughter   Choice offered to / list presented to : Adult Children  Expected Discharge Plan and Services Expected Discharge Plan: [place] In-house Referral: NA   Post Acute Care Choice: [place] arrangements for the past 2 months: [place]                                      Prior Living Arrangements/Services Living arrangements for the past 2 months: [place] Lives with:: Facility Resident, Self Patient language and need for interpreter reviewed:: Yes        Need for Family Participation in Patient Care: Yes (Comment) Care giver support system in place?: Yes (comment)   Criminal Activity/Legal Involvement Pertinent to Current Situation/Hospitalization: No - Comment as needed  Activities of Daily Living      Permission Sought/Granted Permission sought to share information with : Facility  Sport and exercise psychologist, Family Supports    Share Information with NAME: Hilda Blades  Permission granted to share info w AGENCY: [place] granted to share info w Relationship: Daughter  Permission granted to share info w Contact Information: [phone]  Emotional Assessment Appearance:: Appears stated age Attitude/Demeanor/Rapport: Unable to Assess Affect (typically observed): Unable to Assess Orientation: : Oriented to Self Alcohol / Substance Use: Not Applicable Psych Involvement: No (comment)  Admission diagnosis:  Severe comorbid illness [R69] Sepsis, due to unspecified organism, unspecified whether acute organ dysfunction present ([place]) [A41.9] Pneumonia due to COVID-19 virus [U07.1, J12.89] Patient Active Problem List   Diagnosis Date Noted  . Acute on chronic respiratory failure with hypoxia ([place]) 12/28/2018  . COVID-19 virus infection 12/11/2018  . Multifocal pneumonia 12/28/2018  . CKD (chronic kidney disease) stage 3, GFR 30-59 ml/min (HCC) 12/25/2018  . Closed displaced fracture of left femoral neck ([place]) 11/17/2018  . Fall 11/17/2018  . AKI (acute kidney injury) ([place]) 11/17/2018  . Oropharyngeal dysphagia 11/15/2017  . Alaryngeal voice 11/15/2017  . Cognitive deficits 09/06/2017  . Abnormal TSH 08/26/2017  . Thrombocytopenia ([place]) 08/26/2017  . Peripheral vascular disease ([place]) 08/26/2017  . Acute on chronic combined systolic and diastolic CHF (congestive heart failure) ([place]) 08/10/2017  . Hallucination 08/10/2017  . Balance problems 07/08/2017  . Baker cyst, left 07/08/2017  . Dilated cardiomyopathy ([place])  04/19/2017  . Chronic systolic congestive heart failure ([place]) 03/30/2017  . Left ventricular ejection fraction of 21% to 30% 12/29/2016  . Pulmonary edema 12/06/2016  . Presbycusis of both ears 05/25/2016  . Protein-calorie malnutrition, severe 02/26/2016  . Pharyngocutaneous fistula 02/21/2016  . S/P laryngectomy 01/15/2016  . History of  laryngeal cancer 12/31/2015  . Other fatigue 12/09/2015  . 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 Prostate cancer ([place]) 01/10/2015  . S/P MVR (mitral valve repair) 12/20/2014  . S/P Maze operation for atrial fibrillation 12/20/2014  . Obesity (BMI 30-39.9) 04/02/2014  . Prolonged Q-T interval on ECG 04/02/2014  . Mild cognitive impairment 04/02/2014  . Major depressive disorder, recurrent episode, mild ([place]) 02/08/2014  . Insomnia 02/08/2014  . Dizziness and giddiness 02/08/2014  . Generalized muscle weakness 02/08/2014  . Glaucoma 02/08/2014  . Colon cancer ([place])   . Coronary artery disease   . Moderate aortic insufficiency 01/03/2013  . Mild mitral regurgitation 01/03/2013  . Primary open angle glaucoma 12/03/2011  . MVP (mitral valve prolapse)   . Paroxysmal atrial fibrillation (HCC)   . HTN (hypertension)   . Depression   . chest wall hernia (lung hernia)    PCP:  Gayland Curry, DO Pharmacy:  No Pharmacies Listed    Social Determinants of Health (SDOH) Interventions    Readmission Risk Interventions No flowsheet data found.

## 2018-12-29 NOTE — [place] FL2 (Signed)
[place] MEDICAID FL2 LEVEL OF CARE SCREENING TOOL     IDENTIFICATION  Patient Name: Timothy Suarez Birthdate: June 02, 1925 Sex: male Admission Date (Current Location): 12/29/2018  [facility] and [place] Number:  Herbalist and Address:  [facility] [place], [place] [address], [place], [place] [address]      Provider Number: [id number]  Attending Physician Name and Address:  Charlynne Cousins, MD  Relative Name and Phone Number:       Current Level of Care: Hospital Recommended Level of Care: [place] Prior Approval Number:    Date Approved/Denied:   PASRR Number:    Discharge Plan: SNF    Current Diagnoses: Patient Active Problem List   Diagnosis Date Noted  . Acute on chronic respiratory failure with hypoxia ([place]) 12/28/2018  . COVID-19 virus infection 01/08/2019  . Multifocal pneumonia 12/11/2018  . CKD (chronic kidney disease) stage 3, GFR 30-59 ml/min (HCC) 12/17/2018  . Closed displaced fracture of left femoral neck ([place]) 11/17/2018  . Fall 11/17/2018  . AKI (acute kidney injury) ([place]) 11/17/2018  . Oropharyngeal dysphagia 11/15/2017  . Alaryngeal voice 11/15/2017  . Cognitive deficits 09/06/2017  . Abnormal TSH 08/26/2017  . Thrombocytopenia ([place]) 08/26/2017  . Peripheral vascular disease ([place]) 08/26/2017  . Acute on chronic combined systolic and diastolic CHF (congestive heart failure) ([place]) 08/10/2017  . Hallucination 08/10/2017  . Balance problems 07/08/2017  . Baker cyst, left 07/08/2017  . Dilated cardiomyopathy ([place]) 04/19/2017  . Chronic systolic congestive heart failure ([place]) 03/30/2017  . Left ventricular ejection fraction of 21% to 30% 12/29/2016  . Pulmonary edema 12/06/2016  . Presbycusis of both ears 05/25/2016  . Protein-calorie malnutrition, severe 02/26/2016  . Pharyngocutaneous fistula 02/21/2016  . S/P laryngectomy 01/15/2016  . History of laryngeal cancer 12/31/2015  . Other fatigue  12/09/2015  . 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 Prostate cancer ([place]) 01/10/2015  . S/P MVR (mitral valve repair) 12/20/2014  . S/P Maze operation for atrial fibrillation 12/20/2014  . Obesity (BMI 30-39.9) 04/02/2014  . Prolonged Q-T interval on ECG 04/02/2014  . Mild cognitive impairment 04/02/2014  . Major depressive disorder, recurrent episode, mild ([place]) 02/08/2014  . Insomnia 02/08/2014  . Dizziness and giddiness 02/08/2014  . Generalized muscle weakness 02/08/2014  . Glaucoma 02/08/2014  . Colon cancer ([place])   . Coronary artery disease   . Moderate aortic insufficiency 01/03/2013  . Mild mitral regurgitation 01/03/2013  . Primary open angle glaucoma 12/03/2011  . MVP (mitral valve prolapse)   . Paroxysmal atrial fibrillation (HCC)   . HTN (hypertension)   . Depression   . chest wall hernia (lung hernia)     Orientation RESPIRATION BLADDER Height & Weight     Self  Normal External catheter, Incontinent(placed 12/07/18) Weight: 174 lb 2.6 oz (79 kg) Height:  5\' 6"  (167.6 cm)  BEHAVIORAL SYMPTOMS/MOOD NEUROLOGICAL BOWEL NUTRITION STATUS      Incontinent Diet(NPO at this time. Please see d/c summary)  AMBULATORY STATUS COMMUNICATION OF NEEDS Skin   Extensive Assist Verbally Normal                       Personal Care Assistance Level of Assistance  Bathing, Feeding, Dressing Bathing Assistance: Maximum assistance Feeding assistance: Independent Dressing Assistance: Maximum assistance     Functional Limitations Info  Sight, Hearing, Speech Sight Info: Adequate Hearing Info: Adequate Speech Info: Adequate    SPECIAL  CARE FACTORS FREQUENCY  PT (By licensed PT), OT (By licensed OT)     PT Frequency: 5x OT Frequency: 5x            Contractures Contractures Info: Not present    Additional Factors Info  Code Status, Allergies, Isolation Precautions Code Status Info: DNR Allergies Info: Metoprolol, Citalopram,  Trazodone And Nefazodone, Zoloft Sertraline Hcl     Isolation Precautions Info: COVID-19 +     Current Medications (12/29/2018):  This is the current hospital active medication list Current Facility-Administered Medications  Medication Dose Route Frequency Provider Last Rate Last Dose  . acetaminophen (TYLENOL) tablet 650 mg  650 mg Oral Q6H PRN Zada Finders R, MD      . atorvastatin (LIPITOR) tablet 40 mg  40 mg Oral QPM Lenore Cordia, MD   40 mg at 12/28/18 1722  . chlorhexidine (PERIDEX) 0.12 % solution 15 mL  15 mL Mouth Rinse BID Zada Finders R, MD   15 mL at 12/29/18 1056  . chlorpheniramine-HYDROcodone (TUSSIONEX) 10-8 MG/5ML suspension 5 mL  5 mL Oral Q12H PRN Zada Finders R, MD      . guaiFENesin-dextromethorphan (ROBITUSSIN DM) 100-10 MG/5ML syrup 10 mL  10 mL Oral Q4H PRN Zada Finders R, MD      . Ipratropium-Albuterol (COMBIVENT) respimat 1 puff  1 puff Inhalation Q6H PRN Thurnell Lose, MD      . MEDLINE mouth rinse  15 mL Mouth Rinse q12n4p Zada Finders R, MD   15 mL at 12/28/18 1721  . methylPREDNISolone sodium succinate (SOLU-MEDROL) 125 mg/2 mL injection 40 mg  40 mg Intravenous Q8H Charlynne Cousins, MD   40 mg at 12/29/18 0558  . metoprolol tartrate (LOPRESSOR) injection 2.5 mg  2.5 mg Intravenous Q6H PRN Charlynne Cousins, MD      . metoprolol tartrate (LOPRESSOR) injection 5 mg  5 mg Intravenous Q6H PRN Charlynne Cousins, MD      . sodium chloride flush (NS) 0.9 % injection 3 mL  3 mL Intravenous Q12H Zada Finders R, MD   3 mL at 12/29/18 1057  . vitamin C (ASCORBIC ACID) tablet 500 mg  500 mg Oral Daily Zada Finders R, MD   500 mg at 12/29/18 1056  . zinc sulfate capsule 220 mg  220 mg Oral Daily Lenore Cordia, MD   220 mg at 12/29/18 1056     Discharge Medications: Please see discharge summary for a list of discharge medications.  Relevant Imaging Results:  Relevant Lab Results:   Additional Information SS#: [id number]. [place] Nancee Brownrigg, LCSW

## 2018-12-29 NOTE — Progress Notes (Signed)
Spoke with daughter Jackelyn Poling, family requested visit, due to unable to answer questions for family, they are aware I will follow up later after speaking with charge nurse regarding visiting.

## 2018-12-29 NOTE — Progress Notes (Signed)
Pt is alert and making needs known, he assisted with his bath by turning and complying, Pt does have some modeled discoloration  on lower ext around knees and toes. Pitting edema in lower ext. Skin tear with clear dressing to rt elbow. Pt has difficulty communicating due to [facility]. Mouth and skin care has been done multiple times due to pt remaining in bed.

## 2018-12-29 NOTE — Progress Notes (Signed)
Pt moved to room 172  In order for daughter Lattie Haw to come visit, Due to confusion regarding pts status, she was lead to believe he is at end of life. She flew in from [place] to visit, due to strange mix up she was permitted to visit, pt was alert and interacting with her, face timed other siblings, Pt received bil hearing aids and glasses. After visit returned to his room.

## 2018-12-30 LAB — PHOSPHORUS: Phosphorus: 3.9 mg/dL (ref 2.5–4.6)

## 2018-12-30 LAB — CBC WITH DIFFERENTIAL/PLATELET
Abs Immature Granulocytes: 0.08 10*3/uL — ABNORMAL HIGH (ref 0.00–0.07)
Basophils Absolute: 0.1 10*3/uL (ref 0.0–0.1)
Basophils Relative: 1 %
Eosinophils Absolute: 0.1 10*3/uL (ref 0.0–0.5)
Eosinophils Relative: 1 %
HCT: 50.4 % (ref 39.0–52.0)
Hemoglobin: 16.4 g/dL (ref 13.0–17.0)
Immature Granulocytes: 1 %
Lymphocytes Relative: 5 %
Lymphs Abs: 0.6 10*3/uL — ABNORMAL LOW (ref 0.7–4.0)
MCH: 32.4 pg (ref 26.0–34.0)
MCHC: 32.5 g/dL (ref 30.0–36.0)
MCV: 99.6 fL (ref 80.0–100.0)
Monocytes Absolute: 0.1 10*3/uL (ref 0.1–1.0)
Monocytes Relative: 1 %
Neutro Abs: 9.6 10*3/uL — ABNORMAL HIGH (ref 1.7–7.7)
Neutrophils Relative %: 91 %
Platelets: 140 10*3/uL — ABNORMAL LOW (ref 150–400)
RBC: 5.06 MIL/uL (ref 4.22–5.81)
RDW: 15.5 % (ref 11.5–15.5)
WBC: 10.5 10*3/uL (ref 4.0–10.5)
nRBC: 0 % (ref 0.0–0.2)

## 2018-12-30 LAB — FERRITIN: Ferritin: 2461 ng/mL — ABNORMAL HIGH (ref 24–336)

## 2018-12-30 LAB — COMPREHENSIVE METABOLIC PANEL
ALT: 14 U/L (ref 0–44)
AST: 46 U/L — ABNORMAL HIGH (ref 15–41)
Albumin: 2.5 g/dL — ABNORMAL LOW (ref 3.5–5.0)
Alkaline Phosphatase: 126 U/L (ref 38–126)
Anion gap: 13 (ref 5–15)
BUN: 42 mg/dL — ABNORMAL HIGH (ref 8–23)
CO2: 19 mmol/L — ABNORMAL LOW (ref 22–32)
Calcium: 7.8 mg/dL — ABNORMAL LOW (ref 8.9–10.3)
Chloride: 114 mmol/L — ABNORMAL HIGH (ref 98–111)
Creatinine, Ser: 1.48 mg/dL — ABNORMAL HIGH (ref 0.61–1.24)
GFR calc Af Amer: 47 mL/min — ABNORMAL LOW (ref >60)
GFR calc non Af Amer: 41 mL/min — ABNORMAL LOW (ref >60)
Glucose, Bld: 136 mg/dL — ABNORMAL HIGH (ref 70–99)
Potassium: 4.1 mmol/L (ref 3.5–5.1)
Sodium: 146 mmol/L — ABNORMAL HIGH (ref 135–145)
Total Bilirubin: 1.7 mg/dL — ABNORMAL HIGH (ref 0.3–1.2)
Total Protein: 6.2 g/dL — ABNORMAL LOW (ref 6.5–8.1)

## 2018-12-30 LAB — C-REACTIVE PROTEIN: CRP: 12.9 mg/dL — ABNORMAL HIGH (ref <1.0)

## 2018-12-30 LAB — CK: Total CK: 269 U/L (ref 49–397)

## 2018-12-30 LAB — MAGNESIUM: Magnesium: 2.6 mg/dL — ABNORMAL HIGH (ref 1.7–2.4)

## 2018-12-30 LAB — D-DIMER, QUANTITATIVE: D-Dimer, Quant: 4.82 ug/mL-FEU — ABNORMAL HIGH (ref 0.00–0.50)

## 2018-12-30 MED ORDER — MORPHINE SULFATE (PF) 2 MG/ML IV SOLN: 2.0000 mg | INTRAVENOUS | Status: DC | PRN

## 2018-12-30 MED ORDER — LORAZEPAM 2 MG/ML IJ SOLN: 1.0000 mg | INTRAMUSCULAR | Status: DC | PRN

## 2018-12-30 MED ORDER — HYDRALAZINE HCL 20 MG/ML IJ SOLN: 20.0000 mg | INTRAMUSCULAR | Status: DC | PRN

## 2018-12-30 NOTE — Progress Notes (Addendum)
CSW consulted with Timothy Suarez who reports she is refusing for patient to return to [facility]. Timothy Suarez is however able to take patient back. Timothy Suarez reports she would like patient to go to [facility] or [facility]. CSW has spoken to both facilities and neither are able to accept patient due to him being COVID +. Timothy Suarez informed of this, very emotional and upset. Timothy Suarez reports she thought patient is at end of life and is not sure she should be discussing his discharge planning at this point as she does not believe he will make it out of [place]. Timothy Suarez reports she will speak with her sister and call CSW back, as they were unaware that patient could not go to [facility] or [facility] due to being COVID + and have no other plans for him at this time.   Current plan is to hold off on dc planning until patient more medically stable.   [place], [place]

## 2018-12-30 NOTE — Plan of Care (Signed)
  Problem: Clinical Measurements: Goal: Respiratory complications will improve Outcome: Not Progressing   

## 2018-12-30 NOTE — Progress Notes (Signed)
[place] TEAM 1 - Stepdown/ICU TEAM  Timothy Suarez  LPF:[id number] DOB: Dec 26, 1924 DOA: 12/18/2018 PCP: Gayland Curry, DO    Brief Narrative:  [phone] w/ a hx of CAD, HTN, chronic atrial fibrillation s/p Maze procedure on Eliquis, systolic CHF (EF of 92%), CKD Stage III, MVP/severe mitral regurgitation, s/p laryngectomy due to laryngeal cancer, and RBB w/ a second-degree AV block type I who was transferred to the ED from [facility] w/ shortness of breath. He had been admitted 4/8 > 11/23/2018 for a L femoral fracture requiring an ORIF on 11/18/2018.   Significant Events: 5/19 admit from SNF   COVID-19 specific Treatment: Actemra 5/21 Steroids 5/20 > 5/23  Subjective: The pt is non-communicative at the time of my visit. He does not appear to be in uncontrolled pain or severe acute resp distress. He does not respond to my voice or touch.   The patient's daughter was able to visit him in the visitation room yesterday afternoon as there was some concern that he may be approaching end-of-life.  Assessment & Plan:  Acute hypoxic respiratory failure -COVID-19 pneumonia Continues to require significant oxygen support today -is obtunded at the time of my visit -will need more time to determine the trajectory of his clinical course -continue supportive care  Chronic systolic CHF -EF 42% No gross volume overload on physical exam -given lack of oral intake will avoid diuretics for now  Paroxysmal atrial fibrillation Rate controlled -anticoagulation on hold due to transient hemoptysis  Transient hemoptysis Appears to have resolved with discontinuation of Eliquis  Chronic kidney disease stage III with acute kidney injury Baseline creatinine reportedly approximately 1 - Creatinine has not yet returned to baseline but does appear relatively stable -follow trend  Severe mitral valve regurgitation  Laryngeal cancer status post laryngectomy   DVT prophylaxis: SCDs Code Status: DNR - NO  CODE Family Communication:  Disposition Plan:   Consultants:  None  Antimicrobials:  Cefepime 5/19 Vancomycin 5/19  Objective: Blood pressure 131/73, pulse 75, temperature 98.9 F (37.2 C), temperature source Axillary, resp. rate (!) 24, height 5\' 6"  (1.676 m), weight 79 kg, SpO2 100 %.  Intake/Output Summary (Last 24 hours) at 12/30/2018 1732 Last data filed at 12/30/2018 0600 Gross per 24 hour  Intake 6 ml  Output 100 ml  Net -94 ml   Filed Weights   12/22/2018 1603 01/01/2019 1635 12/28/18 0115  Weight: 79 kg 79 kg 79 kg    Examination: General: Requiring high level oxygen support -obtunded Lungs: Fine crackles diffusely Cardiovascular: Heart sounds very distant -no appreciable rub or murmur Abdomen: Soft, bowel sounds hypoactive, no rebound, no mass Extremities: No significant edema bilateral lower extremities  CBC: Recent Labs  Lab 12/30/2018 1631  12/28/18 0832 12/29/18 0354 12/30/18 0717  WBC 3.9*  --   --  6.7 10.5  NEUTROABS 3.5  --   --  6.0 9.6*  HGB 13.1   < > 15.0 14.6 16.4  HCT 40.3   < > 44.0 46.7 50.4  MCV 98.1  --   --  100.9* 99.6  PLT 95*  --   --  117* 140*   < > = values in this interval not displayed.   Basic Metabolic Panel: Recent Labs  Lab 01/05/2019 1631  12/28/18 0832 12/29/18 0354 12/30/18 0717  NA 138   < > 142 144 146*  K 3.6   < > 3.7 4.0 4.1  CL 108  --   --  113* 114*  CO2 19*  --   --  23 19*  GLUCOSE 134*  --   --  111* 136*  BUN 23  --   --  29* 42*  CREATININE 1.54*  --   --  1.29* 1.48*  CALCIUM 7.6*  --   --  7.3* 7.8*  MG  --   --   --  2.1 2.6*  PHOS  --   --   --  4.3 3.9   < > = values in this interval not displayed.   GFR: Estimated Creatinine Clearance: 32.1 mL/min (A) (by C-G formula based on SCr of 1.48 mg/dL (H)).  Liver Function Tests: Recent Labs  Lab 12/24/2018 1631 12/29/18 0354 12/30/18 0717  AST 27 36 46*  ALT 11 11 14   ALKPHOS 106 114 126  BILITOT 1.5* 1.2 1.7*  PROT 5.3* 5.5* 6.2*  ALBUMIN  2.4* 2.3* 2.5*   Recent Labs  Lab 12/30/2018 1631  LIPASE 28    Cardiac Enzymes: Recent Labs  Lab 12/29/18 0354 12/30/18 0717  CKTOTAL 307 269    HbA1C: Hgb A1c MFr Bld  Date/Time Value Ref Range Status  08/11/2017 05:43 AM 5.5 4.8 - 5.6 % Final    Comment:    (NOTE) Pre diabetes:          5.7%-6.4% Diabetes:              >6.4% Glycemic control for   <7.0% adults with diabetes   11-28-2008 10:48 AM  4.6 - 6.1 % Final   5.3 (NOTE) The ADA recommends the following therapeutic goal for glycemic control related to Hgb A1c measurement: Goal of therapy: <6.5 Hgb A1c  Reference: American Diabetes Association: Clinical Practice Recommendations 2010, Diabetes Care, 2010, 33: (Suppl  1).    Recent Results (from the past 240 hour(s))  Blood Culture (routine x 2)     Status: None (Preliminary result)   Collection Time: 12/20/2018  4:31 PM  Result Value Ref Range Status   Specimen Description BLOOD SITE NOT SPECIFIED  Final   Special Requests   Final    BOTTLES DRAWN AEROBIC AND ANAEROBIC Blood Culture adequate volume   Culture   Final    NO GROWTH 3 DAYS Performed at [facility], [address] [place], [place] [address]    Report Status PENDING  Incomplete  Blood Culture (routine x 2)     Status: None (Preliminary result)   Collection Time: 01/02/2019  5:12 PM  Result Value Ref Range Status   Specimen Description BLOOD SITE NOT SPECIFIED  Final   Special Requests   Final    BOTTLES DRAWN AEROBIC AND ANAEROBIC Blood Culture results may not be optimal due to an inadequate volume of blood received in culture bottles   Culture   Final    NO GROWTH 3 DAYS Performed at [facility], [place] [address], [place], [place] [address]    Report Status PENDING  Incomplete  SARS Coronavirus 2 (CEPHEID- Performed in [place] hospital lab), Hosp Order     Status: Abnormal   Collection Time: 12/28/2018  5:28 PM  Result Value Ref Range Status   SARS Coronavirus 2 POSITIVE (A)  NEGATIVE Final    Comment: RESULT CALLED TO, READ BACK BY AND VERIFIED WITH: S GRINDSTAFF RN 12/11/2018 1906 JDW (NOTE) If result is NEGATIVE SARS-CoV-2 target nucleic acids are NOT DETECTED. The SARS-CoV-2 RNA is generally detectable in upper and lower  respiratory specimens during the acute phase  of infection. The lowest  concentration of SARS-CoV-2 viral copies this assay can detect is 250  copies / mL. A negative result does not preclude SARS-CoV-2 infection  and should not be used as the sole basis for treatment or other  patient management decisions.  A negative result may occur with  improper specimen collection / handling, submission of specimen other  than nasopharyngeal swab, presence of viral mutation(s) within the  areas targeted by this assay, and inadequate number of viral copies  (<250 copies / mL). A negative result must be combined with clinical  observations, patient history, and epidemiological information. If result is POSITIVE SARS-CoV-2 target nucleic acids are DETECTED. The  SARS-CoV-2 RNA is generally detectable in upper and lower  respiratory specimens during the acute phase of infection.  Positive  results are indicative of active infection with SARS-CoV-2.  Clinical  correlation with patient history and other diagnostic information is  necessary to determine patient infection status.  Positive results do  not rule out bacterial infection or co-infection with other viruses. If result is PRESUMPTIVE POSTIVE SARS-CoV-2 nucleic acids MAY BE PRESENT.   A presumptive positive result was obtained on the submitted specimen  and confirmed on repeat testing.  While 2019 novel coronavirus  (SARS-CoV-2) nucleic acids may be present in the submitted sample  additional confirmatory testing may be necessary for epidemiological  and / or clinical management purposes  to differentiate between  SARS-CoV-2 and other Sarbecovirus currently known to infect humans.  If clinically  indicated additional testing with an alternate test  methodology [phone]) is ad vised. The SARS-CoV-2 RNA is generally  detectable in upper and lower respiratory specimens during the acute  phase of infection. The expected result is Negative. Fact Sheet for Patients:  StrictlyIdeas.no Fact Sheet for Healthcare Providers: BankingDealers.co.za This test is not yet approved or cleared by the [place] FDA and has been authorized for detection and/or diagnosis of SARS-CoV-2 by FDA under an Emergency Use Authorization (EUA).  This EUA will remain in effect (meaning this test can be used) for the duration of the COVID-19 declaration under Section 564(b)(1) of the Act, 21 U.S.C. section 360bbb-3(b)(1), unless the authorization is terminated or revoked sooner. Performed at [facility], [place] [address], [place], [place] [address]      Scheduled Meds: . atorvastatin  40 mg Oral QPM  . chlorhexidine  15 mL Mouth Rinse BID  . mouth rinse  15 mL Mouth Rinse q12n4p  . methylPREDNISolone (SOLU-MEDROL) injection  40 mg Intravenous Q8H  . sodium chloride flush  3 mL Intravenous Q12H  . vitamin C  500 mg Oral Daily  . zinc sulfate  220 mg Oral Daily     LOS: 3 days   Cherene Altes, MD [facility] Office  [phone] Pager - Text Page per Amion  If 7PM-7AM, please contact night-coverage per Amion 12/30/2018, 5:32 PM

## 2018-12-30 NOTE — Plan of Care (Signed)
  Problem: Clinical Measurements: Goal: Will remain free from infection Outcome: Progressing   Problem: Clinical Measurements: Goal: Respiratory complications will improve Outcome: Not Progressing

## 2018-12-31 MED ORDER — METHYLPREDNISOLONE SODIUM SUCC 40 MG IJ SOLR
40.0000 mg | Freq: Three times a day (TID) | INTRAMUSCULAR | Status: AC
  Administered 2018-12-31: 40 mg via INTRAVENOUS
  Filled 2018-12-31: qty 1

## 2018-12-31 MED ORDER — MORPHINE SULFATE (PF) 2 MG/ML IV SOLN
2.0000 mg | INTRAVENOUS | Status: DC | PRN
  Administered 2019-01-03 (×3): 2 mg via INTRAVENOUS
  Filled 2018-12-31 (×3): qty 1

## 2018-12-31 NOTE — Progress Notes (Signed)
Timothy [facility] TEAM 1 - Stepdown/ICU TEAM  NYZIR DUBOIS  JJK:[id number] DOB: [birth year] DOA: 01/06/2019 PCP: Gayland Curry, DO    Brief Narrative:  [phone] w/ a hx of CAD, HTN, chronic atrial fibrillation s/p Maze procedure on Eliquis, systolic CHF (EF of 96%), CKD Stage III, MVP/severe mitral regurgitation, s/p laryngectomy due to laryngeal cancer, and RBB w/ a second-degree AV block type I who was transferred to the ED from [facility] w/ shortness of breath. He had been admitted 4/8 > 11/23/2018 for a L femoral fracture requiring an ORIF on 11/18/2018.   Significant Events: 5/19 admit from SNF   COVID-19 specific Treatment: Actemra 5/21 Steroids 5/20 > 5/23  Subjective: BP is low this morning.  The patient's eyes are open and he intermittently makes eye contact with the examiner but he does not track me around the room nor does he interact with me.  He does not follow simple commands.  He appears to be somewhat uncomfortable and anxious.  He cannot provide a review of systems.  Assessment & Plan:  Acute hypoxic respiratory failure -COVID-19 pneumonia Continues to require significant oxygen support today - need more time to determine the trajectory of his clinical course -continue supportive care - does not appear to be improving at this time  Chronic systolic CHF -EF 78% No gross volume overload on physical exam -given lack of oral intake will cont to avoid diuretics for now - net + ~850cc this admit  Filed Weights   12/21/2018 1603 01/02/2019 1635 12/28/18 0115  Weight: 79 kg 79 kg 79 kg    Paroxysmal atrial fibrillation Rate controlled - anticoagulation on hold due to transient hemoptysis  Transient hemoptysis Appears to have resolved with discontinuation of Eliquis   Chronic kidney disease stage III with acute kidney injury Baseline creatinine reportedly approximately 1 - creatinine has not yet returned to baseline but does appear relatively stable - follow trend  Recent Labs   Lab 12/10/2018 1631 12/29/18 0354 12/30/18 0717  CREATININE 1.54* 1.29* 1.48*    Severe mitral valve regurgitation  Laryngeal cancer status post laryngectomy   DVT prophylaxis: SCDs Code Status: DNR - NO CODE Family Communication:  Disposition Plan:   Consultants:  None  Antimicrobials:  Cefepime 5/19 Vancomycin 5/19  Objective: Blood pressure 112/78, pulse 85, temperature 98 F (36.7 C), resp. rate (!) 28, height 5\' 6"  (1.676 m), weight 79 kg, SpO2 100 %.  Intake/Output Summary (Last 24 hours) at 12/31/2018 0906 Last data filed at 12/31/2018 0200 Gross per 24 hour  Intake 0 ml  Output 50 ml  Net -50 ml   Filed Weights   12/26/2018 1603 01/08/2019 1635 12/28/18 0115  Weight: 79 kg 79 kg 79 kg    Examination: General: Requiring high level oxygen support -more alert but altered Lungs: Fine crackles diffusely -no wheezing Cardiovascular: No appreciable rub -regular rate Abdomen: Soft, bowel sounds hypoactive, no rebound, no mass Extremities: No edema bilateral lower extremities  CBC: Recent Labs  Lab 12/10/2018 1631  12/28/18 0832 12/29/18 0354 12/30/18 0717  WBC 3.9*  --   --  6.7 10.5  NEUTROABS 3.5  --   --  6.0 9.6*  HGB 13.1   < > 15.0 14.6 16.4  HCT 40.3   < > 44.0 46.7 50.4  MCV 98.1  --   --  100.9* 99.6  PLT 95*  --   --  117* 140*   < > = values in this interval not displayed.  Basic Metabolic Panel: Recent Labs  Lab 12/25/2018 1631  12/28/18 0832 12/29/18 0354 12/30/18 0717  NA 138   < > 142 144 146*  K 3.6   < > 3.7 4.0 4.1  CL 108  --   --  113* 114*  CO2 19*  --   --  23 19*  GLUCOSE 134*  --   --  111* 136*  BUN 23  --   --  29* 42*  CREATININE 1.54*  --   --  1.29* 1.48*  CALCIUM 7.6*  --   --  7.3* 7.8*  MG  --   --   --  2.1 2.6*  PHOS  --   --   --  4.3 3.9   < > = values in this interval not displayed.   GFR: Estimated Creatinine Clearance: 32.1 mL/min (A) (by C-G formula based on SCr of 1.48 mg/dL (H)).  Liver Function Tests:  Recent Labs  Lab 12/21/2018 1631 12/29/18 0354 12/30/18 0717  AST 27 36 46*  ALT 11 11 14   ALKPHOS 106 114 126  BILITOT 1.5* 1.2 1.7*  PROT 5.3* 5.5* 6.2*  ALBUMIN 2.4* 2.3* 2.5*   Recent Labs  Lab 01/08/2019 1631  LIPASE 28    Cardiac Enzymes: Recent Labs  Lab 12/29/18 0354 12/30/18 0717  CKTOTAL 307 269    HbA1C: Hgb A1c MFr Bld  Date/Time Value Ref Range Status  08/11/2017 05:43 AM 5.5 4.8 - 5.6 % Final    Comment:    (NOTE) Pre diabetes:          5.7%-6.4% Diabetes:              >6.4% Glycemic control for   <7.0% adults with diabetes   24-Oct-202010 10:48 AM  4.6 - 6.1 % Final   5.3 (NOTE) The ADA recommends the following therapeutic goal for glycemic control related to Hgb A1c measurement: Goal of therapy: <6.5 Hgb A1c  Reference: American Diabetes Association: Clinical Practice Recommendations 2010, Diabetes Care, 2010, 33: (Suppl  1).    Recent Results (from the past 240 hour(s))  Blood Culture (routine x 2)     Status: None (Preliminary result)   Collection Time: 12/15/2018  4:31 PM  Result Value Ref Range Status   Specimen Description BLOOD SITE NOT SPECIFIED  Final   Special Requests   Final    BOTTLES DRAWN AEROBIC AND ANAEROBIC Blood Culture adequate volume   Culture   Final    NO GROWTH 3 DAYS Performed at [facility], [address] [place], [place] [address]    Report Status PENDING  Incomplete  Blood Culture (routine x 2)     Status: None (Preliminary result)   Collection Time: 12/26/2018  5:12 PM  Result Value Ref Range Status   Specimen Description BLOOD SITE NOT SPECIFIED  Final   Special Requests   Final    BOTTLES DRAWN AEROBIC AND ANAEROBIC Blood Culture results may not be optimal due to an inadequate volume of blood received in culture bottles   Culture   Final    NO GROWTH 3 DAYS Performed at [facility], [place] [address], [place], [place] [address]    Report Status PENDING  Incomplete  SARS Coronavirus 2 (CEPHEID- Performed  in [facility] hospital lab), Hosp Order     Status: Abnormal   Collection Time: 01/06/2019  5:28 PM  Result Value Ref Range Status   SARS Coronavirus 2 POSITIVE (A) NEGATIVE Final  Comment: RESULT CALLED TO, READ BACK BY AND VERIFIED WITH: S GRINDSTAFF RN 12/13/2018 1906 JDW (NOTE) If result is NEGATIVE SARS-CoV-2 target nucleic acids are NOT DETECTED. The SARS-CoV-2 RNA is generally detectable in upper and lower  respiratory specimens during the acute phase of infection. The lowest  concentration of SARS-CoV-2 viral copies this assay can detect is 250  copies / mL. A negative result does not preclude SARS-CoV-2 infection  and should not be used as the sole basis for treatment or other  patient management decisions.  A negative result may occur with  improper specimen collection / handling, submission of specimen other  than nasopharyngeal swab, presence of viral mutation(s) within the  areas targeted by this assay, and inadequate number of viral copies  (<250 copies / mL). A negative result must be combined with clinical  observations, patient history, and epidemiological information. If result is POSITIVE SARS-CoV-2 target nucleic acids are DETECTED. The  SARS-CoV-2 RNA is generally detectable in upper and lower  respiratory specimens during the acute phase of infection.  Positive  results are indicative of active infection with SARS-CoV-2.  Clinical  correlation with patient history and other diagnostic information is  necessary to determine patient infection status.  Positive results do  not rule out bacterial infection or co-infection with other viruses. If result is PRESUMPTIVE POSTIVE SARS-CoV-2 nucleic acids MAY BE PRESENT.   A presumptive positive result was obtained on the submitted specimen  and confirmed on repeat testing.  While 2019 novel coronavirus  (SARS-CoV-2) nucleic acids may be present in the submitted sample  additional confirmatory testing may be necessary for  epidemiological  and / or clinical management purposes  to differentiate between  SARS-CoV-2 and other Sarbecovirus currently known to infect humans.  If clinically indicated additional testing with an alternate test  methodology [phone]) is ad vised. The SARS-CoV-2 RNA is generally  detectable in upper and lower respiratory specimens during the acute  phase of infection. The expected result is Negative. Fact Sheet for Patients:  StrictlyIdeas.no Fact Sheet for Healthcare Providers: BankingDealers.co.za This test is not yet approved or cleared by the [place] FDA and has been authorized for detection and/or diagnosis of SARS-CoV-2 by FDA under an Emergency Use Authorization (EUA).  This EUA will remain in effect (meaning this test can be used) for the duration of the COVID-19 declaration under Section 564(b)(1) of the Act, 21 U.S.C. section 360bbb-3(b)(1), unless the authorization is terminated or revoked sooner. Performed at [facility], [place] [address] [place], [place] [address]      Scheduled Meds: . atorvastatin  40 mg Oral QPM  . chlorhexidine  15 mL Mouth Rinse BID  . mouth rinse  15 mL Mouth Rinse q12n4p  . methylPREDNISolone (SOLU-MEDROL) injection  40 mg Intravenous Q8H  . sodium chloride flush  3 mL Intravenous Q12H  . vitamin C  500 mg Oral Daily  . zinc sulfate  220 mg Oral Daily     LOS: 4 days   Cherene Altes, MD [facility] Office  [phone] Pager - Text Page per Amion  If 7PM-7AM, please contact night-coverage per Amion 12/31/2018, 9:06 AM

## 2019-01-01 LAB — COMPREHENSIVE METABOLIC PANEL WITH GFR
ALT: 20 U/L (ref 0–44)
AST: 76 U/L — ABNORMAL HIGH (ref 15–41)
Albumin: 2.7 g/dL — ABNORMAL LOW (ref 3.5–5.0)
Alkaline Phosphatase: 159 U/L — ABNORMAL HIGH (ref 38–126)
Anion gap: 11 (ref 5–15)
BUN: 68 mg/dL — ABNORMAL HIGH (ref 8–23)
CO2: 21 mmol/L — ABNORMAL LOW (ref 22–32)
Calcium: 8.2 mg/dL — ABNORMAL LOW (ref 8.9–10.3)
Chloride: 118 mmol/L — ABNORMAL HIGH (ref 98–111)
Creatinine, Ser: 2.34 mg/dL — ABNORMAL HIGH (ref 0.61–1.24)
GFR calc Af Amer: 27 mL/min — ABNORMAL LOW
GFR calc non Af Amer: 23 mL/min — ABNORMAL LOW
Glucose, Bld: 137 mg/dL — ABNORMAL HIGH (ref 70–99)
Potassium: 4.4 mmol/L (ref 3.5–5.1)
Sodium: 150 mmol/L — ABNORMAL HIGH (ref 135–145)
Total Bilirubin: 1.8 mg/dL — ABNORMAL HIGH (ref 0.3–1.2)
Total Protein: 6.6 g/dL (ref 6.5–8.1)

## 2019-01-01 LAB — CULTURE, BLOOD (ROUTINE X 2)
Culture: NO GROWTH
Culture: NO GROWTH
Special Requests: ADEQUATE

## 2019-01-01 LAB — CBC WITH DIFFERENTIAL/PLATELET
Abs Immature Granulocytes: 0.07 K/uL (ref 0.00–0.07)
Basophils Absolute: 0 K/uL (ref 0.0–0.1)
Basophils Relative: 0 %
Eosinophils Absolute: 0.1 K/uL (ref 0.0–0.5)
Eosinophils Relative: 1 %
HCT: 50.2 % (ref 39.0–52.0)
Hemoglobin: 16 g/dL (ref 13.0–17.0)
Immature Granulocytes: 1 %
Lymphocytes Relative: 3 %
Lymphs Abs: 0.3 K/uL — ABNORMAL LOW (ref 0.7–4.0)
MCH: 32.1 pg (ref 26.0–34.0)
MCHC: 31.9 g/dL (ref 30.0–36.0)
MCV: 100.6 fL — ABNORMAL HIGH (ref 80.0–100.0)
Monocytes Absolute: 0.2 K/uL (ref 0.1–1.0)
Monocytes Relative: 2 %
Neutro Abs: 8.8 K/uL — ABNORMAL HIGH (ref 1.7–7.7)
Neutrophils Relative %: 93 %
Platelets: 89 K/uL — ABNORMAL LOW (ref 150–400)
RBC: 4.99 MIL/uL (ref 4.22–5.81)
RDW: 16 % — ABNORMAL HIGH (ref 11.5–15.5)
WBC: 9.4 K/uL (ref 4.0–10.5)
nRBC: 0 % (ref 0.0–0.2)

## 2019-01-01 LAB — FERRITIN: Ferritin: 2064 ng/mL — ABNORMAL HIGH (ref 24–336)

## 2019-01-01 LAB — D-DIMER, QUANTITATIVE: D-Dimer, Quant: 2.03 ug/mL-FEU — ABNORMAL HIGH (ref 0.00–0.50)

## 2019-01-01 LAB — C-REACTIVE PROTEIN: CRP: 8.6 mg/dL — ABNORMAL HIGH

## 2019-01-01 MED ORDER — ACETAMINOPHEN 650 MG RE SUPP: 650.0000 mg | RECTAL | Status: DC | PRN

## 2019-01-01 NOTE — Progress Notes (Signed)
Updated Timothy Suarez as to patient's condition. All questions answered.

## 2019-01-01 NOTE — Progress Notes (Signed)
[facility] TEAM 1 - Stepdown/ICU TEAM  Timothy Suarez  SAY:[id number] DOB: 1924-09-30 DOA: 01/02/2019 PCP: Gayland Curry, DO    Brief Narrative:  [phone] w/ a hx of CAD, HTN, chronic atrial fibrillation s/p Maze procedure on Eliquis, systolic CHF (EF of 73%), CKD Stage III, MVP/severe mitral regurgitation, s/p laryngectomy due to laryngeal cancer, and RBB w/ a second-degree AV block type I who was transferred to the ED from [facility] w/ shortness of breath. He had been admitted 4/8 > 11/23/2018 for a L femoral fracture requiring an ORIF on 11/18/2018.   Significant Events: 5/19 admit from SNF   COVID-19 specific Treatment: Actemra 5/21 Steroids 5/20 > 5/23  Subjective: Remains unresponsive to my touch, exam, or voice. Does not appear uncomfortable, but is somewhat tahcypneic. ROS not possible due to AMS.  Assessment & Plan:  Acute hypoxic respiratory failure -COVID-19 pneumonia Continues to require significant oxygen support today - need more time to determine the trajectory of his clinical course -continue supportive care - does not appear to be improving at this time - discussed his condition w/ his daughter   Chronic systolic CHF -EF 22% No gross volume overload on physical exam -given lack of oral intake will cont to avoid diuretics for now - net + ~550cc this admit   Filed Weights   12/15/2018 1603 12/18/2018 1635 12/28/18 0115  Weight: 79 kg 79 kg 79 kg    Paroxysmal atrial fibrillation Rate controlled - anticoagulation on hold due to transient hemoptysis  Transient hemoptysis Appears to have resolved with discontinuation of Eliquis   Chronic kidney disease stage III with acute kidney injury Baseline creatinine reportedly approximately 1 - creatinine has not yet returned to baseline but does appear relatively stable - follow trend w/ recheck tomorrow   Recent Labs  Lab 12/15/2018 1631 12/29/18 0354 12/30/18 0717  CREATININE 1.54* 1.29* 1.48*    Severe mitral valve  regurgitation  Laryngeal cancer status post laryngectomy   DVT prophylaxis: SCDs Code Status: DNR - NO CODE Family Communication: spoke w/ daughter via phone   Disposition Plan:   Consultants:  None  Antimicrobials:  Cefepime 5/19 Vancomycin 5/19  Objective: Blood pressure 115/63, pulse 82, temperature 97.6 F (36.4 C), temperature source Axillary, resp. rate (!) 29, height 5\' 6"  (1.676 m), weight 79 kg, SpO2 99 %.  Intake/Output Summary (Last 24 hours) at 01/01/2019 0850 Last data filed at 01/01/2019 0405 Gross per 24 hour  Intake -  Output 250 ml  Net -250 ml   Filed Weights   01/01/2019 1603 12/28/2018 1635 12/28/18 0115  Weight: 79 kg 79 kg 79 kg    Examination: General: Requiring high level oxygen support - unresponsive today  Lungs: Fine crackles diffusely w/o change  Cardiovascular: RRR - no rub  Abdomen: Soft, bowel sounds hypoactive, no rebound, no mass Extremities: No edema B LE   CBC: Recent Labs  Lab 12/25/2018 1631  12/28/18 0832 12/29/18 0354 12/30/18 0717  WBC 3.9*  --   --  6.7 10.5  NEUTROABS 3.5  --   --  6.0 9.6*  HGB 13.1   < > 15.0 14.6 16.4  HCT 40.3   < > 44.0 46.7 50.4  MCV 98.1  --   --  100.9* 99.6  PLT 95*  --   --  117* 140*   < > = values in this interval not displayed.   Basic Metabolic Panel: Recent Labs  Lab 12/25/2018 1631  12/28/18 0832 12/29/18 0354 12/30/18  0717  NA 138   < > 142 144 146*  K 3.6   < > 3.7 4.0 4.1  CL 108  --   --  113* 114*  CO2 19*  --   --  23 19*  GLUCOSE 134*  --   --  111* 136*  BUN 23  --   --  29* 42*  CREATININE 1.54*  --   --  1.29* 1.48*  CALCIUM 7.6*  --   --  7.3* 7.8*  MG  --   --   --  2.1 2.6*  PHOS  --   --   --  4.3 3.9   < > = values in this interval not displayed.   GFR: Estimated Creatinine Clearance: 32.1 mL/min (A) (by C-G formula based on SCr of 1.48 mg/dL (H)).  Liver Function Tests: Recent Labs  Lab 12/11/2018 1631 12/29/18 0354 12/30/18 0717  AST 27 36 46*  ALT 11 11  14   ALKPHOS 106 114 126  BILITOT 1.5* 1.2 1.7*  PROT 5.3* 5.5* 6.2*  ALBUMIN 2.4* 2.3* 2.5*   Recent Labs  Lab 12/16/2018 1631  LIPASE 28    Cardiac Enzymes: Recent Labs  Lab 12/29/18 0354 12/30/18 0717  CKTOTAL 307 269    HbA1C: Hgb A1c MFr Bld  Date/Time Value Ref Range Status  08/11/2017 05:43 AM 5.5 4.8 - 5.6 % Final    Comment:    (NOTE) Pre diabetes:          5.7%-6.4% Diabetes:              >6.4% Glycemic control for   <7.0% adults with diabetes   12/10/202010 10:48 AM  4.6 - 6.1 % Final   5.3 (NOTE) The ADA recommends the following therapeutic goal for glycemic control related to Hgb A1c measurement: Goal of therapy: <6.5 Hgb A1c  Reference: American Diabetes Association: Clinical Practice Recommendations 2010, Diabetes Care, 2010, 33: (Suppl  1).    Recent Results (from the past 240 hour(s))  Blood Culture (routine x 2)     Status: None (Preliminary result)   Collection Time: 12/28/2018  4:31 PM  Result Value Ref Range Status   Specimen Description BLOOD SITE NOT SPECIFIED  Final   Special Requests   Final    BOTTLES DRAWN AEROBIC AND ANAEROBIC Blood Culture adequate volume   Culture   Final    NO GROWTH 4 DAYS Performed at [facility], [address], [place], [place] [address]    Report Status PENDING  Incomplete  Blood Culture (routine x 2)     Status: None (Preliminary result)   Collection Time: 12/23/2018  5:12 PM  Result Value Ref Range Status   Specimen Description BLOOD SITE NOT SPECIFIED  Final   Special Requests   Final    BOTTLES DRAWN AEROBIC AND ANAEROBIC Blood Culture results may not be optimal due to an inadequate volume of blood received in culture bottles   Culture   Final    NO GROWTH 4 DAYS Performed at [facility], [place] [address], [place], [place] [address]    Report Status PENDING  Incomplete  SARS Coronavirus 2 (CEPHEID- Performed in [facility] hospital lab), Hosp Order     Status: Abnormal   Collection Time: 12/13/2018   5:28 PM  Result Value Ref Range Status   SARS Coronavirus 2 POSITIVE (A) NEGATIVE Final    Comment: RESULT CALLED TO, READ BACK BY AND VERIFIED WITH: S GRINDSTAFF  RN 12/12/2018 1906 JDW (NOTE) If result is NEGATIVE SARS-CoV-2 target nucleic acids are NOT DETECTED. The SARS-CoV-2 RNA is generally detectable in upper and lower  respiratory specimens during the acute phase of infection. The lowest  concentration of SARS-CoV-2 viral copies this assay can detect is 250  copies / mL. A negative result does not preclude SARS-CoV-2 infection  and should not be used as the sole basis for treatment or other  patient management decisions.  A negative result may occur with  improper specimen collection / handling, submission of specimen other  than nasopharyngeal swab, presence of viral mutation(s) within the  areas targeted by this assay, and inadequate number of viral copies  (<250 copies / mL). A negative result must be combined with clinical  observations, patient history, and epidemiological information. If result is POSITIVE SARS-CoV-2 target nucleic acids are DETECTED. The  SARS-CoV-2 RNA is generally detectable in upper and lower  respiratory specimens during the acute phase of infection.  Positive  results are indicative of active infection with SARS-CoV-2.  Clinical  correlation with patient history and other diagnostic information is  necessary to determine patient infection status.  Positive results do  not rule out bacterial infection or co-infection with other viruses. If result is PRESUMPTIVE POSTIVE SARS-CoV-2 nucleic acids MAY BE PRESENT.   A presumptive positive result was obtained on the submitted specimen  and confirmed on repeat testing.  While 2019 novel coronavirus  (SARS-CoV-2) nucleic acids may be present in the submitted sample  additional confirmatory testing may be necessary for epidemiological  and / or clinical management purposes  to differentiate between   SARS-CoV-2 and other Sarbecovirus currently known to infect humans.  If clinically indicated additional testing with an alternate test  methodology [phone]) is ad vised. The SARS-CoV-2 RNA is generally  detectable in upper and lower respiratory specimens during the acute  phase of infection. The expected result is Negative. Fact Sheet for Patients:  StrictlyIdeas.no Fact Sheet for Healthcare Providers: BankingDealers.co.za This test is not yet approved or cleared by the [place] FDA and has been authorized for detection and/or diagnosis of SARS-CoV-2 by FDA under an Emergency Use Authorization (EUA).  This EUA will remain in effect (meaning this test can be used) for the duration of the COVID-19 declaration under Section 564(b)(1) of the Act, 21 U.S.C. section 360bbb-3(b)(1), unless the authorization is terminated or revoked sooner. Performed at [facility], [place] [address], [place], [place] [address]      Scheduled Meds: . atorvastatin  40 mg Oral QPM  . chlorhexidine  15 mL Mouth Rinse BID  . mouth rinse  15 mL Mouth Rinse q12n4p  . sodium chloride flush  3 mL Intravenous Q12H  . vitamin C  500 mg Oral Daily  . zinc sulfate  220 mg Oral Daily     LOS: 5 days   Cherene Altes, MD [facility] Office  [phone] Pager - Text Page per Amion  If 7PM-7AM, please contact night-coverage per Amion 01/01/2019, 8:50 AM

## 2019-01-02 LAB — COMPREHENSIVE METABOLIC PANEL
ALT: 20 U/L (ref 0–44)
AST: 70 U/L — ABNORMAL HIGH (ref 15–41)
Albumin: 2.4 g/dL — ABNORMAL LOW (ref 3.5–5.0)
Alkaline Phosphatase: 129 U/L — ABNORMAL HIGH (ref 38–126)
Anion gap: 13 (ref 5–15)
BUN: 78 mg/dL — ABNORMAL HIGH (ref 8–23)
CO2: 17 mmol/L — ABNORMAL LOW (ref 22–32)
Calcium: 7.9 mg/dL — ABNORMAL LOW (ref 8.9–10.3)
Chloride: 121 mmol/L — ABNORMAL HIGH (ref 98–111)
Creatinine, Ser: 2.54 mg/dL — ABNORMAL HIGH (ref 0.61–1.24)
GFR calc Af Amer: 25 mL/min — ABNORMAL LOW (ref >60)
GFR calc non Af Amer: 21 mL/min — ABNORMAL LOW (ref >60)
Glucose, Bld: 121 mg/dL — ABNORMAL HIGH (ref 70–99)
Potassium: 4.5 mmol/L (ref 3.5–5.1)
Sodium: 151 mmol/L — ABNORMAL HIGH (ref 135–145)
Total Bilirubin: 1.6 mg/dL — ABNORMAL HIGH (ref 0.3–1.2)
Total Protein: 5.8 g/dL — ABNORMAL LOW (ref 6.5–8.1)

## 2019-01-02 LAB — CBC WITH DIFFERENTIAL/PLATELET
Abs Immature Granulocytes: 0.04 10*3/uL (ref 0.00–0.07)
Basophils Absolute: 0 10*3/uL (ref 0.0–0.1)
Basophils Relative: 0 %
Eosinophils Absolute: 0.2 10*3/uL (ref 0.0–0.5)
Eosinophils Relative: 2 %
HCT: 46.5 % (ref 39.0–52.0)
Hemoglobin: 15.1 g/dL (ref 13.0–17.0)
Immature Granulocytes: 0 %
Lymphocytes Relative: 3 %
Lymphs Abs: 0.3 10*3/uL — ABNORMAL LOW (ref 0.7–4.0)
MCH: 32.4 pg (ref 26.0–34.0)
MCHC: 32.5 g/dL (ref 30.0–36.0)
MCV: 99.8 fL (ref 80.0–100.0)
Monocytes Absolute: 0.2 10*3/uL (ref 0.1–1.0)
Monocytes Relative: 2 %
Neutro Abs: 8.5 10*3/uL — ABNORMAL HIGH (ref 1.7–7.7)
Neutrophils Relative %: 93 %
Platelets: 71 10*3/uL — ABNORMAL LOW (ref 150–400)
RBC: 4.66 MIL/uL (ref 4.22–5.81)
RDW: 16.1 % — ABNORMAL HIGH (ref 11.5–15.5)
WBC: 9.2 10*3/uL (ref 4.0–10.5)
nRBC: 0 % (ref 0.0–0.2)

## 2019-01-02 LAB — FERRITIN: Ferritin: 918 ng/mL — ABNORMAL HIGH (ref 24–336)

## 2019-01-02 NOTE — Progress Notes (Signed)
Timothy Suarez - Stepdown/ICU TEAM  Timothy Suarez  [id number]:[id number] DOB: 18-May-1925 DOA: 12/10/2018 PCP: Gayland Curry, DO    Brief Narrative:  [phone] w/ a hx of CAD, HTN, chronic atrial fibrillation s/p Maze procedure on Eliquis, systolic CHF (EF of 16%), CKD Stage III, MVP/severe mitral regurgitation, s/p laryngectomy due to laryngeal cancer, and RBB w/ a second-degree AV block type I who was transferred to the ED from [facility] w/ shortness of breath. He had been admitted 4/8 > 11/23/2018 for a L femoral fracture requiring an ORIF on 11/18/2018.   Significant Events: 5/19 admit from SNF   COVID-19 specific Treatment: Actemra 5/21 Steroids 5/20 > 5/23  Subjective: The patient is entirely unresponsive.  He does not respond to physical contact or voice.  He does not respond to painful stimuli.  He is breathing rapidly but there is no evidence of respiratory distress or uncontrolled pain.  Assessment & Plan:  Acute hypoxic respiratory failure -COVID-19 pneumonia The patient appears to be clearly losing ground today -despite the fact that his vitals are "stable" on physical exam he has clearly begun the dying process -we will work to assure that he is comfortable - I judge his life expectancy to be 24-48 hours -we will not draw further labs  Recent Labs    01/01/19 0720  DDIMER 2.03*  FERRITIN 2,064*  CRP 8.6*    Chronic systolic CHF -EF 38% No gross volume overload on physical exam  Filed Weights   12/21/2018 1603 12/31/2018 1635 12/28/18 0115  Weight: 79 kg 79 kg 79 kg    Paroxysmal atrial fibrillation Rate controlled - anticoagulation on hold due to transient hemoptysis  Transient hemoptysis Appears to have resolved with discontinuation of Eliquis   Chronic kidney disease stage III with acute kidney injury Baseline creatinine reportedly approximately Suarez -labs today confirm that renal failure is progressing  Recent Labs  Lab 12/15/2018 1631 12/29/18 0354 12/30/18 0717  01/01/19 0720 01/02/19 0303  CREATININE Suarez.54* Suarez.29* Suarez.48* 2.34* 2.54*    Severe mitral valve regurgitation  Laryngeal cancer status post laryngectomy   DVT prophylaxis: SCDs Code Status: DNR - NO CODE Family Communication: spoke w/ daughter via phone   Disposition Plan:   Consultants:  None  Antimicrobials:  Cefepime 5/19 Vancomycin 5/19  Objective: Blood pressure (!) 98/53, pulse 80, temperature (!) 96.6 F (35.9 C), temperature source Axillary, resp. rate (!) 31, height 5\' 6"  (Suarez.676 m), weight 79 kg, SpO2 97 %.  Intake/Output Summary (Last 24 hours) at 01/02/2019 0906 Last data filed at 01/02/2019 0756 Gross per 24 hour  Intake -  Output 150 ml  Net -150 ml   Filed Weights   01/08/2019 1603 12/24/2018 1635 12/28/18 0115  Weight: 79 kg 79 kg 79 kg    Examination: General: Noncommunicative Lungs: Fine crackles diffusely w/ no wheezing Cardiovascular: RRR Abdomen: Soft, bowel sounds hypoactive, no rebound, no mass Extremities: No edema bilateral lower extremities  CBC: Recent Labs  Lab 12/30/18 0717 01/01/19 0720 01/02/19 0303  WBC 10.5 9.4 9.2  NEUTROABS 9.6* 8.8* 8.5*  HGB 16.4 16.0 15.Suarez  HCT 50.4 50.2 46.5  MCV 99.6 100.6* 99.8  PLT 140* 89* 71*   Basic Metabolic Panel: Recent Labs  Lab 12/29/18 0354 12/30/18 0717 01/01/19 0720 01/02/19 0303  NA 144 146* 150* 151*  K 4.0 4.Suarez 4.4 4.5  CL 113* 114* 118* 121*  CO2 23 19* 21* 17*  GLUCOSE 111* 136* 137* 121*  BUN 29* 42* 68*  78*  CREATININE Suarez.29* Suarez.48* 2.34* 2.54*  CALCIUM 7.3* 7.8* 8.2* 7.9*  MG 2.Suarez 2.6*  --   --   PHOS 4.3 3.9  --   --    GFR: Estimated Creatinine Clearance: 18.7 mL/min (A) (by C-G formula based on SCr of 2.54 mg/dL (H)).  Liver Function Tests: Recent Labs  Lab 12/29/18 0354 12/30/18 0717 01/01/19 0720 01/02/19 0303  AST 36 46* 76* 70*  ALT 11 14 20 20   ALKPHOS 114 126 159* 129*  BILITOT Suarez.2 Suarez.7* Suarez.8* Suarez.6*  PROT 5.5* 6.2* 6.6 5.8*  ALBUMIN 2.3* 2.5* 2.7* 2.4*    Recent Labs  Lab 01/07/2019 1631  LIPASE 28    Cardiac Enzymes: Recent Labs  Lab 12/29/18 0354 12/30/18 0717  CKTOTAL 307 269    HbA1C: Hgb A1c MFr Bld  Date/Time Value Ref Range Status  08/11/2017 05:43 AM 5.5 4.8 - 5.6 % Final    Comment:    (NOTE) Pre diabetes:          5.7%-6.4% Diabetes:              >6.4% Glycemic control for   <7.0% adults with diabetes   04/12/2009 10:48 AM  4.6 - 6.Suarez % Final   5.3 (NOTE) The ADA recommends the following therapeutic goal for glycemic control related to Hgb A1c measurement: Goal of therapy: <6.5 Hgb A1c  Reference: American Diabetes Association: Clinical Practice Recommendations 2010, Diabetes Care, 2010, 33: (Suppl  Suarez).    Recent Results (from the past 240 hour(s))  Blood Culture (routine x 2)     Status: None   Collection Time: 12/31/2018  4:31 PM  Result Value Ref Range Status   Specimen Description BLOOD SITE NOT SPECIFIED  Final   Special Requests   Final    BOTTLES DRAWN AEROBIC AND ANAEROBIC Blood Culture adequate volume   Culture   Final    NO GROWTH 5 DAYS Performed at [facility], [address], [place], [place] [address]    Report Status 01/01/2019 FINAL  Final  Blood Culture (routine x 2)     Status: None   Collection Time: 01/07/2019  5:12 PM  Result Value Ref Range Status   Specimen Description BLOOD SITE NOT SPECIFIED  Final   Special Requests   Final    BOTTLES DRAWN AEROBIC AND ANAEROBIC Blood Culture results may not be optimal due to an inadequate volume of blood received in culture bottles   Culture   Final    NO GROWTH 5 DAYS Performed at [facility], [place] [address] [place],  [address]    Report Status 01/01/2019 FINAL  Final  SARS Coronavirus 2 (CEPHEID- Performed in [place] hospital lab), Hosp Order     Status: Abnormal   Collection Time: 12/22/2018  5:28 PM  Result Value Ref Range Status   SARS Coronavirus 2 POSITIVE (A) NEGATIVE Final    Comment: RESULT CALLED TO, READ BACK  BY AND VERIFIED WITH: S GRINDSTAFF RN 12/12/2018 1906 JDW (NOTE) If result is NEGATIVE SARS-CoV-2 target nucleic acids are NOT DETECTED. The SARS-CoV-2 RNA is generally detectable in upper and lower  respiratory specimens during the acute phase of infection. The lowest  concentration of SARS-CoV-2 viral copies this assay can detect is 250  copies / mL. A negative result does not preclude SARS-CoV-2 infection  and should not be used as the sole basis for treatment or other  patient management decisions.  A negative result may occur  with  improper specimen collection / handling, submission of specimen other  than nasopharyngeal swab, presence of viral mutation(s) within the  areas targeted by this assay, and inadequate number of viral copies  (<250 copies / mL). A negative result must be combined with clinical  observations, patient history, and epidemiological information. If result is POSITIVE SARS-CoV-2 target nucleic acids are DETECTED. The  SARS-CoV-2 RNA is generally detectable in upper and lower  respiratory specimens during the acute phase of infection.  Positive  results are indicative of active infection with SARS-CoV-2.  Clinical  correlation with patient history and other diagnostic information is  necessary to determine patient infection status.  Positive results do  not rule out bacterial infection or co-infection with other viruses. If result is PRESUMPTIVE POSTIVE SARS-CoV-2 nucleic acids MAY BE PRESENT.   A presumptive positive result was obtained on the submitted specimen  and confirmed on repeat testing.  While 2019 novel coronavirus  (SARS-CoV-2) nucleic acids may be present in the submitted sample  additional confirmatory testing may be necessary for epidemiological  and / or clinical management purposes  to differentiate between  SARS-CoV-2 and other Sarbecovirus currently known to infect humans.  If clinically indicated additional testing with an alternate test   methodology [phone]) is ad vised. The SARS-CoV-2 RNA is generally  detectable in upper and lower respiratory specimens during the acute  phase of infection. The expected result is Negative. Fact Sheet for Patients:  StrictlyIdeas.no Fact Sheet for Healthcare Providers: BankingDealers.co.za This test is not yet approved or cleared by the [place] FDA and has been authorized for detection and/or diagnosis of SARS-CoV-2 by FDA under an Emergency Use Authorization (EUA).  This EUA will remain in effect (meaning this test can be used) for the duration of the COVID-19 declaration under Section 564(b)(Suarez) of the Act, 21 U.S.C. section 360bbb-3(b)(Suarez), unless the authorization is terminated or revoked sooner. Performed at [facility], [place] [address] [place], [place] [address]      Scheduled Meds: . chlorhexidine  15 mL Mouth Rinse BID  . mouth rinse  15 mL Mouth Rinse q12n4p  . sodium chloride flush  3 mL Intravenous Q12H     LOS: 6 days   Cherene Altes, MD [facility] Office  [phone] Pager - Text Page per Amion  If 7PM-7AM, please contact night-coverage per Amion 01/02/2019, 9:06 AM

## 2019-01-03 MED ORDER — MORPHINE SULFATE (PF) 2 MG/ML IV SOLN: 2.0000 mg | INTRAVENOUS | Status: DC | PRN

## 2019-01-03 MED ORDER — CHLORHEXIDINE GLUCONATE 0.12 % MT SOLN: 15.0000 mL | Freq: Two times a day (BID) | OROMUCOSAL | Status: DC | PRN

## 2019-01-03 MED ORDER — MORPHINE 100MG IN NS 100ML (1MG/ML) PREMIX INFUSION
1.0000 mg/h | INTRAVENOUS | Status: DC
  Administered 2019-01-03: 2 mg/h via INTRAVENOUS
  Filled 2019-01-03: qty 100

## 2019-01-03 MED ORDER — LORAZEPAM 2 MG/ML IJ SOLN: 1.0000 mg | INTRAMUSCULAR | Status: DC | PRN

## 2019-01-09 NOTE — Progress Notes (Signed)
Wasted 90 ML of morphine in the stericycle. Nurse Courtlanda RN.   Patient passed at 1.   Doctor notified and daughter called. Family stated that they were satisfied with the care we have given to her dad. Family will call tomorrow to set up patients cremation with the funeral home.

## 2019-01-09 NOTE — Progress Notes (Addendum)
Patient resting comfortably, no distress observed. Will continue to monitor.  1345: Patient was pulling oxygen off and grimacing. Gave patient 2mg  of Morphine as ordered. Will continue to monitor.   1806: Daughter can to visit patient for the last time, Daughter asked to keep him comfortable. Patients respirations in the 40's increased morphine rate to 8. Daughter thanked me

## 2019-01-09 NOTE — Progress Notes (Signed)
[place] TEAM 1 - Stepdown/ICU TEAM  Timothy Suarez  [id number]:[id number] DOB: Jul 20, 1925 DOA: 12/28/2018 PCP: Gayland Curry, DO    Brief Narrative:  [phone] w/ a hx of CAD, HTN, chronic atrial fibrillation s/p Maze procedure on Eliquis, systolic CHF (EF of 67%), CKD Stage III, MVP/severe mitral regurgitation, s/p laryngectomy due to laryngeal cancer, and RBB w/ a second-degree AV block type I who was transferred to the ED from [facility] w/ shortness of breath. He had been admitted 4/8 > 11/23/2018 for a L femoral fracture requiring an ORIF on 11/18/2018.   Significant Events: 5/19 admit from SNF   COVID-19 specific Treatment: Actemra 5/21 Steroids 5/20 > 5/23  Subjective: The patient remains entirely noncommunicative today.  He is tachypneic but does not appear to be uncomfortable.  There is no evidence of uncontrolled pain.  I called the patient's daughter and updated her on his condition.  I explained to her that he now appear to be actively dying but he was not struggling.  She voiced understanding and told me she knew that this is likely where things were headed.  We agreed that focusing on comfort was the most appropriate intervention at this time.  He is being transitioned to full comfort care only at this time and I anticipate that he would likely die within the next 24 to 48 hours.  Assessment & Plan:  Acute hypoxic respiratory failure -COVID-19 pneumonia Transition to full comfort care as discussed above  Chronic systolic CHF -EF 12% No gross volume overload on exam  Filed Weights   01/05/2019 1603 12/20/2018 1635 12/28/18 0115  Weight: 79 kg 79 kg 79 kg    Paroxysmal atrial fibrillation  Transient hemoptysis  Chronic kidney disease stage III with progressive acute kidney injury Labs yesterday confirmed that the patient's renal failure was worsening  Severe mitral valve regurgitation  Laryngeal cancer status post laryngectomy   DVT prophylaxis: SCDs Code Status: DNR -  NO CODE Family Communication: spoke w/ daughter via phone   Disposition Plan: Comfort focused care with hospital death expected  Consultants:  None  Antimicrobials:  Cefepime 5/19 Vancomycin 5/19  Objective: Blood pressure (!) 108/55, pulse 75, temperature (!) 96.5 F (35.8 C), temperature source Axillary, resp. rate (!) 26, height 5\' 6"  (1.676 m), weight 79 kg, SpO2 100 %.  Intake/Output Summary (Last 24 hours) at 20-Jan-2019 0839 Last data filed at 20-Jan-2019 0550 Gross per 24 hour  Intake 0 ml  Output 350 ml  Net -350 ml   Filed Weights   12/10/2018 1603 12/10/2018 1635 12/28/18 0115  Weight: 79 kg 79 kg 79 kg    Examination: General: Noncommunicative Lungs: Fine crackles diffusely Cardiovascular: RRR Abdomen: Soft, bowel sounds hypoactive, no rebound, no mass Extremities: No edema B LE   CBC: Recent Labs  Lab 12/30/18 0717 01/01/19 0720 01/02/19 0303  WBC 10.5 9.4 9.2  NEUTROABS 9.6* 8.8* 8.5*  HGB 16.4 16.0 15.1  HCT 50.4 50.2 46.5  MCV 99.6 100.6* 99.8  PLT 140* 89* 71*   Basic Metabolic Panel: Recent Labs  Lab 12/29/18 0354 12/30/18 0717 01/01/19 0720 01/02/19 0303  NA 144 146* 150* 151*  K 4.0 4.1 4.4 4.5  CL 113* 114* 118* 121*  CO2 23 19* 21* 17*  GLUCOSE 111* 136* 137* 121*  BUN 29* 42* 68* 78*  CREATININE 1.29* 1.48* 2.34* 2.54*  CALCIUM 7.3* 7.8* 8.2* 7.9*  MG 2.1 2.6*  --   --   PHOS 4.3 3.9  --   --  GFR: Estimated Creatinine Clearance: 18.7 mL/min (A) (by C-G formula based on SCr of 2.54 mg/dL (H)).  Liver Function Tests: Recent Labs  Lab 12/29/18 0354 12/30/18 0717 01/01/19 0720 01/02/19 0303  AST 36 46* 76* 70*  ALT 11 14 20 20   ALKPHOS 114 126 159* 129*  BILITOT 1.2 1.7* 1.8* 1.6*  PROT 5.5* 6.2* 6.6 5.8*  ALBUMIN 2.3* 2.5* 2.7* 2.4*   Recent Labs  Lab 12/20/2018 1631  LIPASE 28    Cardiac Enzymes: Recent Labs  Lab 12/29/18 0354 12/30/18 0717  CKTOTAL 307 269    HbA1C: Hgb A1c MFr Bld  Date/Time Value Ref  Range Status  08/11/2017 05:43 AM 5.5 4.8 - 5.6 % Final    Comment:    (NOTE) Pre diabetes:          5.7%-6.4% Diabetes:              >6.4% Glycemic control for   <7.0% adults with diabetes   2020-11-2208 10:48 AM  4.6 - 6.1 % Final   5.3 (NOTE) The ADA recommends the following therapeutic goal for glycemic control related to Hgb A1c measurement: Goal of therapy: <6.5 Hgb A1c  Reference: American Diabetes Association: Clinical Practice Recommendations 2010, Diabetes Care, 2010, 33: (Suppl  1).     Scheduled Meds: . chlorhexidine  15 mL Mouth Rinse BID  . mouth rinse  15 mL Mouth Rinse q12n4p  . sodium chloride flush  3 mL Intravenous Q12H     LOS: 7 days   Cherene Altes, MD [facility] Office  [phone] Pager - Text Page per Amion  If 7PM-7AM, please contact night-coverage per Amion 01-14-19, 8:39 AM

## 2019-01-09 DEATH — deceased

## 2019-01-18 ENCOUNTER — Ambulatory Visit: Payer: Medicare Other | Admitting: Orthopaedic Surgery

## 2019-02-08 NOTE — Discharge Summary (Addendum)
   Death Summary   Timothy Suarez KZS:[id number] DOB: 10/21/1924 DOA: 01/15/19  PCP: Gayland Curry, DO  Admit date: 01/15/2019 Date of Death: 01-22-19  Final Diagnoses:  Principal Problem:   COVID-19 virus infection Active Problems:   Paroxysmal atrial fibrillation (HCC)   HTN (hypertension)   Coronary artery disease   S/P laryngectomy   Chronic systolic congestive heart failure (HCC)   Thrombocytopenia (HCC)   Multifocal pneumonia   CKD (chronic kidney disease) stage 3, GFR 30-59 ml/min (HCC)   Acute on chronic respiratory failure with hypoxia (HCC)  History of present illness:  85yo w/ a hx of CAD, HTN, chronic atrial fibrillation s/p Maze procedure on Eliquis, systolic CHF (EF of 73%), CKD Stage III, MVP/severe mitral regurgitation, s/p laryngectomy due to laryngeal cancer, and RBB w/ a second-degree AV block type I who was transferred to the ED from [facility] w/ shortness of breath. He had been admitted 4/8 > 11/23/2018 for a L femoral fracture requiring an ORIF on 11/18/2018.   Hospital Course:  The patient was admitted to the acute units with a new diagnosis of COVID pneumonia.  He was treated with conservative medical care but failed to show evidence of improvement.  Given his multiple comorbidities it was felt that the likelihood of him making a meaningful recovery was extremely limited.  The patient's power of attorney was contacted.  She was very understanding and agreed that he would not wish to undergo aggressive medical therapy.  The decision was made to transition to comfort focused care.  The patient was carefully attended to by his nurse for the remainder of his hospital stay.  He died peacefully on 22-Jan-2023 at 19:03.     Signed:  Cherene Altes  [facility] 01/25/2019, 4:51 PM

## 2019-03-13 ENCOUNTER — Ambulatory Visit: Payer: Self-pay | Admitting: Internal Medicine

## 2019-05-05 ENCOUNTER — Encounter: Payer: Self-pay | Admitting: Family

## 2019-05-05 ENCOUNTER — Ambulatory Visit: Payer: Self-pay

## 2019-08-22 IMAGING — DX PORTABLE CHEST - 1 VIEW
1 series · 1 of 1 positions shown · non-contrast
Comparison: 11/21/2018, 11/16/2018

CLINICAL DATA: Mental status change

EXAM:
PORTABLE CHEST 1 VIEW

[chest]
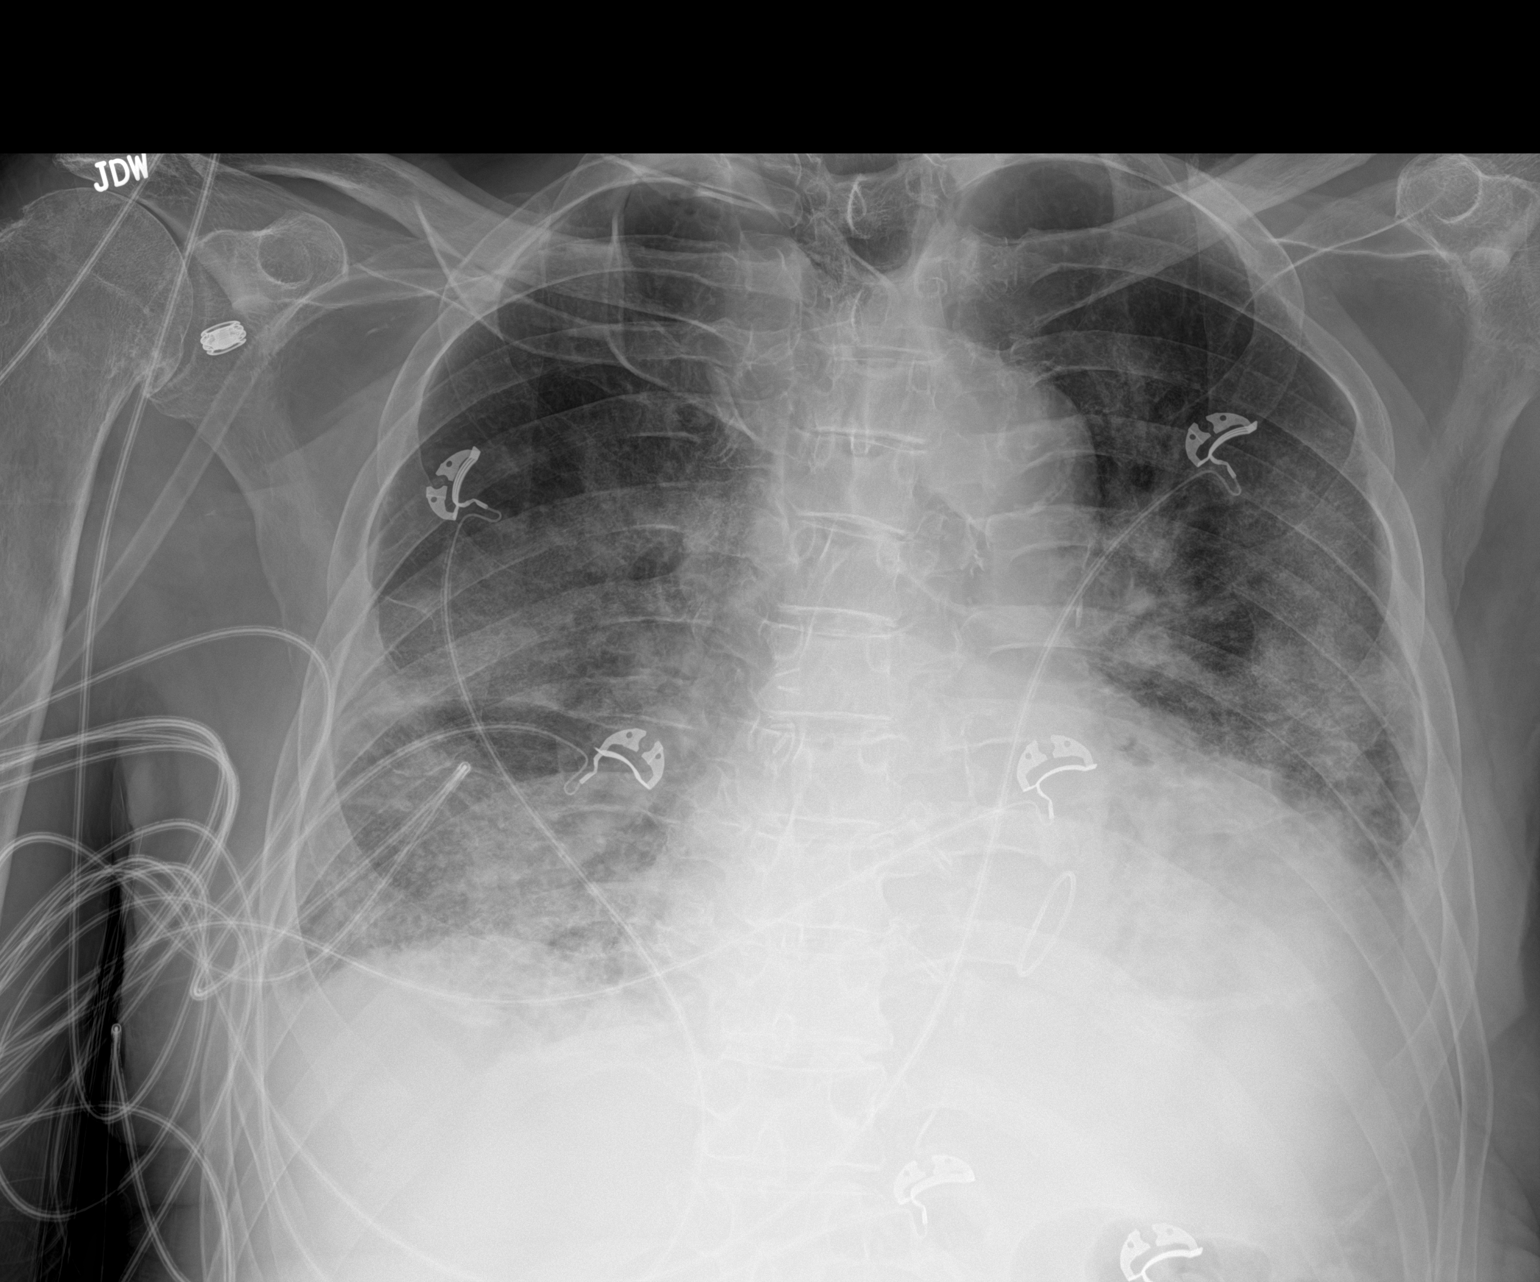

[1 of 1 positions shown; findings below may reference images not displayed]

FINDINGS: Cardiomegaly with small left greater than right pleural effusions.
Multifocal bilateral ground-glass opacity with consolidative process
at the left base. Aortic atherosclerosis. No pneumothorax.
IMPRESSION: 1. Development of multifocal bilateral ground-glass opacity,
suspicious for pneumonia, could consider atypical or viral pneumonia
in the appropriate clinical setting.
2. Cardiomegaly with suspected small pleural effusion. Persistent
dense consolidation at the left lung base.
